# Patient Record
Sex: Male | Born: 1945
Health system: Southern US, Community
[De-identification: ages and names within clinical notes are randomized; demographics above are authoritative.]

## PROBLEM LIST (undated history)

## (undated) DIAGNOSIS — E118 Type 2 diabetes mellitus with unspecified complications: Secondary | ICD-10-CM

## (undated) DIAGNOSIS — M1990 Unspecified osteoarthritis, unspecified site: Secondary | ICD-10-CM

## (undated) DIAGNOSIS — R55 Syncope and collapse: Secondary | ICD-10-CM

## (undated) DIAGNOSIS — Z9981 Dependence on supplemental oxygen: Secondary | ICD-10-CM

## (undated) DIAGNOSIS — Z9289 Personal history of other medical treatment: Secondary | ICD-10-CM

## (undated) DIAGNOSIS — K219 Gastro-esophageal reflux disease without esophagitis: Secondary | ICD-10-CM

## (undated) DIAGNOSIS — Z9889 Other specified postprocedural states: Secondary | ICD-10-CM

## (undated) DIAGNOSIS — M109 Gout, unspecified: Secondary | ICD-10-CM

## (undated) DIAGNOSIS — E1140 Type 2 diabetes mellitus with diabetic neuropathy, unspecified: Secondary | ICD-10-CM

## (undated) DIAGNOSIS — I10 Essential (primary) hypertension: Secondary | ICD-10-CM

## (undated) DIAGNOSIS — E039 Hypothyroidism, unspecified: Secondary | ICD-10-CM

## (undated) DIAGNOSIS — R609 Edema, unspecified: Secondary | ICD-10-CM

## (undated) DIAGNOSIS — F329 Major depressive disorder, single episode, unspecified: Secondary | ICD-10-CM

## (undated) DIAGNOSIS — A770 Spotted fever due to Rickettsia rickettsii: Secondary | ICD-10-CM

## (undated) DIAGNOSIS — I5189 Other ill-defined heart diseases: Secondary | ICD-10-CM

## (undated) DIAGNOSIS — G4733 Obstructive sleep apnea (adult) (pediatric): Secondary | ICD-10-CM

## (undated) DIAGNOSIS — F32A Depression, unspecified: Secondary | ICD-10-CM

## (undated) DIAGNOSIS — N200 Calculus of kidney: Secondary | ICD-10-CM

## (undated) DIAGNOSIS — N281 Cyst of kidney, acquired: Secondary | ICD-10-CM

## (undated) DIAGNOSIS — D696 Thrombocytopenia, unspecified: Secondary | ICD-10-CM

## (undated) HISTORY — DX: Calculus of kidney: N20.0

## (undated) HISTORY — DX: Cyst of kidney, acquired: N28.1

## (undated) HISTORY — DX: Other ill-defined heart diseases: I51.89

## (undated) HISTORY — DX: Other specified postprocedural states: Z98.890

## (undated) HISTORY — DX: Type 2 diabetes mellitus with unspecified complications: E11.8

## (undated) HISTORY — DX: Spotted fever due to Rickettsia rickettsii: A77.0

## (undated) HISTORY — DX: Depression, unspecified: F32.A

## (undated) HISTORY — DX: Type 2 diabetes mellitus with diabetic neuropathy, unspecified: E11.40

## (undated) HISTORY — DX: Gout, unspecified: M10.9

## (undated) HISTORY — DX: Thrombocytopenia, unspecified: D69.6

## (undated) HISTORY — DX: Syncope and collapse: R55

## (undated) HISTORY — DX: Major depressive disorder, single episode, unspecified: F32.9

## (undated) HISTORY — DX: Obstructive sleep apnea (adult) (pediatric): G47.33

## (undated) HISTORY — PX: ANAL FISSURE REPAIR: SHX2312

## (undated) HISTORY — DX: Essential (primary) hypertension: I10

## (undated) HISTORY — DX: Personal history of other medical treatment: Z92.89

## (undated) HISTORY — PX: FINGER AMPUTATION: SHX636

---

## 1975-04-15 HISTORY — PX: BACK SURGERY: SHX140

## 1991-04-15 HISTORY — PX: KNEE SURGERY: SHX244

## 1996-04-14 DIAGNOSIS — Z9889 Other specified postprocedural states: Secondary | ICD-10-CM

## 1996-04-14 HISTORY — PX: SHOULDER SURGERY: SHX246

## 1996-04-14 HISTORY — PX: CARDIAC CATHETERIZATION: SHX172

## 1996-04-14 HISTORY — DX: Other specified postprocedural states: Z98.890

## 1997-12-08 ENCOUNTER — Encounter: Payer: Self-pay | Admitting: Internal Medicine

## 1997-12-08 ENCOUNTER — Ambulatory Visit (HOSPITAL_COMMUNITY): Admission: RE | Admit: 1997-12-08 | Discharge: 1997-12-08 | Payer: Self-pay | Admitting: Internal Medicine

## 2003-01-05 LAB — HM COLONOSCOPY

## 2006-06-24 ENCOUNTER — Ambulatory Visit (HOSPITAL_COMMUNITY): Admission: RE | Admit: 2006-06-24 | Discharge: 2006-06-24 | Payer: Self-pay | Admitting: Internal Medicine

## 2006-06-24 ENCOUNTER — Encounter: Payer: Self-pay | Admitting: Vascular Surgery

## 2006-06-24 ENCOUNTER — Ambulatory Visit: Payer: Self-pay | Admitting: Vascular Surgery

## 2008-07-11 ENCOUNTER — Ambulatory Visit (HOSPITAL_COMMUNITY): Admission: RE | Admit: 2008-07-11 | Discharge: 2008-07-11 | Payer: Self-pay | Admitting: Internal Medicine

## 2008-09-21 ENCOUNTER — Encounter: Admission: RE | Admit: 2008-09-21 | Discharge: 2008-11-13 | Payer: Self-pay | Admitting: Orthopaedic Surgery

## 2009-02-22 ENCOUNTER — Ambulatory Visit (HOSPITAL_COMMUNITY): Admission: RE | Admit: 2009-02-22 | Discharge: 2009-02-22 | Payer: Self-pay | Admitting: Internal Medicine

## 2009-11-21 ENCOUNTER — Encounter: Admission: RE | Admit: 2009-11-21 | Discharge: 2009-11-21 | Payer: Self-pay | Admitting: Internal Medicine

## 2010-05-23 LAB — HM DIABETES EYE EXAM

## 2010-10-29 ENCOUNTER — Other Ambulatory Visit: Payer: Self-pay | Admitting: Orthopaedic Surgery

## 2010-10-29 DIAGNOSIS — M545 Low back pain, unspecified: Secondary | ICD-10-CM

## 2010-10-31 ENCOUNTER — Ambulatory Visit
Admission: RE | Admit: 2010-10-31 | Discharge: 2010-10-31 | Disposition: A | Discharge status: Home / Self Care | Payer: Medicare Other | Source: Ambulatory Visit | Attending: Orthopaedic Surgery | Admitting: Orthopaedic Surgery

## 2010-10-31 DIAGNOSIS — M545 Low back pain, unspecified: Secondary | ICD-10-CM

## 2011-04-24 DIAGNOSIS — E291 Testicular hypofunction: Secondary | ICD-10-CM | POA: Diagnosis not present

## 2011-05-08 DIAGNOSIS — E291 Testicular hypofunction: Secondary | ICD-10-CM | POA: Diagnosis not present

## 2011-05-21 DIAGNOSIS — I10 Essential (primary) hypertension: Secondary | ICD-10-CM | POA: Diagnosis not present

## 2011-05-21 DIAGNOSIS — E782 Mixed hyperlipidemia: Secondary | ICD-10-CM | POA: Diagnosis not present

## 2011-05-21 DIAGNOSIS — E559 Vitamin D deficiency, unspecified: Secondary | ICD-10-CM | POA: Diagnosis not present

## 2011-05-21 DIAGNOSIS — Z79899 Other long term (current) drug therapy: Secondary | ICD-10-CM | POA: Diagnosis not present

## 2011-05-21 DIAGNOSIS — E119 Type 2 diabetes mellitus without complications: Secondary | ICD-10-CM | POA: Diagnosis not present

## 2011-06-04 DIAGNOSIS — E291 Testicular hypofunction: Secondary | ICD-10-CM | POA: Diagnosis not present

## 2011-06-19 DIAGNOSIS — E291 Testicular hypofunction: Secondary | ICD-10-CM | POA: Diagnosis not present

## 2011-07-03 DIAGNOSIS — E291 Testicular hypofunction: Secondary | ICD-10-CM | POA: Diagnosis not present

## 2011-07-17 DIAGNOSIS — E291 Testicular hypofunction: Secondary | ICD-10-CM | POA: Diagnosis not present

## 2011-07-31 DIAGNOSIS — E291 Testicular hypofunction: Secondary | ICD-10-CM | POA: Diagnosis not present

## 2011-08-14 DIAGNOSIS — E291 Testicular hypofunction: Secondary | ICD-10-CM | POA: Diagnosis not present

## 2011-09-02 DIAGNOSIS — Z79899 Other long term (current) drug therapy: Secondary | ICD-10-CM | POA: Diagnosis not present

## 2011-09-02 DIAGNOSIS — E119 Type 2 diabetes mellitus without complications: Secondary | ICD-10-CM | POA: Diagnosis not present

## 2011-09-02 DIAGNOSIS — E782 Mixed hyperlipidemia: Secondary | ICD-10-CM | POA: Diagnosis not present

## 2011-09-02 DIAGNOSIS — N529 Male erectile dysfunction, unspecified: Secondary | ICD-10-CM | POA: Diagnosis not present

## 2011-09-02 DIAGNOSIS — M109 Gout, unspecified: Secondary | ICD-10-CM | POA: Diagnosis not present

## 2011-09-02 DIAGNOSIS — E559 Vitamin D deficiency, unspecified: Secondary | ICD-10-CM | POA: Diagnosis not present

## 2011-09-02 DIAGNOSIS — I10 Essential (primary) hypertension: Secondary | ICD-10-CM | POA: Diagnosis not present

## 2011-09-18 DIAGNOSIS — E291 Testicular hypofunction: Secondary | ICD-10-CM | POA: Diagnosis not present

## 2011-10-01 DIAGNOSIS — E291 Testicular hypofunction: Secondary | ICD-10-CM | POA: Diagnosis not present

## 2011-10-01 DIAGNOSIS — E782 Mixed hyperlipidemia: Secondary | ICD-10-CM | POA: Diagnosis not present

## 2011-10-01 DIAGNOSIS — I10 Essential (primary) hypertension: Secondary | ICD-10-CM | POA: Diagnosis not present

## 2011-10-21 DIAGNOSIS — E291 Testicular hypofunction: Secondary | ICD-10-CM | POA: Diagnosis not present

## 2011-11-03 DIAGNOSIS — E291 Testicular hypofunction: Secondary | ICD-10-CM | POA: Diagnosis not present

## 2011-11-20 DIAGNOSIS — Z79899 Other long term (current) drug therapy: Secondary | ICD-10-CM | POA: Diagnosis not present

## 2011-11-20 DIAGNOSIS — Z23 Encounter for immunization: Secondary | ICD-10-CM | POA: Diagnosis not present

## 2011-11-20 DIAGNOSIS — Z125 Encounter for screening for malignant neoplasm of prostate: Secondary | ICD-10-CM | POA: Diagnosis not present

## 2011-11-20 DIAGNOSIS — E559 Vitamin D deficiency, unspecified: Secondary | ICD-10-CM | POA: Diagnosis not present

## 2011-11-20 DIAGNOSIS — E119 Type 2 diabetes mellitus without complications: Secondary | ICD-10-CM | POA: Diagnosis not present

## 2011-11-20 DIAGNOSIS — N529 Male erectile dysfunction, unspecified: Secondary | ICD-10-CM | POA: Diagnosis not present

## 2011-11-20 DIAGNOSIS — Z2089 Contact with and (suspected) exposure to other communicable diseases: Secondary | ICD-10-CM | POA: Diagnosis not present

## 2011-11-20 DIAGNOSIS — I10 Essential (primary) hypertension: Secondary | ICD-10-CM | POA: Diagnosis not present

## 2011-11-20 DIAGNOSIS — E291 Testicular hypofunction: Secondary | ICD-10-CM | POA: Diagnosis not present

## 2011-11-20 DIAGNOSIS — Z1212 Encounter for screening for malignant neoplasm of rectum: Secondary | ICD-10-CM | POA: Diagnosis not present

## 2011-11-20 DIAGNOSIS — E782 Mixed hyperlipidemia: Secondary | ICD-10-CM | POA: Diagnosis not present

## 2011-12-04 DIAGNOSIS — E291 Testicular hypofunction: Secondary | ICD-10-CM | POA: Diagnosis not present

## 2011-12-23 DIAGNOSIS — E291 Testicular hypofunction: Secondary | ICD-10-CM | POA: Diagnosis not present

## 2011-12-29 DIAGNOSIS — IMO0002 Reserved for concepts with insufficient information to code with codable children: Secondary | ICD-10-CM | POA: Diagnosis not present

## 2011-12-29 DIAGNOSIS — M48061 Spinal stenosis, lumbar region without neurogenic claudication: Secondary | ICD-10-CM | POA: Diagnosis not present

## 2012-01-06 DIAGNOSIS — E291 Testicular hypofunction: Secondary | ICD-10-CM | POA: Diagnosis not present

## 2012-01-20 DIAGNOSIS — I10 Essential (primary) hypertension: Secondary | ICD-10-CM | POA: Diagnosis not present

## 2012-01-20 DIAGNOSIS — E559 Vitamin D deficiency, unspecified: Secondary | ICD-10-CM | POA: Diagnosis not present

## 2012-01-20 DIAGNOSIS — Z79899 Other long term (current) drug therapy: Secondary | ICD-10-CM | POA: Diagnosis not present

## 2012-01-20 DIAGNOSIS — E291 Testicular hypofunction: Secondary | ICD-10-CM | POA: Diagnosis not present

## 2012-01-20 DIAGNOSIS — M109 Gout, unspecified: Secondary | ICD-10-CM | POA: Diagnosis not present

## 2012-01-20 DIAGNOSIS — Z23 Encounter for immunization: Secondary | ICD-10-CM | POA: Diagnosis not present

## 2012-01-20 DIAGNOSIS — E119 Type 2 diabetes mellitus without complications: Secondary | ICD-10-CM | POA: Diagnosis not present

## 2012-01-20 DIAGNOSIS — E782 Mixed hyperlipidemia: Secondary | ICD-10-CM | POA: Diagnosis not present

## 2012-01-23 DIAGNOSIS — E119 Type 2 diabetes mellitus without complications: Secondary | ICD-10-CM | POA: Diagnosis not present

## 2012-01-23 DIAGNOSIS — G609 Hereditary and idiopathic neuropathy, unspecified: Secondary | ICD-10-CM | POA: Diagnosis not present

## 2012-01-23 DIAGNOSIS — E039 Hypothyroidism, unspecified: Secondary | ICD-10-CM | POA: Diagnosis not present

## 2012-01-23 DIAGNOSIS — I10 Essential (primary) hypertension: Secondary | ICD-10-CM | POA: Diagnosis not present

## 2012-01-29 DIAGNOSIS — G5600 Carpal tunnel syndrome, unspecified upper limb: Secondary | ICD-10-CM | POA: Diagnosis not present

## 2012-01-29 DIAGNOSIS — G609 Hereditary and idiopathic neuropathy, unspecified: Secondary | ICD-10-CM | POA: Diagnosis not present

## 2012-01-29 DIAGNOSIS — R209 Unspecified disturbances of skin sensation: Secondary | ICD-10-CM | POA: Diagnosis not present

## 2012-02-03 DIAGNOSIS — E291 Testicular hypofunction: Secondary | ICD-10-CM | POA: Diagnosis not present

## 2012-02-16 DIAGNOSIS — E291 Testicular hypofunction: Secondary | ICD-10-CM | POA: Diagnosis not present

## 2012-02-16 DIAGNOSIS — I10 Essential (primary) hypertension: Secondary | ICD-10-CM | POA: Diagnosis not present

## 2012-03-01 DIAGNOSIS — E291 Testicular hypofunction: Secondary | ICD-10-CM | POA: Diagnosis not present

## 2012-03-15 DIAGNOSIS — J0100 Acute maxillary sinusitis, unspecified: Secondary | ICD-10-CM | POA: Diagnosis not present

## 2012-03-23 ENCOUNTER — Emergency Department: Payer: Self-pay | Admitting: Emergency Medicine

## 2012-03-23 DIAGNOSIS — N134 Hydroureter: Secondary | ICD-10-CM | POA: Diagnosis not present

## 2012-03-23 DIAGNOSIS — N1330 Unspecified hydronephrosis: Secondary | ICD-10-CM | POA: Diagnosis not present

## 2012-03-23 DIAGNOSIS — J029 Acute pharyngitis, unspecified: Secondary | ICD-10-CM | POA: Diagnosis not present

## 2012-03-23 DIAGNOSIS — R0602 Shortness of breath: Secondary | ICD-10-CM | POA: Diagnosis not present

## 2012-03-23 DIAGNOSIS — I10 Essential (primary) hypertension: Secondary | ICD-10-CM | POA: Diagnosis not present

## 2012-03-23 DIAGNOSIS — R110 Nausea: Secondary | ICD-10-CM | POA: Diagnosis not present

## 2012-03-23 DIAGNOSIS — E039 Hypothyroidism, unspecified: Secondary | ICD-10-CM | POA: Diagnosis not present

## 2012-03-23 DIAGNOSIS — N200 Calculus of kidney: Secondary | ICD-10-CM | POA: Diagnosis not present

## 2012-03-23 DIAGNOSIS — E119 Type 2 diabetes mellitus without complications: Secondary | ICD-10-CM | POA: Diagnosis not present

## 2012-03-23 DIAGNOSIS — Z79899 Other long term (current) drug therapy: Secondary | ICD-10-CM | POA: Diagnosis not present

## 2012-03-24 DIAGNOSIS — N1330 Unspecified hydronephrosis: Secondary | ICD-10-CM | POA: Diagnosis not present

## 2012-03-24 DIAGNOSIS — N134 Hydroureter: Secondary | ICD-10-CM | POA: Diagnosis not present

## 2012-03-24 DIAGNOSIS — N201 Calculus of ureter: Secondary | ICD-10-CM | POA: Diagnosis not present

## 2012-03-24 DIAGNOSIS — N200 Calculus of kidney: Secondary | ICD-10-CM | POA: Diagnosis not present

## 2012-03-24 LAB — CBC
HCT: 42 % (ref 40.0–52.0)
HGB: 13.7 g/dL (ref 13.0–18.0)
MCH: 29.2 pg (ref 26.0–34.0)
MCHC: 32.6 g/dL (ref 32.0–36.0)
MCV: 90 fL (ref 80–100)
Platelet: 173 10*3/uL (ref 150–440)
RBC: 4.69 10*6/uL (ref 4.40–5.90)
RDW: 15.2 % — ABNORMAL HIGH (ref 11.5–14.5)
WBC: 8.7 10*3/uL (ref 3.8–10.6)

## 2012-03-24 LAB — COMPREHENSIVE METABOLIC PANEL
Albumin: 4 g/dL (ref 3.4–5.0)
Alkaline Phosphatase: 72 U/L (ref 50–136)
Anion Gap: 8 (ref 7–16)
BUN: 21 mg/dL — ABNORMAL HIGH (ref 7–18)
Bilirubin,Total: 0.8 mg/dL (ref 0.2–1.0)
Calcium, Total: 9.6 mg/dL (ref 8.5–10.1)
Chloride: 102 mmol/L (ref 98–107)
Co2: 29 mmol/L (ref 21–32)
Creatinine: 2.08 mg/dL — ABNORMAL HIGH (ref 0.60–1.30)
EGFR (African American): 37 — ABNORMAL LOW
EGFR (Non-African Amer.): 32 — ABNORMAL LOW
Glucose: 199 mg/dL — ABNORMAL HIGH (ref 65–99)
Osmolality: 286 (ref 275–301)
Potassium: 4.2 mmol/L (ref 3.5–5.1)
SGOT(AST): 25 U/L (ref 15–37)
SGPT (ALT): 24 U/L (ref 12–78)
Sodium: 139 mmol/L (ref 136–145)
Total Protein: 7 g/dL (ref 6.4–8.2)

## 2012-03-24 LAB — URINALYSIS, COMPLETE
Bilirubin,UR: NEGATIVE
Blood: NEGATIVE
Glucose,UR: 50 mg/dL (ref 0–75)
Nitrite: NEGATIVE
Ph: 6 (ref 4.5–8.0)
Protein: NEGATIVE
RBC,UR: 2 /HPF (ref 0–5)
Specific Gravity: 1.029 (ref 1.003–1.030)
Squamous Epithelial: NONE SEEN
WBC UR: 3 /HPF (ref 0–5)

## 2012-03-24 LAB — LIPASE, BLOOD: Lipase: 388 U/L (ref 73–393)

## 2012-03-30 DIAGNOSIS — E291 Testicular hypofunction: Secondary | ICD-10-CM | POA: Diagnosis not present

## 2012-04-27 DIAGNOSIS — E291 Testicular hypofunction: Secondary | ICD-10-CM | POA: Diagnosis not present

## 2012-04-27 DIAGNOSIS — N529 Male erectile dysfunction, unspecified: Secondary | ICD-10-CM | POA: Diagnosis not present

## 2012-05-10 DIAGNOSIS — E119 Type 2 diabetes mellitus without complications: Secondary | ICD-10-CM | POA: Diagnosis not present

## 2012-05-10 DIAGNOSIS — G609 Hereditary and idiopathic neuropathy, unspecified: Secondary | ICD-10-CM | POA: Diagnosis not present

## 2012-05-10 DIAGNOSIS — I10 Essential (primary) hypertension: Secondary | ICD-10-CM | POA: Diagnosis not present

## 2012-06-04 DIAGNOSIS — E119 Type 2 diabetes mellitus without complications: Secondary | ICD-10-CM | POA: Diagnosis not present

## 2012-06-04 DIAGNOSIS — N529 Male erectile dysfunction, unspecified: Secondary | ICD-10-CM | POA: Diagnosis not present

## 2012-06-04 DIAGNOSIS — E559 Vitamin D deficiency, unspecified: Secondary | ICD-10-CM | POA: Diagnosis not present

## 2012-06-04 DIAGNOSIS — Z79899 Other long term (current) drug therapy: Secondary | ICD-10-CM | POA: Diagnosis not present

## 2012-06-04 DIAGNOSIS — M109 Gout, unspecified: Secondary | ICD-10-CM | POA: Diagnosis not present

## 2012-06-04 DIAGNOSIS — I10 Essential (primary) hypertension: Secondary | ICD-10-CM | POA: Diagnosis not present

## 2012-06-04 DIAGNOSIS — E782 Mixed hyperlipidemia: Secondary | ICD-10-CM | POA: Diagnosis not present

## 2012-06-17 DIAGNOSIS — E291 Testicular hypofunction: Secondary | ICD-10-CM | POA: Diagnosis not present

## 2012-07-20 DIAGNOSIS — E291 Testicular hypofunction: Secondary | ICD-10-CM | POA: Diagnosis not present

## 2012-07-20 DIAGNOSIS — N529 Male erectile dysfunction, unspecified: Secondary | ICD-10-CM | POA: Diagnosis not present

## 2012-08-13 ENCOUNTER — Telehealth: Payer: Self-pay | Admitting: Neurology

## 2012-08-18 DIAGNOSIS — E109 Type 1 diabetes mellitus without complications: Secondary | ICD-10-CM | POA: Diagnosis not present

## 2012-08-18 DIAGNOSIS — E291 Testicular hypofunction: Secondary | ICD-10-CM | POA: Diagnosis not present

## 2012-08-18 DIAGNOSIS — N529 Male erectile dysfunction, unspecified: Secondary | ICD-10-CM | POA: Diagnosis not present

## 2012-08-31 DIAGNOSIS — M48061 Spinal stenosis, lumbar region without neurogenic claudication: Secondary | ICD-10-CM | POA: Diagnosis not present

## 2012-09-09 ENCOUNTER — Ambulatory Visit: Payer: Self-pay | Admitting: Nurse Practitioner

## 2012-09-14 ENCOUNTER — Encounter: Payer: Self-pay | Admitting: Nurse Practitioner

## 2012-09-15 ENCOUNTER — Ambulatory Visit (INDEPENDENT_AMBULATORY_CARE_PROVIDER_SITE_OTHER): Payer: Medicare Other | Admitting: Nurse Practitioner

## 2012-09-15 ENCOUNTER — Encounter: Payer: Self-pay | Admitting: Nurse Practitioner

## 2012-09-15 VITALS — BP 122/82 | HR 69 | Ht 72.0 in | Wt 271.0 lb

## 2012-09-15 DIAGNOSIS — G609 Hereditary and idiopathic neuropathy, unspecified: Secondary | ICD-10-CM | POA: Diagnosis not present

## 2012-09-15 DIAGNOSIS — E1059 Type 1 diabetes mellitus with other circulatory complications: Secondary | ICD-10-CM | POA: Diagnosis not present

## 2012-09-15 DIAGNOSIS — E1051 Type 1 diabetes mellitus with diabetic peripheral angiopathy without gangrene: Secondary | ICD-10-CM | POA: Insufficient documentation

## 2012-09-15 MED ORDER — PREGABALIN 100 MG PO CAPS: 100.0000 mg | ORAL_CAPSULE | Freq: Three times a day (TID) | ORAL | Status: DC

## 2012-09-15 NOTE — Patient Instructions (Addendum)
Will increase Lyrica 100 mg 3 times daily  continue to keep blood sugars in good control as possible Followup in 6 months

## 2012-09-15 NOTE — Progress Notes (Signed)
HPI: Patient returns for followup after his last visit 05/10/2012. He has a history of bilateral feet and hand paresthesias. Also past medical history of gout, obesity, obstructive sleep apnea but could not tolerate CPAP and diabetes for 3-1/2-4 years. He has no distal leg weakness, history of low back pain and occasional right lower extremity shooting pain. He was  placed on Lyrica and currently taking 75 mg 3 times daily. His pain is not completely controlled but much better than when last seen. EMG nerve conduction with evidence of length dependent mild axonal peripheral neuropathy. There is no evidence of right lumbosacral radiculopathy. Patient has had side effects to gabapentin in the past which cause swelling and a rash. He did not find the cream to be beneficial.   ROS:  Fatigue, weight gain, shortness of breath, flushing, insomnia depression and anxiety  Physical Exam General: well developed, obese male  seated, in no evident distress Head: head normocephalic and atraumatic. Oropharynx benign Neck: supple with no carotid  bruits Cardiovascular: regular rate and rhythm, no murmurs  Neurologic Exam Mental Status: Awake and fully alert. Oriented to place and time. Follows all commands. Speech and language normal.   Cranial Nerves: Pupils equal, briskly reactive to light. Extraocular movements full without nystagmus. Visual fields full to confrontation. Hearing intact and symmetric to finger snap. Facial sensation intact. Face, tongue, palate move normally and symmetrically. Neck flexion and extension normal.  Motor: Normal bulk and tone. Normal strength in all tested extremity muscles.No focal weakness Sensory.: Length dependent decreased light touch, pinprick to the knees bilaterally preserved toe proprioception and decreased vibratory to ankles   Coordination: Rapid alternating movements normal in all extremities. Finger-to-nose and heel-to-shin performed accurately bilaterally. Gait and  Station: Arises from chair without difficulty. Stance is normal.  Moderate difficulty with  heel, toe and tandem walk. No assistive device Reflexes: 2+ and symmetric except absent Achilles. Toes downgoing.     ASSESSMENT: Four-year history of diabetes now presenting with 3-1/2 year history of bilateral feet and hand paresthesias Most consistent with diabetic peripheral neuropathy EMG nerve conduction with evidence of length dependent mild axonal peripheral neuropathy without evidence of right lumbosacral radiculopathy Laboratory evaluation to rule out other etiologies were of normal     PLAN: Will increase Lyrica 100 mg 3 times daily He will followup in 6 months  Dennie Bible, GNP-BC APRN

## 2012-11-25 DIAGNOSIS — I10 Essential (primary) hypertension: Secondary | ICD-10-CM | POA: Diagnosis not present

## 2012-11-25 DIAGNOSIS — Z1212 Encounter for screening for malignant neoplasm of rectum: Secondary | ICD-10-CM | POA: Diagnosis not present

## 2012-11-25 DIAGNOSIS — N529 Male erectile dysfunction, unspecified: Secondary | ICD-10-CM | POA: Diagnosis not present

## 2012-11-25 DIAGNOSIS — E782 Mixed hyperlipidemia: Secondary | ICD-10-CM | POA: Diagnosis not present

## 2012-11-25 DIAGNOSIS — E559 Vitamin D deficiency, unspecified: Secondary | ICD-10-CM | POA: Diagnosis not present

## 2012-11-25 DIAGNOSIS — Z79899 Other long term (current) drug therapy: Secondary | ICD-10-CM | POA: Diagnosis not present

## 2012-11-25 DIAGNOSIS — E538 Deficiency of other specified B group vitamins: Secondary | ICD-10-CM | POA: Diagnosis not present

## 2012-11-25 DIAGNOSIS — E119 Type 2 diabetes mellitus without complications: Secondary | ICD-10-CM | POA: Diagnosis not present

## 2012-11-29 ENCOUNTER — Encounter: Payer: Self-pay | Admitting: Internal Medicine

## 2012-12-01 ENCOUNTER — Ambulatory Visit: Payer: Self-pay | Admitting: Nurse Practitioner

## 2012-12-02 LAB — TROPONIN I: Troponin-I: <0.02 ng/mL

## 2012-12-02 LAB — COMPREHENSIVE METABOLIC PANEL
Albumin: 3.7 g/dL (ref 3.4–5.0)
Alkaline Phosphatase: 64 U/L (ref 50–136)
Anion Gap: 9 (ref 7–16)
BUN: 50 mg/dL — ABNORMAL HIGH (ref 7–18)
Bilirubin,Total: 0.5 mg/dL (ref 0.2–1.0)
Calcium, Total: 9.1 mg/dL (ref 8.5–10.1)
Chloride: 100 mmol/L (ref 98–107)
Co2: 25 mmol/L (ref 21–32)
Creatinine: 3.95 mg/dL — ABNORMAL HIGH (ref 0.60–1.30)
EGFR (African American): 17 — ABNORMAL LOW
EGFR (Non-African Amer.): 15 — ABNORMAL LOW
Glucose: 161 mg/dL — ABNORMAL HIGH (ref 65–99)
Osmolality: 285 (ref 275–301)
Potassium: 4.1 mmol/L (ref 3.5–5.1)
SGOT(AST): 81 U/L — ABNORMAL HIGH (ref 15–37)
SGPT (ALT): 74 U/L (ref 12–78)
Sodium: 134 mmol/L — ABNORMAL LOW (ref 136–145)
Total Protein: 6.8 g/dL (ref 6.4–8.2)

## 2012-12-02 LAB — CBC
HCT: 34.4 % — ABNORMAL LOW (ref 40.0–52.0)
HGB: 11.9 g/dL — ABNORMAL LOW (ref 13.0–18.0)
MCH: 31.5 pg (ref 26.0–34.0)
MCHC: 34.6 g/dL (ref 32.0–36.0)
MCV: 91 fL (ref 80–100)
Platelet: 139 10*3/uL — ABNORMAL LOW (ref 150–440)
RBC: 3.77 10*6/uL — ABNORMAL LOW (ref 4.40–5.90)
RDW: 14.5 % (ref 11.5–14.5)
WBC: 7.4 10*3/uL (ref 3.8–10.6)

## 2012-12-03 ENCOUNTER — Inpatient Hospital Stay: Payer: Self-pay | Admitting: Family Medicine

## 2012-12-03 DIAGNOSIS — I129 Hypertensive chronic kidney disease with stage 1 through stage 4 chronic kidney disease, or unspecified chronic kidney disease: Secondary | ICD-10-CM | POA: Diagnosis not present

## 2012-12-03 DIAGNOSIS — I959 Hypotension, unspecified: Secondary | ICD-10-CM | POA: Diagnosis not present

## 2012-12-03 DIAGNOSIS — E119 Type 2 diabetes mellitus without complications: Secondary | ICD-10-CM | POA: Diagnosis not present

## 2012-12-03 DIAGNOSIS — Z87442 Personal history of urinary calculi: Secondary | ICD-10-CM | POA: Diagnosis not present

## 2012-12-03 DIAGNOSIS — N183 Chronic kidney disease, stage 3 unspecified: Secondary | ICD-10-CM | POA: Diagnosis not present

## 2012-12-03 DIAGNOSIS — E039 Hypothyroidism, unspecified: Secondary | ICD-10-CM | POA: Diagnosis present

## 2012-12-03 DIAGNOSIS — R578 Other shock: Secondary | ICD-10-CM | POA: Diagnosis present

## 2012-12-03 DIAGNOSIS — N289 Disorder of kidney and ureter, unspecified: Secondary | ICD-10-CM | POA: Diagnosis not present

## 2012-12-03 DIAGNOSIS — R5381 Other malaise: Secondary | ICD-10-CM | POA: Diagnosis not present

## 2012-12-03 DIAGNOSIS — E1149 Type 2 diabetes mellitus with other diabetic neurological complication: Secondary | ICD-10-CM | POA: Diagnosis present

## 2012-12-03 DIAGNOSIS — N179 Acute kidney failure, unspecified: Secondary | ICD-10-CM | POA: Diagnosis not present

## 2012-12-03 DIAGNOSIS — G8929 Other chronic pain: Secondary | ICD-10-CM | POA: Diagnosis present

## 2012-12-03 DIAGNOSIS — R42 Dizziness and giddiness: Secondary | ICD-10-CM | POA: Diagnosis not present

## 2012-12-03 DIAGNOSIS — R55 Syncope and collapse: Secondary | ICD-10-CM | POA: Diagnosis not present

## 2012-12-03 DIAGNOSIS — M549 Dorsalgia, unspecified: Secondary | ICD-10-CM | POA: Diagnosis present

## 2012-12-03 DIAGNOSIS — M109 Gout, unspecified: Secondary | ICD-10-CM | POA: Diagnosis present

## 2012-12-03 DIAGNOSIS — I951 Orthostatic hypotension: Secondary | ICD-10-CM | POA: Diagnosis not present

## 2012-12-03 DIAGNOSIS — E1142 Type 2 diabetes mellitus with diabetic polyneuropathy: Secondary | ICD-10-CM | POA: Diagnosis present

## 2012-12-03 DIAGNOSIS — R609 Edema, unspecified: Secondary | ICD-10-CM | POA: Diagnosis present

## 2012-12-03 DIAGNOSIS — E860 Dehydration: Secondary | ICD-10-CM | POA: Diagnosis not present

## 2012-12-03 LAB — BASIC METABOLIC PANEL
Anion Gap: 6 — ABNORMAL LOW (ref 7–16)
BUN: 48 mg/dL — ABNORMAL HIGH (ref 7–18)
Calcium, Total: 8.7 mg/dL (ref 8.5–10.1)
Chloride: 103 mmol/L (ref 98–107)
Co2: 27 mmol/L (ref 21–32)
Creatinine: 3.33 mg/dL — ABNORMAL HIGH (ref 0.60–1.30)
EGFR (African American): 21 — ABNORMAL LOW
EGFR (Non-African Amer.): 18 — ABNORMAL LOW
Glucose: 178 mg/dL — ABNORMAL HIGH (ref 65–99)
Osmolality: 289 (ref 275–301)
Potassium: 4 mmol/L (ref 3.5–5.1)
Sodium: 136 mmol/L (ref 136–145)

## 2012-12-03 LAB — SODIUM, URINE, RANDOM: Sodium, Urine Random: 37 mmol/L (ref 20–110)

## 2012-12-03 LAB — PROTEIN, URINE, RANDOM: Protein, Random Urine: 5 mg/dL (ref 0–12)

## 2012-12-03 LAB — CREATININE, URINE, RANDOM: Creatinine, Urine Random: 55.5 mg/dL (ref 30.0–125.0)

## 2012-12-04 LAB — BASIC METABOLIC PANEL
Anion Gap: 6 — ABNORMAL LOW (ref 7–16)
BUN: 32 mg/dL — ABNORMAL HIGH (ref 7–18)
Calcium, Total: 9.1 mg/dL (ref 8.5–10.1)
Chloride: 110 mmol/L — ABNORMAL HIGH (ref 98–107)
Co2: 26 mmol/L (ref 21–32)
Creatinine: 2.33 mg/dL — ABNORMAL HIGH (ref 0.60–1.30)
EGFR (African American): 32 — ABNORMAL LOW
EGFR (Non-African Amer.): 28 — ABNORMAL LOW
Glucose: 116 mg/dL — ABNORMAL HIGH (ref 65–99)
Osmolality: 291 (ref 275–301)
Potassium: 4.4 mmol/L (ref 3.5–5.1)
Sodium: 142 mmol/L (ref 136–145)

## 2012-12-04 LAB — CBC WITH DIFFERENTIAL/PLATELET
Basophil #: 0.1 10*3/uL (ref 0.0–0.1)
Basophil %: 1.3 %
Eosinophil #: 0.3 10*3/uL (ref 0.0–0.7)
Eosinophil %: 6.2 %
HCT: 30.2 % — ABNORMAL LOW (ref 40.0–52.0)
HGB: 10.3 g/dL — ABNORMAL LOW (ref 13.0–18.0)
Lymphocyte #: 1.6 10*3/uL (ref 1.0–3.6)
Lymphocyte %: 34 %
MCH: 31.4 pg (ref 26.0–34.0)
MCHC: 34.2 g/dL (ref 32.0–36.0)
MCV: 92 fL (ref 80–100)
Monocyte #: 0.4 x10 3/mm (ref 0.2–1.0)
Monocyte %: 7.6 %
Neutrophil #: 2.5 10*3/uL (ref 1.4–6.5)
Neutrophil %: 50.9 %
Platelet: 132 10*3/uL — ABNORMAL LOW (ref 150–440)
RBC: 3.3 10*6/uL — ABNORMAL LOW (ref 4.40–5.90)
RDW: 14.8 % — ABNORMAL HIGH (ref 11.5–14.5)
WBC: 4.8 10*3/uL (ref 3.8–10.6)

## 2012-12-04 LAB — HEMOGLOBIN A1C: Hemoglobin A1C: 10.2 % — ABNORMAL HIGH (ref 4.2–6.3)

## 2013-01-04 ENCOUNTER — Ambulatory Visit: Payer: Medicare Other | Admitting: Internal Medicine

## 2013-01-04 ENCOUNTER — Ambulatory Visit (INDEPENDENT_AMBULATORY_CARE_PROVIDER_SITE_OTHER): Payer: Medicare Other | Admitting: Internal Medicine

## 2013-01-04 ENCOUNTER — Encounter: Payer: Self-pay | Admitting: Internal Medicine

## 2013-01-04 VITALS — BP 112/78 | HR 66 | Temp 98.3°F | Ht 70.5 in | Wt 268.0 lb

## 2013-01-04 DIAGNOSIS — E039 Hypothyroidism, unspecified: Secondary | ICD-10-CM | POA: Insufficient documentation

## 2013-01-04 DIAGNOSIS — E1140 Type 2 diabetes mellitus with diabetic neuropathy, unspecified: Secondary | ICD-10-CM

## 2013-01-04 DIAGNOSIS — Z23 Encounter for immunization: Secondary | ICD-10-CM

## 2013-01-04 DIAGNOSIS — N189 Chronic kidney disease, unspecified: Secondary | ICD-10-CM | POA: Diagnosis not present

## 2013-01-04 DIAGNOSIS — E1149 Type 2 diabetes mellitus with other diabetic neurological complication: Secondary | ICD-10-CM | POA: Diagnosis not present

## 2013-01-04 DIAGNOSIS — E1142 Type 2 diabetes mellitus with diabetic polyneuropathy: Secondary | ICD-10-CM | POA: Diagnosis not present

## 2013-01-04 DIAGNOSIS — G4733 Obstructive sleep apnea (adult) (pediatric): Secondary | ICD-10-CM

## 2013-01-04 DIAGNOSIS — M549 Dorsalgia, unspecified: Secondary | ICD-10-CM

## 2013-01-04 DIAGNOSIS — E669 Obesity, unspecified: Secondary | ICD-10-CM

## 2013-01-04 DIAGNOSIS — M109 Gout, unspecified: Secondary | ICD-10-CM

## 2013-01-04 DIAGNOSIS — E119 Type 2 diabetes mellitus without complications: Secondary | ICD-10-CM

## 2013-01-04 DIAGNOSIS — G8929 Other chronic pain: Secondary | ICD-10-CM

## 2013-01-04 MED ORDER — GLIMEPIRIDE 2 MG PO TABS: 2.0000 mg | ORAL_TABLET | Freq: Two times a day (BID) | ORAL | Status: DC

## 2013-01-05 DIAGNOSIS — N179 Acute kidney failure, unspecified: Secondary | ICD-10-CM | POA: Diagnosis not present

## 2013-01-05 DIAGNOSIS — E669 Obesity, unspecified: Secondary | ICD-10-CM | POA: Insufficient documentation

## 2013-01-05 DIAGNOSIS — M109 Gout, unspecified: Secondary | ICD-10-CM | POA: Insufficient documentation

## 2013-01-05 DIAGNOSIS — G4733 Obstructive sleep apnea (adult) (pediatric): Secondary | ICD-10-CM | POA: Insufficient documentation

## 2013-01-05 DIAGNOSIS — N183 Chronic kidney disease, stage 3 unspecified: Secondary | ICD-10-CM | POA: Diagnosis not present

## 2013-01-05 DIAGNOSIS — E1140 Type 2 diabetes mellitus with diabetic neuropathy, unspecified: Secondary | ICD-10-CM | POA: Insufficient documentation

## 2013-01-05 DIAGNOSIS — D631 Anemia in chronic kidney disease: Secondary | ICD-10-CM | POA: Diagnosis not present

## 2013-01-05 DIAGNOSIS — G8929 Other chronic pain: Secondary | ICD-10-CM | POA: Insufficient documentation

## 2013-01-05 DIAGNOSIS — I10 Essential (primary) hypertension: Secondary | ICD-10-CM | POA: Diagnosis not present

## 2013-01-05 LAB — COMPREHENSIVE METABOLIC PANEL
ALT: 42 U/L (ref 0–53)
AST: 44 U/L — ABNORMAL HIGH (ref 0–37)
Albumin: 4 g/dL (ref 3.5–5.2)
Alkaline Phosphatase: 46 U/L (ref 39–117)
BUN: 26 mg/dL — ABNORMAL HIGH (ref 6–23)
CO2: 27 mEq/L (ref 19–32)
Calcium: 9.6 mg/dL (ref 8.4–10.5)
Chloride: 105 mEq/L (ref 96–112)
Creatinine, Ser: 2 mg/dL — ABNORMAL HIGH (ref 0.4–1.5)
GFR: 36.16 mL/min — ABNORMAL LOW (ref >60.00)
Glucose, Bld: 162 mg/dL — ABNORMAL HIGH (ref 70–99)
Potassium: 4.3 mEq/L (ref 3.5–5.1)
Sodium: 140 mEq/L (ref 135–145)
Total Bilirubin: 0.8 mg/dL (ref 0.3–1.2)
Total Protein: 6.7 g/dL (ref 6.0–8.3)

## 2013-01-05 LAB — LIPID PANEL
Cholesterol: 106 mg/dL (ref 0–200)
HDL: 34.1 mg/dL — ABNORMAL LOW (ref >39.00)
LDL Cholesterol: 39 mg/dL (ref 0–99)
Total CHOL/HDL Ratio: 3
Triglycerides: 164 mg/dL — ABNORMAL HIGH (ref 0.0–149.0)
VLDL: 32.8 mg/dL (ref 0.0–40.0)

## 2013-01-05 LAB — MICROALBUMIN / CREATININE URINE RATIO
Creatinine,U: 48 mg/dL
Microalb Creat Ratio: 0.2 mg/g (ref 0.0–30.0)
Microalb, Ur: 0.1 mg/dL (ref 0.0–1.9)

## 2013-01-05 LAB — HEMOGLOBIN A1C: Hgb A1c MFr Bld: 7.7 % — ABNORMAL HIGH (ref 4.6–6.5)

## 2013-01-05 LAB — TSH: TSH: 0.73 u[IU]/mL (ref 0.35–5.50)

## 2013-01-05 NOTE — Progress Notes (Signed)
Subjective:    Patient ID: Douglas [name], male    DOB: 27-Dec-1945, 67 y.o.   MRN: [id number]  HPI 67 year old male with history of diabetes, obesity, hypertension, hypothyroidism, chronic back pain secondary to degenerative disease and spinal stenosis, gout, peripheral neuropathy presents to establish care. He reports that he was recently hospitalized for hypotension and found to have declining renal function and elevated blood sugars. During his hospital stay, he was taken off ARB and metformin. He was given IV fluids. He reports that symptoms rapidly improved.  In regards to diabetes, he did not bring record of his blood sugars today. Last A1c was 10% during hospitalization. He reports compliance with glyburide. Complications of diabetes include peripheral neuropathy. He is followed by a neurologist and has been taking Lyrica with some improvement in burning pain in his hands and feet.  In regards to chronic back pain, he is followed by pain management and is status post epidural steroid injections. He reports minimal improvement with this.  During his hospitalization he was found to have declining kidney function. Creatinine was 2.57. He is scheduled for evaluation with nephrology.  Outpatient Prescriptions Prior to Visit  Medication Sig Dispense Refill  . allopurinol (ZYLOPRIM) 300 MG tablet 300 mg daily.      Marland Kitchen ALPRAZolam (XANAX) 1 MG tablet Take 0.5 mg by mouth at bedtime as needed for sleep (1/2 tab at night).       . doxazosin (CARDURA) 8 MG tablet Take 8 mg by mouth at bedtime. Taking 1/2 tablet at night      . furosemide (LASIX) 40 MG tablet Take 40 mg by mouth daily.      . lansoprazole (PREVACID) 30 MG capsule Take 30 mg by mouth daily.      Marland Kitchen levothyroxine (SYNTHROID, LEVOTHROID) 100 MCG tablet Take 100 mcg by mouth daily before breakfast. Taking 100 mcg everyday except Monday and Wednesday take 200 mcg      . nebivolol (BYSTOLIC) 5 MG tablet Take 5 mg by mouth daily.       .  pregabalin (LYRICA) 100 MG capsule Take 1 capsule (100 mg total) by mouth 3 (three) times daily.  90 capsule  5  . simvastatin (ZOCOR) 40 MG tablet 40 mg daily.      . indomethacin (INDOCIN) 50 MG capsule Take 50 mg by mouth daily.      Marland Kitchen HYDROcodone-acetaminophen (NORCO/VICODIN) 5-325 MG per tablet Take 1 tablet by mouth every 6 (six) hours as needed for pain.      . Amitriptyline & Diet Manage Pr (SENTRAVIL PM-25) 25 MG MISC Take 12.5 mg by mouth at bedtime.       . metFORMIN (GLUCOPHAGE-XR) 500 MG 24 hr tablet Take 500 mg by mouth 3 (three) times daily.       Marland Kitchen olmesartan (BENICAR) 20 MG tablet Take 10 mg by mouth daily.       . tamoxifen (NOLVADEX) 20 MG tablet 20 mg daily.       No facility-administered medications prior to visit.   BP 112/78  Pulse 66  Temp(Src) 98.3 F (36.8 C) (Oral)  Ht 5' 10.5" (1.791 m)  Wt 268 lb (121.564 kg)  BMI 37.9 kg/m2  SpO2 96%  Review of Systems  Constitutional: Positive for fatigue. Negative for fever, chills, activity change, appetite change and unexpected weight change.  Eyes: Negative for visual disturbance.  Respiratory: Negative for cough and shortness of breath.   Cardiovascular: Negative for chest pain, palpitations and leg swelling.  Gastrointestinal: Negative for abdominal pain and abdominal distention.  Genitourinary: Negative for dysuria, urgency and difficulty urinating.  Musculoskeletal: Positive for myalgias, back pain and arthralgias. Negative for gait problem.  Skin: Negative for color change and rash.  Neurological: Positive for numbness. Negative for dizziness, tremors, weakness and headaches.  Hematological: Negative for adenopathy.  Psychiatric/Behavioral: Negative for sleep disturbance and dysphoric mood. The patient is not nervous/anxious.        Objective:   Physical Exam  Constitutional: He is oriented to person, place, and time. He appears well-developed and well-nourished. No distress.  HENT:  Head: Normocephalic  and atraumatic.  Right Ear: External ear normal.  Left Ear: External ear normal.  Nose: Nose normal.  Mouth/Throat: Oropharynx is clear and moist. No oropharyngeal exudate.  Eyes: Conjunctivae and EOM are normal. Pupils are equal, round, and reactive to light. Right eye exhibits no discharge. Left eye exhibits no discharge. No scleral icterus.  Neck: Normal range of motion. Neck supple. No tracheal deviation present. No thyromegaly present.  Cardiovascular: Normal rate, regular rhythm and normal heart sounds.  Exam reveals no gallop and no friction rub.   No murmur heard. Pulmonary/Chest: Effort normal and breath sounds normal. No respiratory distress. He has no wheezes. He has no rales. He exhibits no tenderness.  Abdominal: Soft. Bowel sounds are normal. He exhibits no distension. There is no tenderness.  Musculoskeletal: Normal range of motion. He exhibits no edema.  Lymphadenopathy:    He has no cervical adenopathy.  Neurological: He is alert and oriented to person, place, and time. No cranial nerve deficit. Coordination normal.  Skin: Skin is warm and dry. No rash noted. He is not diaphoretic. No erythema. No pallor.  Psychiatric: He has a normal mood and affect. His behavior is normal. Judgment and thought content normal.          Assessment & Plan:

## 2013-01-05 NOTE — Assessment & Plan Note (Signed)
Secondary to DJD and spinal stenosis. Reviewed MRI lumbar spine from 2012. Will continue to follow with [facility] for pain management. Will request notes on evaluation.

## 2013-01-05 NOTE — Assessment & Plan Note (Signed)
Recent A1c based on outside labs 10%. Will recheck A1c today. Continue off Metformin because of CKD. If A1c persistently elevated, will plan to add insulin. Follow up 1 week.

## 2013-01-05 NOTE — Assessment & Plan Note (Addendum)
Recent Cr 2.57 on labs. Discussed significant decline in renal function with pt. Likely secondary to DM. Will continue OFF metformin and will stop indomethacin. Discussed avoiding NSAIDS. Will repeat CMP and urine microalbumin today. Pt has evaluation with Dr. Holley Raring in nephrology scheduled for tomorrow. Will follow.

## 2013-01-05 NOTE — Assessment & Plan Note (Signed)
Will request notes on previous sleep study. Pt reports unable to tolerate CPAP mask. Continue supplemental oxygen at night.

## 2013-01-05 NOTE — Assessment & Plan Note (Signed)
Will check TSH with labs today. Continue Levothyroxine. 

## 2013-01-05 NOTE — Assessment & Plan Note (Signed)
Symptoms consistent with diabetic neuropathy. Will continue Lyrica.

## 2013-01-05 NOTE — Assessment & Plan Note (Signed)
Body mass index is 37.9 kg/(m^2).  Will check TSH with labs today. Encouraged healthy diet, low in processed carbohydrates.

## 2013-01-05 NOTE — Assessment & Plan Note (Signed)
Currently asymptomatic. Recommended stopping Indomethacin given kidney disease.

## 2013-01-11 ENCOUNTER — Encounter: Payer: Self-pay | Admitting: Internal Medicine

## 2013-01-11 ENCOUNTER — Ambulatory Visit (INDEPENDENT_AMBULATORY_CARE_PROVIDER_SITE_OTHER): Payer: Medicare Other | Admitting: Internal Medicine

## 2013-01-11 VITALS — BP 110/76 | HR 70 | Temp 98.2°F | Resp 14 | Ht 71.0 in | Wt 265.5 lb

## 2013-01-11 DIAGNOSIS — E1129 Type 2 diabetes mellitus with other diabetic kidney complication: Secondary | ICD-10-CM

## 2013-01-11 DIAGNOSIS — N189 Chronic kidney disease, unspecified: Secondary | ICD-10-CM

## 2013-01-11 DIAGNOSIS — E1165 Type 2 diabetes mellitus with hyperglycemia: Secondary | ICD-10-CM

## 2013-01-11 DIAGNOSIS — E1140 Type 2 diabetes mellitus with diabetic neuropathy, unspecified: Secondary | ICD-10-CM

## 2013-01-11 DIAGNOSIS — IMO0002 Reserved for concepts with insufficient information to code with codable children: Secondary | ICD-10-CM

## 2013-01-11 DIAGNOSIS — E1149 Type 2 diabetes mellitus with other diabetic neurological complication: Secondary | ICD-10-CM | POA: Diagnosis not present

## 2013-01-11 DIAGNOSIS — D229 Melanocytic nevi, unspecified: Secondary | ICD-10-CM

## 2013-01-11 DIAGNOSIS — D239 Other benign neoplasm of skin, unspecified: Secondary | ICD-10-CM

## 2013-01-11 DIAGNOSIS — N058 Unspecified nephritic syndrome with other morphologic changes: Secondary | ICD-10-CM

## 2013-01-11 DIAGNOSIS — E1142 Type 2 diabetes mellitus with diabetic polyneuropathy: Secondary | ICD-10-CM

## 2013-01-11 NOTE — Assessment & Plan Note (Signed)
Patient has not establish care with nephrology. Recent renal function showed improvement compared to renal function during his hospitalization. Will continue current medications. Encourage continued efforts at healthy diet, increase physical activity and weight loss.

## 2013-01-11 NOTE — Progress Notes (Signed)
Subjective:    Patient ID: Douglas [name], male    DOB: 11-18-1945, 67 y.o.   MRN: [id number]  HPI  67 year old male with history of diabetes, chronic kidney disease, hypothyroidism, diabetic neuropathy presents for followup. He reports that he is generally doing well. He has not been checking his blood sugars. Recent A1c showed improvement from previous at 7.7%. He is compliant with his medications.  He continues to have burning pain in his hands and feet. He has been taking Lyrica with some improvement. He continues to follow with pain management.  He was recently seen by a local nephrologist for chronic kidney disease. No changes were made in medications.  He is concerned about a on his right shoulder. He is unsure how long this has been present. He is unsure if it has changed. It is described as dark brown and irregular. His mother had a history of melanoma.  Outpatient Encounter Prescriptions as of 01/11/2013  Medication Sig Dispense Refill  . allopurinol (ZYLOPRIM) 300 MG tablet 300 mg daily.      Marland Kitchen ALPRAZolam (XANAX) 1 MG tablet Take 0.5 mg by mouth at bedtime as needed for sleep (1/2 tab at night).       Marland Kitchen aspirin 325 MG EC tablet Take 325 mg by mouth daily.      . colchicine 0.6 MG tablet Take 0.6 mg by mouth as needed. Take 2 tablets then an hour later take 2 tablets then next hour take 1 tablet as needed for gout flare.      . doxazosin (CARDURA) 8 MG tablet Take 8 mg by mouth at bedtime. Taking 1/2 tablet at night      . furosemide (LASIX) 40 MG tablet Take 40 mg by mouth daily.      Marland Kitchen glimepiride (AMARYL) 2 MG tablet Take 1 tablet (2 mg total) by mouth 2 (two) times daily.  180 tablet  4  . lansoprazole (PREVACID) 30 MG capsule Take 30 mg by mouth daily.      Marland Kitchen levothyroxine (SYNTHROID, LEVOTHROID) 100 MCG tablet Take 100 mcg by mouth daily before breakfast. Taking 100 mcg everyday except Monday and Wednesday take 200 mcg      . Magnesium 500 MG TABS Take by mouth.      .  nebivolol (BYSTOLIC) 5 MG tablet Take 5 mg by mouth daily.       . pregabalin (LYRICA) 100 MG capsule Take 1 capsule (100 mg total) by mouth 3 (three) times daily.  90 capsule  5  . simvastatin (ZOCOR) 40 MG tablet 20 mg daily.       . vitamin B-12 (CYANOCOBALAMIN) 1000 MCG tablet Take 1,000 mcg by mouth daily.      . Vitamins A & D (VITAMIN A & D) 8000-400 UNITS CAPS Take by mouth.      Marland Kitchen HYDROcodone-acetaminophen (NORCO/VICODIN) 5-325 MG per tablet Take 1 tablet by mouth every 6 (six) hours as needed for pain.       No facility-administered encounter medications on file as of 01/11/2013.   BP 110/76  Pulse 70  Temp(Src) 98.2 F (36.8 C) (Oral)  Resp 14  Ht 5\' 11"  (1.803 m)  Wt 265 lb 8 oz (120.43 kg)  BMI 37.05 kg/m2  SpO2 98%  Review of Systems  Constitutional: Negative for fever, chills, activity change, appetite change, fatigue and unexpected weight change.  Eyes: Negative for visual disturbance.  Respiratory: Negative for cough and shortness of breath.   Cardiovascular: Negative for chest pain,  palpitations and leg swelling.  Gastrointestinal: Negative for abdominal pain and abdominal distention.  Genitourinary: Negative for dysuria, urgency and difficulty urinating.  Musculoskeletal: Positive for myalgias, back pain and arthralgias. Negative for gait problem.  Skin: Negative for color change and rash.  Hematological: Negative for adenopathy.  Psychiatric/Behavioral: Negative for sleep disturbance and dysphoric mood. The patient is not nervous/anxious.       Objective:   Physical Exam  Constitutional: He is oriented to person, place, and time. He appears well-developed and well-nourished. No distress.  HENT:  Head: Normocephalic and atraumatic.  Right Ear: External ear normal.  Left Ear: External ear normal.  Nose: Nose normal.  Mouth/Throat: Oropharynx is clear and moist. No oropharyngeal exudate.  Eyes: Conjunctivae and EOM are normal. Pupils are equal, round, and  reactive to light. Right eye exhibits no discharge. Left eye exhibits no discharge. No scleral icterus.  Neck: Normal range of motion. Neck supple. No tracheal deviation present. No thyromegaly present.  Cardiovascular: Normal rate, regular rhythm and normal heart sounds.  Exam reveals no gallop and no friction rub.   No murmur heard. Pulmonary/Chest: Effort normal and breath sounds normal. No respiratory distress. He has no wheezes. He has no rales. He exhibits no tenderness.  Musculoskeletal: Normal range of motion. He exhibits no edema.  Lymphadenopathy:    He has no cervical adenopathy.  Neurological: He is alert and oriented to person, place, and time. No cranial nerve deficit. Coordination normal.  Skin: Skin is warm and dry. Lesion noted. No rash noted. He is not diaphoretic. No erythema. No pallor.     Psychiatric: He has a normal mood and affect. His behavior is normal. Judgment and thought content normal.          Assessment & Plan:

## 2013-01-11 NOTE — Assessment & Plan Note (Signed)
Atypical nevus right shoulder concerning for dysplasia or melanoma. Will set up dermatology evaluation for biopsy.

## 2013-01-11 NOTE — Assessment & Plan Note (Signed)
Persistent symptoms of neuropathy with burning in hands and feet. We discussed potentially increasing dose of Lyrica. He would prefer to hold off for now given the cost of this medication. He will followup with pain management in December or sooner as needed.

## 2013-01-11 NOTE — Assessment & Plan Note (Signed)
Recent A1c 7.7% showed improvement compared to previous A1c which was 10%. We'll continue glimepiride. Encouraged patient to monitor blood sugars a couple of times per week. Encouraged continued efforts at healthy diet. Followup in 3 months or sooner as needed.

## 2013-01-17 ENCOUNTER — Encounter: Payer: Self-pay | Admitting: Internal Medicine

## 2013-01-18 ENCOUNTER — Encounter: Payer: Self-pay | Admitting: Emergency Medicine

## 2013-01-18 DIAGNOSIS — N529 Male erectile dysfunction, unspecified: Secondary | ICD-10-CM | POA: Diagnosis not present

## 2013-01-18 DIAGNOSIS — E291 Testicular hypofunction: Secondary | ICD-10-CM | POA: Diagnosis not present

## 2013-01-19 ENCOUNTER — Encounter: Payer: Self-pay | Admitting: Internal Medicine

## 2013-02-01 DIAGNOSIS — I10 Essential (primary) hypertension: Secondary | ICD-10-CM | POA: Diagnosis not present

## 2013-02-01 DIAGNOSIS — D631 Anemia in chronic kidney disease: Secondary | ICD-10-CM | POA: Diagnosis not present

## 2013-02-01 DIAGNOSIS — N2581 Secondary hyperparathyroidism of renal origin: Secondary | ICD-10-CM | POA: Diagnosis not present

## 2013-02-01 DIAGNOSIS — N183 Chronic kidney disease, stage 3 unspecified: Secondary | ICD-10-CM | POA: Diagnosis not present

## 2013-02-11 ENCOUNTER — Telehealth: Payer: Self-pay | Admitting: Internal Medicine

## 2013-02-11 NOTE — Telephone Encounter (Signed)
doxazosin (CARDURA) 8 MG tablet

## 2013-02-14 ENCOUNTER — Ambulatory Visit: Payer: Medicare Other | Admitting: Internal Medicine

## 2013-02-14 MED ORDER — DOXAZOSIN MESYLATE 8 MG PO TABS: 8.0000 mg | ORAL_TABLET | Freq: Every day | ORAL | Status: DC

## 2013-02-14 NOTE — Telephone Encounter (Signed)
Prescription sent to pharmacy on file. 

## 2013-02-22 DIAGNOSIS — L821 Other seborrheic keratosis: Secondary | ICD-10-CM | POA: Diagnosis not present

## 2013-02-22 DIAGNOSIS — D485 Neoplasm of uncertain behavior of skin: Secondary | ICD-10-CM | POA: Diagnosis not present

## 2013-02-22 DIAGNOSIS — L820 Inflamed seborrheic keratosis: Secondary | ICD-10-CM | POA: Diagnosis not present

## 2013-02-22 DIAGNOSIS — D239 Other benign neoplasm of skin, unspecified: Secondary | ICD-10-CM | POA: Diagnosis not present

## 2013-02-22 DIAGNOSIS — L538 Other specified erythematous conditions: Secondary | ICD-10-CM | POA: Diagnosis not present

## 2013-02-22 DIAGNOSIS — L259 Unspecified contact dermatitis, unspecified cause: Secondary | ICD-10-CM | POA: Diagnosis not present

## 2013-02-22 DIAGNOSIS — L578 Other skin changes due to chronic exposure to nonionizing radiation: Secondary | ICD-10-CM | POA: Diagnosis not present

## 2013-03-17 ENCOUNTER — Ambulatory Visit (INDEPENDENT_AMBULATORY_CARE_PROVIDER_SITE_OTHER): Payer: Medicare Other | Admitting: Nurse Practitioner

## 2013-03-17 ENCOUNTER — Encounter: Payer: Self-pay | Admitting: Nurse Practitioner

## 2013-03-17 ENCOUNTER — Encounter (INDEPENDENT_AMBULATORY_CARE_PROVIDER_SITE_OTHER): Payer: Self-pay

## 2013-03-17 VITALS — BP 114/74 | HR 84 | Ht 73.0 in | Wt 263.0 lb

## 2013-03-17 DIAGNOSIS — E1149 Type 2 diabetes mellitus with other diabetic neurological complication: Secondary | ICD-10-CM

## 2013-03-17 DIAGNOSIS — E1142 Type 2 diabetes mellitus with diabetic polyneuropathy: Secondary | ICD-10-CM | POA: Diagnosis not present

## 2013-03-17 DIAGNOSIS — E1140 Type 2 diabetes mellitus with diabetic neuropathy, unspecified: Secondary | ICD-10-CM

## 2013-03-17 DIAGNOSIS — G609 Hereditary and idiopathic neuropathy, unspecified: Secondary | ICD-10-CM | POA: Insufficient documentation

## 2013-03-17 MED ORDER — PREGABALIN 100 MG PO CAPS: 100.0000 mg | ORAL_CAPSULE | Freq: Three times a day (TID) | ORAL | Status: DC

## 2013-03-17 NOTE — Patient Instructions (Addendum)
Continue Lyrica 100mg  3 times daily F/U in 6 months Try B complex vitamin OTC if not helpful may try Metanx at next visit

## 2013-03-17 NOTE — Progress Notes (Signed)
[facility]  PATIENT: Douglas [name] DOB: 1945/12/16   REASON FOR VISIT: Followup for neuropathy    HISTORY OF PRESENT ILLNESS: Mr. Huizar 67 year old male returns for followup. He has a history of bilateral feet and hand paresthesias. He also has a history of diabetes but states it is in good control. Apparently he has tried to eat healthy over the last several months but is frustrated that he has not lost weight. He was unable to afford his Lyrica for a couple of weeks and his pain got much worse however he is back on the drug at present. He was made aware that we have samples of the drug if that ever happens again because I do not want him to be off of the medication. EMG nerve conduction in the past with evidence of length dependent mild axonal peripheral neuropathy. Patient had leg swelling on gabapentin. He did not find the compound cream to be beneficial. He does not exercise.    HISTORY: bilateral feet and hand paresthesias. Also past medical history of gout, obesity, obstructive sleep apnea but could not tolerate CPAP and diabetes for 3-1/2-4 years. He has no distal leg weakness, history of low back pain and occasional right lower extremity shooting pain. He was placed on Lyrica and currently taking 75 mg 3 times daily. His pain is not completely controlled but much better than when last seen. EMG nerve conduction with evidence of length dependent mild axonal peripheral neuropathy. There is no evidence of right lumbosacral radiculopathy. Patient has had side effects to gabapentin in the past which cause swelling and a rash. He did not find the cream to be beneficial.    REVIEW OF SYSTEMS: Full 14 system review of systems performed and notable only for: All negative except where  indicated  Constitutional: N/A  Cardiovascular: N/A  Ear/Nose/Throat: N/A  Skin: N/A  Eyes: N/A  Respiratory: N/A  Gastroitestinal: N/A  Hematology/Lymphatic: N/A  Endocrine:  N/A Musculoskeletal: Joint pain, aching muscles  Allergy/Immunology: N/A  Neurological: N/A Psychiatric: Anxiety, decreased energy, change in appetite  ALLERGIES: Allergies  Allergen Reactions  . Atenolol   . Hctz [Hydrochlorothiazide]   . Bee Venom Swelling    HOME MEDICATIONS: Outpatient Prescriptions Prior to Visit  Medication Sig Dispense Refill  . allopurinol (ZYLOPRIM) 300 MG tablet 300 mg daily.      Marland Kitchen ALPRAZolam (XANAX) 1 MG tablet Take 0.5 mg by mouth at bedtime as needed for sleep (1/2 tab at night).       Marland Kitchen aspirin 325 MG EC tablet Take 325 mg by mouth daily.      . furosemide (LASIX) 40 MG tablet Take 40 mg by mouth daily.      Marland Kitchen glimepiride (AMARYL) 2 MG tablet Take 1 tablet (2 mg total) by mouth 2 (two) times daily.  180 tablet  4  . HYDROcodone-acetaminophen (NORCO/VICODIN) 5-325 MG per tablet Take 1 tablet by mouth every 6 (six) hours as needed for pain.      Marland Kitchen lansoprazole (PREVACID) 30 MG capsule Take 30 mg by mouth daily.      Marland Kitchen levothyroxine (SYNTHROID, LEVOTHROID) 100 MCG tablet Take 100 mcg by mouth daily before breakfast. Taking 100 mcg everyday except Monday and Wednesday take 200 mcg      . Magnesium 500 MG TABS Take by mouth.      . pregabalin (LYRICA) 100 MG capsule Take 1 capsule (100 mg total) by mouth 3 (three) times daily.  90 capsule  5  .  simvastatin (ZOCOR) 40 MG tablet 20 mg daily.       . vitamin B-12 (CYANOCOBALAMIN) 1000 MCG tablet Take 1,000 mcg by mouth daily.      . Vitamins A & D (VITAMIN A & D) 8000-400 UNITS CAPS Take by mouth.      . doxazosin (CARDURA) 8 MG tablet Take 1 tablet (8 mg total) by mouth at bedtime.  30 tablet  5  . colchicine 0.6 MG tablet Take 0.6 mg by mouth as needed. Take 2 tablets then an hour later take 2 tablets then next hour take 1 tablet as needed for gout flare.      . nebivolol (BYSTOLIC) 5 MG tablet Take 5 mg by mouth daily.        No facility-administered medications prior to visit.    PAST MEDICAL  HISTORY: Past Medical History  Diagnosis Date  . Neuropathy     diabetes  . Hypertension   . Diabetes mellitus without complication   . Gout   . Depression   . OSA (obstructive sleep apnea)     supplemental oxygen at night  . Syncope and collapse   . Kidney stones     Dr. Rogers Blocker    PAST SURGICAL HISTORY: Past Surgical History  Procedure Laterality Date  . Knee surgery      arthroscopy  . Back surgery      rupture disc lumbar spine  . Shoulder surgery Bilateral     arthroscopic right, rotator cuff repair left    FAMILY HISTORY: Family History  Problem Relation Age of Onset  . Breast cancer Mother   . Lung cancer Mother   . Bone cancer Mother   . Heart Problems Mother   . Cancer Mother     breast, lung and rib  . AAA (abdominal aortic aneurysm) Mother   . Heart disease Mother   . Heart attack Father   . Heart disease Father   . Heart attack Brother   . Cancer Brother     esophageal    SOCIAL HISTORY: History   Social History  . Marital Status: Married    Spouse Name: [name]    Number of Children: 3  . Years of Education: 13   Occupational History  .      retired   Social History Main Topics  . Smoking status: Never Smoker   . Smokeless tobacco: Never Used  . Alcohol Use: No  . Drug Use: No  . Sexual Activity: Not on file   Other Topics Concern  . Not on file   Social History Narrative   Lives in [place] with his wife ([name]). No children. Three step children. No pets.      Work - Patient is retired. Maintenance supervisor.      School - One year college education.      Right handed.      [place], served in [place], no combat                 PHYSICAL EXAM  Filed Vitals:   03/17/13 1005  BP: 114/74  Pulse: 84  Height: 6\' 1"  (1.854 m)  Weight: 263 lb (119.296 kg)   Body mass index is 34.71 kg/(m^2).  Generalized: Well developed, obese male in no acute distress  Head: normocephalic and atraumatic,.  Oropharynx benign  Neck: Supple, no carotid bruits  Cardiac: Regular rate rhythm, no murmur    Neurological examination   Mentation: Alert oriented to time, place,  history taking. Follows all commands speech and language fluent  Cranial nerve II-XII: Pupils were equal round reactive to light extraocular movements were full, visual field were full on confrontational test. Facial sensation and strength were normal. hearing was intact to finger rubbing bilaterally. Uvula tongue midline. head turning and shoulder shrug and were normal and symmetric.Tongue protrusion into cheek strength was normal. Motor: normal bulk and tone, full strength in the BUE, BLE, fine finger movements normal, no pronator drift. No focal weakness Sensory: Length dependent decreased light touch decreased pinprick to the knees bilaterally decreased vibratory to ankles proprioception normal   Coordination: finger-nose-finger, heel-to-shin bilaterally, no dysmetria Reflexes: Brachioradialis 2/2, biceps 2/2, triceps 2/2, patellar 2/2, Achilles absent, plantar responses were flexor bilaterally. Gait and Station: Rising up from seated position without assistance, normal stance,  moderate stride, good arm swing, smooth turning, moderate difficulty  to perform tiptoe, and heel walking. Unsteady with tandem walk   DIAGNOSTIC DATA (LABS, IMAGING, TESTING) - I reviewed patient records, labs, notes, testing and imaging myself where available.      Component Value Date/Time   NA 140 01/04/2013 1554   K 4.3 01/04/2013 1554   CL 105 01/04/2013 1554   CO2 27 01/04/2013 1554   GLUCOSE 162* 01/04/2013 1554   BUN 26* 01/04/2013 1554   CREATININE 2.0* 01/04/2013 1554   CALCIUM 9.6 01/04/2013 1554   PROT 6.7 01/04/2013 1554   ALBUMIN 4.0 01/04/2013 1554   AST 44* 01/04/2013 1554   ALT 42 01/04/2013 1554   ALKPHOS 46 01/04/2013 1554   BILITOT 0.8 01/04/2013 1554   Lab Results  Component Value Date   CHOL 106 01/04/2013   HDL 34.10* 01/04/2013    LDLCALC 39 01/04/2013   TRIG 164.0* 01/04/2013   CHOLHDL 3 01/04/2013   Lab Results  Component Value Date   HGBA1C 7.7* 01/04/2013    Lab Results  Component Value Date   TSH 0.73 01/04/2013      ASSESSMENT AND PLAN  67 y.o. year old male  has a past medical history of Neuropathy; Hypertension; Diabetes mellitus without complication; Gout; Depression; OSA (obstructive sleep apnea); here for follow up. EMG nerve conduction with length dependent mild axonal peripheral neuropathy. Currently on Lyrica 100 mg 3 times daily  Continue Lyrica 100mg  3 times daily F/U in 6 months Try B complex vitamin OTC if not helpful may try Metanx at next visit he was given written instructions on how Metanx works on the nerves Dennie Bible, [facility], [facility], [facility] [address], [place], [place] [address] [phone]

## 2013-03-18 ENCOUNTER — Other Ambulatory Visit: Payer: Self-pay | Admitting: Nurse Practitioner

## 2013-03-18 ENCOUNTER — Telehealth: Payer: Self-pay | Admitting: *Deleted

## 2013-03-18 MED ORDER — ALPRAZOLAM 1 MG PO TABS: 1.0000 mg | ORAL_TABLET | Freq: Three times a day (TID) | ORAL | Status: DC | PRN

## 2013-03-18 NOTE — Telephone Encounter (Signed)
Rx printed to be signed and faxed.  

## 2013-03-18 NOTE — Telephone Encounter (Signed)
Douglas [name] from [facility] left a message stating patient would like a refill on his Alprazolam. They were supposed to have sent it on Monday but they sent it to the wrong office.   Alprazolam 1 mg  1/2-1 tablet TID PRN #[id number]

## 2013-03-18 NOTE — Telephone Encounter (Signed)
Patient's prescription was faxed over [facility] at [phone].

## 2013-03-18 NOTE — Telephone Encounter (Signed)
Fine to refill 

## 2013-03-25 ENCOUNTER — Other Ambulatory Visit: Payer: Self-pay | Admitting: *Deleted

## 2013-03-25 MED ORDER — LEVOTHYROXINE SODIUM 100 MCG PO TABS: 100.0000 ug | ORAL_TABLET | Freq: Every day | ORAL | Status: DC

## 2013-03-29 DIAGNOSIS — L821 Other seborrheic keratosis: Secondary | ICD-10-CM | POA: Diagnosis not present

## 2013-03-29 DIAGNOSIS — D239 Other benign neoplasm of skin, unspecified: Secondary | ICD-10-CM | POA: Diagnosis not present

## 2013-03-29 DIAGNOSIS — L538 Other specified erythematous conditions: Secondary | ICD-10-CM | POA: Diagnosis not present

## 2013-03-29 DIAGNOSIS — L259 Unspecified contact dermatitis, unspecified cause: Secondary | ICD-10-CM | POA: Diagnosis not present

## 2013-03-29 DIAGNOSIS — L578 Other skin changes due to chronic exposure to nonionizing radiation: Secondary | ICD-10-CM | POA: Diagnosis not present

## 2013-04-12 ENCOUNTER — Ambulatory Visit: Payer: Medicare Other | Admitting: Internal Medicine

## 2013-04-18 ENCOUNTER — Encounter: Payer: Self-pay | Admitting: *Deleted

## 2013-04-19 ENCOUNTER — Ambulatory Visit: Payer: Medicare Other | Admitting: Internal Medicine

## 2013-04-29 ENCOUNTER — Ambulatory Visit: Payer: Medicare Other | Admitting: Internal Medicine

## 2013-05-02 ENCOUNTER — Encounter: Payer: Self-pay | Admitting: Internal Medicine

## 2013-05-02 ENCOUNTER — Encounter (INDEPENDENT_AMBULATORY_CARE_PROVIDER_SITE_OTHER): Payer: Self-pay

## 2013-05-02 ENCOUNTER — Ambulatory Visit (INDEPENDENT_AMBULATORY_CARE_PROVIDER_SITE_OTHER): Payer: Medicare Other | Admitting: Internal Medicine

## 2013-05-02 VITALS — BP 114/78 | HR 83 | Temp 98.0°F | Wt 261.0 lb

## 2013-05-02 DIAGNOSIS — E669 Obesity, unspecified: Secondary | ICD-10-CM

## 2013-05-02 DIAGNOSIS — E1129 Type 2 diabetes mellitus with other diabetic kidney complication: Secondary | ICD-10-CM | POA: Diagnosis not present

## 2013-05-02 DIAGNOSIS — G8929 Other chronic pain: Secondary | ICD-10-CM

## 2013-05-02 DIAGNOSIS — IMO0002 Reserved for concepts with insufficient information to code with codable children: Secondary | ICD-10-CM

## 2013-05-02 DIAGNOSIS — E1165 Type 2 diabetes mellitus with hyperglycemia: Secondary | ICD-10-CM | POA: Diagnosis not present

## 2013-05-02 DIAGNOSIS — E039 Hypothyroidism, unspecified: Secondary | ICD-10-CM | POA: Diagnosis not present

## 2013-05-02 DIAGNOSIS — N189 Chronic kidney disease, unspecified: Secondary | ICD-10-CM | POA: Diagnosis not present

## 2013-05-02 DIAGNOSIS — Z23 Encounter for immunization: Secondary | ICD-10-CM

## 2013-05-02 DIAGNOSIS — M549 Dorsalgia, unspecified: Secondary | ICD-10-CM

## 2013-05-02 DIAGNOSIS — G609 Hereditary and idiopathic neuropathy, unspecified: Secondary | ICD-10-CM

## 2013-05-02 LAB — TSH: TSH: 3.34 u[IU]/mL (ref 0.35–5.50)

## 2013-05-02 LAB — CBC WITH DIFFERENTIAL/PLATELET
Basophils Absolute: 0 10*3/uL (ref 0.0–0.1)
Basophils Relative: 0.4 % (ref 0.0–3.0)
Eosinophils Absolute: 0.2 10*3/uL (ref 0.0–0.7)
Eosinophils Relative: 3.1 % (ref 0.0–5.0)
HCT: 44.2 % (ref 39.0–52.0)
Hemoglobin: 14.3 g/dL (ref 13.0–17.0)
Lymphocytes Relative: 32.6 % (ref 12.0–46.0)
Lymphs Abs: 1.8 10*3/uL (ref 0.7–4.0)
MCHC: 32.4 g/dL (ref 30.0–36.0)
MCV: 89.6 fl (ref 78.0–100.0)
Monocytes Absolute: 0.4 10*3/uL (ref 0.1–1.0)
Monocytes Relative: 7 % (ref 3.0–12.0)
Neutro Abs: 3.2 10*3/uL (ref 1.4–7.7)
Neutrophils Relative %: 56.9 % (ref 43.0–77.0)
Platelets: 152 10*3/uL (ref 150.0–400.0)
RBC: 4.93 Mil/uL (ref 4.22–5.81)
RDW: 15.6 % — ABNORMAL HIGH (ref 11.5–14.6)
WBC: 5.6 10*3/uL (ref 4.5–10.5)

## 2013-05-02 LAB — VITAMIN B12: Vitamin B-12: 594 pg/mL (ref 211–911)

## 2013-05-02 LAB — COMPREHENSIVE METABOLIC PANEL
ALT: 23 U/L (ref 0–53)
AST: 28 U/L (ref 0–37)
Albumin: 4 g/dL (ref 3.5–5.2)
Alkaline Phosphatase: 65 U/L (ref 39–117)
BUN: 17 mg/dL (ref 6–23)
CO2: 29 mEq/L (ref 19–32)
Calcium: 9.3 mg/dL (ref 8.4–10.5)
Chloride: 104 mEq/L (ref 96–112)
Creatinine, Ser: 1.7 mg/dL — ABNORMAL HIGH (ref 0.4–1.5)
GFR: 42.82 mL/min — ABNORMAL LOW (ref >60.00)
Glucose, Bld: 82 mg/dL (ref 70–99)
Potassium: 4 mEq/L (ref 3.5–5.1)
Sodium: 140 mEq/L (ref 135–145)
Total Bilirubin: 1 mg/dL (ref 0.3–1.2)
Total Protein: 7.1 g/dL (ref 6.0–8.3)

## 2013-05-02 LAB — HEMOGLOBIN A1C: Hgb A1c MFr Bld: 5.7 % (ref 4.6–6.5)

## 2013-05-02 LAB — LIPID PANEL
Cholesterol: 108 mg/dL (ref 0–200)
HDL: 37.6 mg/dL — ABNORMAL LOW (ref >39.00)
LDL Cholesterol: 50 mg/dL (ref 0–99)
Total CHOL/HDL Ratio: 3
Triglycerides: 102 mg/dL (ref 0.0–149.0)
VLDL: 20.4 mg/dL (ref 0.0–40.0)

## 2013-05-02 MED ORDER — LANSOPRAZOLE 30 MG PO CPDR: 30.0000 mg | DELAYED_RELEASE_CAPSULE | Freq: Every day | ORAL | Status: DC

## 2013-05-02 MED ORDER — LEVOTHYROXINE SODIUM 100 MCG PO TABS
ORAL_TABLET | ORAL | Status: DC
Start: 2013-05-02 — End: 2014-03-29

## 2013-05-02 MED ORDER — ALLOPURINOL 300 MG PO TABS: 300.0000 mg | ORAL_TABLET | Freq: Every day | ORAL | Status: DC

## 2013-05-02 MED ORDER — FUROSEMIDE 40 MG PO TABS: 40.0000 mg | ORAL_TABLET | Freq: Every day | ORAL | Status: DC

## 2013-05-02 NOTE — Assessment & Plan Note (Signed)
Wt Readings from Last 3 Encounters:  05/02/13 261 lb (118.389 kg)  03/17/13 263 lb (119.296 kg)  01/11/13 265 lb 8 oz (120.43 kg)   Congratulated pt on weight loss. Encouraged continued healthy diet and regular exercise as tolerated.

## 2013-05-02 NOTE — Assessment & Plan Note (Signed)
Renal function improved on labs today. Plan repeat renal function in 3 months.

## 2013-05-02 NOTE — Progress Notes (Signed)
Subjective:    Patient ID: Douglas [name], male    DOB: 21-Nov-1945, 68 y.o.   MRN: [id number]  HPI 68 year old male with history of diabetes, hypertension, hyperlipidemia, chronic back pain presents for followup. He reports that he has been feeling well. He does not check his blood sugars. He does report compliance with medications. He notes over last few weeks he has had some congestion and clear nasal drainage. He denies any sinus pressure or pain. He denies any fever or chills. He denies cough or shortness of breath. He is taking some over-the-counter Mucinex with improvement in his symptoms.  Outpatient Encounter Prescriptions as of 05/02/2013  Medication Sig  . allopurinol (ZYLOPRIM) 300 MG tablet Take 1 tablet (300 mg total) by mouth daily.  Marland Kitchen ALPRAZolam (XANAX) 1 MG tablet Take 1 tablet (1 mg total) by mouth 3 (three) times daily as needed for sleep (Take 1/2 tablet - 1 tablet three times a day as needed).  Marland Kitchen aspirin 325 MG EC tablet Take 325 mg by mouth daily.  . colchicine 0.6 MG tablet Take 0.6 mg by mouth as needed. Take 2 tablets then an hour later take 2 tablets then next hour take 1 tablet as needed for gout flare.  . doxazosin (CARDURA) 8 MG tablet Take 4 mg by mouth at bedtime.  . furosemide (LASIX) 40 MG tablet Take 1 tablet (40 mg total) by mouth daily.  Marland Kitchen glimepiride (AMARYL) 2 MG tablet Take 1 tablet (2 mg total) by mouth 2 (two) times daily.  Marland Kitchen HYDROcodone-acetaminophen (NORCO/VICODIN) 5-325 MG per tablet Take 1 tablet by mouth every 6 (six) hours as needed for pain.  Marland Kitchen lansoprazole (PREVACID) 30 MG capsule Take 1 capsule (30 mg total) by mouth daily.  Marland Kitchen levothyroxine (SYNTHROID, LEVOTHROID) 100 MCG tablet Take 100 mcg everyday except Monday and Wednesday take 200 mcg  . Magnesium 500 MG TABS Take by mouth.  . pregabalin (LYRICA) 100 MG capsule Take 1 capsule (100 mg total) by mouth 3 (three) times daily.  . simvastatin (ZOCOR) 40 MG tablet 20 mg daily.   . tamoxifen  (NOLVADEX) 20 MG tablet   . Testosterone Propionate 2 % CREA Place 1 mL onto the skin daily.  Marland Kitchen triamcinolone cream (KENALOG) 0.1 %   . vitamin B-12 (CYANOCOBALAMIN) 1000 MCG tablet Take 1,000 mcg by mouth daily.  . Vitamins A & D (VITAMIN A & D) 8000-400 UNITS CAPS Take by mouth.   BP 114/78  Pulse 83  Temp(Src) 98 F (36.7 C) (Oral)  Wt 261 lb (118.389 kg)  SpO2 95%  Review of Systems  Constitutional: Negative for fever, chills, activity change, appetite change, fatigue and unexpected weight change.  HENT: Positive for congestion, postnasal drip and rhinorrhea. Negative for sinus pressure.   Eyes: Negative for visual disturbance.  Respiratory: Negative for cough and shortness of breath.   Cardiovascular: Negative for chest pain, palpitations and leg swelling.  Gastrointestinal: Negative for abdominal pain and abdominal distention.  Genitourinary: Negative for dysuria, urgency and difficulty urinating.  Musculoskeletal: Negative for arthralgias and gait problem.  Skin: Negative for color change and rash.  Hematological: Negative for adenopathy.  Psychiatric/Behavioral: Negative for sleep disturbance and dysphoric mood. The patient is not nervous/anxious.        Objective:   Physical Exam  Constitutional: He is oriented to person, place, and time. He appears well-developed and well-nourished. No distress.  HENT:  Head: Normocephalic and atraumatic.  Right Ear: External ear normal.  Left Ear: External ear  normal.  Nose: Nose normal.  Mouth/Throat: Oropharynx is clear and moist. No oropharyngeal exudate.  Eyes: Conjunctivae and EOM are normal. Pupils are equal, round, and reactive to light. Right eye exhibits no discharge. Left eye exhibits no discharge. No scleral icterus.  Neck: Normal range of motion. Neck supple. No tracheal deviation present. No thyromegaly present.  Cardiovascular: Normal rate, regular rhythm and normal heart sounds.  Exam reveals no gallop and no friction  rub.   No murmur heard. Pulmonary/Chest: Effort normal and breath sounds normal. No respiratory distress. He has no wheezes. He has no rales. He exhibits no tenderness.  Musculoskeletal: Normal range of motion. He exhibits no edema.  Lymphadenopathy:    He has no cervical adenopathy.  Neurological: He is alert and oriented to person, place, and time. No cranial nerve deficit. Coordination normal.  Skin: Skin is warm and dry. No rash noted. He is not diaphoretic. No erythema. No pallor.  Psychiatric: He has a normal mood and affect. His behavior is normal. Judgment and thought content normal.          Assessment & Plan:

## 2013-05-02 NOTE — Assessment & Plan Note (Addendum)
Lab Results  Component Value Date   HGBA1C 5.7 05/02/2013   Encouraged him to check his BG given that A1c is markedly reduced compared to previous. Question if he may have some low BG<70. If BG<70, then consider stopping Amaryl.

## 2013-05-02 NOTE — Progress Notes (Signed)
Pre-visit discussion using our clinic review tool. No additional management support is needed unless otherwise documented below in the visit note.  

## 2013-05-02 NOTE — Assessment & Plan Note (Signed)
Symptomatically, doing well. TSH normal. Continue levothyroxine.

## 2013-05-02 NOTE — Assessment & Plan Note (Signed)
Followed by pain management. Symptoms well controlled on current medication.

## 2013-05-02 NOTE — Assessment & Plan Note (Signed)
Symptoms well controlled with Lyrica. Will continue.

## 2013-05-03 LAB — VITAMIN D 25 HYDROXY (VIT D DEFICIENCY, FRACTURES): Vit D, 25-Hydroxy: 83 ng/mL (ref 30–89)

## 2013-05-04 ENCOUNTER — Telehealth: Payer: Self-pay

## 2013-05-04 ENCOUNTER — Encounter: Payer: Self-pay | Admitting: *Deleted

## 2013-05-04 NOTE — Telephone Encounter (Signed)
Relevant patient education mailed to patient.  

## 2013-05-23 DIAGNOSIS — I10 Essential (primary) hypertension: Secondary | ICD-10-CM | POA: Diagnosis not present

## 2013-05-23 DIAGNOSIS — D631 Anemia in chronic kidney disease: Secondary | ICD-10-CM | POA: Diagnosis not present

## 2013-05-23 DIAGNOSIS — N183 Chronic kidney disease, stage 3 unspecified: Secondary | ICD-10-CM | POA: Diagnosis not present

## 2013-05-23 DIAGNOSIS — N2581 Secondary hyperparathyroidism of renal origin: Secondary | ICD-10-CM | POA: Diagnosis not present

## 2013-06-02 DIAGNOSIS — N179 Acute kidney failure, unspecified: Secondary | ICD-10-CM | POA: Diagnosis not present

## 2013-06-02 DIAGNOSIS — N183 Chronic kidney disease, stage 3 unspecified: Secondary | ICD-10-CM | POA: Diagnosis not present

## 2013-07-21 DIAGNOSIS — Z79899 Other long term (current) drug therapy: Secondary | ICD-10-CM | POA: Diagnosis not present

## 2013-07-21 DIAGNOSIS — N138 Other obstructive and reflux uropathy: Secondary | ICD-10-CM | POA: Diagnosis not present

## 2013-07-21 DIAGNOSIS — N476 Balanoposthitis: Secondary | ICD-10-CM | POA: Diagnosis not present

## 2013-07-21 DIAGNOSIS — N401 Enlarged prostate with lower urinary tract symptoms: Secondary | ICD-10-CM | POA: Diagnosis not present

## 2013-07-21 DIAGNOSIS — E291 Testicular hypofunction: Secondary | ICD-10-CM | POA: Diagnosis not present

## 2013-07-21 DIAGNOSIS — N529 Male erectile dysfunction, unspecified: Secondary | ICD-10-CM | POA: Diagnosis not present

## 2013-08-01 DIAGNOSIS — E291 Testicular hypofunction: Secondary | ICD-10-CM | POA: Diagnosis not present

## 2013-08-03 ENCOUNTER — Telehealth: Payer: Self-pay | Admitting: *Deleted

## 2013-08-03 NOTE — Telephone Encounter (Signed)
Remo Lipps left voicemail stating patient informed him his insurance will no longer pay for Prevacid. He has been buying it OTC out of pocket, asked him what alternatives he could suggest. Pharmacist Remo Lipps suggest prescribing patient Nexium 40 mg once a day. He has an appointment tomorrow with you, he suggest touching bases with patient to see if this is what he wants to do.

## 2013-08-04 ENCOUNTER — Ambulatory Visit (INDEPENDENT_AMBULATORY_CARE_PROVIDER_SITE_OTHER): Payer: Medicare Other | Admitting: Internal Medicine

## 2013-08-04 ENCOUNTER — Encounter: Payer: Self-pay | Admitting: Internal Medicine

## 2013-08-04 VITALS — BP 124/72 | HR 84 | Resp 16 | Wt 257.5 lb

## 2013-08-04 DIAGNOSIS — E1129 Type 2 diabetes mellitus with other diabetic kidney complication: Secondary | ICD-10-CM

## 2013-08-04 DIAGNOSIS — N400 Enlarged prostate without lower urinary tract symptoms: Secondary | ICD-10-CM | POA: Diagnosis not present

## 2013-08-04 DIAGNOSIS — N476 Balanoposthitis: Secondary | ICD-10-CM | POA: Diagnosis not present

## 2013-08-04 DIAGNOSIS — N529 Male erectile dysfunction, unspecified: Secondary | ICD-10-CM | POA: Diagnosis not present

## 2013-08-04 DIAGNOSIS — E1140 Type 2 diabetes mellitus with diabetic neuropathy, unspecified: Secondary | ICD-10-CM

## 2013-08-04 DIAGNOSIS — N189 Chronic kidney disease, unspecified: Secondary | ICD-10-CM | POA: Diagnosis not present

## 2013-08-04 DIAGNOSIS — E1165 Type 2 diabetes mellitus with hyperglycemia: Secondary | ICD-10-CM

## 2013-08-04 DIAGNOSIS — G4733 Obstructive sleep apnea (adult) (pediatric): Secondary | ICD-10-CM | POA: Diagnosis not present

## 2013-08-04 DIAGNOSIS — IMO0002 Reserved for concepts with insufficient information to code with codable children: Secondary | ICD-10-CM

## 2013-08-04 DIAGNOSIS — E291 Testicular hypofunction: Secondary | ICD-10-CM | POA: Diagnosis not present

## 2013-08-04 DIAGNOSIS — E1149 Type 2 diabetes mellitus with other diabetic neurological complication: Secondary | ICD-10-CM

## 2013-08-04 DIAGNOSIS — E669 Obesity, unspecified: Secondary | ICD-10-CM

## 2013-08-04 DIAGNOSIS — M109 Gout, unspecified: Secondary | ICD-10-CM

## 2013-08-04 LAB — COMPREHENSIVE METABOLIC PANEL
ALT: 25 U/L (ref 0–53)
AST: 29 U/L (ref 0–37)
Albumin: 4.1 g/dL (ref 3.5–5.2)
Alkaline Phosphatase: 68 U/L (ref 39–117)
BUN: 24 mg/dL — ABNORMAL HIGH (ref 6–23)
CO2: 32 mEq/L (ref 19–32)
Calcium: 9.9 mg/dL (ref 8.4–10.5)
Chloride: 100 mEq/L (ref 96–112)
Creatinine, Ser: 1.7 mg/dL — ABNORMAL HIGH (ref 0.4–1.5)
GFR: 43.38 mL/min — ABNORMAL LOW (ref >60.00)
Glucose, Bld: 142 mg/dL — ABNORMAL HIGH (ref 70–99)
Potassium: 4.1 mEq/L (ref 3.5–5.1)
Sodium: 139 mEq/L (ref 135–145)
Total Bilirubin: 1.2 mg/dL (ref 0.3–1.2)
Total Protein: 7 g/dL (ref 6.0–8.3)

## 2013-08-04 LAB — HEMOGLOBIN A1C: Hgb A1c MFr Bld: 5.4 % (ref 4.6–6.5)

## 2013-08-04 LAB — URIC ACID: Uric Acid, Serum: 4.9 mg/dL (ref 4.0–7.8)

## 2013-08-04 MED ORDER — ESOMEPRAZOLE MAGNESIUM 40 MG PO CPDR: 40.0000 mg | DELAYED_RELEASE_CAPSULE | Freq: Every day | ORAL | Status: AC

## 2013-08-04 MED ORDER — DULOXETINE HCL 30 MG PO CPEP: 30.0000 mg | ORAL_CAPSULE | Freq: Every day | ORAL | Status: DC

## 2013-08-04 NOTE — Patient Instructions (Addendum)
Stop Prevacid and start Nexium 40mg  daily.  Start Cymbalta 30mg  daily.  Follow up in 4 weeks.  We will check labs today.

## 2013-08-04 NOTE — Assessment & Plan Note (Signed)
Symptoms improved with Lyrica, but not completely controlled. Will try adding Cymbalta. Follow up with neurology as scheduled.

## 2013-08-04 NOTE — Assessment & Plan Note (Signed)
Lab Results  Component Value Date   HGBA1C 5.4 08/04/2013   Blood sugars low on Glimepiride. Encouraged him to give trial off medication, using diet alone to control BG, given risk of hypoglycemia with Glimepiride. Will plan repeat A1c in 3 months.

## 2013-08-04 NOTE — Assessment & Plan Note (Signed)
Wt Readings from Last 3 Encounters:  08/04/13 257 lb 8 oz (116.801 kg)  05/02/13 261 lb (118.389 kg)  03/17/13 263 lb (119.296 kg)   Congratulated pt on weight loss. Encouraged him to continue with healthy diet.

## 2013-08-04 NOTE — Progress Notes (Signed)
Subjective:    Patient ID: Douglas [name], male    DOB: Dec 24, 1945, 68 y.o.   MRN: [id number]  HPI 68YO male with DM presents for follow up  DM - Does not generally check BG, but when he does, they are well controlled <100. Compliant with healthy, low sugar diet and with medication. No noted low BG.  CKD - recent renal function stable based on labs from Nephrologist. Was started on Enalapril, but stopped this medication because of itching.  Neuropathy - Persistent pain described as burning in hands and feet. Continues on Lyrica with some improvement. Questions if Cymbalta might be helpful.  Review of Systems  Constitutional: Negative for fever, chills, activity change, appetite change, fatigue and unexpected weight change.  Eyes: Negative for visual disturbance.  Respiratory: Negative for cough and shortness of breath.   Cardiovascular: Negative for chest pain, palpitations and leg swelling.  Gastrointestinal: Negative for abdominal pain and abdominal distention.  Genitourinary: Negative for dysuria, urgency and difficulty urinating.  Musculoskeletal: Positive for myalgias. Negative for arthralgias and gait problem.  Skin: Negative for color change and rash.  Hematological: Negative for adenopathy.  Psychiatric/Behavioral: Negative for sleep disturbance and dysphoric mood. The patient is not nervous/anxious.        Objective:    BP 124/72  Pulse 84  Resp 16  Wt 257 lb 8 oz (116.801 kg)  SpO2 98% Physical Exam  Constitutional: He is oriented to person, place, and time. He appears well-developed and well-nourished. No distress.  HENT:  Head: Normocephalic and atraumatic.  Right Ear: External ear normal.  Left Ear: External ear normal.  Nose: Nose normal.  Mouth/Throat: Oropharynx is clear and moist. No oropharyngeal exudate.  Eyes: Conjunctivae and EOM are normal. Pupils are equal, round, and reactive to light. Right eye exhibits no discharge. Left eye exhibits no  discharge. No scleral icterus.  Neck: Normal range of motion. Neck supple. No tracheal deviation present. No thyromegaly present.  Cardiovascular: Normal rate, regular rhythm and normal heart sounds.  Exam reveals no gallop and no friction rub.   No murmur heard. Pulmonary/Chest: Effort normal and breath sounds normal. No accessory muscle usage. Not tachypneic. No respiratory distress. He has no decreased breath sounds. He has no wheezes. He has no rhonchi. He has no rales. He exhibits no tenderness.  Musculoskeletal: Normal range of motion. He exhibits no edema.  Lymphadenopathy:    He has no cervical adenopathy.  Neurological: He is alert and oriented to person, place, and time. No cranial nerve deficit. Coordination normal.  Skin: Skin is warm and dry. No rash noted. He is not diaphoretic. No erythema. No pallor.  Psychiatric: He has a normal mood and affect. His behavior is normal. Judgment and thought content normal.          Assessment & Plan:   Problem List Items Addressed This Visit   Chronic kidney disease     Renal function stable. Will continue current medications. Hold on ACEi because of recent rash with Enalapril. He will discuss possible ARB with Dr. Holley Raring.    Diabetic neuropathy, type II diabetes mellitus     Symptoms improved with Lyrica, but not completely controlled. Will try adding Cymbalta. Follow up with neurology as scheduled.    DM (diabetes mellitus), type 2, uncontrolled, with renal complications - Primary      Lab Results  Component Value Date   HGBA1C 5.4 08/04/2013   Blood sugars low on Glimepiride. Encouraged him to give trial off medication,  using diet alone to control BG, given risk of hypoglycemia with Glimepiride. Will plan repeat A1c in 3 months.    Relevant Orders      Comprehensive metabolic panel (Completed)      Hemoglobin A1c (Completed)      Uric acid (Completed)   Gout   Obesity (BMI 30-39.9)      Wt Readings from Last 3 Encounters:    08/04/13 257 lb 8 oz (116.801 kg)  05/02/13 261 lb (118.389 kg)  03/17/13 263 lb (119.296 kg)   Congratulated pt on weight loss. Encouraged him to continue with healthy diet.    Obstructive sleep apnea     New referral placed for [facility], as his previous home health agency no longer provides CPAP.    Relevant Orders      Ambulatory referral to [facility]       Return in about 4 weeks (around 09/01/2013) for Recheck new medication.

## 2013-08-04 NOTE — Assessment & Plan Note (Signed)
Renal function stable. Will continue current medications. Hold on ACEi because of recent rash with Enalapril. He will discuss possible ARB with Dr. Holley Raring.

## 2013-08-04 NOTE — Progress Notes (Signed)
Pre visit review using our clinic review tool, if applicable. No additional management support is needed unless otherwise documented below in the visit note. 

## 2013-08-04 NOTE — Assessment & Plan Note (Signed)
New referral placed for [facility], as his previous home health agency no longer provides CPAP.

## 2013-09-06 ENCOUNTER — Ambulatory Visit: Payer: Medicare Other | Admitting: Internal Medicine

## 2013-09-13 ENCOUNTER — Ambulatory Visit (INDEPENDENT_AMBULATORY_CARE_PROVIDER_SITE_OTHER): Payer: Medicare Other | Admitting: Internal Medicine

## 2013-09-13 ENCOUNTER — Telehealth: Payer: Self-pay | Admitting: *Deleted

## 2013-09-13 ENCOUNTER — Encounter: Payer: Self-pay | Admitting: Internal Medicine

## 2013-09-13 VITALS — BP 114/76 | HR 79 | Temp 98.5°F | Ht 73.0 in | Wt 254.5 lb

## 2013-09-13 DIAGNOSIS — E669 Obesity, unspecified: Secondary | ICD-10-CM | POA: Diagnosis not present

## 2013-09-13 DIAGNOSIS — E1149 Type 2 diabetes mellitus with other diabetic neurological complication: Secondary | ICD-10-CM | POA: Diagnosis not present

## 2013-09-13 DIAGNOSIS — E1140 Type 2 diabetes mellitus with diabetic neuropathy, unspecified: Secondary | ICD-10-CM

## 2013-09-13 DIAGNOSIS — E1165 Type 2 diabetes mellitus with hyperglycemia: Secondary | ICD-10-CM | POA: Diagnosis not present

## 2013-09-13 DIAGNOSIS — IMO0002 Reserved for concepts with insufficient information to code with codable children: Secondary | ICD-10-CM

## 2013-09-13 DIAGNOSIS — E1129 Type 2 diabetes mellitus with other diabetic kidney complication: Secondary | ICD-10-CM | POA: Diagnosis not present

## 2013-09-13 MED ORDER — GLUCOSE BLOOD VI STRP: ORAL_STRIP | Status: DC

## 2013-09-13 MED ORDER — DULOXETINE HCL 60 MG PO CPEP: 60.0000 mg | ORAL_CAPSULE | Freq: Every day | ORAL | Status: DC

## 2013-09-13 MED ORDER — FREESTYLE FREEDOM LITE W/DEVICE KIT: PACK | Status: DC

## 2013-09-13 NOTE — Telephone Encounter (Signed)
Refill Request  Patient needs Rx for freestyle lite, test strips and a meter

## 2013-09-13 NOTE — Telephone Encounter (Signed)
Rx sent to pharmacy by escript  

## 2013-09-13 NOTE — Assessment & Plan Note (Signed)
No improvement in neuropathic pain with Cymbalta. Will try increasing dose to 60mg  daily. Follow up in 2 months and prn.

## 2013-09-13 NOTE — Assessment & Plan Note (Signed)
Wt Readings from Last 3 Encounters:  09/13/13 254 lb 8 oz (115.44 kg)  08/04/13 257 lb 8 oz (116.801 kg)  05/02/13 261 lb (118.389 kg)   Congratulated pt on weight loss. Encouraged continued healthy diet and exercise as tolerated. Follow up in 10/2013.

## 2013-09-13 NOTE — Progress Notes (Signed)
Subjective:    Patient ID: Douglas [name], male    DOB: 06-20-1945, 68 y.o.   MRN: [id number]  HPI 68YO male presents for follow up. DM -  BG running 80s-low 100s. Compliant with healthy diet.  No improvement in neuropathy with Cymbalta, however overall energy level has improved. Continues to have burning pain in legs and hands. Has started walking some. Generally feeling well. Continues to follow a healthy diet. Has lost another 3lbs.   Review of Systems  Constitutional: Negative for fever, chills, activity change, appetite change, fatigue and unexpected weight change.  Eyes: Negative for visual disturbance.  Respiratory: Negative for cough and shortness of breath.   Cardiovascular: Negative for chest pain, palpitations and leg swelling.  Gastrointestinal: Negative for abdominal pain and abdominal distention.  Genitourinary: Negative for dysuria, urgency and difficulty urinating.  Musculoskeletal: Positive for arthralgias and myalgias. Negative for gait problem.  Skin: Negative for color change and rash.  Neurological: Positive for numbness. Negative for weakness.  Hematological: Negative for adenopathy.  Psychiatric/Behavioral: Negative for sleep disturbance and dysphoric mood. The patient is not nervous/anxious.        Objective:    BP 114/76  Pulse 79  Temp(Src) 98.5 F (36.9 C) (Oral)  Ht 6\' 1"  (1.854 m)  Wt 254 lb 8 oz (115.44 kg)  BMI 33.58 kg/m2  SpO2 96% Physical Exam  Constitutional: He is oriented to person, place, and time. He appears well-developed and well-nourished. No distress.  HENT:  Head: Normocephalic and atraumatic.  Right Ear: External ear normal.  Left Ear: External ear normal.  Nose: Nose normal.  Mouth/Throat: Oropharynx is clear and moist. No oropharyngeal exudate.  Eyes: Conjunctivae and EOM are normal. Pupils are equal, round, and reactive to light. Right eye exhibits no discharge. Left eye exhibits no discharge. No scleral icterus.    Neck: Normal range of motion. Neck supple. No tracheal deviation present. No thyromegaly present.  Cardiovascular: Normal rate, regular rhythm and normal heart sounds.  Exam reveals no gallop and no friction rub.   No murmur heard. Pulmonary/Chest: Effort normal and breath sounds normal. No accessory muscle usage. Not tachypneic. No respiratory distress. He has no decreased breath sounds. He has no wheezes. He has no rhonchi. He has no rales. He exhibits no tenderness.  Musculoskeletal: Normal range of motion. He exhibits no edema.  Lymphadenopathy:    He has no cervical adenopathy.  Neurological: He is alert and oriented to person, place, and time. No cranial nerve deficit. Coordination normal.  Skin: Skin is warm and dry. No rash noted. He is not diaphoretic. No erythema. No pallor.  Psychiatric: He has a normal mood and affect. His behavior is normal. Judgment and thought content normal.          Assessment & Plan:   Problem List Items Addressed This Visit     Unprioritized   Diabetic neuropathy, type II diabetes mellitus - Primary     No improvement in neuropathic pain with Cymbalta. Will try increasing dose to 60mg  daily. Follow up in 2 months and prn.    Relevant Medications      aspirin 81 MG tablet      DULoxetine (CYMBALTA) DR capsule   DM (diabetes mellitus), type 2, uncontrolled, with renal complications      Lab Results  Component Value Date   HGBA1C 5.4 08/04/2013   Excellent control of blood sugars with diet alone. Encouraged continued healthy diet and exercise. Repeat A1c in 10/2013.  Relevant Medications      aspirin 81 MG tablet      glucose blood (EXPRESS MED TEST STRIP PACK) test strip   Other Relevant Orders      Comprehensive metabolic panel      Hemoglobin A1c      Lipid panel      Microalbumin / creatinine urine ratio      Uric acid   Obesity (BMI 30-39.9)      Wt Readings from Last 3 Encounters:  09/13/13 254 lb 8 oz (115.44 kg)  08/04/13 257  lb 8 oz (116.801 kg)  05/02/13 261 lb (118.389 kg)   Congratulated pt on weight loss. Encouraged continued healthy diet and exercise as tolerated. Follow up in 10/2013.        Return in about 2 months (around 11/13/2013).

## 2013-09-13 NOTE — Progress Notes (Signed)
Pre visit review using our clinic review tool, if applicable. No additional management support is needed unless otherwise documented below in the visit note. 

## 2013-09-13 NOTE — Patient Instructions (Signed)
Increase Cymbalta to 60mg  daily.  Follow up in 2 months.

## 2013-09-13 NOTE — Assessment & Plan Note (Signed)
Lab Results  Component Value Date   HGBA1C 5.4 08/04/2013   Excellent control of blood sugars with diet alone. Encouraged continued healthy diet and exercise. Repeat A1c in 10/2013.

## 2013-09-15 ENCOUNTER — Encounter: Payer: Self-pay | Admitting: Neurology

## 2013-09-15 ENCOUNTER — Ambulatory Visit (INDEPENDENT_AMBULATORY_CARE_PROVIDER_SITE_OTHER): Payer: Medicare Other | Admitting: Neurology

## 2013-09-15 VITALS — BP 138/87 | HR 89 | Ht 72.0 in | Wt 250.0 lb

## 2013-09-15 DIAGNOSIS — E1149 Type 2 diabetes mellitus with other diabetic neurological complication: Secondary | ICD-10-CM | POA: Diagnosis not present

## 2013-09-15 DIAGNOSIS — E1140 Type 2 diabetes mellitus with diabetic neuropathy, unspecified: Secondary | ICD-10-CM

## 2013-09-15 DIAGNOSIS — M109 Gout, unspecified: Secondary | ICD-10-CM | POA: Diagnosis not present

## 2013-09-15 DIAGNOSIS — G609 Hereditary and idiopathic neuropathy, unspecified: Secondary | ICD-10-CM

## 2013-09-15 MED ORDER — OXCARBAZEPINE 150 MG PO TABS: 150.0000 mg | ORAL_TABLET | Freq: Two times a day (BID) | ORAL | Status: DC

## 2013-09-15 NOTE — Progress Notes (Signed)
[facility]  PATIENT: Douglas [name] DOB: [birth year]  HISTORY OF PRESENT ILLNESS: Douglas [name] 68 year old male returns for followup. He has a history of bilateral feet and hand paresthesias. He also has a history of diabetes but states it is in good control. Apparently he has tried to eat healthy over the last several months but is frustrated that he has not lost weight. He was unable to afford his Lyrica for a couple of weeks and his pain got much worse however he is back on the drug at present. He was made aware that we have samples of the drug if that ever happens again because I do not want him to be off of the medication. EMG nerve conduction in the past with evidence of length dependent mild axonal peripheral neuropathy. Patient had leg swelling on gabapentin. He did not find the compound cream to be beneficial. He does not exercise.    HISTORY: bilateral feet and hand paresthesias. Also past medical history of gout, obesity, obstructive sleep apnea but could not tolerate CPA, and diabetes for since 2010. He has no distal leg weakness, history of low back pain and occasional right lower extremity shooting pain.   He was placed on Lyrica and currently taking 75 mg 3 times daily. His pain is not completely controlled, without it, he could not stand the pain.  EMG nerve conduction showed evidence of length dependent mild axonal peripheral neuropathy. There is no evidence of right lumbosacral radiculopathy.   Patient has had side effects to gabapentin in the past which cause swelling and  rash. He did not find the cream to be beneficial.  UPDATE June 4th 2015:  He is now taking Lyrican 130m tid, Cymbalta 349mxone month, 6068mday recently. He complains of bilateral hands and feet plantar surface scorching pain,   REVIEW OF SYSTEMS: Full 14 system review of systems performed and notable only for: All negative except where  indicated     ALLERGIES: Allergies   Allergen Reactions  . Atenolol   . Hctz [Hydrochlorothiazide]   . Bee Venom Swelling  . Enalapril     itching    HOME MEDICATIONS: Outpatient Prescriptions Prior to Visit  Medication Sig Dispense Refill  . allopurinol (ZYLOPRIM) 300 MG tablet Take 1 tablet (300 mg total) by mouth daily.  90 tablet  3  . ALPRAZolam (XANAX) 1 MG tablet Take 1 tablet (1 mg total) by mouth 3 (three) times daily as needed for sleep (Take 1/2 tablet - 1 tablet three times a day as needed).  90 tablet  1  . aspirin 81 MG tablet Take 81 mg by mouth daily.      . Blood Glucose Monitoring Suppl (FREESTYLE FREEDOM LITE) W/DEVICE KIT Use as directed  1 each  0  . colchicine 0.6 MG tablet Take 0.6 mg by mouth as needed. Take 2 tablets then an hour later take 2 tablets then next hour take 1 tablet as needed for gout flare.      . doxazosin (CARDURA) 8 MG tablet Take 4 mg by mouth at bedtime.      . DULoxetine (CYMBALTA) 60 MG capsule Take 1 capsule (60 mg total) by mouth daily.  30 capsule  3  . esomeprazole (NEXIUM) 40 MG capsule Take 1 capsule (40 mg total) by mouth daily at 12 noon.  90 capsule  3  . furosemide (LASIX) 40 MG tablet Take 1 tablet (40 mg total) by mouth daily.  90 tablet  3  .  glucose blood (FREESTYLE LITE) test strip Check sugar bid Dx: 250.42  100 each  12  . HYDROcodone-acetaminophen (NORCO/VICODIN) 5-325 MG per tablet Take 1 tablet by mouth every 6 (six) hours as needed for pain.      Marland Kitchen levothyroxine (SYNTHROID, LEVOTHROID) 100 MCG tablet Take 100 mcg everyday except Monday and Wednesday take 200 mcg  90 tablet  3  . Magnesium 500 MG TABS Take by mouth.      . pregabalin (LYRICA) 100 MG capsule Take 1 capsule (100 mg total) by mouth 3 (three) times daily.  90 capsule  5  . simvastatin (ZOCOR) 40 MG tablet 20 mg daily.       . tamoxifen (NOLVADEX) 20 MG tablet Take 20 mg by mouth daily.       . Testosterone Propionate 2 % CREA Place 1 mL onto the skin daily.      Marland Kitchen triamcinolone cream (KENALOG)  0.1 %       . vitamin B-12 (CYANOCOBALAMIN) 1000 MCG tablet Take 1,000 mcg by mouth daily.      . Vitamins A & D (VITAMIN A & D) 8000-400 UNITS CAPS Take by mouth.       No facility-administered medications prior to visit.    PAST MEDICAL HISTORY: Past Medical History  Diagnosis Date  . Neuropathy     diabetes  . Hypertension   . Diabetes mellitus without complication   . Gout   . Depression   . OSA (obstructive sleep apnea)     supplemental oxygen at night  . Syncope and collapse   . Kidney stones     Dr. Rogers Blocker  . S/P cardiac cath 1998    [facility]    PAST SURGICAL HISTORY: Past Surgical History  Procedure Laterality Date  . Knee surgery      arthroscopy  . Back surgery      rupture disc lumbar spine  . Shoulder surgery Bilateral     arthroscopic right, rotator cuff repair left    FAMILY HISTORY: Family History  Problem Relation Age of Onset  . Breast cancer Mother   . Lung cancer Mother   . Bone cancer Mother   . Heart Problems Mother   . Cancer Mother     breast, lung and rib  . AAA (abdominal aortic aneurysm) Mother   . Heart disease Mother   . Heart attack Father   . Heart disease Father   . Heart attack Brother   . Cancer Brother     esophageal    SOCIAL HISTORY: History   Social History  . Marital Status: Married    Spouse Name: [name]    Number of Children: 3  . Years of Education: 13   Occupational History  .      retired   Social History Main Topics  . Smoking status: Never Smoker   . Smokeless tobacco: Never Used  . Alcohol Use: No  . Drug Use: No  . Sexual Activity: Not on file   Other Topics Concern  . Not on file   Social History Narrative   Lives in [place] with his wife ([name]). No children. Three step children. No pets.      Work - Patient is retired. Maintenance supervisor.      School - One year college education.      Right handed.      [employer], served in [place], no combat  PHYSICAL EXAM  Filed Vitals:   09/15/13 1434  BP: 138/87  Pulse: 89  Height: 6' (1.829 m)  Weight: 250 lb (113.399 kg)   Body mass index is 33.9 kg/(m^2).  Generalized: Well developed, obese male in no acute distress  Head: normocephalic and atraumatic,. Oropharynx benign  Neck: Supple, no carotid bruits  Cardiac: Regular rate rhythm, no murmur    Neurological examination   Mentation: Alert oriented to time, place, history taking. Follows all commands speech and language fluent  Cranial nerve II-XII: Pupils were equal round reactive to light extraocular movements were full, visual field were full on confrontational test. Facial sensation and strength were normal. hearing was intact to finger rubbing bilaterally. Uvula tongue midline. head turning and shoulder shrug and were normal and symmetric.Tongue protrusion into cheek strength was normal. Motor: normal bulk and tone, full strength in the BUE, BLE, fine finger movements normal, no pronator drift. No focal weakness Sensory: Length dependent decreased light touch decreased pinprick to the knees bilaterally decreased vibratory to ankles proprioception normal   Coordination: finger-nose-finger, heel-to-shin bilaterally, no dysmetria Reflexes: Brachioradialis 2/2, biceps 2/2, triceps 2/2, patellar 2/2, Achilles absent, plantar responses were flexor bilaterally. Gait and Station: Rising up from seated position without assistance, normal stance,  moderate stride, good arm swing, smooth turning, moderate difficulty  to perform tiptoe, and heel walking. Unsteady with tandem walk   DIAGNOSTIC DATA (LABS, IMAGING, TESTING) - I reviewed patient records, labs, notes, testing and imaging myself where available.      Component Value Date/Time   NA 139 08/04/2013 1408   K 4.1 08/04/2013 1408   CL 100 08/04/2013 1408   CO2 32 08/04/2013 1408   GLUCOSE 142* 08/04/2013 1408   BUN 24* 08/04/2013 1408   CREATININE 1.7* 08/04/2013 1408    CALCIUM 9.9 08/04/2013 1408   PROT 7.0 08/04/2013 1408   ALBUMIN 4.1 08/04/2013 1408   AST 29 08/04/2013 1408   ALT 25 08/04/2013 1408   ALKPHOS 68 08/04/2013 1408   BILITOT 1.2 08/04/2013 1408   Lab Results  Component Value Date   CHOL 108 05/02/2013   HDL 37.60* 05/02/2013   LDLCALC 50 05/02/2013   TRIG 102.0 05/02/2013   CHOLHDL 3 05/02/2013   Lab Results  Component Value Date   HGBA1C 5.4 08/04/2013    Lab Results  Component Value Date   TSH 3.34 05/02/2013    ASSESSMENT AND PLAN  68 y.o. year old male  peripheral neuropathy, he has past medical history of diabetes, hypertension, gout, depression, obstructive sleep apnea, previous EMG nerve conduction demonstrated length dependent mild axonal peripheral neuropathy. Currently on Lyrica 100 mg 3 times daily, recently added on titrating dose of Cymbalta, 60 mg a day, without optimal control of his bilateral hands, and feet neuropathic pain, still complains of 7 out of 10, constant neuropathic pain involving the upper and lower extremities,  Continue Lyrica 158m 3 times daily Cymbalta 60 mg a day, At on Trileptal 150 mg twice a day  If he continues to complain significant pain in his next followup in 6 months with CHoyle Sauer may consider repeat EMG nerve conduction study, to see if there is  any demyelinating features,    [facility] [address] [place] [phone]

## 2013-10-04 ENCOUNTER — Other Ambulatory Visit: Payer: Self-pay

## 2013-10-05 MED ORDER — PREGABALIN 100 MG PO CAPS: 100.0000 mg | ORAL_CAPSULE | Freq: Three times a day (TID) | ORAL | Status: DC

## 2013-10-07 ENCOUNTER — Telehealth: Payer: Self-pay | Admitting: Neurology

## 2013-10-07 NOTE — Telephone Encounter (Signed)
Pt called to find out the status of his prescription pregabalin (LYRICA) 100 MG capsule, called on the 23rd. Pt would like for someone to call him concerning this matter and if it has been sent to the pharmacy. Thanks

## 2013-10-07 NOTE — Telephone Encounter (Signed)
Rx has been sent to the pharmacy.  I called back, got no answer.  Pharmacy says they will contact patient when meds are ready for pick up.

## 2013-10-13 ENCOUNTER — Other Ambulatory Visit: Payer: Self-pay | Admitting: Internal Medicine

## 2013-10-24 ENCOUNTER — Other Ambulatory Visit: Payer: Self-pay | Admitting: *Deleted

## 2013-10-24 MED ORDER — GLUCOSE BLOOD VI STRP: ORAL_STRIP | Status: DC

## 2013-10-31 DIAGNOSIS — E291 Testicular hypofunction: Secondary | ICD-10-CM | POA: Diagnosis not present

## 2013-11-02 DIAGNOSIS — N138 Other obstructive and reflux uropathy: Secondary | ICD-10-CM | POA: Diagnosis not present

## 2013-11-02 DIAGNOSIS — N401 Enlarged prostate with lower urinary tract symptoms: Secondary | ICD-10-CM | POA: Diagnosis not present

## 2013-11-02 DIAGNOSIS — N529 Male erectile dysfunction, unspecified: Secondary | ICD-10-CM | POA: Diagnosis not present

## 2013-11-02 DIAGNOSIS — E291 Testicular hypofunction: Secondary | ICD-10-CM | POA: Diagnosis not present

## 2013-11-03 ENCOUNTER — Other Ambulatory Visit: Payer: Medicare Other

## 2013-11-07 ENCOUNTER — Other Ambulatory Visit (INDEPENDENT_AMBULATORY_CARE_PROVIDER_SITE_OTHER): Payer: Medicare Other

## 2013-11-07 DIAGNOSIS — IMO0002 Reserved for concepts with insufficient information to code with codable children: Secondary | ICD-10-CM

## 2013-11-07 DIAGNOSIS — E1129 Type 2 diabetes mellitus with other diabetic kidney complication: Secondary | ICD-10-CM | POA: Diagnosis not present

## 2013-11-07 DIAGNOSIS — E1165 Type 2 diabetes mellitus with hyperglycemia: Secondary | ICD-10-CM | POA: Diagnosis not present

## 2013-11-07 LAB — LIPID PANEL
Cholesterol: 114 mg/dL (ref 0–200)
HDL: 39 mg/dL — ABNORMAL LOW (ref >39.00)
LDL Cholesterol: 53 mg/dL (ref 0–99)
NonHDL: 75
Total CHOL/HDL Ratio: 3
Triglycerides: 112 mg/dL (ref 0.0–149.0)
VLDL: 22.4 mg/dL (ref 0.0–40.0)

## 2013-11-07 LAB — COMPREHENSIVE METABOLIC PANEL
ALT: 17 U/L (ref 0–53)
AST: 24 U/L (ref 0–37)
Albumin: 3.7 g/dL (ref 3.5–5.2)
Alkaline Phosphatase: 68 U/L (ref 39–117)
BUN: 18 mg/dL (ref 6–23)
CO2: 28 mEq/L (ref 19–32)
Calcium: 9.3 mg/dL (ref 8.4–10.5)
Chloride: 104 mEq/L (ref 96–112)
Creatinine, Ser: 1.6 mg/dL — ABNORMAL HIGH (ref 0.4–1.5)
GFR: 45.2 mL/min — ABNORMAL LOW (ref >60.00)
Glucose, Bld: 140 mg/dL — ABNORMAL HIGH (ref 70–99)
Potassium: 4.4 mEq/L (ref 3.5–5.1)
Sodium: 140 mEq/L (ref 135–145)
Total Bilirubin: 1.1 mg/dL (ref 0.2–1.2)
Total Protein: 6.2 g/dL (ref 6.0–8.3)

## 2013-11-07 LAB — URIC ACID: Uric Acid, Serum: 4.9 mg/dL (ref 4.0–7.8)

## 2013-11-07 LAB — MICROALBUMIN / CREATININE URINE RATIO
Creatinine,U: 72.8 mg/dL
Microalb Creat Ratio: 0.8 mg/g (ref 0.0–30.0)
Microalb, Ur: 0.6 mg/dL (ref 0.0–1.9)

## 2013-11-07 LAB — HEMOGLOBIN A1C: Hgb A1c MFr Bld: 6.6 % — ABNORMAL HIGH (ref 4.6–6.5)

## 2013-11-10 DIAGNOSIS — G4733 Obstructive sleep apnea (adult) (pediatric): Secondary | ICD-10-CM | POA: Diagnosis not present

## 2013-11-14 ENCOUNTER — Encounter: Payer: Self-pay | Admitting: *Deleted

## 2013-11-15 ENCOUNTER — Telehealth: Payer: Self-pay | Admitting: Internal Medicine

## 2013-11-15 NOTE — Telephone Encounter (Signed)
Mandy came in and dropped off a pulse oximetry summary report. Stated patient needs an order for 2 liters of oxygen at night.

## 2013-11-15 NOTE — Telephone Encounter (Signed)
Placed form in Dr. Thomes Dinning box.

## 2013-11-16 NOTE — Telephone Encounter (Signed)
Order form completed and faxed to [facility]

## 2013-11-18 ENCOUNTER — Encounter: Payer: Self-pay | Admitting: Internal Medicine

## 2013-11-18 ENCOUNTER — Ambulatory Visit (INDEPENDENT_AMBULATORY_CARE_PROVIDER_SITE_OTHER): Payer: Medicare Other | Admitting: Internal Medicine

## 2013-11-18 VITALS — BP 100/68 | HR 75 | Temp 98.3°F | Resp 16 | Ht 72.0 in | Wt 244.0 lb

## 2013-11-18 DIAGNOSIS — E1149 Type 2 diabetes mellitus with other diabetic neurological complication: Secondary | ICD-10-CM

## 2013-11-18 DIAGNOSIS — E1165 Type 2 diabetes mellitus with hyperglycemia: Secondary | ICD-10-CM

## 2013-11-18 DIAGNOSIS — E1129 Type 2 diabetes mellitus with other diabetic kidney complication: Secondary | ICD-10-CM | POA: Diagnosis not present

## 2013-11-18 DIAGNOSIS — E669 Obesity, unspecified: Secondary | ICD-10-CM | POA: Diagnosis not present

## 2013-11-18 DIAGNOSIS — N183 Chronic kidney disease, stage 3 unspecified: Secondary | ICD-10-CM | POA: Diagnosis not present

## 2013-11-18 DIAGNOSIS — M1A00X0 Idiopathic chronic gout, unspecified site, without tophus (tophi): Secondary | ICD-10-CM

## 2013-11-18 DIAGNOSIS — IMO0002 Reserved for concepts with insufficient information to code with codable children: Secondary | ICD-10-CM

## 2013-11-18 DIAGNOSIS — M1A3790 Chronic gout due to renal impairment, unspecified ankle and foot, without tophus (tophi): Secondary | ICD-10-CM

## 2013-11-18 DIAGNOSIS — E1140 Type 2 diabetes mellitus with diabetic neuropathy, unspecified: Secondary | ICD-10-CM

## 2013-11-18 DIAGNOSIS — E1142 Type 2 diabetes mellitus with diabetic polyneuropathy: Secondary | ICD-10-CM

## 2013-11-18 DIAGNOSIS — G4733 Obstructive sleep apnea (adult) (pediatric): Secondary | ICD-10-CM

## 2013-11-18 LAB — HM DIABETES FOOT EXAM: HM Diabetic Foot Exam: NORMAL

## 2013-11-18 NOTE — Patient Instructions (Signed)
Labs today.   Follow up in 3 months.  

## 2013-11-18 NOTE — Assessment & Plan Note (Signed)
Symptoms well controlled with Lyrica. Will continue.

## 2013-11-18 NOTE — Assessment & Plan Note (Signed)
Lab Results  Component Value Date   CREATININE 1.6* 11/07/2013   Renal function stable on recent labs.

## 2013-11-18 NOTE — Assessment & Plan Note (Signed)
Lab Results  Component Value Date   HGBA1C 6.6* 11/07/2013   BG recently slightly higher with new medication, Trileptal, however now improving. Will continue to monitor closely. Foot exam normal today except as noted.

## 2013-11-18 NOTE — Progress Notes (Signed)
Subjective:    Patient ID: Douglas [name], male    DOB: 03/23/1946, 68 y.o.   MRN: [id number]  HPI 68YO male presents for follow up.  DM - BG well controlled. Mostly near 100-130.  Felt dizzy after working outside yesterday. Symptoms resolved with rest indoors.  Occasional bilateral toe pain which he attributes to gout. Has not recently taking Colcrys for this. Compliant with allopurinol.  Having trouble affording Lyrica and Nexium.  Review of Systems  Constitutional: Negative for fever, chills, activity change, appetite change, fatigue and unexpected weight change.  Eyes: Negative for visual disturbance.  Respiratory: Negative for cough and shortness of breath.   Cardiovascular: Negative for chest pain, palpitations and leg swelling.  Gastrointestinal: Negative for nausea, vomiting, abdominal pain, diarrhea, constipation and abdominal distention.  Genitourinary: Negative for dysuria, urgency and difficulty urinating.  Musculoskeletal: Positive for arthralgias, back pain and myalgias. Negative for gait problem.  Skin: Negative for color change and rash.  Neurological: Positive for numbness. Negative for weakness.  Hematological: Negative for adenopathy.  Psychiatric/Behavioral: Negative for sleep disturbance and dysphoric mood. The patient is not nervous/anxious.        Objective:    BP 100/68  Pulse 75  Temp(Src) 98.3 F (36.8 C) (Oral)  Resp 16  Ht 6' (1.829 m)  Wt 244 lb (110.678 kg)  BMI 33.09 kg/m2  SpO2 92% Physical Exam  Constitutional: He is oriented to person, place, and time. He appears well-developed and well-nourished. No distress.  HENT:  Head: Normocephalic and atraumatic.  Right Ear: External ear normal.  Left Ear: External ear normal.  Nose: Nose normal.  Mouth/Throat: Oropharynx is clear and moist. No oropharyngeal exudate.  Eyes: Conjunctivae and EOM are normal. Pupils are equal, round, and reactive to light. Right eye exhibits no discharge.  Left eye exhibits no discharge. No scleral icterus.  Neck: Normal range of motion. Neck supple. No tracheal deviation present. No thyromegaly present.  Cardiovascular: Normal rate, regular rhythm and normal heart sounds.  Exam reveals no gallop and no friction rub.   No murmur heard. Pulmonary/Chest: Effort normal and breath sounds normal. No accessory muscle usage. Not tachypneic. No respiratory distress. He has no decreased breath sounds. He has no wheezes. He has no rhonchi. He has no rales. He exhibits no tenderness.  Musculoskeletal: Normal range of motion. He exhibits no edema.  Lymphadenopathy:    He has no cervical adenopathy.  Neurological: He is alert and oriented to person, place, and time. No cranial nerve deficit. Coordination normal.  Skin: Skin is warm and dry. No rash noted. He is not diaphoretic. No erythema. No pallor.  Psychiatric: He has a normal mood and affect. His behavior is normal. Judgment and thought content normal.          Assessment & Plan:   Problem List Items Addressed This Visit     Unprioritized   Chronic kidney disease      Lab Results  Component Value Date   CREATININE 1.6* 11/07/2013   Renal function stable on recent labs.    Diabetic neuropathy, type II diabetes mellitus     Symptoms well controlled with Lyrica. Will continue.    DM (diabetes mellitus), type 2, uncontrolled, with renal complications - Primary      Lab Results  Component Value Date   HGBA1C 6.6* 11/07/2013   BG recently slightly higher with new medication, Trileptal, however now improving. Will continue to monitor closely. Foot exam normal today except as noted.  Gout     Recent uric acid normal. Symptoms generally well controlled. Continue Allopurinol. Prn colcrys    Obesity (BMI 30-39.9)      Wt Readings from Last 3 Encounters:  11/18/13 244 lb (110.678 kg)  09/15/13 250 lb (113.399 kg)  09/13/13 254 lb 8 oz (115.44 kg)   Body mass index is 33.09  kg/(m^2). Encouraged continued healthy diet and exercise with goal of weight loss.        Return in about 3 months (around 02/18/2014) for Recheck of Diabetes.

## 2013-11-18 NOTE — Progress Notes (Signed)
Pre-visit discussion using our clinic review tool. No additional management support is needed unless otherwise documented below in the visit note.  

## 2013-11-18 NOTE — Assessment & Plan Note (Addendum)
Recent uric acid normal. Symptoms generally well controlled. Continue Allopurinol. Prn colcrys

## 2013-11-18 NOTE — Assessment & Plan Note (Signed)
Wt Readings from Last 3 Encounters:  11/18/13 244 lb (110.678 kg)  09/15/13 250 lb (113.399 kg)  09/13/13 254 lb 8 oz (115.44 kg)   Body mass index is 33.09 kg/(m^2). Encouraged continued healthy diet and exercise with goal of weight loss.

## 2013-11-21 NOTE — Telephone Encounter (Signed)
Douglas [name] from [facility] called requesting an addendum to the 8.7.15 OV note to support the need for O2 at night.  "Pt has hypoxia secondary to sleep apnea, pt needs O2 at 2 lpm at night."  She further states Medicare will not accept OSA all by itself.  The fax number is [phone].  Please advise.

## 2013-11-22 NOTE — Assessment & Plan Note (Signed)
Pt has hypoxia secondary to sleep apnea, pt needs O2 at 2 lpm at night.

## 2013-11-22 NOTE — Telephone Encounter (Signed)
OK. Addendum made

## 2013-11-22 NOTE — Telephone Encounter (Signed)
OV faxed to [phone] attn Leafy Ro

## 2013-11-23 LAB — HM DIABETES FOOT EXAM: HM Diabetic Foot Exam: NORMAL

## 2014-01-12 DIAGNOSIS — E291 Testicular hypofunction: Secondary | ICD-10-CM | POA: Diagnosis not present

## 2014-01-12 DIAGNOSIS — Z79899 Other long term (current) drug therapy: Secondary | ICD-10-CM | POA: Diagnosis not present

## 2014-01-12 DIAGNOSIS — N401 Enlarged prostate with lower urinary tract symptoms: Secondary | ICD-10-CM | POA: Diagnosis not present

## 2014-01-24 DIAGNOSIS — N138 Other obstructive and reflux uropathy: Secondary | ICD-10-CM | POA: Diagnosis not present

## 2014-01-24 DIAGNOSIS — E291 Testicular hypofunction: Secondary | ICD-10-CM | POA: Diagnosis not present

## 2014-01-24 DIAGNOSIS — N5201 Erectile dysfunction due to arterial insufficiency: Secondary | ICD-10-CM | POA: Diagnosis not present

## 2014-01-24 DIAGNOSIS — N401 Enlarged prostate with lower urinary tract symptoms: Secondary | ICD-10-CM | POA: Diagnosis not present

## 2014-02-03 ENCOUNTER — Other Ambulatory Visit: Payer: Self-pay | Admitting: Internal Medicine

## 2014-02-11 ENCOUNTER — Other Ambulatory Visit: Payer: Self-pay | Admitting: Internal Medicine

## 2014-02-17 ENCOUNTER — Telehealth: Payer: Self-pay | Admitting: *Deleted

## 2014-02-17 NOTE — Telephone Encounter (Signed)
Left patient a message that is appt time has changed form 930am on 03-17-14 with CM to 10am to see Dr. Krista Blue. Patient can call and reschedule if this time does not work. CM has admin time is the reason for the change.

## 2014-02-20 ENCOUNTER — Ambulatory Visit (INDEPENDENT_AMBULATORY_CARE_PROVIDER_SITE_OTHER): Payer: Medicare Other | Admitting: *Deleted

## 2014-02-20 ENCOUNTER — Encounter: Payer: Self-pay | Admitting: Internal Medicine

## 2014-02-20 ENCOUNTER — Ambulatory Visit (INDEPENDENT_AMBULATORY_CARE_PROVIDER_SITE_OTHER): Payer: Medicare Other | Admitting: Internal Medicine

## 2014-02-20 VITALS — BP 140/90 | HR 80 | Temp 98.1°F | Ht 72.0 in | Wt 240.2 lb

## 2014-02-20 DIAGNOSIS — Z87898 Personal history of other specified conditions: Secondary | ICD-10-CM | POA: Diagnosis not present

## 2014-02-20 DIAGNOSIS — E1140 Type 2 diabetes mellitus with diabetic neuropathy, unspecified: Secondary | ICD-10-CM

## 2014-02-20 DIAGNOSIS — IMO0002 Reserved for concepts with insufficient information to code with codable children: Secondary | ICD-10-CM

## 2014-02-20 DIAGNOSIS — E1129 Type 2 diabetes mellitus with other diabetic kidney complication: Secondary | ICD-10-CM | POA: Diagnosis not present

## 2014-02-20 DIAGNOSIS — N183 Chronic kidney disease, stage 3 unspecified: Secondary | ICD-10-CM

## 2014-02-20 DIAGNOSIS — Z789 Other specified health status: Secondary | ICD-10-CM

## 2014-02-20 DIAGNOSIS — Z23 Encounter for immunization: Secondary | ICD-10-CM

## 2014-02-20 DIAGNOSIS — E669 Obesity, unspecified: Secondary | ICD-10-CM

## 2014-02-20 DIAGNOSIS — E1165 Type 2 diabetes mellitus with hyperglycemia: Secondary | ICD-10-CM | POA: Diagnosis not present

## 2014-02-20 DIAGNOSIS — D696 Thrombocytopenia, unspecified: Secondary | ICD-10-CM | POA: Diagnosis not present

## 2014-02-20 LAB — HEMOGLOBIN A1C: Hgb A1c MFr Bld: 6 % (ref 4.6–6.5)

## 2014-02-20 LAB — MICROALBUMIN / CREATININE URINE RATIO
Creatinine,U: 24.4 mg/dL
Microalb Creat Ratio: 0.4 mg/g (ref 0.0–30.0)
Microalb, Ur: 0.1 mg/dL (ref 0.0–1.9)

## 2014-02-20 LAB — LIPID PANEL
Cholesterol: 124 mg/dL (ref 0–200)
HDL: 40.5 mg/dL (ref >39.00)
LDL Cholesterol: 63 mg/dL (ref 0–99)
NonHDL: 83.5
Total CHOL/HDL Ratio: 3
Triglycerides: 104 mg/dL (ref 0.0–149.0)
VLDL: 20.8 mg/dL (ref 0.0–40.0)

## 2014-02-20 LAB — COMPREHENSIVE METABOLIC PANEL
ALT: 15 U/L (ref 0–53)
AST: 25 U/L (ref 0–37)
Albumin: 3.6 g/dL (ref 3.5–5.2)
Alkaline Phosphatase: 67 U/L (ref 39–117)
BUN: 15 mg/dL (ref 6–23)
CO2: 30 mEq/L (ref 19–32)
Calcium: 9.4 mg/dL (ref 8.4–10.5)
Chloride: 102 mEq/L (ref 96–112)
Creatinine, Ser: 1.5 mg/dL (ref 0.4–1.5)
GFR: 48.61 mL/min — ABNORMAL LOW (ref >60.00)
Glucose, Bld: 129 mg/dL — ABNORMAL HIGH (ref 70–99)
Potassium: 3.9 mEq/L (ref 3.5–5.1)
Sodium: 141 mEq/L (ref 135–145)
Total Bilirubin: 0.5 mg/dL (ref 0.2–1.2)
Total Protein: 6.8 g/dL (ref 6.0–8.3)

## 2014-02-20 MED ORDER — ALPRAZOLAM 1 MG PO TABS: 1.0000 mg | ORAL_TABLET | Freq: Three times a day (TID) | ORAL | Status: DC | PRN

## 2014-02-20 MED ORDER — HYDROCODONE-ACETAMINOPHEN 5-325 MG PO TABS: 1.0000 | ORAL_TABLET | Freq: Three times a day (TID) | ORAL | Status: DC | PRN

## 2014-02-20 NOTE — Progress Notes (Signed)
Pre visit review using our clinic review tool, if applicable. No additional management support is needed unless otherwise documented below in the visit note. 

## 2014-02-20 NOTE — Assessment & Plan Note (Signed)
Symptoms relatively well controlled.Continue Lyrica, Cymbalta.

## 2014-02-20 NOTE — Assessment & Plan Note (Signed)
Will recheck A1c with labs today. Continue healthy diet.

## 2014-02-20 NOTE — Patient Instructions (Signed)
Labs today.  Follow up in 3 months and as needed.

## 2014-02-20 NOTE — Assessment & Plan Note (Signed)
Noted Plt 147 on recent labs from Urology. Will plan to repeat CBC with labs in 04/2014.

## 2014-02-20 NOTE — Progress Notes (Signed)
Subjective:    Patient ID: Douglas [name], male    DOB: 1945-07-17, 68 y.o.   MRN: [id number]  HPI 68YO male presents for follow up.  DM - No improvement in nerve pain with addition of Trileptal. Some improvement with Lyrica and occasionally uses Hydrocodone at night for severe pain.  Urologist recently stopped Tamoxifen. Started back on Testosterone. Had some left breast tenderness with testosterone, so decreased dose to 1mg . This improved with decreased dose. No solid areas noted in breast. Has follow up with urology in Jan.  Review of Systems  Constitutional: Negative for fever, chills, activity change, appetite change, fatigue and unexpected weight change.  Eyes: Negative for visual disturbance.  Respiratory: Negative for cough and shortness of breath.   Cardiovascular: Negative for chest pain, palpitations and leg swelling.  Gastrointestinal: Negative for nausea, vomiting, abdominal pain, diarrhea, constipation and abdominal distention.  Genitourinary: Negative for dysuria, urgency and difficulty urinating.  Musculoskeletal: Positive for myalgias and arthralgias. Negative for gait problem.  Skin: Negative for color change and rash.  Neurological: Positive for numbness. Negative for weakness.  Hematological: Negative for adenopathy.  Psychiatric/Behavioral: Negative for sleep disturbance and dysphoric mood. The patient is not nervous/anxious.        Objective:    BP 140/90 mmHg  Pulse 80  Temp(Src) 98.1 F (36.7 C) (Oral)  Ht 6' (1.829 m)  Wt 240 lb 4 oz (108.977 kg)  BMI 32.58 kg/m2  SpO2 98% Physical Exam  Constitutional: He is oriented to person, place, and time. He appears well-developed and well-nourished. No distress.  HENT:  Head: Normocephalic and atraumatic.  Right Ear: External ear normal.  Left Ear: External ear normal.  Nose: Nose normal.  Mouth/Throat: Oropharynx is clear and moist. No oropharyngeal exudate.  Eyes: Conjunctivae and EOM are normal.  Pupils are equal, round, and reactive to light. Right eye exhibits no discharge. Left eye exhibits no discharge. No scleral icterus.  Neck: Normal range of motion. Neck supple. No tracheal deviation present. No thyromegaly present.  Cardiovascular: Normal rate, regular rhythm and normal heart sounds.  Exam reveals no gallop and no friction rub.   No murmur heard. Pulmonary/Chest: Effort normal and breath sounds normal. No accessory muscle usage. No tachypnea. No respiratory distress. He has no decreased breath sounds. He has no wheezes. He has no rhonchi. He has no rales. He exhibits no tenderness.  Musculoskeletal: Normal range of motion. He exhibits no edema.  Lymphadenopathy:    He has no cervical adenopathy.  Neurological: He is alert and oriented to person, place, and time. No cranial nerve deficit. Coordination normal.  Skin: Skin is warm and dry. No rash noted. He is not diaphoretic. No erythema. No pallor.  Psychiatric: He has a normal mood and affect. His behavior is normal. Judgment and thought content normal.          Assessment & Plan:   Problem List Items Addressed This Visit      Unprioritized   Chronic kidney disease    Will check renal function with labs.    Diabetic neuropathy, type II diabetes mellitus    Symptoms relatively well controlled.Continue Lyrica, Cymbalta.     DM (diabetes mellitus), type 2, uncontrolled, with renal complications - Primary    Will recheck A1c with labs today. Continue healthy diet.    Relevant Orders      Comprehensive metabolic panel      Hemoglobin A1c      Lipid panel  Microalbumin / creatinine urine ratio   Obesity (BMI 30-39.9)    Wt Readings from Last 3 Encounters:  02/20/14 240 lb 4 oz (108.977 kg)  11/18/13 244 lb (110.678 kg)  09/15/13 250 lb (113.399 kg)   Congratulated pt on weight loss. Encouraged continued healthy diet and exercise.    Thrombocytopenia    Noted Plt 147 on recent labs from Urology. Will plan to  repeat CBC with labs in 04/2014.     Other Visit Diagnoses    Rubella immune status not known        Relevant Orders       Measles/Mumps/Rubella Immunity        Return in about 3 months (around 05/23/2014) for Recheck of Diabetes.

## 2014-02-20 NOTE — Assessment & Plan Note (Addendum)
Wt Readings from Last 3 Encounters:  02/20/14 240 lb 4 oz (108.977 kg)  11/18/13 244 lb (110.678 kg)  09/15/13 250 lb (113.399 kg)   Congratulated pt on weight loss. Encouraged continued healthy diet and exercise.

## 2014-02-20 NOTE — Assessment & Plan Note (Signed)
Will check renal function with labs. 

## 2014-02-21 ENCOUNTER — Encounter: Payer: Self-pay | Admitting: *Deleted

## 2014-02-21 LAB — MEASLES/MUMPS/RUBELLA IMMUNITY
Mumps IgG: 186 AU/mL — ABNORMAL HIGH (ref <9.00)
Rubella: 23.4 Index — ABNORMAL HIGH (ref <0.90)
Rubeola IgG: >300 AU/mL — ABNORMAL HIGH (ref <25.00)

## 2014-03-17 ENCOUNTER — Encounter: Payer: Self-pay | Admitting: Neurology

## 2014-03-17 ENCOUNTER — Ambulatory Visit: Payer: Medicare Other | Admitting: Nurse Practitioner

## 2014-03-17 ENCOUNTER — Ambulatory Visit (INDEPENDENT_AMBULATORY_CARE_PROVIDER_SITE_OTHER): Payer: Medicare Other | Admitting: Neurology

## 2014-03-17 DIAGNOSIS — E1140 Type 2 diabetes mellitus with diabetic neuropathy, unspecified: Secondary | ICD-10-CM

## 2014-03-17 MED ORDER — METANX 3-90.314-2-35 MG PO CAPS: ORAL_CAPSULE | ORAL | Status: DC

## 2014-03-17 MED ORDER — CAPSAICIN 0.05 % EX GEL: CUTANEOUS | Status: DC

## 2014-03-17 NOTE — Progress Notes (Signed)
[facility]  PATIENT: Douglas [name] DOB: Dec 09, 1945  HISTORY OF PRESENT ILLNESS: Douglas [name] 68 year old male returns for followup.   He has a history of bilateral feet and hand paresthesias. He also has a history of diabetes since 2000, follow-up for diabetic peripheral neuropathy  He has gout, obesity, obstructive sleep apnea but could not tolerate CPA, and diabetes for since 2000. He has no distal leg weakness, history of low back pain and occasional right lower extremity shooting pain.   EMG nerve conduction showed evidence of length dependent mild axonal peripheral neuropathy. There is no evidence of right lumbosacral radiculopathy.   Patient has had side effects to gabapentin in the past which cause swelling and  rash. He did not find the cream to be beneficial.  Laboratory evaluation in October 2013 showed normal TSH, B12, ESR, C-reactive protein, protein electrophoresis.  He was taking Lyrica 101 3 times a day, but complains of unbearable bilateral feet and fingertips paresthesia, I have added on Trileptal 150 mg twice a day since his previous visit in June 2015, he did not notice any significant benefit,  He continued to be active, driving his grandchildren each day, also complains of chronic low back pain, previously had epidural injection, has been very helpful,  Most recent A1c was 6.0.   REVIEW OF SYSTEMS: Full 14 system review of systems performed and notable only for: As above     ALLERGIES: Allergies  Allergen Reactions  . Atenolol   . Hctz [Hydrochlorothiazide]   . Bee Venom Swelling  . Enalapril     itching    HOME MEDICATIONS: Outpatient Prescriptions Prior to Visit  Medication Sig Dispense Refill  . allopurinol (ZYLOPRIM) 300 MG tablet Take 1 tablet (300 mg total) by mouth daily. 90 tablet 3  . ALPRAZolam (XANAX) 1 MG tablet Take 1 tablet (1 mg total) by mouth 3 (three) times daily as needed for sleep (Take 1/2 tablet - 1  tablet three times a day as needed). 90 tablet 1  . aspirin 81 MG tablet Take 81 mg by mouth daily.    . Blood Glucose Monitoring Suppl (FREESTYLE FREEDOM LITE) W/DEVICE KIT Use as directed 1 each 0  . colchicine 0.6 MG tablet Take 0.6 mg by mouth as needed. Take 2 tablets then an hour later take 2 tablets then next hour take 1 tablet as needed for gout flare.    . doxazosin (CARDURA) 8 MG tablet TAKE 1 TABLET BY MOUTH AT BEDTIME 30 tablet 5  . DULoxetine (CYMBALTA) 60 MG capsule TAKE ONE CAPSULE BY MOUTH DAILY 30 capsule 3  . esomeprazole (NEXIUM) 40 MG capsule Take 1 capsule (40 mg total) by mouth daily at 12 noon. 90 capsule 3  . furosemide (LASIX) 40 MG tablet Take 1 tablet (40 mg total) by mouth daily. 90 tablet 3  . glucose blood (FREESTYLE LITE) test strip Check sugar daily Dx: 250.42 100 each 12  . HYDROcodone-acetaminophen (NORCO/VICODIN) 5-325 MG per tablet Take 1 tablet by mouth 3 (three) times daily as needed. 60 tablet 0  . levothyroxine (SYNTHROID, LEVOTHROID) 100 MCG tablet Take 100 mcg everyday except Monday and Wednesday take 200 mcg 90 tablet 3  . Magnesium 500 MG TABS Take by mouth.    . OXcarbazepine (TRILEPTAL) 150 MG tablet Take 1 tablet (150 mg total) by mouth 2 (two) times daily. 60 tablet 12  . pregabalin (LYRICA) 100 MG capsule Take 1 capsule (100 mg total) by mouth 3 (three) times daily. [place]  capsule 5  . simvastatin (ZOCOR) 40 MG tablet TAKE 1 TABLET BY MOUTH AT BEDTIME 30 tablet 3  . Testosterone Propionate 2 % CREA Place 1 mL onto the skin daily.    Marland Kitchen triamcinolone cream (KENALOG) 0.1 %     . vitamin B-12 (CYANOCOBALAMIN) 1000 MCG tablet Take 1,000 mcg by mouth daily.    . Vitamins A & D (VITAMIN A & D) 8000-400 UNITS CAPS Take by mouth.     No facility-administered medications prior to visit.    PAST MEDICAL HISTORY: Past Medical History  Diagnosis Date  . Neuropathy     diabetes  . Hypertension   . Diabetes mellitus without complication   . Gout   .  Depression   . OSA (obstructive sleep apnea)     supplemental oxygen at night  . Syncope and collapse   . Kidney stones     Dr. Rogers Blocker  . S/P cardiac cath 1998    [facility]    PAST SURGICAL HISTORY: Past Surgical History  Procedure Laterality Date  . Knee surgery      arthroscopy  . Back surgery      rupture disc lumbar spine  . Shoulder surgery Bilateral     arthroscopic right, rotator cuff repair left    FAMILY HISTORY: Family History  Problem Relation Age of Onset  . Breast cancer Mother   . Lung cancer Mother   . Bone cancer Mother   . Heart Problems Mother   . Cancer Mother     breast, lung and rib  . AAA (abdominal aortic aneurysm) Mother   . Heart disease Mother   . Heart attack Father   . Heart disease Father   . Heart attack Brother   . Cancer Brother     esophageal    SOCIAL HISTORY: History   Social History  . Marital Status: Married    Spouse Name: [name]    Number of Children: 3  . Years of Education: 13   Occupational History  .      retired   Social History Main Topics  . Smoking status: Never Smoker   . Smokeless tobacco: Never Used  . Alcohol Use: No  . Drug Use: No  . Sexual Activity: Not on file   Other Topics Concern  . Not on file   Social History Narrative   Lives in [place] with his wife ([name]). No children. Three step children. No pets.      Work - Patient is retired. Maintenance supervisor.      School - One year college education.      Right handed.      [place], served in [place], no combat                 PHYSICAL EXAM  There were no vitals filed for this visit. There is no weight on file to calculate BMI.  Generalized: Well developed, obese male in no acute distress  Head: normocephalic and atraumatic,. Oropharynx benign  Neck: Supple, no carotid bruits  Cardiac: Regular rate rhythm, no murmur    Neurological examination   Mentation: Alert oriented to time, place, history  taking. Follows all commands speech and language fluent  Cranial nerve II-XII: Pupils were equal round reactive to light extraocular movements were full, visual field were full on confrontational test. Facial sensation and strength were normal. hearing was intact to finger rubbing bilaterally. Uvula tongue midline. head turning and shoulder shrug and were normal  and symmetric.Tongue protrusion into cheek strength was normal. Motor: normal bulk and tone, full strength in the BUE, BLE, fine finger movements normal, no pronator drift. No focal weakness Sensory: Length dependent decreased light touch decreased pinprick to mid shin bilaterally decreased vibratory to ankles proprioception normal   Coordination: finger-nose-finger, heel-to-shin bilaterally, no dysmetria Reflexes: Brachioradialis 2/2, biceps 2/2, triceps 2/2, patellar 2/2, Achilles absent, plantar responses were flexor bilaterally. Gait and Station: Rising up from seated position without assistance, normal stance,  moderate stride, good arm swing, smooth turning, moderate difficulty  to perform tiptoe, and heel walking. Unsteady with tandem walk   DIAGNOSTIC DATA (LABS, IMAGING, TESTING) - I reviewed patient records, labs, notes, testing and imaging myself where available.      Component Value Date/Time   NA 141 02/20/2014 0915   K 3.9 02/20/2014 0915   CL 102 02/20/2014 0915   CO2 30 02/20/2014 0915   GLUCOSE 129* 02/20/2014 0915   BUN 15 02/20/2014 0915   CREATININE 1.5 02/20/2014 0915   CALCIUM 9.4 02/20/2014 0915   PROT 6.8 02/20/2014 0915   ALBUMIN 3.6 02/20/2014 0915   AST 25 02/20/2014 0915   ALT 15 02/20/2014 0915   ALKPHOS 67 02/20/2014 0915   BILITOT 0.5 02/20/2014 0915   Lab Results  Component Value Date   CHOL 124 02/20/2014   HDL 40.50 02/20/2014   LDLCALC 63 02/20/2014   TRIG 104.0 02/20/2014   CHOLHDL 3 02/20/2014   Lab Results  Component Value Date   HGBA1C 6.0 02/20/2014    Lab Results  Component  Value Date   TSH 3.34 05/02/2013    ASSESSMENT AND PLAN  68 y.o. year old male   with diabetic peripheral neuropathy,  Continue Lyrica 125m 3 times daily Cymbalta 60 mg a day, Capasaicin cream as needed  Metnax.   YMarcial Pacas M.D. Ph.D.  [facility] [address] SRaynham CenterGSan Antonio [place] [phone]

## 2014-03-29 ENCOUNTER — Other Ambulatory Visit: Payer: Self-pay | Admitting: Internal Medicine

## 2014-03-30 DIAGNOSIS — L578 Other skin changes due to chronic exposure to nonionizing radiation: Secondary | ICD-10-CM | POA: Diagnosis not present

## 2014-03-30 DIAGNOSIS — D485 Neoplasm of uncertain behavior of skin: Secondary | ICD-10-CM | POA: Diagnosis not present

## 2014-03-30 DIAGNOSIS — D1800 Hemangioma unspecified site: Secondary | ICD-10-CM | POA: Diagnosis not present

## 2014-03-30 DIAGNOSIS — D229 Melanocytic nevi, unspecified: Secondary | ICD-10-CM | POA: Diagnosis not present

## 2014-03-30 DIAGNOSIS — L820 Inflamed seborrheic keratosis: Secondary | ICD-10-CM | POA: Diagnosis not present

## 2014-03-30 DIAGNOSIS — L821 Other seborrheic keratosis: Secondary | ICD-10-CM | POA: Diagnosis not present

## 2014-03-30 DIAGNOSIS — L859 Epidermal thickening, unspecified: Secondary | ICD-10-CM | POA: Diagnosis not present

## 2014-03-30 DIAGNOSIS — Z1283 Encounter for screening for malignant neoplasm of skin: Secondary | ICD-10-CM | POA: Diagnosis not present

## 2014-04-05 ENCOUNTER — Other Ambulatory Visit: Payer: Self-pay | Admitting: Internal Medicine

## 2014-04-10 ENCOUNTER — Other Ambulatory Visit: Payer: Self-pay | Admitting: Neurology

## 2014-04-11 ENCOUNTER — Other Ambulatory Visit: Payer: Self-pay

## 2014-04-11 MED ORDER — PREGABALIN 100 MG PO CAPS: 100.0000 mg | ORAL_CAPSULE | Freq: Three times a day (TID) | ORAL | Status: DC

## 2014-04-12 ENCOUNTER — Other Ambulatory Visit: Payer: Self-pay | Admitting: Neurology

## 2014-05-23 ENCOUNTER — Encounter: Payer: Self-pay | Admitting: *Deleted

## 2014-05-23 ENCOUNTER — Encounter: Payer: Self-pay | Admitting: Internal Medicine

## 2014-05-23 ENCOUNTER — Ambulatory Visit (INDEPENDENT_AMBULATORY_CARE_PROVIDER_SITE_OTHER): Payer: Medicare Other | Admitting: Internal Medicine

## 2014-05-23 VITALS — BP 132/83 | HR 78 | Temp 98.4°F | Ht 72.0 in | Wt 247.5 lb

## 2014-05-23 DIAGNOSIS — N183 Chronic kidney disease, stage 3 unspecified: Secondary | ICD-10-CM

## 2014-05-23 DIAGNOSIS — E669 Obesity, unspecified: Secondary | ICD-10-CM | POA: Diagnosis not present

## 2014-05-23 DIAGNOSIS — IMO0002 Reserved for concepts with insufficient information to code with codable children: Secondary | ICD-10-CM

## 2014-05-23 DIAGNOSIS — E1140 Type 2 diabetes mellitus with diabetic neuropathy, unspecified: Secondary | ICD-10-CM

## 2014-05-23 DIAGNOSIS — E1129 Type 2 diabetes mellitus with other diabetic kidney complication: Secondary | ICD-10-CM

## 2014-05-23 DIAGNOSIS — E039 Hypothyroidism, unspecified: Secondary | ICD-10-CM

## 2014-05-23 DIAGNOSIS — E1165 Type 2 diabetes mellitus with hyperglycemia: Secondary | ICD-10-CM

## 2014-05-23 LAB — COMPREHENSIVE METABOLIC PANEL
ALT: 16 U/L (ref 0–53)
AST: 19 U/L (ref 0–37)
Albumin: 4.1 g/dL (ref 3.5–5.2)
Alkaline Phosphatase: 74 U/L (ref 39–117)
BUN: 16 mg/dL (ref 6–23)
CO2: 32 mEq/L (ref 19–32)
Calcium: 9.7 mg/dL (ref 8.4–10.5)
Chloride: 103 mEq/L (ref 96–112)
Creatinine, Ser: 1.4 mg/dL (ref 0.40–1.50)
GFR: 53.41 mL/min — ABNORMAL LOW (ref >60.00)
Glucose, Bld: 139 mg/dL — ABNORMAL HIGH (ref 70–99)
Potassium: 3.9 mEq/L (ref 3.5–5.1)
Sodium: 141 mEq/L (ref 135–145)
Total Bilirubin: 0.9 mg/dL (ref 0.2–1.2)
Total Protein: 6.4 g/dL (ref 6.0–8.3)

## 2014-05-23 LAB — LIPID PANEL
Cholesterol: 128 mg/dL (ref 0–200)
HDL: 46.9 mg/dL (ref >39.00)
LDL Cholesterol: 60 mg/dL (ref 0–99)
NonHDL: 81.1
Total CHOL/HDL Ratio: 3
Triglycerides: 107 mg/dL (ref 0.0–149.0)
VLDL: 21.4 mg/dL (ref 0.0–40.0)

## 2014-05-23 LAB — MICROALBUMIN / CREATININE URINE RATIO
Creatinine,U: 73.4 mg/dL
Microalb Creat Ratio: 1 mg/g (ref 0.0–30.0)
Microalb, Ur: 0.7 mg/dL (ref 0.0–1.9)

## 2014-05-23 LAB — TSH: TSH: 1.14 u[IU]/mL (ref 0.35–4.50)

## 2014-05-23 LAB — HEMOGLOBIN A1C: Hgb A1c MFr Bld: 6.5 % (ref 4.6–6.5)

## 2014-05-23 NOTE — Patient Instructions (Signed)
Labs today.   Follow up in 3 months.  

## 2014-05-23 NOTE — Assessment & Plan Note (Signed)
BG well controlled by report. Will check A1c with labs. Encouraged him to set up eye exam.

## 2014-05-23 NOTE — Assessment & Plan Note (Signed)
Will check TSH with labs. 

## 2014-05-23 NOTE — Progress Notes (Signed)
Pre visit review using our clinic review tool, if applicable. No additional management support is needed unless otherwise documented below in the visit note. 

## 2014-05-23 NOTE — Assessment & Plan Note (Signed)
Will check renal function with labs. 

## 2014-05-23 NOTE — Assessment & Plan Note (Signed)
Wt Readings from Last 3 Encounters:  05/23/14 247 lb 8 oz (112.265 kg)  02/20/14 240 lb 4 oz (108.977 kg)  11/18/13 244 lb (110.678 kg)   Body mass index is 33.56 kg/(m^2). Encouraged healthy diet and exercise.

## 2014-05-23 NOTE — Assessment & Plan Note (Signed)
Symptoms generally well controlled with Lyrica and Cymbalta. Will continue to monitor.

## 2014-05-23 NOTE — Progress Notes (Signed)
Subjective:    Patient ID: Douglas [name], male    DOB: 1946/03/16, 69 y.o.   MRN: [id number]  HPI  [name] presents to follow up DM.  DM - BG mostly low 100s. 2 readings >200.  Having some aching in lower back recently. Rarely takes a Hydrocodone with some improvement. Continues on Lyrica and Cymbalta. Neurologist started him on vitamin Metanx. No improvement with this. Tapered down on Trileptal, off for one week.  Wt Readings from Last 3 Encounters:  05/23/14 247 lb 8 oz (112.265 kg)  02/20/14 240 lb 4 oz (108.977 kg)  11/18/13 244 lb (110.678 kg)    Past medical, surgical, family and social history per today's encounter.  Review of Systems  Constitutional: Negative for fever, chills, activity change, appetite change, fatigue and unexpected weight change.  Eyes: Negative for visual disturbance.  Respiratory: Negative for cough and shortness of breath.   Cardiovascular: Negative for chest pain, palpitations and leg swelling.  Gastrointestinal: Negative for nausea, vomiting, abdominal pain, diarrhea, constipation and abdominal distention.  Genitourinary: Negative for dysuria, urgency and difficulty urinating.  Musculoskeletal: Positive for myalgias, back pain, arthralgias and gait problem.  Skin: Negative for color change and rash.  Neurological: Positive for numbness.  Hematological: Negative for adenopathy.  Psychiatric/Behavioral: Negative for sleep disturbance and dysphoric mood. The patient is not nervous/anxious.        Objective:    BP 132/83 mmHg  Pulse 78  Temp(Src) 98.4 F (36.9 C) (Oral)  Ht 6' (1.829 m)  Wt 247 lb 8 oz (112.265 kg)  BMI 33.56 kg/m2  SpO2 98% Physical Exam  Constitutional: He is oriented to person, place, and time. He appears well-developed and well-nourished. No distress.  HENT:  Head: Normocephalic and atraumatic.  Right Ear: External ear normal.  Left Ear: External ear normal.  Nose: Nose normal.  Mouth/Throat: Oropharynx  is clear and moist. No oropharyngeal exudate.  Eyes: Conjunctivae and EOM are normal. Pupils are equal, round, and reactive to light. Right eye exhibits no discharge. Left eye exhibits no discharge. No scleral icterus.  Neck: Normal range of motion. Neck supple. No tracheal deviation present. No thyromegaly present.  Cardiovascular: Normal rate, regular rhythm and normal heart sounds.  Exam reveals no gallop and no friction rub.   No murmur heard. Pulmonary/Chest: Effort normal and breath sounds normal. No accessory muscle usage. No tachypnea. No respiratory distress. He has no decreased breath sounds. He has no wheezes. He has no rhonchi. He has no rales. He exhibits no tenderness.  Musculoskeletal: Normal range of motion. He exhibits no edema.  Lymphadenopathy:    He has no cervical adenopathy.  Neurological: He is alert and oriented to person, place, and time. No cranial nerve deficit. Coordination normal.  Skin: Skin is warm and dry. No rash noted. He is not diaphoretic. No erythema. No pallor.  Psychiatric: He has a normal mood and affect. His behavior is normal. Judgment and thought content normal.          Assessment & Plan:   Problem List Items Addressed This Visit      Unprioritized   Chronic kidney disease    Will check renal function with labs.      Diabetic neuropathy, type II diabetes mellitus    Symptoms generally well controlled with Lyrica and Cymbalta. Will continue to monitor.      DM (diabetes mellitus), type 2, uncontrolled, with renal complications - Primary    BG well controlled by report. Will check  A1c with labs. Encouraged him to set up eye exam.      Relevant Orders   Comprehensive metabolic panel   Hemoglobin A1c   Microalbumin / creatinine urine ratio   Lipid panel   Hypothyroidism    Will check TSH with labs.      Relevant Orders   TSH   Obesity (BMI 30-39.9)    Wt Readings from Last 3 Encounters:  05/23/14 247 lb 8 oz (112.265 kg)    02/20/14 240 lb 4 oz (108.977 kg)  11/18/13 244 lb (110.678 kg)   Body mass index is 33.56 kg/(m^2). Encouraged healthy diet and exercise.          Return in about 3 months (around 08/21/2014) for Recheck of Diabetes.

## 2014-06-01 ENCOUNTER — Telehealth: Payer: Self-pay | Admitting: Internal Medicine

## 2014-06-01 NOTE — Telephone Encounter (Signed)
Pt called to get the results of his lab work. Please advise pt/msn

## 2014-06-06 NOTE — Telephone Encounter (Signed)
Pt notified of results. Pt states he never received results that were mailed on 05/23/14. I resent lab results via mail also at patients request.

## 2014-06-10 ENCOUNTER — Other Ambulatory Visit: Payer: Self-pay | Admitting: Internal Medicine

## 2014-06-27 ENCOUNTER — Other Ambulatory Visit: Payer: Self-pay | Admitting: Internal Medicine

## 2014-07-31 DIAGNOSIS — N401 Enlarged prostate with lower urinary tract symptoms: Secondary | ICD-10-CM | POA: Diagnosis not present

## 2014-07-31 DIAGNOSIS — E291 Testicular hypofunction: Secondary | ICD-10-CM | POA: Diagnosis not present

## 2014-07-31 DIAGNOSIS — N5201 Erectile dysfunction due to arterial insufficiency: Secondary | ICD-10-CM | POA: Diagnosis not present

## 2014-07-31 DIAGNOSIS — Z125 Encounter for screening for malignant neoplasm of prostate: Secondary | ICD-10-CM | POA: Diagnosis not present

## 2014-08-04 NOTE — H&P (Signed)
PATIENT NAME:  Douglas [name], Douglas [name] MR#:  [id number] DATE OF BIRTH:  24-May-1945  DATE OF ADMISSION:  12/03/2012  PRIMARY CARE PHYSICIAN: Dr. Amie Critchley. The patient is supposed to follow up with Dr. Ronette Deter in 1 week for his first-time visit.   CHIEF COMPLAINT: Dizziness and fall.   HISTORY OF PRESENTING ILLNESS: A 69 year old Caucasian male patient with history of hypertension, diabetes, nephrolithiasis and chronic dizziness presents to the Emergency Room with worsening dizziness and fall today. The patient mentions that his dizziness only happens when he stands up from a sitting or lying down position. Today, it got worse to the point that he fell and presented to the ER. The patient's creatinine is elevated at 3.95, with last known creatinine of 2 in December 2013 when he had nephrolithiasis. He is on Lasix, Benicar, metformin and Bystolic. The patient also mentions that he has chronic on and off diarrhea over the last 2 days. Happens every week. Lasts about 2 days with multiple bouts of large volume liquid stools which resolves on its own.   His blood pressure has been as low as 80/52 while I was in the room.   The patient was seen by his primary care physician about a week back. Was told to take more fluids as his kidney numbers were high and his blood pressure medications were increased.   PAST MEDICAL HISTORY:  1. Hypertension.  2. Diabetes mellitus, type 2.  3. Chronic back pain.  4. Diabetic neuropathy.  5. Chronic left lower extremity edema.  6. Ureteral stones  7. Hypothyroidism.   PAST SURGICAL HISTORY: Back surgeries and shoulder surgeries.   ALLERGIES: GABAPENTIN which caused rash and facial swelling.    FAMILY HISTORY: Diabetes.   REVIEW OF SYSTEMS:  CONSTITUTIONAL: Complains of fatigue and weakness. No weight loss or weight gain.  EYES: No blurred vision, pain, redness.  ENT: No tinnitus, ear pain, hearing loss.  RESPIRATORY: No cough, wheeze, hemoptysis.   CARDIOVASCULAR: No chest pain, orthopnea, arrhythmias.  GASTROINTESTINAL: No nausea, vomiting, diarrhea. Has on and off diarrhea but nothing right now.  GENITOURINARY: No dysuria, hematuria. Has acute renal failure.  ENDOCRINE: No polyuria, nocturia, thyroid problems.  HEMATOLOGIC AND LYMPHATIC: No anemia, easy bruising, bleeding.  INTEGUMENTARY: No acne, rash, lesions.  MUSCULOSKELETAL: Shoulder pain, back pain.  NEUROLOGIC: Has lightheadedness. No focal numbness, weakness, seizures.  PSYCHIATRIC: No anxiety or depression.   HOME MEDICATIONS: Include:  1. Allopurinol 40 mg once a day.  2. B complex 50 oral once a day.  3. Benicar 5 mg 2 tablets once a day.  4. Bystolic 10 mg oral once a day.  5. Vitamin D3 8000 international units oral once a day.  6. Doxepin 10 mg oral once a day.  7. Lasix 20 mg daily.  8. Levothyroxine 100 mcg daily.  9. Lyrica 300 mg daily.  10. Magnesium citrate 100 mg 5 tablets oral once a day.  11. Metformin 500 mg oral 3 times a day.  12. Prevacid 30 mg oral once a day.   SOCIAL HISTORY: The patient used to work as a Therapist, music. Presently, he is retired and at home. Does not smoke. No alcohol. No illicit drugs.   CODE STATUS: FULL CODE.   PHYSICAL EXAMINATION:  VITAL SIGNS: Temperature 97.9, blood pressure of 80/52, pulse 66, saturating 98% on room air.  GENERAL: Obese Caucasian male patient lying in bed.  PSYCHIATRIC: Alert and oriented x3. Mood and affect appropriate. Judgment intact.  HEENT: Atraumatic, normocephalic. Oral mucosa dry  and pink. No pallor. No icterus. Pupils bilaterally equal and reactive to light.  NECK: Supple. No thyromegaly. No palpable lymph nodes. Trachea midline. No carotid bruits. No JVD.  CARDIOVASCULAR: S1, S2, without any murmurs. Peripheral pulses 2+.  RESPIRATORY: Normal work of breathing. Clear to auscultation on both sides.  GASTROINTESTINAL: Soft abdomen, nontender. Bowel sounds present. No  hepatosplenomegaly palpable.  SKIN: Warm and dry. No petechiae, rash, ulcers.  MUSCULOSKELETAL: No joint swelling, redness, effusion of the large joints. Normal muscle tone.  NEUROLOGICAL: Motor strength 5/5 in upper and lower extremities. Sensation to fine touch intact all over. Cranial nerves II through XII intact. Gait not tested.   LABORATORY STUDIES: Glucose 161, BUN 50, creatinine 3.95, sodium 134, potassium 4.1. AST, ALT, alkaline phosphatase, bilirubin normal. Troponin less than 0.02. WBC 7.4, hemoglobin 11.9, platelets of 139.   CT of the head shows no acute abnormalities.   EKG shows normal sinus rhythm. No acute ST-T wave changes.   ASSESSMENT AND PLAN:  1. Acute renal failure over chronic kidney disease. The patient's baseline creatinine is unknown. His last known creatinine in 2013 was 2 but at this point he had right-sided hydronephrosis with ureteral stone. I feel like his baseline creatinine might be lower than that. This has worsened secondary to acute tubular necrosis from dehydration. Will hold his Lasix, Benicar, metformin. He could be dehydrated from being on Lasix and also the on and off diarrhea. He is symptomatic with lightheadedness which is orthostatic. Will bolus normal saline 1 liter stat and fluid resuscitate the patient and follow creatinine. Get kidney ultrasound. Will also get PTH, urine creatinine and protein.  2. Hypotension due to hypovolemia. Hold blood pressure medications and fluid resuscitate.  3. Orthostatic presyncope secondary to dehydration, but the patient has had these symptoms for many years, presently worsened. Likely secondary from neuropathy. Will put the patient in compression stockings. Have physical therapy see the patient.  4. Hypertension. Hold medications.  5. Diabetes mellitus. Sliding scale insulin. Hold metformin secondary to the renal failure.  6. Deep vein thrombosis prophylaxis with heparin subcutaneous.   The patient is critically ill  with hypotension and acute renal failure.   TIME SPENT IN CRITICAL CARE TIME: 50 minutes.    ____________________________ Douglas Alf Isiah Scheel, MD srs:gb D: 12/03/2012 03:16:59 ET T: 12/03/2012 03:31:18 ET JOB#: [id number]  cc: Alveta Heimlich R. Tania Perrott, MD, <Dictator> Eduard Clos. Gilford Rile, MD Alveta Heimlich Arlice Colt MD ELECTRONICALLY SIGNED 12/03/2012 12:36

## 2014-08-04 NOTE — Discharge Summary (Signed)
PATIENT NAME:  Douglas [name], COUPLAND MR#:  [id number] DATE OF BIRTH:  1945/07/28  DATE OF ADMISSION:  12/03/2012 DATE OF DISCHARGE:  12/04/2012  Douglas [name] is a very nice 69 year old gentleman who has been getting his care mostly at [facility]. The patient has contacted a new physician, Dr. Ronette Deter with [facility].   REASONS FOR ADMISSION:  1.  Intravascular volume depletion.  2.  Hypovolemic shock, with hypotension.  3.  Orthostatic presyncope.  4.  History of hypertension.  5.  Type 2 noninsulin-dependent diabetes.  6.  Gout.  7.  Hypothyroidism.  8.  Diabetic neuropathy.  9.  Chronic back pain.  10.  Left lower extremity edema, chronic.  11.  History of ureteral stones.   DISPOSITION: Home.   FOLLOWUP: With Dr. Ronette Deter within the next week.   MEDICATIONS AT DISCHARGE:  1.  Levothyroxine 200 mcg Monday and Wednesday, and 100 mcg the rest of the days.  2.  Vitamin D.  3.  Vitamin B. 4.  Allopurinol 40 mg once a day.  5.  Doxepin 10 mg once a day.  6.  Bystolic, one once a day, 10 mg.  7.  Lyrica 300 mg daily.  8.  Prevacid 30 mg once a day.  9.  Doxazosin 8 mg once a day.  10.  Simvastatin 40 mg at bedtime.  11.  Benicar 5 mg, take 2 tablets once a day.   The patient has been counseled as far as stopping this medication until he is seen by Dr. Gilford Rile and the kidney specialist so they will resume this medication as needed.   12.  Aspirin 81 mg once a day.  13.  Furosemide 20 mg once a day for chronic edema; the patient also has been counseled as far as stopping this medication until he it is approved by the kidneys specialist.  14.  Amaryl 2 mg, 1 tablet twice daily: This is a new medication because the patient cannot take metformin anymore due to his kidney failure.   FOLLOWUP: Dr. Juleen China or Dr. Holley Raring with [facility]:  Phone number [phone].   Follow up with Dr. Ronette Deter in 1 to 2 weeks.   HOSPITAL COURSE: Douglas [name] is a very nice  69 year old gentleman, has a history of diabetes, hypertension, gout, hypothyroidism and chronic back pain, comes to the ER on 12/03/2012 with dizziness after a fall.   The patient states that his dizziness happens only when he stands up and when he changes from a sitting or laying-down position.   The patient states that the day of admission that the problem got worse. He has been experiencing this problem for over 2 weeks. Whenever he was evaluated he had a creatinine of  3.95. The last known creatinine of 2 was done on April 02, 2012.   The patient was surprised about this. The patient has never been told that he has any kidney problems, although we have results from [facility], and his creatinine level was around 2.4, 2.5 within the last 2 weeks. The patient states that his doctor mostly tells him that his kidneys are abnormal, but okay, and he has never had any education about diabetes, about hypertension, or about the implications of the kidneys or the involvement of the kidneys with those 2 diseases.   The patient has actually been taking metformin, which is contraindicated with creatinines above 1.5. The patient has had some diarrhea which apparently is overall chronic, but it got a little bit intensified  for the past couple of days. For 48 hours  the patient had multiple episodes of diarrhea which possibly contributed to his dehydration.   Whenever he was admitted to the ER his blood pressure was 80/52.    IV fluids were given. The patient was admitted for further evaluation.   PROBLEMS:  1.  Acute kidney failure: This is likely the cause of the dizziness, lightheadedness and feeling  bad. The patient is prerenal, with chronic kidney disease, so an acute-on-chronic exacerbation of kidney failure. The patient has been taking metformin which could make him go into lactic acidosis. The patient has been told to stop this medication now. His last creatinine a week and a half ago in [place] was  2.4. The patient was not told to stop any of his medications. He continues to take Benicar, Lasix, and metformin despite the fact that he has poor kidney function. The patient had diarrhea, and on top of that he was to continue his Lasix, so he got profoundly dehydrated. Overall, the patient was given IV fluids. Nephrotoxins were held. We stopped metformin and changed it for Amaryl. We stopped Benicar and Lasix, and we recommended for him to follow up with a kidney specialist. Referral was given for Dr. Holley Raring or Dr. Juleen China, and the patient is going to follow through closely. He is not going to start any Lasix or Benicar until his blood has been rechecked and there are  not any other concerns about damaging his kidneys with these medications.  2.  Hypotension; hypovolemic shock: The patient has very low blood pressures, 80s/40s. IV fluids were given. The patient responded very well.  3.  Orthostatic hypotension: Significant orthostasis; same as above.  4.  Presyncope: This is likely secondary to the orthostatic problems. The patient has not had a full syncope for which we did not do a full workup. For syncope we just treat the underlying cause of his dizziness and lightheadedness which was dehydration.  5.  Hypertension: Medications were held during this hospitalization, and the Benicar was  stopped for now.  6.  Diabetes. The patient has usually well-controlled diabetes with metformin. Unfortunately he cannot take this medication anymore as his creatinine is above 1.5.   The patient is going to be started on Amaryl. This medication likely needs to be adjusted in the clinic.   The patient did well during this hospitalization. His creatinine, which was his main abnormal test, came down from 3.95 to 2.33.   Glucose was 116 to 178. His BUN was 50 on admission, 32 at discharge. His hemoglobin A1c was above 10.   His blood sugars were around 130 to 170s. Troponin was negative. White count was 7.4.  Hemoglobin was 11.9. We did a PTH to evaluate the chronicity of his kidney failure, and in fact the PTH was elevated to 83, which shows that he indeed has chronic kidney disease.   The patient was very pleasant and voiced his concerns about never been indicated for diabetes or hypertension. Since the patient has new appointment with Dr. Gilford Rile we are going to recommend Dr. Gilford Rile to do referrals for outpatient medications if they are not available in her clinic. The patient is discharged in good condition.   I spent about 45 minutes with this discharge.     ____________________________ Granger Sink, MD rsg:dm D: 12/05/2012 07:11:59 ET T: 12/05/2012 10:24:26 ET JOB#: [id number]  cc: Havelock Sink, MD, <Dictator> Eduard Clos. Gilford Rile, MD Mamie Levers, MD Munsoor N.  Holley Raring, MD  Roselie Awkward America Brown MD ELECTRONICALLY SIGNED 12/10/2012 12:45

## 2014-08-22 ENCOUNTER — Encounter: Payer: Self-pay | Admitting: Internal Medicine

## 2014-08-22 ENCOUNTER — Ambulatory Visit (INDEPENDENT_AMBULATORY_CARE_PROVIDER_SITE_OTHER): Payer: Medicare Other | Admitting: Internal Medicine

## 2014-08-22 VITALS — BP 136/78 | HR 72 | Temp 98.2°F | Ht 72.0 in | Wt 261.1 lb

## 2014-08-22 DIAGNOSIS — H109 Unspecified conjunctivitis: Secondary | ICD-10-CM

## 2014-08-22 DIAGNOSIS — E1129 Type 2 diabetes mellitus with other diabetic kidney complication: Secondary | ICD-10-CM | POA: Diagnosis not present

## 2014-08-22 DIAGNOSIS — IMO0002 Reserved for concepts with insufficient information to code with codable children: Secondary | ICD-10-CM

## 2014-08-22 DIAGNOSIS — N183 Chronic kidney disease, stage 3 unspecified: Secondary | ICD-10-CM

## 2014-08-22 DIAGNOSIS — E1165 Type 2 diabetes mellitus with hyperglycemia: Secondary | ICD-10-CM

## 2014-08-22 LAB — HEMOGLOBIN A1C: Hgb A1c MFr Bld: 6.7 % — ABNORMAL HIGH (ref 4.6–6.5)

## 2014-08-22 LAB — LIPID PANEL
Cholesterol: 133 mg/dL (ref 0–200)
HDL: 48.4 mg/dL (ref >39.00)
LDL Cholesterol: 64 mg/dL (ref 0–99)
NonHDL: 84.6
Total CHOL/HDL Ratio: 3
Triglycerides: 101 mg/dL (ref 0.0–149.0)
VLDL: 20.2 mg/dL (ref 0.0–40.0)

## 2014-08-22 LAB — COMPREHENSIVE METABOLIC PANEL
ALT: 13 U/L (ref 0–53)
AST: 17 U/L (ref 0–37)
Albumin: 3.9 g/dL (ref 3.5–5.2)
Alkaline Phosphatase: 81 U/L (ref 39–117)
BUN: 15 mg/dL (ref 6–23)
CO2: 33 mEq/L — ABNORMAL HIGH (ref 19–32)
Calcium: 9.5 mg/dL (ref 8.4–10.5)
Chloride: 101 mEq/L (ref 96–112)
Creatinine, Ser: 1.27 mg/dL (ref 0.40–1.50)
GFR: 59.72 mL/min — ABNORMAL LOW (ref >60.00)
Glucose, Bld: 159 mg/dL — ABNORMAL HIGH (ref 70–99)
Potassium: 3.8 mEq/L (ref 3.5–5.1)
Sodium: 140 mEq/L (ref 135–145)
Total Bilirubin: 0.8 mg/dL (ref 0.2–1.2)
Total Protein: 6.6 g/dL (ref 6.0–8.3)

## 2014-08-22 LAB — MICROALBUMIN / CREATININE URINE RATIO
Creatinine,U: 47.5 mg/dL
Microalb Creat Ratio: 1.5 mg/g (ref 0.0–30.0)
Microalb, Ur: <0.7 mg/dL (ref 0.0–1.9)

## 2014-08-22 MED ORDER — POLYMYXIN B-TRIMETHOPRIM 10000-0.1 UNIT/ML-% OP SOLN: 1.0000 [drp] | OPHTHALMIC | Status: DC

## 2014-08-22 NOTE — Assessment & Plan Note (Signed)
Recheck renal function with labs today.

## 2014-08-22 NOTE — Assessment & Plan Note (Signed)
BG well controlled by report. Will check A1c with labs. Continue current diet control of DM.

## 2014-08-22 NOTE — Progress Notes (Signed)
Pre visit review using our clinic review tool, if applicable. No additional management support is needed unless otherwise documented below in the visit note. 

## 2014-08-22 NOTE — Assessment & Plan Note (Signed)
Symptoms and exam c/w conjunctivitis. Will start Polytrim. Follow up if symptoms not improving.

## 2014-08-22 NOTE — Patient Instructions (Signed)
Labs today.  Start Polytrim eye drops to treat eye infection.  Consider reading the book "Always Hungry" by Isabella Stalling.

## 2014-08-22 NOTE — Progress Notes (Signed)
Subjective:    Patient ID: Douglas [name], male    DOB: [birth year], 69 y.o.   MRN: [id number]  HPI  [name] presents for follow up.  DM - BG running 105-149.  HTN - BP running 120s-160s/70-80s.  Bilateral eye redness and drainage over last 2 weeks with purulent exudate. No fever, chills. No change in vision.  Continues to have chronic pain in his left knee and right shoulder. Symptoms are improved with use of prn Hydrocodone only for severe pain. He has used this approximately 3 times over the last 3 months. He is not ready to seek surgical intervention.  Frustrated by recent weight gain. Notes more carb intake recently.  Wt Readings from Last 3 Encounters:  08/22/14 261 lb 2 oz (118.446 kg)  05/23/14 247 lb 8 oz (112.265 kg)  02/20/14 240 lb 4 oz (108.977 kg)     Past medical, surgical, family and social history per today's encounter.  Review of Systems  Constitutional: Negative for fever, chills, activity change, appetite change, fatigue and unexpected weight change.  Eyes: Positive for discharge and redness. Negative for photophobia and visual disturbance.  Respiratory: Negative for cough and shortness of breath.   Cardiovascular: Negative for chest pain, palpitations and leg swelling.  Gastrointestinal: Negative for nausea, vomiting, abdominal pain, diarrhea, constipation and abdominal distention.  Genitourinary: Negative for dysuria, urgency and difficulty urinating.  Musculoskeletal: Positive for myalgias and arthralgias. Negative for gait problem.  Skin: Negative for color change and rash.  Hematological: Negative for adenopathy.  Psychiatric/Behavioral: Negative for sleep disturbance and dysphoric mood. The patient is not nervous/anxious.        Objective:    BP 136/78 mmHg  Pulse 72  Temp(Src) 98.2 F (36.8 C) (Oral)  Ht 6' (1.829 m)  Wt 261 lb 2 oz (118.446 kg)  BMI 35.41 kg/m2  SpO2 98% Physical Exam  Constitutional: He is oriented to person,  place, and time. He appears well-developed and well-nourished. No distress.  HENT:  Head: Normocephalic and atraumatic.  Right Ear: External ear normal.  Left Ear: External ear normal.  Nose: Nose normal.  Mouth/Throat: Oropharynx is clear and moist. No oropharyngeal exudate.  Eyes: EOM are normal. Pupils are equal, round, and reactive to light. Right eye exhibits discharge and exudate. Left eye exhibits discharge and exudate. Right conjunctiva is injected. Left conjunctiva is injected. No scleral icterus.  Neck: Normal range of motion. Neck supple. No tracheal deviation present. No thyromegaly present.  Cardiovascular: Normal rate, regular rhythm and normal heart sounds.  Exam reveals no gallop and no friction rub.   No murmur heard. Pulmonary/Chest: Effort normal and breath sounds normal. No accessory muscle usage. No tachypnea. No respiratory distress. He has no decreased breath sounds. He has no wheezes. He has no rhonchi. He has no rales. He exhibits no tenderness.  Musculoskeletal: Normal range of motion. He exhibits no edema.       Right shoulder: He exhibits pain. He exhibits normal range of motion, no tenderness, no bony tenderness and normal strength.       Left knee: He exhibits normal range of motion and no swelling.  Lymphadenopathy:    He has no cervical adenopathy.  Neurological: He is alert and oriented to person, place, and time. No cranial nerve deficit. Coordination normal.  Skin: Skin is warm and dry. No rash noted. He is not diaphoretic. No erythema. No pallor.  Psychiatric: He has a normal mood and affect. His behavior is normal. Judgment and thought  content normal.          Assessment & Plan:   Problem List Items Addressed This Visit      Unprioritized   Bilateral conjunctivitis    Symptoms and exam c/w conjunctivitis. Will start Polytrim. Follow up if symptoms not improving.      Relevant Medications   trimethoprim-polymyxin b (POLYTRIM) ophthalmic solution    Chronic kidney disease    Recheck renal function with labs today.      DM (diabetes mellitus), type 2, uncontrolled, with renal complications - Primary    BG well controlled by report. Will check A1c with labs. Continue current diet control of DM.      Relevant Orders   Comprehensive metabolic panel   Hemoglobin A1c   Lipid panel   Microalbumin / creatinine urine ratio       Return in about 3 months (around 11/22/2014) for Recheck of Diabetes.

## 2014-08-23 ENCOUNTER — Encounter: Payer: Self-pay | Admitting: *Deleted

## 2014-09-01 ENCOUNTER — Other Ambulatory Visit: Payer: Self-pay | Admitting: Internal Medicine

## 2014-09-08 ENCOUNTER — Other Ambulatory Visit: Payer: Self-pay | Admitting: Internal Medicine

## 2014-09-25 ENCOUNTER — Encounter: Payer: Self-pay | Admitting: Nurse Practitioner

## 2014-09-25 ENCOUNTER — Ambulatory Visit: Payer: Medicare Other | Admitting: Nurse Practitioner

## 2014-09-25 ENCOUNTER — Ambulatory Visit (INDEPENDENT_AMBULATORY_CARE_PROVIDER_SITE_OTHER): Payer: Medicare Other | Admitting: Nurse Practitioner

## 2014-09-25 ENCOUNTER — Ambulatory Visit: Payer: Medicare Other | Admitting: Neurology

## 2014-09-25 VITALS — BP 157/94 | HR 83 | Ht 71.5 in | Wt 261.4 lb

## 2014-09-25 DIAGNOSIS — E1129 Type 2 diabetes mellitus with other diabetic kidney complication: Secondary | ICD-10-CM | POA: Diagnosis not present

## 2014-09-25 DIAGNOSIS — IMO0002 Reserved for concepts with insufficient information to code with codable children: Secondary | ICD-10-CM

## 2014-09-25 DIAGNOSIS — E1140 Type 2 diabetes mellitus with diabetic neuropathy, unspecified: Secondary | ICD-10-CM | POA: Diagnosis not present

## 2014-09-25 DIAGNOSIS — E669 Obesity, unspecified: Secondary | ICD-10-CM | POA: Diagnosis not present

## 2014-09-25 DIAGNOSIS — E1165 Type 2 diabetes mellitus with hyperglycemia: Secondary | ICD-10-CM | POA: Diagnosis not present

## 2014-09-25 MED ORDER — PREGABALIN 100 MG PO CAPS: 100.0000 mg | ORAL_CAPSULE | Freq: Three times a day (TID) | ORAL | Status: DC

## 2014-09-25 NOTE — Patient Instructions (Signed)
Given information on Metanx.  Continue Cymbalta at current dose Continue Lyrica at current dose will refill Follow-up 6-8 months

## 2014-09-25 NOTE — Progress Notes (Signed)
[facility]  PATIENT: Douglas [name] DOB: 1946-01-30   REASON FOR VISIT: Follow-up for diabetic peripheral neuropathy  HISTORY FROM: Patient    HISTORY OF PRESENT ILLNESS: Douglas [name], 69 year old male returns for follow-up. He was last seen in the office 03/17/2014 by Dr. Krista Blue. In addition to his feet and hand paresthesias he also has gout obstructive sleep apnea but could not tolerate CPAP and he is obese. He is currently on Cymbalta by his primary care Lyrica by this office. In addition he was started on Metanx however he did not see any change in 2 months and he stopped the drug  due to expense. He was told today it usually takes 6 months to notice any changes with Metanx. He gets very little exercise. His most recent hemoglobin A1c was 6.7. He denies any recent falls, he does not use an assistive device. He returns for reevaluation  HISTORY: Douglas [name] 69 year old male returns for followup. He has a history of bilateral feet and hand paresthesias. He also has a history of diabetes since 2000, follow-up for diabetic peripheral neuropathy He has gout, obesity, obstructive sleep apnea but could not tolerate CPA, and diabetes for since 2000. He has no distal leg weakness, history of low back pain and occasional right lower extremity shooting pain. EMG nerve conduction showed evidence of length dependent mild axonal peripheral neuropathy. There is no evidence of right lumbosacral radiculopathy. Patient has had side effects to gabapentin in the past which cause swelling and rash. He did not find the cream to be beneficial.Laboratory evaluation in October 2013 showed normal TSH, B12, ESR, C-reactive protein, protein electrophoresis. He was taking Lyrica 101 3 times a day, but complains of unbearable bilateral feet and fingertips paresthesia, I have added on Trileptal 150 mg twice a day since his previous visit in June 2015, he did not notice any significant benefit, He  continued to be active, driving his grandchildren each day, also complains of chronic low back pain, previously had epidural injection, has been very helpful, Most recent A1c was 6.0.    REVIEW OF SYSTEMS: Full 14 system review of systems performed and notable only for those listed, all others are neg:  Constitutional: neg  Cardiovascular: neg Ear/Nose/Throat: neg  Skin: neg Eyes: neg Respiratory: neg Gastroitestinal: neg  Hematology/Lymphatic: neg  Endocrine: neg Musculoskeletal: Joint pain, back pain, walking difficulty Allergy/Immunology: neg Neurological: neg Psychiatric: neg Sleep : History of obstructive sleep apnea, does not use CPAP   ALLERGIES: Allergies  Allergen Reactions  . Atenolol   . Hctz [Hydrochlorothiazide]   . Bee Venom Swelling  . Enalapril     itching    HOME MEDICATIONS: Outpatient Prescriptions Prior to Visit  Medication Sig Dispense Refill  . allopurinol (ZYLOPRIM) 300 MG tablet TAKE 1 TABLET BY MOUTH ONCE A DAY 90 tablet 3  . ALPRAZolam (XANAX) 1 MG tablet Take 1 tablet (1 mg total) by mouth 3 (three) times daily as needed for sleep (Take 1/2 tablet - 1 tablet three times a day as needed). 90 tablet 1  . aspirin 81 MG tablet Take 81 mg by mouth daily.    . Blood Glucose Monitoring Suppl (FREESTYLE FREEDOM LITE) W/DEVICE KIT Use as directed 1 each 0  . cetirizine (ZYRTEC) 10 MG tablet Take 10 mg by mouth daily.    . colchicine 0.6 MG tablet Take 0.6 mg by mouth as needed. Take 2 tablets then an hour later take 2 tablets then next hour take 1 tablet as  needed for gout flare.    . doxazosin (CARDURA) 8 MG tablet TAKE 1 TABLET BY MOUTH AT BEDTIME 30 tablet 5  . DULoxetine (CYMBALTA) 60 MG capsule TAKE ONE CAPSULE BY MOUTH DAILY 30 capsule 3  . esomeprazole (NEXIUM) 40 MG capsule Take 1 capsule (40 mg total) by mouth daily at 12 noon. 90 capsule 3  . furosemide (LASIX) 40 MG tablet TAKE 1 TABLET BY MOUTH ONCE A DAY 90 tablet 3  . glucose blood  (FREESTYLE LITE) test strip Check sugar daily Dx: 250.42 100 each 12  . HYDROcodone-acetaminophen (NORCO/VICODIN) 5-325 MG per tablet Take 1 tablet by mouth 3 (three) times daily as needed. 60 tablet 0  . L-Methylfolate-Algae-B12-B6 (METANX) 3-90.314-2-35 MG CAPS One po bid 60 capsule 11  . levothyroxine (SYNTHROID, LEVOTHROID) 100 MCG tablet TAKE 1 TABLET BY MOUTH EVERY DAY EXCEPT MONDAY AND WEDNESDAY. TAKE 2 TABLETS ON MON. AND WED 90 tablet 0  . Magnesium 500 MG TABS Take by mouth.    . pregabalin (LYRICA) 100 MG capsule Take 1 capsule (100 mg total) by mouth 3 (three) times daily. 90 capsule 5  . simvastatin (ZOCOR) 40 MG tablet TAKE 1 TABLET BY MOUTH AT BEDTIME 30 tablet 3  . Testosterone Propionate 2 % CREA Place 1 mL onto the skin daily.    Marland Kitchen triamcinolone cream (KENALOG) 0.1 %     . trimethoprim-polymyxin b (POLYTRIM) ophthalmic solution Place 1 drop into both eyes every 4 (four) hours. 10 mL 0  . vitamin B-12 (CYANOCOBALAMIN) 1000 MCG tablet Take 500 mcg by mouth daily.     . Vitamins A & D (VITAMIN A & D) 8000-400 UNITS CAPS Take by mouth.     No facility-administered medications prior to visit.    PAST MEDICAL HISTORY: Past Medical History  Diagnosis Date  . Neuropathy     diabetes  . Hypertension   . Diabetes mellitus without complication   . Gout   . Depression   . OSA (obstructive sleep apnea)     supplemental oxygen at night  . Syncope and collapse   . Kidney stones     Dr. Rogers Blocker  . S/P cardiac cath 1998    [facility]    PAST SURGICAL HISTORY: Past Surgical History  Procedure Laterality Date  . Knee surgery      arthroscopy  . Back surgery      rupture disc lumbar spine  . Shoulder surgery Bilateral     arthroscopic right, rotator cuff repair left    FAMILY HISTORY: Family History  Problem Relation Age of Onset  . Breast cancer Mother   . Lung cancer Mother   . Bone cancer Mother   . Heart Problems Mother   . Cancer Mother     breast, lung and rib  . AAA  (abdominal aortic aneurysm) Mother   . Heart disease Mother   . Heart attack Father   . Heart disease Father   . Heart attack Brother   . Cancer Brother     esophageal    SOCIAL HISTORY: History   Social History  . Marital Status: Married    Spouse Name: [name]  . Number of Children: 3  . Years of Education: 13   Occupational History  .      retired   Social History Main Topics  . Smoking status: Never Smoker   . Smokeless tobacco: Never Used  . Alcohol Use: No  . Drug Use: No  . Sexual Activity: Not on  file   Other Topics Concern  . Not on file   Social History Narrative   Lives in [place] with his wife ([name]). No children. Three step children. No pets.      Work - Patient is retired. Maintenance supervisor.      School - One year college education.      Right handed.      [place], served in [place], no combat                 PHYSICAL EXAM  Filed Vitals:   09/25/14 1343  BP: 157/94  Pulse: 83  Height: 5' 11.5" (1.816 m)  Weight: 261 lb 6.4 oz (118.57 kg)   Body mass index is 35.95 kg/(m^2). Generalized: Well developed, obese male in no acute distress  Head: normocephalic and atraumatic,. Oropharynx benign  Neck: Supple, no carotid bruits  Cardiac: Regular rate rhythm, no murmur    Neurological examination   Mentation: Alert oriented to time, place, history taking. Follows all commands speech and language fluent  Cranial nerve II-XII: Pupils were equal round reactive to light extraocular movements were full, visual field were full on confrontational test. Facial sensation and strength were normal. hearing was intact to finger rubbing bilaterally. Uvula tongue midline. head turning and shoulder shrug and were normal and symmetric.Tongue protrusion into cheek strength was normal. Motor: normal bulk and tone, full strength in the BUE, BLE, fine finger movements normal, no pronator drift. No focal weakness Sensory: Length  dependent decreased light touch decreased pinprick to mid shin bilaterally decreased vibratory to ankles proprioception normal  Coordination: finger-nose-finger, heel-to-shin bilaterally, no dysmetria Reflexes: Brachioradialis 2/2, biceps 2/2, triceps 2/2, patellar 2/2, Achilles absent, plantar responses were flexor bilaterally. Gait and Station: Rising up from seated position without assistance, normal stance, moderate stride, good arm swing, smooth turning, moderate difficulty to perform tiptoe, and heel walking. Unsteady with tandem walk . No assistive device DIAGNOSTIC DATA (LABS, IMAGING, TESTING) - I reviewed patient records, labs, notes, testing and imaging myself where available.      Component Value Date/Time   NA 140 08/22/2014 1054   NA 142 12/04/2012 0414   K 3.8 08/22/2014 1054   K 4.4 12/04/2012 0414   CL 101 08/22/2014 1054   CL 110* 12/04/2012 0414   CO2 33* 08/22/2014 1054   CO2 26 12/04/2012 0414   GLUCOSE 159* 08/22/2014 1054   GLUCOSE 116* 12/04/2012 0414   BUN 15 08/22/2014 1054   BUN 32* 12/04/2012 0414   CREATININE 1.27 08/22/2014 1054   CREATININE 2.33* 12/04/2012 0414   CALCIUM 9.5 08/22/2014 1054   CALCIUM 9.1 12/04/2012 0414   PROT 6.6 08/22/2014 1054   PROT 6.8 12/02/2012 2139   ALBUMIN 3.9 08/22/2014 1054   ALBUMIN 3.7 12/02/2012 2139   AST 17 08/22/2014 1054   AST 81* 12/02/2012 2139   ALT 13 08/22/2014 1054   ALT 74 12/02/2012 2139   ALKPHOS 81 08/22/2014 1054   ALKPHOS 64 12/02/2012 2139   BILITOT 0.8 08/22/2014 1054   GFRNONAA 28* 12/04/2012 0414   GFRAA 32* 12/04/2012 0414   Lab Results  Component Value Date   CHOL 133 08/22/2014   HDL 48.40 08/22/2014   LDLCALC 64 08/22/2014   TRIG 101.0 08/22/2014   CHOLHDL 3 08/22/2014   Lab Results  Component Value Date   HGBA1C 6.7* 08/22/2014    Lab Results  Component Value Date   TSH 1.14 05/23/2014      ASSESSMENT AND PLAN  69 y.o. year old male  has a past medical history of  diabetic peripheral neuropathy; Hypertension; Diabetes mellitus without complication; Gout; Depression; OSA (obstructive sleep apnea); here to follow-up for his diabetic neuropathy. He only took his Metanx for 2 months. He remains on Lyrica and Cymbalta.  Given information on Metanx. Once patient reads the  information he will call back if he is interested in getting this restarted. Continue Cymbalta at current dose refilled by primary care Continue Lyrica at current dose will refill Follow-up 6-8 months Dennie Bible, [facility], [facility], [facility] [address], [place], [place] [address] [phone]

## 2014-09-28 ENCOUNTER — Telehealth: Payer: Self-pay | Admitting: Nurse Practitioner

## 2014-09-28 NOTE — Progress Notes (Signed)
I have reviewed and agreed above plan. 

## 2014-09-28 NOTE — Telephone Encounter (Signed)
Patient is calling in response to Rx Metanx.  He has taken 2 months and would like to continue.  Also, he would like for you to get directly from manufacturer to assist with cost if possible.  Please call

## 2014-09-29 MED ORDER — L-METHYLFOLATE-B6-B12 3-35-2 MG PO TABS: 1.0000 | ORAL_TABLET | Freq: Two times a day (BID) | ORAL | Status: DC

## 2014-09-29 NOTE — Telephone Encounter (Signed)
Rx has been added and sent to [facility].  I called back.  Patient is aware.

## 2014-09-29 NOTE — Telephone Encounter (Signed)
Janett Billow I had discussed getting from the manufacturer. Can you take care of this and notify patient. Thanks

## 2014-10-13 ENCOUNTER — Other Ambulatory Visit: Payer: Self-pay | Admitting: Internal Medicine

## 2014-10-13 ENCOUNTER — Other Ambulatory Visit: Payer: Self-pay | Admitting: Neurology

## 2014-10-13 NOTE — Telephone Encounter (Signed)
Duplicate

## 2014-10-13 NOTE — Telephone Encounter (Signed)
Pharmacy verified they do have Rx, asked that we disregard this request.

## 2014-11-01 ENCOUNTER — Other Ambulatory Visit: Payer: Self-pay | Admitting: Neurology

## 2014-11-01 NOTE — Telephone Encounter (Signed)
Duplicate.  6 month Rx written on 06/13

## 2014-11-07 ENCOUNTER — Telehealth: Payer: Self-pay | Admitting: Internal Medicine

## 2014-11-07 NOTE — Telephone Encounter (Addendum)
[employer] needs an addendum add to 08/22/14 note that the pt has night time oxygen. This is for the purpose of insurance. Please advise Leonides Cave (may send msg in EPIC)/msn

## 2014-11-09 ENCOUNTER — Other Ambulatory Visit: Payer: Self-pay | Admitting: Internal Medicine

## 2014-11-17 NOTE — Telephone Encounter (Signed)
I have OV note printed at my desk with Dr Derry Skill note as requested, unable to send to [facility] because I have no contact name/number or fax.

## 2014-11-24 ENCOUNTER — Encounter: Payer: Self-pay | Admitting: Internal Medicine

## 2014-11-24 ENCOUNTER — Ambulatory Visit (INDEPENDENT_AMBULATORY_CARE_PROVIDER_SITE_OTHER): Payer: Medicare Other | Admitting: Internal Medicine

## 2014-11-24 VITALS — BP 124/72 | HR 80 | Temp 98.2°F | Ht 72.0 in | Wt 257.2 lb

## 2014-11-24 DIAGNOSIS — E669 Obesity, unspecified: Secondary | ICD-10-CM

## 2014-11-24 DIAGNOSIS — N183 Chronic kidney disease, stage 3 unspecified: Secondary | ICD-10-CM

## 2014-11-24 DIAGNOSIS — E1165 Type 2 diabetes mellitus with hyperglycemia: Secondary | ICD-10-CM | POA: Diagnosis not present

## 2014-11-24 DIAGNOSIS — E039 Hypothyroidism, unspecified: Secondary | ICD-10-CM

## 2014-11-24 DIAGNOSIS — L989 Disorder of the skin and subcutaneous tissue, unspecified: Secondary | ICD-10-CM

## 2014-11-24 DIAGNOSIS — E1129 Type 2 diabetes mellitus with other diabetic kidney complication: Secondary | ICD-10-CM | POA: Diagnosis not present

## 2014-11-24 DIAGNOSIS — IMO0002 Reserved for concepts with insufficient information to code with codable children: Secondary | ICD-10-CM

## 2014-11-24 LAB — MICROALBUMIN / CREATININE URINE RATIO
Creatinine,U: 41.2 mg/dL
Microalb Creat Ratio: 1.7 mg/g (ref 0.0–30.0)
Microalb, Ur: <0.7 mg/dL (ref 0.0–1.9)

## 2014-11-24 LAB — TSH: TSH: 1.71 u[IU]/mL (ref 0.35–4.50)

## 2014-11-24 LAB — HM DIABETES FOOT EXAM: HM Diabetic Foot Exam: NORMAL

## 2014-11-24 LAB — COMPREHENSIVE METABOLIC PANEL
ALT: 20 U/L (ref 0–53)
AST: 24 U/L (ref 0–37)
Albumin: 4.1 g/dL (ref 3.5–5.2)
Alkaline Phosphatase: 87 U/L (ref 39–117)
BUN: 12 mg/dL (ref 6–23)
CO2: 32 mEq/L (ref 19–32)
Calcium: 9.8 mg/dL (ref 8.4–10.5)
Chloride: 97 mEq/L (ref 96–112)
Creatinine, Ser: 1.39 mg/dL (ref 0.40–1.50)
GFR: 53.77 mL/min — ABNORMAL LOW (ref >60.00)
Glucose, Bld: 180 mg/dL — ABNORMAL HIGH (ref 70–99)
Potassium: 3.8 mEq/L (ref 3.5–5.1)
Sodium: 139 mEq/L (ref 135–145)
Total Bilirubin: 0.9 mg/dL (ref 0.2–1.2)
Total Protein: 7.1 g/dL (ref 6.0–8.3)

## 2014-11-24 LAB — LIPID PANEL
Cholesterol: 136 mg/dL (ref 0–200)
HDL: 41.3 mg/dL (ref >39.00)
LDL Cholesterol: 69 mg/dL (ref 0–99)
NonHDL: 95.13
Total CHOL/HDL Ratio: 3
Triglycerides: 132 mg/dL (ref 0.0–149.0)
VLDL: 26.4 mg/dL (ref 0.0–40.0)

## 2014-11-24 LAB — HEMOGLOBIN A1C: Hgb A1c MFr Bld: 7.4 % — ABNORMAL HIGH (ref 4.6–6.5)

## 2014-11-24 NOTE — Progress Notes (Signed)
Subjective:    Patient ID: Douglas [name], male    DOB: May 07, 1945, 69 y.o.   MRN: [id number]  HPI  [name] presents for follow up.  DM -  BG mostly near 150s. Compliant with medication.   HTN - BP at home mostly 130-160s/70-80s. Compliant with medication. No CP, HA.  Neuropathy - Started on Metanx, a supplement recommended by neuropathy. Some improvement with this. Has only used 1 pain pill since last visit.  Skin lesion - Notes nodule on mid posterior neck. Present x >1 year. Non-painful. Non-tender. No change in size. No drainage.  Wt Readings from Last 3 Encounters:  11/24/14 257 lb 4 oz (116.688 kg)  09/25/14 261 lb 6.4 oz (118.57 kg)  08/22/14 261 lb 2 oz (118.446 kg)     Past Medical History  Diagnosis Date  . Neuropathy     diabetes  . Hypertension   . Diabetes mellitus without complication   . Gout   . Depression   . OSA (obstructive sleep apnea)     supplemental oxygen at night  . Syncope and collapse   . Kidney stones     Dr. Rogers Blocker  . S/P cardiac cath 1998    [facility]   Family History  Problem Relation Age of Onset  . Breast cancer Mother   . Lung cancer Mother   . Bone cancer Mother   . Heart Problems Mother   . Cancer Mother     breast, lung and rib  . AAA (abdominal aortic aneurysm) Mother   . Heart disease Mother   . Heart attack Father   . Heart disease Father   . Heart attack Brother   . Cancer Brother     esophageal   Past Surgical History  Procedure Laterality Date  . Knee surgery      arthroscopy  . Back surgery      rupture disc lumbar spine  . Shoulder surgery Bilateral     arthroscopic right, rotator cuff repair left   Social History   Social History  . Marital Status: Married    Spouse Name: [name]  . Number of Children: 3  . Years of Education: 13   Occupational History  .      retired   Social History Main Topics  . Smoking status: Never Smoker   . Smokeless tobacco: Never Used  . Alcohol Use: No  . Drug  Use: No  . Sexual Activity: Not Asked   Other Topics Concern  . None   Social History Narrative   Lives in [place] with his wife ([name]). No children. Three step children. No pets.      Work - Patient is retired. Maintenance supervisor.      School - One year college education.      Right handed.      [place], served in [place], no combat                Review of Systems  Constitutional: Negative for fever, chills, activity change, appetite change, fatigue and unexpected weight change.  Eyes: Negative for visual disturbance.  Respiratory: Negative for cough and shortness of breath.   Cardiovascular: Negative for chest pain, palpitations and leg swelling.  Gastrointestinal: Negative for nausea, vomiting, abdominal pain, diarrhea, constipation and abdominal distention.  Genitourinary: Negative for dysuria, urgency and difficulty urinating.  Musculoskeletal: Positive for myalgias, back pain and arthralgias. Negative for gait problem.  Skin: Negative for color change, rash and wound.  Neurological: Positive for weakness (chronic) and numbness. Negative for headaches.  Hematological: Negative for adenopathy.  Psychiatric/Behavioral: Negative for sleep disturbance and dysphoric mood. The patient is not nervous/anxious.        Objective:    BP 124/72 mmHg  Pulse 80  Temp(Src) 98.2 F (36.8 C) (Oral)  Ht 6' (1.829 m)  Wt 257 lb 4 oz (116.688 kg)  BMI 34.88 kg/m2  SpO2 95% Physical Exam  Constitutional: He is oriented to person, place, and time. He appears well-developed and well-nourished. No distress.  HENT:  Head: Normocephalic and atraumatic.  Right Ear: External ear normal.  Left Ear: External ear normal.  Nose: Nose normal.  Mouth/Throat: Oropharynx is clear and moist. No oropharyngeal exudate.  Eyes: Conjunctivae and EOM are normal. Pupils are equal, round, and reactive to light. Right eye exhibits no discharge. Left eye exhibits no  discharge. No scleral icterus.  Neck: Normal range of motion. Neck supple. No tracheal deviation present. No thyromegaly present.  Cardiovascular: Normal rate, regular rhythm and normal heart sounds.  Exam reveals no gallop and no friction rub.   No murmur heard. Pulmonary/Chest: Effort normal and breath sounds normal. No accessory muscle usage. No tachypnea. No respiratory distress. He has no decreased breath sounds. He has no wheezes. He has no rhonchi. He has no rales. He exhibits no tenderness.  Musculoskeletal: Normal range of motion. He exhibits no edema.  Lymphadenopathy:    He has no cervical adenopathy.  Neurological: He is alert and oriented to person, place, and time. No cranial nerve deficit. Coordination normal.  Skin: Skin is warm and dry. No rash noted. He is not diaphoretic. No erythema. No pallor.     Psychiatric: He has a normal mood and affect. His behavior is normal. Judgment and thought content normal.          Assessment & Plan:   Problem List Items Addressed This Visit      Unprioritized   Chronic kidney disease    Recent improvement in kidney function. Will repeat CMP with labs.      DM (diabetes mellitus), type 2, uncontrolled, with renal complications - Primary    BG fairly well controlled. Continue current medications. A1c with labs. Encouraged follow up with podiatry and for eye exam.      Relevant Orders   Comprehensive metabolic panel   Hemoglobin A1c   Lipid panel   Microalbumin / creatinine urine ratio   Hypothyroidism    Will check TSH with labs. Continue Levothyroxine.      Relevant Orders   TSH   Obesity (BMI 30-39.9)    Wt Readings from Last 3 Encounters:  11/24/14 257 lb 4 oz (116.688 kg)  09/25/14 261 lb 6.4 oz (118.57 kg)  08/22/14 261 lb 2 oz (118.446 kg)   Body mass index is 34.88 kg/(m^2). Encouraged healthy diet and exercise.      Skin lesion    Discussed likely benign nature of this lesion. He will follow up with  dermatology to discuss possible excision.          Return in about 3 months (around 02/24/2015) for Recheck of Diabetes.

## 2014-11-24 NOTE — Assessment & Plan Note (Signed)
BG fairly well controlled. Continue current medications. A1c with labs. Encouraged follow up with podiatry and for eye exam.

## 2014-11-24 NOTE — Assessment & Plan Note (Signed)
Wt Readings from Last 3 Encounters:  11/24/14 257 lb 4 oz (116.688 kg)  09/25/14 261 lb 6.4 oz (118.57 kg)  08/22/14 261 lb 2 oz (118.446 kg)   Body mass index is 34.88 kg/(m^2). Encouraged healthy diet and exercise.

## 2014-11-24 NOTE — Patient Instructions (Signed)
Labs today.  Follow up in 3 months or sooner as needed. 

## 2014-11-24 NOTE — Progress Notes (Signed)
Pre visit review using our clinic review tool, if applicable. No additional management support is needed unless otherwise documented below in the visit note. 

## 2014-11-24 NOTE — Assessment & Plan Note (Signed)
Will check TSH with labs. Continue Levothyroxine. 

## 2014-11-24 NOTE — Assessment & Plan Note (Signed)
Recent improvement in kidney function. Will repeat CMP with labs.

## 2014-11-24 NOTE — Assessment & Plan Note (Signed)
Discussed likely benign nature of this lesion. He will follow up with dermatology to discuss possible excision.

## 2014-11-27 ENCOUNTER — Other Ambulatory Visit: Payer: Self-pay | Admitting: Internal Medicine

## 2014-12-04 ENCOUNTER — Other Ambulatory Visit: Payer: Self-pay | Admitting: Internal Medicine

## 2014-12-13 DIAGNOSIS — R0902 Hypoxemia: Secondary | ICD-10-CM | POA: Diagnosis not present

## 2015-01-22 ENCOUNTER — Other Ambulatory Visit: Payer: Self-pay | Admitting: Internal Medicine

## 2015-01-27 LAB — PSA: PSA: 0.81

## 2015-01-29 ENCOUNTER — Other Ambulatory Visit: Payer: Self-pay | Admitting: Internal Medicine

## 2015-01-29 DIAGNOSIS — R3915 Urgency of urination: Secondary | ICD-10-CM | POA: Diagnosis not present

## 2015-01-29 DIAGNOSIS — N401 Enlarged prostate with lower urinary tract symptoms: Secondary | ICD-10-CM | POA: Diagnosis not present

## 2015-01-29 DIAGNOSIS — N138 Other obstructive and reflux uropathy: Secondary | ICD-10-CM | POA: Diagnosis not present

## 2015-01-29 DIAGNOSIS — R350 Frequency of micturition: Secondary | ICD-10-CM | POA: Diagnosis not present

## 2015-01-30 DIAGNOSIS — N401 Enlarged prostate with lower urinary tract symptoms: Secondary | ICD-10-CM | POA: Diagnosis not present

## 2015-01-30 DIAGNOSIS — E291 Testicular hypofunction: Secondary | ICD-10-CM | POA: Diagnosis not present

## 2015-02-01 ENCOUNTER — Other Ambulatory Visit: Payer: Self-pay

## 2015-02-01 ENCOUNTER — Telehealth: Payer: Self-pay | Admitting: Internal Medicine

## 2015-02-01 MED ORDER — GLUCOSE BLOOD VI STRP: ORAL_STRIP | Status: DC

## 2015-02-01 NOTE — Telephone Encounter (Signed)
Spoke with the pharmacy, re faxed over a prescription for test strips with ICD 10 code.

## 2015-02-01 NOTE — Telephone Encounter (Signed)
Pt called to get the ICD 10 diagnosis code and NPI for the prescriber and the quantity is 100 and the description is free style light test strip. [facility] in [place]. Thank You!

## 2015-02-06 ENCOUNTER — Other Ambulatory Visit: Payer: Self-pay | Admitting: *Deleted

## 2015-02-06 MED ORDER — GLUCOSE BLOOD VI STRP: ORAL_STRIP | Status: DC

## 2015-02-08 ENCOUNTER — Other Ambulatory Visit: Payer: Self-pay | Admitting: Internal Medicine

## 2015-02-27 ENCOUNTER — Ambulatory Visit (INDEPENDENT_AMBULATORY_CARE_PROVIDER_SITE_OTHER): Payer: Medicare Other | Admitting: Internal Medicine

## 2015-02-27 ENCOUNTER — Encounter: Payer: Self-pay | Admitting: Internal Medicine

## 2015-02-27 VITALS — BP 114/90 | HR 71 | Temp 97.7°F | Ht 72.0 in | Wt 260.1 lb

## 2015-02-27 DIAGNOSIS — M549 Dorsalgia, unspecified: Secondary | ICD-10-CM | POA: Diagnosis not present

## 2015-02-27 DIAGNOSIS — IMO0002 Reserved for concepts with insufficient information to code with codable children: Secondary | ICD-10-CM

## 2015-02-27 DIAGNOSIS — E1142 Type 2 diabetes mellitus with diabetic polyneuropathy: Secondary | ICD-10-CM | POA: Diagnosis not present

## 2015-02-27 DIAGNOSIS — E1165 Type 2 diabetes mellitus with hyperglycemia: Secondary | ICD-10-CM | POA: Diagnosis not present

## 2015-02-27 DIAGNOSIS — G8929 Other chronic pain: Secondary | ICD-10-CM

## 2015-02-27 DIAGNOSIS — D696 Thrombocytopenia, unspecified: Secondary | ICD-10-CM | POA: Diagnosis not present

## 2015-02-27 DIAGNOSIS — E1122 Type 2 diabetes mellitus with diabetic chronic kidney disease: Secondary | ICD-10-CM

## 2015-02-27 DIAGNOSIS — N183 Chronic kidney disease, stage 3 unspecified: Secondary | ICD-10-CM

## 2015-02-27 DIAGNOSIS — Z23 Encounter for immunization: Secondary | ICD-10-CM | POA: Diagnosis not present

## 2015-02-27 DIAGNOSIS — E669 Obesity, unspecified: Secondary | ICD-10-CM

## 2015-02-27 DIAGNOSIS — E291 Testicular hypofunction: Secondary | ICD-10-CM

## 2015-02-27 DIAGNOSIS — R7989 Other specified abnormal findings of blood chemistry: Secondary | ICD-10-CM | POA: Insufficient documentation

## 2015-02-27 LAB — COMPREHENSIVE METABOLIC PANEL
ALT: 23 U/L (ref 0–53)
AST: 24 U/L (ref 0–37)
Albumin: 3.9 g/dL (ref 3.5–5.2)
Alkaline Phosphatase: 74 U/L (ref 39–117)
BUN: 12 mg/dL (ref 6–23)
CO2: 31 mEq/L (ref 19–32)
Calcium: 9.3 mg/dL (ref 8.4–10.5)
Chloride: 99 mEq/L (ref 96–112)
Creatinine, Ser: 1.27 mg/dL (ref 0.40–1.50)
GFR: 59.63 mL/min — ABNORMAL LOW (ref >60.00)
Glucose, Bld: 172 mg/dL — ABNORMAL HIGH (ref 70–99)
Potassium: 3.5 mEq/L (ref 3.5–5.1)
Sodium: 139 mEq/L (ref 135–145)
Total Bilirubin: 0.9 mg/dL (ref 0.2–1.2)
Total Protein: 6.5 g/dL (ref 6.0–8.3)

## 2015-02-27 LAB — CBC WITH DIFFERENTIAL/PLATELET
Basophils Absolute: 0 10*3/uL (ref 0.0–0.1)
Basophils Relative: 0.3 % (ref 0.0–3.0)
Eosinophils Absolute: 0.3 10*3/uL (ref 0.0–0.7)
Eosinophils Relative: 3.8 % (ref 0.0–5.0)
HCT: 39.3 % (ref 39.0–52.0)
Hemoglobin: 12.7 g/dL — ABNORMAL LOW (ref 13.0–17.0)
Lymphocytes Relative: 19.6 % (ref 12.0–46.0)
Lymphs Abs: 1.3 10*3/uL (ref 0.7–4.0)
MCHC: 32.4 g/dL (ref 30.0–36.0)
MCV: 89.5 fl (ref 78.0–100.0)
Monocytes Absolute: 0.4 10*3/uL (ref 0.1–1.0)
Monocytes Relative: 6.1 % (ref 3.0–12.0)
Neutro Abs: 4.6 10*3/uL (ref 1.4–7.7)
Neutrophils Relative %: 70.2 % (ref 43.0–77.0)
Platelets: 181 10*3/uL (ref 150.0–400.0)
RBC: 4.39 Mil/uL (ref 4.22–5.81)
RDW: 15.2 % (ref 11.5–15.5)
WBC: 6.6 10*3/uL (ref 4.0–10.5)

## 2015-02-27 LAB — HEMOGLOBIN A1C: Hgb A1c MFr Bld: 7.7 % — ABNORMAL HIGH (ref 4.6–6.5)

## 2015-02-27 LAB — MICROALBUMIN / CREATININE URINE RATIO
Creatinine,U: 47.7 mg/dL
Microalb Creat Ratio: 1.5 mg/g (ref 0.0–30.0)
Microalb, Ur: <0.7 mg/dL (ref 0.0–1.9)

## 2015-02-27 NOTE — Assessment & Plan Note (Signed)
Will check CBC with labs today.

## 2015-02-27 NOTE — Assessment & Plan Note (Signed)
On chronic testosterone through Dr. Yves Dill in Urology. Recent level at goal. Recent DRE and PSA normal.

## 2015-02-27 NOTE — Progress Notes (Signed)
Pre visit review using our clinic review tool, if applicable. No additional management support is needed unless otherwise documented below in the visit note. 

## 2015-02-27 NOTE — Patient Instructions (Signed)
Labs today.  Follow up in 3 months or sooner as needed. 

## 2015-02-27 NOTE — Assessment & Plan Note (Signed)
Wt Readings from Last 3 Encounters:  02/27/15 260 lb 2 oz (117.992 kg)  11/24/14 257 lb 4 oz (116.688 kg)  09/25/14 261 lb 6.4 oz (118.57 kg)   Encouraged healthy diet and exercise as tolerated. Note that exercise limited by neuropathy.

## 2015-02-27 NOTE — Assessment & Plan Note (Signed)
Symptoms of peripheral neuropathy relatively stable. Continue Lyrica, Cymbalta, and prn Hydrocodone for severe pain. Reviewed notes from Neurology. Started recently on Metanx. Follow up scheduled in 03/2015.

## 2015-02-27 NOTE — Assessment & Plan Note (Signed)
BG mostly near 150. Will check A1c with labs. Encouraged him to limit intake of sugars. Encouraged him to get an eye exam.

## 2015-02-27 NOTE — Progress Notes (Signed)
Subjective:    Patient ID: Douglas [name], male    DOB: [birth year], 69 y.o.   MRN: [id number]  HPI  [name] presents for follow up.  DM - Most recorded BG over last 3 months near 150. Limiting intake of sugar as much as possible, however notes some intake of bran flakes in the mornings, in an effort to help with constipation.  Neuropathy - Having relatively constant nerve pain in legs. Feels that Lyrica helps. Has taken Hydrocodone once since last visit for severe pain. Also started on B vitamin by neurology. Also on Cymbalta, which has been helpful.  Also recently seen by Urology. Had normal PSA and bladder emptying test which was normal. Testosterone level at goal.    Wt Readings from Last 3 Encounters:  02/27/15 260 lb 2 oz (117.992 kg)  11/24/14 257 lb 4 oz (116.688 kg)  09/25/14 261 lb 6.4 oz (118.57 kg)   BP Readings from Last 3 Encounters:  02/27/15 114/90  11/24/14 124/72  09/25/14 157/94    Past Medical History  Diagnosis Date  . Neuropathy (HCC)     diabetes  . Hypertension   . Diabetes mellitus without complication ([place])   . Gout   . Depression   . OSA (obstructive sleep apnea)     supplemental oxygen at night  . Syncope and collapse   . Kidney stones     Dr. Rogers Blocker  . S/P cardiac cath 1998    [facility]   Family History  Problem Relation Age of Onset  . Breast cancer Mother   . Lung cancer Mother   . Bone cancer Mother   . Heart Problems Mother   . Cancer Mother     breast, lung and rib  . AAA (abdominal aortic aneurysm) Mother   . Heart disease Mother   . Heart attack Father   . Heart disease Father   . Heart attack Brother   . Cancer Brother     esophageal   Past Surgical History  Procedure Laterality Date  . Knee surgery      arthroscopy  . Back surgery      rupture disc lumbar spine  . Shoulder surgery Bilateral     arthroscopic right, rotator cuff repair left   Social History   Social History  . Marital Status: Married   Spouse Name: [name]  . Number of Children: 3  . Years of Education: 13   Occupational History  .      retired   Social History Main Topics  . Smoking status: Never Smoker   . Smokeless tobacco: Never Used  . Alcohol Use: No  . Drug Use: No  . Sexual Activity: Not Asked   Other Topics Concern  . None   Social History Narrative   Lives in [place] with his wife ([name]). No children. Three step children. No pets.      Work - Patient is retired. Maintenance supervisor.      School - One year college education.      Right handed.      [place], served in [place], no combat                Review of Systems  Constitutional: Negative for fever, chills, activity change, appetite change, fatigue and unexpected weight change.  Eyes: Negative for visual disturbance.  Respiratory: Negative for cough, shortness of breath and wheezing.   Cardiovascular: Negative for chest pain, palpitations and leg swelling.  Gastrointestinal:  Negative for nausea, vomiting, abdominal pain, diarrhea, constipation and abdominal distention.  Genitourinary: Negative for dysuria, urgency and difficulty urinating.  Musculoskeletal: Positive for myalgias and arthralgias. Negative for gait problem.  Skin: Negative for color change and rash.  Neurological: Positive for dizziness (intermittent, longstanding), light-headedness and numbness. Negative for syncope and weakness.  Hematological: Negative for adenopathy.  Psychiatric/Behavioral: Negative for sleep disturbance and dysphoric mood. The patient is not nervous/anxious.        Objective:    BP 114/90 mmHg  Pulse 71  Temp(Src) 97.7 F (36.5 C) (Oral)  Ht 6' (1.829 m)  Wt 260 lb 2 oz (117.992 kg)  BMI 35.27 kg/m2  SpO2 96% Physical Exam  Constitutional: He is oriented to person, place, and time. He appears well-developed and well-nourished. No distress.  HENT:  Head: Normocephalic and atraumatic.  Right Ear: External ear  normal.  Left Ear: External ear normal.  Nose: Nose normal.  Mouth/Throat: Oropharynx is clear and moist. No oropharyngeal exudate.  Eyes: Conjunctivae and EOM are normal. Pupils are equal, round, and reactive to light. Right eye exhibits no discharge. Left eye exhibits no discharge. No scleral icterus.  Neck: Normal range of motion. Neck supple. No tracheal deviation present. No thyromegaly present.  Cardiovascular: Normal rate, regular rhythm and normal heart sounds.  Exam reveals no gallop and no friction rub.   No murmur heard. Pulmonary/Chest: Effort normal and breath sounds normal. No accessory muscle usage. No tachypnea. No respiratory distress. He has no decreased breath sounds. He has no wheezes. He has no rhonchi. He has no rales. He exhibits no tenderness.  Musculoskeletal: Normal range of motion. He exhibits no edema.  Lymphadenopathy:    He has no cervical adenopathy.  Neurological: He is alert and oriented to person, place, and time. No cranial nerve deficit. Coordination normal.  Skin: Skin is warm and dry. No rash noted. He is not diaphoretic. No erythema. No pallor.  Psychiatric: He has a normal mood and affect. His behavior is normal. Judgment and thought content normal.          Assessment & Plan:   Problem List Items Addressed This Visit      Unprioritized   Chronic back pain greater than 3 months duration   Diabetic peripheral neuropathy (HCC)    Symptoms of peripheral neuropathy relatively stable. Continue Lyrica, Cymbalta, and prn Hydrocodone for severe pain. Reviewed notes from Neurology. Started recently on Metanx. Follow up scheduled in 03/2015.      DM (diabetes mellitus), type 2, uncontrolled, with renal complications ([place]) - Primary    BG mostly near 150. Will check A1c with labs. Encouraged him to limit intake of sugars. Encouraged him to get an eye exam.      Relevant Orders   Comprehensive metabolic panel   Hemoglobin A1c   Microalbumin /  creatinine urine ratio   Low testosterone    On chronic testosterone through Dr. Yves Dill in Urology. Recent level at goal. Recent DRE and PSA normal.      Obesity (BMI 30-39.9)    Wt Readings from Last 3 Encounters:  02/27/15 260 lb 2 oz (117.992 kg)  11/24/14 257 lb 4 oz (116.688 kg)  09/25/14 261 lb 6.4 oz (118.57 kg)   Encouraged healthy diet and exercise as tolerated. Note that exercise limited by neuropathy.      Thrombocytopenia ([place])    Will check CBC with labs today.      Relevant Orders   CBC w/Diff  Return in about 3 months (around 05/30/2015) for Recheck of Diabetes.

## 2015-03-01 ENCOUNTER — Encounter: Payer: Self-pay | Admitting: *Deleted

## 2015-03-02 ENCOUNTER — Other Ambulatory Visit: Payer: Self-pay | Admitting: Internal Medicine

## 2015-03-07 ENCOUNTER — Other Ambulatory Visit: Payer: Self-pay | Admitting: Nurse Practitioner

## 2015-03-27 ENCOUNTER — Ambulatory Visit (INDEPENDENT_AMBULATORY_CARE_PROVIDER_SITE_OTHER): Payer: Medicare Other | Admitting: Nurse Practitioner

## 2015-03-27 ENCOUNTER — Encounter: Payer: Self-pay | Admitting: Nurse Practitioner

## 2015-03-27 VITALS — BP 154/95 | HR 93 | Ht 71.5 in | Wt 259.2 lb

## 2015-03-27 DIAGNOSIS — E1142 Type 2 diabetes mellitus with diabetic polyneuropathy: Secondary | ICD-10-CM | POA: Diagnosis not present

## 2015-03-27 DIAGNOSIS — G4733 Obstructive sleep apnea (adult) (pediatric): Secondary | ICD-10-CM | POA: Diagnosis not present

## 2015-03-27 MED ORDER — PREGABALIN 100 MG PO CAPS: 100.0000 mg | ORAL_CAPSULE | Freq: Three times a day (TID) | ORAL | Status: DC

## 2015-03-27 NOTE — Progress Notes (Signed)
I have reviewed and agreed above plan. 

## 2015-03-27 NOTE — Progress Notes (Signed)
[facility]  PATIENT: Douglas [name] DOB: 1946/03/08   REASON FOR VISIT:follow-up for diabetic neuropathy, obesity,  Obstructive sleep apnea HISTORY FROM:patient    HISTORY OF PRESENT ILLNESS:Douglas [name], 69 year old male returns for follow-up. He was last seen in the office 09/25/14. In addition to his feet and hand paresthesias he also has gout,  obstructive sleep apnea but could not tolerate CPAP and he is obese. He uses  O2 continuously at 2 L at night. He is currently on Cymbalta by his primary care Lyrica by this office. In addition he was started on Metanx  He has now been on the drug continuously for 6 months and feels he has experienced some benefit.  He gets very little exercise. His most recent hemoglobin A1c was 7.7. He denies any recent falls, he does not use an assistive device. He has not had a recent flare of gout. He returns for reevaluation  HISTORY: Douglas [name] 69 year old male returns for followup. He has a history of bilateral feet and hand paresthesias. He also has a history of diabetes since 2000, follow-up for diabetic peripheral neuropathy He has gout, obesity, obstructive sleep apnea but could not tolerate CPA, and diabetes for since 2000. He has no distal leg weakness, history of low back pain and occasional right lower extremity shooting pain. EMG nerve conduction showed evidence of length dependent mild axonal peripheral neuropathy. There is no evidence of right lumbosacral radiculopathy. Patient has had side effects to gabapentin in the past which cause swelling and rash. He did not find the cream to be beneficial.Laboratory evaluation in October 2013 showed normal TSH, B12, ESR, C-reactive protein, protein electrophoresis. He was taking Lyrica 101 3 times a day, but complains of unbearable bilateral feet and fingertips paresthesia, I have added on Trileptal 150 mg twice a day since his previous visit in June 2015, he did not notice any  significant benefit, He continued to be active, driving his grandchildren each day, also complains of chronic low back pain, previously had epidural injection, has been very helpful, Most recent A1c was 6.0.    REVIEW OF SYSTEMS: Full 14 system review of systems performed and notable only for those listed, all others are neg:  Constitutional: neg  Cardiovascular: neg Ear/Nose/Throat: neg  Skin: neg Eyes: neg Respiratory: neg Gastroitestinal: neg  Hematology/Lymphatic: neg  Endocrine: neg Musculoskeletal: aching muscles , joint pain walking difficulty and  Neck pain Allergy/Immunology: neg Neurological: neg Psychiatric:  Depression and anxiety history of panic  Attack Sleep :  Obstructive sleep apnea , uses O2 at 2 L at night   ALLERGIES: Allergies  Allergen Reactions  . Atenolol   . Hctz [Hydrochlorothiazide]   . Bee Venom Swelling  . Enalapril     itching  . Gabapentin     rash    HOME MEDICATIONS: Outpatient Prescriptions Prior to Visit  Medication Sig Dispense Refill  . allopurinol (ZYLOPRIM) 300 MG tablet TAKE 1 TABLET BY MOUTH ONCE A DAY (Patient taking differently: TAKE 1 TABLET BY MOUTH ONCE A DAY ( taking 1/2 tablet pm)) 90 tablet 3  . ALPRAZolam (XANAX) 1 MG tablet TAKE 1/2 TO 1 TABLET BY MOUTH 3 TIMES A DAY AS NEEDED 90 tablet 3  . aspirin 81 MG tablet Take 81 mg by mouth daily.    . Blood Glucose Monitoring Suppl (FREESTYLE FREEDOM LITE) W/DEVICE KIT Use as directed 1 each 0  . cetirizine (ZYRTEC) 10 MG tablet Take 10 mg by mouth daily.    Marland Kitchen  colchicine 0.6 MG tablet Take 0.6 mg by mouth as needed. Take 2 tablets then an hour later take 2 tablets then next hour take 1 tablet as needed for gout flare.    . doxazosin (CARDURA) 8 MG tablet TAKE 1 TABLET BY MOUTH AT BEDTIME 30 tablet 5  . DULoxetine (CYMBALTA) 60 MG capsule TAKE 1 CAPSULE BY MOUTH DAILY 30 capsule 2  . esomeprazole (NEXIUM) 40 MG capsule Take 1 capsule (40 mg total) by mouth daily at 12 noon. 90  capsule 3  . furosemide (LASIX) 40 MG tablet TAKE 1 TABLET BY MOUTH ONCE A DAY 90 tablet 3  . glucose blood (FREESTYLE LITE) test strip Use as directed to check blood sugar twice daily.  Dx: E11.29 200 each 3  . HYDROcodone-acetaminophen (NORCO/VICODIN) 5-325 MG per tablet Take 1 tablet by mouth 3 (three) times daily as needed. 60 tablet 0  . levothyroxine (SYNTHROID, LEVOTHROID) 100 MCG tablet TAKE 1 TABLET BY MOUTH EVERY DAY EXCEPT MONDAY AND WEDNESDAY. TAKE 2 TABLETS ON MON. AND WED 90 tablet 2  . Magnesium 500 MG TABS Take by mouth.    . multivitamin (METANX) 3-35-2 MG TABS tablet TAKE ONE TABLET BY MOUTH TWO TIMES DAILY 180 tablet 0  . pregabalin (LYRICA) 100 MG capsule Take 1 capsule (100 mg total) by mouth 3 (three) times daily. 90 capsule 5  . simvastatin (ZOCOR) 40 MG tablet TAKE 1 TABLET BY MOUTH AT BEDTIME 30 tablet 3  . Testosterone Propionate 2 % CREA Place 1 mL onto the skin daily.    Marland Kitchen triamcinolone cream (KENALOG) 0.1 % Use prn.    . trimethoprim-polymyxin b (POLYTRIM) ophthalmic solution Place 1 drop into both eyes every 4 (four) hours. 10 mL 0  . vitamin B-12 (CYANOCOBALAMIN) 1000 MCG tablet Take 500 mcg by mouth daily.     . Vitamins A & D (VITAMIN A & D) 8000-400 UNITS CAPS Take by mouth.     No facility-administered medications prior to visit.    PAST MEDICAL HISTORY: Past Medical History  Diagnosis Date  . Neuropathy (HCC)     diabetes  . Hypertension   . Diabetes mellitus without complication ([place])   . Gout   . Depression   . OSA (obstructive sleep apnea)     supplemental oxygen at night  . Syncope and collapse   . Kidney stones     Dr. Rogers Blocker  . S/P cardiac cath 1998    [facility]    PAST SURGICAL HISTORY: Past Surgical History  Procedure Laterality Date  . Knee surgery      arthroscopy  . Back surgery      rupture disc lumbar spine  . Shoulder surgery Bilateral     arthroscopic right, rotator cuff repair left    FAMILY HISTORY: Family History    Problem Relation Age of Onset  . Breast cancer Mother   . Lung cancer Mother   . Bone cancer Mother   . Heart Problems Mother   . Cancer Mother     breast, lung and rib  . AAA (abdominal aortic aneurysm) Mother   . Heart disease Mother   . Heart attack Father   . Heart disease Father   . Heart attack Brother   . Cancer Brother     esophageal    SOCIAL HISTORY: Social History   Social History  . Marital Status: Married    Spouse Name: [name]  . Number of Children: 3  . Years of Education: 41  Occupational History  .      retired   Social History Main Topics  . Smoking status: Never Smoker   . Smokeless tobacco: Never Used  . Alcohol Use: No  . Drug Use: No  . Sexual Activity: Not on file   Other Topics Concern  . Not on file   Social History Narrative   Lives in [place] with his wife ([name]). No children. Three step children. No pets.      Work - Patient is retired. Maintenance supervisor.      School - One year college education.      Right handed.      [place], served in [place], no combat                 PHYSICAL EXAM  Filed Vitals:   03/27/15 1340  BP: 154/95  Pulse: 93  Height: 5' 11.5" (1.816 m)  Weight: 259 lb 3.2 oz (117.572 kg)   Body mass index is 35.65 kg/(m^2). Generalized: Well developed, obese male in no acute distress  Head: normocephalic and atraumatic,. Oropharynx benign  Neck: Supple, no carotid bruits  Cardiac: Regular rate rhythm, no murmur    Neurological examination   Mentation: Alert oriented to time, place, history taking. Follows all commands speech and language fluent  Cranial nerve II-XII: Pupils were equal round reactive to light extraocular movements were full, visual field were full on confrontational test. Facial sensation and strength were normal. hearing was intact to finger rubbing bilaterally. Uvula tongue midline. head turning and shoulder shrug and were normal and  symmetric.Tongue protrusion into cheek strength was normal. Motor: normal bulk and tone, full strength in the BUE, BLE, fine finger movements normal, no pronator drift. No focal weakness Sensory: Length dependent decreased light touch decreased pinprick to mid shin bilaterally decreased vibratory to ankles proprioception normal  Coordination: finger-nose-finger, heel-to-shin bilaterally, no dysmetria Reflexes: Brachioradialis 2/2, biceps 2/2, triceps 2/2, patellar 2/2, Achilles absent, plantar responses were flexor bilaterally. Gait and Station: Rising up from seated position without assistance, normal stance, moderate stride, good arm swing, smooth turning, moderate difficulty to perform tiptoe, and heel walking. Unsteady with tandem walk . No assistive device  DIAGNOSTIC DATA (LABS, IMAGING, TESTING) - I reviewed patient records, labs, notes, testing and imaging myself where available.  Lab Results  Component Value Date   WBC 6.6 02/27/2015   HGB 12.7* 02/27/2015   HCT 39.3 02/27/2015   MCV 89.5 02/27/2015   PLT 181.0 02/27/2015      Component Value Date/Time   NA 139 02/27/2015 0925   NA 142 12/04/2012 0414   K 3.5 02/27/2015 0925   K 4.4 12/04/2012 0414   CL 99 02/27/2015 0925   CL 110* 12/04/2012 0414   CO2 31 02/27/2015 0925   CO2 26 12/04/2012 0414   GLUCOSE 172* 02/27/2015 0925   GLUCOSE 116* 12/04/2012 0414   BUN 12 02/27/2015 0925   BUN 32* 12/04/2012 0414   CREATININE 1.27 02/27/2015 0925   CREATININE 2.33* 12/04/2012 0414   CALCIUM 9.3 02/27/2015 0925   CALCIUM 9.1 12/04/2012 0414   PROT 6.5 02/27/2015 0925   PROT 6.8 12/02/2012 2139   ALBUMIN 3.9 02/27/2015 0925   ALBUMIN 3.7 12/02/2012 2139   AST 24 02/27/2015 0925   AST 81* 12/02/2012 2139   ALT 23 02/27/2015 0925   ALT 74 12/02/2012 2139   ALKPHOS 74 02/27/2015 0925   ALKPHOS 64 12/02/2012 2139   BILITOT 0.9 02/27/2015 5859  BILITOT 0.5 12/02/2012 2139   GFRNONAA 28* 12/04/2012 0414   GFRAA 32*  12/04/2012 0414   Lab Results  Component Value Date   CHOL 136 11/24/2014   HDL 41.30 11/24/2014   LDLCALC 69 11/24/2014   TRIG 132.0 11/24/2014   CHOLHDL 3 11/24/2014   Lab Results  Component Value Date   HGBA1C 7.7* 02/27/2015    Lab Results  Component Value Date   TSH 1.71 11/24/2014      ASSESSMENT AND PLAN  69 y.o. year old male  has a past medical history of Neuropathy ([place]); Hypertension; Diabetes mellitus without complication ([place]); Gout; Depression; OSA (obstructive sleep apnea);  here  To follow up.  Continue Cymbalta at current dose Continue Lyrica at current dose will refill RX to patient Continue Metanx at current dose Follow-up in 6 months Next visit with Dr. Luan Pulling, [facility], [facility], [facility] [address] [place], [place] [address] [phone]

## 2015-03-27 NOTE — Patient Instructions (Addendum)
Continue Cymbalta at current dose Continue Lyrica at current dose will refill Continue Metanx at current dose Follow-up in 6 months Next visit with Dr. Krista Blue

## 2015-04-02 DIAGNOSIS — L820 Inflamed seborrheic keratosis: Secondary | ICD-10-CM | POA: Diagnosis not present

## 2015-04-02 DIAGNOSIS — L578 Other skin changes due to chronic exposure to nonionizing radiation: Secondary | ICD-10-CM | POA: Diagnosis not present

## 2015-04-02 DIAGNOSIS — L720 Epidermal cyst: Secondary | ICD-10-CM | POA: Diagnosis not present

## 2015-04-02 DIAGNOSIS — D229 Melanocytic nevi, unspecified: Secondary | ICD-10-CM | POA: Diagnosis not present

## 2015-04-02 DIAGNOSIS — L918 Other hypertrophic disorders of the skin: Secondary | ICD-10-CM | POA: Diagnosis not present

## 2015-04-02 DIAGNOSIS — L309 Dermatitis, unspecified: Secondary | ICD-10-CM | POA: Diagnosis not present

## 2015-04-02 DIAGNOSIS — L570 Actinic keratosis: Secondary | ICD-10-CM | POA: Diagnosis not present

## 2015-04-02 DIAGNOSIS — Z1283 Encounter for screening for malignant neoplasm of skin: Secondary | ICD-10-CM | POA: Diagnosis not present

## 2015-04-02 DIAGNOSIS — L821 Other seborrheic keratosis: Secondary | ICD-10-CM | POA: Diagnosis not present

## 2015-04-02 DIAGNOSIS — L812 Freckles: Secondary | ICD-10-CM | POA: Diagnosis not present

## 2015-04-02 DIAGNOSIS — D1800 Hemangioma unspecified site: Secondary | ICD-10-CM | POA: Diagnosis not present

## 2015-04-17 ENCOUNTER — Other Ambulatory Visit: Payer: Self-pay | Admitting: Nurse Practitioner

## 2015-04-18 ENCOUNTER — Other Ambulatory Visit: Payer: Self-pay

## 2015-04-18 MED ORDER — PREGABALIN 100 MG PO CAPS: 100.0000 mg | ORAL_CAPSULE | Freq: Three times a day (TID) | ORAL | Status: DC

## 2015-04-18 NOTE — Telephone Encounter (Signed)
Patient has changed pharmacies with the new year.  Thank you!

## 2015-04-22 ENCOUNTER — Other Ambulatory Visit: Payer: Self-pay | Admitting: Internal Medicine

## 2015-04-24 DIAGNOSIS — D485 Neoplasm of uncertain behavior of skin: Secondary | ICD-10-CM | POA: Diagnosis not present

## 2015-04-24 DIAGNOSIS — D224 Melanocytic nevi of scalp and neck: Secondary | ICD-10-CM | POA: Diagnosis not present

## 2015-04-28 ENCOUNTER — Other Ambulatory Visit: Payer: Self-pay | Admitting: Internal Medicine

## 2015-05-01 DIAGNOSIS — L720 Epidermal cyst: Secondary | ICD-10-CM | POA: Diagnosis not present

## 2015-05-25 ENCOUNTER — Telehealth: Payer: Self-pay

## 2015-05-25 MED ORDER — DULOXETINE HCL 60 MG PO CPEP: 60.0000 mg | ORAL_CAPSULE | Freq: Every day | ORAL | Status: DC

## 2015-05-25 NOTE — Telephone Encounter (Signed)
Pt called and stated that he needed to switch his rx to [facility] from [facility] due to insurance, the request was made

## 2015-05-31 ENCOUNTER — Ambulatory Visit (INDEPENDENT_AMBULATORY_CARE_PROVIDER_SITE_OTHER): Payer: Medicare Other | Admitting: Internal Medicine

## 2015-05-31 ENCOUNTER — Encounter: Payer: Self-pay | Admitting: Internal Medicine

## 2015-05-31 VITALS — BP 108/76 | HR 74 | Temp 98.3°F | Ht 72.0 in | Wt 261.2 lb

## 2015-05-31 DIAGNOSIS — N183 Chronic kidney disease, stage 3 unspecified: Secondary | ICD-10-CM

## 2015-05-31 DIAGNOSIS — I10 Essential (primary) hypertension: Secondary | ICD-10-CM

## 2015-05-31 DIAGNOSIS — E1165 Type 2 diabetes mellitus with hyperglycemia: Secondary | ICD-10-CM

## 2015-05-31 DIAGNOSIS — E1122 Type 2 diabetes mellitus with diabetic chronic kidney disease: Secondary | ICD-10-CM | POA: Diagnosis not present

## 2015-05-31 DIAGNOSIS — IMO0002 Reserved for concepts with insufficient information to code with codable children: Secondary | ICD-10-CM

## 2015-05-31 DIAGNOSIS — D696 Thrombocytopenia, unspecified: Secondary | ICD-10-CM | POA: Diagnosis not present

## 2015-05-31 DIAGNOSIS — E1142 Type 2 diabetes mellitus with diabetic polyneuropathy: Secondary | ICD-10-CM

## 2015-05-31 LAB — CBC WITH DIFFERENTIAL/PLATELET
Basophils Absolute: 0 10*3/uL (ref 0.0–0.1)
Basophils Relative: 0.4 % (ref 0.0–3.0)
Eosinophils Absolute: 0.2 10*3/uL (ref 0.0–0.7)
Eosinophils Relative: 2.5 % (ref 0.0–5.0)
HCT: 41.1 % (ref 39.0–52.0)
Hemoglobin: 13.3 g/dL (ref 13.0–17.0)
Lymphocytes Relative: 24.9 % (ref 12.0–46.0)
Lymphs Abs: 1.9 10*3/uL (ref 0.7–4.0)
MCHC: 32.2 g/dL (ref 30.0–36.0)
MCV: 87.5 fl (ref 78.0–100.0)
Monocytes Absolute: 0.5 10*3/uL (ref 0.1–1.0)
Monocytes Relative: 7.1 % (ref 3.0–12.0)
Neutro Abs: 4.8 10*3/uL (ref 1.4–7.7)
Neutrophils Relative %: 65.1 % (ref 43.0–77.0)
Platelets: 98 10*3/uL — ABNORMAL LOW (ref 150.0–400.0)
RBC: 4.7 Mil/uL (ref 4.22–5.81)
RDW: 16.4 % — ABNORMAL HIGH (ref 11.5–15.5)
WBC: 7.4 10*3/uL (ref 4.0–10.5)

## 2015-05-31 LAB — LIPID PANEL
Cholesterol: 133 mg/dL (ref 0–200)
HDL: 44.1 mg/dL (ref >39.00)
LDL Cholesterol: 58 mg/dL (ref 0–99)
NonHDL: 88.93
Total CHOL/HDL Ratio: 3
Triglycerides: 157 mg/dL — ABNORMAL HIGH (ref 0.0–149.0)
VLDL: 31.4 mg/dL (ref 0.0–40.0)

## 2015-05-31 LAB — COMPREHENSIVE METABOLIC PANEL
ALT: 16 U/L (ref 0–53)
AST: 18 U/L (ref 0–37)
Albumin: 4.4 g/dL (ref 3.5–5.2)
Alkaline Phosphatase: 81 U/L (ref 39–117)
BUN: 12 mg/dL (ref 6–23)
CO2: 33 mEq/L — ABNORMAL HIGH (ref 19–32)
Calcium: 9.5 mg/dL (ref 8.4–10.5)
Chloride: 99 mEq/L (ref 96–112)
Creatinine, Ser: 1.4 mg/dL (ref 0.40–1.50)
GFR: 53.25 mL/min — ABNORMAL LOW (ref >60.00)
Glucose, Bld: 106 mg/dL — ABNORMAL HIGH (ref 70–99)
Potassium: 3.9 mEq/L (ref 3.5–5.1)
Sodium: 140 mEq/L (ref 135–145)
Total Bilirubin: 1 mg/dL (ref 0.2–1.2)
Total Protein: 7 g/dL (ref 6.0–8.3)

## 2015-05-31 LAB — MICROALBUMIN / CREATININE URINE RATIO
Creatinine,U: 108.6 mg/dL
Microalb Creat Ratio: 1.2 mg/g (ref 0.0–30.0)
Microalb, Ur: 1.3 mg/dL (ref 0.0–1.9)

## 2015-05-31 LAB — HEMOGLOBIN A1C: Hgb A1c MFr Bld: 6.5 % (ref 4.6–6.5)

## 2015-05-31 MED ORDER — COLCHICINE 0.6 MG PO TABS: 0.6000 mg | ORAL_TABLET | Freq: Two times a day (BID) | ORAL | Status: DC

## 2015-05-31 MED ORDER — SIMVASTATIN 40 MG PO TABS: 40.0000 mg | ORAL_TABLET | Freq: Every day | ORAL | Status: DC

## 2015-05-31 MED ORDER — HYDROCODONE-ACETAMINOPHEN 5-325 MG PO TABS: 1.0000 | ORAL_TABLET | Freq: Three times a day (TID) | ORAL | Status: DC | PRN

## 2015-05-31 NOTE — Assessment & Plan Note (Signed)
BP Readings from Last 3 Encounters:  05/31/15 108/76  03/27/15 154/95  02/27/15 114/90   BP well controlled. Renal function with labs.

## 2015-05-31 NOTE — Patient Instructions (Signed)
Labs today.   Follow up in 3 months.  

## 2015-05-31 NOTE — Assessment & Plan Note (Signed)
Worsening symptoms after stopping Lyrica, because of cost. He is back on medication now and symptoms improving. Will follow. Use Hydrocodone only for rare severe pain.

## 2015-05-31 NOTE — Progress Notes (Signed)
Subjective:    Patient ID: Douglas [name], male    DOB: 1946/01/09, 70 y.o.   MRN: [id number]  HPI  70YO male presents for follow up.  DM - BG running 80s-150 mostly on Glimepiride. He notes that bread and pasta spike BG.  Neuropathy - Feels that symptoms getting worse. Was off Lyrica for several weeks because of cost. Uses Hydrocodone occasionally for severe pain. Starting back on Lyrica. Looking into getting medication from [place].  Wt Readings from Last 3 Encounters:  05/31/15 261 lb 4 oz (118.502 kg)  03/27/15 259 lb 3.2 oz (117.572 kg)  02/27/15 260 lb 2 oz (117.992 kg)   BP Readings from Last 3 Encounters:  05/31/15 108/76  03/27/15 154/95  02/27/15 114/90    Past Medical History  Diagnosis Date  . Neuropathy (HCC)     diabetes  . Hypertension   . Diabetes mellitus without complication ([place])   . Gout   . Depression   . OSA (obstructive sleep apnea)     supplemental oxygen at night  . Syncope and collapse   . Kidney stones     Dr. Rogers Blocker  . S/P cardiac cath 1998    [facility]   Family History  Problem Relation Age of Onset  . Breast cancer Mother   . Lung cancer Mother   . Bone cancer Mother   . Heart Problems Mother   . Cancer Mother     breast, lung and rib  . AAA (abdominal aortic aneurysm) Mother   . Heart disease Mother   . Heart attack Father   . Heart disease Father   . Heart attack Brother   . Cancer Brother     esophageal   Past Surgical History  Procedure Laterality Date  . Knee surgery      arthroscopy  . Back surgery      rupture disc lumbar spine  . Shoulder surgery Bilateral     arthroscopic right, rotator cuff repair left   Social History   Social History  . Marital Status: Married    Spouse Name: [name]  . Number of Children: 3  . Years of Education: 13   Occupational History  .      retired   Social History Main Topics  . Smoking status: Never Smoker   . Smokeless tobacco: Never Used  . Alcohol Use: No  . Drug  Use: No  . Sexual Activity: Not Asked   Other Topics Concern  . None   Social History Narrative   Lives in [place] with his wife ([name]). No children. Three step children. No pets.      Work - Patient is retired. Maintenance supervisor.      School - One year college education.      Right handed.      [place], served in [place], no combat                Review of Systems  Constitutional: Negative for fever, chills, activity change, appetite change, fatigue and unexpected weight change.  Eyes: Negative for visual disturbance.  Respiratory: Negative for cough and shortness of breath.   Cardiovascular: Negative for chest pain, palpitations and leg swelling.  Gastrointestinal: Negative for abdominal pain and abdominal distention.  Genitourinary: Negative for dysuria, urgency and difficulty urinating.  Musculoskeletal: Positive for myalgias and arthralgias. Negative for gait problem.  Skin: Negative for color change and rash.  Hematological: Negative for adenopathy.  Psychiatric/Behavioral: Negative for suicidal ideas,  sleep disturbance and dysphoric mood. The patient is not nervous/anxious.        Objective:    BP 108/76 mmHg  Pulse 74  Temp(Src) 98.3 F (36.8 C) (Oral)  Ht 6' (1.829 m)  Wt 261 lb 4 oz (118.502 kg)  BMI 35.42 kg/m2  SpO2 98% Physical Exam  Constitutional: He is oriented to person, place, and time. He appears well-developed and well-nourished. No distress.  HENT:  Head: Normocephalic and atraumatic.  Right Ear: External ear normal.  Left Ear: External ear normal.  Nose: Nose normal.  Mouth/Throat: Oropharynx is clear and moist. No oropharyngeal exudate.  Eyes: Conjunctivae and EOM are normal. Pupils are equal, round, and reactive to light. Right eye exhibits no discharge. Left eye exhibits no discharge. No scleral icterus.  Neck: Normal range of motion. Neck supple. No tracheal deviation present. No thyromegaly present.    Cardiovascular: Normal rate, regular rhythm and normal heart sounds.  Exam reveals no gallop and no friction rub.   No murmur heard. Pulmonary/Chest: Effort normal and breath sounds normal. No accessory muscle usage. No tachypnea. No respiratory distress. He has no decreased breath sounds. He has no wheezes. He has no rhonchi. He has no rales. He exhibits no tenderness.  Musculoskeletal: Normal range of motion. He exhibits no edema or tenderness.  Lymphadenopathy:    He has no cervical adenopathy.  Neurological: He is alert and oriented to person, place, and time. No cranial nerve deficit. Coordination (unsteady gait) abnormal.  Skin: Skin is warm and dry. No rash noted. He is not diaphoretic. No erythema. No pallor.  Psychiatric: He has a normal mood and affect. His behavior is normal. Judgment and thought content normal.          Assessment & Plan:   Problem List Items Addressed This Visit      Unprioritized   Chronic kidney disease    Will check renal function with labs today.      Diabetic peripheral neuropathy (HCC)    Worsening symptoms after stopping Lyrica, because of cost. He is back on medication now and symptoms improving. Will follow. Use Hydrocodone only for rare severe pain.      Relevant Medications   glimepiride (AMARYL) 2 MG tablet   simvastatin (ZOCOR) 40 MG tablet   DM (diabetes mellitus), type 2, uncontrolled, with renal complications (HCC) - Primary    BG much improved on Glimepiride. Will continue. A1c with labs today.      Relevant Medications   glimepiride (AMARYL) 2 MG tablet   simvastatin (ZOCOR) 40 MG tablet   Other Relevant Orders   Comprehensive metabolic panel   Hemoglobin A1c   Lipid panel   Microalbumin / creatinine urine ratio   Essential hypertension    BP Readings from Last 3 Encounters:  05/31/15 108/76  03/27/15 154/95  02/27/15 114/90   BP well controlled. Renal function with labs.      Relevant Medications   simvastatin  (ZOCOR) 40 MG tablet   Thrombocytopenia (HCC)    Repeat CBC with labs today.      Relevant Orders   CBC with Differential/Platelet       Return in about 3 months (around 08/28/2015) for Recheck of Diabetes.

## 2015-05-31 NOTE — Progress Notes (Signed)
Pre visit review using our clinic review tool, if applicable. No additional management support is needed unless otherwise documented below in the visit note. 

## 2015-05-31 NOTE — Assessment & Plan Note (Signed)
Repeat CBC with labs today. 

## 2015-05-31 NOTE — Assessment & Plan Note (Signed)
BG much improved on Glimepiride. Will continue. A1c with labs today.

## 2015-05-31 NOTE — Assessment & Plan Note (Signed)
Will check renal function with labs today. 

## 2015-06-01 ENCOUNTER — Other Ambulatory Visit (INDEPENDENT_AMBULATORY_CARE_PROVIDER_SITE_OTHER): Payer: Medicare Other

## 2015-06-01 ENCOUNTER — Other Ambulatory Visit: Payer: Self-pay | Admitting: Internal Medicine

## 2015-06-01 DIAGNOSIS — D696 Thrombocytopenia, unspecified: Secondary | ICD-10-CM | POA: Diagnosis not present

## 2015-06-01 NOTE — Progress Notes (Signed)
Patient here today for lab draw only. 

## 2015-06-02 LAB — CBC
HCT: 38.6 % — ABNORMAL LOW (ref 39.0–52.0)
Hemoglobin: 12.6 g/dL — ABNORMAL LOW (ref 13.0–17.0)
MCH: 28.1 pg (ref 26.0–34.0)
MCHC: 32.6 g/dL (ref 30.0–36.0)
MCV: 86.2 fL (ref 78.0–100.0)
MPV: 11.8 fL (ref 8.6–12.4)
Platelets: 57 10*3/uL — ABNORMAL LOW (ref 150–400)
RBC: 4.48 MIL/uL (ref 4.22–5.81)
RDW: 15.6 % — ABNORMAL HIGH (ref 11.5–15.5)
WBC: 5.8 10*3/uL (ref 4.0–10.5)

## 2015-06-03 ENCOUNTER — Other Ambulatory Visit: Payer: Self-pay | Admitting: Internal Medicine

## 2015-06-03 DIAGNOSIS — D696 Thrombocytopenia, unspecified: Secondary | ICD-10-CM

## 2015-06-04 ENCOUNTER — Telehealth: Payer: Self-pay | Admitting: Internal Medicine

## 2015-06-04 NOTE — Telephone Encounter (Signed)
Douglas [name] spoke with pt

## 2015-06-04 NOTE — Telephone Encounter (Signed)
Pt called back returning your phone call about lab results.. Please advise pt

## 2015-06-07 ENCOUNTER — Inpatient Hospital Stay: Payer: Medicare Other

## 2015-06-07 ENCOUNTER — Other Ambulatory Visit: Payer: Self-pay | Admitting: Internal Medicine

## 2015-06-07 ENCOUNTER — Encounter: Payer: Self-pay | Admitting: Internal Medicine

## 2015-06-07 ENCOUNTER — Inpatient Hospital Stay: Payer: Medicare Other | Attending: Internal Medicine | Admitting: Internal Medicine

## 2015-06-07 VITALS — BP 156/85 | HR 81 | Temp 98.3°F | Resp 18 | Wt 260.7 lb

## 2015-06-07 DIAGNOSIS — G4733 Obstructive sleep apnea (adult) (pediatric): Secondary | ICD-10-CM | POA: Diagnosis not present

## 2015-06-07 DIAGNOSIS — F329 Major depressive disorder, single episode, unspecified: Secondary | ICD-10-CM | POA: Insufficient documentation

## 2015-06-07 DIAGNOSIS — D696 Thrombocytopenia, unspecified: Secondary | ICD-10-CM

## 2015-06-07 DIAGNOSIS — Z79899 Other long term (current) drug therapy: Secondary | ICD-10-CM | POA: Diagnosis not present

## 2015-06-07 DIAGNOSIS — M109 Gout, unspecified: Secondary | ICD-10-CM | POA: Insufficient documentation

## 2015-06-07 DIAGNOSIS — R161 Splenomegaly, not elsewhere classified: Secondary | ICD-10-CM

## 2015-06-07 DIAGNOSIS — D649 Anemia, unspecified: Secondary | ICD-10-CM | POA: Insufficient documentation

## 2015-06-07 DIAGNOSIS — Z7982 Long term (current) use of aspirin: Secondary | ICD-10-CM | POA: Diagnosis not present

## 2015-06-07 DIAGNOSIS — I10 Essential (primary) hypertension: Secondary | ICD-10-CM | POA: Diagnosis not present

## 2015-06-07 DIAGNOSIS — Z87442 Personal history of urinary calculi: Secondary | ICD-10-CM | POA: Diagnosis not present

## 2015-06-07 LAB — IRON AND TIBC
Iron: 37 ug/dL — ABNORMAL LOW (ref 45–182)
Saturation Ratios: 10 % — ABNORMAL LOW (ref 17.9–39.5)
TIBC: 368 ug/dL (ref 250–450)
UIBC: 331 ug/dL

## 2015-06-07 LAB — CBC WITH DIFFERENTIAL/PLATELET
Basophils Absolute: 0.1 10*3/uL (ref 0–0.1)
Basophils Relative: 1 %
Eosinophils Absolute: 0.2 10*3/uL (ref 0–0.7)
Eosinophils Relative: 2 %
HCT: 41.2 % (ref 40.0–52.0)
Hemoglobin: 13.6 g/dL (ref 13.0–18.0)
Lymphocytes Relative: 19 %
Lymphs Abs: 1.5 10*3/uL (ref 1.0–3.6)
MCH: 28.5 pg (ref 26.0–34.0)
MCHC: 33 g/dL (ref 32.0–36.0)
MCV: 86.3 fL (ref 80.0–100.0)
Monocytes Absolute: 0.6 10*3/uL (ref 0.2–1.0)
Monocytes Relative: 7 %
Neutro Abs: 5.6 10*3/uL (ref 1.4–6.5)
Neutrophils Relative %: 71 %
Platelets: 190 10*3/uL (ref 150–440)
RBC: 4.78 MIL/uL (ref 4.40–5.90)
RDW: 15.6 % — ABNORMAL HIGH (ref 11.5–14.5)
WBC: 8 10*3/uL (ref 3.8–10.6)

## 2015-06-07 LAB — COMPREHENSIVE METABOLIC PANEL
ALT: 19 U/L (ref 17–63)
AST: 23 U/L (ref 15–41)
Albumin: 4.3 g/dL (ref 3.5–5.0)
Alkaline Phosphatase: 73 U/L (ref 38–126)
Anion gap: 7 (ref 5–15)
BUN: 19 mg/dL (ref 6–20)
CO2: 30 mmol/L (ref 22–32)
Calcium: 9.5 mg/dL (ref 8.9–10.3)
Chloride: 101 mmol/L (ref 101–111)
Creatinine, Ser: 1.37 mg/dL — ABNORMAL HIGH (ref 0.61–1.24)
GFR calc Af Amer: 59 mL/min — ABNORMAL LOW (ref >60)
GFR calc non Af Amer: 51 mL/min — ABNORMAL LOW (ref >60)
Glucose, Bld: 131 mg/dL — ABNORMAL HIGH (ref 65–99)
Potassium: 3.5 mmol/L (ref 3.5–5.1)
Sodium: 138 mmol/L (ref 135–145)
Total Bilirubin: 0.7 mg/dL (ref 0.3–1.2)
Total Protein: 7.3 g/dL (ref 6.5–8.1)

## 2015-06-07 LAB — RETICULOCYTES
RBC.: 4.78 MIL/uL (ref 4.40–5.90)
Retic Count, Absolute: 66.9 10*3/uL (ref 19.0–183.0)
Retic Ct Pct: 1.4 % (ref 0.4–3.1)

## 2015-06-07 LAB — FERRITIN: Ferritin: 29 ng/mL (ref 24–336)

## 2015-06-07 LAB — LACTATE DEHYDROGENASE: LDH: 133 U/L (ref 98–192)

## 2015-06-07 NOTE — Progress Notes (Signed)
[place] NOTE  Patient Care Team: Jackolyn Confer, MD as PCP - General (Internal Medicine)  CHIEF COMPLAINTS/PURPOSE OF CONSULTATION:   # FEB 2017-  Thrombocytopenia [60s-90]  # Diabetic neuropathy  HISTORY OF PRESENTING ILLNESS:  Douglas [name] 70 y.o.  male  Referred to [place] for further evaluation of thrombocytopenia.  Patient  Recently had blood work this PCP that showed-  Platelets of 90;  And a follow-up blood work few days later showed  platelets 56.    Patient denies any easy bruising or bleeding.  Denies any recent viral infections.  Denies any new medications. Denies an alcohol.  Denies any  Unusual weight loss or night sweats or fevers.   patient has chronic mild swelling in the legs.  Patient has chronic fairly intense neuropathy from his diabetes.  ROS: A complete 10 point review of system is done which is negative except mentioned above in history of present illness  MEDICAL HISTORY:  Past Medical History  Diagnosis Date  . Neuropathy (HCC)     diabetes  . Hypertension   . Diabetes mellitus without complication ([place])   . Gout   . Depression   . OSA (obstructive sleep apnea)     supplemental oxygen at night  . Syncope and collapse   . Kidney stones     Dr. Rogers Blocker  . S/P cardiac cath 1998    [facility]    SURGICAL HISTORY: Past Surgical History  Procedure Laterality Date  . Knee surgery      arthroscopy  . Back surgery      rupture disc lumbar spine  . Shoulder surgery Bilateral     arthroscopic right, rotator cuff repair left    SOCIAL HISTORY: Social History   Social History  . Marital Status: Married    Spouse Name: [name]  . Number of Children: 3  . Years of Education: 13   Occupational History  .      retired   Social History Main Topics  . Smoking status: Never Smoker   . Smokeless tobacco: Never Used  . Alcohol Use: No  . Drug Use: No  . Sexual Activity: Not on file   Other Topics Concern  . Not on file    Social History Narrative   Lives in [place] with his wife ([name]). No children. Three step children. No pets.      Work - Patient is retired. Maintenance supervisor.      School - One year college education.      Right handed.      [place], served in [place], no combat                FAMILY HISTORY: Family History  Problem Relation Age of Onset  . Breast cancer Mother   . Lung cancer Mother   . Bone cancer Mother   . Heart Problems Mother   . Cancer Mother     breast, lung and rib  . AAA (abdominal aortic aneurysm) Mother   . Heart disease Mother   . Heart attack Father   . Heart disease Father   . Heart attack Brother   . Cancer Brother     esophageal    ALLERGIES:  is allergic to atenolol; hctz; bee venom; enalapril; and gabapentin.  MEDICATIONS:  Current Outpatient Prescriptions  Medication Sig Dispense Refill  . allopurinol (ZYLOPRIM) 300 MG tablet TAKE 1 TABLET BY MOUTH ONCE A DAY 90 tablet 3  .  ALPRAZolam (XANAX) 1 MG tablet TAKE 1/2 TO 1 TABLET BY MOUTH 3 TIMES A DAY AS NEEDED 90 tablet 3  . aspirin 81 MG tablet Take 81 mg by mouth daily.    . Blood Glucose Monitoring Suppl (FREESTYLE FREEDOM LITE) W/DEVICE KIT Use as directed 1 each 0  . cetirizine (ZYRTEC) 10 MG tablet Take 10 mg by mouth daily.    . colchicine 0.6 MG tablet Take 1 tablet (0.6 mg total) by mouth 2 (two) times daily. 60 tablet 5  . doxazosin (CARDURA) 8 MG tablet TAKE 1 TABLET BY MOUTH AT BEDTIME 30 tablet 5  . DULoxetine (CYMBALTA) 60 MG capsule Take 1 capsule (60 mg total) by mouth daily. 30 capsule 11  . esomeprazole (NEXIUM) 40 MG capsule Take 1 capsule (40 mg total) by mouth daily at 12 noon. 90 capsule 3  . furosemide (LASIX) 40 MG tablet TAKE 1 TABLET BY MOUTH ONCE A DAY 90 tablet 3  . glimepiride (AMARYL) 2 MG tablet Take 2 mg by mouth daily with breakfast.    . glucose blood (FREESTYLE LITE) test strip Use as directed to check blood sugar twice daily.  Dx:  E11.29 200 each 3  . HYDROcodone-acetaminophen (NORCO/VICODIN) 5-325 MG tablet Take 1 tablet by mouth 3 (three) times daily as needed. 60 tablet 0  . levothyroxine (SYNTHROID, LEVOTHROID) 100 MCG tablet TAKE 1 TABLET BY MOUTH EVERY DAY EXCEPT MONDAY AND WEDNESDAY. TAKE 2 TABLETS ON MON. AND WED 90 tablet 2  . Magnesium 500 MG TABS Take by mouth.    . multivitamin (METANX) 3-35-2 MG TABS tablet TAKE ONE TABLET BY MOUTH TWO TIMES DAILY 180 tablet 0  . pregabalin (LYRICA) 100 MG capsule Take 1 capsule (100 mg total) by mouth 3 (three) times daily. 90 capsule 5  . simvastatin (ZOCOR) 40 MG tablet Take 1 tablet (40 mg total) by mouth at bedtime. 30 tablet 3  . Testosterone Propionate 2 % CREA Place 1 mL onto the skin daily.    Marland Kitchen triamcinolone cream (KENALOG) 0.1 % Use prn.    . trimethoprim-polymyxin b (POLYTRIM) ophthalmic solution Place 1 drop into both eyes every 4 (four) hours. 10 mL 0  . vitamin B-12 (CYANOCOBALAMIN) 1000 MCG tablet Take 500 mcg by mouth daily.     . Vitamins A & D (VITAMIN A & D) 8000-400 UNITS CAPS Take by mouth.     No current facility-administered medications for this visit.      Marland Kitchen  PHYSICAL EXAMINATION:   Filed Vitals:   06/07/15 1410  BP: 156/85  Pulse: 81  Temp: 98.3 F (36.8 C)  Resp: 18   Filed Weights   06/07/15 1410  Weight: 260 lb 11.1 oz (118.25 kg)    GENERAL: Well-nourished well-developed; Alert, no distress and comfortable. Obese.  Alone.  EYES: no pallor or icterus OROPHARYNX: no thrush or ulceration; good dentition  NECK: supple, no masses felt LYMPH:  no palpable lymphadenopathy in the cervical, axillary or inguinal regions LUNGS: clear to auscultation and  No wheeze or crackles HEART/CVS: regular rate & rhythm and no murmurs; No lower extremity edema ABDOMEN: abdomen soft, non-tender and normal bowel sounds Musculoskeletal:no cyanosis of digits and no clubbing  PSYCH: alert & oriented x 3 with fluent speech NEURO: no focal  motor/sensory deficits SKIN:  no rashes or significant lesions  LABORATORY DATA:  I have reviewed the data as listed Lab Results  Component Value Date   WBC 5.8 06/01/2015   HGB 12.6* 06/01/2015  HCT 38.6* 06/01/2015   MCV 86.2 06/01/2015   PLT 57* 06/01/2015    Recent Labs  11/24/14 1105 02/27/15 0925 05/31/15 1107  NA 139 139 140  K 3.8 3.5 3.9  CL 97 99 99  CO2 32 31 33*  GLUCOSE 180* 172* 106*  BUN _0 CREATININE 1.39 1.27 1.40  CALCIUM 9.8 9.3 9.5  PROT 7.1 6.5 7.0  ALBUMIN 4.1 3.9 4.4  AST _1 ALT _2 ALKPHOS 87 74 81  BILITOT 0.9 0.9 1.0     ASSESSMENT & PLAN:   # Moderate Thrombocytopenia/Mild anemia-  Most recent platelet count of 57 [ down from 98 a day prior]- mild anemia- 12; Normal- white count..  The etiology is unclear.  I discussed the  Widely differential-  Including viral infections immune process;   Less likely but important-  Malignant process.     Recommend CBC CMP and LDH;  Recommend ultrasound of the abdomen-  Cirrhosis/splenomegaly.  Given the mild anemia/ chronic kidney disease recommend checking-  Myeloma workup.  #  Also discussed with the patient that if patient's  Above workup is negative for any etiology-  Bone marrow biopsy might be recommended for further evaluation.   #  Patient will follow-up with me in approximately 2 weeks or sooner/  To review the results of the above workup.  Thank you Dr.Walker for allowing me to participate in the care of your pleasant patient. Please do not hesitate to contact me with questions or concerns in the interim.  # 30 minutes face-to-face with the patient discussing the above plan of care; more than 50% of time spent on counseling and coordination.     Cammie Sickle, MD 06/07/2015 2:20 PM

## 2015-06-08 LAB — MULTIPLE MYELOMA PANEL, SERUM
Albumin SerPl Elph-Mcnc: 3.6 g/dL (ref 2.9–4.4)
Albumin/Glob SerPl: 1.3 (ref 0.7–1.7)
Alpha 1: 0.2 g/dL (ref 0.0–0.4)
Alpha2 Glob SerPl Elph-Mcnc: 0.8 g/dL (ref 0.4–1.0)
B-Globulin SerPl Elph-Mcnc: 1 g/dL (ref 0.7–1.3)
Gamma Glob SerPl Elph-Mcnc: 0.9 g/dL (ref 0.4–1.8)
Globulin, Total: 3 g/dL (ref 2.2–3.9)
IgA: 157 mg/dL (ref 61–437)
IgG (Immunoglobin G), Serum: 854 mg/dL (ref 700–1600)
IgM, Serum: 46 mg/dL (ref 20–172)
Total Protein ELP: 6.6 g/dL (ref 6.0–8.5)

## 2015-06-08 LAB — KAPPA/LAMBDA LIGHT CHAINS
Kappa free light chain: 30.45 mg/L — ABNORMAL HIGH (ref 3.30–19.40)
Kappa, lambda light chain ratio: 1.87 — ABNORMAL HIGH (ref 0.26–1.65)
Lambda free light chains: 16.28 mg/L (ref 5.71–26.30)

## 2015-06-08 LAB — SOLUBLE TRANSFERRIN RECEPTOR: Transferrin Receptor: 27.4 nmol/L — ABNORMAL HIGH (ref 12.2–27.3)

## 2015-06-08 LAB — HEPATITIS C ANTIBODY: HCV Ab: <0.1 s/co ratio (ref 0.0–0.9)

## 2015-06-08 LAB — HEPATITIS B SURFACE ANTIGEN: Hepatitis B Surface Ag: NEGATIVE

## 2015-06-08 NOTE — Telephone Encounter (Signed)
Sent to the pharmacy by e-scribe.  Pt has upcoming appt on 08/30/15

## 2015-06-11 ENCOUNTER — Ambulatory Visit
Admission: RE | Admit: 2015-06-11 | Discharge: 2015-06-11 | Disposition: A | Discharge status: Home / Self Care | Payer: Medicare Other | Source: Ambulatory Visit | Attending: Internal Medicine | Admitting: Internal Medicine

## 2015-06-11 DIAGNOSIS — R161 Splenomegaly, not elsewhere classified: Secondary | ICD-10-CM | POA: Diagnosis not present

## 2015-06-11 DIAGNOSIS — K7689 Other specified diseases of liver: Secondary | ICD-10-CM | POA: Insufficient documentation

## 2015-06-11 DIAGNOSIS — K760 Fatty (change of) liver, not elsewhere classified: Secondary | ICD-10-CM | POA: Insufficient documentation

## 2015-06-11 DIAGNOSIS — D696 Thrombocytopenia, unspecified: Secondary | ICD-10-CM | POA: Diagnosis not present

## 2015-06-21 ENCOUNTER — Telehealth: Payer: Self-pay | Admitting: Internal Medicine

## 2015-06-21 ENCOUNTER — Inpatient Hospital Stay: Payer: Medicare Other | Attending: Internal Medicine | Admitting: Internal Medicine

## 2015-06-21 VITALS — BP 142/98 | HR 76 | Temp 98.1°F | Wt 262.3 lb

## 2015-06-21 DIAGNOSIS — D649 Anemia, unspecified: Secondary | ICD-10-CM | POA: Insufficient documentation

## 2015-06-21 DIAGNOSIS — I129 Hypertensive chronic kidney disease with stage 1 through stage 4 chronic kidney disease, or unspecified chronic kidney disease: Secondary | ICD-10-CM | POA: Diagnosis not present

## 2015-06-21 DIAGNOSIS — F329 Major depressive disorder, single episode, unspecified: Secondary | ICD-10-CM | POA: Insufficient documentation

## 2015-06-21 DIAGNOSIS — E1122 Type 2 diabetes mellitus with diabetic chronic kidney disease: Secondary | ICD-10-CM | POA: Diagnosis not present

## 2015-06-21 DIAGNOSIS — M109 Gout, unspecified: Secondary | ICD-10-CM | POA: Insufficient documentation

## 2015-06-21 DIAGNOSIS — G4733 Obstructive sleep apnea (adult) (pediatric): Secondary | ICD-10-CM | POA: Insufficient documentation

## 2015-06-21 DIAGNOSIS — N281 Cyst of kidney, acquired: Secondary | ICD-10-CM | POA: Insufficient documentation

## 2015-06-21 DIAGNOSIS — D696 Thrombocytopenia, unspecified: Secondary | ICD-10-CM | POA: Insufficient documentation

## 2015-06-21 DIAGNOSIS — Z7982 Long term (current) use of aspirin: Secondary | ICD-10-CM | POA: Diagnosis not present

## 2015-06-21 DIAGNOSIS — Z79899 Other long term (current) drug therapy: Secondary | ICD-10-CM | POA: Insufficient documentation

## 2015-06-21 DIAGNOSIS — Z87442 Personal history of urinary calculi: Secondary | ICD-10-CM | POA: Insufficient documentation

## 2015-06-21 NOTE — Telephone Encounter (Signed)
Spoke to Dr. Holley Raring- recommends follow up in his office/ also fax copy of the abdomen ultrasound to his office.   Douglas [name]- Please inform pt of above.

## 2015-06-21 NOTE — Progress Notes (Signed)
[place] NOTE  Patient Care Team: Jackolyn Confer, MD as PCP - General (Internal Medicine)  CHIEF COMPLAINTS/PURPOSE OF CONSULTATION:   # FEB 2017-  Thrombocytopenia [60s-90]; REPEAT- platelets- 190 [? Viral infection]; Hepatitis- w/u- neg.   # FEB 2017- Renal cysts' Follow up Dr.Lateef.   # Diabetic neuropathy/ CKD [creatinine- 1.3]  HISTORY OF PRESENTING ILLNESS:  Douglas [name] 70 y.o.  male  Is to review the results of his work up for thrombocytopenia.  Patient denies any nosebleeds or gum bleeding.   Patient has chronic mild swelling in the legs.  Patient has chronic fairly intense neuropathy from his diabetes.  ROS: A complete 10 point review of system is done which is negative except mentioned above in history of present illness  MEDICAL HISTORY:  Past Medical History  Diagnosis Date  . Neuropathy (HCC)     diabetes  . Hypertension   . Diabetes mellitus without complication ([place])   . Gout   . Depression   . OSA (obstructive sleep apnea)     supplemental oxygen at night  . Syncope and collapse   . Kidney stones     Dr. Rogers Blocker  . S/P cardiac cath 1998    [facility]    SURGICAL HISTORY: Past Surgical History  Procedure Laterality Date  . Knee surgery      arthroscopy  . Back surgery      rupture disc lumbar spine  . Shoulder surgery Bilateral     arthroscopic right, rotator cuff repair left    SOCIAL HISTORY: Social History   Social History  . Marital Status: Married    Spouse Name: [name]  . Number of Children: 3  . Years of Education: 13   Occupational History  .      retired   Social History Main Topics  . Smoking status: Never Smoker   . Smokeless tobacco: Never Used  . Alcohol Use: No  . Drug Use: No  . Sexual Activity: Yes   Other Topics Concern  . Not on file   Social History Narrative   Lives in [place] with his wife ([name]). No children. Three step children. No pets.      Work - Patient is  retired. Maintenance supervisor.      School - One year college education.      Right handed.      [place], served in [place], no combat                FAMILY HISTORY: Family History  Problem Relation Age of Onset  . Breast cancer Mother   . Lung cancer Mother   . Bone cancer Mother   . Heart Problems Mother   . Cancer Mother     breast, lung and rib  . AAA (abdominal aortic aneurysm) Mother   . Heart disease Mother   . Heart attack Father   . Heart disease Father   . Heart attack Brother   . Cancer Brother     esophageal    ALLERGIES:  is allergic to atenolol; hctz; bee venom; enalapril; and gabapentin.  MEDICATIONS:  Current Outpatient Prescriptions  Medication Sig Dispense Refill  . allopurinol (ZYLOPRIM) 300 MG tablet TAKE 1 TABLET BY MOUTH ONCE A DAY 90 tablet 3  . ALPRAZolam (XANAX) 1 MG tablet TAKE 1/2 TO 1 TABLET BY MOUTH 3 TIMES A DAY AS NEEDED 90 tablet 3  . aspirin 81 MG tablet Take 81 mg by mouth  daily.    . Blood Glucose Monitoring Suppl (FREESTYLE FREEDOM LITE) W/DEVICE KIT Use as directed 1 each 0  . cetirizine (ZYRTEC) 10 MG tablet Take 10 mg by mouth daily.    . colchicine 0.6 MG tablet Take 1 tablet (0.6 mg total) by mouth 2 (two) times daily. 60 tablet 5  . doxazosin (CARDURA) 8 MG tablet TAKE 1 TABLET BY MOUTH EVERY NIGHT AT BEDTIME 30 tablet 2  . DULoxetine (CYMBALTA) 60 MG capsule Take 1 capsule (60 mg total) by mouth daily. 30 capsule 11  . esomeprazole (NEXIUM) 40 MG capsule Take 1 capsule (40 mg total) by mouth daily at 12 noon. 90 capsule 3  . furosemide (LASIX) 40 MG tablet TAKE 1 TABLET BY MOUTH ONCE A DAY 90 tablet 3  . glimepiride (AMARYL) 2 MG tablet Take 2 mg by mouth daily with breakfast.    . glucose blood (FREESTYLE LITE) test strip Use as directed to check blood sugar twice daily.  Dx: E11.29 200 each 3  . HYDROcodone-acetaminophen (NORCO/VICODIN) 5-325 MG tablet Take 1 tablet by mouth 3 (three) times daily as needed.  60 tablet 0  . levothyroxine (SYNTHROID, LEVOTHROID) 100 MCG tablet TAKE 1 TABLET BY MOUTH EVERY DAY EXCEPT MONDAY AND WEDNESDAY. TAKE 2 TABLETS ON MON. AND WED 90 tablet 2  . Magnesium 500 MG TABS Take by mouth.    . multivitamin (METANX) 3-35-2 MG TABS tablet TAKE ONE TABLET BY MOUTH TWO TIMES DAILY 180 tablet 0  . pregabalin (LYRICA) 100 MG capsule Take 1 capsule (100 mg total) by mouth 3 (three) times daily. 90 capsule 5  . simvastatin (ZOCOR) 40 MG tablet Take 1 tablet (40 mg total) by mouth at bedtime. 30 tablet 3  . Testosterone Propionate 2 % CREA Place 1 mL onto the skin daily.    Marland Kitchen triamcinolone cream (KENALOG) 0.1 % Use prn.    . trimethoprim-polymyxin b (POLYTRIM) ophthalmic solution Place 1 drop into both eyes every 4 (four) hours. 10 mL 0  . vitamin B-12 (CYANOCOBALAMIN) 1000 MCG tablet Take 500 mcg by mouth daily.     . Vitamins A & D (VITAMIN A & D) 8000-400 UNITS CAPS Take by mouth.     No current facility-administered medications for this visit.      Marland Kitchen  PHYSICAL EXAMINATION:   Filed Vitals:   06/21/15 1050  BP: 142/98  Pulse: 76  Temp: 98.1 F (36.7 C)   Filed Weights   06/21/15 1050  Weight: 262 lb 5.6 oz (119 kg)    GENERAL: Well-nourished well-developed; Alert, no distress and comfortable. Obese.  Alone.    LABORATORY DATA:  I have reviewed the data as listed Lab Results  Component Value Date   WBC 8.0 06/07/2015   HGB 13.6 06/07/2015   HCT 41.2 06/07/2015   MCV 86.3 06/07/2015   PLT 190 06/07/2015    Recent Labs  02/27/15 0925 05/31/15 1107 06/07/15 1517  NA 139 140 138  K 3.5 3.9 3.5  CL 99 99 101  CO2 31 33* 30  GLUCOSE 172* 106* 131*  BUN 12 12 19   CREATININE 1.27 1.40 1.37*  CALCIUM 9.3 9.5 9.5  GFRNONAA  --   --  51*  GFRAA  --   --  59*  PROT 6.5 7.0 7.3  ALBUMIN 3.9 4.4 4.3  AST 24 18 23   ALT 23 16 19   ALKPHOS 74 81 73  BILITOT 0.9 1.0 0.7     ASSESSMENT & PLAN:   #  Moderate Thrombocytopenia/Mild anemia- the repeat  blood work shows- platelet is 190/hemoglobin 13. Monoclonal workup negative-except for mildly elevated Lambda light chain ratio-most likely secondary to CKD  # CKD/renal cysts- I spoke to patient's nephrologist Dr.lateef- who states that patient had previous renal cysts. However he would recommend a follow-up in his clinic/ regarding the reevaluation of kidney cysts. We will also fax a copy of the ultrasound report to the office.  # no further follow-ups are recommended in our office; unless any further abnormalities are noted on his blood counts.  # 15 minutes face-to-face with the patient discussing the above plan of care; more than 50% of time spent on counseling and coordination.     Cammie Sickle, MD 06/21/2015 12:24 PM

## 2015-06-21 NOTE — Telephone Encounter (Signed)
Called nephrology Dr. Holley Raring and got voicemail and left pt name and dob and why he needed appt. Faxed over  U/s and demographic info on pt to [place] office.  Called pt and let him know that both doctors called and spoke and dr Holley Raring felt it would be good if he saw the pt and then made decsion from there.  I told him if he does not hear anything from either me or the kidney doctor by lunch time on Monday to call me and I gave him my direct number to call me.

## 2015-06-21 NOTE — Progress Notes (Signed)
Patient ambulates independently, brought to exam room 4.  Patient denies pain or discomfort, medication record updated, information provided by patient.

## 2015-07-03 ENCOUNTER — Ambulatory Visit (INDEPENDENT_AMBULATORY_CARE_PROVIDER_SITE_OTHER): Payer: Medicare Other

## 2015-07-03 VITALS — BP 140/88 | HR 88 | Temp 97.1°F | Resp 14 | Ht 72.0 in | Wt 267.8 lb

## 2015-07-03 DIAGNOSIS — Z0000 Encounter for general adult medical examination without abnormal findings: Secondary | ICD-10-CM | POA: Diagnosis not present

## 2015-07-03 DIAGNOSIS — Z1211 Encounter for screening for malignant neoplasm of colon: Secondary | ICD-10-CM

## 2015-07-03 NOTE — Progress Notes (Signed)
Subjective:   Douglas [name] is a 70 y.o. male who presents for an Initial Medicare Annual Wellness Visit.  Review of Systems  No ROS.  Medicare Wellness Visit.  Cardiac Risk Factors include: advanced age (>67mn, >>73women);diabetes mellitus;hypertension;male gender;obesity (BMI >30kg/m2)    Objective:    Today's Vitals   07/03/15 1009  BP: 140/88  Pulse: 88  Temp: 97.1 F (36.2 C)  TempSrc: Oral  Resp: 14  Height: 6' (1.829 m)  Weight: 267 lb 12.8 oz (121.473 kg)  SpO2: 95%   Body mass index is 36.31 kg/(m^2).  Current Medications (verified) Outpatient Encounter Prescriptions as of 07/03/2015  Medication Sig  . allopurinol (ZYLOPRIM) 300 MG tablet TAKE 1 TABLET BY MOUTH ONCE A DAY  . ALPRAZolam (XANAX) 1 MG tablet TAKE 1/2 TO 1 TABLET BY MOUTH 3 TIMES A DAY AS NEEDED  . aspirin 81 MG tablet Take 81 mg by mouth daily.  . Blood Glucose Monitoring Suppl (FREESTYLE FREEDOM LITE) W/DEVICE KIT Use as directed  . cetirizine (ZYRTEC) 10 MG tablet Take 10 mg by mouth daily.  . colchicine 0.6 MG tablet Take 1 tablet (0.6 mg total) by mouth 2 (two) times daily.  .Marland Kitchendoxazosin (CARDURA) 8 MG tablet TAKE 1 TABLET BY MOUTH EVERY NIGHT AT BEDTIME  . DULoxetine (CYMBALTA) 60 MG capsule Take 1 capsule (60 mg total) by mouth daily.  .Marland Kitchenesomeprazole (NEXIUM) 40 MG capsule Take 1 capsule (40 mg total) by mouth daily at 12 noon.  . furosemide (LASIX) 40 MG tablet TAKE 1 TABLET BY MOUTH ONCE A DAY  . glimepiride (AMARYL) 2 MG tablet Take 2 mg by mouth daily with breakfast.  . glucose blood (FREESTYLE LITE) test strip Use as directed to check blood sugar twice daily.  Dx: E11.29  . HYDROcodone-acetaminophen (NORCO/VICODIN) 5-325 MG tablet Take 1 tablet by mouth 3 (three) times daily as needed.  .Marland Kitchenlevothyroxine (SYNTHROID, LEVOTHROID) 100 MCG tablet TAKE 1 TABLET BY MOUTH EVERY DAY EXCEPT MONDAY AND WEDNESDAY. TAKE 2 TABLETS ON MON. AND WED  . Magnesium 500 MG TABS Take by mouth.  .  multivitamin (METANX) 3-35-2 MG TABS tablet TAKE ONE TABLET BY MOUTH TWO TIMES DAILY  . pregabalin (LYRICA) 100 MG capsule Take 1 capsule (100 mg total) by mouth 3 (three) times daily.  . simvastatin (ZOCOR) 40 MG tablet Take 1 tablet (40 mg total) by mouth at bedtime.  . Testosterone Propionate 2 % CREA Place 1 mL onto the skin daily.  .Marland Kitchentriamcinolone cream (KENALOG) 0.1 % Use prn.  . trimethoprim-polymyxin b (POLYTRIM) ophthalmic solution Place 1 drop into both eyes every 4 (four) hours.  . vitamin B-12 (CYANOCOBALAMIN) 1000 MCG tablet Take 500 mcg by mouth daily.   . Vitamins A & D (VITAMIN A & D) 8000-400 UNITS CAPS Take by mouth.   No facility-administered encounter medications on file as of 07/03/2015.    Allergies (verified) Atenolol; Hctz; Bee venom; Enalapril; and Gabapentin   History: Past Medical History  Diagnosis Date  . Neuropathy (HCC)     diabetes  . Hypertension   . Diabetes mellitus without complication ([place]   . Gout   . Depression   . OSA (obstructive sleep apnea)     supplemental oxygen at night  . Syncope and collapse   . Kidney stones     Dr. [place]og[place] Blocker . S/P cardiac cath 1998    [facility]   Past Surgical History  Procedure Laterality Date  . Knee surgery  arthroscopy  . Back surgery      rupture disc lumbar spine  . Shoulder surgery Bilateral     arthroscopic right, rotator cuff repair left   Family History  Problem Relation Age of Onset  . Breast cancer Mother   . Lung cancer Mother   . Bone cancer Mother   . Heart Problems Mother   . Cancer Mother     breast, lung and rib  . AAA (abdominal aortic aneurysm) Mother   . Heart disease Mother   . Heart attack Father   . Heart disease Father   . Heart attack Brother   . Cancer Brother     esophageal   Social History   Occupational History  .      retired   Social History Main Topics  . Smoking status: Never Smoker   . Smokeless tobacco: Never Used  . Alcohol Use: No  . Drug Use: No  .  Sexual Activity: Yes   Tobacco Counseling Counseling given: Not Answered   Activities of Daily Living In your present state of health, do you have any difficulty performing the following activities: 07/03/2015  Hearing? N  Vision? N  Difficulty concentrating or making decisions? N  Walking or climbing stairs? Y  Dressing or bathing? N  Doing errands, shopping? N  Preparing Food and eating ? N  Using the Toilet? N  In the past six months, have you accidently leaked urine? Y  Do you have problems with loss of bowel control? N  Managing your Medications? N  Managing your Finances? N  Housekeeping or managing your Housekeeping? N    Immunizations and Health Maintenance Immunization History  Administered Date(s) Administered  . Influenza,inj,Quad PF,36+ Mos 01/04/2013, 02/20/2014, 02/27/2015  . Pneumococcal Conjugate-13 05/02/2013  . Pneumococcal Polysaccharide-23 01/05/2011  . Tdap 01/05/2008   Health Maintenance Due  Topic Date Due  . OPHTHALMOLOGY EXAM  05/24/2011  . COLONOSCOPY  01/04/2013    Patient Care Team: Jackolyn Confer, MD as PCP - General (Internal Medicine)  Indicate any recent Medical Services you may have received from other than [facility] providers in the past year (date may be approximate).    Assessment:   This is a routine wellness examination for Douglas [name]. The goal of the wellness visit is to assist the patient how to close the gaps in care and create a preventative care plan for the patient.   Taking VIT D Calcium as appropriate/Osteoporosis risk reviewed.  Medications reviewed; taking without issues or barriers.  Safety issues reviewed; smoke detectors in the home. Firearms locked in a safe area in the home. Wears seatbelts when driving or riding with others. No violence in the home.  No identified risk were noted; The patient was oriented x 3; appropriate in dress and manner and no objective failures at ADL's or IADL's  Colonoscopy referral  placed.    ZOSTAVAX vaccine postponed, per patient request.  Encouraged to make an eye exam.  Patient Concerns:  Financial assistance for medications; resource for assistance provided.  Aware it is income based and may not meet the requirements. Encouraged to follow up with PCP as needed.  Hearing/Vision screen Hearing Screening Comments: Passes the whisper test Vision Screening Comments: Followed by Dr. Gloriann Loan and Suzie Portela, [place] Wears glasses Last OV 2013  Dietary issues and exercise activities discussed: Exercise limited by: neurologic condition(s)  Goals    . Healthy Lifestyle     Low carb, low sugar foods Stay hydrated, drink plenty of  water     . Increase physical activity     Chair exercises. As demonstrated, as tolerated.      Depression Screen PHQ 2/9 Scores 07/03/2015 05/23/2014  PHQ - 2 Score 0 0    Fall Risk Fall Risk  07/03/2015 05/23/2014  Falls in the past year? No No    Cognitive Function: MMSE - Mini Mental State Exam 07/03/2015  Orientation to time 5  Orientation to Place 5  Registration 3  Attention/ Calculation 5  Recall 3  Language- name 2 objects 2  Language- repeat 1  Language- follow 3 step command 3  Language- read & follow direction 1  Write a sentence 1  Copy design 1  Total score 30    Screening Tests Health Maintenance  Topic Date Due  . OPHTHALMOLOGY EXAM  05/24/2011  . COLONOSCOPY  01/04/2013  . ZOSTAVAX  07/02/2016 (Originally 05/27/2005)  . INFLUENZA VACCINE  11/13/2015  . FOOT EXAM  11/24/2015  . HEMOGLOBIN A1C  11/28/2015  . URINE MICROALBUMIN  05/30/2016  . TETANUS/TDAP  01/04/2018  . Hepatitis C Screening  Completed  . PNA vac Low Risk Adult  Completed        Plan:   End of life planning; Advance aging; Advanced directives discussed. Educational material provided to help him start the conversation with his wife.  Requested copy of AD upon completion.  Time spent discussing HCPOA/Living Will short forms is 25  minutes.      During the course of the visit Ladarrion was educated and counseled about the following appropriate screening and preventive services:   Vaccines to include Pneumoccal, Influenza, Hepatitis B, Td, Zostavax, HCV  Electrocardiogram  Colorectal cancer screening  Cardiovascular disease screening  Diabetes screening  Glaucoma screening  Nutrition counseling  Prostate cancer screening  Smoking cessation counseling  Patient Instructions (the written plan) were given to the patient.   Varney Biles, LPN   [id number]

## 2015-07-03 NOTE — Progress Notes (Signed)
Annual Wellness Visit as completed by Health Coach was reviewed in full.  

## 2015-07-03 NOTE — Patient Instructions (Addendum)
Douglas [name] , Thank you for taking time to come for your Medicare Wellness Visit. I appreciate your ongoing commitment to your health goals. Please review the following plan we discussed and let me know if I can assist you in the future.   Follow up with Dr. Gilford Rile as needed.   This is a list of the screening recommended for you and due dates:  Health Maintenance  Topic Date Due  . Shingles Vaccine  05/27/2005  . Eye exam for diabetics  05/24/2011  . Colon Cancer Screening  01/04/2013  . Flu Shot  11/13/2015  . Complete foot exam   11/24/2015  . Hemoglobin A1C  11/28/2015  . Urine Protein Check  05/30/2016  . Tetanus Vaccine  01/04/2018  .  Hepatitis C: One time screening is recommended by Center for Disease Control  (CDC) for  adults born from [birth year] through 1965.   Completed  . Pneumonia vaccines  Completed    Colonoscopy A colonoscopy is an exam to look at the entire large intestine (colon). This exam can help find problems such as tumors, polyps, inflammation, and areas of bleeding. The exam takes about 1 hour.  LET [facility] CARE PROVIDER KNOW ABOUT:   Any allergies you have.  All medicines you are taking, including vitamins, herbs, eye drops, creams, and over-the-counter medicines.  Previous problems you or members of your family have had with the use of anesthetics.  Any blood disorders you have.  Previous surgeries you have had.  Medical conditions you have. RISKS AND COMPLICATIONS  Generally, this is a safe procedure. However, as with any procedure, complications can occur. Possible complications include:  Bleeding.  Tearing or rupture of the colon wall.  Reaction to medicines given during the exam.  Infection (rare). BEFORE THE PROCEDURE   Ask your health care provider about changing or stopping your regular medicines.  You may be prescribed an oral bowel prep. This involves drinking a large amount of medicated liquid, starting the day before your  procedure. The liquid will cause you to have multiple loose stools until your stool is almost clear or light green. This cleans out your colon in preparation for the procedure.  Do not eat or drink anything else once you have started the bowel prep, unless your health care provider tells you it is safe to do so.  Arrange for someone to drive you home after the procedure. PROCEDURE   You will be given medicine to help you relax (sedative).  You will lie on your side with your knees bent.  A long, flexible tube with a light and camera on the end (colonoscope) will be inserted through the rectum and into the colon. The camera sends video back to a computer screen as it moves through the colon. The colonoscope also releases carbon dioxide gas to inflate the colon. This helps your health care provider see the area better.  During the exam, your health care provider may take a small tissue sample (biopsy) to be examined under a microscope if any abnormalities are found.  The exam is finished when the entire colon has been viewed. AFTER THE PROCEDURE   Do not drive for 24 hours after the exam.  You may have a small amount of blood in your stool.  You may pass moderate amounts of gas and have mild abdominal cramping or bloating. This is caused by the gas used to inflate your colon during the exam.  Ask when your test results will be ready  and how you will get your results. Make sure you get your test results.   This information is not intended to replace advice given to you by your health care provider. Make sure you discuss any questions you have with your health care provider.   Document Released: 03/28/2000 Document Revised: 01/19/2013 Document Reviewed: 12/06/2012 Elsevier Interactive Patient Education [employer].

## 2015-07-04 ENCOUNTER — Telehealth: Payer: Self-pay | Admitting: *Deleted

## 2015-07-04 ENCOUNTER — Encounter: Payer: Self-pay | Admitting: Gastroenterology

## 2015-07-04 NOTE — Telephone Encounter (Signed)
Had faxed over info for an appt to see lateef.  Pt had seen him in the past.  Pt had some cyst in kidneys and it was not clear if it was cyst of further work up needs to be done.  Called last week to check on appt and was told lateef only sees pt in [place].  I had to send it to [place].  Called today and pt has appt next wed and pt was notifed of the appt by dr Holley Raring.

## 2015-07-11 DIAGNOSIS — I10 Essential (primary) hypertension: Secondary | ICD-10-CM | POA: Diagnosis not present

## 2015-07-11 DIAGNOSIS — N281 Cyst of kidney, acquired: Secondary | ICD-10-CM | POA: Diagnosis not present

## 2015-07-11 DIAGNOSIS — R600 Localized edema: Secondary | ICD-10-CM | POA: Diagnosis not present

## 2015-07-11 DIAGNOSIS — N183 Chronic kidney disease, stage 3 (moderate): Secondary | ICD-10-CM | POA: Diagnosis not present

## 2015-07-31 DIAGNOSIS — R3915 Urgency of urination: Secondary | ICD-10-CM | POA: Diagnosis not present

## 2015-07-31 DIAGNOSIS — E291 Testicular hypofunction: Secondary | ICD-10-CM | POA: Diagnosis not present

## 2015-07-31 DIAGNOSIS — R351 Nocturia: Secondary | ICD-10-CM | POA: Diagnosis not present

## 2015-07-31 DIAGNOSIS — N5201 Erectile dysfunction due to arterial insufficiency: Secondary | ICD-10-CM | POA: Diagnosis not present

## 2015-08-01 ENCOUNTER — Telehealth: Payer: Self-pay | Admitting: Internal Medicine

## 2015-08-01 MED ORDER — FUROSEMIDE 40 MG PO TABS: 40.0000 mg | ORAL_TABLET | Freq: Every day | ORAL | Status: DC

## 2015-08-01 MED ORDER — GLIMEPIRIDE 2 MG PO TABS
2.0000 mg | ORAL_TABLET | Freq: Every day | ORAL | Status: DC
Start: 2015-08-01 — End: 2015-08-30

## 2015-08-01 NOTE — Telephone Encounter (Signed)
Refilled patient's meds

## 2015-08-01 NOTE — Telephone Encounter (Signed)
glimepiride (AMARYL) 2 MG tablet  furosemide (LASIX) 40 MG tablet

## 2015-08-08 ENCOUNTER — Ambulatory Visit: Payer: Medicare Other

## 2015-08-08 VITALS — Ht 70.0 in | Wt 264.4 lb

## 2015-08-08 DIAGNOSIS — Z860100 Personal history of colon polyps, unspecified: Secondary | ICD-10-CM

## 2015-08-08 NOTE — Progress Notes (Signed)
No allergies to eggs or soy No past problems with anesthesia No home oxygen No diet meds  Has email and internet; refused emmi 

## 2015-08-20 ENCOUNTER — Other Ambulatory Visit: Payer: Self-pay | Admitting: Internal Medicine

## 2015-08-22 ENCOUNTER — Encounter: Payer: Self-pay | Admitting: Gastroenterology

## 2015-08-22 ENCOUNTER — Ambulatory Visit (AMBULATORY_SURGERY_CENTER): Payer: Medicare Other | Admitting: Gastroenterology

## 2015-08-22 VITALS — BP 133/84 | HR 64 | Temp 97.8°F | Resp 11 | Ht 70.0 in | Wt 264.0 lb

## 2015-08-22 DIAGNOSIS — G4733 Obstructive sleep apnea (adult) (pediatric): Secondary | ICD-10-CM | POA: Diagnosis not present

## 2015-08-22 DIAGNOSIS — E039 Hypothyroidism, unspecified: Secondary | ICD-10-CM | POA: Diagnosis not present

## 2015-08-22 DIAGNOSIS — Z1211 Encounter for screening for malignant neoplasm of colon: Secondary | ICD-10-CM | POA: Diagnosis not present

## 2015-08-22 DIAGNOSIS — F341 Dysthymic disorder: Secondary | ICD-10-CM | POA: Diagnosis not present

## 2015-08-22 DIAGNOSIS — K219 Gastro-esophageal reflux disease without esophagitis: Secondary | ICD-10-CM | POA: Diagnosis not present

## 2015-08-22 DIAGNOSIS — Z86010 Personal history of colonic polyps: Secondary | ICD-10-CM | POA: Diagnosis not present

## 2015-08-22 DIAGNOSIS — E119 Type 2 diabetes mellitus without complications: Secondary | ICD-10-CM | POA: Diagnosis not present

## 2015-08-22 DIAGNOSIS — N400 Enlarged prostate without lower urinary tract symptoms: Secondary | ICD-10-CM | POA: Diagnosis not present

## 2015-08-22 DIAGNOSIS — I10 Essential (primary) hypertension: Secondary | ICD-10-CM | POA: Diagnosis not present

## 2015-08-22 MED ORDER — SODIUM CHLORIDE 0.9 % IV SOLN: 500.0000 mL | INTRAVENOUS | Status: DC

## 2015-08-22 NOTE — Op Note (Signed)
[facility] Patient Name: Douglas [name] Procedure Date: 08/22/2015 8:32 AM MRN: [id number] Endoscopist: Mallie Mussel L. Loletha Carrow , MD Age: 70 Date of Birth: 30-Sep-1945 Gender: Male Procedure:                Colonoscopy Indications:              Screening for colorectal malignant neoplasm Medicines:                Monitored Anesthesia Care Procedure:                Pre-Anesthesia Assessment:                           - Prior to the procedure, a History and Physical                            was performed, and patient medications and                            allergies were reviewed. The patient's tolerance of                            previous anesthesia was also reviewed. The risks                            and benefits of the procedure and the sedation                            options and risks were discussed with the patient.                            All questions were answered, and informed consent                            was obtained. Prior Anticoagulants: The patient has                            taken no previous anticoagulant or antiplatelet                            agents. ASA Grade Assessment: III - A patient with                            severe systemic disease. After reviewing the risks                            and benefits, the patient was deemed in                            satisfactory condition to undergo the procedure.                           After obtaining informed consent, the colonoscope  was passed under direct vision. Throughout the                            procedure, the patient's blood pressure, pulse, and                            oxygen saturations were monitored continuously. The                            Model CF-HQ190L [id number]) scope was introduced                            through the anus and advanced to the the cecum,                            identified by appendiceal orifice and ileocecal                           valve. The colonoscopy was performed without                            difficulty. The patient tolerated the procedure                            well. The quality of the bowel preparation was                            excellent. The ileocecal valve, appendiceal                            orifice, and rectum were photographed. Scope In: 8:44:12 AM Scope Out: 8:54:54 AM Scope Withdrawal Time: 0 hours 8 minutes 29 seconds  Total Procedure Duration: 0 hours 10 minutes 42 seconds  Findings:                 The digital rectal exam findings include decreased                            sphincter tone.                           A few small-mouthed diverticula were found in the                            cecum.                           The exam was otherwise without abnormality on                            direct and retroflexion views. Complications:            No immediate complications. Estimated Blood Loss:     Estimated blood loss: none. Impression:               - Decreased sphincter tone found on digital rectal  exam.                           - Diverticulosis in the cecum.                           - The examination was otherwise normal on direct                            and retroflexion views.                           - No specimens collected. Recommendation:           - Patient has a contact number available for                            emergencies. The signs and symptoms of potential                            delayed complications were discussed with the                            patient. Return to normal activities tomorrow.                            Written discharge instructions were provided to the                            patient.                           - Resume previous diet.                           - Continue present medications.                           - No repeat colonoscopy due to age. Douglas [name] L. Loletha Carrow,  MD 08/22/2015 8:58:37 AM This report has been signed electronically.

## 2015-08-22 NOTE — Progress Notes (Signed)
Report to PACU, RN, vss, BBS= Clear.  

## 2015-08-22 NOTE — Patient Instructions (Signed)
YOU HAD AN ENDOSCOPIC PROCEDURE TODAY AT [facility]:   Refer to the procedure report that was given to you for any specific questions about what was found during the examination.  If the procedure report does not answer your questions, please call your gastroenterologist to clarify.  If you requested that your care partner not be given the details of your procedure findings, then the procedure report has been included in a sealed envelope for you to review at your convenience later.  YOU SHOULD EXPECT: Some feelings of bloating in the abdomen. Passage of more gas than usual.  Walking can help get rid of the air that was put into your GI tract during the procedure and reduce the bloating. If you had a lower endoscopy (such as a colonoscopy or flexible sigmoidoscopy) you may notice spotting of blood in your stool or on the toilet paper. If you underwent a bowel prep for your procedure, you may not have a normal bowel movement for a few days.  Please Note:  You might notice some irritation and congestion in your nose or some drainage.  This is from the oxygen used during your procedure.  There is no need for concern and it should clear up in a day or so.  SYMPTOMS TO REPORT IMMEDIATELY:   Following lower endoscopy (colonoscopy or flexible sigmoidoscopy):  Excessive amounts of blood in the stool  Significant tenderness or worsening of abdominal pains  Swelling of the abdomen that is new, acute  Fever of 100F or higher    For urgent or emergent issues, a gastroenterologist can be reached at any hour by calling [phone].   DIET: Your first meal following the procedure should be a small meal and then it is ok to progress to your normal diet. Heavy or fried foods are harder to digest and may make you feel nauseous or bloated.  Likewise, meals heavy in dairy and vegetables can increase bloating.  Drink plenty of fluids but you should avoid alcoholic beverages for 24  hours.  ACTIVITY:  You should plan to take it easy for the rest of today and you should NOT DRIVE or use heavy machinery until tomorrow (because of the sedation medicines used during the test).    FOLLOW UP: Our staff will call the number listed on your records the next business day following your procedure to check on you and address any questions or concerns that you may have regarding the information given to you following your procedure. If we do not reach you, we will leave a message.  However, if you are feeling well and you are not experiencing any problems, there is no need to return our call.  We will assume that you have returned to your regular daily activities without incident.  If any biopsies were taken you will be contacted by phone or by letter within the next 1-3 weeks.  Please call us at [phone] if you have not heard about the biopsies in 3 weeks.    SIGNATURES/CONFIDENTIALITY: You and/or your care partner have signed paperwork which will be entered into your electronic medical record.  These signatures attest to the fact that that the information above on your After Visit Summary has been reviewed and is understood.  Full responsibility of the confidentiality of this discharge information lies with you and/or your care-partner.   Resume medications. Information given on diverticulosis and high fiber diet.

## 2015-08-23 ENCOUNTER — Telehealth: Payer: Self-pay

## 2015-08-23 NOTE — Telephone Encounter (Signed)
Attempt post procedure call back. No answer, left vm.

## 2015-08-30 ENCOUNTER — Encounter: Payer: Self-pay | Admitting: Internal Medicine

## 2015-08-30 ENCOUNTER — Ambulatory Visit (INDEPENDENT_AMBULATORY_CARE_PROVIDER_SITE_OTHER): Payer: Medicare Other | Admitting: Internal Medicine

## 2015-08-30 ENCOUNTER — Telehealth: Payer: Self-pay

## 2015-08-30 VITALS — BP 140/90 | HR 74 | Temp 98.5°F | Wt 264.4 lb

## 2015-08-30 DIAGNOSIS — E1142 Type 2 diabetes mellitus with diabetic polyneuropathy: Secondary | ICD-10-CM | POA: Diagnosis not present

## 2015-08-30 DIAGNOSIS — D696 Thrombocytopenia, unspecified: Secondary | ICD-10-CM

## 2015-08-30 DIAGNOSIS — N183 Chronic kidney disease, stage 3 unspecified: Secondary | ICD-10-CM

## 2015-08-30 DIAGNOSIS — H1010 Acute atopic conjunctivitis, unspecified eye: Secondary | ICD-10-CM

## 2015-08-30 DIAGNOSIS — I10 Essential (primary) hypertension: Secondary | ICD-10-CM

## 2015-08-30 LAB — COMPREHENSIVE METABOLIC PANEL
ALT: 15 U/L (ref 0–53)
AST: 19 U/L (ref 0–37)
Albumin: 4.2 g/dL (ref 3.5–5.2)
Alkaline Phosphatase: 75 U/L (ref 39–117)
BUN: 17 mg/dL (ref 6–23)
CO2: 33 mEq/L — ABNORMAL HIGH (ref 19–32)
Calcium: 9.6 mg/dL (ref 8.4–10.5)
Chloride: 100 mEq/L (ref 96–112)
Creatinine, Ser: 1.5 mg/dL (ref 0.40–1.50)
GFR: 49.14 mL/min — ABNORMAL LOW (ref >60.00)
Glucose, Bld: 120 mg/dL — ABNORMAL HIGH (ref 70–99)
Potassium: 3.8 mEq/L (ref 3.5–5.1)
Sodium: 140 mEq/L (ref 135–145)
Total Bilirubin: 0.9 mg/dL (ref 0.2–1.2)
Total Protein: 7 g/dL (ref 6.0–8.3)

## 2015-08-30 LAB — LIPID PANEL
Cholesterol: 130 mg/dL (ref 0–200)
HDL: 38.6 mg/dL — ABNORMAL LOW (ref >39.00)
LDL Cholesterol: 64 mg/dL (ref 0–99)
NonHDL: 90.96
Total CHOL/HDL Ratio: 3
Triglycerides: 137 mg/dL (ref 0.0–149.0)
VLDL: 27.4 mg/dL (ref 0.0–40.0)

## 2015-08-30 LAB — HEMOGLOBIN A1C: Hgb A1c MFr Bld: 6.4 % (ref 4.6–6.5)

## 2015-08-30 MED ORDER — LEVOTHYROXINE SODIUM 100 MCG PO TABS: ORAL_TABLET | ORAL | Status: DC

## 2015-08-30 MED ORDER — SIMVASTATIN 40 MG PO TABS: 40.0000 mg | ORAL_TABLET | Freq: Every day | ORAL | Status: DC

## 2015-08-30 MED ORDER — OLOPATADINE HCL 0.2 % OP SOLN: 1.0000 [drp] | Freq: Every day | OPHTHALMIC | Status: DC

## 2015-08-30 MED ORDER — ALPRAZOLAM 1 MG PO TABS: ORAL_TABLET | ORAL | Status: DC

## 2015-08-30 MED ORDER — DOXAZOSIN MESYLATE 8 MG PO TABS: 8.0000 mg | ORAL_TABLET | Freq: Every day | ORAL | Status: DC

## 2015-08-30 MED ORDER — GLIMEPIRIDE 2 MG PO TABS: 2.0000 mg | ORAL_TABLET | Freq: Two times a day (BID) | ORAL | Status: DC

## 2015-08-30 NOTE — Assessment & Plan Note (Signed)
Chronic kidney disease. Will repeat renal function with labs today. Continue follow up with nephrology.

## 2015-08-30 NOTE — Telephone Encounter (Signed)
PA was completed on online for Pataday on Cover my meds.

## 2015-08-30 NOTE — Assessment & Plan Note (Signed)
BP Readings from Last 3 Encounters:  08/30/15 140/90  08/22/15 133/84  07/03/15 140/88   BP generally well controlled. Slightly elevated today. Will monitor for now. Continue current medications.

## 2015-08-30 NOTE — Assessment & Plan Note (Signed)
Reviewed notes from oncology. Transient thrombocytopenia likely caused by platelet clumping and also exacerbated by CKD. Will continue to monitor.

## 2015-08-30 NOTE — Progress Notes (Signed)
Subjective:    Patient ID: Douglas [name], male    DOB: 07-Aug-1945, 70 y.o.   MRN: [id number]  HPI  70YO male presents for follow up.  DM - BG well controlled 80-120 mostly. Compliant with medication.  Recently completed colonoscopy which was normal.  Notes recent watery eyes, with some matting of eyelids in the morning. Taking OTC allergy medication with no improvement. Used Pataday in the past with some improvement.  Also recently seen by oncology for low platelets. Evaluation including additional labwork and abdominal US unrevealing as to cause of low platelets. On some samples, platelets appeared to be clumped. On others, platelets were low and not clumped. Per oncology report, no further workup needed.  Wt Readings from Last 3 Encounters:  08/30/15 264 lb 6.4 oz (119.931 kg)  08/22/15 264 lb (119.75 kg)  08/08/15 264 lb 6.4 oz (119.931 kg)   BP Readings from Last 3 Encounters:  08/30/15 140/90  08/22/15 133/84  07/03/15 140/88    Past Medical History  Diagnosis Date  . Neuropathy (HCC)     diabetes  . Hypertension   . Diabetes mellitus without complication ([place])   . Gout   . Depression   . OSA (obstructive sleep apnea)     supplemental oxygen at night  . Syncope and collapse   . Kidney stones     Dr. Rogers Blocker  . S/P cardiac cath 1998    [facility]  . Kidney cysts   . Temporary low platelet count (HCC)    Family History  Problem Relation Age of Onset  . Breast cancer Mother   . Lung cancer Mother   . Bone cancer Mother   . Heart Problems Mother   . Cancer Mother     breast, lung and rib  . AAA (abdominal aortic aneurysm) Mother   . Heart disease Mother   . Heart attack Father   . Heart disease Father   . Heart attack Brother   . Cancer Brother     esophageal  . Colon cancer Neg Hx    Past Surgical History  Procedure Laterality Date  . Knee surgery      arthroscopy  . Back surgery      rupture disc lumbar spine  . Shoulder surgery Bilateral    arthroscopic right, rotator cuff repair left   Social History   Social History  . Marital Status: Married    Spouse Name: [name]  . Number of Children: 3  . Years of Education: 13   Occupational History  .      retired   Social History Main Topics  . Smoking status: Never Smoker   . Smokeless tobacco: Never Used  . Alcohol Use: No  . Drug Use: No  . Sexual Activity: Yes   Other Topics Concern  . None   Social History Narrative   Lives in [place] with his wife ([name]). No children. Three step children. No pets.      Work - Patient is retired. Maintenance supervisor.      School - One year college education.      Right handed.      [place], served in [place], no combat                Review of Systems  Constitutional: Negative for fever, chills, activity change, appetite change, fatigue and unexpected weight change.  HENT: Positive for congestion, rhinorrhea and sneezing. Negative for sinus pressure, sore throat, tinnitus, trouble swallowing  and voice change.   Eyes: Positive for discharge and itching. Negative for photophobia, pain, redness and visual disturbance.  Respiratory: Negative for cough and shortness of breath.   Cardiovascular: Negative for chest pain, palpitations and leg swelling.  Gastrointestinal: Negative for nausea, vomiting, abdominal pain, diarrhea, constipation and abdominal distention.  Genitourinary: Negative for dysuria, urgency and difficulty urinating.  Musculoskeletal: Positive for myalgias and arthralgias. Negative for gait problem.  Skin: Negative for color change and rash.  Neurological: Positive for numbness. Negative for weakness.  Hematological: Negative for adenopathy.  Psychiatric/Behavioral: Negative for suicidal ideas, sleep disturbance and dysphoric mood. The patient is not nervous/anxious.        Objective:    BP 140/90 mmHg  Pulse 74  Temp(Src) 98.5 F (36.9 C) (Oral)  Wt 264 lb 6.4 oz (119.931  kg) Physical Exam  Constitutional: He is oriented to person, place, and time. He appears well-developed and well-nourished. No distress.  HENT:  Head: Normocephalic and atraumatic.  Right Ear: External ear normal.  Left Ear: External ear normal.  Nose: Nose normal.  Mouth/Throat: Oropharynx is clear and moist. No oropharyngeal exudate.  Eyes: Conjunctivae and EOM are normal. Pupils are equal, round, and reactive to light. Right eye exhibits no discharge. Left eye exhibits no discharge. No scleral icterus.  Neck: Normal range of motion. Neck supple. No tracheal deviation present. No thyromegaly present.  Cardiovascular: Normal rate, regular rhythm and normal heart sounds.  Exam reveals no gallop and no friction rub.   No murmur heard. Pulmonary/Chest: Effort normal and breath sounds normal. No accessory muscle usage. No tachypnea. No respiratory distress. He has no decreased breath sounds. He has no wheezes. He has no rhonchi. He has no rales. He exhibits no tenderness.  Musculoskeletal: Normal range of motion. He exhibits no edema.  Lymphadenopathy:    He has no cervical adenopathy.  Neurological: He is alert and oriented to person, place, and time. No cranial nerve deficit. Coordination normal.  Skin: Skin is warm and dry. No rash noted. He is not diaphoretic. No erythema. No pallor.  Psychiatric: He has a normal mood and affect. His behavior is normal. Judgment and thought content normal.          Assessment & Plan:   Problem List Items Addressed This Visit      Unprioritized   Allergic conjunctivitis    Worsening symptoms with seasonal allergens. Will restart Pataday.      Chronic kidney disease    Chronic kidney disease. Will repeat renal function with labs today. Continue follow up with nephrology.      DM (diabetes mellitus), type 2, uncontrolled, with renal complications ([place]) - Primary    BG well controlled by report. Will recheck A1c with labs. Continue current  medications.      Relevant Medications   simvastatin (ZOCOR) 40 MG tablet   glimepiride (AMARYL) 2 MG tablet   Essential hypertension    BP Readings from Last 3 Encounters:  08/30/15 140/90  08/22/15 133/84  07/03/15 140/88   BP generally well controlled. Slightly elevated today. Will monitor for now. Continue current medications.      Relevant Medications   simvastatin (ZOCOR) 40 MG tablet   doxazosin (CARDURA) 8 MG tablet   Thrombocytopenia ([place])    Reviewed notes from oncology. Transient thrombocytopenia likely caused by platelet clumping and also exacerbated by CKD. Will continue to monitor.          Return in about 3 months (around 11/30/2015) for Recheck of  Diabetes.  Ronette Deter, MD Internal Medicine [facility]

## 2015-08-30 NOTE — Assessment & Plan Note (Signed)
BG well controlled by report. Will recheck A1c with labs. Continue current medications.

## 2015-08-30 NOTE — Assessment & Plan Note (Signed)
Worsening symptoms with seasonal allergens. Will restart Pataday.

## 2015-08-30 NOTE — Patient Instructions (Signed)
Labs today.  Follow up in 3 months and sooner as needed. 

## 2015-08-30 NOTE — Progress Notes (Signed)
Pre visit review using our clinic review tool, if applicable. No additional management support is needed unless otherwise documented below in the visit note. 

## 2015-08-31 ENCOUNTER — Telehealth: Payer: Self-pay

## 2015-08-31 ENCOUNTER — Other Ambulatory Visit: Payer: Medicare Other

## 2015-08-31 ENCOUNTER — Telehealth: Payer: Self-pay | Admitting: Internal Medicine

## 2015-08-31 LAB — MICROALBUMIN / CREATININE URINE RATIO
Creatinine,U: 45.9 mg/dL
Microalb Creat Ratio: 1.5 mg/g (ref 0.0–30.0)
Microalb, Ur: <0.7 mg/dL (ref 0.0–1.9)

## 2015-08-31 NOTE — Telephone Encounter (Signed)
PA for Xanax completed on Cover my meds.

## 2015-08-31 NOTE — Telephone Encounter (Signed)
Received a call from [facility] for the approval on the patient's . The patient has been approved for his ALPRAZolam Duanne Moron) 1 MG tablet for 1 year.

## 2015-09-25 ENCOUNTER — Ambulatory Visit (INDEPENDENT_AMBULATORY_CARE_PROVIDER_SITE_OTHER): Payer: Medicare Other | Admitting: Neurology

## 2015-09-25 ENCOUNTER — Encounter: Payer: Self-pay | Admitting: Neurology

## 2015-09-25 ENCOUNTER — Other Ambulatory Visit: Payer: Self-pay | Admitting: *Deleted

## 2015-09-25 VITALS — BP 167/101 | HR 74 | Ht 70.0 in | Wt 260.5 lb

## 2015-09-25 DIAGNOSIS — G6289 Other specified polyneuropathies: Secondary | ICD-10-CM | POA: Diagnosis not present

## 2015-09-25 DIAGNOSIS — E1140 Type 2 diabetes mellitus with diabetic neuropathy, unspecified: Secondary | ICD-10-CM | POA: Diagnosis not present

## 2015-09-25 DIAGNOSIS — G629 Polyneuropathy, unspecified: Secondary | ICD-10-CM | POA: Insufficient documentation

## 2015-09-25 MED ORDER — L-METHYLFOLATE-B6-B12 3-35-2 MG PO TABS: 1.0000 | ORAL_TABLET | Freq: Two times a day (BID) | ORAL | Status: DC

## 2015-09-25 MED ORDER — PREGABALIN 100 MG PO CAPS: 100.0000 mg | ORAL_CAPSULE | Freq: Four times a day (QID) | ORAL | Status: DC

## 2015-09-25 NOTE — Progress Notes (Signed)
Chief Complaint  Patient presents with  . Peripheral Neuropathy    Reports his pain, burning and tingling is worse when he is on his feet for prolonged periods of time or when he drives for long distances.  His A1C last month was 6.4.     [facility]  PATIENT: Douglas [name] DOB: 09/23/1945    HISTORY OF PRESENT ILLNESS: Douglas [name] 70 year old male returns for followup. He has a history of bilateral feet and hand paresthesias. He also has a history of diabetes since 2000, follow-up for diabetic peripheral neuropathy He has gout, obesity, obstructive sleep apnea but could not tolerate CPA, and diabetes for since 2000. He has no distal leg weakness, history of low back pain and occasional right lower extremity shooting pain. EMG nerve conduction showed evidence of length dependent mild axonal peripheral neuropathy. There is no evidence of right lumbosacral radiculopathy. Patient has had side effects to gabapentin in the past which cause swelling and rash. He did not find the cream to be beneficial.Laboratory evaluation in October 2013 showed normal TSH, B12, ESR, C-reactive protein, protein electrophoresis. He was taking Lyrica 185m 3 times a day, but complains of unbearable bilateral feet and fingertips paresthesia, I have added on Trileptal 150 mg twice a day since his previous visit in June 2015, he did not notice any significant benefit, He continued to be active, driving his grandchildren each day, also complains of chronic low back pain, previously had epidural injection, has been very helpful, Most recent A1c was 6.0.  UPDATE September 25 2015: Last visit was with Douglas [name] December 2016, he continue complains significant bilateral feet and fingertips paresthesia, He is currently on Cymbalta by his primary care, and Lyrica 100 mg 3 times a day, which has been helpful, he denies significant side effect from combination therapy, in addition he was started on Metanx he  noticed definite benefit, most recent A1c 6.4.  He noticed worsening bilateral feet  plantar surface and toe pain after bearing weight, he also noticed bilateral hands paresthesia, he is hands involvement was before the feet in 2012, We have reviewed laboratory evaluations, there was increased Kappa Light chain in February 2017, he was evaluated by oncologist, was deemed due to his chronic renal disease,  REVIEW OF SYSTEMS: [name] 14 system review of systems performed and notable only for those listed, all others are neg:     ALLERGIES: Allergies  Allergen Reactions  . Atenolol   . Hctz [Hydrochlorothiazide]   . Bee Venom Swelling  . Benadryl [Diphenhydramine Hcl (Sleep)]     Hyperactivity   . Enalapril     itching  . Gabapentin     rash    HOME MEDICATIONS: Outpatient Prescriptions Prior to Visit  Medication Sig Dispense Refill  . allopurinol (ZYLOPRIM) 300 MG tablet TAKE 1 TABLET BY MOUTH ONCE A DAY 90 tablet 3  . ALPRAZolam (XANAX) 1 MG tablet TAKE 1/2 TO 1 TABLET BY MOUTH 3 TIMES A DAY AS NEEDED 90 tablet 3  . aspirin 81 MG tablet Take 81 mg by mouth daily.    . Blood Glucose Monitoring Suppl (FREESTYLE FREEDOM LITE) W/DEVICE KIT Use as directed 1 each 0  . cetirizine (ZYRTEC) 10 MG tablet Take 10 mg by mouth daily. Reported on 08/22/2015    . colchicine 0.6 MG tablet Take 1 tablet (0.6 mg total) by mouth 2 (two) times daily. 60 tablet 5  . doxazosin (CARDURA) 8 MG tablet Take 1 tablet (8 mg total) by mouth  at bedtime. 90 tablet 2  . DULoxetine (CYMBALTA) 60 MG capsule Take 1 capsule (60 mg total) by mouth daily. 30 capsule 11  . esomeprazole (NEXIUM) 40 MG capsule Take 1 capsule (40 mg total) by mouth daily at 12 noon. 90 capsule 3  . furosemide (LASIX) 40 MG tablet Take 1 tablet (40 mg total) by mouth daily. 90 tablet 3  . glimepiride (AMARYL) 2 MG tablet Take 1 tablet (2 mg total) by mouth 2 (two) times daily. 90 tablet 0  . glucose blood (FREESTYLE LITE) test strip Use as  directed to check blood sugar twice daily.  Dx: E11.29 200 each 3  . HYDROcodone-acetaminophen (NORCO/VICODIN) 5-325 MG tablet Take 1 tablet by mouth 3 (three) times daily as needed. 60 tablet 0  . levothyroxine (SYNTHROID, LEVOTHROID) 100 MCG tablet TAKE 1 TABLET BY MOUTH EVERY DAY EXCEPT MONDAY AND WEDNESDAY, TAKE 2 TABLETS ON MONDAY AND WEDNESDAY 90 tablet 3  . Magnesium 500 MG TABS Take by mouth.    . multivitamin (METANX) 3-35-2 MG TABS tablet TAKE ONE TABLET BY MOUTH TWO TIMES DAILY 180 tablet 0  . Olopatadine HCl (PATADAY) 0.2 % SOLN Apply 1 drop to eye daily. 2.5 mL 3  . pregabalin (LYRICA) 100 MG capsule Take 1 capsule (100 mg total) by mouth 3 (three) times daily. 90 capsule 5  . simvastatin (ZOCOR) 40 MG tablet Take 1 tablet (40 mg total) by mouth at bedtime. 90 tablet 3  . Testosterone Propionate 2 % CREA Place 1 mL onto the skin daily.    Marland Kitchen triamcinolone cream (KENALOG) 0.1 % Reported on 08/22/2015    . trimethoprim-polymyxin b (POLYTRIM) ophthalmic solution Place 1 drop into both eyes every 4 (four) hours. 10 mL 0  . vitamin B-12 (CYANOCOBALAMIN) 1000 MCG tablet Take 500 mcg by mouth daily.     . Vitamins A & D (VITAMIN A & D) 8000-400 UNITS CAPS Take by mouth.     No facility-administered medications prior to visit.    PAST MEDICAL HISTORY: Past Medical History  Diagnosis Date  . Neuropathy (HCC)     diabetes  . Hypertension   . Diabetes mellitus without complication ([place])   . Gout   . Depression   . OSA (obstructive sleep apnea)     supplemental oxygen at night  . Syncope and collapse   . Kidney stones     Dr. Rogers Blocker  . S/P cardiac cath 1998    [facility]  . Kidney cysts   . Temporary low platelet count ([place])     PAST SURGICAL HISTORY: Past Surgical History  Procedure Laterality Date  . Knee surgery      arthroscopy  . Back surgery      rupture disc lumbar spine  . Shoulder surgery Bilateral     arthroscopic right, rotator cuff repair left    FAMILY  HISTORY: Family History  Problem Relation Age of Onset  . Breast cancer Mother   . Lung cancer Mother   . Bone cancer Mother   . Heart Problems Mother   . Cancer Mother     breast, lung and rib  . AAA (abdominal aortic aneurysm) Mother   . Heart disease Mother   . Heart attack Father   . Heart disease Father   . Heart attack Brother   . Cancer Brother     esophageal  . Colon cancer Neg Hx     SOCIAL HISTORY: Social History   Social History  . Marital Status: Married  Spouse Name: [name]  . Number of Children: 3  . Years of Education: 13   Occupational History  .      retired   Social History Main Topics  . Smoking status: Never Smoker   . Smokeless tobacco: Never Used  . Alcohol Use: No  . Drug Use: No  . Sexual Activity: Yes   Other Topics Concern  . Not on file   Social History Narrative   Lives in [place] with his wife ([name]). No children. Three step children. No pets.      Work - Patient is retired. Maintenance supervisor.      School - One year college education.      Right handed.      [place], served in [place], no combat                 PHYSICAL EXAM  Filed Vitals:   09/25/15 1455  BP: 167/101  Pulse: 74  Height: 5' 10"  (1.778 m)  Weight: 260 lb 8 oz (118.162 kg)   Body mass index is 37.38 kg/(m^2). Generalized: Well developed, obese male in no acute distress  Head: normocephalic and atraumatic,. Oropharynx benign  Neck: Supple, no carotid bruits  Cardiac: Regular rate rhythm, no murmur    Neurological examination   Mentation: Alert oriented to time, place, history taking. Follows all commands speech and language fluent  Cranial nerve II-XII: Pupils were equal round reactive to light extraocular movements were [name], visual field were [name] on confrontational test. Facial sensation and strength were normal. hearing was intact to finger rubbing bilaterally. Uvula tongue midline. head turning and shoulder  shrug and were normal and symmetric.Tongue protrusion into cheek strength was normal. Motor: normal bulk and tone, [name] strength in the BUE, BLE, fine finger movements normal, no pronator drift. No focal weakness Sensory: Length dependent decreased light touch decreased pinprick to mid shin bilaterally decreased vibratory to ankles proprioception normal  Coordination: finger-nose-finger, heel-to-shin bilaterally, no dysmetria Reflexes: Brachioradialis 2/2, biceps 2/2, triceps 2/2, patellar 2/2, Achilles absent, plantar responses were flexor bilaterally. Gait and Station: Rising up from seated position without assistance, normal stance, moderate stride, good arm swing, smooth turning,   DIAGNOSTIC DATA (LABS, IMAGING, TESTING) - I reviewed patient records, labs, notes, testing and imaging myself where available.  Lab Results  Component Value Date   WBC 8.0 06/07/2015   HGB 13.6 06/07/2015   HCT 41.2 06/07/2015   MCV 86.3 06/07/2015   PLT 190 06/07/2015      Component Value Date/Time   NA 140 08/30/2015 1035   NA 142 12/04/2012 0414   K 3.8 08/30/2015 1035   K 4.4 12/04/2012 0414   CL 100 08/30/2015 1035   CL 110* 12/04/2012 0414   CO2 33* 08/30/2015 1035   CO2 26 12/04/2012 0414   GLUCOSE 120* 08/30/2015 1035   GLUCOSE 116* 12/04/2012 0414   BUN 17 08/30/2015 1035   BUN 32* 12/04/2012 0414   CREATININE 1.50 08/30/2015 1035   CREATININE 2.33* 12/04/2012 0414   CALCIUM 9.6 08/30/2015 1035   CALCIUM 9.1 12/04/2012 0414   PROT 7.0 08/30/2015 1035   PROT 6.8 12/02/2012 2139   ALBUMIN 4.2 08/30/2015 1035   ALBUMIN 3.7 12/02/2012 2139   AST 19 08/30/2015 1035   AST 81* 12/02/2012 2139   ALT 15 08/30/2015 1035   ALT 74 12/02/2012 2139   ALKPHOS 75 08/30/2015 1035   ALKPHOS 64 12/02/2012 2139   BILITOT 0.9 08/30/2015 1035  BILITOT 0.5 12/02/2012 2139   GFRNONAA 51* 06/07/2015 1517   GFRNONAA 28* 12/04/2012 0414   GFRAA 59* 06/07/2015 1517   GFRAA 32* 12/04/2012 0414    Lab Results  Component Value Date   CHOL 130 08/30/2015   HDL 38.60* 08/30/2015   LDLCALC 64 08/30/2015   TRIG 137.0 08/30/2015   CHOLHDL 3 08/30/2015   Lab Results  Component Value Date   HGBA1C 6.4 08/30/2015    Lab Results  Component Value Date   TSH 1.71 11/24/2014      ASSESSMENT AND PLAN  70 y.o. year old male  Peripheral neuropathy,  Contributing factors are diabetes,  But the atypical features is symptoms started from his hands, even before his feet involvement, laboratory evaluation in February 2017 showed Kappa light chain, he was evaluated by oncologist, consider abnormal laboratories due to his chronic kidney function,  Continue symptomatic treatment, increase Lyrica to 100 mg 4 times a day, New prescription was provided,  Continue Metanx, Cymbalta 40m daily YMarcial Pacas M.D. Ph.D.  [facility] [address] [place] [address]Phone: [phone]:      [phone]

## 2015-10-29 ENCOUNTER — Other Ambulatory Visit: Payer: Self-pay

## 2015-10-29 DIAGNOSIS — Z760 Encounter for issue of repeat prescription: Secondary | ICD-10-CM

## 2015-10-29 MED ORDER — GLUCOSE BLOOD VI STRP: ORAL_STRIP | Status: DC

## 2015-10-30 DIAGNOSIS — R600 Localized edema: Secondary | ICD-10-CM | POA: Diagnosis not present

## 2015-10-30 DIAGNOSIS — I10 Essential (primary) hypertension: Secondary | ICD-10-CM | POA: Diagnosis not present

## 2015-10-30 DIAGNOSIS — N183 Chronic kidney disease, stage 3 (moderate): Secondary | ICD-10-CM | POA: Diagnosis not present

## 2015-10-30 DIAGNOSIS — N2581 Secondary hyperparathyroidism of renal origin: Secondary | ICD-10-CM | POA: Diagnosis not present

## 2015-11-06 ENCOUNTER — Other Ambulatory Visit: Payer: Self-pay

## 2015-11-10 ENCOUNTER — Other Ambulatory Visit: Payer: Self-pay | Admitting: Internal Medicine

## 2015-11-13 DIAGNOSIS — D631 Anemia in chronic kidney disease: Secondary | ICD-10-CM | POA: Diagnosis not present

## 2015-11-13 DIAGNOSIS — N179 Acute kidney failure, unspecified: Secondary | ICD-10-CM | POA: Diagnosis not present

## 2015-11-13 DIAGNOSIS — N183 Chronic kidney disease, stage 3 (moderate): Secondary | ICD-10-CM | POA: Diagnosis not present

## 2015-11-13 DIAGNOSIS — N2581 Secondary hyperparathyroidism of renal origin: Secondary | ICD-10-CM | POA: Diagnosis not present

## 2015-11-13 DIAGNOSIS — I10 Essential (primary) hypertension: Secondary | ICD-10-CM | POA: Diagnosis not present

## 2015-11-15 ENCOUNTER — Encounter: Payer: Self-pay | Admitting: Internal Medicine

## 2015-11-15 ENCOUNTER — Ambulatory Visit (INDEPENDENT_AMBULATORY_CARE_PROVIDER_SITE_OTHER): Payer: Medicare Other | Admitting: Internal Medicine

## 2015-11-15 VITALS — BP 144/80 | HR 86 | Temp 98.5°F | Ht 70.0 in | Wt 270.6 lb

## 2015-11-15 DIAGNOSIS — N183 Chronic kidney disease, stage 3 unspecified: Secondary | ICD-10-CM

## 2015-11-15 DIAGNOSIS — E118 Type 2 diabetes mellitus with unspecified complications: Secondary | ICD-10-CM | POA: Diagnosis not present

## 2015-11-15 DIAGNOSIS — E039 Hypothyroidism, unspecified: Secondary | ICD-10-CM

## 2015-11-15 DIAGNOSIS — I10 Essential (primary) hypertension: Secondary | ICD-10-CM

## 2015-11-15 LAB — COMPREHENSIVE METABOLIC PANEL
ALT: 20 U/L (ref 0–53)
AST: 22 U/L (ref 0–37)
Albumin: 4 g/dL (ref 3.5–5.2)
Alkaline Phosphatase: 78 U/L (ref 39–117)
BUN: 16 mg/dL (ref 6–23)
CO2: 32 mEq/L (ref 19–32)
Calcium: 9.4 mg/dL (ref 8.4–10.5)
Chloride: 99 mEq/L (ref 96–112)
Creatinine, Ser: 1.33 mg/dL (ref 0.40–1.50)
GFR: 56.42 mL/min — ABNORMAL LOW (ref >60.00)
Glucose, Bld: 104 mg/dL — ABNORMAL HIGH (ref 70–99)
Potassium: 3.8 mEq/L (ref 3.5–5.1)
Sodium: 137 mEq/L (ref 135–145)
Total Bilirubin: 0.8 mg/dL (ref 0.2–1.2)
Total Protein: 7 g/dL (ref 6.0–8.3)

## 2015-11-15 LAB — TSH: TSH: 2.16 u[IU]/mL (ref 0.35–4.50)

## 2015-11-15 LAB — HEMOGLOBIN A1C: Hgb A1c MFr Bld: 6.3 % (ref 4.6–6.5)

## 2015-11-15 NOTE — Assessment & Plan Note (Signed)
Will check A1c with labs. Continue current medications. 

## 2015-11-15 NOTE — Progress Notes (Signed)
Subjective:    Patient ID: Douglas [name], male    DOB: 03/22/1946, 70 y.o.   MRN: [id number]  HPI  70YO male presents for follow up.  DM - BG all under 160 fasting. Compliant with medication.  Lab Results  Component Value Date   HGBA1C 6.4 08/30/2015   Recent sinus sinus congestion. Started last Thursday. Using OTC mucous relief. Some improvement with this. Possible fever on Monday. No cough, dyspnea.  Recently had colonoscopy. This went well.   HTN - BP was slightly elevated. Started on Losartan by nephrologist.  Wt Readings from Last 3 Encounters:  11/15/15 270 lb 9.6 oz (122.7 kg)  09/25/15 260 lb 8 oz (118.2 kg)  08/30/15 264 lb 6.4 oz (119.9 kg)   BP Readings from Last 3 Encounters:  11/15/15 (!) 144/80  09/25/15 (!) 167/101  08/30/15 140/90    Past Medical History:  Diagnosis Date  . Depression   . Diabetes mellitus without complication ([place])   . Gout   . Hypertension   . Kidney cysts   . Kidney stones    Dr. Rogers Blocker  . Neuropathy (HCC)    diabetes  . OSA (obstructive sleep apnea)    supplemental oxygen at night  . S/P cardiac cath 1998   [facility]  . Syncope and collapse   . Temporary low platelet count (HCC)    Family History  Problem Relation Age of Onset  . Breast cancer Mother   . Lung cancer Mother   . Bone cancer Mother   . Heart Problems Mother   . Cancer Mother     breast, lung and rib  . AAA (abdominal aortic aneurysm) Mother   . Heart disease Mother   . Heart attack Father   . Heart disease Father   . Heart attack Brother   . Cancer Brother     esophageal  . Colon cancer Neg Hx    Past Surgical History:  Procedure Laterality Date  . BACK SURGERY     rupture disc lumbar spine  . KNEE SURGERY     arthroscopy  . SHOULDER SURGERY Bilateral    arthroscopic right, rotator cuff repair left   Social History   Social History  . Marital status: Married    Spouse name: [name]  . Number of children: 3  . Years of education: 64     Occupational History  .      retired   Social History Main Topics  . Smoking status: Never Smoker  . Smokeless tobacco: Never Used  . Alcohol use No  . Drug use: No  . Sexual activity: Yes   Other Topics Concern  . None   Social History Narrative   Lives in [place] with his wife ([name]). No children. Three step children. No pets.      Work - Patient is retired. Maintenance supervisor.      School - One year college education.      Right handed.      [place], served in [place], no combat                Review of Systems  Constitutional: Negative for activity change, appetite change, chills, fatigue, fever and unexpected weight change.  HENT: Positive for congestion, postnasal drip, rhinorrhea and sinus pressure. Negative for ear pain, sore throat, tinnitus, trouble swallowing and voice change.   Eyes: Negative for visual disturbance.  Respiratory: Negative for cough, chest tightness and shortness of breath.  Cardiovascular: Negative for chest pain, palpitations and leg swelling.  Gastrointestinal: Negative for abdominal distention, abdominal pain, constipation, diarrhea and nausea.  Genitourinary: Negative for difficulty urinating, dysuria and urgency.  Musculoskeletal: Negative for arthralgias and gait problem.  Skin: Negative for color change and rash.  Hematological: Negative for adenopathy.  Psychiatric/Behavioral: Negative for dysphoric mood and sleep disturbance. The patient is not nervous/anxious.        Objective:    BP (!) 144/80 (BP Location: Right Arm, Patient Position: Sitting, Cuff Size: Large)   Pulse 86   Temp 98.5 F (36.9 C) (Oral)   Ht 5\' 10"  (1.778 m)   Wt 270 lb 9.6 oz (122.7 kg)   SpO2 95%   BMI 38.83 kg/m  Physical Exam  Constitutional: He is oriented to person, place, and time. He appears well-developed and well-nourished. No distress.  HENT:  Head: Normocephalic and atraumatic.  Right Ear: External ear  normal.  Left Ear: External ear normal.  Nose: Mucosal edema present.  Mouth/Throat: Oropharynx is clear and moist. No oropharyngeal exudate.  Eyes: Conjunctivae and EOM are normal. Pupils are equal, round, and reactive to light. Right eye exhibits no discharge. Left eye exhibits no discharge. No scleral icterus.  Neck: Normal range of motion. Neck supple. No tracheal deviation present. No thyromegaly present.  Cardiovascular: Normal rate, regular rhythm and normal heart sounds.  Exam reveals no gallop and no friction rub.   No murmur heard. Pulmonary/Chest: Effort normal and breath sounds normal. No accessory muscle usage. No tachypnea. No respiratory distress. He has no decreased breath sounds. He has no wheezes. He has no rhonchi. He has no rales. He exhibits no tenderness.  Musculoskeletal: Normal range of motion. He exhibits no edema.  Lymphadenopathy:    He has no cervical adenopathy.  Neurological: He is alert and oriented to person, place, and time. No cranial nerve deficit. Coordination normal.  Skin: Skin is warm and dry. No rash noted. He is not diaphoretic. No erythema. No pallor.  Psychiatric: He has a normal mood and affect. His behavior is normal. Judgment and thought content normal.          Assessment & Plan:   Problem List Items Addressed This Visit      Unprioritized   Chronic kidney disease     Will check renal function with labs today.      Controlled diabetes mellitus type 2 with complications ([place]) - Primary    Will check A1c with labs.  Continue current medications.      Relevant Medications   losartan (COZAAR) 25 MG tablet   Other Relevant Orders   Comprehensive metabolic panel   Hemoglobin A1c   Essential hypertension    BP Readings from Last 3 Encounters:  11/15/15 (!) 144/80  09/25/15 (!) 167/101  08/30/15 140/90   BP improved on Losartan. Renal function with labs.      Relevant Medications   losartan (COZAAR) 25 MG tablet   Hypothyroidism  (Chronic)    Will check TSH with labs. Continue Levothyroxine.      Relevant Orders   TSH    Other Visit Diagnoses   None.      Return in about 3 months (around 02/15/2016) for New Patient.  Ronette Deter, MD Internal [facility]

## 2015-11-15 NOTE — Progress Notes (Signed)
Pre visit review using our clinic review tool, if applicable. No additional management support is needed unless otherwise documented below in the visit note. 

## 2015-11-15 NOTE — Assessment & Plan Note (Addendum)
Will check TSH with labs. Continue Levothyroxine. 

## 2015-11-15 NOTE — Assessment & Plan Note (Addendum)
Will check renal function with labs today. 

## 2015-11-15 NOTE — Assessment & Plan Note (Signed)
BP Readings from Last 3 Encounters:  11/15/15 (!) 144/80  09/25/15 (!) 167/101  08/30/15 140/90   BP improved on Losartan. Renal function with labs.

## 2015-11-15 NOTE — Patient Instructions (Signed)
Labs today.   Follow up in 3 months.  

## 2015-11-27 ENCOUNTER — Other Ambulatory Visit: Payer: Self-pay

## 2015-11-27 MED ORDER — ALLOPURINOL 300 MG PO TABS: 300.0000 mg | ORAL_TABLET | Freq: Every day | ORAL | 3 refills | Status: DC

## 2015-12-06 ENCOUNTER — Ambulatory Visit: Payer: Medicare Other | Admitting: Internal Medicine

## 2015-12-24 ENCOUNTER — Other Ambulatory Visit: Payer: Self-pay | Admitting: Family Medicine

## 2015-12-24 MED ORDER — GLIMEPIRIDE 2 MG PO TABS: ORAL_TABLET | ORAL | 1 refills | Status: DC

## 2015-12-24 NOTE — Telephone Encounter (Signed)
Following up with Dr.Cook and he gave verbal okay to refill.

## 2016-01-16 DIAGNOSIS — N5201 Erectile dysfunction due to arterial insufficiency: Secondary | ICD-10-CM | POA: Diagnosis not present

## 2016-01-16 DIAGNOSIS — N401 Enlarged prostate with lower urinary tract symptoms: Secondary | ICD-10-CM | POA: Diagnosis not present

## 2016-01-16 DIAGNOSIS — Z79899 Other long term (current) drug therapy: Secondary | ICD-10-CM | POA: Diagnosis not present

## 2016-01-16 DIAGNOSIS — R350 Frequency of micturition: Secondary | ICD-10-CM | POA: Diagnosis not present

## 2016-01-16 DIAGNOSIS — E291 Testicular hypofunction: Secondary | ICD-10-CM | POA: Diagnosis not present

## 2016-01-29 DIAGNOSIS — R600 Localized edema: Secondary | ICD-10-CM | POA: Diagnosis not present

## 2016-01-29 DIAGNOSIS — N183 Chronic kidney disease, stage 3 (moderate): Secondary | ICD-10-CM | POA: Diagnosis not present

## 2016-01-29 DIAGNOSIS — I10 Essential (primary) hypertension: Secondary | ICD-10-CM | POA: Diagnosis not present

## 2016-01-29 DIAGNOSIS — N2581 Secondary hyperparathyroidism of renal origin: Secondary | ICD-10-CM | POA: Diagnosis not present

## 2016-02-04 ENCOUNTER — Encounter: Payer: Medicare Other | Attending: Urology | Admitting: Dietician

## 2016-02-04 ENCOUNTER — Encounter: Payer: Self-pay | Admitting: Dietician

## 2016-02-04 VITALS — Ht 71.0 in | Wt 286.0 lb

## 2016-02-04 DIAGNOSIS — E669 Obesity, unspecified: Secondary | ICD-10-CM | POA: Insufficient documentation

## 2016-02-04 DIAGNOSIS — Z713 Dietary counseling and surveillance: Secondary | ICD-10-CM | POA: Insufficient documentation

## 2016-02-04 DIAGNOSIS — E119 Type 2 diabetes mellitus without complications: Secondary | ICD-10-CM | POA: Insufficient documentation

## 2016-02-04 DIAGNOSIS — E1142 Type 2 diabetes mellitus with diabetic polyneuropathy: Secondary | ICD-10-CM

## 2016-02-04 NOTE — Progress Notes (Signed)
Medical Nutrition Therapy: Visit start time: 4742  end time: 5956  Assessment:  Diagnosis: Type 2 diabetes Past medical history: hypertension, gout, sleep apnea, GI reflux, neuropathy, kidney disease Psychosocial issues/ stress concerns: Patient describes his stress as moderate and indicates "ok" as to how well he is dealing with his stress. Preferred learning method:  . Hands-on  Current weight: 286 lbs  Height: 71 in Medications, supplements: see list Progress and evaluation:  Patient in for initial medical nutrition therapy visit. He reports he checks his glucose each morning and all results are less than 160.  He expresses an interest in weight loss. He reports that he has been eating less bread, sweets and potatoes. He presently does not have a consistent meal pattern and eats snack type foods verses meals. His overall diet is low in fruits, vegetables and calcium sources.  Physical activity: He tries to walk for exercise but is limited due to neuropathy causing a burning sensation in his feet.  Dietary Intake:  Usual eating pattern includes snacks throughout day Dining out frequency: 5 meals per month Arises: 11:00am Breakfast: 12:00pm- bran flakes, skim milk Snack: 1:00pm- 1/2 cup+ peanuts, water Lunch: 2:00pm- yogurt or peanuts or ritz crackers (1/2-2/3 sleeve), or large bag of popcorn, unsweetened tea Snack:3:30-4:00pm 1 cup pintos with onions Supper: 6:00pm- large salad with lettuce, tomatoes, cucumbers, garlic and onion powder with olive oil Snack: ritz crackers and cheese or peanuts Beverages: water with tea flavoring  Nutrition Care Education: Diabetes/Weight control:   Instructed on a meal plan based on 1900 calories to promote weight loss and including diabetes dietary guidelines. Instruction included carbohydrate counting and how to better balance protein, carbohydrate and non-starchy vegetables. Also, reviewed label reading. Used food models and food guide plate to show  examples of meals based on food preferences. Kidney Disease: Reviewed recommendations for sodium. Also reviewed list of high phosphorus foods to limit. Fruit/statin interaction: Instructed to avoid grapefruit/juice while taking simvastatin.  Nutritional Diagnosis:  [place]-3.3 Overweight/obesity As related to inconsistent meal pattern resulting in excessive high calorie snacks and lack of physical activity..  As evidenced by diet/exercise history.  Intervention:  Establish a more consistent pattern of 3 meals spaced 4-5 hours apart and 1-2 snacks. Balance meals with protein, 2-4 servings carbohydrate (starch, fruit, yogurt) and free vegetables. Try to add more non-starchy vegetables. Read labels for carbohydrate. Can subtract fiber.  Include 6-7 oz. of protein food daily to meet protein needs. Read labels for sodium. Try to keep meals less than 600 mg per day with no more than 2000 mg per day.  Education Materials given:  . Plate Planner . Food lists/ Planning A Balanced Meal . Sample meal pattern/ menus . Goals/ instructions Learner/ who was taught:  . Patient  Level of understanding: . Partial understanding; needs review/ practice Learning barriers: . None Willingness to learn/ readiness for change: . Eager, change in progress Monitoring and Evaluation:  Dietary intake, exercise, , and body weight      follow up: 03/03/16 at 1330

## 2016-02-04 NOTE — Patient Instructions (Signed)
Establish a more consistent pattern of 3 meals spaced 4-5 hours apart and 1-2 snacks. Balance meals with protein, 2-4 servings carbohydrate (starch, fruit, yogurt) and free vegetables. Try to add more non-starchy vegetables. Read labels for carbohydrate. Can subtract fiber.  Include 6-7 oz. of protein food daily to meet protein needs. Read labels for sodium. Try to keep meals less than 600 mg per day with no more than 2000 mg per day.

## 2016-02-15 ENCOUNTER — Ambulatory Visit (INDEPENDENT_AMBULATORY_CARE_PROVIDER_SITE_OTHER): Payer: Medicare Other | Admitting: Family Medicine

## 2016-02-15 ENCOUNTER — Encounter: Payer: Self-pay | Admitting: Family Medicine

## 2016-02-15 VITALS — BP 142/86 | HR 80 | Temp 98.5°F | Resp 16 | Wt 283.1 lb

## 2016-02-15 DIAGNOSIS — E118 Type 2 diabetes mellitus with unspecified complications: Secondary | ICD-10-CM | POA: Diagnosis not present

## 2016-02-15 DIAGNOSIS — Z23 Encounter for immunization: Secondary | ICD-10-CM

## 2016-02-15 DIAGNOSIS — G6289 Other specified polyneuropathies: Secondary | ICD-10-CM

## 2016-02-15 DIAGNOSIS — I10 Essential (primary) hypertension: Secondary | ICD-10-CM | POA: Diagnosis not present

## 2016-02-15 NOTE — Assessment & Plan Note (Signed)
Stable. Continue losartan, Lasix, Cardura.

## 2016-02-15 NOTE — Progress Notes (Signed)
Subjective:  Patient ID: Douglas [name], male    DOB: [birth year]  Age: 70 y.o. MRN: [id number]  CC: Follow up  HPI:  70 year old male with a past medical history of hypertension, and DM 2 with complications, CKD, gout, hypothyroidism presents for follow up and to establish with me as his PCP.  HTN  Stable on Losartan.  Also takes Lasix and Cardura.  DM-2  Well controlled.  CBG's - Mostly in the low 100's.  Doing well on Amaryl. Working with lifestyle at the hospital.  Diabetic peripheral neuropathy  Severe.  Is managed okay with Cymbalta and Lyrica.  Patient often has difficulty affording Lyrica.  He has tried gabapentin in the past but has had an adverse reaction.  This continues to be troublesome for him. He requires narcotics at times.  Social Hx   Social History   Social History  . Marital status: Married    Spouse name: [name]  . Number of children: 3  . Years of education: 89   Occupational History  .      retired   Social History Main Topics  . Smoking status: Never Smoker  . Smokeless tobacco: Never Used  . Alcohol use No  . Drug use: No  . Sexual activity: Yes   Other Topics Concern  . None   Social History Narrative   Lives in [place] with his wife ([name]). No children. Three step children. No pets.      Work - Patient is retired. Maintenance supervisor.      School - One year college education.      Right handed.      [place], served in [place], no combat                Review of Systems  Constitutional: Negative.   Musculoskeletal:       LE pain, numbness/tingling.   Objective:  BP (!) 142/86 (BP Location: Right Arm, Patient Position: Sitting, Cuff Size: Large)   Pulse 80   Temp 98.5 F (36.9 C) (Oral)   Resp 16   Wt 283 lb 2 oz (128.4 kg)   SpO2 95%   BMI 39.49 kg/m   BP/Weight 02/15/2016 91/50/5697 12/17/8014  Systolic BP 553 - 748  Diastolic BP 86 - 80  Wt. (Lbs) 283.13 286 270.6    BMI 39.49 39.89 38.83   Physical Exam  Constitutional: He is oriented to person, place, and time. He appears well-developed. No distress.  Cardiovascular: Normal rate and regular rhythm.   Pulmonary/Chest: Effort normal and breath sounds normal.  Neurological: He is alert and oriented to person, place, and time.  Psychiatric: He has a normal mood and affect.  Vitals reviewed.  Lab Results  Component Value Date   WBC 8.0 06/07/2015   HGB 13.6 06/07/2015   HCT 41.2 06/07/2015   PLT 190 06/07/2015   GLUCOSE 104 (H) 11/15/2015   CHOL 130 08/30/2015   TRIG 137.0 08/30/2015   HDL 38.60 (L) 08/30/2015   LDLCALC 64 08/30/2015   ALT 20 11/15/2015   AST 22 11/15/2015   NA 137 11/15/2015   K 3.8 11/15/2015   CL 99 11/15/2015   CREATININE 1.33 11/15/2015   BUN 16 11/15/2015   CO2 32 11/15/2015   TSH 2.16 11/15/2015   PSA 0.81 01/27/2015   HGBA1C 6.3 11/15/2015   MICROALBUR <0.7 08/30/2015    Assessment & Plan:   Problem List Items Addressed This Visit    Peripheral neuropathy ([place])  Severe. Fair control. Discussed trial of TCA. Patient elected to continue Lyrica. We'll attempt to get a drug rep to come to the office for samples to help patient as he is in the donut hole.      Essential hypertension - Primary    Stable. Continue losartan, Lasix, Cardura.      Controlled diabetes mellitus type 2 with complications ([place])    Well controlled. Continue Amaryl and lifestyle modification.       Other Visit Diagnoses    Encounter for immunization       Relevant Orders   Flu vaccine HIGH DOSE PF (Completed)     Follow-up: Return in about 3 months (around 05/17/2016) for Follow up Chronic medical issues.  [place]

## 2016-02-15 NOTE — Assessment & Plan Note (Deleted)
Severe. Fair control. Discussed trial of TCA. Patient elected to continue Lyrica. We'll attempt to get a drug rep to come to the office for samples to help patient as he is in the donut hole.

## 2016-02-15 NOTE — Assessment & Plan Note (Deleted)
Well controlled. Continue Amaryl and lifestyle modification.

## 2016-02-15 NOTE — Patient Instructions (Signed)
Continue your meds.  I'm going to try to get samples for lyrica.  Follow up in 3 months.  Take care  Dr. Lacinda Axon

## 2016-02-15 NOTE — Assessment & Plan Note (Signed)
Well controlled. Continue Amaryl and lifestyle modification.

## 2016-02-15 NOTE — Assessment & Plan Note (Signed)
Severe. Fair control. Discussed trial of TCA. Patient elected to continue Lyrica. We'll attempt to get a drug rep to come to the office for samples to help patient as he is in the donut hole.

## 2016-02-15 NOTE — Progress Notes (Signed)
Pre visit review using our clinic review tool, if applicable. No additional management support is needed unless otherwise documented below in the visit note. 

## 2016-03-03 ENCOUNTER — Encounter: Payer: Medicare Other | Attending: Urology | Admitting: Dietician

## 2016-03-03 ENCOUNTER — Encounter: Payer: Self-pay | Admitting: Dietician

## 2016-03-03 VITALS — Ht 71.0 in | Wt 282.8 lb

## 2016-03-03 DIAGNOSIS — E1140 Type 2 diabetes mellitus with diabetic neuropathy, unspecified: Secondary | ICD-10-CM

## 2016-03-03 DIAGNOSIS — E119 Type 2 diabetes mellitus without complications: Secondary | ICD-10-CM | POA: Diagnosis not present

## 2016-03-03 DIAGNOSIS — Z713 Dietary counseling and surveillance: Secondary | ICD-10-CM | POA: Insufficient documentation

## 2016-03-03 NOTE — Progress Notes (Signed)
Medical Nutrition Therapy Follow-up visit:  Time with patient: 1345-1430  Visit #:2 ASSESSMENT:  Diagnosis:Type 2 diabetes  Current weight:282.8 lbs   Height:71 in Medications: See list Medical History: hypertension, gout, GI reflux, neuropathy, kidney disease Progress and evaluation: Patient in for medical nutrition therapy follow-up.  He continues to check his blood glucose daily and reports most FBG's are in the 130's. He continues to eat 6 small meals daily often eating snack type foods verses meals. He reports that he has craved more sweets lately and has been eating oatmeal cream cookies on a daily basis.His diet continues to be low in fruits, vegetables, fiber and calcium sources. He states he is eating larger portions in the evening because he is not eating balanced meals during the day.  Physical activity:none  NUTRITION CARE EDUCATION: Diabetes/Weight management:  Encouraged again to establish a more consistent pattern of eating so that meals include a protein source, starch and non-starchy vegetables. Use food guide plate to show examples based on his food preferences. Discussed how spreading meals throughout the day can decrease tendency to eat more in the evenings. Discussed strategies such as drinking hot tea in the evenings to help prevent eating several snacks.  INTERVENTION:  Include only 1 snack after dinner that includes range of 15 -30 gms of carbohydrate. Drink hot teas in the afternoon and evening to help decrease snacking. Try [employer] organic pinto beans which has less sodium. Try apple and peanut butter for a snack. Measure out 1/4 cup of peanuts and limit to that amount as a snack. Add some raw vegetables for a snack. Ask for "take out" box at the beginning of the meal to help avoid over-eating. Continue to think about the food guide plate when planning a meal. Balance 2-4 oz. Protein, 2-4servings of carbohydrate (starch, fruit, milk) and non-starchy  vegetables. Can add a small serving of fruit to meals.  EDUCATION MATERIALS GIVEN:  . Goals/ instructions  LEARNER/ who was taught:  . Patient  LEVEL OF UNDERSTANDING: . Partial understanding; needs review/ practice LEARNING BARRIERS: . None  WILLINGNESS TO LEARN/READINESS FOR CHANGE: . Acceptance, ready for change  MONITORING AND EVALUATION:  Patient did not schedule a follow-up appointment at this time. Encouraged to call if desires further help with his diet/nutrition.

## 2016-03-03 NOTE — Patient Instructions (Signed)
Include only 1 snack after dinner that includes range of 15 -30 gms of carbohydrate. Drink hot teas in the afternoon and evening to help decrease snacking. Try [employer] organic pinto beans which has less sodium. Try apple and peanut butter for a snack. Measure out 1/4 cup of peanuts and limit to that amount as a snack. Add some raw vegetables for a snack. Ask for "take out" box at the beginning of the meal to help avoid over-eating. Continue to think about the food guide plate when planning a meal. Balance 2-4 oz. Protein, 2-4servings of carbohydrate (starch, fruit, milk) and non-starchy vegetables. Can add a small serving of fruit to meals.

## 2016-03-21 ENCOUNTER — Encounter: Payer: Self-pay | Admitting: Family Medicine

## 2016-03-21 ENCOUNTER — Ambulatory Visit (INDEPENDENT_AMBULATORY_CARE_PROVIDER_SITE_OTHER): Payer: Medicare Other | Admitting: Family Medicine

## 2016-03-21 DIAGNOSIS — H00011 Hordeolum externum right upper eyelid: Secondary | ICD-10-CM

## 2016-03-21 MED ORDER — POLYMYXIN B-TRIMETHOPRIM 10000-0.1 UNIT/ML-% OP SOLN: 1.0000 [drp] | Freq: Four times a day (QID) | OPHTHALMIC | 1 refills | Status: DC

## 2016-03-21 NOTE — Patient Instructions (Signed)
Use the drops as prescribed.  Warm compression and massage.  If fails to improve recommend seeing Ophthalmology for steroid drop.  Take care  Dr. Lacinda Axon

## 2016-03-21 NOTE — Progress Notes (Signed)
Pre visit review using our clinic review tool, if applicable. No additional management support is needed unless otherwise documented below in the visit note. 

## 2016-03-22 DIAGNOSIS — H00019 Hordeolum externum unspecified eye, unspecified eyelid: Secondary | ICD-10-CM | POA: Insufficient documentation

## 2016-03-22 NOTE — Assessment & Plan Note (Signed)
New acute problem. Exam consistent with stye. Treating with warm compresses and Polytrim. If fails to improve will need to see Opthalmology.

## 2016-03-22 NOTE — Progress Notes (Signed)
Subjective:  Patient ID: Douglas [name], male    DOB: 07-20-1945  Age: 70 y.o. MRN: [id number]  CC: R eye swelling, crusting  HPI:  70 year old male presents for an acute visit with the above complaints.  R eye swelling, crusting  X 3 days.  R upper eyelid lump and swelling.  Moderately painful.  Associated crusting.  No reports of conjunctival redness.  He has been using warm compresses and eye drops (prior antibiotic drop) with some improvement.  No known exacerbating factors.  No other complaints or concerns at this time.  Social Hx   Social History   Social History  . Marital status: Married    Spouse name: [name]  . Number of children: 3  . Years of education: 34   Occupational History  .      retired   Social History Main Topics  . Smoking status: Never Smoker  . Smokeless tobacco: Never Used  . Alcohol use No  . Drug use: No  . Sexual activity: Yes   Other Topics Concern  . None   Social History Narrative   Lives in [place] with his wife ([name]). No children. Three step children. No pets.      Work - Patient is retired. Maintenance supervisor.      School - One year college education.      Right handed.      [employer], served in [place], no combat                Review of Systems  Constitutional: Negative.   Eyes:       Eyelid swelling. Crusting.    Objective:  BP (!) 171/82 (BP Location: Left Arm, Patient Position: Sitting, Cuff Size: Large)   Pulse 80   Temp 97.7 F (36.5 C) (Oral)   Resp 14   Wt 281 lb 8 oz (127.7 kg)   SpO2 98%   BMI 39.26 kg/m   BP/Weight 03/21/2016 03/03/2016 63/04/4968  Systolic BP 263 - 785  Diastolic BP 82 - 86  Wt. (Lbs) 281.5 282.8 283.13  BMI 39.26 39.44 39.49    Physical Exam  Constitutional: He is oriented to person, place, and time. He appears well-developed. No distress.  Eyes:  R eye - upper eyelid swelling/redness. Crusting of the eyelashes noted. No  conjunctival injection.  Pulmonary/Chest: Effort normal.  Neurological: He is alert and oriented to person, place, and time.  Psychiatric: He has a normal mood and affect.  Vitals reviewed.   Lab Results  Component Value Date   WBC 8.0 06/07/2015   HGB 13.6 06/07/2015   HCT 41.2 06/07/2015   PLT 190 06/07/2015   GLUCOSE 104 (H) 11/15/2015   CHOL 130 08/30/2015   TRIG 137.0 08/30/2015   HDL 38.60 (L) 08/30/2015   LDLCALC 64 08/30/2015   ALT 20 11/15/2015   AST 22 11/15/2015   NA 137 11/15/2015   K 3.8 11/15/2015   CL 99 11/15/2015   CREATININE 1.33 11/15/2015   BUN 16 11/15/2015   CO2 32 11/15/2015   TSH 2.16 11/15/2015   PSA 0.81 01/27/2015   HGBA1C 6.3 11/15/2015   MICROALBUR <0.7 08/30/2015    Assessment & Plan:   Problem List Items Addressed This Visit    Hordeolum externum (stye)    New acute problem. Exam consistent with stye. Treating with warm compresses and Polytrim. If fails to improve will need to see Opthalmology.  Meds ordered this encounter  Medications  . trimethoprim-polymyxin b (POLYTRIM) ophthalmic solution    Sig: Place 1 drop into the right eye 4 (four) times daily.    Dispense:  10 mL    Refill:  1    Follow-up: PRN  [place]

## 2016-03-26 ENCOUNTER — Encounter: Payer: Self-pay | Admitting: Nurse Practitioner

## 2016-03-26 ENCOUNTER — Ambulatory Visit (INDEPENDENT_AMBULATORY_CARE_PROVIDER_SITE_OTHER): Payer: Medicare Other | Admitting: Nurse Practitioner

## 2016-03-26 VITALS — BP 140/89 | HR 88

## 2016-03-26 DIAGNOSIS — E1142 Type 2 diabetes mellitus with diabetic polyneuropathy: Secondary | ICD-10-CM | POA: Diagnosis not present

## 2016-03-26 DIAGNOSIS — G6289 Other specified polyneuropathies: Secondary | ICD-10-CM | POA: Diagnosis not present

## 2016-03-26 NOTE — Patient Instructions (Addendum)
Continue Lyrica at current dose  Continue Cymbalta at current dose Continue Metanx at current dose F/U 6 months

## 2016-03-26 NOTE — Progress Notes (Signed)
I have reviewed and agreed above plan. 

## 2016-03-26 NOTE — Progress Notes (Signed)
[facility]  PATIENT: Douglas [name] DOB: 26-Jul-1945   REASON FOR VISIT: follow up for polyneuropathy HISTORY FROM: Patient    HISTORY OF PRESENT ILLNESS:Douglas [name] 70 year old male returns for followup. He has a history of bilateral feet and hand paresthesias. He also has a history of diabetes since 2000, follow-up for diabetic peripheral neuropathy He has gout, obesity, obstructive sleep apnea but could not tolerate CPA, and diabetes for since 2000. He has no distal leg weakness, history of low back pain and occasional right lower extremity shooting pain. EMG nerve conduction showed evidence of length dependent mild axonal peripheral neuropathy. There is no evidence of right lumbosacral radiculopathy. Patient has had side effects to gabapentin in the past which cause swelling and rash. He did not find the cream to be beneficial.Laboratory evaluation in October 2013 showed normal TSH, B12, ESR, C-reactive protein, protein electrophoresis. He was taking Lyrica 179m 3 times a day, but complains of unbearable bilateral feet and fingertips paresthesia, I have added on Trileptal 150 mg twice a day since his previous visit in June 2015, he did not notice any significant benefit, He continued to be active, driving his grandchildren each day, also complains of chronic low back pain, previously had epidural injection, has been very helpful, Most recent A1c was 6.0.  UPDATE September 25 2015: Last visit was with Douglas [name] December 2016, he continue complains significant bilateral feet and fingertips paresthesia, He is currently on Cymbalta by his primary care, and Lyrica 100 mg 3 times a day, which has been helpful, he denies significant side effect from combination therapy, in addition he was started on Metanx he noticed definite benefit, most recent A1c 6.4.  He noticed worsening bilateral feet  plantar surface and toe pain after bearing weight, he also noticed bilateral hands  paresthesia, he is hands involvement was before the feet in 2012, We have reviewed laboratory evaluations, there was increased [place] chain in February 2017, he was evaluated by oncologist, was deemed due to his chronic renal disease, UPDATE 12/13/2017CMMr. [name], 70year old male returns for follow-up with his polyneuropathy. His symptoms are better control with his Lyrica increased at last visit. He is also on Cymbalta and  Metanx. Most recent hemoglobin A1c 6.3. He denies any recent falls, does not use an assistive device. He returns for reevaluation  REVIEW OF SYSTEMS: Full 14 system review of systems performed and notable only for those listed, all others are neg:  Constitutional: neg  Cardiovascular: neg Ear/Nose/Throat: neg  Skin: neg Eyes: Light sensitivity  Respiratory: neg Gastroitestinal: neg  Hematology/Lymphatic: neg  Endocrine: neg Musculoskeletal: Joint pain walking difficulty Allergy/Immunology: neg Neurological: Numbness Psychiatric: Anxiety Sleep : neg   ALLERGIES: Allergies  Allergen Reactions  . Atenolol   . Hctz [Hydrochlorothiazide]   . Bee Venom Swelling  . Benadryl [Diphenhydramine Hcl (Sleep)]     Hyperactivity   . Enalapril     itching  . Gabapentin     rash    HOME MEDICATIONS: Outpatient Medications Prior to Visit  Medication Sig Dispense Refill  . allopurinol (ZYLOPRIM) 300 MG tablet Take 1 tablet (300 mg total) by mouth daily. 90 tablet 3  . ALPRAZolam (XANAX) 1 MG tablet TAKE 1/2 TO 1 TABLET BY MOUTH 3 TIMES A DAY AS NEEDED 90 tablet 3  . aspirin 81 MG tablet Take 81 mg by mouth daily.    . Blood Glucose Monitoring Suppl (FREESTYLE FREEDOM LITE) W/DEVICE KIT Use as directed 1 each 0  . cetirizine (  ZYRTEC) 10 MG tablet Take 10 mg by mouth daily. Reported on 08/22/2015    . colchicine 0.6 MG tablet Take 1 tablet (0.6 mg total) by mouth 2 (two) times daily. 60 tablet 5  . doxazosin (CARDURA) 8 MG tablet Take 1 tablet (8 mg total) by  mouth at bedtime. 90 tablet 2  . DULoxetine (CYMBALTA) 60 MG capsule Take 1 capsule (60 mg total) by mouth daily. 30 capsule 11  . esomeprazole (NEXIUM) 40 MG capsule Take 1 capsule (40 mg total) by mouth daily at 12 noon. 90 capsule 3  . furosemide (LASIX) 40 MG tablet Take 1 tablet (40 mg total) by mouth daily. 90 tablet 3  . glimepiride (AMARYL) 2 MG tablet TAKE 1 TABLET(2 MG) BY MOUTH TWICE DAILY 90 tablet 1  . glucose blood (FREESTYLE LITE) test strip Use as directed to check blood sugar twice daily.  Dx: E11.29 200 each 3  . l-methylfolate-B6-B12 (METANX) 3-35-2 MG TABS tablet Take 1 tablet by mouth 2 (two) times daily. 180 tablet 3  . levothyroxine (SYNTHROID, LEVOTHROID) 100 MCG tablet TAKE 1 TABLET BY MOUTH EVERY DAY EXCEPT MONDAY AND WEDNESDAY, TAKE 2 TABLETS ON MONDAY AND WEDNESDAY 90 tablet 3  . losartan (COZAAR) 25 MG tablet Take 25 mg by mouth daily.    . Magnesium 500 MG TABS Take by mouth.    . multivitamin (METANX) 3-35-2 MG TABS tablet TAKE ONE TABLET BY MOUTH TWO TIMES DAILY 180 tablet 0  . pregabalin (LYRICA) 100 MG capsule Take 1 capsule (100 mg total) by mouth 4 (four) times daily. 120 capsule 5  . simvastatin (ZOCOR) 40 MG tablet Take 1 tablet (40 mg total) by mouth at bedtime. 90 tablet 3  . Testosterone Propionate 2 % CREA Place 1 mL onto the skin daily.    Marland Kitchen triamcinolone cream (KENALOG) 0.1 % Reported on 08/22/2015    . trimethoprim-polymyxin b (POLYTRIM) ophthalmic solution Place 1 drop into the right eye 4 (four) times daily. 10 mL 1  . vitamin B-12 (CYANOCOBALAMIN) 1000 MCG tablet Take 500 mcg by mouth daily.     . Vitamins A & D (VITAMIN A & D) 8000-400 UNITS CAPS Take by mouth.     No facility-administered medications prior to visit.     PAST MEDICAL HISTORY: Past Medical History:  Diagnosis Date  . Depression   . Diabetes mellitus without complication ([place])   . Gout   . Hypertension   . Kidney cysts   . Kidney stones    Douglas [name]  . Neuropathy ([facility])     diabetes  . OSA (obstructive sleep apnea)    supplemental oxygen at night  . S/P cardiac cath 1998   [facility]  . Syncope and collapse   . Temporary low platelet count ([place])     PAST SURGICAL HISTORY: Past Surgical History:  Procedure Laterality Date  . BACK SURGERY     rupture disc lumbar spine  . KNEE SURGERY     arthroscopy  . SHOULDER SURGERY Bilateral    arthroscopic right, rotator cuff repair left    FAMILY HISTORY: Family History  Problem Relation Age of Onset  . Breast cancer Mother   . Lung cancer Mother   . Bone cancer Mother   . Heart Problems Mother   . Cancer Mother     breast, lung and rib  . AAA (abdominal aortic aneurysm) Mother   . Heart disease Mother   . Heart attack Father   . Heart disease Father   .  Heart attack Brother   . Cancer Brother     esophageal  . Colon cancer Neg Hx     SOCIAL HISTORY: Social History   Social History  . Marital status: Married    Spouse name: [name]  . Number of children: 3  . Years of education: 71   Occupational History  .      retired   Social History Main Topics  . Smoking status: Never Smoker  . Smokeless tobacco: Never Used  . Alcohol use No  . Drug use: No  . Sexual activity: Yes   Other Topics Concern  . Not on file   Social History Narrative   Lives in [place] with his wife ([name]). No children. Three step children. No pets.      Work - Patient is retired. Maintenance supervisor.      School - One year college education.      Right handed.      [place], served in [place], no combat                 PHYSICAL EXAM  Vitals:   03/26/16 1422  BP: 140/89  Pulse: 88   There is no height or weight on file to calculate BMI. Generalized: Well developed, obese male in no acute distress  Head: normocephalic and atraumatic,. Oropharynx benign  Neck: Supple, no carotid bruits  Cardiac: Regular rate rhythm, no murmur    Neurological examination   Mentation:  Alert oriented to time, place, history taking. Follows all commands speech and language fluent  Cranial nerve II-XII: Pupils were equal round reactive to light extraocular movements were full, visual field were full on confrontational test. Facial sensation and strength were normal. hearing was intact to finger rubbing bilaterally. Uvula tongue midline. head turning and shoulder shrug and were normal and symmetric.Tongue protrusion into cheek strength was normal. Motor: normal bulk and tone, full strength in the BUE, BLE, fine finger movements normal, no pronator drift. No focal weakness Sensory: Length dependent decreased light touch decreased pinprick to mid shin bilaterally decreased vibratory to ankles proprioception normal  Coordination: finger-nose-finger, heel-to-shin bilaterally, no dysmetria Reflexes: Brachioradialis 2/2, biceps 2/2, triceps 2/2, patellar 2/2, Achilles absent, plantar responses were flexor bilaterally. Gait and Station: Rising up from seated position without assistance, normal stance, moderate stride, good arm swing, smooth turning,   DIAGNOSTIC DATA (LABS, IMAGING, TESTING) - I reviewed patient records, labs, notes, testing and imaging myself where available.  Lab Results  Component Value Date   WBC 8.0 06/07/2015   HGB 13.6 06/07/2015   HCT 41.2 06/07/2015   MCV 86.3 06/07/2015   PLT 190 06/07/2015      Component Value Date/Time   NA 137 11/15/2015 1148   NA 142 12/04/2012 0414   K 3.8 11/15/2015 1148   K 4.4 12/04/2012 0414   CL 99 11/15/2015 1148   CL 110 (H) 12/04/2012 0414   CO2 32 11/15/2015 1148   CO2 26 12/04/2012 0414   GLUCOSE 104 (H) 11/15/2015 1148   GLUCOSE 116 (H) 12/04/2012 0414   BUN 16 11/15/2015 1148   BUN 32 (H) 12/04/2012 0414   CREATININE 1.33 11/15/2015 1148   CREATININE 2.33 (H) 12/04/2012 0414   CALCIUM 9.4 11/15/2015 1148   CALCIUM 9.1 12/04/2012 0414   PROT 7.0 11/15/2015 1148   PROT 6.8 12/02/2012 2139   ALBUMIN 4.0  11/15/2015 1148   ALBUMIN 3.7 12/02/2012 2139   AST 22 11/15/2015 1148   AST 81 (  H) 12/02/2012 2139   ALT 20 11/15/2015 1148   ALT 74 12/02/2012 2139   ALKPHOS 78 11/15/2015 1148   ALKPHOS 64 12/02/2012 2139   BILITOT 0.8 11/15/2015 1148   BILITOT 0.5 12/02/2012 2139   GFRNONAA 51 (L) 06/07/2015 1517   GFRNONAA 28 (L) 12/04/2012 0414   GFRAA 59 (L) 06/07/2015 1517   GFRAA 32 (L) 12/04/2012 0414   Lab Results  Component Value Date   CHOL 130 08/30/2015   HDL 38.60 (L) 08/30/2015   LDLCALC 64 08/30/2015   TRIG 137.0 08/30/2015   CHOLHDL 3 08/30/2015   Lab Results  Component Value Date   HGBA1C 6.3 11/15/2015   Lab Results  Component Value Date   VQMGQQPY19 509 05/02/2013   Lab Results  Component Value Date   TSH 2.16 11/15/2015      ASSESSMENT AND PLAN  70 y.o. year old male  has a past medical history Peripheral neuropathy with contributing factors of diabetes. Laboratory evaluation in February 2017 showed A light chain he was evaluated by oncologist consider abnormal labs due to chronic kidney disease  PLAN: Continue Lyrica at current dose  Continue Cymbalta at current dose Continue Metanx at current dose Due to insurance reasons patient asked to hold off on refills at this time. He has enough medication to last for at least a month F/U 6 months Dennie Bible, [facility], [facility], APRN  [facility] [address] [place], [place] [address] [phone]

## 2016-03-29 ENCOUNTER — Other Ambulatory Visit: Payer: Self-pay | Admitting: Family Medicine

## 2016-04-02 DIAGNOSIS — D1800 Hemangioma unspecified site: Secondary | ICD-10-CM | POA: Diagnosis not present

## 2016-04-02 DIAGNOSIS — L810 Postinflammatory hyperpigmentation: Secondary | ICD-10-CM | POA: Diagnosis not present

## 2016-04-02 DIAGNOSIS — L578 Other skin changes due to chronic exposure to nonionizing radiation: Secondary | ICD-10-CM | POA: Diagnosis not present

## 2016-04-02 DIAGNOSIS — L812 Freckles: Secondary | ICD-10-CM | POA: Diagnosis not present

## 2016-04-02 DIAGNOSIS — I10 Essential (primary) hypertension: Secondary | ICD-10-CM | POA: Diagnosis not present

## 2016-04-02 DIAGNOSIS — E119 Type 2 diabetes mellitus without complications: Secondary | ICD-10-CM | POA: Diagnosis not present

## 2016-04-02 DIAGNOSIS — L859 Epidermal thickening, unspecified: Secondary | ICD-10-CM | POA: Diagnosis not present

## 2016-04-02 DIAGNOSIS — L820 Inflamed seborrheic keratosis: Secondary | ICD-10-CM | POA: Diagnosis not present

## 2016-04-02 DIAGNOSIS — D229 Melanocytic nevi, unspecified: Secondary | ICD-10-CM | POA: Diagnosis not present

## 2016-04-02 DIAGNOSIS — L821 Other seborrheic keratosis: Secondary | ICD-10-CM | POA: Diagnosis not present

## 2016-04-02 DIAGNOSIS — Z1283 Encounter for screening for malignant neoplasm of skin: Secondary | ICD-10-CM | POA: Diagnosis not present

## 2016-04-04 ENCOUNTER — Other Ambulatory Visit: Payer: Self-pay | Admitting: Family Medicine

## 2016-04-04 NOTE — Telephone Encounter (Signed)
Refilled 08/30/15. Pt last seen 03/21/16. Please advise?

## 2016-04-09 MED ORDER — ALPRAZOLAM 1 MG PO TABS: ORAL_TABLET | ORAL | 3 refills | Status: DC

## 2016-04-09 NOTE — Telephone Encounter (Signed)
faxed

## 2016-04-29 ENCOUNTER — Other Ambulatory Visit: Payer: Self-pay | Admitting: *Deleted

## 2016-05-02 ENCOUNTER — Telehealth: Payer: Self-pay | Admitting: *Deleted

## 2016-05-02 ENCOUNTER — Other Ambulatory Visit: Payer: Self-pay | Admitting: *Deleted

## 2016-05-02 MED ORDER — PREGABALIN 150 MG PO CAPS: 150.0000 mg | ORAL_CAPSULE | Freq: Three times a day (TID) | ORAL | 5 refills | Status: DC

## 2016-05-02 NOTE — Telephone Encounter (Signed)
He is currently taking Lyrica 100mg , QID.  His insurance company, in 2018, will only allow Lyrica for a total of three daily doses (#90/30).  He is still having mild symptoms, despite taking a total of 400mg  daily.  Per vo by Dr. Krista Blue, provide new rx for Lyrica 150mg , TID.  Pt is agreeable to try the reduced frequency and increased dosage.

## 2016-05-20 ENCOUNTER — Encounter: Payer: Self-pay | Admitting: Family Medicine

## 2016-05-20 ENCOUNTER — Ambulatory Visit (INDEPENDENT_AMBULATORY_CARE_PROVIDER_SITE_OTHER): Payer: Medicare Other | Admitting: Family Medicine

## 2016-05-20 VITALS — BP 148/73 | HR 90 | Temp 98.3°F | Ht 71.0 in | Wt 291.8 lb

## 2016-05-20 DIAGNOSIS — E785 Hyperlipidemia, unspecified: Secondary | ICD-10-CM

## 2016-05-20 DIAGNOSIS — E118 Type 2 diabetes mellitus with unspecified complications: Secondary | ICD-10-CM

## 2016-05-20 DIAGNOSIS — N2581 Secondary hyperparathyroidism of renal origin: Secondary | ICD-10-CM | POA: Diagnosis not present

## 2016-05-20 DIAGNOSIS — R600 Localized edema: Secondary | ICD-10-CM | POA: Diagnosis not present

## 2016-05-20 DIAGNOSIS — E1140 Type 2 diabetes mellitus with diabetic neuropathy, unspecified: Secondary | ICD-10-CM | POA: Diagnosis not present

## 2016-05-20 DIAGNOSIS — N183 Chronic kidney disease, stage 3 (moderate): Secondary | ICD-10-CM | POA: Diagnosis not present

## 2016-05-20 DIAGNOSIS — I10 Essential (primary) hypertension: Secondary | ICD-10-CM | POA: Diagnosis not present

## 2016-05-20 NOTE — Progress Notes (Signed)
Subjective:  Patient ID: Douglas [name], male    DOB: [birth year]  Age: 71 y.o. MRN: [id number]  CC: Follow up  HPI:  71 year old male with Diabetes with severe neuropathy, CKD, hypertension, HLD, hypothyroidism presents for follow up.  HTN  BP been trending up.  He saw his nephrologist this morning and his losartan has been increased to 50 mg.  HLD  Stable on Zocor.  DM-2  Sugars at goal. He endorses compliance with his Amaryl,  Neuropathy  Severe but relatively well-controlled this time on Lyrica, Cymbalta, Metanx.  Social Hx   Social History   Social History  . Marital status: Married    Spouse name: [name]  . Number of children: 3  . Years of education: 4   Occupational History  .      retired   Social History Main Topics  . Smoking status: Never Smoker  . Smokeless tobacco: Never Used  . Alcohol use No  . Drug use: No  . Sexual activity: Yes   Other Topics Concern  . None   Social History Narrative   Lives in [place] with his wife ([name]). No children. Three step children. No pets.      Work - Patient is retired. Maintenance supervisor.      School - One year college education.      Right handed.      [place], served in [place], no combat                Review of Systems  Constitutional: Negative.   Neurological:       Neuropathy.   Objective:  BP (!) 148/73   Pulse 90   Temp 98.3 F (36.8 C) (Oral)   Ht 5\' 11"  (1.803 m)   Wt 291 lb 12.8 oz (132.4 kg)   SpO2 99%   BMI 40.70 kg/m   BP/Weight 05/20/2016 03/26/2016 46/09/5991  Systolic BP 570 177 939  Diastolic BP 73 89 82  Wt. (Lbs) 291.8 - 281.5  BMI 40.7 - 39.26   Physical Exam  Constitutional: He is oriented to person, place, and time. He appears well-developed. No distress.  Cardiovascular: Normal rate and regular rhythm.   Pulmonary/Chest: Effort normal and breath sounds normal.  Neurological: He is alert and oriented to person, place, and  time.  Psychiatric: He has a normal mood and affect.  Vitals reviewed.   Lab Results  Component Value Date   WBC 8.0 06/07/2015   HGB 13.6 06/07/2015   HCT 41.2 06/07/2015   PLT 190 06/07/2015   GLUCOSE 104 (H) 11/15/2015   CHOL 130 08/30/2015   TRIG 137.0 08/30/2015   HDL 38.60 (L) 08/30/2015   LDLCALC 64 08/30/2015   ALT 20 11/15/2015   AST 22 11/15/2015   NA 137 11/15/2015   K 3.8 11/15/2015   CL 99 11/15/2015   CREATININE 1.33 11/15/2015   BUN 16 11/15/2015   CO2 32 11/15/2015   TSH 2.16 11/15/2015   PSA 0.81 01/27/2015   HGBA1C 6.3 11/15/2015   MICROALBUR <0.7 08/30/2015    Assessment & Plan:   Problem List Items Addressed This Visit    Controlled diabetes mellitus type 2 with complications ([place])    Stable. Continue Amaryl. A1c at next visit.      Relevant Medications   losartan (COZAAR) 50 MG tablet   Diabetic neuropathy, type II diabetes mellitus ([place])    Stable. Continue Lyrica, Cymbalta, Metanx.  Relevant Medications   losartan (COZAAR) 50 MG tablet   Essential hypertension    Not at goal. Nephrologist increased losartan. I am in agreement.      Relevant Medications   losartan (COZAAR) 50 MG tablet   Hyperlipidemia    At goal. Continue Zocor.      Relevant Medications   losartan (COZAAR) 50 MG tablet     Meds ordered this encounter  Medications  . losartan (COZAAR) 50 MG tablet    Sig: Take 50 mg by mouth daily.    Follow-up: Return in about 3 months (around 08/17/2016).  Dale

## 2016-05-20 NOTE — Assessment & Plan Note (Signed)
Stable. Continue Amaryl. A1c at next visit.

## 2016-05-20 NOTE — Assessment & Plan Note (Signed)
Not at goal. Nephrologist increased losartan. I am in agreement.

## 2016-05-20 NOTE — Assessment & Plan Note (Signed)
At goal.  Continue Zocor. 

## 2016-05-20 NOTE — Assessment & Plan Note (Signed)
Stable. Continue Lyrica, Cymbalta, Metanx.

## 2016-05-20 NOTE — Patient Instructions (Signed)
Continue your meds.  Labs at follow up in 3 months.  Take care  Dr. Lacinda Axon

## 2016-05-20 NOTE — Progress Notes (Signed)
Pre visit review using our clinic review tool, if applicable. No additional management support is needed unless otherwise documented below in the visit note. 

## 2016-05-22 DIAGNOSIS — H524 Presbyopia: Secondary | ICD-10-CM | POA: Diagnosis not present

## 2016-05-22 DIAGNOSIS — H16143 Punctate keratitis, bilateral: Secondary | ICD-10-CM | POA: Diagnosis not present

## 2016-05-22 DIAGNOSIS — H2513 Age-related nuclear cataract, bilateral: Secondary | ICD-10-CM | POA: Diagnosis not present

## 2016-05-22 DIAGNOSIS — E089 Diabetes mellitus due to underlying condition without complications: Secondary | ICD-10-CM | POA: Diagnosis not present

## 2016-05-22 DIAGNOSIS — E119 Type 2 diabetes mellitus without complications: Secondary | ICD-10-CM | POA: Diagnosis not present

## 2016-06-05 ENCOUNTER — Other Ambulatory Visit: Payer: Self-pay | Admitting: Family Medicine

## 2016-06-05 MED ORDER — DOXAZOSIN MESYLATE 8 MG PO TABS: 8.0000 mg | ORAL_TABLET | Freq: Every day | ORAL | 2 refills | Status: DC

## 2016-06-05 NOTE — Telephone Encounter (Signed)
Refilled 08/30/15. Pt last seen 05/20/16. Please advise?

## 2016-06-10 ENCOUNTER — Other Ambulatory Visit: Payer: Self-pay | Admitting: Family Medicine

## 2016-06-10 MED ORDER — DULOXETINE HCL 60 MG PO CPEP: 60.0000 mg | ORAL_CAPSULE | Freq: Every day | ORAL | 3 refills | Status: DC

## 2016-06-11 LAB — HM DIABETES EYE EXAM

## 2016-06-17 ENCOUNTER — Encounter: Payer: Self-pay | Admitting: Family Medicine

## 2016-06-30 ENCOUNTER — Other Ambulatory Visit: Payer: Self-pay | Admitting: Family Medicine

## 2016-07-02 ENCOUNTER — Ambulatory Visit (INDEPENDENT_AMBULATORY_CARE_PROVIDER_SITE_OTHER): Payer: Medicare Other

## 2016-07-02 VITALS — BP 136/70 | HR 71 | Temp 98.1°F | Resp 14 | Ht 71.0 in | Wt 284.0 lb

## 2016-07-02 DIAGNOSIS — Z0000 Encounter for general adult medical examination without abnormal findings: Secondary | ICD-10-CM

## 2016-07-02 NOTE — Progress Notes (Signed)
Subjective:   Douglas [name] is a 71 y.o. male who presents for Medicare Annual/Subsequent preventive examination.  Review of Systems:  No ROS.  Medicare Wellness Visit.  Cardiac Risk Factors include: advanced age (>55mn, >>37women);diabetes mellitus;obesity (BMI >30kg/m2);hypertension;male gender     Objective:    Vitals: BP 136/70 (BP Location: Left Arm, Patient Position: Sitting, Cuff Size: Normal)   Pulse 71   Temp 98.1 F (36.7 C) (Oral)   Resp 14   Ht 5' 11"  (1.803 m)   Wt 284 lb (128.8 kg)   SpO2 97%   BMI 39.61 kg/m   Body mass index is 39.61 kg/m.  Tobacco History  Smoking Status  . Never Smoker  Smokeless Tobacco  . Never Used     Counseling given: Not Answered   Past Medical History:  Diagnosis Date  . Depression   . Diabetes mellitus without complication ([place]   . Gout   . Hypertension   . Kidney cysts   . Kidney stones    Dr. WRogers Blocker . Neuropathy (HCC)    diabetes  . OSA (obstructive sleep apnea)    supplemental oxygen at night  . S/P cardiac cath 1998   [facility]  . Syncope and collapse   . Temporary low platelet count ([facility])    Past Surgical History:  Procedure Laterality Date  . BACK SURGERY     rupture disc lumbar spine  . KNEE SURGERY     arthroscopy  . SHOULDER SURGERY Bilateral    arthroscopic right, rotator cuff repair left   Family History  Problem Relation Age of Onset  . Breast cancer Mother   . Lung cancer Mother   . Bone cancer Mother   . Heart Problems Mother   . Cancer Mother     breast, lung and rib  . AAA (abdominal aortic aneurysm) Mother   . Heart disease Mother   . Heart attack Father   . Heart disease Father   . Heart attack Brother   . Cancer Brother     esophageal  . Colon cancer Neg Hx    History  Sexual Activity  . Sexual activity: Yes    Outpatient Encounter Prescriptions as of 07/02/2016  Medication Sig  . allopurinol (ZYLOPRIM) 300 MG tablet Take 1 tablet (300 mg total) by mouth daily.  .Marland Kitchen ALPRAZolam (XANAX) 1 MG tablet TAKE 1/2 TO 1 TABLET BY MOUTH 3 TIMES A DAY AS NEEDED  . aspirin 81 MG tablet Take 81 mg by mouth daily.  . Blood Glucose Monitoring Suppl (FREESTYLE FREEDOM LITE) W/DEVICE KIT Use as directed  . cetirizine (ZYRTEC) 10 MG tablet Take 10 mg by mouth daily. Reported on 08/22/2015  . colchicine 0.6 MG tablet Take 1 tablet (0.6 mg total) by mouth 2 (two) times daily.  .Marland Kitchendoxazosin (CARDURA) 8 MG tablet Take 1 tablet (8 mg total) by mouth at bedtime.  . DULoxetine (CYMBALTA) 60 MG capsule Take 1 capsule (60 mg total) by mouth daily.  .Marland Kitchenesomeprazole (NEXIUM) 40 MG capsule Take 1 capsule (40 mg total) by mouth daily at 12 noon.  . furosemide (LASIX) 40 MG tablet Take 1 tablet (40 mg total) by mouth daily.  .Marland Kitchenglimepiride (AMARYL) 2 MG tablet TAKE 1 TABLET(2 MG) BY MOUTH TWICE DAILY  . glucose blood (FREESTYLE LITE) test strip Use as directed to check blood sugar twice daily.  Dx: E11.29  . l-methylfolate-B6-B12 (METANX) 3-35-2 MG TABS tablet Take 1 tablet by mouth 2 (two) times  daily.  . levothyroxine (SYNTHROID, LEVOTHROID) 100 MCG tablet TAKE 1 TABLET BY MOUTH EVERY DAY EXCEPT MONDAY AND WEDNESDAY, TAKE 2 TABLETS ON MONDAY AND WEDNESDAY  . losartan (COZAAR) 50 MG tablet Take 50 mg by mouth daily.  . Magnesium 500 MG TABS Take by mouth.  . multivitamin (METANX) 3-35-2 MG TABS tablet TAKE ONE TABLET BY MOUTH TWO TIMES DAILY  . pregabalin (LYRICA) 150 MG capsule Take 1 capsule (150 mg total) by mouth 3 (three) times daily.  . simvastatin (ZOCOR) 40 MG tablet Take 1 tablet (40 mg total) by mouth at bedtime.  . Testosterone Propionate 2 % CREA Place 1 mL onto the skin daily.  Marland Kitchen triamcinolone cream (KENALOG) 0.1 % Reported on 08/22/2015  . vitamin B-12 (CYANOCOBALAMIN) 1000 MCG tablet Take 500 mcg by mouth daily.   . Vitamins A & D (VITAMIN A & D) 8000-400 UNITS CAPS Take by mouth.   No facility-administered encounter medications on file as of 07/02/2016.     Activities of  Daily Living In your present state of health, do you have any difficulty performing the following activities: 07/02/2016 07/03/2015  Hearing? N N  Vision? N N  Difficulty concentrating or making decisions? N N  Walking or climbing stairs? Y Y  Dressing or bathing? N N  Doing errands, shopping? N N  Preparing Food and eating ? N N  Using the Toilet? N N  In the past six months, have you accidently leaked urine? N Y  Do you have problems with loss of bowel control? N N  Managing your Medications? N N  Managing your Finances? N N  Housekeeping or managing your Housekeeping? N N  Some recent data might be hidden    Patient Care Team: Coral Spikes, DO as PCP - General (Family Medicine) Anthonette Legato, MD (Internal Medicine) Watt Climes, PA as Physician Assistant (Physician Assistant)   Assessment:    This is a routine wellness examination for Douglas [name]. The goal of the wellness visit is to assist the patient how to close the gaps in care and create a preventative care plan for the patient.   Taking calcium VIT D as appropriate/Osteoporosis risk reviewed.  Medications reviewed; taking without issues or barriers.  Safety issues reviewed; smoke detectors in the home. No firearms in the home.  Wears seatbelts when driving or riding with others. Patient does wear sunscreen or protective clothing when in direct sunlight. No violence in the home.  Patient is alert, normal appearance, oriented to person/place/and time. Correctly identified the president of the [place], recall of 3/3 words, and performing simple calculations.  Patient displays appropriate judgement and can read correct time from watch face.  No new identified risk were noted.  No failures at ADL's or IADL's.   BMI- discussed the importance of a healthy diet, water intake and exercise. Educational material provided.   HTN- followed by PCP.  Dental- every six months. Dr. Wayne Both ([place])  Eye- Visual acuity not assessed  per patient preference since they have regular follow up with the ophthalmologist.  Wears corrective lenses.  Sleep patterns- Sleeps 8 hours at night.  Wakes feeling rested. Home oxygen set at 2L in use at bedtime.  Patient Concerns: None at this time. Follow up with PCP as needed.  Exercise Activities and Dietary recommendations Current Exercise Habits: Home exercise routine, Type of exercise: walking, Time (Minutes): 20, Frequency (Times/Week): 3, Weekly Exercise (Minutes/Week): 60, Intensity: Mild  Goals    . Healthy Lifestyle  Low carb, low sugar foods Stay hydrated, drink plenty of water     . Increase physical activity          Chair exercises. As demonstrated, as tolerated. Walk as tolerated.      Fall Risk Fall Risk  07/02/2016 03/03/2016 02/04/2016 11/15/2015 07/03/2015  Falls in the past year? Yes (No Data) Yes Yes No  Number falls in past yr: 1 - 2 or more 2 or more -  Injury with Fall? No - (No Data) No -  Risk Factor Category  - - High Fall Risk - -  Risk for fall due to : (No Data) - History of fall(s) - -  Risk for fall due to (comments): Lean forward too far - - - -  Follow up Falls prevention discussed;Education provided - Education provided - -   Depression Screen PHQ 2/9 Scores 07/02/2016 02/04/2016 11/15/2015 07/03/2015  PHQ - 2 Score 0 0 0 0    Cognitive Function MMSE - Mini Mental State Exam 07/02/2016 07/03/2015  Orientation to time 5 5  Orientation to Place 5 5  Registration 3 3  Attention/ Calculation 5 5  Recall 3 3  Language- name 2 objects 2 2  Language- repeat 1 1  Language- follow 3 step command 3 3  Language- read & follow direction 1 1  Write a sentence 1 1  Copy design 1 1  Total score 30 30        Immunization History  Administered Date(s) Administered  . Influenza, High Dose Seasonal PF 02/15/2016  . Influenza,inj,Quad PF,36+ Mos 01/04/2013, 02/20/2014, 02/27/2015  . Pneumococcal Conjugate-13 05/02/2013  . Pneumococcal  Polysaccharide-23 01/05/2011  . Tdap 01/05/2008   Screening Tests Health Maintenance  Topic Date Due  . FOOT EXAM  11/24/2015  . HEMOGLOBIN A1C  05/17/2016  . OPHTHALMOLOGY EXAM  06/11/2017  . TETANUS/TDAP  01/04/2018  . INFLUENZA VACCINE  Completed  . Hepatitis C Screening  Completed  . PNA vac Low Risk Adult  Completed      Plan:   End of life planning; Advanced aging; Advanced directives discussed.  No HCPOA/Living Will.  Additional information declined at this time.  Medicare Attestation I have personally reviewed: The patient's medical and social history Their use of alcohol, tobacco or illicit drugs Their current medications and supplements The patient's functional ability including ADLs,fall risks, home safety risks, cognitive, and hearing and visual impairment Diet and physical activities Evidence for depression   The patient's weight, height, BMI, and visual acuity have been recorded in the chart.  I have made referrals and provided education to the patient based on review of the above and I have provided the patient with a written personalized care plan for preventive services.    During the course of the visit the patient was educated and counseled about the following appropriate screening and preventive services:   Vaccines to include Pneumoccal, Influenza, Hepatitis B, Td, Zostavax, HCV  Colorectal cancer screening  Diabetes screening  Prostate Cancer Screening  Glaucoma screening-annual eye exam  Nutrition counseling   Patient Instructions (the written plan) was given to the patient.    Varney Biles, LPN  [id number]/[id number]

## 2016-07-02 NOTE — Progress Notes (Signed)
Care was provided under my supervision. I agree with the management as indicated in the note.  Barney Russomanno DO  

## 2016-07-02 NOTE — Patient Instructions (Addendum)
Mr. Kopka , Thank you for taking time to come for your Medicare Wellness Visit. I appreciate your ongoing commitment to your health goals. Please review the following plan we discussed and let me know if I can assist you in the future.   Follow up with Dr. Lacinda Axon as needed.    Bring a copy of your [place] and/or Living Will to be scanned into chart.  Have a great day!  These are the goals we discussed: Goals    . Healthy Lifestyle          Low carb, low sugar foods Stay hydrated, drink plenty of water     . Increase physical activity          Chair exercises. As demonstrated, as tolerated. Walk as tolerated.       This is a list of the screening recommended for you and due dates:  Health Maintenance  Topic Date Due  . Complete foot exam   11/24/2015  . Hemoglobin A1C  05/17/2016  . Eye exam for diabetics  06/11/2017  . Tetanus Vaccine  01/04/2018  . Flu Shot  Completed  .  Hepatitis C: One time screening is recommended by Center for Disease Control  (CDC) for  adults born from [birth year] through 1965.   Completed  . Pneumonia vaccines  Completed      Fall Prevention in the Home Falls can cause injuries. They can happen to people of all ages. There are many things you can do to make your home safe and to help prevent falls. What can I do on the outside of my home?  Regularly fix the edges of walkways and driveways and fix any cracks.  Remove anything that might make you trip as you walk through a door, such as a raised step or threshold.  Trim any bushes or trees on the path to your home.  Use bright outdoor lighting.  Clear any walking paths of anything that might make someone trip, such as rocks or tools.  Regularly check to see if handrails are loose or broken. Make sure that both sides of any steps have handrails.  Any raised decks and porches should have guardrails on the edges.  Have any leaves, snow, or ice cleared regularly.  Use  sand or salt on walking paths during winter.  Clean up any spills in your garage right away. This includes oil or grease spills. What can I do in the bathroom?  Use night lights.  Install grab bars by the toilet and in the tub and shower. Do not use towel bars as grab bars.  Use non-skid mats or decals in the tub or shower.  If you need to sit down in the shower, use a plastic, non-slip stool.  Keep the floor dry. Clean up any water that spills on the floor as soon as it happens.  Remove soap buildup in the tub or shower regularly.  Attach bath mats securely with double-sided non-slip rug tape.  Do not have throw rugs and other things on the floor that can make you trip. What can I do in the bedroom?  Use night lights.  Make sure that you have a light by your bed that is easy to reach.  Do not use any sheets or blankets that are too big for your bed. They should not hang down onto the floor.  Have a firm chair that has side arms. You can use this for support while you get  dressed.  Do not have throw rugs and other things on the floor that can make you trip. What can I do in the kitchen?  Clean up any spills right away.  Avoid walking on wet floors.  Keep items that you use a lot in easy-to-reach places.  If you need to reach something above you, use a strong step stool that has a grab bar.  Keep electrical cords out of the way.  Do not use floor polish or wax that makes floors slippery. If you must use wax, use non-skid floor wax.  Do not have throw rugs and other things on the floor that can make you trip. What can I do with my stairs?  Do not leave any items on the stairs.  Make sure that there are handrails on both sides of the stairs and use them. Fix handrails that are broken or loose. Make sure that handrails are as long as the stairways.  Check any carpeting to make sure that it is firmly attached to the stairs. Fix any carpet that is loose or worn.  Avoid  having throw rugs at the top or bottom of the stairs. If you do have throw rugs, attach them to the floor with carpet tape.  Make sure that you have a light switch at the top of the stairs and the bottom of the stairs. If you do not have them, ask someone to add them for you. What else can I do to help prevent falls?  Wear shoes that:  Do not have high heels.  Have rubber bottoms.  Are comfortable and fit you well.  Are closed at the toe. Do not wear sandals.  If you use a stepladder:  Make sure that it is fully opened. Do not climb a closed stepladder.  Make sure that both sides of the stepladder are locked into place.  Ask someone to hold it for you, if possible.  Clearly mark and make sure that you can see:  Any grab bars or handrails.  First and last steps.  Where the edge of each step is.  Use tools that help you move around (mobility aids) if they are needed. These include:  Canes.  Walkers.  Scooters.  Crutches.  Turn on the lights when you go into a dark area. Replace any light bulbs as soon as they burn out.  Set up your furniture so you have a clear path. Avoid moving your furniture around.  If any of your floors are uneven, fix them.  If there are any pets around you, be aware of where they are.  Review your medicines with your doctor. Some medicines can make you feel dizzy. This can increase your chance of falling. Ask your doctor what other things that you can do to help prevent falls. This information is not intended to replace advice given to you by your health care provider. Make sure you discuss any questions you have with your health care provider. Document Released: 01/25/2009 Document Revised: 09/06/2015 Document Reviewed: 05/05/2014 Elsevier Interactive Patient Education  2017 [employer].

## 2016-07-04 ENCOUNTER — Encounter: Payer: Self-pay | Admitting: Family Medicine

## 2016-07-04 DIAGNOSIS — H2513 Age-related nuclear cataract, bilateral: Secondary | ICD-10-CM | POA: Diagnosis not present

## 2016-07-04 LAB — HM DIABETES EYE EXAM

## 2016-07-08 DIAGNOSIS — H2513 Age-related nuclear cataract, bilateral: Secondary | ICD-10-CM | POA: Diagnosis not present

## 2016-07-14 ENCOUNTER — Encounter: Payer: Self-pay | Admitting: *Deleted

## 2016-07-15 NOTE — H&P (Signed)
See scanned note.

## 2016-07-16 ENCOUNTER — Encounter: Payer: Self-pay | Admitting: *Deleted

## 2016-07-16 ENCOUNTER — Ambulatory Visit: Payer: Medicare Other | Admitting: Anesthesiology

## 2016-07-16 ENCOUNTER — Ambulatory Visit
Admission: RE | Admit: 2016-07-16 | Discharge: 2016-07-16 | Disposition: A | Discharge status: Home / Self Care | Payer: Medicare Other | Source: Ambulatory Visit | Attending: Ophthalmology | Admitting: Ophthalmology

## 2016-07-16 ENCOUNTER — Encounter
Admission: RE | Disposition: A | Discharge status: Home / Self Care | Payer: Self-pay | Source: Ambulatory Visit | Attending: Ophthalmology

## 2016-07-16 DIAGNOSIS — K219 Gastro-esophageal reflux disease without esophagitis: Secondary | ICD-10-CM | POA: Insufficient documentation

## 2016-07-16 DIAGNOSIS — G4730 Sleep apnea, unspecified: Secondary | ICD-10-CM | POA: Insufficient documentation

## 2016-07-16 DIAGNOSIS — M109 Gout, unspecified: Secondary | ICD-10-CM | POA: Insufficient documentation

## 2016-07-16 DIAGNOSIS — F329 Major depressive disorder, single episode, unspecified: Secondary | ICD-10-CM | POA: Insufficient documentation

## 2016-07-16 DIAGNOSIS — E119 Type 2 diabetes mellitus without complications: Secondary | ICD-10-CM | POA: Diagnosis not present

## 2016-07-16 DIAGNOSIS — E039 Hypothyroidism, unspecified: Secondary | ICD-10-CM | POA: Diagnosis not present

## 2016-07-16 DIAGNOSIS — E1136 Type 2 diabetes mellitus with diabetic cataract: Secondary | ICD-10-CM | POA: Diagnosis not present

## 2016-07-16 DIAGNOSIS — Z6839 Body mass index (BMI) 39.0-39.9, adult: Secondary | ICD-10-CM | POA: Diagnosis not present

## 2016-07-16 DIAGNOSIS — N189 Chronic kidney disease, unspecified: Secondary | ICD-10-CM | POA: Diagnosis not present

## 2016-07-16 DIAGNOSIS — N289 Disorder of kidney and ureter, unspecified: Secondary | ICD-10-CM | POA: Insufficient documentation

## 2016-07-16 DIAGNOSIS — Z79899 Other long term (current) drug therapy: Secondary | ICD-10-CM | POA: Insufficient documentation

## 2016-07-16 DIAGNOSIS — I10 Essential (primary) hypertension: Secondary | ICD-10-CM | POA: Diagnosis not present

## 2016-07-16 DIAGNOSIS — I129 Hypertensive chronic kidney disease with stage 1 through stage 4 chronic kidney disease, or unspecified chronic kidney disease: Secondary | ICD-10-CM | POA: Diagnosis not present

## 2016-07-16 DIAGNOSIS — Z9981 Dependence on supplemental oxygen: Secondary | ICD-10-CM | POA: Diagnosis not present

## 2016-07-16 DIAGNOSIS — E669 Obesity, unspecified: Secondary | ICD-10-CM | POA: Insufficient documentation

## 2016-07-16 DIAGNOSIS — H2513 Age-related nuclear cataract, bilateral: Secondary | ICD-10-CM | POA: Diagnosis not present

## 2016-07-16 DIAGNOSIS — H2511 Age-related nuclear cataract, right eye: Secondary | ICD-10-CM | POA: Diagnosis not present

## 2016-07-16 HISTORY — PX: CATARACT EXTRACTION W/PHACO: SHX586

## 2016-07-16 HISTORY — DX: Edema, unspecified: R60.9

## 2016-07-16 HISTORY — DX: Dependence on supplemental oxygen: Z99.81

## 2016-07-16 HISTORY — DX: Gastro-esophageal reflux disease without esophagitis: K21.9

## 2016-07-16 HISTORY — DX: Hypothyroidism, unspecified: E03.9

## 2016-07-16 LAB — GLUCOSE, CAPILLARY: Glucose-Capillary: 149 mg/dL — ABNORMAL HIGH (ref 65–99)

## 2016-07-16 SURGERY — PHACOEMULSIFICATION, CATARACT, WITH IOL INSERTION
Anesthesia: Monitor Anesthesia Care | Site: Eye | Laterality: Right | Wound class: Clean

## 2016-07-16 MED ORDER — MIDAZOLAM HCL 2 MG/2ML IJ SOLN
INTRAMUSCULAR | Status: AC
  Filled 2016-07-16: qty 2

## 2016-07-16 MED ORDER — NA CHONDROIT SULF-NA HYALURON 40-17 MG/ML IO SOLN
INTRAOCULAR | Status: AC
  Filled 2016-07-16: qty 1

## 2016-07-16 MED ORDER — CARBACHOL 0.01 % IO SOLN
INTRAOCULAR | Status: DC | PRN
  Administered 2016-07-16: .5 mL via INTRAOCULAR

## 2016-07-16 MED ORDER — LIDOCAINE HCL (PF) 4 % IJ SOLN
INTRAOCULAR | Status: DC | PRN
  Administered 2016-07-16: 2 mL via OPHTHALMIC

## 2016-07-16 MED ORDER — PROPARACAINE HCL 0.5 % OP SOLN
OPHTHALMIC | Status: DC | PRN
  Administered 2016-07-16: 1 [drp] via OPHTHALMIC

## 2016-07-16 MED ORDER — MOXIFLOXACIN HCL 0.5 % OP SOLN
OPHTHALMIC | Status: DC | PRN
Start: 2016-07-16 — End: 2016-07-16
  Administered 2016-07-16: 1 [drp] via OPHTHALMIC

## 2016-07-16 MED ORDER — PHENYLEPHRINE HCL 10 % OP SOLN
OPHTHALMIC | Status: AC
  Administered 2016-07-16: 1 [drp] via OPHTHALMIC
  Filled 2016-07-16: qty 5

## 2016-07-16 MED ORDER — CEFUROXIME OPHTHALMIC INJECTION 1 MG/0.1 ML
INJECTION | OPHTHALMIC | Status: DC | PRN
  Administered 2016-07-16: .1 mL via INTRACAMERAL

## 2016-07-16 MED ORDER — BUPIVACAINE HCL (PF) 0.75 % IJ SOLN
INTRAMUSCULAR | Status: AC
  Filled 2016-07-16: qty 10

## 2016-07-16 MED ORDER — POVIDONE-IODINE 5 % OP SOLN
OPHTHALMIC | Status: AC
  Filled 2016-07-16: qty 30

## 2016-07-16 MED ORDER — EPINEPHRINE PF 1 MG/ML IJ SOLN
INTRAMUSCULAR | Status: AC
  Filled 2016-07-16: qty 2

## 2016-07-16 MED ORDER — POVIDONE-IODINE 5 % OP SOLN
OPHTHALMIC | Status: DC | PRN
  Administered 2016-07-16: 1 via OPHTHALMIC

## 2016-07-16 MED ORDER — MOXIFLOXACIN HCL 0.5 % OP SOLN
OPHTHALMIC | Status: AC
  Administered 2016-07-16: 1 [drp] via OPHTHALMIC
  Filled 2016-07-16: qty 3

## 2016-07-16 MED ORDER — LIDOCAINE HCL (PF) 4 % IJ SOLN
INTRAMUSCULAR | Status: DC | PRN
  Administered 2016-07-16: 4 mL via OPHTHALMIC

## 2016-07-16 MED ORDER — MIDAZOLAM HCL 2 MG/2ML IJ SOLN
INTRAMUSCULAR | Status: DC | PRN
  Administered 2016-07-16: 1 mg via INTRAVENOUS

## 2016-07-16 MED ORDER — CEFUROXIME OPHTHALMIC INJECTION 1 MG/0.1 ML
INJECTION | OPHTHALMIC | Status: AC
  Filled 2016-07-16: qty 0.1

## 2016-07-16 MED ORDER — TETRACAINE HCL 0.5 % OP SOLN
OPHTHALMIC | Status: DC | PRN
  Administered 2016-07-16: 1 [drp] via OPHTHALMIC

## 2016-07-16 MED ORDER — ALFENTANIL 500 MCG/ML IJ INJ
INJECTION | INTRAMUSCULAR | Status: DC | PRN
Start: 2016-07-16 — End: 2016-07-16
  Administered 2016-07-16: 500 ug via INTRAVENOUS
  Administered 2016-07-16: 250 ug via INTRAVENOUS

## 2016-07-16 MED ORDER — CYCLOPENTOLATE HCL 2 % OP SOLN
1.0000 [drp] | OPHTHALMIC | Status: AC
Start: 2016-07-16 — End: 2016-07-16
  Administered 2016-07-16 (×4): 1 [drp] via OPHTHALMIC

## 2016-07-16 MED ORDER — MOXIFLOXACIN HCL 0.5 % OP SOLN
1.0000 [drp] | OPHTHALMIC | Status: AC
  Administered 2016-07-16 (×3): 1 [drp] via OPHTHALMIC

## 2016-07-16 MED ORDER — TETRACAINE HCL 0.5 % OP SOLN
OPHTHALMIC | Status: AC
  Filled 2016-07-16: qty 2

## 2016-07-16 MED ORDER — PHENYLEPHRINE HCL 10 % OP SOLN
1.0000 [drp] | OPHTHALMIC | Status: AC
  Administered 2016-07-16 (×4): 1 [drp] via OPHTHALMIC

## 2016-07-16 MED ORDER — HYALURONIDASE HUMAN 150 UNIT/ML IJ SOLN
INTRAMUSCULAR | Status: AC
  Filled 2016-07-16: qty 1

## 2016-07-16 MED ORDER — SODIUM CHLORIDE 0.9 % IV SOLN
INTRAVENOUS | Status: DC
  Administered 2016-07-16 (×2): via INTRAVENOUS

## 2016-07-16 MED ORDER — EPINEPHRINE PF 1 MG/ML IJ SOLN
INTRAMUSCULAR | Status: DC | PRN
  Administered 2016-07-16: 1 mL via OPHTHALMIC

## 2016-07-16 MED ORDER — PROPARACAINE HCL 0.5 % OP SOLN
OPHTHALMIC | Status: AC
  Filled 2016-07-16: qty 15

## 2016-07-16 MED ORDER — CYCLOPENTOLATE HCL 2 % OP SOLN
OPHTHALMIC | Status: AC
Start: 2016-07-16 — End: 2016-07-16
  Administered 2016-07-16: 1 [drp] via OPHTHALMIC
  Filled 2016-07-16: qty 2

## 2016-07-16 MED ORDER — NA CHONDROIT SULF-NA HYALURON 40-17 MG/ML IO SOLN
INTRAOCULAR | Status: DC | PRN
  Administered 2016-07-16: 1 mL via INTRAOCULAR

## 2016-07-16 SURGICAL SUPPLY — 29 items
CANNULA ANT/CHMB 27GA (MISCELLANEOUS) ×2 IMPLANT
CORD BIP STRL DISP 12FT (MISCELLANEOUS) ×2 IMPLANT
CUP MEDICINE 2OZ PLAST GRAD ST (MISCELLANEOUS) ×2 IMPLANT
DRAPE XRAY CASSETTE 23X24 (DRAPES) ×2 IMPLANT
ERASER HMR WETFIELD 18G (MISCELLANEOUS) ×2 IMPLANT
GLOVE BIO SURGEON STRL SZ8 (GLOVE) ×2 IMPLANT
GLOVE SURG LX 6.5 MICRO (GLOVE) ×1
GLOVE SURG LX 8.0 MICRO (GLOVE) ×1
GLOVE SURG LX STRL 6.5 MICRO (GLOVE) ×1 IMPLANT
GLOVE SURG LX STRL 8.0 MICRO (GLOVE) ×1 IMPLANT
GOWN STRL REUS W/ TWL LRG LVL3 (GOWN DISPOSABLE) ×1 IMPLANT
GOWN STRL REUS W/ TWL XL LVL3 (GOWN DISPOSABLE) ×1 IMPLANT
GOWN STRL REUS W/TWL LRG LVL3 (GOWN DISPOSABLE) ×1
GOWN STRL REUS W/TWL XL LVL3 (GOWN DISPOSABLE) ×1
LENS IOL RESTOR 19.5 (Intraocular Lens) ×2 IMPLANT
PACK CATARACT (MISCELLANEOUS) ×2 IMPLANT
PACK CATARACT DINGLEDEIN LX (MISCELLANEOUS) ×2 IMPLANT
PACK EYE AFTER SURG (MISCELLANEOUS) ×2 IMPLANT
SHLD EYE VISITEC  UNIV (MISCELLANEOUS) ×2 IMPLANT
SOL BSS BAG (MISCELLANEOUS) ×2
SOL PREP PVP 2OZ (MISCELLANEOUS) ×2
SOLUTION BSS BAG (MISCELLANEOUS) ×1 IMPLANT
SOLUTION PREP PVP 2OZ (MISCELLANEOUS) ×1 IMPLANT
SUT SILK 5-0 (SUTURE) ×2 IMPLANT
SYR 3ML LL SCALE MARK (SYRINGE) ×2 IMPLANT
SYR 5ML LL (SYRINGE) ×2 IMPLANT
SYR TB 1ML 27GX1/2 LL (SYRINGE) ×2 IMPLANT
WATER STERILE IRR 250ML POUR (IV SOLUTION) ×2 IMPLANT
WIPE NON LINTING 3.25X3.25 (MISCELLANEOUS) ×2 IMPLANT

## 2016-07-16 NOTE — Op Note (Signed)
Date of Surgery: 07/16/2016 Date of Dictation: 07/16/2016 11:29 AM Pre-operative Diagnosis:Nuclear Sclerotic Cataract and Cortical Cataract right Eye Post-operative Diagnosis: same Procedure performed: Extra-capsular Cataract Extraction (ECCE) with placement of a posterior chamber intraocular lens (IOL) right Eye IOL:  Implant Name Type Inv. Item Serial No. Manufacturer Lot No. LRB No. Used  acrysof restor toric lens     [id number] ALCON   Right 1   Anesthesia: 2% Lidocaine and 4% Marcaine in a 50/50 mixture with 10 unites/ml of Hylenex given as a peribulbar Anesthesiologist: Anesthesiologist: Gunnar Bulla, MD CRNA: Courtney Paris, CRNA Complications: none Estimated Blood Loss: less than 1 ml  Description of procedure:  The patient sat upright and the eyes were anesthetized with topical proparacaine. With the patient fixing at a distant target the 3 o'clock and 9 o'clock positions at the limbus were marked with an sterile indelible surgical marking pen.  The patient was given anesthesia and sedation via intravenous access. The patient was then prepped and draped in the usual fashion. A 25-gauge needle was bent to use for starting the capsulorhexis. A 5-0 silk suture was placed through the conjunctiva superior and inferiorly to serve as bridle sutures.   The [place] system was engaged and registration was performed. The positions of the incisions and the steep axis of the astigmatism were identified by the [place] system and marked with an indelible pen. The Verion heads up display was turned off.  Hemostasis was obtained at the the position of the main incision using an eraser cautery. A partial thickness groove was made at the at that location with a 64 [place] blade and this was dissected anteriorly with an [employer]aya. The anterior chamber was entered at 10 o'clock with a 1.0 mm paracentesis knife and through the lamellar dissection with a 2.6 mm Alcon keratome. Epi-Shugarcaine 0.5 CC  [9 cc BSS Plus (Alcon), 3 cc 4% preservative-free lidocaine (Hospira) and 4 cc 1:1000 preservative-free, bisulfite-free epinephrine] was injected into the anterior chamber via the paracentesis tract. DiscoVisc was injected to replace the aqueous and a continuous tear curvilinear capsulorhexis was performed using a bent 25-gauge needle.  Balance salt on a syringe was used to perform hydro-dissection and phacoemulsification was carried out using a divide and conquer technique. Procedure(s) with comments: CATARACT EXTRACTION PHACO AND INTRAOCULAR LENS PLACEMENT (IOC) (Right) - [place] 01:11 AP% 17.6 CDE 24.83 fluid pack lot # [id number]. Irrigation/aspiration was used to remove the residual cortex and the capsular bag was inflated with DiscoVisc. The intraocular lens was inserted into the capsular bag using a Monarch Delivery System cartridge. The Verion heads up display was turned on and the lens was rotated so that the marks on the base of the haptics were aligned with the steep axis of astigmatism as identified by the [place] unit.   Irrigation/aspiration was used to remove the residual DiscoVisc. The I/A hand piece was pressed down on top of the lens to prevent rotation. The wound was inflated with balanced salt and checked for leaks. None were found. The position of the Toric lens was reconfirmed using the [place] unit. Miostat was injected via the paracentesis track and 0.1 ml of cefuroxime containing 1 mg of drug was injected via the paracentesis track. The wound was checked for leaks again and none were found.   The bridal sutures were removed and two drops of Vigamox were placed on the eye. An eye shield was placed to protect the eye and the patient was discharged to the recovery area in  good condition.   Seletha Zimmermann MD @T @

## 2016-07-16 NOTE — Anesthesia Postprocedure Evaluation (Signed)
Anesthesia Post Note  Patient: Douglas [name]  Procedure(s) Performed: Procedure(s) (LRB): CATARACT EXTRACTION PHACO AND INTRAOCULAR LENS PLACEMENT (IOC) (Right)  Patient location during evaluation: PACU Anesthesia Type: MAC Level of consciousness: awake and alert Pain management: pain level controlled Vital Signs Assessment: post-procedure vital signs reviewed and stable Respiratory status: spontaneous breathing, nonlabored ventilation, respiratory function stable and patient connected to nasal cannula oxygen Cardiovascular status: stable and blood pressure returned to baseline Anesthetic complications: no     Last Vitals:  Vitals:   07/16/16 1133 07/16/16 1144  BP: 136/83 (!) 162/86  Pulse: 72 67  Resp: 16 16  Temp: 36.7 C     Last Pain:  Vitals:   07/16/16 1133  TempSrc: Oral                 Milania Haubner S

## 2016-07-16 NOTE — Transfer of Care (Signed)
Immediate Anesthesia Transfer of Care Note  Patient: Douglas [name]  Procedure(s) Performed: Procedure(s) with comments: CATARACT EXTRACTION PHACO AND INTRAOCULAR LENS PLACEMENT (IOC) (Right) - [place] 01:11 AP% 17.6 CDE 24.83 fluid pack lot # [id number]  Patient Location: PACU and Short Stay  Anesthesia Type:MAC  Level of Consciousness: awake, oriented and patient cooperative  Airway & Oxygen Therapy: Patient Spontanous Breathing  Post-op Assessment: Report given to RN and Post -op Vital signs reviewed and stable  Post vital signs: Reviewed and stable  Last Vitals:  Vitals:   07/16/16 0834  BP: (!) 150/77  Pulse: 80  Resp: 16  Temp: 37 C    Last Pain:  Vitals:   07/16/16 0834  TempSrc: Oral         Complications: No apparent anesthesia complications

## 2016-07-16 NOTE — Anesthesia Preprocedure Evaluation (Addendum)
Anesthesia Evaluation  Patient identified by MRN, date of birth, ID band Patient awake    Reviewed: Allergy & Precautions, NPO status , Patient's Chart, lab work & pertinent test results, reviewed documented beta blocker date and time   Airway Mallampati: II  TM Distance: >3 FB     Dental  (+) Chipped   Pulmonary sleep apnea ,           Cardiovascular hypertension, Pt. on medications      Neuro/Psych PSYCHIATRIC DISORDERS Depression    GI/Hepatic GERD  Controlled,  Endo/Other  diabetes, Type 2Hypothyroidism   Renal/GU Renal disease     Musculoskeletal   Abdominal   Peds  (+) ATTENTION DEFICIT DISORDER WITHOUT HYPERACTIVITY Hematology   Anesthesia Other Findings Obese. Not on CPAP. On O2 at nite 2L. Gout. No stnet placed.  Reproductive/Obstetrics                            Anesthesia Physical Anesthesia Plan  ASA: III  Anesthesia Plan: MAC   Post-op Pain Management:    Induction:   Airway Management Planned:   Additional Equipment:   Intra-op Plan:   Post-operative Plan:   Informed Consent: I have reviewed the patients History and Physical, chart, labs and discussed the procedure including the risks, benefits and alternatives for the proposed anesthesia with the patient or authorized representative who has indicated his/her understanding and acceptance.     Plan Discussed with: CRNA  Anesthesia Plan Comments:         Anesthesia Quick Evaluation

## 2016-07-16 NOTE — Interval H&P Note (Signed)
History and Physical Interval Note:  07/16/2016 10:35 AM  Douglas [name]  has presented today for surgery, with the diagnosis of CATARACT  The various methods of treatment have been discussed with the patient and family. After consideration of risks, benefits and other options for treatment, the patient has consented to  Procedure(s): CATARACT EXTRACTION PHACO AND INTRAOCULAR LENS PLACEMENT ([place]) (Right) as a surgical intervention .  The patient's history has been reviewed, patient examined, no change in status, stable for surgery.  I have reviewed the patient's chart and labs.  Questions were answered to the patient's satisfaction.     Murriel Eidem

## 2016-07-16 NOTE — Anesthesia Post-op Follow-up Note (Cosign Needed)
Anesthesia QCDR form completed.        

## 2016-07-16 NOTE — Discharge Instructions (Addendum)
Eye Surgery Discharge Instructions  Expect mild scratchy sensation or mild soreness. DO NOT RUB YOUR EYE!  The day of surgery:  Minimal physical activity, but bed rest is not required  No reading, computer work, or close hand work  No bending, lifting, or straining.  May watch TV  For 24 hours:  No driving, legal decisions, or alcoholic beverages  Safety precautions  Eat anything you prefer: It is better to start with liquids, then soup then solid foods.  _____ Eye patch should be worn until postoperative exam tomorrow.  ____ Solar shield eyeglasses should be worn for comfort in the sunlight/patch while sleeping  Resume all regular medications including aspirin or Coumadin if these were discontinued prior to surgery. You may shower, bathe, shave, or wash your hair. Tylenol may be taken for mild discomfort.  Call your doctor if you experience significant pain, nausea, or vomiting, fever > 101 or other signs of infection. [phone] or [phone] Specific instructions:  Follow-up Information    Tillmon Kisling, MD Follow up.   Specialty:  Ophthalmology Why:  Thursday 07/17/16 @ 10:10 am Contact information: [address]   [place] [address] [phone]

## 2016-07-17 ENCOUNTER — Encounter: Payer: Self-pay | Admitting: Ophthalmology

## 2016-07-28 ENCOUNTER — Other Ambulatory Visit: Payer: Self-pay | Admitting: Family Medicine

## 2016-07-28 NOTE — Telephone Encounter (Signed)
Refilled: 08/01/15 Last OV: 05/20/16 Last Labs: 11/15/15 Future OV: 08/18/16 Please advise?

## 2016-07-29 MED ORDER — FUROSEMIDE 40 MG PO TABS: 40.0000 mg | ORAL_TABLET | Freq: Every day | ORAL | 3 refills | Status: DC

## 2016-07-30 DIAGNOSIS — H2512 Age-related nuclear cataract, left eye: Secondary | ICD-10-CM | POA: Diagnosis not present

## 2016-08-07 ENCOUNTER — Encounter: Payer: Self-pay | Admitting: *Deleted

## 2016-08-12 NOTE — H&P (Signed)
See scanned note.

## 2016-08-13 ENCOUNTER — Ambulatory Visit: Payer: Medicare Other | Admitting: Anesthesiology

## 2016-08-13 ENCOUNTER — Ambulatory Visit
Admission: RE | Admit: 2016-08-13 | Discharge: 2016-08-13 | Disposition: A | Discharge status: Home / Self Care | Payer: Medicare Other | Source: Ambulatory Visit | Attending: Ophthalmology | Admitting: Ophthalmology

## 2016-08-13 ENCOUNTER — Encounter
Admission: RE | Disposition: A | Discharge status: Home / Self Care | Payer: Self-pay | Source: Ambulatory Visit | Attending: Ophthalmology

## 2016-08-13 DIAGNOSIS — I10 Essential (primary) hypertension: Secondary | ICD-10-CM | POA: Diagnosis not present

## 2016-08-13 DIAGNOSIS — E039 Hypothyroidism, unspecified: Secondary | ICD-10-CM | POA: Insufficient documentation

## 2016-08-13 DIAGNOSIS — H2512 Age-related nuclear cataract, left eye: Secondary | ICD-10-CM | POA: Diagnosis not present

## 2016-08-13 DIAGNOSIS — G4730 Sleep apnea, unspecified: Secondary | ICD-10-CM | POA: Diagnosis not present

## 2016-08-13 DIAGNOSIS — F909 Attention-deficit hyperactivity disorder, unspecified type: Secondary | ICD-10-CM | POA: Insufficient documentation

## 2016-08-13 DIAGNOSIS — M1990 Unspecified osteoarthritis, unspecified site: Secondary | ICD-10-CM | POA: Diagnosis not present

## 2016-08-13 DIAGNOSIS — M109 Gout, unspecified: Secondary | ICD-10-CM | POA: Diagnosis not present

## 2016-08-13 DIAGNOSIS — E119 Type 2 diabetes mellitus without complications: Secondary | ICD-10-CM | POA: Diagnosis not present

## 2016-08-13 DIAGNOSIS — Z6839 Body mass index (BMI) 39.0-39.9, adult: Secondary | ICD-10-CM | POA: Diagnosis not present

## 2016-08-13 DIAGNOSIS — Z79899 Other long term (current) drug therapy: Secondary | ICD-10-CM | POA: Insufficient documentation

## 2016-08-13 DIAGNOSIS — N289 Disorder of kidney and ureter, unspecified: Secondary | ICD-10-CM | POA: Insufficient documentation

## 2016-08-13 DIAGNOSIS — E1136 Type 2 diabetes mellitus with diabetic cataract: Secondary | ICD-10-CM | POA: Diagnosis not present

## 2016-08-13 DIAGNOSIS — H2511 Age-related nuclear cataract, right eye: Secondary | ICD-10-CM | POA: Diagnosis not present

## 2016-08-13 DIAGNOSIS — E669 Obesity, unspecified: Secondary | ICD-10-CM | POA: Insufficient documentation

## 2016-08-13 DIAGNOSIS — K219 Gastro-esophageal reflux disease without esophagitis: Secondary | ICD-10-CM | POA: Insufficient documentation

## 2016-08-13 DIAGNOSIS — F329 Major depressive disorder, single episode, unspecified: Secondary | ICD-10-CM | POA: Insufficient documentation

## 2016-08-13 DIAGNOSIS — Z9981 Dependence on supplemental oxygen: Secondary | ICD-10-CM | POA: Insufficient documentation

## 2016-08-13 HISTORY — DX: Unspecified osteoarthritis, unspecified site: M19.90

## 2016-08-13 HISTORY — PX: CATARACT EXTRACTION W/PHACO: SHX586

## 2016-08-13 LAB — GLUCOSE, CAPILLARY: Glucose-Capillary: 158 mg/dL — ABNORMAL HIGH (ref 65–99)

## 2016-08-13 SURGERY — PHACOEMULSIFICATION, CATARACT, WITH IOL INSERTION
Anesthesia: Monitor Anesthesia Care | Site: Eye | Laterality: Left | Wound class: Clean

## 2016-08-13 MED ORDER — MIDAZOLAM HCL 5 MG/5ML IJ SOLN
INTRAMUSCULAR | Status: AC
  Filled 2016-08-13: qty 5

## 2016-08-13 MED ORDER — HYALURONIDASE HUMAN 150 UNIT/ML IJ SOLN
INTRAMUSCULAR | Status: AC
  Filled 2016-08-13: qty 1

## 2016-08-13 MED ORDER — MOXIFLOXACIN HCL 0.5 % OP SOLN
1.0000 [drp] | OPHTHALMIC | Status: AC
  Administered 2016-08-13 (×3): 1 [drp] via OPHTHALMIC

## 2016-08-13 MED ORDER — ALFENTANIL 500 MCG/ML IJ INJ
INJECTION | INTRAVENOUS | Status: DC | PRN
  Administered 2016-08-13: 1000 ug via INTRAVENOUS

## 2016-08-13 MED ORDER — CARBACHOL 0.01 % IO SOLN
INTRAOCULAR | Status: DC | PRN
  Administered 2016-08-13: .5 mL via INTRAOCULAR

## 2016-08-13 MED ORDER — LIDOCAINE HCL (PF) 4 % IJ SOLN
INTRAMUSCULAR | Status: AC
  Filled 2016-08-13: qty 5

## 2016-08-13 MED ORDER — CYCLOPENTOLATE HCL 2 % OP SOLN
OPHTHALMIC | Status: AC
  Filled 2016-08-13: qty 2

## 2016-08-13 MED ORDER — CYCLOPENTOLATE HCL 2 % OP SOLN
1.0000 [drp] | OPHTHALMIC | Status: AC
  Administered 2016-08-13 (×4): 1 [drp] via OPHTHALMIC

## 2016-08-13 MED ORDER — SODIUM CHLORIDE 0.9 % IV SOLN
INTRAVENOUS | Status: DC
  Administered 2016-08-13: 06:00:00 via INTRAVENOUS

## 2016-08-13 MED ORDER — MOXIFLOXACIN HCL 0.5 % OP SOLN
OPHTHALMIC | Status: AC
  Filled 2016-08-13: qty 3

## 2016-08-13 MED ORDER — TETRACAINE HCL 0.5 % OP SOLN
OPHTHALMIC | Status: AC
  Filled 2016-08-13: qty 2

## 2016-08-13 MED ORDER — NA CHONDROIT SULF-NA HYALURON 40-17 MG/ML IO SOLN
INTRAOCULAR | Status: DC | PRN
  Administered 2016-08-13: 1 mL via INTRAOCULAR

## 2016-08-13 MED ORDER — NA CHONDROIT SULF-NA HYALURON 40-17 MG/ML IO SOLN
INTRAOCULAR | Status: AC
  Filled 2016-08-13: qty 1

## 2016-08-13 MED ORDER — FENTANYL CITRATE (PF) 100 MCG/2ML IJ SOLN
INTRAMUSCULAR | Status: AC
  Filled 2016-08-13: qty 2

## 2016-08-13 MED ORDER — POVIDONE-IODINE 5 % OP SOLN
OPHTHALMIC | Status: AC
  Filled 2016-08-13: qty 30

## 2016-08-13 MED ORDER — EPINEPHRINE PF 1 MG/ML IJ SOLN
INTRAMUSCULAR | Status: AC
  Filled 2016-08-13: qty 2

## 2016-08-13 MED ORDER — MOXIFLOXACIN HCL 0.5 % OP SOLN
OPHTHALMIC | Status: DC | PRN
  Administered 2016-08-13: 1 [drp] via OPHTHALMIC

## 2016-08-13 MED ORDER — PHENYLEPHRINE HCL 10 % OP SOLN
OPHTHALMIC | Status: AC
  Administered 2016-08-13: 1 [drp] via OPHTHALMIC
  Filled 2016-08-13: qty 5

## 2016-08-13 MED ORDER — CEFUROXIME OPHTHALMIC INJECTION 1 MG/0.1 ML
INJECTION | OPHTHALMIC | Status: AC
  Filled 2016-08-13: qty 0.1

## 2016-08-13 MED ORDER — BUPIVACAINE HCL (PF) 0.75 % IJ SOLN
INTRAMUSCULAR | Status: AC
  Filled 2016-08-13: qty 10

## 2016-08-13 MED ORDER — TETRACAINE HCL 0.5 % OP SOLN
OPHTHALMIC | Status: DC | PRN
  Administered 2016-08-13: 1 [drp] via OPHTHALMIC

## 2016-08-13 MED ORDER — EPINEPHRINE PF 1 MG/ML IJ SOLN
INTRAOCULAR | Status: DC | PRN
  Administered 2016-08-13: 1 mL via OPHTHALMIC

## 2016-08-13 MED ORDER — PHENYLEPHRINE HCL 10 % OP SOLN
1.0000 [drp] | OPHTHALMIC | Status: AC
  Administered 2016-08-13 (×4): 1 [drp] via OPHTHALMIC

## 2016-08-13 MED ORDER — LIDOCAINE HCL (PF) 4 % IJ SOLN
INTRAOCULAR | Status: DC | PRN
  Administered 2016-08-13: 2 mL via OPHTHALMIC

## 2016-08-13 MED ORDER — CEFUROXIME OPHTHALMIC INJECTION 1 MG/0.1 ML
INJECTION | OPHTHALMIC | Status: DC | PRN
  Administered 2016-08-13: .1 mL via INTRACAMERAL

## 2016-08-13 MED ORDER — POVIDONE-IODINE 5 % OP SOLN
OPHTHALMIC | Status: DC | PRN
  Administered 2016-08-13: 1 via OPHTHALMIC

## 2016-08-13 MED ORDER — PROPARACAINE HCL 0.5 % OP SOLN
OPHTHALMIC | Status: DC | PRN
  Administered 2016-08-13: 1 [drp] via OPHTHALMIC

## 2016-08-13 MED ORDER — PHENYLEPHRINE HCL 10 % OP SOLN
OPHTHALMIC | Status: AC
  Filled 2016-08-13: qty 5

## 2016-08-13 MED ORDER — PROPOFOL 10 MG/ML IV BOLUS
INTRAVENOUS | Status: AC
  Filled 2016-08-13: qty 20

## 2016-08-13 MED ORDER — LIDOCAINE HCL (PF) 4 % IJ SOLN
INTRAMUSCULAR | Status: DC | PRN
  Administered 2016-08-13: 4 mL via OPHTHALMIC

## 2016-08-13 SURGICAL SUPPLY — 31 items
CANNULA ANT/CHMB 27GA (MISCELLANEOUS) ×2 IMPLANT
CORD BIP STRL DISP 12FT (MISCELLANEOUS) ×2 IMPLANT
CUP MEDICINE 2OZ PLAST GRAD ST (MISCELLANEOUS) ×2 IMPLANT
DRAPE XRAY CASSETTE 23X24 (DRAPES) ×2 IMPLANT
ERASER HMR WETFIELD 18G (MISCELLANEOUS) ×2 IMPLANT
GLOVE BIO SURGEON STRL SZ8 (GLOVE) ×2 IMPLANT
GLOVE SURG LX 6.5 MICRO (GLOVE) ×1
GLOVE SURG LX 8.0 MICRO (GLOVE) ×1
GLOVE SURG LX STRL 6.5 MICRO (GLOVE) ×1 IMPLANT
GLOVE SURG LX STRL 8.0 MICRO (GLOVE) ×1 IMPLANT
GOWN STRL REUS W/ TWL LRG LVL3 (GOWN DISPOSABLE) ×1 IMPLANT
GOWN STRL REUS W/ TWL XL LVL3 (GOWN DISPOSABLE) ×1 IMPLANT
GOWN STRL REUS W/TWL LRG LVL3 (GOWN DISPOSABLE) ×1
GOWN STRL REUS W/TWL XL LVL3 (GOWN DISPOSABLE) ×1
LENS IOL RESTOR  20.5 ×1 IMPLANT
LENS IOL RESTOR 20.5 ×1 IMPLANT
PACK CATARACT (MISCELLANEOUS) ×2 IMPLANT
PACK CATARACT DINGLEDEIN LX (MISCELLANEOUS) ×2 IMPLANT
PACK EYE AFTER SURG (MISCELLANEOUS) ×2 IMPLANT
SHLD EYE VISITEC  UNIV (MISCELLANEOUS) ×2 IMPLANT
SOL BSS BAG (MISCELLANEOUS) ×2
SOL PREP PVP 2OZ (MISCELLANEOUS) ×2
SOLUTION BSS BAG (MISCELLANEOUS) ×1 IMPLANT
SOLUTION PREP PVP 2OZ (MISCELLANEOUS) ×1 IMPLANT
SUT ETHILON 10 0 CS140 6 (SUTURE) ×2 IMPLANT
SUT SILK 5-0 (SUTURE) ×2 IMPLANT
SYR 3ML LL SCALE MARK (SYRINGE) ×2 IMPLANT
SYR 5ML LL (SYRINGE) ×2 IMPLANT
SYR TB 1ML 27GX1/2 LL (SYRINGE) ×2 IMPLANT
WATER STERILE IRR 250ML POUR (IV SOLUTION) ×2 IMPLANT
WIPE NON LINTING 3.25X3.25 (MISCELLANEOUS) ×2 IMPLANT

## 2016-08-13 NOTE — Transfer of Care (Signed)
Immediate Anesthesia Transfer of Care Note  Patient: Douglas [name]  Procedure(s) Performed: Procedure(s) with comments: CATARACT EXTRACTION PHACO AND INTRAOCULAR LENS PLACEMENT ([place]) suture placed in left eye at end of procedure (Left) - [place] 01:55 AP% 22.7 CDE 53.56 fluid pack lot # [id number]  Patient Location: PACU  Anesthesia Type:MAC  Level of Consciousness: awake, alert , oriented and patient cooperative  Airway & Oxygen Therapy: Patient Spontanous Breathing  Post-op Assessment: Report given to RN and Post -op Vital signs reviewed and stable  Post vital signs: Reviewed and stable  Last Vitals:  Vitals:   08/13/16 0604 08/13/16 0841  BP: (!) 144/76 (!) 161/89  Pulse: 72 64  Resp: 18   Temp: 36.2 C 36.3 C    Last Pain:  Vitals:   08/13/16 0609  TempSrc:   PainSc: 4          Complications: No apparent anesthesia complications

## 2016-08-13 NOTE — Anesthesia Postprocedure Evaluation (Signed)
Anesthesia Post Note  Patient: Douglas [name]  Procedure(s) Performed: Procedure(s) (LRB): CATARACT EXTRACTION PHACO AND INTRAOCULAR LENS PLACEMENT (IOC) suture placed in left eye at end of procedure (Left)  Anesthesia Type: MAC     Last Vitals:  Vitals:   08/13/16 0604 08/13/16 0841  BP: (!) 144/76 (!) 161/89  Pulse: 72 64  Resp: 18   Temp: 36.2 C 36.3 C    Last Pain:  Vitals:   08/13/16 0609  TempSrc:   PainSc: 4                  Bernardo Heater

## 2016-08-13 NOTE — Anesthesia Post-op Follow-up Note (Cosign Needed)
Anesthesia QCDR form completed.        

## 2016-08-13 NOTE — Interval H&P Note (Signed)
History and Physical Interval Note:  08/13/2016 7:25 AM  Douglas [name]  has presented today for surgery, with the diagnosis of CATARACT  The various methods of treatment have been discussed with the patient and family. After consideration of risks, benefits and other options for treatment, the patient has consented to  Procedure(s): CATARACT EXTRACTION PHACO AND INTRAOCULAR LENS PLACEMENT ([place]) (Left) as a surgical intervention .  The patient's history has been reviewed, patient examined, no change in status, stable for surgery.  I have reviewed the patient's chart and labs.  Questions were answered to the patient's satisfaction.     Md Smola

## 2016-08-13 NOTE — Op Note (Signed)
Date of Surgery: 08/13/2016 Date of Dictation: 08/13/2016 8:39 AM Pre-operative Diagnosis:Nuclear Sclerotic Cataract and Cortical Cataract left Eye Post-operative Diagnosis: same Procedure performed: Extra-capsular Cataract Extraction (ECCE) with placement of a posterior chamber intraocular lens (IOL) left Eye IOL:  Implant Name Type Inv. Item Serial No. Manufacturer Lot No. LRB No. Used  LENS IOL RESTORE  20.5 - [id number]   LENS IOL RESTORE  20.5 [id number] ALCON   Left 1   Anesthesia: 2% Lidocaine and 4% Marcaine in a 50/50 mixture with 10 unites/ml of Hylenex given as a peribulbar Anesthesiologist: Anesthesiologist: Andria Frames, MD CRNA: Bernardo Heater, CRNA Complications: none Estimated Blood Loss: less than 1 ml  Description of procedure:  The patient sat upright and the eyes were anesthetized with topical proparacaine. With the patient fixing at a distant target the 3 o'clock and 9 o'clock positions at the limbus were marked with an sterile indelible surgical marking pen.  The patient was given anesthesia and sedation via intravenous access. The patient was then prepped and draped in the usual fashion. A 25-gauge needle was bent to use for starting the capsulorhexis. A 5-0 silk suture was placed through the conjunctiva superior and inferiorly to serve as bridle sutures.   The [place] system was engaged and registration was performed. The positions of the incisions and the steep axis of the astigmatism were identified by the [place] system and marked with an indelible pen. The Verion heads up display was turned off.  Hemostasis was obtained at the the position of the main incision using an eraser cautery. A partial thickness groove was made at the at that location with a 64 [place] blade and this was dissected anteriorly with an Avaya. The anterior chamber was entered at 10 o'clock with a 1.0 mm paracentesis knife and through the lamellar dissection with a 2.6 mm Alcon  keratome. Epi-Shugarcaine 0.5 CC [9 cc BSS Plus (Alcon), 3 cc 4% preservative-free lidocaine ([place]) and 4 cc 1:1000 preservative-free, bisulfite-free epinephrine] was injected into the anterior chamber via the paracentesis tract. DiscoVisc was injected to replace the aqueous and a continuous tear curvilinear capsulorhexis was performed using a bent 25-gauge needle.  Balance salt on a syringe was used to perform hydro-dissection and phacoemulsification was carried out using a divide and conquer technique. Procedure(s) with comments: CATARACT EXTRACTION PHACO AND INTRAOCULAR LENS PLACEMENT (IOC) suture placed in left eye at end of procedure (Left) - [place] 01:55 AP% 22.7 CDE 53.56 fluid pack lot # [id number]. Irrigation/aspiration was used to remove the residual cortex and the capsular bag was inflated with DiscoVisc. The intraocular lens was inserted into the capsular bag using a Monarch Delivery System cartridge. The Verion heads up display was turned on and the lens was rotated so that the marks on the base of the haptics were aligned with the steep axis of astigmatism as identified by the Amaya unit.   Irrigation/aspiration was used to remove the residual DiscoVisc. The I/A hand piece was pressed down on top of the lens to prevent rotation. The wound was inflated with balanced salt and checked for leaks. None were found. The position of the Toric lens was reconfirmed using the [place] unit. Miostat was injected via the paracentesis track and 0.1 ml of cefuroxime containing 1 mg of drug was injected via the paracentesis track. A slow lead in the wound was sealed with a single 10-0 nylon suture. The wound was checked for leaks again and none were found.   The bridal sutures  were removed and two drops of Vigamox were placed on the eye. An eye shield was placed to protect the eye and the patient was discharged to the recovery area in good condition.   Douglas Matsuo MD @T @

## 2016-08-13 NOTE — Anesthesia Postprocedure Evaluation (Signed)
Anesthesia Post Note  Patient: Derrek L Fuson  Procedure(s) Performed: Procedure(s) (LRB): CATARACT EXTRACTION PHACO AND INTRAOCULAR LENS PLACEMENT ([place]) suture placed in left eye at end of procedure (Left)  Patient location during evaluation: PACU Anesthesia Type: MAC Level of consciousness: awake and alert Pain management: pain level controlled Vital Signs Assessment: post-procedure vital signs reviewed and stable Respiratory status: spontaneous breathing, nonlabored ventilation, respiratory function stable and patient connected to nasal cannula oxygen Cardiovascular status: stable and blood pressure returned to baseline Anesthetic complications: no     Last Vitals:  Vitals:   08/13/16 0841 08/13/16 0856  BP: (!) 161/89 (!) 166/87  Pulse: 64 64  Resp:    Temp: 36.3 C     Last Pain:  Vitals:   08/13/16 0609  TempSrc:   PainSc: 4                  Precious Haws Nakeyia Menden

## 2016-08-13 NOTE — Anesthesia Preprocedure Evaluation (Signed)
Anesthesia Evaluation  Patient identified by MRN, date of birth, ID band Patient awake    Reviewed: Allergy & Precautions, NPO status , Patient's Chart, lab work & pertinent test results, reviewed documented beta blocker date and time   Airway Mallampati: II  TM Distance: >3 FB     Dental  (+) Chipped   Pulmonary sleep apnea ,           Cardiovascular hypertension, Pt. on medications      Neuro/Psych PSYCHIATRIC DISORDERS Depression    GI/Hepatic GERD  Controlled,  Endo/Other  diabetes, Type 2Hypothyroidism   Renal/GU Renal disease     Musculoskeletal  (+) Arthritis ,   Abdominal   Peds  (+) ATTENTION DEFICIT DISORDER WITHOUT HYPERACTIVITY Hematology   Anesthesia Other Findings Obese. Not on CPAP. On O2 at nite 2L. Gout. No stnet placed.  Reproductive/Obstetrics                             Anesthesia Physical  Anesthesia Plan  ASA: III  Anesthesia Plan: MAC   Post-op Pain Management:    Induction:   Airway Management Planned:   Additional Equipment:   Intra-op Plan:   Post-operative Plan:   Informed Consent: I have reviewed the patients History and Physical, chart, labs and discussed the procedure including the risks, benefits and alternatives for the proposed anesthesia with the patient or authorized representative who has indicated his/her understanding and acceptance.     Plan Discussed with: CRNA  Anesthesia Plan Comments:         Anesthesia Quick Evaluation

## 2016-08-13 NOTE — Discharge Instructions (Addendum)
See hand out.

## 2016-08-18 ENCOUNTER — Ambulatory Visit (INDEPENDENT_AMBULATORY_CARE_PROVIDER_SITE_OTHER): Payer: Medicare Other | Admitting: Family Medicine

## 2016-08-18 ENCOUNTER — Encounter: Payer: Self-pay | Admitting: Family Medicine

## 2016-08-18 VITALS — BP 144/86 | HR 83 | Temp 98.3°F | Wt 291.2 lb

## 2016-08-18 DIAGNOSIS — E785 Hyperlipidemia, unspecified: Secondary | ICD-10-CM

## 2016-08-18 DIAGNOSIS — Z862 Personal history of diseases of the blood and blood-forming organs and certain disorders involving the immune mechanism: Secondary | ICD-10-CM | POA: Diagnosis not present

## 2016-08-18 DIAGNOSIS — I10 Essential (primary) hypertension: Secondary | ICD-10-CM

## 2016-08-18 DIAGNOSIS — E118 Type 2 diabetes mellitus with unspecified complications: Secondary | ICD-10-CM

## 2016-08-18 DIAGNOSIS — M1710 Unilateral primary osteoarthritis, unspecified knee: Secondary | ICD-10-CM | POA: Insufficient documentation

## 2016-08-18 DIAGNOSIS — M170 Bilateral primary osteoarthritis of knee: Secondary | ICD-10-CM | POA: Diagnosis not present

## 2016-08-18 DIAGNOSIS — E039 Hypothyroidism, unspecified: Secondary | ICD-10-CM | POA: Diagnosis not present

## 2016-08-18 DIAGNOSIS — M179 Osteoarthritis of knee, unspecified: Secondary | ICD-10-CM | POA: Insufficient documentation

## 2016-08-18 DIAGNOSIS — N400 Enlarged prostate without lower urinary tract symptoms: Secondary | ICD-10-CM

## 2016-08-18 LAB — COMPREHENSIVE METABOLIC PANEL
ALT: 36 U/L (ref 0–53)
AST: 27 U/L (ref 0–37)
Albumin: 4.2 g/dL (ref 3.5–5.2)
Alkaline Phosphatase: 72 U/L (ref 39–117)
BUN: 21 mg/dL (ref 6–23)
CO2: 32 mEq/L (ref 19–32)
Calcium: 9.8 mg/dL (ref 8.4–10.5)
Chloride: 101 mEq/L (ref 96–112)
Creatinine, Ser: 1.47 mg/dL (ref 0.40–1.50)
GFR: 50.16 mL/min — ABNORMAL LOW (ref >60.00)
Glucose, Bld: 224 mg/dL — ABNORMAL HIGH (ref 70–99)
Potassium: 4.3 mEq/L (ref 3.5–5.1)
Sodium: 138 mEq/L (ref 135–145)
Total Bilirubin: 0.6 mg/dL (ref 0.2–1.2)
Total Protein: 7 g/dL (ref 6.0–8.3)

## 2016-08-18 LAB — CBC
HCT: 39.9 % (ref 39.0–52.0)
Hemoglobin: 13.1 g/dL (ref 13.0–17.0)
MCHC: 32.8 g/dL (ref 30.0–36.0)
MCV: 88.3 fl (ref 78.0–100.0)
Platelets: 202 10*3/uL (ref 150.0–400.0)
RBC: 4.51 Mil/uL (ref 4.22–5.81)
RDW: 15.9 % — ABNORMAL HIGH (ref 11.5–15.5)
WBC: 5.7 10*3/uL (ref 4.0–10.5)

## 2016-08-18 LAB — LIPID PANEL
Cholesterol: 132 mg/dL (ref 0–200)
HDL: 46.7 mg/dL (ref >39.00)
LDL Cholesterol: 57 mg/dL (ref 0–99)
NonHDL: 85.73
Total CHOL/HDL Ratio: 3
Triglycerides: 143 mg/dL (ref 0.0–149.0)
VLDL: 28.6 mg/dL (ref 0.0–40.0)

## 2016-08-18 LAB — HEMOGLOBIN A1C: Hgb A1c MFr Bld: 8 % — ABNORMAL HIGH (ref 4.6–6.5)

## 2016-08-18 LAB — TSH: TSH: 3.63 u[IU]/mL (ref 0.35–4.50)

## 2016-08-18 MED ORDER — TAMSULOSIN HCL 0.4 MG PO CAPS: 0.4000 mg | ORAL_CAPSULE | Freq: Every day | ORAL | 1 refills | Status: DC

## 2016-08-18 NOTE — Patient Instructions (Signed)
I will call about the lab results and about the braces.  Follow up in 3-6 months.  Take care  Dr. Lacinda Axon

## 2016-08-18 NOTE — Assessment & Plan Note (Signed)
Will ask SM about bracing recommendations.

## 2016-08-18 NOTE — Assessment & Plan Note (Signed)
Stopping Cardura. Starting Flomax.

## 2016-08-18 NOTE — Progress Notes (Signed)
Pre visit review using our clinic review tool, if applicable. No additional management support is needed unless otherwise documented below in the visit note. 

## 2016-08-18 NOTE — Assessment & Plan Note (Signed)
Recent reports of low BP's. Stopping Cardura. Continue Losartan and Lasix.

## 2016-08-18 NOTE — Assessment & Plan Note (Signed)
A1C today. 

## 2016-08-18 NOTE — Assessment & Plan Note (Signed)
At goal on Zocor. Continue. Labs today.

## 2016-08-18 NOTE — Progress Notes (Signed)
Subjective:  Patient ID: Douglas [name], male    DOB: 10/08/1945  Age: 71 y.o. MRN: [id number]  CC: Follow up  HPI:  71 year old male with DM 2, diabetic neuropathy, hypertension, hyperlipidemia, hypothyroidism presents for follow-up.  Hypertension  Patient reports that he's had some low blood pressures.  He states that he recently had a presyncopal episode at a restaurant. It resolved quickly. He did not check his sugar and is unsure of what the reasoning for this was. He was concerned that this was from his low pressure being low.  He is currently taking Lasix, Cardura, losartan.  Hyperlipidemia  At goal on Simvastatin.  Needs labs.  DM-2  Patient reports that he's had some elevated blood sugars recently secondary to dietary indiscretion.  Is currently on Amaryl (only).   Needs A1c today.  Osteoarthritis  Patient reports a history of osteoarthritis in both knees.  Patient states that this is quite severe.  He does not want medication or surgery.  He is interested in bracing.  Social Hx   Social History   Social History  . Marital status: Married    Spouse name: [name]  . Number of children: 3  . Years of education: 20   Occupational History  .      retired   Social History Main Topics  . Smoking status: Never Smoker  . Smokeless tobacco: Never Used  . Alcohol use No  . Drug use: No  . Sexual activity: Yes   Other Topics Concern  . None   Social History Narrative   Lives in [place] with his wife ([name]). No children. Three step children. No pets.      Work - Patient is retired. Maintenance supervisor.      School - One year college education.      Right handed.      [place], served in [place], no combat                Review of Systems  Constitutional: Negative.   Musculoskeletal: Positive for arthralgias.  Neurological: Positive for dizziness.   Objective:  BP (!) 144/86   Pulse 83   Temp 98.3 F  (36.8 C) (Oral)   Wt 291 lb 4 oz (132.1 kg)   SpO2 92%   BMI 40.62 kg/m   BP/Weight 08/18/2016 04/19/5788 06/19/3336  Systolic BP 329 191 660  Diastolic BP 86 87 86  Wt. (Lbs) 291.25 281 281  BMI 40.62 39.19 39.19    Physical Exam  Constitutional: He is oriented to person, place, and time. He appears well-developed. No distress.  Cardiovascular: Normal rate and regular rhythm.   Pulmonary/Chest: Effort normal and breath sounds normal.  Neurological: He is alert and oriented to person, place, and time.  Psychiatric: He has a normal mood and affect.  Vitals reviewed.   Lab Results  Component Value Date   WBC 8.0 06/07/2015   HGB 13.6 06/07/2015   HCT 41.2 06/07/2015   PLT 190 06/07/2015   GLUCOSE 104 (H) 11/15/2015   CHOL 130 08/30/2015   TRIG 137.0 08/30/2015   HDL 38.60 (L) 08/30/2015   LDLCALC 64 08/30/2015   ALT 20 11/15/2015   AST 22 11/15/2015   NA 137 11/15/2015   K 3.8 11/15/2015   CL 99 11/15/2015   CREATININE 1.33 11/15/2015   BUN 16 11/15/2015   CO2 32 11/15/2015   TSH 2.16 11/15/2015   PSA 0.81 01/27/2015   HGBA1C 6.3 11/15/2015   MICROALBUR <  0.7 08/30/2015    Assessment & Plan:   Problem List Items Addressed This Visit    BPH (benign prostatic hyperplasia)    Stopping Cardura. Starting Flomax.       Relevant Medications   tamsulosin (FLOMAX) 0.4 MG CAPS capsule   Controlled diabetes mellitus type 2 with complications (HCC)    H6W today.      Relevant Orders   Hemoglobin A1c   Comprehensive metabolic panel   Essential hypertension - Primary    Recent reports of low BP's. Stopping Cardura. Continue Losartan and Lasix.       Hyperlipidemia    At goal on Zocor. Continue. Labs today.      Relevant Orders   Lipid panel   Hypothyroidism (Chronic)   Relevant Orders   TSH   Knee osteoarthritis    Will ask SM about bracing recommendations.       Other Visit Diagnoses    History of anemia       Relevant Orders   CBC     Meds ordered  this encounter  Medications  . tamsulosin (FLOMAX) 0.4 MG CAPS capsule    Sig: Take 1 capsule (0.4 mg total) by mouth daily.    Dispense:  90 capsule    Refill:  1   Follow-up: 3-6 months  [place]a [place]

## 2016-08-19 ENCOUNTER — Telehealth: Payer: Self-pay

## 2016-08-19 ENCOUNTER — Other Ambulatory Visit: Payer: Self-pay | Admitting: Family Medicine

## 2016-08-19 DIAGNOSIS — M170 Bilateral primary osteoarthritis of knee: Secondary | ICD-10-CM

## 2016-08-19 NOTE — Telephone Encounter (Signed)
A voicemail was left for pt to callback.

## 2016-08-19 NOTE — Telephone Encounter (Signed)
-----   Message from Coral Spikes, DO sent at 08/18/2016 10:37 PM EDT ----- Please inform patient of message below from Sports [facility] ----- Message ----- From: Lyndal Pulley, DO Sent: 08/18/2016   2:52 PM To: Coral Spikes, DO  I use [facility] They come to my office and they do everything, patients are very happy with them usually and usually actually fairly good price.  Send him my way and I can get it done quick.  Just opened up Friday and should have some spots still.  Thanks ----- Message ----- From: Coral Spikes, DO Sent: 08/18/2016   2:45 PM To: Lyndal Pulley, DO  Need recommendations for bracing for a patient with severe knee OA. He states that he wants something sturdy, not just a sleeve. He does not want meds, injections, or surgery for the OA. He just desires bracing. Any recommendations/suggestions?  Thanks [place]

## 2016-08-19 NOTE — Telephone Encounter (Signed)
Pt ok with referral to sports med.

## 2016-08-20 ENCOUNTER — Ambulatory Visit: Payer: Medicare Other | Admitting: Family Medicine

## 2016-09-01 ENCOUNTER — Ambulatory Visit (INDEPENDENT_AMBULATORY_CARE_PROVIDER_SITE_OTHER): Payer: Medicare Other | Admitting: Pharmacist

## 2016-09-01 ENCOUNTER — Encounter: Payer: Self-pay | Admitting: Pharmacist

## 2016-09-01 DIAGNOSIS — I10 Essential (primary) hypertension: Secondary | ICD-10-CM

## 2016-09-01 DIAGNOSIS — E118 Type 2 diabetes mellitus with unspecified complications: Secondary | ICD-10-CM

## 2016-09-01 DIAGNOSIS — E1140 Type 2 diabetes mellitus with diabetic neuropathy, unspecified: Secondary | ICD-10-CM

## 2016-09-01 MED ORDER — METFORMIN HCL ER 500 MG PO TB24: 500.0000 mg | ORAL_TABLET | Freq: Two times a day (BID) | ORAL | 1 refills | Status: DC

## 2016-09-01 MED ORDER — LIDOCAINE 5 % EX PTCH: 1.0000 | MEDICATED_PATCH | CUTANEOUS | 1 refills | Status: DC

## 2016-09-01 NOTE — Assessment & Plan Note (Signed)
Diabetic neuropathy currently uncontrolled on pregabalin and duloxetine.  -Started lidocaine patches daily at bedtime.  Instructed patient to not cover them with socks/shoes and to cut them and apply on each foot.  -Will decrease Lyrica to 150 mg twice daily

## 2016-09-01 NOTE — Assessment & Plan Note (Signed)
Diabetes longstanding diagnosed currently above goal of <7. Patient denies hypoglycemic events and is able to verbalize appropriate hypoglycemia management plan. Patient reports adherence with medication. Control is suboptimal due to non-optomized regimen and diet. Following discussion and approval by Dr Lacinda Axon, the following medication changes were made:  -Continue glimepiride 2 mg BID -Started Metformin XR 500 mg daily x 1 week then increase to 500 mg BID.  Discussed side effects including diarrhea and efficacy -Discussed low carbohydrate diet -Instructed patient to check CBGs fasting and 2 hours post prandial  Next A1C anticipated 11/2016.

## 2016-09-01 NOTE — Patient Instructions (Signed)
Start metformin xr 500 mg daily for 1 week then increase to 500 mg twice daily  Start lidocaine patches once daily on your feet while sleeping.  You may cut the patches.  Please do not cover them with your socks or shoes  Followup with Bennye Alm, PharmD in 1 month

## 2016-09-01 NOTE — Progress Notes (Signed)
Care was provided under my supervision. I agree with the management as indicated in the note.  Callum Wolf DO  

## 2016-09-01 NOTE — Assessment & Plan Note (Signed)
Hypertension longstanding diagnosed currently controlled with patient experiencing dizziness/orthostasis upon rising potentially related to doxazosin or benzodiazepine.  Patient reports adherence with medication. -Continue current medications -Consider tapering of BZD or doxazosin if orthostasis/dizziness worsens

## 2016-09-01 NOTE — Progress Notes (Signed)
S:    Chief Complaint  Patient presents with  . Medication Management    Diabetes   Patient arrives in good spirits ambulating without assistance.  Presents for diabetes evaluation, education, and management at the request of Dr Lacinda Axon. Patient was referred on 08/19/16 (lab note).  Patient was last seen by Primary Care Provider on 08/18/16.   Patient reports Diabetes was diagnosed in 2014.   Family/Social History: Father (MI at 66), Brother (MI at 76), Brother (died esophageal cancer at 60)  Patient reports adherence with medications.  Current diabetes medications include: glimepiride 2 mg BID Has been on Glimepiride 4-5 years.  Was previously on metformin but was stopped 2/2 AKI.   Current hypertension medications include: losartan 50 mg daily, doxazosin 8 mg QHS   Patient denies hypoglycemic events.  Patient reported dietary habits:  Has been fixing food for his grandkids and has decreased salads.  Also eating occasional sweets.  Has had surgery on his eyes. Has been trying to restart his diet.  Drinks water and little soda.    Patient reported exercise habits: Walking (limited by knees)   Patient reports nocturia 1x/night.  Patient reports slight blurred vision/visual changes. Had cataract surgery April and May.  Patient reports self foot exams. Denies changes  Reports CBGs (fasting) 160-245 mg/dL.  Did not bring meter to clinic today  Gout  Last attack >2 years  Vitamin D Deficiency On Vitamin D 8000 IU for several years  Hypertension Reports orthostasis when standing or from his bed.  Reports this is worse at night.    Neuropathy Patient reports neuropathy with throbbing/burning sensation in both sets of toes.  Reports Lyrica helps during the day but traveling or while sleeping the pain gets worse.  Reports lidocaine and capsacian helped.   Patient reports higher dose of Lyrica doesn't seem to help  O:  Physical Exam  Vitals reviewed.  Review of Systems    Cardiovascular: Positive for leg swelling.     Lab Results  Component Value Date   HGBA1C 8.0 (H) 08/18/2016   Vitals:   09/01/16 1242  BP: 126/78  Pulse: 71   Lipid Panel     Component Value Date/Time   CHOL 132 08/18/2016 1345   TRIG 143.0 08/18/2016 1345   HDL 46.70 08/18/2016 1345   CHOLHDL 3 08/18/2016 1345   VLDL 28.6 08/18/2016 1345   LDLCALC 57 08/18/2016 1345    A/P: Diabetes longstanding diagnosed currently above goal of <7. Patient denies hypoglycemic events and is able to verbalize appropriate hypoglycemia management plan. Patient reports adherence with medication. Control is suboptimal due to non-optomized regimen and diet. Following discussion and approval by Dr Lacinda Axon, the following medication changes were made:  -Continue glimepiride 2 mg BID -Started Metformin XR 500 mg daily x 1 week then increase to 500 mg BID.  Discussed side effects including diarrhea and efficacy -Discussed low carbohydrate diet -Instructed patient to check CBGs fasting and 2 hours post prandial  Next A1C anticipated 11/2016.   ASCVD risk greater than 7.5% with LDL at goal. Continued Aspirin 81 mg and Continued simvastatin 40 mg daily.   Diabetic neuropathy currently uncontrolled on pregabalin and duloxetine.  -Started lidocaine patches daily at bedtime.  Instructed patient to not cover them with socks/shoes and to cut them and apply on each foot.  -Will decrease Lyrica to 150 mg twice daily    Hypertension longstanding diagnosed currently controlled with patient experiencing dizziness/orthostasis upon rising potentially related to doxazosin or  benzodiazepine.  Patient reports adherence with medication. -Continue current medications -Consider tapering of BZD or doxazosin if orthostasis/dizziness worsens  Written patient instructions provided.  Total time in face to face counseling 60 minutes.   Follow up in Pharmacist Clinic Visit in 4 weeks.    Patient was seen with Dr Lacinda Axon today in  clinic and medication changes were discussed and approved prior to initiation

## 2016-09-03 ENCOUNTER — Ambulatory Visit: Payer: Medicare Other | Admitting: Family Medicine

## 2016-09-04 ENCOUNTER — Encounter: Payer: Self-pay | Admitting: Family Medicine

## 2016-09-04 ENCOUNTER — Ambulatory Visit (INDEPENDENT_AMBULATORY_CARE_PROVIDER_SITE_OTHER): Payer: Medicare Other | Admitting: Family Medicine

## 2016-09-04 DIAGNOSIS — M170 Bilateral primary osteoarthritis of knee: Secondary | ICD-10-CM | POA: Insufficient documentation

## 2016-09-04 NOTE — Progress Notes (Signed)
[name] [address], [place] [address] Phone: [phone] Subjective:    I'm seeing this patient by the request  of:  Coral Spikes, DO   CC: Knee pain, Bilateral  TOI:[id number]  Douglas [name] is a 71 y.o. male coming in with complaint of knee pain. Patient states that he is been diagnosed with arthritis in both knees. States that he does not want any surgical intervention. Patient still feels that he is having increasing instability. An states that this makes him concern is going to fall. Patient states Has had this pain for quite some time but now is having increasing instability. Feels like the left knee is ready to give out on him. Patient states that this is concerning. Once a continuing stay active. Rates the severity of pain is 4 out of 10 but the severity of instability as 9 out of 10. No new injury. Does better with rest.    Past Medical History:  Diagnosis Date  . Arthritis   . Depression   . Diabetes mellitus without complication ([place])   . Edema    feet/legs  . GERD (gastroesophageal reflux disease)   . Gout   . Hypertension   . Hypothyroidism   . Kidney cysts    renal failure 2013  . Kidney stones    Dr. Rogers Blocker  . Neuropathy    diabetes  . OSA (obstructive sleep apnea)    supplemental oxygen at night  . Oxygen dependent    hs  . S/P cardiac cath 1998   [facility]  . Syncope and collapse   . Temporary low platelet count ([facility])    Past Surgical History:  Procedure Laterality Date  . ANAL FISSURE REPAIR    . BACK SURGERY     rupture disc lumbar spine  . CARDIAC CATHETERIZATION    . CATARACT EXTRACTION W/PHACO Right 07/16/2016   Procedure: CATARACT EXTRACTION PHACO AND INTRAOCULAR LENS PLACEMENT ([facility]);  Surgeon: [name], MD;  Location: [facility] ORS;  Service: Ophthalmology;  Laterality: Right;  [place] 01:11 AP% 17.6 CDE 24.83 fluid pack lot # [id number]  . CATARACT EXTRACTION W/PHACO Left 08/13/2016   Procedure:  CATARACT EXTRACTION PHACO AND INTRAOCULAR LENS PLACEMENT ([place]) suture placed in left eye at end of procedure;  Surgeon: [name], MD;  Location: [facility] ORS;  Service: Ophthalmology;  Laterality: Left;  [place] 01:55 AP% 22.7 CDE 53.56 fluid pack lot # [id number]  . FINGER AMPUTATION     partial  . KNEE SURGERY     arthroscopy  . SHOULDER SURGERY Bilateral    arthroscopic right, rotator cuff repair left   Social History   Social History  . Marital status: Married    Spouse name: [name]  . Number of children: 3  . Years of education: 64   Occupational History  .      retired   Social History Main Topics  . Smoking status: Never Smoker  . Smokeless tobacco: Never Used  . Alcohol use No  . Drug use: No  . Sexual activity: Yes   Other Topics Concern  . None   Social History Narrative   Lives in [place] with his wife ([name]). No children. Three step children. No pets.      Work - Patient is retired. Maintenance supervisor.      School - One year college education.      Right handed.      [place], served in [place], no  combat               Allergies  Allergen Reactions  . Bee Venom Swelling  . Atenolol Other (See Comments)    Did not regulate blood pressure  . Benadryl [Diphenhydramine Hcl (Sleep)] Other (See Comments)    Hyperactivity   . Enalapril Itching    itching  . Gabapentin Palpitations and Rash    rash  . Hctz [Hydrochlorothiazide] Other (See Comments)    Decreased potassium   Family History  Problem Relation Age of Onset  . Breast cancer Mother   . Lung cancer Mother   . Bone cancer Mother   . Heart Problems Mother   . Cancer Mother        breast, lung and rib  . AAA (abdominal aortic aneurysm) Mother   . Heart disease Mother   . Heart attack Father   . Heart disease Father   . Heart attack Brother   . Cancer Brother        esophageal  . Colon cancer Neg Hx     Past medical history, social, surgical and family  history all reviewed in electronic medical record.  No pertanent information unless stated regarding to the chief complaint.   Review of Systems:Review of systems updated and as accurate as of 09/04/16  No headache, visual changes, nausea, vomiting, diarrhea, constipation, dizziness, abdominal pain, skin rash, fevers, chills, night sweats, weight loss, swollen lymph nodes, body aches, joint swelling,  chest pain, shortness of breath, mood changes. Positive muscle aches  Objective  Blood pressure (!) 148/90, height 5\' 11"  (1.803 m), weight 283 lb (128.4 kg), SpO2 95 %. Systems examined below as of 09/04/16   General: No apparent distress alert and oriented x3 mood and affect normal, dressed appropriately. Obese HEENT: Pupils equal, extraocular movements intact  Respiratory: Patient's speak in full sentences and does not appear short of breath  Cardiovascular: No lower extremity edema, non tender, no erythema  Skin: Warm dry intact with no signs of infection or rash on extremities or on axial skeleton.  Abdomen: Soft nontender  Neuro: Cranial nerves II through XII are intact, neurovascularly intact in all extremities with 2+ DTRs and 2+ pulses.  Lymph: No lymphadenopathy of posterior or anterior cervical chain or axillae bilaterally.  Gait normal with good balance and coordination.  MSK:  Non tender with full range of motion and good stability and symmetric strength and tone of shoulders, elbows, wrist, hip, and ankles bilaterally. Mild arthritic changes of multiple joints Knee: Bilateral valgus deformity noted. Large thigh to calf ratio.  Tender to palpation over medial and PF joint line.  ROM full in flexion and extension and lower leg rotation. instability with valgus force. Significant increase in instability on the left knee compared to contralateral knee painful patellar compression. Patellar glide with severe crepitus. Patellar and quadriceps tendons unremarkable. Hamstring and  quadriceps strength is normal.   After informed written and verbal consent, patient was seated on exam table. Right knee was prepped with alcohol swab and utilizing anterolateral approach, patient's right knee space was injected with 4:1  marcaine 0.5%: Kenalog 40mg /dL. Patient tolerated the procedure well without immediate complications.  After informed written and verbal consent, patient was seated on exam table. Left knee was prepped with alcohol swab and utilizing anterolateral approach, patient's left knee space was injected with 4:1  marcaine 0.5%: Kenalog 40mg /dL. Patient tolerated the procedure well without immediate complications.     Impression and Recommendations:  This case required medical decision making of moderate complexity.      Note: This dictation was prepared with Dragon dictation along with smaller phrase technology. Any transcriptional errors that result from this process are unintentional.

## 2016-09-04 NOTE — Assessment & Plan Note (Addendum)
Bilateral injections given today. Good resolution of pain. Discussed with patient at great length about icing regimen, home exercises, which x-rays a doing which ones to avoid. Trial of topical anti-inflammatories given. Due to patient's large thigh to calf ratio and instability knee custom brace is necessary. Patient will be referred for this. Patient will continue these regimen and come back and see me again in 3-4 weeks. Could be a candidate for viscous supplementation.

## 2016-09-04 NOTE — Patient Instructions (Addendum)
Good to see you  You do have arthritis in the knees.  Tried injections today  Tylenol 500mg  3 times a day  Turmeric 500mg  daily  Vitamin D 2000 IU dialy  Tart cherry extract any dose at night pennsaid pinkie amount topically 2 times daily as needed.   With the instability lets get you a brace  See me again in 3-4 weeks.

## 2016-09-09 DIAGNOSIS — N2581 Secondary hyperparathyroidism of renal origin: Secondary | ICD-10-CM | POA: Diagnosis not present

## 2016-09-09 DIAGNOSIS — I10 Essential (primary) hypertension: Secondary | ICD-10-CM | POA: Diagnosis not present

## 2016-09-09 DIAGNOSIS — D631 Anemia in chronic kidney disease: Secondary | ICD-10-CM | POA: Diagnosis not present

## 2016-09-09 DIAGNOSIS — N183 Chronic kidney disease, stage 3 (moderate): Secondary | ICD-10-CM | POA: Diagnosis not present

## 2016-09-23 ENCOUNTER — Encounter: Payer: Self-pay | Admitting: Nurse Practitioner

## 2016-09-23 ENCOUNTER — Encounter (INDEPENDENT_AMBULATORY_CARE_PROVIDER_SITE_OTHER): Payer: Self-pay

## 2016-09-23 ENCOUNTER — Ambulatory Visit (INDEPENDENT_AMBULATORY_CARE_PROVIDER_SITE_OTHER): Payer: Medicare Other | Admitting: Nurse Practitioner

## 2016-09-23 VITALS — BP 152/85 | HR 74 | Ht 71.0 in | Wt 277.4 lb

## 2016-09-23 DIAGNOSIS — G629 Polyneuropathy, unspecified: Secondary | ICD-10-CM

## 2016-09-23 DIAGNOSIS — E1140 Type 2 diabetes mellitus with diabetic neuropathy, unspecified: Secondary | ICD-10-CM | POA: Diagnosis not present

## 2016-09-23 MED ORDER — L-METHYLFOLATE-B6-B12 3-35-2 MG PO TABS: 1.0000 | ORAL_TABLET | Freq: Two times a day (BID) | ORAL | 3 refills | Status: DC

## 2016-09-23 MED ORDER — PREGABALIN 150 MG PO CAPS: 150.0000 mg | ORAL_CAPSULE | Freq: Two times a day (BID) | ORAL | 5 refills | Status: DC

## 2016-09-23 NOTE — Patient Instructions (Signed)
Continue Lyrica at current dose  Continue Cymbalta at current dose Continue Metanx at current dose F/U 6 months, next with Dr. Krista Blue

## 2016-09-23 NOTE — Progress Notes (Signed)
[facility]  PATIENT: [name] Altamirano DOB: 14-Aug-1945   REASON FOR VISIT: follow up for polyneuropathy HISTORY FROM: Patient and wife    HISTORY OF PRESENT ILLNESS:Mr. [place] 71 year old male returns for followup. He has a history of bilateral feet and hand paresthesias. He also has a history of diabetes since 2000, follow-up for diabetic peripheral neuropathy He has gout, obesity, obstructive sleep apnea but could not tolerate CPA, and diabetes for since 2000. He has no distal leg weakness, history of low back pain and occasional right lower extremity shooting pain. EMG nerve conduction showed evidence of length dependent mild axonal peripheral neuropathy. There is no evidence of right lumbosacral radiculopathy. Patient has had side effects to gabapentin in the past which cause swelling and rash. He did not find the cream to be beneficial.Laboratory evaluation in October 2013 showed normal TSH, B12, ESR, C-reactive protein, protein electrophoresis. He was taking Lyrica 189m 3 times a day, but complains of unbearable bilateral feet and fingertips paresthesia, I have added on Trileptal 150 mg twice a day since his previous visit in June 2015, he did not notice any significant benefit, He continued to be active, driving his grandchildren each day, also complains of chronic low back pain, previously had epidural injection, has been very helpful, Most recent A1c was 6.0.  UPDATE September 25 2015: Last visit was with [name] December 2016, he continue complains significant bilateral feet and fingertips paresthesia, He is currently on Cymbalta by his primary care, and Lyrica 100 mg 3 times a day, which has been helpful, he denies significant side effect from combination therapy, in addition he was started on Metanx he noticed definite benefit, most recent A1c 6.4.  He noticed worsening bilateral feet  plantar surface and toe pain after bearing weight, he also noticed  bilateral hands paresthesia, he is hands involvement was before the feet in 2012, We have reviewed laboratory evaluations, there was increased [place] chain in February 2017, he was evaluated by oncologist, was deemed due to his chronic renal disease, UPDATE 12/13/2017CMMr. Cordon, 71year old male returns for follow-up with his polyneuropathy. His symptoms are better control with his Lyrica increased at last visit. He is also on Cymbalta and  Metanx. Most recent hemoglobin A1c 6.3. He denies any recent falls, does not use an assistive device. He returns for reevaluation UPDATE 06/12/2018CM Mr. GBelling 71year old male returns for follow-up for polyneuropathy. He is currently on Lyrica and Cymbalta and Metanx. He has arthritis in both knees and recently had injections. He feels his walking is improved. Recent hemoglobin A1c in May 2018 was 8.0. Reviewed  CBC CMP and lipid profile and  Thyroid. No recent falls no assistive device. He also has a history of gout with no recent flares. He returns for reevaluation  REVIEW OF SYSTEMS: Full 14 system review of systems performed and notable only for those listed, all others are neg:  Constitutional: neg  Cardiovascular: neg Ear/Nose/Throat: neg  Skin: neg Eyes: Light sensitivity recent cataract surgery Respiratory: neg Gastroitestinal: neg  Hematology/Lymphatic: neg  Endocrine: neg Musculoskeletal: Joint pain walking difficulty, neck pain Allergy/Immunology: neg Neurological: Numbness Psychiatric: Anxiety, depression Sleep : neg   ALLERGIES: Allergies  Allergen Reactions  . Bee Venom Swelling  . Atenolol Other (See Comments)    Did not regulate blood pressure  . Benadryl [Diphenhydramine Hcl (Sleep)] Other (See Comments)    Hyperactivity   . Enalapril Itching    itching  . Gabapentin Palpitations and Rash    rash  .  Hctz [Hydrochlorothiazide] Other (See Comments)    Decreased potassium    HOME MEDICATIONS: Outpatient  Medications Prior to Visit  Medication Sig Dispense Refill  . allopurinol (ZYLOPRIM) 300 MG tablet Take 1 tablet (300 mg total) by mouth daily. 90 tablet 3  . ALPRAZolam (XANAX) 1 MG tablet TAKE 1/2 TO 1 TABLET BY MOUTH 3 TIMES A DAY AS NEEDED 90 tablet 3  . aspirin 81 MG tablet Take 81 mg by mouth daily.    . Blood Glucose Monitoring Suppl (FREESTYLE FREEDOM LITE) W/DEVICE KIT Use as directed 1 each 0  . Cholecalciferol (SM VITAMIN D3) 2000 units CAPS Take 8,000 Units by mouth daily.    . colchicine 0.6 MG tablet Take 1 tablet (0.6 mg total) by mouth 2 (two) times daily. 60 tablet 5  . doxazosin (CARDURA) 8 MG tablet Take 1 tablet by mouth at bedtime.  2  . DULoxetine (CYMBALTA) 60 MG capsule Take 1 capsule (60 mg total) by mouth daily. 90 capsule 3  . esomeprazole (NEXIUM) 40 MG capsule Take 1 capsule (40 mg total) by mouth daily at 12 noon. 90 capsule 3  . furosemide (LASIX) 40 MG tablet Take 1 tablet (40 mg total) by mouth daily. 90 tablet 3  . glucose blood (FREESTYLE LITE) test strip Use as directed to check blood sugar twice daily.  Dx: E11.29 200 each 3  . l-methylfolate-B6-B12 (METANX) 3-35-2 MG TABS tablet Take 1 tablet by mouth 2 (two) times daily. 180 tablet 3  . levothyroxine (SYNTHROID, LEVOTHROID) 100 MCG tablet TAKE 1 TABLET BY MOUTH EVERY DAY EXCEPT MONDAY AND WEDNESDAY, TAKE 2 TABLETS ON MONDAY AND WEDNESDAY 90 tablet 3  . loratadine (CLARITIN) 10 MG tablet Take 10 mg by mouth daily.    Marland Kitchen losartan (COZAAR) 50 MG tablet Take 50 mg by mouth daily.    . Magnesium 500 MG TABS Take 1 tablet by mouth daily.     . metFORMIN (GLUCOPHAGE XR) 500 MG 24 hr tablet Take 1 tablet (500 mg total) by mouth 2 (two) times daily. 500 mg daily for 1 week then increase to 500 mg twice daily 60 tablet 1  . pregabalin (LYRICA) 150 MG capsule Take 1 capsule (150 mg total) by mouth 3 (three) times daily. (Patient taking differently: Take 150 mg by mouth 2 (two) times daily. ) 90 capsule 5  . simvastatin  (ZOCOR) 40 MG tablet Take 1 tablet (40 mg total) by mouth at bedtime. 90 tablet 3  . tamsulosin (FLOMAX) 0.4 MG CAPS capsule Take 1 capsule (0.4 mg total) by mouth daily. 90 capsule 1  . Testosterone Propionate 2 % CREA Place 1 mL onto the skin daily.    . vitamin B-12 (CYANOCOBALAMIN) 250 MCG tablet Take 250 mcg by mouth daily.     Marland Kitchen lidocaine (LIDODERM) 5 % Place 1 patch onto the skin daily. Remove & Discard patch within 12 hours or as directed by MD (Patient not taking: Reported on 09/23/2016) 30 patch 1  . glimepiride (AMARYL) 2 MG tablet TAKE 1 TABLET(2 MG) BY MOUTH TWICE DAILY 90 tablet 1   No facility-administered medications prior to visit.     PAST MEDICAL HISTORY: Past Medical History:  Diagnosis Date  . Arthritis   . Depression   . Diabetes mellitus without complication ([place])   . Edema    feet/legs  . GERD (gastroesophageal reflux disease)   . Gout   . Hypertension   . Hypothyroidism   . Kidney cysts    renal  failure 2013  . Kidney stones    Dr. Rogers Blocker  . Neuropathy    diabetes  . OSA (obstructive sleep apnea)    supplemental oxygen at night  . Oxygen dependent    hs  . S/P cardiac cath 1998   [facility]  . Syncope and collapse   . Temporary low platelet count ([place])     PAST SURGICAL HISTORY: Past Surgical History:  Procedure Laterality Date  . ANAL FISSURE REPAIR    . BACK SURGERY     rupture disc lumbar spine  . CARDIAC CATHETERIZATION    . CATARACT EXTRACTION W/PHACO Right 07/16/2016   Procedure: CATARACT EXTRACTION PHACO AND INTRAOCULAR LENS PLACEMENT (IOC);  Surgeon: [name], MD;  Location: [facility] ORS;  Service: Ophthalmology;  Laterality: Right;  [place] 01:11 AP% 17.6 CDE 24.83 fluid pack lot # [id number]  . CATARACT EXTRACTION W/PHACO Left 08/13/2016   Procedure: CATARACT EXTRACTION PHACO AND INTRAOCULAR LENS PLACEMENT ([place]) suture placed in left eye at end of procedure;  Surgeon: [name], MD;  Location: [facility] OR[facility];  Service: Ophthalmology;   Laterality: Left;  [place] 01:55 AP% 22.7 CDE 53.56 fluid pack lot # [id number]  . FINGER AMPUTATION     partial  . KNEE SURGERY     arthroscopy  . SHOULDER SURGERY Bilateral    arthroscopic right, rotator cuff repair left    FAMILY HISTORY: Family History  Problem Relation Age of Onset  . Breast cancer Mother   . Lung cancer Mother   . Bone cancer Mother   . Heart Problems Mother   . Cancer Mother        breast, lung and rib  . AAA (abdominal aortic aneurysm) Mother   . Heart disease Mother   . Heart attack Father   . Heart disease Father   . Heart attack Brother   . Cancer Brother        esophageal  . Colon cancer Neg Hx     SOCIAL HISTORY: Social History   Social History  . Marital status: Married    Spouse name: [name]  . Number of children: 3  . Years of education: 40   Occupational History  .      retired   Social History Main Topics  . Smoking status: Never Smoker  . Smokeless tobacco: Never Used  . Alcohol use No  . Drug use: No  . Sexual activity: Yes   Other Topics Concern  . Not on file   Social History Narrative   Lives in [place] with his wife ([name]). No children. Three step children. No pets.      Work - Patient is retired. Maintenance supervisor.      School - One year college education.      Right handed.      [place], served in [place], no combat                 PHYSICAL EXAM  Vitals:   09/23/16 1424  BP: (!) 152/85  Pulse: 74  Weight: 277 lb 6.4 oz (125.8 kg)  Height: 5' 11" (1.803 m)   Body mass index is 38.69 kg/m. Generalized: Well developed, obese male in no acute distress  Head: normocephalic and atraumatic,. Oropharynx benign  Neck: Supple, no carotid bruits  Cardiac: Regular rate rhythm, no murmur    Neurological examination   Mentation: Alert oriented to time, place, history taking. Follows all commands speech and language fluent  Cranial nerve II-XII: Pupils were equal  round  reactive to light extraocular movements were full, visual field were full on confrontational test. Facial sensation and strength were normal. hearing was intact to finger rubbing bilaterally. Uvula tongue midline. head turning and shoulder shrug and were normal and symmetric.Tongue protrusion into cheek strength was normal. Motor: normal bulk and tone, full strength in the BUE, BLE, fine finger movements normal, no pronator drift. No focal weakness Sensory: Length dependent decreased light touch decreased pinprick to mid shin bilaterally decreased vibratory to ankles proprioception normal  Coordination: finger-nose-finger, heel-to-shin bilaterally, no dysmetria Reflexes: Brachioradialis 2/2, biceps 2/2, triceps 2/2, patellar 2/2, Achilles absent, plantar responses were flexor bilaterally. Gait and Station: Rising up from seated position without assistance, normal stance, moderate stride, good arm swing, smooth turning, tandem without difficulty. No assistive device  DIAGNOSTIC DATA (LABS, IMAGING, TESTING) - I reviewed patient records, labs, notes, testing and imaging myself where available.  Lab Results  Component Value Date   WBC 5.7 08/18/2016   HGB 13.1 08/18/2016   HCT 39.9 08/18/2016   MCV 88.3 08/18/2016   PLT 202.0 08/18/2016      Component Value Date/Time   NA 138 08/18/2016 1345   NA 142 12/04/2012 0414   K 4.3 08/18/2016 1345   K 4.4 12/04/2012 0414   CL 101 08/18/2016 1345   CL 110 (H) 12/04/2012 0414   CO2 32 08/18/2016 1345   CO2 26 12/04/2012 0414   GLUCOSE 224 (H) 08/18/2016 1345   GLUCOSE 116 (H) 12/04/2012 0414   BUN 21 08/18/2016 1345   BUN 32 (H) 12/04/2012 0414   CREATININE 1.47 08/18/2016 1345   CREATININE 2.33 (H) 12/04/2012 0414   CALCIUM 9.8 08/18/2016 1345   CALCIUM 9.1 12/04/2012 0414   PROT 7.0 08/18/2016 1345   PROT 6.8 12/02/2012 2139   ALBUMIN 4.2 08/18/2016 1345   ALBUMIN 3.7 12/02/2012 2139   AST 27 08/18/2016 1345   AST 81 (H) 12/02/2012  2139   ALT 36 08/18/2016 1345   ALT 74 12/02/2012 2139   ALKPHOS 72 08/18/2016 1345   ALKPHOS 64 12/02/2012 2139   BILITOT 0.6 08/18/2016 1345   BILITOT 0.5 12/02/2012 2139   GFRNONAA 51 (L) 06/07/2015 1517   GFRNONAA 28 (L) 12/04/2012 0414   GFRAA 59 (L) 06/07/2015 1517   GFRAA 32 (L) 12/04/2012 0414   Lab Results  Component Value Date   CHOL 132 08/18/2016   HDL 46.70 08/18/2016   LDLCALC 57 08/18/2016   TRIG 143.0 08/18/2016   CHOLHDL 3 08/18/2016   Lab Results  Component Value Date   HGBA1C 8.0 (H) 08/18/2016   Lab Results  Component Value Date   DZHGDJME26 834 05/02/2013   Lab Results  Component Value Date   TSH 3.63 08/18/2016      ASSESSMENT AND PLAN  71 y.o. year old male  has a past medical history Peripheral neuropathy with contributing factors of diabetes. Laboratory evaluation in May  2018 showed hemoglobin A1c of 8. Other labs reviewed   PLAN: Continue Lyrica at current dose will refill  Continue Cymbalta at current dose Continue Metanx at current dose will refill F/U 8 months, next with Dr. Luan Pulling, [facility], [facility], [facility] [address] [place], [place] [address] [phone]

## 2016-09-23 NOTE — Progress Notes (Signed)
Lyrica ([facility]) and Mentax Teacher, English as a foreign language) scripts printed, signed and faxed to pharmacies.

## 2016-09-26 NOTE — Progress Notes (Signed)
I have reviewed and agreed above plan. 

## 2016-09-29 ENCOUNTER — Ambulatory Visit (INDEPENDENT_AMBULATORY_CARE_PROVIDER_SITE_OTHER): Payer: Medicare Other | Admitting: Family Medicine

## 2016-09-29 ENCOUNTER — Encounter: Payer: Self-pay | Admitting: Family Medicine

## 2016-09-29 DIAGNOSIS — M170 Bilateral primary osteoarthritis of knee: Secondary | ICD-10-CM

## 2016-09-29 NOTE — Progress Notes (Signed)
[name] [address], [place] [address] Phone: [phone] Subjective:    I'm seeing this patient by the request  of:  Coral Spikes, DO   CC: Knee pain, Bilateral f/u  [id number]  Douglas [name] is a 71 y.o. male coming in with complaint of knee pain. Patient states that he is been diagnosed with arthritis in both knees. Patient elected do conservative therapy. Patient was to do home exercises, icing regimen. Dressing was given injections. States that he is feeling approximately 50% better right after the injection and now the pain is starting to worsen. Still having some instability. Patient states there is still some swelling as well. Still affecting daily activities    Past Medical History:  Diagnosis Date  . Arthritis   . Depression   . Diabetes mellitus without complication ([place])   . Edema    feet/legs  . GERD (gastroesophageal reflux disease)   . Gout   . Hypertension   . Hypothyroidism   . Kidney cysts    renal failure 2013  . Kidney stones    Dr. Rogers Blocker  . Neuropathy    diabetes  . OSA (obstructive sleep apnea)    supplemental oxygen at night  . Oxygen dependent    hs  . S/P cardiac cath 1998   [facility]  . Syncope and collapse   . Temporary low platelet count ([facility])    Past Surgical History:  Procedure Laterality Date  . ANAL FISSURE REPAIR    . BACK SURGERY     rupture disc lumbar spine  . CARDIAC CATHETERIZATION    . CATARACT EXTRACTION W/PHACO Right 07/16/2016   Procedure: CATARACT EXTRACTION PHACO AND INTRAOCULAR LENS PLACEMENT (IOC);  Surgeon: [name], MD;  Location: [facility] OR[facility];  Service: Ophthalmology;  Laterality: Right;  [place] 01:11 AP% 17.6 CDE 24.83 fluid pack lot # [id number]  . CATARACT EXTRACTION W/PHACO Left 08/13/2016   Procedure: CATARACT EXTRACTION PHACO AND INTRAOCULAR LENS PLACEMENT ([place]) suture placed in left eye at end of procedure;  Surgeon: [name], MD;  Location: [facility];   Service: Ophthalmology;  Laterality: Left;  [place] 01:55 AP% 22.7 CDE 53.56 fluid pack lot # [id number]  . FINGER AMPUTATION     partial  . KNEE SURGERY     arthroscopy  . SHOULDER SURGERY Bilateral    arthroscopic right, rotator cuff repair left   Social History   Social History  . Marital status: Married    Spouse name: [name]  . Number of children: 3  . Years of education: 59   Occupational History  .      retired   Social History Main Topics  . Smoking status: Never Smoker  . Smokeless tobacco: Never Used  . Alcohol use No  . Drug use: No  . Sexual activity: Yes   Other Topics Concern  . None   Social History Narrative   Lives in [place] with his wife ([name]). No children. Three step children. No pets.      Work - Patient is retired. Maintenance supervisor.      School - One year college education.      Right handed.      [place], served in [place], no combat               Allergies  Allergen Reactions  . Bee Venom Swelling  . Atenolol Other (See Comments)    Did not regulate blood pressure  .  Benadryl [Diphenhydramine Hcl (Sleep)] Other (See Comments)    Hyperactivity   . Enalapril Itching    itching  . Gabapentin Palpitations and Rash    rash  . Hctz [Hydrochlorothiazide] Other (See Comments)    Decreased potassium   Family History  Problem Relation Age of Onset  . Breast cancer Mother   . Lung cancer Mother   . Bone cancer Mother   . Heart Problems Mother   . Cancer Mother        breast, lung and rib  . AAA (abdominal aortic aneurysm) Mother   . Heart disease Mother   . Heart attack Father   . Heart disease Father   . Heart attack Brother   . Cancer Brother        esophageal  . Colon cancer Neg Hx     Past medical history, social, surgical and family history all reviewed in electronic medical record.  No pertanent information unless stated regarding to the chief complaint.   Review of Systems: No headache,  visual changes, nausea, vomiting, diarrhea, constipation, dizziness, abdominal pain, skin rash, fevers, chills, night sweats, weight loss, swollen lymph nodes, body aches, joint swelling, chest pain, shortness of breath, mood changes. Positive muscle aches  Objective  Height 5\' 11"  (1.803 m). Systems examined below as of 09/29/16   Systems examined below as of 09/29/16 General: NAD A&O x3 mood, affect normal  HEENT: Pupils equal, extraocular movements intact no nystagmus Respiratory: not short of breath at rest or with speaking Cardiovascular: No lower extremity edema, non tender Skin: Warm dry intact with no signs of infection or rash on extremities or on axial skeleton. Abdomen: Soft nontender, no masses Neuro: Cranial nerves  intact, neurovascularly intact in all extremities with 2+ DTRs and 2+ pulses. Lymph: No lymphadenopathy appreciated today  Gait normal with good balance and coordination.  MSK: Non tender with full range of motion and good stability and symmetric strength and tone of shoulders, elbows, wrist,  hips and ankles bilaterally.  Mild arthritic changes of multiple joints Knee: valgus deformity noted trace effusion. Large thigh to calf ratio.  Tender to palpation over medial and PF joint line.  ROM full in flexion and extension and lower leg rotation. instability with valgus force.  painful patellar compression. Patellar glide with moderate crepitus. Patellar and quadriceps tendons unremarkable. Hamstring and quadriceps strength is normal.   After informed written and verbal consent, patient was seated on exam table. Right knee was prepped with alcohol swab and utilizing anterolateral approach, patient's right knee space was injected with15 mg/2.5 mL of Orthovisc(sodium hyaluronate) in a prefilled syringe was injected easily into the knee through a 22-gauge needle..Patient tolerated the procedure well without immediate complications.  After informed written and verbal  consent, patient was seated on exam table. Left knee was prepped with alcohol swab and utilizing anterolateral approach, patient's left knee space was injected with15 mg/2.5 mL of Orthovisc(sodium hyaluronate) in a prefilled syringe was injected easily into the knee through a 22-gauge needle..Patient tolerated the procedure well without immediate complications.    Impression and Recommendations:     This case required medical decision making of moderate complexity.      Note: This dictation was prepared with Dragon dictation along with smaller phrase technology. Any transcriptional errors that result from this process are unintentional.

## 2016-09-29 NOTE — Patient Instructions (Signed)
Good to see you  Ice is your friend I think the brace will be great  Started orthovisc.  See you again next week!

## 2016-09-29 NOTE — Assessment & Plan Note (Signed)
Worsening symptoms. Failed all conservative therapy. Starting ice regimen and home exercises. Avoid certain activities. Patient come back in 1 week for second in a series of 4 injections.

## 2016-10-06 ENCOUNTER — Encounter: Payer: Self-pay | Admitting: Pharmacist

## 2016-10-06 ENCOUNTER — Ambulatory Visit (INDEPENDENT_AMBULATORY_CARE_PROVIDER_SITE_OTHER): Payer: Medicare Other | Admitting: Pharmacist

## 2016-10-06 DIAGNOSIS — E785 Hyperlipidemia, unspecified: Secondary | ICD-10-CM

## 2016-10-06 DIAGNOSIS — I10 Essential (primary) hypertension: Secondary | ICD-10-CM | POA: Diagnosis not present

## 2016-10-06 DIAGNOSIS — E118 Type 2 diabetes mellitus with unspecified complications: Secondary | ICD-10-CM | POA: Diagnosis not present

## 2016-10-06 MED ORDER — GLIMEPIRIDE 2 MG PO TABS: 2.0000 mg | ORAL_TABLET | Freq: Two times a day (BID) | ORAL | 1 refills | Status: DC

## 2016-10-06 NOTE — Patient Instructions (Addendum)
Continue metformin 500 mg twice daily  Restart glimepiride 2 mg twice daily  Continue checking blood glucose at least once daily  I will call you in 1 week   Followup with Pharmacist in 6 weeks

## 2016-10-06 NOTE — Assessment & Plan Note (Signed)
Hypertension longstanding diagnosed currently controlled with patient experiencing dizziness/orthostasis upon rising potentially related to doxazosin or benzodiazepine.  Patient reports adherence with medication. -Continue current medications -Consider tapering of BZD or doxazosin if orthostasis/dizziness worsens

## 2016-10-06 NOTE — Progress Notes (Signed)
S:    Chief Complaint  Patient presents with  . Medication Management    Diabetes    Patient arrives in good spirits accompanied by his wife.  Presents for diabetes evaluation, education, and management at the request of Dr Lacinda Axon. Patient was referred on 08/19/16 (lab note).  Patient was last seen by Primary Care Provider on 08/18/16.   Today patient reports CBGs have increased since last visit - were previously around 160 but now around 190-200 mg/dL.  Shortly after last visit he was started on Kenalog knee injections and has  4 weeks of steroid injections in knees remaining.  He reports the injections have made him more mobile.    Since restarting metformin he reports ncreased number of bowel movements with loose stool.  He states this is not as bad as previously with metformin and is this is currently tolerable.    Patient also states he is currently in the donut hole.     Patient reports adherence with medications.  Current diabetes medications include: metformin XR 500 mg BID Current hypertension medications include: doxazosin, furosemide, losartan  Patient denies hypoglycemic events.  Patient reported dietary habits: Trying to drink more water and decreasing portions.    Patient reported exercise habits: Has been able to increase walking with decreased neuropathy.  Does have donjoy knee braces.  Is increasing his walking at home (but limited by heat)   Patient reports nocturia once per night.  Denies pain/burning upon urination.  Reports he has a full bladder and full emptying of the bladder.  Patient reports visual changes following cataract surgeries.   Patient reports self foot exams. Denies changes.   Vitamin D Deficiency Patient reports being on vitamin D 8000 IU for at least 5 years.  Neuropathy Patient reports neuropathy but has improved with steroid injections in feet and legs but not in his hands.  He reports he tried the lidocaine patches on his feet but these did  not help   Hypertension  Self Monitored BP values <140/90.  Patient does report some orthostasis upon standing but denies falls.    O:  Physical Exam  Constitutional: He appears well-developed and well-nourished.  Vitals reviewed.  Review of Systems  Constitutional: Negative.    Lab Results  Component Value Date   HGBA1C 8.0 (H) 08/18/2016   Lipid Panel     Component Value Date/Time   CHOL 132 08/18/2016 1345   TRIG 143.0 08/18/2016 1345   HDL 46.70 08/18/2016 1345   CHOLHDL 3 08/18/2016 1345   VLDL 28.6 08/18/2016 1345   LDLCALC 57 08/18/2016 1345   Vitals:   10/06/16 0812  BP: 117/79  Pulse: 74    Home fasting CBG (see attached CBG log): 195, 196, 188, 186, 171, 194, 186, 168, 162, 167, 174, 168, 188 mg/dL  A/P: Diabetes longstanding diagnosed currently uncontrolled with increased CBGs likely due to steroid injections. Patient denies hypoglycemic events and is able to verbalize appropriate hypoglycemia management plan. Patient reports adherence with medication. Control is suboptimal due to diet, patient currently in medicare coverage gap, and steroid induced hyperglycemia. Following discussion and approval by Dr Lacinda Axon, the following medication changes were made:  -Continue metformin XR 500 mg BID.  Will not increase further at this time due to patient reported occasional loose stool.   -Restart glimepiride 2 mg twice daily due to steroid induced hyperglycemia -Consider addition of long acting insulin at next visit  -Discussed low carbohydrate diet and increasing exercise -Discussed s/sx/tx of  hypoglycemia  -Next A1C anticipated 11/2016.    ASCVD risk greater than 7.5%. Continued Aspirin 81 mg and Continued simvastatin 40 mg daily.   Hypertension longstanding diagnosed currently controlled with patient experiencing dizziness/orthostasis upon rising potentially related to doxazosin or benzodiazepine.  Patient reports adherence with medication. -Continue current  medications -Consider tapering of BZD or doxazosin if orthostasis/dizziness worsens  Vitamin D  Patient taking Vitamin D 8000 IU for >5 years with last vitamin D 25 OH of 83 in 2015.  -Consider future vitamin D level and dose reduction at future visit.    Written patient instructions provided.  Total time in face to face counseling 60 minutes.   Follow up in Pharmacist Clinic Visit in 6 weeks (1 week by telephone).   Patient seen with Deirdre Pippins, PharmD  Patient was seen with Dr Lacinda Axon today in clinic and medication changes were discussed and approved prior to initiation

## 2016-10-06 NOTE — Assessment & Plan Note (Signed)
Diabetes longstanding diagnosed currently uncontrolled with increased CBGs likely due to steroid injections. Patient denies hypoglycemic events and is able to verbalize appropriate hypoglycemia management plan. Patient reports adherence with medication. Control is suboptimal due to diet, patient currently in medicare coverage gap, and steroid induced hyperglycemia. Following discussion and approval by Dr Lacinda Axon, the following medication changes were made:  -Continue metformin XR 500 mg BID.  Will not increase further at this time due to patient reported occasional loose stool.   -Restart glimepiride 2 mg twice daily due to steroid induced hyperglycemia -Consider addition of long acting insulin at next visit  -Discussed low carbohydrate diet and increasing exercise -Discussed s/sx/tx of hypoglycemia  -Next A1C anticipated 11/2016.

## 2016-10-06 NOTE — Progress Notes (Signed)
Care was provided under my supervision. I agree with the management as indicated in the note.  Cristan Scherzer DO  

## 2016-10-06 NOTE — Assessment & Plan Note (Signed)
ASCVD risk greater than 7.5%. Continued Aspirin 81 mg and Continued simvastatin 40 mg daily.

## 2016-10-07 ENCOUNTER — Ambulatory Visit (INDEPENDENT_AMBULATORY_CARE_PROVIDER_SITE_OTHER): Payer: Medicare Other | Admitting: Family Medicine

## 2016-10-07 ENCOUNTER — Encounter: Payer: Self-pay | Admitting: Family Medicine

## 2016-10-07 ENCOUNTER — Telehealth: Payer: Self-pay | Admitting: Family Medicine

## 2016-10-07 DIAGNOSIS — M170 Bilateral primary osteoarthritis of knee: Secondary | ICD-10-CM

## 2016-10-07 NOTE — Patient Instructions (Signed)
2 down and 2 to go! You will do great  Ice is your friend.  See me again in 1 week! Happy 4th

## 2016-10-07 NOTE — Progress Notes (Signed)
[name] [address], [place] [address] Phone: [phone] Subjective:    I'm seeing this patient by the request  of:  Coral Spikes, DO   CC: Knee pain, Bilateral f/u  [id number]  Douglas [name] is a 71 y.o. male coming in with complaint of knee pain. Patient states that he is been diagnosed with arthritis in both knees. Patient elected do conservative therapy. Patient was to do home exercises, icing regimen. Patient if you for second in a series of 4 injections for viscous supplementation after failing all conservative therapy. Patient states no significant change of the arm.    Past Medical History:  Diagnosis Date  . Arthritis   . Depression   . Diabetes mellitus without complication ([place])   . Edema    feet/legs  . GERD (gastroesophageal reflux disease)   . Gout   . Hypertension   . Hypothyroidism   . Kidney cysts    renal failure 2013  . Kidney stones    Dr. Rogers Blocker  . Neuropathy    diabetes  . OSA (obstructive sleep apnea)    supplemental oxygen at night  . Oxygen dependent    hs  . S/P cardiac cath 1998   [facility]  . Syncope and collapse   . Temporary low platelet count (HCC)    Past Surgical History:  Procedure Laterality Date  . ANAL FISSURE REPAIR    . BACK SURGERY     rupture disc lumbar spine  . CARDIAC CATHETERIZATION    . CATARACT EXTRACTION W/PHACO Right 07/16/2016   Procedure: CATARACT EXTRACTION PHACO AND INTRAOCULAR LENS PLACEMENT (IOC);  Surgeon: [name], MD;  Location: [facility] ORS;  Service: Ophthalmology;  Laterality: Right;  [place] 01:11 AP% 17.6 CDE 24.83 fluid pack lot # [id number]  . CATARACT EXTRACTION W/PHACO Left 08/13/2016   Procedure: CATARACT EXTRACTION PHACO AND INTRAOCULAR LENS PLACEMENT ([place]) suture placed in left eye at end of procedure;  Surgeon: [name], MD;  Location: [facility] ORS;  Service: Ophthalmology;  Laterality: Left;  [place] 01:55 AP% 22.7 CDE 53.56 fluid pack lot #  [id number]  . FINGER AMPUTATION     partial  . KNEE SURGERY     arthroscopy  . SHOULDER SURGERY Bilateral    arthroscopic right, rotator cuff repair left   Social History   Social History  . Marital status: Married    Spouse name: [name]  . Number of children: 3  . Years of education: 88   Occupational History  .      retired   Social History Main Topics  . Smoking status: Never Smoker  . Smokeless tobacco: Never Used  . Alcohol use No  . Drug use: No  . Sexual activity: Yes   Other Topics Concern  . None   Social History Narrative   Lives in [place] with his wife ([name]). No children. Three step children. No pets.      Work - Patient is retired. Maintenance supervisor.      School - One year college education.      Right handed.      [place], served in [place], no combat               Allergies  Allergen Reactions  . Bee Venom Swelling  . Atenolol Other (See Comments)    Did not regulate blood pressure  . Benadryl [Diphenhydramine Hcl (Sleep)] Other (See Comments)    Hyperactivity   .  Enalapril Itching    itching  . Gabapentin Palpitations and Rash    rash  . Hctz [Hydrochlorothiazide] Other (See Comments)    Decreased potassium   Family History  Problem Relation Age of Onset  . Breast cancer Mother   . Lung cancer Mother   . Bone cancer Mother   . Heart Problems Mother   . Cancer Mother        breast, lung and rib  . AAA (abdominal aortic aneurysm) Mother   . Heart disease Mother   . Heart attack Father   . Heart disease Father   . Heart attack Brother   . Cancer Brother        esophageal  . Colon cancer Neg Hx     Past medical history, social, surgical and family history all reviewed in electronic medical record.  No pertanent information unless stated regarding to the chief complaint.   R.zsys   Objective  Blood pressure 132/74, pulse 77, height 5\' 11"  (1.803 m), weight 283 lb (128.4 kg), SpO2 97 %.     Systems examined below as of 10/07/16  Knee: Bilateral valgus deformity noted. Large thigh to calf ratio.  Tender to palpation over medial and PF joint line.  ROM full in flexion and extension and lower leg rotation. instability with valgus force.  painful patellar compression. Patellar glide with moderate crepitus. Patellar and quadriceps tendons unremarkable. Hamstring and quadriceps strength is normal.   After informed written and verbal consent, patient was seated on exam table. Right knee was prepped with alcohol swab and utilizing anterolateral approach, patient's right knee space was injected with15 mg/2.5 mL of Orthovisc(sodium hyaluronate) in a prefilled syringe was injected easily into the knee through a 22-gauge needle..Patient tolerated the procedure well without immediate complications.  After informed written and verbal consent, patient was seated on exam table. Left knee was prepped with alcohol swab and utilizing anterolateral approach, patient's left knee space was injected with15 mg/2.5 mL of Orthovisc(sodium hyaluronate) in a prefilled syringe was injected easily into the knee through a 22-gauge needle..Patient tolerated the procedure well without immediate complications.    Impression and Recommendations:     This case required medical decision making of moderate complexity.      Note: This dictation was prepared with Dragon dictation along with smaller phrase technology. Any transcriptional errors that result from this process are unintentional.

## 2016-10-07 NOTE — Assessment & Plan Note (Signed)
Orthovisc injections given bilaterally again today. 2 out of 4 been done. We discussed icing regimen. Follow-up in one week for third in a series of 4 injections.

## 2016-10-07 NOTE — Telephone Encounter (Signed)
Noted, thanks!

## 2016-10-07 NOTE — Telephone Encounter (Signed)
Pt dropped off paper work for [employer]. I am going to keep up front until next Monday when he comes into office.

## 2016-10-13 ENCOUNTER — Other Ambulatory Visit: Payer: Self-pay | Admitting: Family Medicine

## 2016-10-13 MED ORDER — PREGABALIN 100 MG PO CAPS: 100.0000 mg | ORAL_CAPSULE | Freq: Three times a day (TID) | ORAL | 1 refills | Status: DC

## 2016-10-16 ENCOUNTER — Telehealth: Payer: Self-pay | Admitting: Pharmacist

## 2016-10-16 ENCOUNTER — Encounter: Payer: Self-pay | Admitting: Pharmacist

## 2016-10-16 NOTE — Telephone Encounter (Signed)
10/16/16  Called patient to followup CBGs after restarting glimepiride for steroid induced hyperglycemia.  Was unable to reach him via telephone.  Have left HIPAA compliant voicemail asking him to return my call.   Medication Assistance: Medicare patient currently in the donut hole and having difficulty affording Lyrica.  He is above income limits for low income subsidy/extra help and has spent >$1000 out of pocket for his medications in 2018.     Plan: -Will await patients return telephone call to followup CBGs - Patient has followup with pharmacist in 1 month -[facility] patient assistance application has been submitted for Lyrica  Bennye Alm, PharmD, [place] PGY2 Pharmacy Resident [phone]

## 2016-10-17 ENCOUNTER — Ambulatory Visit (INDEPENDENT_AMBULATORY_CARE_PROVIDER_SITE_OTHER): Payer: Medicare Other | Admitting: Family Medicine

## 2016-10-17 ENCOUNTER — Encounter: Payer: Self-pay | Admitting: Family Medicine

## 2016-10-17 DIAGNOSIS — M170 Bilateral primary osteoarthritis of knee: Secondary | ICD-10-CM

## 2016-10-17 NOTE — Patient Instructions (Signed)
Good to see you  You are doing great  1 more [place]! See you next week.

## 2016-10-17 NOTE — Assessment & Plan Note (Signed)
3 and a series of 4 injections given today. Tolerated the procedure well. We discussed icing regimen and continuing conservative therapy. Patient will follow-up in one week for fourth and final injections.

## 2016-10-17 NOTE — Progress Notes (Signed)
[name] [address], [place] [address] Phone: [phone] Subjective:    I'm seeing this patient by the request  of:  Coral Spikes, DO   CC: Knee pain, Bilateral f/u  [id number]  Douglas [name] is a 71 y.o. male coming in with complaint of knee pain. Patient states that he is been diagnosed with arthritis in both knees. Patient elected do conservative therapy. Patient was to do home exercises, icing regimen. Patient if you for 3rd in a series of 4 injections for viscous supplementation after failing all conservative therapy. Improvement. Doing well, traveling next week.  No new symptoms.    Past Medical History:  Diagnosis Date  . Arthritis   . Depression   . Diabetes mellitus without complication ([place])   . Edema    feet/legs  . GERD (gastroesophageal reflux disease)   . Gout   . Hypertension   . Hypothyroidism   . Kidney cysts    renal failure 2013  . Kidney stones    Dr. Rogers Blocker  . Neuropathy    diabetes  . OSA (obstructive sleep apnea)    supplemental oxygen at night  . Oxygen dependent    hs  . S/P cardiac cath 1998   [facility]  . Syncope and collapse   . Temporary low platelet count (HCC)    Past Surgical History:  Procedure Laterality Date  . ANAL FISSURE REPAIR    . BACK SURGERY     rupture disc lumbar spine  . CARDIAC CATHETERIZATION    . CATARACT EXTRACTION W/PHACO Right 07/16/2016   Procedure: CATARACT EXTRACTION PHACO AND INTRAOCULAR LENS PLACEMENT (IOC);  Surgeon: [name], MD;  Location: [facility];  Service: Ophthalmology;  Laterality: Right;  [place] 01:11 AP% 17.6 CDE 24.83 fluid pack lot # [id number]  . CATARACT EXTRACTION W/PHACO Left 08/13/2016   Procedure: CATARACT EXTRACTION PHACO AND INTRAOCULAR LENS PLACEMENT ([place]) suture placed in left eye at end of procedure;  Surgeon: [name], MD;  Location: [facility];  Service: Ophthalmology;  Laterality: Left;  [place] 01:55 AP% 22.7 CDE 53.56 fluid  pack lot # [id number]  . FINGER AMPUTATION     partial  . KNEE SURGERY     arthroscopy  . SHOULDER SURGERY Bilateral    arthroscopic right, rotator cuff repair left   Social History   Social History  . Marital status: Married    Spouse name: [name]  . Number of children: 3  . Years of education: 2   Occupational History  .      retired   Social History Main Topics  . Smoking status: Never Smoker  . Smokeless tobacco: Never Used  . Alcohol use No  . Drug use: No  . Sexual activity: Yes   Other Topics Concern  . None   Social History Narrative   Lives in [place] with his wife ([name]). No children. Three step children. No pets.      Work - Patient is retired. Maintenance supervisor.      School - One year college education.      Right handed.      [place], served in [place], no combat               Allergies  Allergen Reactions  . Bee Venom Swelling  . Atenolol Other (See Comments)    Did not regulate blood pressure  . Benadryl [Diphenhydramine Hcl (Sleep)] Other (See Comments)    Hyperactivity   .  Enalapril Itching    itching  . Gabapentin Palpitations and Rash    rash  . Hctz [Hydrochlorothiazide] Other (See Comments)    Decreased potassium   Family History  Problem Relation Age of Onset  . Breast cancer Mother   . Lung cancer Mother   . Bone cancer Mother   . Heart Problems Mother   . Cancer Mother        breast, lung and rib  . AAA (abdominal aortic aneurysm) Mother   . Heart disease Mother   . Heart attack Father   . Heart disease Father   . Heart attack Brother   . Cancer Brother        esophageal  . Colon cancer Neg Hx     Past medical history, social, surgical and family history all reviewed in electronic medical record.  No pertanent information unless stated regarding to the chief complaint.   R.zsys   Objective  Blood pressure (!) 142/72, pulse 88, height 5\' 11"  (1.803 m), SpO2 97 %.   Systems  examined below as of 10/17/16  Knee:Bilateral valgus deformity noted. Large thigh to calf ratio.  Tender to palpation over medial and PF joint line.  ROM full in flexion and extension and lower leg rotation. instability with valgus force.  painful patellar compression. Patellar glide with moderate crepitus. Patellar and quadriceps tendons unremarkable. Hamstring and quadriceps strength is normal.   After informed written and verbal consent, patient was seated on exam table. Right knee was prepped with alcohol swab and utilizing anterolateral approach, patient's right knee space was injected with15 mg/2.5 mL of Orthovisc(sodium hyaluronate) in a prefilled syringe was injected easily into the knee through a 22-gauge needle..Patient tolerated the procedure well without immediate complications. After informed written and verbal consent, patient was seated on exam table. Left knee was prepped with alcohol swab and utilizing anterolateral approach, patient's left knee space was injected with15 mg/2.5 mL of Orthovisc(sodium hyaluronate) in a prefilled syringe was injected easily into the knee through a 22-gauge needle..Patient tolerated the procedure well without immediate complications.   Impression and Recommendations:     This case required medical decision making of moderate complexity.      Note: This dictation was prepared with Dragon dictation along with smaller phrase technology. Any transcriptional errors that result from this process are unintentional.

## 2016-10-28 ENCOUNTER — Encounter: Payer: Self-pay | Admitting: Family Medicine

## 2016-10-28 ENCOUNTER — Ambulatory Visit (INDEPENDENT_AMBULATORY_CARE_PROVIDER_SITE_OTHER): Payer: Medicare Other | Admitting: Family Medicine

## 2016-10-28 DIAGNOSIS — M170 Bilateral primary osteoarthritis of knee: Secondary | ICD-10-CM | POA: Diagnosis not present

## 2016-10-28 NOTE — Telephone Encounter (Addendum)
10/28/16  Called patient to followup CBGs today. Was unable to reach him via telephone and left HIPAA compliant voicemail asking him to return my call.     [employer] patient assistance for Lyrica.  Patients application has not been uploaded into there system yet.  They state this takes 2-3 weeks.    Plan:  Will await patients return telephone call to followup CBGs Will followup Lyrica medication assistance when patient returns call  Bennye Alm, PharmD, [place] PGY2 Pharmacy Resident [phone]

## 2016-10-28 NOTE — Progress Notes (Signed)
[name] [address], [place] [address] Phone: [phone] Subjective:    I'm seeing this patient by the request  of:  Coral Spikes, DO   CC: Knee pain, Bilateral f/u  [id number]  Douglas [name] is a 71 y.o. male coming in with complaint of knee pain. Patient states that he is been diagnosed with arthritis in both knees. Patient elected do conservative therapy. Patient was to do home exercises, icing regimen. Patient if you for 4th and final injection Doing great,very happy with the results.   No new symptoms.    Past Medical History:  Diagnosis Date  . Arthritis   . Depression   . Diabetes mellitus without complication ([place])   . Edema    feet/legs  . GERD (gastroesophageal reflux disease)   . Gout   . Hypertension   . Hypothyroidism   . Kidney cysts    renal failure 2013  . Kidney stones    Dr. Rogers Blocker  . Neuropathy    diabetes  . OSA (obstructive sleep apnea)    supplemental oxygen at night  . Oxygen dependent    hs  . S/P cardiac cath 1998   [facility]  . Syncope and collapse   . Temporary low platelet count (HCC)    Past Surgical History:  Procedure Laterality Date  . ANAL FISSURE REPAIR    . BACK SURGERY     rupture disc lumbar spine  . CARDIAC CATHETERIZATION    . CATARACT EXTRACTION W/PHACO Right 07/16/2016   Procedure: CATARACT EXTRACTION PHACO AND INTRAOCULAR LENS PLACEMENT ([facility]);  Surgeon: [name], MD;  Location: [facility];  Service: Ophthalmology;  Laterality: Right;  [place] 01:11 AP% 17.6 CDE 24.83 fluid pack lot # [id number]  . CATARACT EXTRACTION W/PHACO Left 08/13/2016   Procedure: CATARACT EXTRACTION PHACO AND INTRAOCULAR LENS PLACEMENT ([place]) suture placed in left eye at end of procedure;  Surgeon: [name], MD;  Location: [facility];  Service: Ophthalmology;  Laterality: Left;  [place] 01:55 AP% 22.7 CDE 53.56 fluid pack lot # [id number]  . FINGER AMPUTATION     partial  . KNEE SURGERY     arthroscopy  . SHOULDER SURGERY Bilateral    arthroscopic right, rotator cuff repair left   Social History   Social History  . Marital status: Married    Spouse name: [name]  . Number of children: 3  . Years of education: 13   Occupational History  .      retired   Social History Main Topics  . Smoking status: Never Smoker  . Smokeless tobacco: Never Used  . Alcohol use No  . Drug use: No  . Sexual activity: Yes   Other Topics Concern  . None   Social History Narrative   Lives in [place] with his wife ([name]). No children. Three step children. No pets.      Work - Patient is retired. Maintenance supervisor.      School - One year college education.      Right handed.      [place], served in [place], no combat               Allergies  Allergen Reactions  . Bee Venom Swelling  . Atenolol Other (See Comments)    Did not regulate blood pressure  . Benadryl [Diphenhydramine Hcl (Sleep)] Other (See Comments)    Hyperactivity   . Enalapril Itching    itching  . Gabapentin  Palpitations and Rash    rash  . Hctz [Hydrochlorothiazide] Other (See Comments)    Decreased potassium   Family History  Problem Relation Age of Onset  . Breast cancer Mother   . Lung cancer Mother   . Bone cancer Mother   . Heart Problems Mother   . Cancer Mother        breast, lung and rib  . AAA (abdominal aortic aneurysm) Mother   . Heart disease Mother   . Heart attack Father   . Heart disease Father   . Heart attack Brother   . Cancer Brother        esophageal  . Colon cancer Neg Hx     Past medical history, social, surgical and family history all reviewed in electronic medical record.  No pertanent information unless stated regarding to the chief complaint.   Review of Systems: No headache, visual changes, nausea, vomiting, diarrhea, constipation, dizziness, abdominal pain, skin rash, fevers, chills, night sweats, weight loss, swollen lymph nodes,   chest pain, shortness of breath, mood changes.  ositive muscle aches and joint swelling   Objective  Blood pressure 120/82, pulse 80, height 5\' 11"  (1.803 m), weight 282 lb (127.9 kg), SpO2 99 %.   Systems examined below as of 10/28/16  Knee:bilateral  valgus deformity noted. Large thigh to calf ratio.  Non tender today  ROM full in flexion and extension and lower leg rotation. instability with valgus force.  painful patellar compression. Patellar glide with moderate crepitus. Patellar and quadriceps tendons unremarkable. Hamstring and quadriceps strength is normal.  After informed written and verbal consent, patient was seated on exam table. Right knee was prepped with alcohol swab and utilizing anterolateral approach, patient's right knee space was injected with15 mg/2.5 mL of Orthovisc(sodium hyaluronate) in a prefilled syringe was injected easily into the knee through a 22-gauge needle..Patient tolerated the procedure well without immediate complications.  After informed written and verbal consent, patient was seated on exam table. Left knee was prepped with alcohol swab and utilizing anterolateral approach, patient's left knee space was injected with15 mg/2.5 mL of Orthovisc(sodium hyaluronate) in a prefilled syringe was injected easily into the knee through a 22-gauge needle..Patient tolerated the procedure well without immediate complications.      Impression and Recommendations:     This case required medical decision making of moderate complexity.      Note: This dictation was prepared with Dragon dictation along with smaller phrase technology. Any transcriptional errors that result from this process are unintentional.

## 2016-10-28 NOTE — Progress Notes (Signed)
Pre visit review using our clinic review tool, if applicable. No additional management support is needed unless otherwise documented below in the visit note. 

## 2016-10-28 NOTE — Assessment & Plan Note (Signed)
Fourth and inal injection given today. Tolerated procedure well.Discussed icing regimen, continue conservative therapy. Follow-up in 4 weeks.

## 2016-10-28 NOTE — Patient Instructions (Signed)
Th is it! Ice is your friend Continue everything else you are doing Can repeat this again in 6 months if needed Maybe see me again in 6 weeks if you need me otherwise see me as you need me.

## 2016-10-28 NOTE — Telephone Encounter (Signed)
Patient returned phone call.  He states after restarting glimepiride his fasting CBGs have been <160 mg/dL.  Ranging 130-150 mg/dL   Patient states he passed out Saturday with a fall while mowing the yard.  Patient reports his blood pressure was 74/58.  His CBG was 200 mg/dL. Patient does believe he was dehydrated somewhat at that time.    Will followup CBGs/ medication assistance in a couple of weeks (pharmacist visit on 11/17/16) Instructed patient to drink more water to prevent hypotensive episodes.  Bennye Alm, PharmD, [place] PGY2 Pharmacy Resident [phone]

## 2016-10-30 ENCOUNTER — Encounter: Payer: Self-pay | Admitting: Nephrology

## 2016-11-01 ENCOUNTER — Other Ambulatory Visit: Payer: Self-pay | Admitting: Family Medicine

## 2016-11-01 DIAGNOSIS — E118 Type 2 diabetes mellitus with unspecified complications: Secondary | ICD-10-CM

## 2016-11-03 ENCOUNTER — Other Ambulatory Visit: Payer: Self-pay | Admitting: Radiology

## 2016-11-03 MED ORDER — LEVOTHYROXINE SODIUM 100 MCG PO TABS: ORAL_TABLET | ORAL | 3 refills | Status: DC

## 2016-11-03 MED ORDER — SIMVASTATIN 40 MG PO TABS: 40.0000 mg | ORAL_TABLET | Freq: Every day | ORAL | 3 refills | Status: DC

## 2016-11-03 NOTE — Telephone Encounter (Signed)
Prev pt of Dr. Gilford Rile. Rx request for Simvastatin. PT last OV was with Merleen Nicely on 10/06/16. PT last labs were on 08/18/16. Ok to refill?

## 2016-11-03 NOTE — Telephone Encounter (Signed)
Prev pt of Dr. Gilford Rile, Rx request for levothyroxine. Last OV was with Merleen Nicely on 10/06/16. Last labs were 08/18/16. Ok to refill?

## 2016-11-17 ENCOUNTER — Ambulatory Visit (INDEPENDENT_AMBULATORY_CARE_PROVIDER_SITE_OTHER): Payer: Medicare Other | Admitting: Pharmacist

## 2016-11-17 ENCOUNTER — Encounter: Payer: Self-pay | Admitting: Pharmacist

## 2016-11-17 VITALS — BP 106/66 | HR 71 | Wt 287.2 lb

## 2016-11-17 DIAGNOSIS — M1A3790 Chronic gout due to renal impairment, unspecified ankle and foot, without tophus (tophi): Secondary | ICD-10-CM | POA: Diagnosis not present

## 2016-11-17 DIAGNOSIS — E1142 Type 2 diabetes mellitus with diabetic polyneuropathy: Secondary | ICD-10-CM

## 2016-11-17 LAB — COMPREHENSIVE METABOLIC PANEL
ALT: 25 U/L (ref 9–46)
AST: 20 U/L (ref 10–35)
Albumin: 3.9 g/dL (ref 3.6–5.1)
Alkaline Phosphatase: 76 U/L (ref 40–115)
BUN: 15 mg/dL (ref 7–25)
CO2: 30 mmol/L (ref 20–32)
Calcium: 9.1 mg/dL (ref 8.6–10.3)
Chloride: 100 mmol/L (ref 98–110)
Creat: 1.3 mg/dL — ABNORMAL HIGH (ref 0.70–1.18)
Glucose, Bld: 137 mg/dL — ABNORMAL HIGH (ref 65–99)
Potassium: 4.1 mmol/L (ref 3.5–5.3)
Sodium: 140 mmol/L (ref 135–146)
Total Bilirubin: 0.6 mg/dL (ref 0.2–1.2)
Total Protein: 6.3 g/dL (ref 6.1–8.1)

## 2016-11-17 MED ORDER — BASAGLAR KWIKPEN 100 UNIT/ML ~~LOC~~ SOPN: 10.0000 [IU] | PEN_INJECTOR | Freq: Every day | SUBCUTANEOUS | 0 refills | Status: DC

## 2016-11-17 NOTE — Progress Notes (Signed)
Care was provided under my supervision. I have approved the mentioned changes. Labs today. I agree with the management as indicated in the note.  Thersa Salt DO

## 2016-11-17 NOTE — Progress Notes (Signed)
S:     Chief Complaint  Patient presents with  . Medication Management    diabetes    Patient arrives in good spirits, ambulating without assistance, presenting with wife.  Presents for diabetes evaluation, education, and management at the request of Dr. Lacinda Axon. Patient was referred on 08/19/16 (lab note).  Patient was last seen by Primary Care Provider on 08/18/16.   Patient reports that his CBGs have remained elevated but are better than before after having steroid injections in knee on July 17th.  Notably, he reports recent syncope x1 and frequent lightheadedness. Patient states that he was started on tamsulosin by his nephrologist in the last few weeks. Patient continues on doxazosin at nighttime.   Family/Social History: currently in the donut hole  Patient reports adherence with medications.  Current diabetes medications include: Metformin XR 500 mg BID, glimepiride 2 mg BID Current hypertension medications include: doxazosin, losartan, furosemide  Patient denies hypoglycemic events.  Patient reported dietary habits: Eats 2 meals/day Breakfast: Doesn't eat Lunch: Cheerios multi-grain with skim milk Dinner: baked, mostly pain fried meats, vegetables, rice or boiled potatoes  Snacks: peanuts, slushies every other day, dark chocolate midnight milky way 4x/week    Drinks: water >100 oz water/day  Patient reported exercise habits: mowing lawn, no other exercise   Patient denies nocturia.  Patient denies neuropathy. Patient denies visual changes. Patient reports self foot exams.   O:  Physical Exam  Constitutional: He appears well-developed.    Review of Systems  All other systems reviewed and are negative.  CrCl ~46 ml/min (normalized)   Lab Results  Component Value Date   HGBA1C 8.0 (H) 08/18/2016   Vitals:   11/17/16 0911  BP: 106/66  Pulse: 71    Home fasting CBG: 150s - 160s    A/P: Diabetes longstanding diagnosed currently uncontrolled with increased  CBGs likely due to steroid injections. Patient denies hypoglycemic events and is able to verbalize appropriate hypoglycemia management plan. Patient reports adherence with medication. Control is suboptimal due to diet, patient currently in medicare coverage gap, and steroid induced hyperglycemia. Following discussion and approval by Dr Lacinda Axon, the following medication changes were made:  -Started Basaglar 10 units daily, increase by 1 unit daily until fasting CBG <120. Sample provided. Teaching provided. -Continue metformin XR 500 mg BID.  Will not increase further at this time due to patient reported occasional loose stool.   -Continue glimepiride 2 mg twice daily due to steroid induced hyperglycemia -Discussed low carbohydrate diet and increasing exercise -Discussed s/sx/tx of hypoglycemia  -A1C today  ASCVD risk greater than 7.5%. Continued Aspirin 81 mg and Continued simvastatin 40 mg daily.   Hypertension longstanding diagnosed currently controlled with patient experiencing dizziness/orthostasis upon rising potentially related to doxazosin (especially given use of concomitant tamsulosin) or benzodiazepine. Patient reportsadherence with medication. -d/c Doxazosin -Continue all other meds at this time -BMET today  Medication Samples have been provided to the patient. Drug name: Basaglar (insulin glargine) 100 Unit/mL     Qty: 1 pen  LOT: [id number]  Exp.Date: 11/2017 Dosing instructions: 10 units daily, increase by 1 unit daily until fasting CBG <120 The patient has been instructed regarding the correct time, dose, and frequency of taking this medication, including desired effects and most common side effects.   Patient was seen with Dr. Lacinda Axon today in clinic and medication changes were discussed and approved prior to initiation  Written patient instructions provided.  Total time in face to face counseling 45 minutes.  Follow up in Pharmacist Clinic Visit 2 weeks.   Patient seen with  Shelle Iron, PharmD [place], Pharm.D. PGY2 Ambulatory Care Pharmacy Resident Phone: [phone]

## 2016-11-17 NOTE — Patient Instructions (Addendum)
Thanks for coming to see [place] today!   START Basaglar (insulin glargine) 10 units once a day. Increase by 1 unit every day until your fasting sugars are less than 120.   STOP taking doxazosin 8 mg daily.   Keep writing down your blood pressures and blood sugars.   Labs today.  Continue taking all of your other meds as prescribed.   Call me if you have have issues! [phone].   Come back to see me in ~ 2 weeks.

## 2016-11-18 ENCOUNTER — Ambulatory Visit (INDEPENDENT_AMBULATORY_CARE_PROVIDER_SITE_OTHER): Payer: Medicare Other | Admitting: Family Medicine

## 2016-11-18 ENCOUNTER — Encounter: Payer: Self-pay | Admitting: Family Medicine

## 2016-11-18 VITALS — BP 140/86 | HR 80 | Temp 98.4°F | Wt 287.2 lb

## 2016-11-18 DIAGNOSIS — E1142 Type 2 diabetes mellitus with diabetic polyneuropathy: Secondary | ICD-10-CM

## 2016-11-18 DIAGNOSIS — E785 Hyperlipidemia, unspecified: Secondary | ICD-10-CM

## 2016-11-18 DIAGNOSIS — I10 Essential (primary) hypertension: Secondary | ICD-10-CM

## 2016-11-18 LAB — HEMOGLOBIN A1C
Hgb A1c MFr Bld: 7.4 % — ABNORMAL HIGH (ref <5.7)
Mean Plasma Glucose: 166 mg/dL

## 2016-11-18 LAB — URIC ACID: Uric Acid, Serum: 4.9 mg/dL (ref 4.0–8.0)

## 2016-11-18 NOTE — Patient Instructions (Signed)
Continue your meds.  Follow up with Chrys Racer.  Take care  Dr. Lacinda Axon

## 2016-11-18 NOTE — Progress Notes (Signed)
Subjective:  Patient ID: Douglas [name], male    DOB: 1946/02/28  Age: 71 y.o. MRN: [id number]  CC: Follow up  HPI:  71 year old male with DM 2 with severe peripheral neuropathy, hypertension, hyperlipidemia, hypothyroidism, OSA, obesity, chronic kidney disease presents for follow-up.  DM-2  Improving.  Just saw my pharmacist yesterday and was placed on basal insulin. Remains on metformin and Amaryl.   A1c 7.4 as of yesterday.  Hypertension  Stable on losartan and Lasix.  Neuropathy  Severe. Patient stable on Lyrica and Cymbalta.  Hyperlipidemia  LDL at goal on simvastatin.  Social Hx   Social History   Social History  . Marital status: Married    Spouse name: [name]  . Number of children: 3  . Years of education: 74   Occupational History  .      retired   Social History Main Topics  . Smoking status: Never Smoker  . Smokeless tobacco: Never Used  . Alcohol use No  . Drug use: No  . Sexual activity: Yes   Other Topics Concern  . None   Social History Narrative   Lives in [place] with his wife ([name]). No children. Three step children. No pets.      Work - Patient is retired. Maintenance supervisor.      School - One year college education.      Right handed.      [employer], served in [place], no combat                Review of Systems  Constitutional: Negative.   Neurological:       Paresthesia/numbness.   Objective:  BP 140/86 (BP Location: Left Arm, Patient Position: Sitting, Cuff Size: Large)   Pulse 80   Temp 98.4 F (36.9 C) (Oral)   Wt 287 lb 4 oz (130.3 kg)   SpO2 95%   BMI 40.06 kg/m   BP/Weight 11/18/2016 11/17/2016 04/22/3233  Systolic BP 573 220 254  Diastolic BP 86 66 82  Wt. (Lbs) 287.25 287.2 282  BMI 40.06 40.06 39.33    Physical Exam  Constitutional: He is oriented to person, place, and time. He appears well-developed. No distress.  HENT:  Head: Normocephalic and atraumatic.  Eyes:  Conjunctivae are normal. No scleral icterus.  Cardiovascular: Normal rate and regular rhythm.   Pulmonary/Chest: Effort normal and breath sounds normal. He has no wheezes. He has no rales.  Abdominal: Soft. He exhibits no distension. There is no tenderness. There is no rebound and no guarding.  Neurological: He is alert and oriented to person, place, and time.  Skin: Skin is warm. No rash noted.  Psychiatric: He has a normal mood and affect.  Vitals reviewed.   Lab Results  Component Value Date   WBC 5.7 08/18/2016   HGB 13.1 08/18/2016   HCT 39.9 08/18/2016   PLT 202.0 08/18/2016   GLUCOSE 137 (H) 11/17/2016   CHOL 132 08/18/2016   TRIG 143.0 08/18/2016   HDL 46.70 08/18/2016   LDLCALC 57 08/18/2016   ALT 25 11/17/2016   AST 20 11/17/2016   NA 140 11/17/2016   K 4.1 11/17/2016   CL 100 11/17/2016   CREATININE 1.30 (H) 11/17/2016   BUN 15 11/17/2016   CO2 30 11/17/2016   TSH 3.63 08/18/2016   PSA 0.81 01/27/2015   HGBA1C 7.4 (H) 11/17/2016   MICROALBUR <0.7 08/30/2015    Assessment & Plan:   Problem List Items Addressed This Visit  Diabetic neuropathy (HCC)    Severe but stable at this time. Continue Lyrica and Cymbalta.      DM type 2 with diabetic peripheral neuropathy (HCC) - Primary    A1c improving. Continue titrating insulin until fasting blood sugars are well controlled. Continue metformin and Amaryl.      Essential hypertension    Stable on losartan and Lasix. Continue.      Hyperlipidemia    At goal on simvastatin. Continue.        Follow-up: As scheduled.  [place]

## 2016-11-18 NOTE — Assessment & Plan Note (Signed)
At goal on simvastatin. Continue.

## 2016-11-18 NOTE — Assessment & Plan Note (Signed)
Severe but stable at this time. Continue Lyrica and Cymbalta.

## 2016-11-18 NOTE — Assessment & Plan Note (Signed)
Stable on losartan and Lasix. Continue.

## 2016-11-18 NOTE — Assessment & Plan Note (Signed)
A1c improving. Continue titrating insulin until fasting blood sugars are well controlled. Continue metformin and Amaryl.

## 2016-12-01 ENCOUNTER — Ambulatory Visit (INDEPENDENT_AMBULATORY_CARE_PROVIDER_SITE_OTHER): Payer: Medicare Other | Admitting: Pharmacist

## 2016-12-01 ENCOUNTER — Encounter: Payer: Self-pay | Admitting: Pharmacist

## 2016-12-01 VITALS — BP 138/80 | HR 72 | Wt 284.2 lb

## 2016-12-01 DIAGNOSIS — E1142 Type 2 diabetes mellitus with diabetic polyneuropathy: Secondary | ICD-10-CM

## 2016-12-01 MED ORDER — BASAGLAR KWIKPEN 100 UNIT/ML ~~LOC~~ SOPN: 15.0000 [IU] | PEN_INJECTOR | Freq: Every day | SUBCUTANEOUS | 6 refills | Status: DC

## 2016-12-01 MED ORDER — BASAGLAR KWIKPEN 100 UNIT/ML ~~LOC~~ SOPN: 10.0000 [IU] | PEN_INJECTOR | Freq: Every day | SUBCUTANEOUS | 3 refills | Status: DC

## 2016-12-01 NOTE — Progress Notes (Signed)
Care was provided under my supervision. I agree with the management as indicated in the note.  Floretta Petro DO  

## 2016-12-01 NOTE — Assessment & Plan Note (Signed)
Diabetes longstanding currently uncontrolled with last A1C 7.4 (09/16/4648), complicated by steroid-induced hyperglycemia. Patient denies hypoglycemic events and is able to verbalize appropriate hypoglycemia management plan. Patient reports adherence with medication. Control is suboptimal due to dietary indiscretion and sedentary lifestyle, also misunderstanding of instructions from last visit.  Following discussion and approval by Dr. Lacinda Axon, the following medication changes were made:  -Continue all medications -Counseled patient to increase Basalgar by 1 unit until fasting BG are between 80 to 130 mg/dL. Patient verbalized understanding. -Application started for [facility] Patient Assistance Program for [place] -Next A1C anticipated 02/2017   ASCVD risk greater than 7.5%. Continued Aspirin 325 mg and Continued simvastatin 40 mg. -Can consider increasing to higher intensity statin in future visits given ASCVD risk score; however LDL well controlled at 57 on 08/18/2016 labs.

## 2016-12-01 NOTE — Addendum Note (Signed)
Addended by: Deirdre Pippins E on: 12/01/2016 01:52 PM   Modules accepted: Orders

## 2016-12-01 NOTE — Patient Instructions (Addendum)
Thanks for coming to see [place] today. Here are the medication changes we talked about:  1. Please stop taking your Doxazosin, and continue monitoring your blood pressure.   2. Please go up on your Basalgar by 1 unit until your fasting blood sugar are between 80 to 130 mg/dL. If your sugars goes down less than 100 consistently, then go down on your insulin by 1 unit.  Please come back to see [place] in 4 weeks.  Call me if you have any questions.  Chrys Racer: [phone]

## 2016-12-01 NOTE — Progress Notes (Signed)
S:     Chief Complaint  Patient presents with  . Medication Management    Diabetes    Patient arrives in good spirits, ambulating without assistance, presenting with wife  Presents for diabetes evaluation, education, and management as a follow up from last Pharmacy visit on 11/17/2016. Patient was referred on 08/19/16 (lab note) by Dr. Lacinda Axon. Patient was last seen by Primary Care Provider on 11/18/2016.  Patient has not stopped doxazosin since last visit due to misunderstanding, and has experienced several hypotensive episodes with dizziness and feeling like "passing out." No actual syncope or recent falls. Reports 1 episode of dysuria with possible kidney stones, no issues since. Last reported steroid injection on July 17th. Has worked hard to improve his diet by cutting back on slurpees and candy bars.  Currently taking Basalgar 10 units daily, and has not self-titrated due to misunderstanding. Received Basalgar sample from our office at last visit, and is currently in the Medicare donut hole. Currently receives Lyrica from [employer] Patient Assistance Program.  Patient reports adherence with medications.  Current diabetes medications include: metformin XR 500mg  BID, glimepiride 2mg  BID, Basalgar 10 units daily  Current hypertension medications include: furosemide 40mg  daily, losartan 50mg  daily, tamsulosin 0.4mg  daily, doxazosin (has not stopped since last visit due to misunderstanding)  Patient denies hypoglycemic events.  Patient reported dietary habits: Eats 2-3 meals/day. Limited to 1 slurpee and 1 large candy bar per week Breakfast: cereal and milk when he gets up (usually ~11 am to 2 pm) Lunch: mostly snacks (nuts), tomato sandwiches, hamburger steak Dinner: largest meal, homecooked, usually salad, meats and veggies Snacks: crackers and cheese, hard salami Drinks: water, protein shakes  Patient reported exercise habits: walking, mowing   Patient reports nocturia. 1x a night. Patient  reports neuropathy. Takes Cymbalta and Lyrica Patient denies visual changes.  Patient reports self foot exams. No issues.    O:  Physical Exam  Constitutional: He appears well-developed and well-nourished.   Review of Systems  All other systems reviewed and are negative.  Lab Results  Component Value Date   HGBA1C 7.4 (H) 11/17/2016   Vitals:   12/01/16 0907  BP: 138/80  Pulse: 72   Home fasting CBG: 130-150s, with excursions to 113 and 167  Bedtime CBG: 200s, with excursions to 118 and 241 Home BP monitoring: lowest 94/69, highest 184/88  10 year ASCVD risk: 37.3%  A/P: Diabetes longstanding currently uncontrolled with last A1C 7.4 (11/16/1658), complicated by steroid-induced hyperglycemia. Patient denies hypoglycemic events and is able to verbalize appropriate hypoglycemia management plan. Patient reports adherence with medication. Control is suboptimal due to dietary indiscretion and sedentary lifestyle, also misunderstanding of instructions from last visit.  Following discussion and approval by Dr. Lacinda Axon, the following medication changes were made:  -Continue all medications -Counseled patient to increase Basalgar by 1 unit until fasting BG are between 80 to 130 mg/dL. Patient verbalized understanding. -Application started for [facility] Patient Assistance Program for [place] -Next A1C anticipated 02/2017   ASCVD risk greater than 7.5%. Continued Aspirin 325 mg and Continued simvastatin 40 mg. -Can consider increasing to higher intensity statin in future visits given ASCVD risk score; however LDL well controlled at 57 on 08/18/2016 labs.   Hypertension longstanding currently uncontrolled with multiple hypotensive episodes.  Patient reports adherence with medication. Control is suboptimal due to medication misunderstanding. -Continue all medications -Counseled patient to stop doxazosin, and continue monitoring home BP. Patient verbalized understanding. -Continue to monitor BP at  next clinic visit.  Patient  was seen with Dr. Lacinda Axon today in clinic and medication changes were discussed and approved prior to initiation Written patient instructions provided. Total time in face to face counseling 40 minutes.   Follow up in Pharmacist Clinic Visit in 4 weeks.  Patient seen with Shelle Iron, PharmD [place], Pharm.D. PGY2 Ambulatory Care Pharmacy Resident Phone: [phone]

## 2016-12-01 NOTE — Assessment & Plan Note (Signed)
Hypertension longstanding currently uncontrolled with multiple hypotensive episodes.  Patient reports adherence with medication. Control is suboptimal due to medication misunderstanding. -Continue all medications -Counseled patient to stop doxazosin, and continue monitoring home BP. Patient verbalized understanding. -Continue to monitor BP at next clinic visit.

## 2016-12-01 NOTE — Addendum Note (Signed)
Addended by: Johna Sheriff on: 12/01/2016 01:46 PM   Modules accepted: Orders

## 2016-12-05 NOTE — Telephone Encounter (Signed)
error 

## 2016-12-09 ENCOUNTER — Telehealth: Payer: Self-pay | Admitting: Pharmacist

## 2016-12-09 NOTE — Telephone Encounter (Signed)
Patient denied through [place] for [employer] 2/2 income restrictions. Patient notified. Will discuss other options with Dr. Lacinda Axon.   Carlean Jews, Pharm.D. PGY2 Ambulatory Care Pharmacy Resident Phone: [phone]

## 2016-12-11 ENCOUNTER — Telehealth: Payer: Self-pay | Admitting: Family Medicine

## 2016-12-11 NOTE — Telephone Encounter (Signed)
Ria from [employer] called with a denial on busalgar for patient. There are 2 alternatives on his formulary of Lantus, and TJL. They will be sending a denial letter in the mail.

## 2016-12-11 NOTE — Telephone Encounter (Signed)
PA started on Coverr my meds for Basaglar insulin. Douglas [name] [place]: [id number]

## 2016-12-16 ENCOUNTER — Other Ambulatory Visit: Payer: Self-pay

## 2016-12-16 ENCOUNTER — Other Ambulatory Visit: Payer: Self-pay | Admitting: Family Medicine

## 2016-12-16 DIAGNOSIS — Z760 Encounter for issue of repeat prescription: Secondary | ICD-10-CM

## 2016-12-16 MED ORDER — INSULIN DEGLUDEC 100 UNIT/ML ~~LOC~~ SOPN: 15.0000 [IU] | PEN_INJECTOR | Freq: Every day | SUBCUTANEOUS | 3 refills | Status: DC

## 2016-12-16 MED ORDER — GLUCOSE BLOOD VI STRP: ORAL_STRIP | 3 refills | Status: DC

## 2016-12-26 ENCOUNTER — Telehealth: Payer: Self-pay | Admitting: Pharmacist

## 2016-12-26 NOTE — Telephone Encounter (Signed)
Patient approved through [employer] for [place] through 03/13/2017. Shipment of needle pens and insulin pens will be sent to [facility].   Called patient to relay this information. Per patient, has been out of insulin since 9/5. Has made major dietary changes since insulin was initiated. No longer eating cookies and candy bars, one slushie/week. Patient reports FBG of 100-120s, excursions to 157, 149 since 9/5.  Denies hypoglycemia.  States that he has had two bouts of diarrhea, resolved. Stopped doxazosin as instructed and since has not had any dizzy spells/near syncope. Able to void on tamsulosin alone. Reports BP 110s-130s/80s-90s at home.   A/P #DM - improved control on metformin alone at this time in setting of dietary changes -Asked patient to test 2hr PPBG for the weekend -Will call next week to re-assess need for insulin  #Hypotension - resolved without compromising control of BPH sx since d/c doxazosin -Will reassess BP at next visit  Patient has f/u appt with me on 9/24. Instructed patient to call with any hypoglycemia or BG > 200. Pt verbalized understanding.   Carlean Jews, Pharm.D. PGY2 Ambulatory Care Pharmacy Resident Phone: [phone]

## 2016-12-27 NOTE — Telephone Encounter (Signed)
Care was provided under my supervision. I agree with the management as indicated in the note.  Lavere Shinsky DO  

## 2016-12-29 ENCOUNTER — Telehealth: Payer: Self-pay | Admitting: Pharmacist

## 2016-12-29 NOTE — Telephone Encounter (Signed)
Patient returned phone call. States fastings have been in 90s consistently, before bedtime was 120s. Denies documented hypoglycemia but states that he felt nauseated at church on Sunday, drank ginger ale and felt better.   States that he has had one more episode of diarrhea over the weekend but feels well overall.   A/P #DM - concerned for hypoglycemia, likely attributable to glimepiride.  -Recommend that he hold evening dose of glimepiride for now, will discuss with Dr. Lacinda Axon and follow up with patient if more changes need to be made prior to next appointment .   Patient has follow up appointment with me 9/24.   Carlean Jews, Pharm.D. PGY2 Ambulatory Care Pharmacy Resident Phone: [phone]

## 2016-12-29 NOTE — Telephone Encounter (Signed)
Called patient to assess CBG control since last conversation. LVM requesting patient call me back.   Carlean Jews, Pharm.D. PGY2 Ambulatory Care Pharmacy Resident Phone: [phone]

## 2016-12-30 NOTE — Telephone Encounter (Signed)
Care was provided under my supervision. I agree with the management as indicated in the note.  Cheryln Balcom DO  

## 2017-01-05 ENCOUNTER — Encounter: Payer: Self-pay | Admitting: Pharmacist

## 2017-01-05 ENCOUNTER — Ambulatory Visit (INDEPENDENT_AMBULATORY_CARE_PROVIDER_SITE_OTHER): Payer: Medicare Other | Admitting: Pharmacist

## 2017-01-05 VITALS — BP 136/85 | HR 71 | Wt 282.6 lb

## 2017-01-05 DIAGNOSIS — E1142 Type 2 diabetes mellitus with diabetic polyneuropathy: Secondary | ICD-10-CM | POA: Diagnosis not present

## 2017-01-05 DIAGNOSIS — M542 Cervicalgia: Secondary | ICD-10-CM

## 2017-01-05 MED ORDER — INSULIN DEGLUDEC 100 UNIT/ML ~~LOC~~ SOPN: 5.0000 [IU] | PEN_INJECTOR | Freq: Every day | SUBCUTANEOUS | 0 refills | Status: DC

## 2017-01-05 NOTE — Progress Notes (Signed)
.    S:     Chief Complaint  Patient presents with  . Medication Management    Diabetes  . Medication Assistance    Tyler Aas     Patient arrives in good spirits, accompanied by wife, ambulating without assistance.  Presents for diabetes evaluation, education, and management as a follow up from last Pharmacy visit on 12/01/2016. Patient was referred on 08/19/16 (lab note) by Dr. Lacinda Axon. Patient was last seen by Primary Care Provider on 11/18/2016. Since last visit, patient called into clinic reporting subjective low BG. Glimepiride was decreased to 2 mg once daily.   Since last conversation, denies GI upset, falls, dizziness. Able to void. Patient brings CBG and BP log with him. States he has only been taking metformin XR 500 mg BID and glimepiride 2 mg daily. Ran out of insulin samples 9/5. Has been approved for [place] patient assistance from manufacturer however shipment has been delayed 2/2 inclement weather. Denies further s/sx hypoglycemia. Has continued dietary changes of only one slurpee weekly and one candy bar daily.   Complains of neck pain, no associated chest pain.   Patient reports adherence with medications.  Current diabetes medications include: metformin XR 500 mg BID, glimepiride 2 mg once daily Current hypertension medications include: furosemide 40mg  daily, losartan 50mg  daily, tamsulosin 0.4mg  daily  Patient reported dietary habits: Eats 2-3 meals/day Breakfast: cereal and milk when he gets up (usually ~11 am to 2 pm) Lunch: mostly snacks (nuts), tomato sandwiches, hamburger steak Dinner: Pint of stew and one cup of rice. Had some cheese as well.  Snacks: Candy bar once a week, ritz crackers before bed Drinks:Has slurpee once a week. Water and protein shakes typically.   Patient reported exercise habits: Does yard work, walks a little   Patient denies nocturia. Decreased, 1x a night  Patient reports neuropathy. Takes Cymbalta and Lyrica  Patient denies visual changes.  Vision has gotten better Patient reports self foot exams. No issues   O:  Physical Exam  Constitutional: He appears well-developed and well-nourished.  Vitals reviewed.  Review of Systems  All other systems reviewed and are negative.  Lab Results  Component Value Date   HGBA1C 7.4 (H) 11/17/2016   Vitals:   01/05/17 1014  BP: 136/85  Pulse: 71    Home fasting CBG: Since 12/17/16, 100s to 120s with excursions to 140s - 150s, lowest recorded= 85  2 hour post-prandial/random CBG: 140s to 180s with excursions to 190s   10 year ASCVD risk: 37.3%.  A/P: #Diabetes longstanding, currently controlled per CBG log. Patient denies hypoglycemic events and is able to verbalize appropriate hypoglycemia management plan. Patient reports adherence with medication. Following discussion and approval by Dr. [name], the following medication changes were made:  - D/c glimepiride - want to avoid further beta-cell exhaustion  - Start [name] 5 units daily - conservative approach given excellent glycemic control. Titrate by 1 unit q3 days until FBG between 80-130. Sample pen provided.  -Manufacturer supply to arrive soon. Patient to call me when he is getting low if he has not heard from doctor's office by then. -Continue metformin XR 500 mg daily - do not push dose given h/o GI upset with attempts previously.  Next A1C anticipated 02/17/2017 or later.    #ASCVD risk greater than 7.5%. Continued Aspirin 325 mg and Continued simvastatin 40 mg.  -Can consider increasing to higher intensity statin in future visits given ASCVD risk score; however LDL well controlled at 57 mg/dL on 08/18/2016 labs.   #  Hypertension longstanding, currently controlled per home BP readings in 110s-120s/70s-80s.  Patient reports adherence with medication. Denies dizziness/syncope. No compromise in BPH control.  -Continue current meds  #Neck pain - patient complains of neck pain, evaluated by Dr. Biagio Quint.  -See note by Dr.  [name].   Patient was seen with Dr. [name] today in clinic and medication changes were discussed and approved prior to initiation  Written patient instructions provided.  Total time in face to face counseling 45 minutes.   Follow up in Pharmacist Clinic Visit 6-8 weeks.   Patient seen with Barkley Boards, PharmD Candidate.  Carlean Jews, Pharm.D. PGY2 Ambulatory Care Pharmacy Resident Phone: [phone]

## 2017-01-05 NOTE — Patient Instructions (Addendum)
It was good seeing you today.  Your blood sugar control is good.  Continue taking your metformin XR 500 mg twice daily  Stop taking glimepiride  Start taking [name] 5 units once a day. Increase by 1 unit every 3 days until fasting blood sugars are between 80-130. If they are in the 70s when you are waking up, decrease by 1 unit.   When you are close to running out, call me! We will see if your shipment has arrived yet.   [phone] - Call me for any questions or issues! We will see you again in 6-8 weeks `

## 2017-01-05 NOTE — Progress Notes (Signed)
  [name], MD Phone: [phone]  Douglas [name] is a 71 y.o. male who presents today for follow-up.  Patient seen by pharmacist with myself. I agree with her documentation.  Patient complained of bilateral trapezius area neck pain. Has been going on for some time now. It will spasm on him so it is difficult for him to turn his neck. No radiation down his arms. No numbness or weakness. No associated chest pain. He wonders if it is the vasculature in his neck. He is asymptomatic today.  Patient also occasionally reports a funny sensation in his chest. It is not a chest pressure. It is not palpitations. There is no shortness of breath. It does not radiate. He has had it intermittently for a number of years. He is asymptomatic today.  ROS see history of present illness  Objective  Physical Exam Vitals:   01/05/17 1014  BP: 136/85  Pulse: 71    BP Readings from Last 3 Encounters:  01/05/17 136/85  12/01/16 138/80  11/18/16 140/86   Wt Readings from Last 3 Encounters:  01/05/17 282 lb 9.6 oz (128.2 kg)  12/01/16 284 lb 3.2 oz (128.9 kg)  11/18/16 287 lb 4 oz (130.3 kg)    Physical Exam  Constitutional: No distress.  Cardiovascular: Normal rate, regular rhythm and normal heart sounds.   No carotid bruits  Musculoskeletal:  no tenderness of the musculature of his neck  Neurological: He is alert.  5/5 strength bilateral biceps, triceps, and grip, sensation light touch intact in bilateral upper extremities  Skin: Skin is warm and dry. He is not diaphoretic.     Assessment/Plan: Please see individual problem list.  Neck pain: Suspect musculoskeletal cause. Likely muscular strain or spasm. Benign exam today. Neurologically intact in his upper extremities. Advised that his carotid arteries sound normal on exam. Discussed continuing to monitor. Consider further evaluation with possible x-ray or lab work if continues to be an issue.  Patient has noted a sensation in  his chest. It is not described as chest pain. Discussed doing an EKG today to evaluate further though he deferred this to his follow-up visit. It has been fairly chronic. He'll continue to monitor. Given return precautions.   [name], MD [place]

## 2017-01-05 NOTE — Assessment & Plan Note (Signed)
#  Diabetes longstanding, currently controlled per CBG log. Patient denies hypoglycemic events and is able to verbalize appropriate hypoglycemia management plan. Patient reports adherence with medication. Following discussion and approval by Dr. [name], the following medication changes were made:  - D/c glimepiride - want to avoid further beta-cell exhaustion  - Start [name] 5 units daily - conservative approach given excellent glycemic control. Titrate by 1 unit q3 days until FBG between 80-130. Sample pen provided.  -Manufacturer supply to arrive soon. Patient to call me when he is getting low if he has not heard from doctor's office by then. -Continue metformin XR 500 mg daily - do not push dose given h/o GI upset with attempts previously.  Next A1C anticipated 02/17/2017 or later.    #ASCVD risk greater than 7.5%. Continued Aspirin 325 mg and Continued simvastatin 40 mg.  -Can consider increasing to higher intensity statin in future visits given ASCVD risk score; however LDL well controlled at 57 mg/dL on 08/18/2016 labs.

## 2017-01-05 NOTE — Assessment & Plan Note (Signed)
#  Hypertension longstanding, currently controlled per home BP readings in 110s-120s/70s-80s.  Patient reports adherence with medication. Denies dizziness/syncope. No compromise in BPH control.  -Continue current meds

## 2017-01-05 NOTE — Addendum Note (Signed)
Addended by: [name] on: 01/05/2017 05:29 PM   Modules accepted: Level of Service

## 2017-01-16 ENCOUNTER — Other Ambulatory Visit: Payer: Self-pay | Admitting: Family Medicine

## 2017-01-26 DIAGNOSIS — R600 Localized edema: Secondary | ICD-10-CM | POA: Diagnosis not present

## 2017-01-26 DIAGNOSIS — I10 Essential (primary) hypertension: Secondary | ICD-10-CM | POA: Diagnosis not present

## 2017-01-26 DIAGNOSIS — N2581 Secondary hyperparathyroidism of renal origin: Secondary | ICD-10-CM | POA: Diagnosis not present

## 2017-01-26 DIAGNOSIS — N183 Chronic kidney disease, stage 3 (moderate): Secondary | ICD-10-CM | POA: Diagnosis not present

## 2017-02-03 ENCOUNTER — Other Ambulatory Visit: Payer: Self-pay | Admitting: Family Medicine

## 2017-02-03 DIAGNOSIS — E118 Type 2 diabetes mellitus with unspecified complications: Secondary | ICD-10-CM

## 2017-02-16 ENCOUNTER — Encounter: Payer: Self-pay | Admitting: Pharmacist

## 2017-02-16 ENCOUNTER — Ambulatory Visit (INDEPENDENT_AMBULATORY_CARE_PROVIDER_SITE_OTHER): Payer: Medicare Other | Admitting: Pharmacist

## 2017-02-16 ENCOUNTER — Ambulatory Visit: Payer: Medicare Other | Admitting: Pharmacist

## 2017-02-16 VITALS — BP 148/98 | Ht 71.0 in | Wt 277.0 lb

## 2017-02-16 DIAGNOSIS — E1142 Type 2 diabetes mellitus with diabetic polyneuropathy: Secondary | ICD-10-CM | POA: Diagnosis not present

## 2017-02-16 DIAGNOSIS — Z23 Encounter for immunization: Secondary | ICD-10-CM

## 2017-02-16 NOTE — Assessment & Plan Note (Signed)
#  Hypertension longstanding, currently controlled per home BP readings, uncontrolled in office.  Patient reports adherence with medication, however restarted doxazosin and requests to stay on it. Denies dizziness/syncope.  -D/c tamsulosin -Continue other medications

## 2017-02-16 NOTE — Patient Instructions (Addendum)
1. STOP taking your tamsulosin  2. Continue increasing Tyler Aas every 3-4 days until your fasting sugars are in the low 100s.   3. Follow up with Dr. [name] about decreased your alprazolam and possibly discontinuing your allopurinol. He will draw your A1C as well.   Come back to see me in ~ 1 month.

## 2017-02-16 NOTE — Progress Notes (Signed)
    S:     Chief Complaint  Patient presents with  . Medication Management    Diabetes    Patient arrives in good spirits, ambulating without assistance.  Presents for diabetes evaluation, education, and management at the request of Dr. Lacinda Axon. Patient was referred on 08/19/2016.  Patient was last seen by Primary Care Provider on 11/18/2016. Has upcoming appointment scheduled on 02/18/2017 with Dr. [name].   Since last Rx clinic visit, patient reports worsening control of CBGs - has been titrating [place] appropriately, up to 18 units daily. Endorses worsening stress as several family members live with him and they have been fighting a lot. Several grandchildren struggle with mental illness.  Also reports he has restarted doxazosin as he experienced urinary incontinence for several days. This is resolved since started back doxazosin. Of note, still taking tamsulosin. Denies dizziness, syncope, lightheadedness.   Home log reveals BP in 90s-130s/60s-80s and FBG mid-to-high 100s  Patient reports adherence with medications.  Current diabetes medications include: [name] 18 units daily - titrating  Current hypertension medications include:   Patient denies hypoglycemic events.  Patient reported dietary habits: Eats 2-3 meals/day Breakfast: cheerios + milk  Lunch: sandwich - either tomato or pimento cheese Dinner: Meat + 2 vegetables Snacks: nuts Drinks: water  Patient reported exercise habits: walked on track x1 since last visit   Patient denies nocturia. Decreased, 1x a night  Patient reports neuropathy. Takes Cymbalta and Lyrica  Patient denies visual changes. Vision has gotten better Patient reports self foot exams. No issues   O:  Physical Exam  Constitutional: He appears well-developed and well-nourished.  Vitals reviewed.    Review of Systems  All other systems reviewed and are negative.    Lab Results  Component Value Date   HGBA1C 7.4 (H) 11/17/2016   Vitals:   02/16/17 0922  BP: (!) 148/98    Home fasting CBG: 160s-180s, excursions to low-to-mid 200s, 140s  A/P: Diabetes longstanding currently uncontrolled per CBGs and A1C. Patient denies hypoglycemic events and is able to verbalize appropriate hypoglycemia management plan. Patient reports adherence with medication. Control is suboptimal due to stress, dietary indiscretion, and sedentary lifestyle. Following discussion and approval by Dr. [name], the following medication changes were made:  -Continued basal insulin [name] (insulin degludec). Patient will continue to titrate 1 unit/ 3-4 days if fasting CBGs > 100mg /dl until fasting CBGs reach goal or next visit.  Has supply from manufacturer -Continued metformin XR 500 mg BID. -Next A1C anticipated 02/17/2017 or later.     #ASCVD risk greater than 7.5%. Continued Aspirin 325 mg and Continued simvastatin 40 mg.  -Can consider increasing to higher intensity statin in future visits given ASCVD risk score; however LDL well controlled at 57 mg/dL on 08/18/2016 labs.   #Hypertension longstanding, currently controlled per home BP readings, uncontrolled in office.  Patient reports adherence with medication, however restarted doxazosin and requests to stay on it. Denies dizziness/syncope.  -D/c tamsulosin -Continue other medications   Patient was seen with Dr. [name] today in clinic and medication changes were discussed and approved prior to initiation  Written patient instructions provided.  Total time in face to face counseling 25 minutes.   Follow up in Pharmacist Clinic Visit 1 month.

## 2017-02-16 NOTE — Assessment & Plan Note (Signed)
Diabetes longstanding currently uncontrolled per CBGs and A1C. Patient denies hypoglycemic events and is able to verbalize appropriate hypoglycemia management plan. Patient reports adherence with medication. Control is suboptimal due to stress, dietary indiscretion, and sedentary lifestyle. Following discussion and approval by Dr. [name], the following medication changes were made:  -Continued basal insulin [name] (insulin degludec). Patient will continue to titrate 1 unit/ 3-4 days if fasting CBGs > 100mg /dl until fasting CBGs reach goal or next visit.  Has supply from manufacturer -Continued metformin XR 500 mg BID. -Next A1C anticipated 02/17/2017 or later.     #ASCVD risk greater than 7.5%. Continued Aspirin 325 mg and Continued simvastatin 40 mg.  -Can consider increasing to higher intensity statin in future visits given ASCVD risk score; however LDL well controlled at 57 mg/dL on 08/18/2016 labs.

## 2017-02-16 NOTE — Progress Notes (Signed)
I have reviewed the above note and agree.  Patient did note slight gas pains at times in his abdomen for several weeks.  He will pass gas and then feel better.  No other abdominal pain.  No vomiting or diarrhea.  No fevers.  No tenderness on exam.  Plan to discuss further at his upcoming visit. [name], M.D.

## 2017-02-18 ENCOUNTER — Ambulatory Visit (INDEPENDENT_AMBULATORY_CARE_PROVIDER_SITE_OTHER): Payer: Medicare Other | Admitting: Family Medicine

## 2017-02-18 ENCOUNTER — Ambulatory Visit (INDEPENDENT_AMBULATORY_CARE_PROVIDER_SITE_OTHER): Payer: Medicare Other

## 2017-02-18 ENCOUNTER — Encounter: Payer: Self-pay | Admitting: Family Medicine

## 2017-02-18 VITALS — BP 150/90 | HR 76 | Temp 98.4°F | Wt 276.2 lb

## 2017-02-18 DIAGNOSIS — M19011 Primary osteoarthritis, right shoulder: Secondary | ICD-10-CM | POA: Diagnosis not present

## 2017-02-18 DIAGNOSIS — M89319 Hypertrophy of bone, unspecified shoulder: Secondary | ICD-10-CM | POA: Diagnosis not present

## 2017-02-18 DIAGNOSIS — F419 Anxiety disorder, unspecified: Secondary | ICD-10-CM

## 2017-02-18 DIAGNOSIS — G4733 Obstructive sleep apnea (adult) (pediatric): Secondary | ICD-10-CM | POA: Diagnosis not present

## 2017-02-18 DIAGNOSIS — R109 Unspecified abdominal pain: Secondary | ICD-10-CM | POA: Diagnosis not present

## 2017-02-18 DIAGNOSIS — E1142 Type 2 diabetes mellitus with diabetic polyneuropathy: Secondary | ICD-10-CM | POA: Diagnosis not present

## 2017-02-18 DIAGNOSIS — I10 Essential (primary) hypertension: Secondary | ICD-10-CM

## 2017-02-18 LAB — CBC WITH DIFFERENTIAL/PLATELET
Basophils Absolute: 0 10*3/uL (ref 0.0–0.1)
Basophils Relative: 0.6 % (ref 0.0–3.0)
Eosinophils Absolute: 0.3 10*3/uL (ref 0.0–0.7)
Eosinophils Relative: 3.4 % (ref 0.0–5.0)
HCT: 40.3 % (ref 39.0–52.0)
Hemoglobin: 13 g/dL (ref 13.0–17.0)
Lymphocytes Relative: 21.4 % (ref 12.0–46.0)
Lymphs Abs: 1.6 10*3/uL (ref 0.7–4.0)
MCHC: 32.2 g/dL (ref 30.0–36.0)
MCV: 87.9 fl (ref 78.0–100.0)
Monocytes Absolute: 0.5 10*3/uL (ref 0.1–1.0)
Monocytes Relative: 6.9 % (ref 3.0–12.0)
Neutro Abs: 5.1 10*3/uL (ref 1.4–7.7)
Neutrophils Relative %: 67.7 % (ref 43.0–77.0)
Platelets: 225 10*3/uL (ref 150.0–400.0)
RBC: 4.58 Mil/uL (ref 4.22–5.81)
RDW: 15.2 % (ref 11.5–15.5)
WBC: 7.5 10*3/uL (ref 4.0–10.5)

## 2017-02-18 LAB — COMPREHENSIVE METABOLIC PANEL
ALT: 36 U/L (ref 0–53)
AST: 36 U/L (ref 0–37)
Albumin: 4.1 g/dL (ref 3.5–5.2)
Alkaline Phosphatase: 74 U/L (ref 39–117)
BUN: 14 mg/dL (ref 6–23)
CO2: 33 mEq/L — ABNORMAL HIGH (ref 19–32)
Calcium: 10.1 mg/dL (ref 8.4–10.5)
Chloride: 99 mEq/L (ref 96–112)
Creatinine, Ser: 1.4 mg/dL (ref 0.40–1.50)
GFR: 52.99 mL/min — ABNORMAL LOW (ref >60.00)
Glucose, Bld: 160 mg/dL — ABNORMAL HIGH (ref 70–99)
Potassium: 4.1 mEq/L (ref 3.5–5.1)
Sodium: 140 mEq/L (ref 135–145)
Total Bilirubin: 0.7 mg/dL (ref 0.2–1.2)
Total Protein: 7.1 g/dL (ref 6.0–8.3)

## 2017-02-18 MED ORDER — ALPRAZOLAM 0.5 MG PO TABS: 0.2500 mg | ORAL_TABLET | Freq: Every evening | ORAL | 1 refills | Status: DC | PRN

## 2017-02-18 NOTE — Assessment & Plan Note (Signed)
Discussed that his control currently is about as well controlled as possible given the medications he is on.  He will continue to monitor.

## 2017-02-18 NOTE — Assessment & Plan Note (Signed)
Suspect gas pains though does have left-sided tenderness today.  We will check a CBC for a white count.  If negative he will continue to monitor.  If worsens will be evaluated again.

## 2017-02-18 NOTE — Assessment & Plan Note (Signed)
Patient is interested in repeat sleep study given possibility of nasal pillows.  Order placed.

## 2017-02-18 NOTE — Progress Notes (Signed)
Douglas Rumps, MD Phone: [phone]  Douglas [name] is a 71 y.o. male who presents today for follow-up.  Hypertension: Typically 130/84.  Taking Lasix, losartan, and Cardura.  He is back on Cardura now and has had improvement in prior urinary leakage symptoms he had previously.  He is off of Flomax.  No lightheadedness, chest pain, or shortness of breath.  No edema.  Notes bilateral side pain that has started since he has been on the new insulin.  Notes it feels like gas discomfort.  He has been passing more gas.  No diarrhea or vomiting.  Patient reports he is on Xanax at night for anxiety.  He takes 0.5 mg nightly.  He is interested in decreasing the dose.  Patient notes for some time now he has noted that his right clavicle has become more prominent.  It has been growing in size.  He has never had this evaluated.  He does note dislocating it at some point in the past.  OSA: Not on CPAP as he was not able to tolerate the full facemask.  He does snore.  He wears 2 L of oxygen at night.  No apnea.  Neuropathy: Decently well controlled on Lyrica and Cymbalta.  Does use lidocaine topically at times.  Rarely takes hydrocodone.  Still has 20-25 tablets left from her prescription from a year and a half ago.  Social History   Tobacco Use  Smoking Status Never Smoker  Smokeless Tobacco Never Used     ROS see history of present illness  Objective  Physical Exam Vitals:   02/18/17 1313  BP: (!) 150/90  Pulse: 76  Temp: 98.4 F (36.9 C)  SpO2: 98%    BP Readings from Last 3 Encounters:  02/18/17 (!) 150/90  02/16/17 (!) 148/98  01/05/17 136/85   Wt Readings from Last 3 Encounters:  02/18/17 276 lb 3.2 oz (125.3 kg)  02/16/17 277 lb (125.6 kg)  01/05/17 282 lb 9.6 oz (128.2 kg)    Physical Exam  Constitutional: No distress.  Cardiovascular: Normal rate, regular rhythm and normal heart sounds.  Pulmonary/Chest: Effort normal and breath sounds normal.  Abdominal:  Soft. Bowel sounds are normal. He exhibits no distension. There is tenderness (left mid). There is no rebound and no guarding.  Musculoskeletal: He exhibits no edema.  Neurological: He is alert. Gait normal.  Skin: Skin is warm and dry. He is not diaphoretic.     Assessment/Plan: Please see individual problem list.  Essential hypertension Well-controlled at home.  Continue current regimen.  OSA (obstructive sleep apnea) Patient is interested in repeat sleep study given possibility of nasal pillows.  Order placed.  Diabetic neuropathy ([place]) Discussed that his control currently is about as well controlled as possible given the medications he is on.  He will continue to monitor.  Abdominal pain Suspect gas pains though does have left-sided tenderness today.  We will check a CBC for a white count.  If negative he will continue to monitor.  If worsens will be evaluated again.  Clavicle enlargement X-ray to start.  Could consider ultrasound if x-ray negative versus referral for evaluation.  Anxiety Patient interested in decreasing Xanax dose.  Refill given.  He will monitor for drowsiness.   [name] was seen today for establish care.  Diagnoses and all orders for this visit:  Abdominal pain, unspecified abdominal location -     CBC w/Diff -     Comp Met (CMET)  OSA (obstructive sleep apnea) -  Split night study; Future  Clavicle enlargement -     DG Clavicle Right; Future  Essential hypertension  Diabetic polyneuropathy associated with type 2 diabetes mellitus (HCC)  Anxiety  Other orders -     ALPRAZolam (XANAX) 0.5 MG tablet; Take 0.5 tablets (0.25 mg total) at bedtime as needed by mouth for anxiety.    Orders Placed This Encounter  Procedures  . DG Clavicle Right    Standing Status:   Future    Number of Occurrences:   1    Standing Expiration Date:   04/20/2018    Order Specific Question:   Reason for Exam (SYMPTOM  OR DIAGNOSIS REQUIRED)    Answer:   right  clavicle prominence getting bigger    Order Specific Question:   Preferred imaging location?    Answer:   [facility] Specific Question:   Radiology Contrast Protocol - do NOT remove file path    Answer:   [email or web address]  . CBC w/Diff  . Comp Met (CMET)  . Split night study    Standing Status:   Future    Standing Expiration Date:   02/18/2018    Order Specific Question:   Where should this test be performed:    Answer:   [facility]    Meds ordered this encounter  Medications  . ALPRAZolam (XANAX) 0.5 MG tablet    Sig: Take 0.5 tablets (0.25 mg total) at bedtime as needed by mouth for anxiety.    Dispense:  30 tablet    Refill:  1    This request is for a new prescription for a controlled substance as required by Federal/State law.Douglas Rumps, MD [place]

## 2017-02-18 NOTE — Assessment & Plan Note (Signed)
X-ray to start.  Could consider ultrasound if x-ray negative versus referral for evaluation.

## 2017-02-18 NOTE — Assessment & Plan Note (Addendum)
Patient interested in decreasing Xanax dose.  Refill given.  He will monitor for drowsiness.

## 2017-02-18 NOTE — Assessment & Plan Note (Signed)
Well-controlled at home.  Continue current regimen. 

## 2017-02-18 NOTE — Patient Instructions (Signed)
Nice to see you. Please continue to monitor your neuropathy. We will decrease your alprazolam dose to 0.25 mg nightly. We will get you set up for a sleep study. We will contact you with your lab work. We will contact you with your x-ray results.

## 2017-02-19 ENCOUNTER — Telehealth: Payer: Self-pay | Admitting: Family Medicine

## 2017-02-19 ENCOUNTER — Other Ambulatory Visit: Payer: Self-pay | Admitting: Family Medicine

## 2017-02-19 DIAGNOSIS — M89319 Hypertrophy of bone, unspecified shoulder: Secondary | ICD-10-CM

## 2017-02-19 NOTE — Telephone Encounter (Signed)
Copied from [place] #[id number]. Topic: Quick Communication - See Telephone Encounter >> Feb 19, 2017 10:56 AM Burnis Medin, NT wrote: CRM for notification. See Telephone encounter for: Pt. Is calling back to speak with Anderson Malta. Pt. Wants a call back.  02/19/17.

## 2017-02-23 ENCOUNTER — Encounter: Payer: Self-pay | Admitting: Family Medicine

## 2017-03-09 NOTE — Progress Notes (Signed)
Douglas Douglas [name] [name], [name] [address] Phone: [phone] Subjective:    I'm seeing this patient by the request  of:  [name], MD   CC: arm pain   ERX:[id number]  Douglas [name] is a 71 y.o. male coming in with complaint of right arm pain.  Patient states and has noted some enlargement of the right clavicle region.    He did have x-rays taken November 8 these were independently visualized by me.  Patient did have arthritic changes of the acromioclavicular joint noted.  He also had a soft tissue calcification lateral to the lateral margin of the acromion.   He states he had a major surgery in his right shoulder back in 93/94. He is concerned about calcification. Has a history of bone cancer in his family. Would like to talk about knee injections. [place] last Saturday injuring both knees and his back.    Patient is having increasing knee pain again.  Patient did have a fall.  Feels like that exacerbated it.  Was doing much better overall.  Starting to have increasing discomfort and pain and instability again.  Past Medical History:  Diagnosis Date  . Arthritis   . Depression   . Diabetes mellitus without complication ([place])   . Edema    feet/legs  . GERD (gastroesophageal reflux disease)   . Gout   . Hypertension   . Hypothyroidism   . Kidney cysts    renal failure 2013  . Kidney stones    Dr. Rogers Blocker  . Neuropathy    diabetes  . OSA (obstructive sleep apnea)    supplemental oxygen at night  . Oxygen dependent    hs  . S/P cardiac cath 1998   [facility]  . Syncope and collapse   . Temporary low platelet count (HCC)    Past Surgical History:  Procedure Laterality Date  . ANAL FISSURE REPAIR    . BACK SURGERY     rupture disc lumbar spine  . CARDIAC CATHETERIZATION    . CATARACT EXTRACTION W/PHACO Right 07/16/2016   Procedure: CATARACT EXTRACTION PHACO AND INTRAOCULAR LENS PLACEMENT (IOC);  Surgeon: [name], MD;   Location: [facility] ORS;  Service: Ophthalmology;  Laterality: Right;  [place] 01:11 AP% 17.6 CDE 24.83 fluid pack lot # [id number]  . CATARACT EXTRACTION W/PHACO Left 08/13/2016   Procedure: CATARACT EXTRACTION PHACO AND INTRAOCULAR LENS PLACEMENT ([place]) suture placed in left eye at end of procedure;  Surgeon: [name], MD;  Location: [facility] ORS;  Service: Ophthalmology;  Laterality: Left;  [place] 01:55 AP% 22.7 CDE 53.56 fluid pack lot # [id number]  . FINGER AMPUTATION     partial  . KNEE SURGERY     arthroscopy  . SHOULDER SURGERY Bilateral    arthroscopic right, rotator cuff repair left   Social History   Socioeconomic History  . Marital status: Married    Spouse name: [name]  . Number of children: 3  . Years of education: 5  . Highest education level: None  Social Needs  . Financial resource strain: None  . Food insecurity - worry: None  . Food insecurity - inability: None  . Transportation needs - medical: None  . Transportation needs - non-medical: None  Occupational History    Comment: retired  Tobacco Use  . Smoking status: Never Smoker  . Smokeless tobacco: Never Used  Substance and Sexual Activity  . Alcohol use: No  . Drug use: No  . Sexual  activity: Yes  Other Topics Concern  . None  Social History Narrative   Lives in [place] with his wife ([name]). No children. Three step children. No pets.      Work - Patient is retired. Maintenance supervisor.      School - One year college education.      Right handed.      [place], served in [place], no combat               Allergies  Allergen Reactions  . Bee Venom Swelling  . Atenolol Other (See Comments)    Did not regulate blood pressure  . Benadryl [Diphenhydramine Hcl (Sleep)] Other (See Comments)    Hyperactivity   . Enalapril Itching    itching  . Gabapentin Palpitations and Rash    rash  . Hctz [Hydrochlorothiazide] Other (See Comments)    Decreased potassium   Family  History  Problem Relation Age of Onset  . Breast cancer Mother   . Lung cancer Mother   . Bone cancer Mother   . Heart Problems Mother   . Cancer Mother        breast, lung and rib  . AAA (abdominal aortic aneurysm) Mother   . Heart disease Mother   . Heart attack Father   . Heart disease Father   . Heart attack Brother   . Cancer Brother        esophageal  . Colon cancer Neg Hx      Past medical history, social, surgical and family history all reviewed in electronic medical record.  No pertanent information unless stated regarding to the chief complaint.   Review of Systems:Review of systems updated and as accurate as of 03/10/17  No headache, visual changes, nausea, vomiting, diarrhea, constipation, dizziness, abdominal pain, skin rash, fevers, chills, night sweats, weight loss, swollen lymph nodes, body aches, joint swelling, muscle aches, chest pain, shortness of breath, mood changes.  Positive muscle aches  Objective  Blood pressure 140/80, pulse 84, height 5\' 11"  (1.803 m), weight 272 lb (123.4 kg), SpO2 94 %. Systems examined below as of 03/10/17   General: No apparent distress alert and oriented x3 mood and affect normal, dressed appropriately.  HEENT: Pupils equal, extraocular movements intact  Respiratory: Patient's speak in full sentences and does not appear short of breath  Cardiovascular: No lower extremity edema, non tender, no erythema  Skin: Warm dry intact with no signs of infection or rash on extremities or on axial skeleton.  Abdomen: Soft nontender  Neuro: Cranial nerves II through XII are intact, neurovascularly intact in all extremities with 2+ DTRs and 2+ pulses.  Lymph: No lymphadenopathy of posterior or anterior cervical chain or axillae bilaterally.  Gait normal with good balance and coordination.  MSK:  Non tender with full range of motion and good stability and symmetric strength and tone of  elbows, wrist, hip, and ankles bilaterally.  Shoulder:  Right Inspection reveals no abnormalities, atrophy or asymmetry mild enlargement of the clavicle compared to the previous visit as well as the contralateral side. Palpation is normal with no tenderness over AC joint or bicipital groove. ROM is full in all planes. Rotator cuff strength normal throughout. No signs of impingement with negative Neer and Hawkin's tests, empty can sign. Speeds and Yergason's tests normal. No labral pathology noted with negative Obrien's, negative clunk and good stability. Normal scapular function observed. No painful arc and no drop arm sign. No apprehension sign   Limited musculoskeletal  ultrasound was performed and interpreted by Lyndal Pulley  Limited ultrasound patient's right clavicle did not show any significant bony abnormality.  Patient does have some swelling and arthritic changes of the acromioclavicular joint.  Otherwise fairly unremarkable.  Knee: Bilateral valgus deformity noted. Large thigh to calf ratio.  Tender to palpation over medial and PF joint line.  ROM full in flexion and extension and lower leg rotation. instability with valgus force.  painful patellar compression. Patellar glide with moderate crepitus. Patellar and quadriceps tendons unremarkable. Hamstring and quadriceps strength is normal.   After informed written and verbal consent, patient was seated on exam table. Right knee was prepped with alcohol swab and utilizing anterolateral approach, patient's right knee space was injected with 4:1  marcaine 0.5%: Kenalog 40mg /dL. Patient tolerated the procedure well without immediate complications.  After informed written and verbal consent, patient was seated on exam table. Left knee was prepped with alcohol swab and utilizing anterolateral approach, patient's left knee space was injected with 4:1  marcaine 0.5%: Kenalog 40mg /dL. Patient tolerated the procedure well without immediate complications.   Impression and Recommendations:       This case required medical decision making of moderate complexity.      Note: This dictation was prepared with Dragon dictation along with smaller phrase technology. Any transcriptional errors that result from this process are unintentional.

## 2017-03-10 ENCOUNTER — Encounter: Payer: Self-pay | Admitting: Family Medicine

## 2017-03-10 ENCOUNTER — Ambulatory Visit: Payer: Self-pay

## 2017-03-10 ENCOUNTER — Ambulatory Visit (INDEPENDENT_AMBULATORY_CARE_PROVIDER_SITE_OTHER): Payer: Medicare Other | Admitting: Family Medicine

## 2017-03-10 ENCOUNTER — Other Ambulatory Visit: Payer: Self-pay | Admitting: Family Medicine

## 2017-03-10 VITALS — BP 140/80 | HR 84 | Ht 71.0 in | Wt 272.0 lb

## 2017-03-10 DIAGNOSIS — M170 Bilateral primary osteoarthritis of knee: Secondary | ICD-10-CM | POA: Diagnosis not present

## 2017-03-10 DIAGNOSIS — M89319 Hypertrophy of bone, unspecified shoulder: Secondary | ICD-10-CM | POA: Diagnosis not present

## 2017-03-10 DIAGNOSIS — M25511 Pain in right shoulder: Secondary | ICD-10-CM

## 2017-03-10 DIAGNOSIS — E118 Type 2 diabetes mellitus with unspecified complications: Secondary | ICD-10-CM

## 2017-03-10 NOTE — Assessment & Plan Note (Signed)
Bilateral injections given to him today.  He tolerated the procedure well.  We discussed icing regimen and home exercise.  Discussed which activities to do which wants to avoid.  Patient is to increase activity slowly over the course the next several days.  Follow-up with me again in 4 weeks.  Weeks patient could respond well to the [place] supplementation again

## 2017-03-10 NOTE — Assessment & Plan Note (Signed)
On ultrasound as well as x-ray no significant findings that are consistent with what patient is seen.  I believe the patient has some rotation of the girdle.  Patient may have even had a possible fracture of the clavicle previously.  Do not feel that further workup is necessary at this time without having no pain and complete range of motion of the shoulder.  Patient will come back if any significant changes occur.

## 2017-03-10 NOTE — Patient Instructions (Signed)
Good to see you  Lets do the steroid injections again  Orthovisc could be done again after the 18th if you would want.  Watch the clavicle but looks good See me again in 4 weeks  Happy holidays!

## 2017-03-23 ENCOUNTER — Ambulatory Visit: Payer: Medicare Other | Admitting: Pharmacist

## 2017-03-30 ENCOUNTER — Encounter: Payer: Self-pay | Admitting: Pharmacist

## 2017-03-30 ENCOUNTER — Ambulatory Visit (INDEPENDENT_AMBULATORY_CARE_PROVIDER_SITE_OTHER): Payer: Medicare Other | Admitting: Pharmacist

## 2017-03-30 DIAGNOSIS — E118 Type 2 diabetes mellitus with unspecified complications: Secondary | ICD-10-CM | POA: Diagnosis not present

## 2017-03-30 MED ORDER — METFORMIN HCL ER 500 MG PO TB24: 500.0000 mg | ORAL_TABLET | Freq: Two times a day (BID) | ORAL | 3 refills | Status: DC

## 2017-03-30 MED ORDER — LOSARTAN POTASSIUM 25 MG PO TABS: 25.0000 mg | ORAL_TABLET | Freq: Every day | ORAL | 1 refills | Status: DC

## 2017-03-30 NOTE — Assessment & Plan Note (Signed)
#  Hypertension longstanding, reasonably controlled in office and well controlled-to-low per home readings. Patient with h/o falls and dizziness. No return of BPH urinary sx since self-dc of doxazosin. Patient reports adherence with losartan.  - Decrease losartan to 25 mg daily. This plan was discussed with Dr. [name] who agrees to 50% decrease. - D/c doxazosin - if urinary sx return, recommend restart tamsulosin instead as less likely to cause hypotension.

## 2017-03-30 NOTE — Patient Instructions (Addendum)
Thanks for coming to see me!   1. Increase your Douglas [name] to 33 units once a day, then continue titrating by 1 unit every 3-4 days until fasting blood sugars are 100-130s.   2. I sent a 90 day supply for your metformin  3. STOP taking your doxazosin  4. Decrease your losartan 25 mg once a day. If you have any more low blood pressures in 80s/50s, call the office. If your blood pressure starts going up at home (higher than 130s-140s/90s-100s).   Come back to see me in January, we will take your A1C then.

## 2017-03-30 NOTE — Progress Notes (Signed)
    S:     No chief complaint on file.   Patient arrives in good spirits.  Presents for diabetes evaluation, education, and management at the request of Dr. Marisa Severin. Patient was referred on 08/19/16.  Patient was last seen by Primary Care Provider on 02/18/17 - at that time no changes were made to DM regimen. Last Rx Clinic visit on 02/16/17 - at that time Tyler Aas was continued to be titrated and metformin was continued, tamsulosin was dc'd as pt requested to remain on doxazosin.   Today patient reports his sugars are higher than usual as he got intraarticular steroid injections in knees at end of November. Patient reports he has continued dietary modifications and is avoiding sugary drinks and candy for the most part. He brings in CBG log and is up to [place] 30 units once daily. He reports he had a few days of hypotension (80s/50s) and he has stopped taking doxazosin. Denies issues urinating since d/c of doxazosin. Remains on losartan 50 mg daily. No falls but does report dizziness. Walking 10-15 minutes almost daily.   Patient reports adherence with medications.  Current diabetes medications include: [name] 30 units daily - titrating, metformin XR 500 mg BID Current hypertension medications include: losartan 50 mg daily, doxazosin  Patient denies hypoglycemic events.  Patient reported dietary habits: Eats 2-3 meals/day Breakfast: cheerios + milk  Lunch: sandwich - either tomato or pimento cheese Dinner: Meat + 2 vegetables Snacks: nuts Drinks: water   O:  Physical Exam  Constitutional: He appears well-developed and well-nourished.     Review of Systems  All other systems reviewed and are negative.    Lab Results  Component Value Date   HGBA1C 7.4 (H) 11/17/2016   Vitals:   03/30/17 1138  BP: (!) 142/91  Pulse: 73   Home fasting CBG: 140s-220s, excursions to 126, 138 2 hour post-prandial/random CBG: 170s-200s Blood pressures -  80s-120s/50s-70s  A/P: #Diabetes longstanding currently worsened after steroid injections. Patient denies hypoglycemic events and is able to verbalize appropriate hypoglycemia management plan. Patient reports adherence with medication. Control is suboptimal due to ADE of steroids. - Increased [name] to 33 units once daily, then continue titration by 1 unit/3-4 days until CBGs are in low 100s. Still has full box from manufacturer.  - Continue metformin XR 500 mg BID - Next A1C anticipated at next visit - did not draw today as we know his A1C has likely worsened and patient is willing to continue titrating insulin.   #ASCVD risk greater than 7.5%.ContinuedAspirin 325mg  and Continuedsimvastatin40mg .  -Can consider increasing to higher intensity statin in future visits given ASCVD risk score; however LDL well controlled at 57mg /dLon 08/18/2016 labs.  #Hypertension longstanding, reasonably controlled in office and well controlled-to-low per home readings. Patient with h/o falls and dizziness. No return of BPH urinary sx since self-dc of doxazosin. Patient reports adherence with losartan.  - Decrease losartan to 25 mg daily. This plan was discussed with Dr. [name] who agrees to 50% decrease. - D/c doxazosin - if urinary sx return, recommend restart tamsulosin instead as less likely to cause hypotension.   Written patient instructions provided.  Total time in face to face counseling 25 minutes.    Follow up in Pharmacist Clinic Visit Jan 2019.     Carlean Jews, Pharm.D., BCPS, CPP PGY2 Ambulatory Care Pharmacy Resident Phone: [phone]

## 2017-03-30 NOTE — Assessment & Plan Note (Signed)
#  Diabetes longstanding currently worsened after steroid injections. Patient denies hypoglycemic events and is able to verbalize appropriate hypoglycemia management plan. Patient reports adherence with medication. Control is suboptimal due to ADE of steroids. - Increased [name] to 33 units once daily, then continue titration by 1 unit/3-4 days until CBGs are in low 100s. Still has full box from manufacturer.  - Continue metformin XR 500 mg BID - Next A1C anticipated at next visit - did not draw today as we know his A1C has likely worsened and patient is willing to continue titrating insulin.   #ASCVD risk greater than 7.5%.ContinuedAspirin 325mg  and Continuedsimvastatin40mg .  -Can consider increasing to higher intensity statin in future visits given ASCVD risk score; however LDL well controlled at 57mg /dLon 08/18/2016 labs.

## 2017-04-01 ENCOUNTER — Other Ambulatory Visit: Payer: Self-pay | Admitting: Neurology

## 2017-04-01 NOTE — Progress Notes (Signed)
I have reviewed the above note and agree. I was available to the pharmacist for consultation.  Shyler Holzman, MD  

## 2017-04-03 ENCOUNTER — Ambulatory Visit: Payer: Medicare Other | Attending: Neurology

## 2017-04-03 DIAGNOSIS — F329 Major depressive disorder, single episode, unspecified: Secondary | ICD-10-CM | POA: Diagnosis not present

## 2017-04-03 DIAGNOSIS — G4733 Obstructive sleep apnea (adult) (pediatric): Secondary | ICD-10-CM | POA: Insufficient documentation

## 2017-04-03 DIAGNOSIS — I10 Essential (primary) hypertension: Secondary | ICD-10-CM | POA: Insufficient documentation

## 2017-04-03 DIAGNOSIS — R0683 Snoring: Secondary | ICD-10-CM | POA: Diagnosis not present

## 2017-04-03 DIAGNOSIS — F5101 Primary insomnia: Secondary | ICD-10-CM | POA: Diagnosis not present

## 2017-04-03 DIAGNOSIS — Z6837 Body mass index (BMI) 37.0-37.9, adult: Secondary | ICD-10-CM | POA: Diagnosis not present

## 2017-04-08 ENCOUNTER — Other Ambulatory Visit: Payer: Self-pay | Admitting: Internal Medicine

## 2017-04-20 DIAGNOSIS — E119 Type 2 diabetes mellitus without complications: Secondary | ICD-10-CM | POA: Diagnosis not present

## 2017-04-20 DIAGNOSIS — D3131 Benign neoplasm of right choroid: Secondary | ICD-10-CM | POA: Diagnosis not present

## 2017-04-20 LAB — HM DIABETES EYE EXAM

## 2017-04-22 ENCOUNTER — Encounter: Payer: Self-pay | Admitting: Family Medicine

## 2017-04-24 ENCOUNTER — Other Ambulatory Visit: Payer: Self-pay | Admitting: Family Medicine

## 2017-05-04 ENCOUNTER — Ambulatory Visit (INDEPENDENT_AMBULATORY_CARE_PROVIDER_SITE_OTHER): Payer: Medicare Other | Admitting: Pharmacist

## 2017-05-04 ENCOUNTER — Encounter: Payer: Self-pay | Admitting: Pharmacist

## 2017-05-04 VITALS — BP 144/62 | Wt 274.0 lb

## 2017-05-04 DIAGNOSIS — E1142 Type 2 diabetes mellitus with diabetic polyneuropathy: Secondary | ICD-10-CM

## 2017-05-04 LAB — POCT GLYCOSYLATED HEMOGLOBIN (HGB A1C): Hemoglobin A1C: 7.8

## 2017-05-04 MED ORDER — INSULIN GLARGINE 100 UNIT/ML SOLOSTAR PEN: PEN_INJECTOR | SUBCUTANEOUS | 11 refills | Status: DC

## 2017-05-04 MED ORDER — PEN NEEDLES 32G X 5 MM MISC: 30.0000 "pen " | Freq: Every day | 11 refills | Status: DC

## 2017-05-04 NOTE — Progress Notes (Signed)
S:     Chief Complaint  Patient presents with  . Medication Management    DM    Patient arrives ambulatory and in good spirits.  Presents for diabetes evaluation, education, and management  (referred by Dr. [name] on 08/19/2016). Last seen by primary care provider on 02/18/2017. Last Rx Clinic visit on 03/30/2018 - at that time Tyler Aas was increased, doxazosin was stopped (again) and losartan was decreased. Patient also requested renewal of Lyrica Patient Assistance Application.   Today, patient denies complete adherence to losartan decrease and is currently using 25-50 mg daily.  Patient describes experiencing recent falls out of bed in his sleep x2 since last visit. Has also self-restarted doxazosin and d/c'd tamsulosin 2/2 urinary hesitancy. Improved with change. Denies falls/syncope. Reports h/o UTIs several years ago. Checks CBGs when he first wakes up. Has titrated [name] up as directed to 52 units with little change in CBGs.  Insurance coverage/medication affordability: BCBS Medicare Part D Tyler Aas non formulary, Lantus formulary)  Patient denies adherence with medications (losartan, tamsulosin, doxazosin).  Current diabetes medications include:[name] 52 units daily, Metformin XR 518m two times daily.  Current hypertension medications include: Losartan 24mdaily (taking 25 or 5055m furosemide 40 mg daily  Patient denies hypoglycemic events.  Patient reported dietary habits: Patient reports he continues to drink 32oz gatorade approximately 4 times a week and reports to snack on Milkyway chocolate bars in the evening 2x/week.  Patient reports eating red lobster biscuits and lots of bread. Overall continues to endorse a large carbohydrate driven diet. Patient reports neuropathy and blurred vision but without discernable relationship to worsening diet and uncontrolled diabetes.   Patient reported predominantly sedentary lifestyle   Patient reports nocturia- 1 time/ night    Patient denies pain/burning on urination.  Patient reports neuropathy. Patient reports visual changes- not related to DM per patient. Patient reports self foot exams.   O:  Physical Exam  Constitutional: He appears well-developed and well-nourished.     Review of Systems  All other systems reviewed and are negative.    Lab Results  Component Value Date   HGBA1C 7.8 05/04/2017   Vitals:   05/04/17 1147  BP: (!) 144/62    Lipid Panel     Component Value Date/Time   CHOL 132 08/18/2016 1345   TRIG 143.0 08/18/2016 1345   HDL 46.70 08/18/2016 1345   CHOLHDL 3 08/18/2016 1345   VLDL 28.6 08/18/2016 1345   LDLCALC 57 08/18/2016 1345    Home fasting CBG: 120-140's while on 48-52 units of [name]   2 hour post-prandial/random CBG: patient doesn't bring in readings  Blood pressures: 110s-140s/60s-70s  Clinical ASCVD: Yes ;High-risk conditions: Age 72 44 older, DM and HTN.   A/P: Diabetes longstanding diagnosed currently uncontrolled as evidenced by worsened A1C 7.8. Patient denies hypoglycemic events and is able to verbalize appropriate hypoglycemia management plan. Patient reports poor adherence with HTN/BPH medication but good adherence with DM medication. Control is suboptimal due to reported sedentary lifestyle and a worsened diet, also suspect some degree of resistance to tresiba, specifically as he had a much more robust response to [place] the past and required much lower doses of insulin. - Initiate lantus 20 units, titrate by 1 unit daily until FBG in low 100s  - patient hesitant to start at ANY higher of a dose.   - D/c tresiba  - Continue metformin XR 500 mg daily  - Next A1C anticipated 07/02/2017.    ASCVD risk - primary  prevention, patient age 72 years old within 50-19 year old with DM, baseline LDL 70-189, multiple ASCVD risk factors. Last LDL well controlled at 57 mg/dL on 08/18/2016 labs. -Continue simvastatin 92m at bedtime - Continue aspirin 836m   Hypertension longstanding diagnosed currently suboptimal as evidenced by reported elevated blood pressure readings at home and in office.  Patient denies adherence with medication. Control is suboptimal due to nonadherent reported losartan 2558mR losartan 23m15m- Continue losartan 25mg65mly  -Patient reported continued doxazosin 8mg d37my - Counseled patient on importance of maintaining and continuing losartan 25mg d46mONLY - Counseled on continue blood pressure readings 2 hour after medication   #Medication Assistance - Patient previously obtaining medication assistance for Lyrica, [name].  -Filled out new application for Lyrica.  -Has not met out of pocket spend to be eligible for [name] patient assistance again -[name] changed to Lantus for formulary preference  Written patient instructions provided.  Total time in face to face counseling 35 minutes.    Follow up in Pharmacist Clinic Visit in 3-4 weeks.   Patient seen with Medina Felicity Pellegrini Candidate 2019  [address].DFloridaPS, CPP PGY2 Ambulatory Care Pharmacy Resident Phone: [phone]

## 2017-05-04 NOTE — Assessment & Plan Note (Signed)
   Hypertension longstanding diagnosed currently suboptimal as evidenced by reported elevated blood pressure readings at home and in office.  Patient denies adherence with medication. Control is suboptimal due to nonadherent reported losartan 25mg  OR losartan 50mg .  - Continue losartan 25mg  daily  -Patient reported continued doxazosin 8mg  daily - Counseled patient on importance of maintaining and continuing losartan 25mg  dose ONLY - Counseled on continue blood pressure readings 2 hour after medication

## 2017-05-04 NOTE — Assessment & Plan Note (Signed)
Diabetes longstanding diagnosed currently uncontrolled as evidenced by worsened A1C 7.8. Patient denies hypoglycemic events and is able to verbalize appropriate hypoglycemia management plan. Patient reports poor adherence with HTN/BPH medication but good adherence with DM medication. Control is suboptimal due to reported sedentary lifestyle and a worsened diet, also suspect some degree of resistance to tresiba, specifically as he had a much more robust response to [place] in the past and required much lower doses of insulin. - Initiate lantus 20 units, titrate by 1 unit daily until FBG in low 100s  - patient hesitant to start at ANY higher of a dose.   - D/c tresiba  - Continue metformin XR 500 mg daily  - Next A1C anticipated 07/02/2017.    ASCVD risk - primary prevention, patient age 72 years old within 50-65 year old with DM, baseline LDL 70-189, multiple ASCVD risk factors. Last LDL well controlled at 57 mg/dL on 08/18/2016 labs. -Continue simvastatin 40mg  at bedtime - Continue aspirin 81mg 

## 2017-05-04 NOTE — Patient Instructions (Addendum)
Thanks for coming to see [place] today! Your A1C is slightly worse. We are going to make some changes to your insulin regimen. There is a chance that you need a new medication. We will discuss this next time after your work on your diet and we change your insulin.   1. STOP [name] 2. START lantus 20 units daily, increase by 1 unit EVERY DAY until your fasting blood sugars are in the low 100s.  3. Check your blood sugar 2 hours after a large meal a couple times a week 4. DO NOT take more than losartan 25 mg every day. I will let Dr. [name] know that you have restarted the doxazosin and stopped the tamsulosin 5. We will send off your application for Lyrica.   Come back to see me in 3-4 weeks. I will call you next week to check on your insulin dose and blood sugars.

## 2017-05-10 NOTE — Progress Notes (Signed)
I have reviewed the above note and agree. I was available to the pharmacist for consultation.  [place] Tsou, MD  

## 2017-05-18 ENCOUNTER — Other Ambulatory Visit: Payer: Self-pay | Admitting: Family Medicine

## 2017-05-18 ENCOUNTER — Other Ambulatory Visit: Payer: Self-pay | Admitting: Pharmacist

## 2017-05-18 MED ORDER — LYRICA 100 MG PO CAPS: 100.0000 mg | ORAL_CAPSULE | Freq: Three times a day (TID) | ORAL | 3 refills | Status: DC

## 2017-05-19 ENCOUNTER — Telehealth: Payer: Self-pay | Admitting: Pharmacist

## 2017-05-19 DIAGNOSIS — N183 Chronic kidney disease, stage 3 (moderate): Secondary | ICD-10-CM | POA: Diagnosis not present

## 2017-05-19 DIAGNOSIS — I10 Essential (primary) hypertension: Secondary | ICD-10-CM | POA: Diagnosis not present

## 2017-05-19 DIAGNOSIS — D631 Anemia in chronic kidney disease: Secondary | ICD-10-CM | POA: Diagnosis not present

## 2017-05-19 DIAGNOSIS — N2581 Secondary hyperparathyroidism of renal origin: Secondary | ICD-10-CM | POA: Diagnosis not present

## 2017-05-19 NOTE — Telephone Encounter (Signed)
Called patient to check on CBGs and insulin titration. Patient reports he [place] up to 35 units of Lantus.  Fastings read to me: 151, 154, 146, 155, 123, 152, 136, 138 2hPP/randoms read to me: 182, 202, 200, 200, 183, 182 Denies hypoglycemia or falls/dizziness.   Advised patient to increase to 38 units of Lantus and continue titration, no more than 60 units/day. Patient verbalized understanding. F/u scheduled with me on 06/08/2017.  Carlean Jews, Pharm.D., BCPS PGY2 Ambulatory Care Pharmacy Resident Phone: [phone]

## 2017-05-20 DIAGNOSIS — N183 Chronic kidney disease, stage 3 (moderate): Secondary | ICD-10-CM | POA: Diagnosis not present

## 2017-05-20 NOTE — Telephone Encounter (Signed)
[place] pt °

## 2017-05-22 ENCOUNTER — Other Ambulatory Visit: Payer: Self-pay

## 2017-05-22 ENCOUNTER — Ambulatory Visit (INDEPENDENT_AMBULATORY_CARE_PROVIDER_SITE_OTHER): Payer: Medicare Other | Admitting: Family Medicine

## 2017-05-22 ENCOUNTER — Encounter: Payer: Self-pay | Admitting: Family Medicine

## 2017-05-22 VITALS — BP 150/90 | HR 80 | Temp 98.0°F | Wt 270.2 lb

## 2017-05-22 DIAGNOSIS — M1A3790 Chronic gout due to renal impairment, unspecified ankle and foot, without tophus (tophi): Secondary | ICD-10-CM

## 2017-05-22 DIAGNOSIS — G4733 Obstructive sleep apnea (adult) (pediatric): Secondary | ICD-10-CM

## 2017-05-22 DIAGNOSIS — N401 Enlarged prostate with lower urinary tract symptoms: Secondary | ICD-10-CM

## 2017-05-22 DIAGNOSIS — E1142 Type 2 diabetes mellitus with diabetic polyneuropathy: Secondary | ICD-10-CM

## 2017-05-22 DIAGNOSIS — R3911 Hesitancy of micturition: Secondary | ICD-10-CM

## 2017-05-22 DIAGNOSIS — I10 Essential (primary) hypertension: Secondary | ICD-10-CM | POA: Diagnosis not present

## 2017-05-22 NOTE — Patient Instructions (Signed)
Nice to see you. We will get you set up with a CPAP.  If you do not hear anything regarding this in the next week please let us know. You can continue titrating your Lantus as advised by Chrys Racer.

## 2017-05-24 NOTE — Assessment & Plan Note (Signed)
Cardura works quite well though he has excessive lightheadedness and it affects his blood pressure.  We will get him referred to urology for further evaluation.

## 2017-05-24 NOTE — Assessment & Plan Note (Signed)
Stable on allopurinol

## 2017-05-24 NOTE — Assessment & Plan Note (Signed)
Patient is on maximum treatment for this.  He will continue his current regimen.

## 2017-05-24 NOTE — Assessment & Plan Note (Signed)
Discussed sleep study results with him.  We were able to get him on the phone with the company to get this set up while he was in the office.

## 2017-05-24 NOTE — Assessment & Plan Note (Signed)
No changes made today.  He will continue to titrate his Lantus until fasting CBGs are less than 130.  Continue metformin.

## 2017-05-24 NOTE — Assessment & Plan Note (Signed)
Varies widely.  We will make no changes given his lightheadedness.  Discussed having him see urology to figure out an alternative option for his Cardura.

## 2017-05-24 NOTE — Progress Notes (Signed)
[name], MD Phone: [phone]  Douglas [name] is a 72 y.o. male who presents today for f/u.  HYPERTENSION  Disease Monitoring  Home BP Monitoring 98-130s/52-80s  Chest pain- no    Dyspnea- no Medications  Compliance-  Taking cardura, losartan. Lightheadedness-  Yes when he goes to stand only when taking cardura   DIABETES Disease Monitoring: Blood Sugar ranges-123-263, mostly <200 Polyuria/phagia/dipsia- some polyuria with his BPH       Medications: Compliance- taking metformin, Lantus 39 units, he is titrating up on the Lantus until his fasting CBGs are less than 130 hypoglycemic symptoms- no  Neuropathy: Patient takes Cymbalta and Lyrica and uses lidocaine patches.  Notes he still has symptoms.  Does have some numbness.  Has had tibial aspect ingrown toenail removals in bilateral feet with callus in those areas.  Gout: Taking allopurinol.  Does not take the colchicine unless he has a flare.  No recent flares.  He had a sleep study though when he was contacted to set up the CPAP he opted to wait until he saw me in the office.  BPH: Patient notes he has no symptoms when he takes a doxazosin.  He does have lightheadedness when he takes the doxazosin.  If he stops it he dribbles excessively.   Social History   Tobacco Use  Smoking Status Never Smoker  Smokeless Tobacco Never Used     ROS see history of present illness  Objective  Physical Exam Vitals:   05/22/17 1433  BP: (!) 150/90  Pulse: 80  Temp: 98 F (36.7 C)  SpO2: 98%    BP Readings from Last 3 Encounters:  05/22/17 (!) 150/90  05/04/17 (!) 144/62  03/30/17 (!) 142/91   Wt Readings from Last 3 Encounters:  05/22/17 270 lb 3.2 oz (122.6 kg)  05/04/17 274 lb (124.3 kg)  03/30/17 271 lb (122.9 kg)    Physical Exam  Constitutional: No distress.  Cardiovascular: Normal rate, regular rhythm and normal heart sounds.  Pulmonary/Chest: Effort normal and breath sounds normal.    Musculoskeletal: He exhibits no edema.  Neurological: He is alert. Gait normal.  Skin: Skin is warm and dry. He is not diaphoretic.   Diabetic Foot Exam - Simple   Simple Foot Form Diabetic Foot exam was performed with the following findings:  Yes 05/22/2017  3:12 PM  Visual Inspection See comments:  Yes Sensation Testing See comments:  Yes Pulse Check Posterior Tibialis and Dorsalis pulse intact bilaterally:  Yes Comments Small callus noted over tibial aspect edge of toenail great toes bilateral feet, cyst noted over right heel (patient reports he seen dermatology and they advised not to remove this), otherwise no deformities noted, absent monofilament sensation bilaterally, intact light touch, intact PT and DP pulses     Assessment/Plan: Please see individual problem list.  Essential hypertension Varies widely.  We will make no changes given his lightheadedness.  Discussed having him see urology to figure out an alternative option for his Cardura.  OSA (obstructive sleep apnea) Discussed sleep study results with him.  We were able to get him on the phone with the company to get this set up while he was in the office.  DM type 2 with diabetic peripheral neuropathy (HCC) No changes made today.  He will continue to titrate his Lantus until fasting CBGs are less than 130.  Continue metformin.  Diabetic neuropathy ([place]) Patient is on maximum treatment for this.  He will continue his current regimen.  BPH (benign prostatic  hyperplasia) Cardura works quite well though he has excessive lightheadedness and it affects his blood pressure.  We will get him referred to urology for further evaluation.  Gout Stable on allopurinol.   Orders Placed This Encounter  Procedures  . Ambulatory referral to Urology    Referral Priority:   Routine    Referral Type:   Consultation    Referral Reason:   Specialty Services Required    Requested Specialty:   Urology    Number of Visits Requested:    1    No orders of the defined types were placed in this encounter.    [name], MD [place]

## 2017-05-26 ENCOUNTER — Ambulatory Visit: Payer: Medicare Other | Admitting: Neurology

## 2017-06-08 ENCOUNTER — Ambulatory Visit (INDEPENDENT_AMBULATORY_CARE_PROVIDER_SITE_OTHER): Payer: Medicare Other | Admitting: Pharmacist

## 2017-06-08 ENCOUNTER — Encounter: Payer: Self-pay | Admitting: Pharmacist

## 2017-06-08 VITALS — BP 150/92 | HR 77 | Ht 71.0 in | Wt 264.0 lb

## 2017-06-08 DIAGNOSIS — E1142 Type 2 diabetes mellitus with diabetic polyneuropathy: Secondary | ICD-10-CM

## 2017-06-08 MED ORDER — LIRAGLUTIDE 18 MG/3ML ~~LOC~~ SOPN: PEN_INJECTOR | SUBCUTANEOUS | 0 refills | Status: DC

## 2017-06-08 MED ORDER — INSULIN GLARGINE 100 UNIT/ML SOLOSTAR PEN: PEN_INJECTOR | SUBCUTANEOUS | 11 refills | Status: DC

## 2017-06-08 NOTE — Assessment & Plan Note (Signed)
#  Diabetes longstanding diagnosed currently uncontrolled with elevations in fasting numbers most distinctly. Patient denies hypoglycemic events and is able to verbalize appropriate hypoglycemia management plan. Patient reports adherence with medication. Control is suboptimal due to potential for dawn effect, under-covered evening meals, dietary indiscretion, sedentary lifestyle. - Start Victoza with 3 week titration to achieve 1.8 mg daily dose. Patient educated on purpose, proper use and potential adverse effects of n/v.  Following instruction patient verbalized understanding of treatment plan. Sample provided.  - Decrease Lantus to 50 units daily - seems to be no discernable difference in glycemic control between 45-54 units daily and expect continued weight loss with addition of victoza.  - Continue metformin XR 500 mg BID - Next A1C anticipated April 2019 ASCVD risk - primary prevention, patient age 30 years old within 50-37 year old with DM, baseline LDL 70-189, multiple ASCVD risk factors. Last LDL well controlled at 57 mg/dL on 08/18/2016 labs. -Continue simvastatin 40mg  at bedtime - Continue aspirin 81mg

## 2017-06-08 NOTE — Assessment & Plan Note (Signed)
  Hypertension longstanding diagnosed currently uncontrolled in office with variable control at home. Patient reports adherence with medication at losartan 25 mg daily.  - Continue losartan 25mg  daily, doxazosin 8mg  daily - Counseled on continue blood pressure readings 2 hour after medication  - Dietary counseling and exercise counseling provided

## 2017-06-08 NOTE — Progress Notes (Signed)
S:     Chief Complaint  Patient presents with  . Medication Management    Diabetes    Patient arrives ambulatory and in good spirits.  Presents for diabetes evaluation, education, and management  (referred by Dr. [name] on 08/19/2016). Last seen by primary care provider on 05/22/17. Last Rx Clinic visit on 05/04/2017 - at that time insulin was titrated.   Today patient presents with list of glucose and BP readings as usual. Has titrated Lantus up to 54 units daily. Reports continued high level of stress from family health issues. He states he has received lyrica shipments from [employer]. Approved through 04/13/2018. Denies s/sx hypoglycemia. Willing to add additional injection daily. Encouraged by recent weight loss, desires to continue. Admits to frequent snacking, 6 times daily on average in addition to 2-3 meals. Denies candybars or slushies regularly, ~1/month. Denies falls/syncope/dizziness, chest pain. Reports he went to nephrologist recently but denies addition of medication. Review of chart after visit shows he was prescribed calcitriol 0.25 mg daily. Denies personal or family history of medullary thyroid carcinoma or MEN2 cancer.   Patient reports adherence with medications.  Current diabetes medications include: metformin XR 500 mg BID, lantus 54 units daily (titrating) Current hypertension medications include: doxazosin 8 mg daily, furosemide 40 mg daily, losartan 25 mg daily  Patient denies hypoglycemic events.  Patient reported dietary habits: Eats 2-3 meals/day + 6 snacks a day Breakfast:cereal or nothing Lunch:sandwich alone Dinner:varies, sometimes skips Snacks: black berry cobbler, cheese, buttered bread, peanut butter crackers Drinks:water  O:  Physical Exam  Constitutional: He appears well-developed and well-nourished.     Review of Systems  All other systems reviewed and are negative.    Lab Results  Component Value Date   HGBA1C 7.8 05/04/2017   Vitals:     06/08/17 1142  BP: (!) 150/92  Pulse: 77    Lipid Panel     Component Value Date/Time   CHOL 132 08/18/2016 1345   TRIG 143.0 08/18/2016 1345   HDL 46.70 08/18/2016 1345   CHOLHDL 3 08/18/2016 1345   VLDL 28.6 08/18/2016 1345   LDLCALC 57 08/18/2016 1345    Home fasting CBG: 120s-150s, excursions to 102 160s, 170s 2 hour post-prandial/random CBG: <180 120s-140s/70s-80s, excursions to SBP 150s, DBP 50s occasionally.     A/P: #Diabetes longstanding diagnosed currently uncontrolled with elevations in fasting numbers most distinctly. Patient denies hypoglycemic events and is able to verbalize appropriate hypoglycemia management plan. Patient reports adherence with medication. Control is suboptimal due to potential for dawn effect, under-covered evening meals, dietary indiscretion, sedentary lifestyle. - Start Victoza with 3 week titration to achieve 1.8 mg daily dose. Patient educated on purpose, proper use and potential adverse effects of n/v.  Following instruction patient verbalized understanding of treatment plan. Sample provided.  - Decrease Lantus to 50 units daily - seems to be no discernable difference in glycemic control between 45-54 units daily and expect continued weight loss with addition of victoza.  - Continue metformin XR 500 mg BID - Next A1C anticipated April 2019 ASCVD risk - primary prevention, patient age 72 years old within 50-21 year old with DM, baseline LDL 70-189, multiple ASCVD risk factors. Last LDL well controlled at 57 mg/dL on 08/18/2016 labs. -Continue simvastatin 40mg  at bedtime - Continue aspirin 81mg    Hypertension longstanding diagnosed currently uncontrolled in office with variable control at home. Patient reports adherence with medication at losartan 25 mg daily.  - Continue losartan 25mg  daily, doxazosin 8mg  daily -  Counseled on continue blood pressure readings 2 hour after medication  - Dietary counseling and exercise counseling  provided  #Medication Assistance - Patient previously obtaining medication assistance for Lyrica, [name]. Lyrica application approved via [place] through 04/13/2018. Patient confirms receipt of shipments.  -Will follow up as needed.   Written patient instructions provided.  Total time in face to face counseling 55 minutes.    Follow up in Pharmacist Clinic Visit in 4 weeks.     Carlean Jews, Pharm.D., BCPS, CPP PGY2 Ambulatory Care Pharmacy Resident Phone: [phone]

## 2017-06-08 NOTE — Patient Instructions (Signed)
Thanks for coming to see me. You are working hard! Stress plays a big role in blood sugars, as does frequent snacking and high-carbohydrate meals.   1. Stay at Lantus 50 units once a day.   2. Start Victoza :  Week 1: 0.6 mg once a day Week 2: 1.2 mg once a day Week 3: 1.8 mg once a day  CALL ME if you experience low blood sugars, nausea or vomiting.   Come back to see me in ~1  Month and we will see how your blood sugars are doing. Take blood sugars 1-2 times a day.

## 2017-06-08 NOTE — Progress Notes (Signed)
  I have reviewed the above information and agree with above.   Zabdiel Dripps, MD 

## 2017-06-12 ENCOUNTER — Encounter: Payer: Self-pay | Admitting: Urology

## 2017-06-12 ENCOUNTER — Ambulatory Visit (INDEPENDENT_AMBULATORY_CARE_PROVIDER_SITE_OTHER): Payer: Medicare Other | Admitting: Urology

## 2017-06-12 VITALS — BP 146/86 | HR 89 | Resp 16 | Ht 71.0 in | Wt 265.0 lb

## 2017-06-12 DIAGNOSIS — N401 Enlarged prostate with lower urinary tract symptoms: Secondary | ICD-10-CM

## 2017-06-12 DIAGNOSIS — R3911 Hesitancy of micturition: Secondary | ICD-10-CM

## 2017-06-12 LAB — MICROSCOPIC EXAMINATION
Bacteria, UA: NONE SEEN
Epithelial Cells (non renal): NONE SEEN /hpf (ref 0–10)
RBC, UA: NONE SEEN /hpf (ref 0–?)
WBC, UA: NONE SEEN /hpf (ref 0–?)

## 2017-06-12 LAB — URINALYSIS, COMPLETE
Bilirubin, UA: NEGATIVE
Glucose, UA: NEGATIVE
Ketones, UA: NEGATIVE
Leukocytes, UA: NEGATIVE
Nitrite, UA: NEGATIVE
Protein, UA: NEGATIVE
RBC, UA: NEGATIVE
Specific Gravity, UA: 1.01 (ref 1.005–1.030)
Urobilinogen, Ur: 0.2 mg/dL (ref 0.2–1.0)
pH, UA: 6.5 (ref 5.0–7.5)

## 2017-06-12 LAB — BLADDER SCAN AMB NON-IMAGING: Scan Result: 0

## 2017-06-12 MED ORDER — FINASTERIDE 5 MG PO TABS: 5.0000 mg | ORAL_TABLET | Freq: Every day | ORAL | 11 refills | Status: DC

## 2017-06-12 MED ORDER — SILODOSIN 8 MG PO CAPS: 8.0000 mg | ORAL_CAPSULE | Freq: Every day | ORAL | 11 refills | Status: DC

## 2017-06-12 NOTE — Progress Notes (Signed)
06/12/2017 2:31 PM   Douglas [name] 1945-07-03 [id number]  Referring provider: [name], MD [address] [place], [place] [address]  Chief Complaint  Patient presents with  . New Patient (Initial Visit)    BPH     HPI: The patient is a 72 year old gentleman with past medical history of BPH who presents to discuss his medications.  The symptoms have previously been well controlled on doxazosin however this gives him lightheadedness..  He has nocturia only x1.  While on this medication, he feels he empties his bladder he has a good stream.  He does not have daytime frequency.  He has mild urgency.  When he stops his medication, he does feel like he is unable to urinate and is very bothersome for him.  He however has been running a low part pressure on this medication and has felt like passing out.  Similar events happened with Flomax as well.  He has not tried other medications.   PMH: Past Medical History:  Diagnosis Date  . Arthritis   . Depression   . Diabetes mellitus without complication ([place])   . Edema    feet/legs  . GERD (gastroesophageal reflux disease)   . Gout   . Hypertension   . Hypothyroidism   . Kidney cysts    renal failure 2013  . Kidney stones    Dr. Rogers Blocker  . Neuropathy    diabetes  . OSA (obstructive sleep apnea)    supplemental oxygen at night  . Oxygen dependent    hs  . S/P cardiac cath 1998   [facility]  . Syncope and collapse   . Temporary low platelet count ([place])     Surgical History: Past Surgical History:  Procedure Laterality Date  . ANAL FISSURE REPAIR    . BACK SURGERY  1977   rupture disc lumbar spine  . CARDIAC CATHETERIZATION    . CATARACT EXTRACTION W/PHACO Right 07/16/2016   Procedure: CATARACT EXTRACTION PHACO AND INTRAOCULAR LENS PLACEMENT (IOC);  Surgeon: [name], MD;  Location: [facility] ORS;  Service: Ophthalmology;  Laterality: Right;  [place] 01:11 AP% 17.6 CDE 24.83 fluid pack lot # [id number]  .  CATARACT EXTRACTION W/PHACO Left 08/13/2016   Procedure: CATARACT EXTRACTION PHACO AND INTRAOCULAR LENS PLACEMENT ([place]) suture placed in left eye at end of procedure;  Surgeon: [name], MD;  Location: [facility] ORS;  Service: Ophthalmology;  Laterality: Left;  [place] 01:55 AP% 22.7 CDE 53.56 fluid pack lot # [id number]  . FINGER AMPUTATION     partial  . KNEE SURGERY  1993   arthroscopy  . SHOULDER SURGERY Bilateral 1998   arthroscopic right, rotator cuff repair left    Home Medications:  Allergies as of 06/12/2017      Reactions   Bee Venom Swelling   Dye Fdc Red [red Dye] Swelling   Atenolol Other (See Comments)   Did not regulate blood pressure   Benadryl [diphenhydramine Hcl (sleep)] Other (See Comments)   Hyperactivity   Enalapril Itching   itching   Gabapentin Palpitations, Rash   rash   Hctz [hydrochlorothiazide] Other (See Comments)   Decreased potassium      Medication List        Accurate as of 06/12/17  2:31 PM. Always use your most recent med list.          allopurinol 300 MG tablet Commonly known as:  ZYLOPRIM TAKE 1 TABLET BY MOUTH EVERY DAY   ALPRAZolam 0.5 MG tablet Commonly  known as:  XANAX Take 0.5 tablets (0.25 mg total) at bedtime as needed by mouth for anxiety.   aspirin 81 MG tablet Take 325 mg by mouth daily.   colchicine 0.6 MG tablet TAKE ONE TABLET BY MOUTH EVERY DAY FOR GOUT   doxazosin 8 MG tablet Commonly known as:  CARDURA TAKE 1 TABLET(8 MG) BY MOUTH AT BEDTIME   DULoxetine 60 MG capsule Commonly known as:  CYMBALTA Take 1 capsule (60 mg total) by mouth daily.   esomeprazole 40 MG capsule Commonly known as:  NEXIUM Take 1 capsule (40 mg total) by mouth daily at 12 noon.   finasteride 5 MG tablet Commonly known as:  PROSCAR Take 1 tablet (5 mg total) by mouth daily.   FREESTYLE FREEDOM LITE w/Device Kit Use as directed   furosemide 40 MG tablet Commonly known as:  LASIX Take 1 tablet (40 mg total) by mouth daily.     glucose blood test strip Commonly known as:  FREESTYLE LITE Use as directed to check blood sugar twice daily.  Dx: E11.29   Insulin Glargine 100 UNIT/ML Solostar Pen Commonly known as:  LANTUS Take 50 units once a day.   l-methylfolate-B6-B12 3-35-2 MG Tabs tablet Commonly known as:  METANX Take 1 tablet by mouth 2 (two) times daily.   levothyroxine 100 MCG tablet Commonly known as:  SYNTHROID, LEVOTHROID TAKE 1 TABLET BY MOUTH EVERY DAY EXCEPT MONDAY AND WEDNESDAY, TAKE 2 TABLETS ON MONDAY AND WEDNESDAY   liraglutide 18 MG/3ML Sopn Commonly known as:  VICTOZA 0.6 mg subcutaneously once a day x1 week, then 1.2 mg daily x1 week, then 1.8 mg daily thereafter   loratadine 10 MG tablet Commonly known as:  CLARITIN Take 10 mg by mouth daily as needed.   losartan 25 MG tablet Commonly known as:  COZAAR Take 1 tablet (25 mg total) by mouth daily.   LYRICA 100 MG capsule Generic drug:  pregabalin Take 1 capsule (100 mg total) by mouth 3 (three) times daily.   Magnesium 500 MG Tabs Take 1 tablet by mouth daily.   metFORMIN 500 MG 24 hr tablet Commonly known as:  GLUCOPHAGE-XR Take 1 tablet (500 mg total) by mouth 2 (two) times daily.   Pen Needles 32G X 5 MM Misc 30 pens by Does not apply route daily.   silodosin 8 MG Caps capsule Commonly known as:  RAPAFLO Take 1 capsule (8 mg total) by mouth at bedtime.   simvastatin 40 MG tablet Commonly known as:  ZOCOR Take 1 tablet (40 mg total) by mouth at bedtime.   SM VITAMIN D3 2000 units Caps Generic drug:  Cholecalciferol Take 8,000 Units by mouth daily.   vitamin B-12 250 MCG tablet Commonly known as:  CYANOCOBALAMIN Take 250 mcg by mouth daily.       Allergies:  Allergies  Allergen Reactions  . Bee Venom Swelling  . Dye Fdc Red [Red Dye] Swelling  . Atenolol Other (See Comments)    Did not regulate blood pressure  . Benadryl [Diphenhydramine Hcl (Sleep)] Other (See Comments)    Hyperactivity   . Enalapril  Itching    itching  . Gabapentin Palpitations and Rash    rash  . Hctz [Hydrochlorothiazide] Other (See Comments)    Decreased potassium    Family History: Family History  Problem Relation Age of Onset  . Breast cancer Mother   . Lung cancer Mother   . Bone cancer Mother   . Heart Problems Mother   .  Cancer Mother        breast, lung and rib  . AAA (abdominal aortic aneurysm) Mother   . Heart disease Mother   . Heart attack Father   . Heart disease Father   . Heart attack Brother   . Cancer Brother        esophageal  . Colon cancer Neg Hx     Social History:  reports that  has never smoked. he has never used smokeless tobacco. He reports that he does not drink alcohol or use drugs.  ROS: UROLOGY Frequent Urination?: Yes Hard to postpone urination?: Yes Burning/pain with urination?: No Get up at night to urinate?: Yes Leakage of urine?: No Urine stream starts and stops?: No Trouble starting stream?: No Do you have to strain to urinate?: No Blood in urine?: No Urinary tract infection?: No Sexually transmitted disease?: No Injury to kidneys or bladder?: Yes Painful intercourse?: No Weak stream?: No Erection problems?: Yes Penile pain?: No  Gastrointestinal Nausea?: No Vomiting?: No Indigestion/heartburn?: No Diarrhea?: No Constipation?: No  Constitutional Fever: No Night sweats?: No Weight loss?: No Fatigue?: Yes  Skin Skin rash/lesions?: No Itching?: No  Eyes Blurred vision?: No Double vision?: No  Ears/Nose/Throat Sore throat?: No Sinus problems?: No  Hematologic/Lymphatic Swollen glands?: No Easy bruising?: No  Cardiovascular Leg swelling?: Yes Chest pain?: No  Respiratory Cough?: No Shortness of breath?: No  Endocrine Excessive thirst?: Yes  Musculoskeletal Back pain?: Yes Joint pain?: Yes  Neurological Headaches?: No Dizziness?: Yes  Psychologic Depression?: Yes Anxiety?: Yes  Physical Exam: BP (!) 146/86   Pulse  89   Resp 16   Ht 5' 11"  (1.803 m)   Wt 265 lb (120.2 kg)   SpO2 96%   BMI 36.96 kg/m   Constitutional:  Alert and oriented, No acute distress. HEENT: [place] AT, moist mucus membranes.  Trachea midline, no masses. Cardiovascular: No clubbing, cyanosis, or edema. Respiratory: Normal respiratory effort, no increased work of breathing. GI: Abdomen is soft, nontender, nondistended, no abdominal masses GU: No CVA tenderness.  Normal uncircumcised phallus.  Testicles descended bilaterally benign.  DRE: 2+ smooth Skin: No rashes, bruises or suspicious lesions. Lymph: No cervical or inguinal adenopathy. Neurologic: Grossly intact, no focal deficits, moving all 4 extremities. Psychiatric: Normal mood and affect.  Laboratory Data: Lab Results  Component Value Date   WBC 7.5 02/18/2017   HGB 13.0 02/18/2017   HCT 40.3 02/18/2017   MCV 87.9 02/18/2017   PLT 225.0 02/18/2017    Lab Results  Component Value Date   CREATININE 1.40 02/18/2017    Lab Results  Component Value Date   PSA 0.81 01/27/2015    No results found for: TESTOSTERONE  Lab Results  Component Value Date   HGBA1C 7.8 05/04/2017    Urinalysis    Component Value Date/Time   COLORURINE Amber 03/23/2012 2320   APPEARANCEUR Hazy 03/23/2012 2320   LABSPEC 1.029 03/23/2012 2320   PHURINE 6.0 03/23/2012 2320   GLUCOSEU 50 mg/dL 03/23/2012 2320   HGBUR Negative 03/23/2012 2320   BILIRUBINUR Negative 03/23/2012 2320   KETONESUR Trace 03/23/2012 2320   PROTEINUR Negative 03/23/2012 2320   NITRITE Negative 03/23/2012 2320   LEUKOCYTESUR Trace 03/23/2012 2320     Assessment & Plan:    1.  BPH We will have the patient stop his doxazosin to see if that helps with his lightheadedness and low blood pressure.  We will try him on Rapaflo.  He was warned that this medication works similarly and  does also present the risk for orthostatic hypotension.  If this medication is cost prohibitive, we could try terazosin.  If he  develops any lightheadedness in either of these medications I would feel he is not a candidate for any other medications in this class.  We will also start him on finasteride to help shrink his prostate in the event that he continues to have dizziness.  He was warned that this medication can take up to 6 months to have its maximal effect.  We will see him back in a few months.  Return in about 3 months (around 09/12/2017).  Nickie Retort, MD  [facility] [address], [place], [place] [address] [phone]

## 2017-06-12 NOTE — Addendum Note (Signed)
Addended by: Valere Dross on: 06/12/2017 02:33 PM   Modules accepted: Orders

## 2017-06-18 ENCOUNTER — Other Ambulatory Visit: Payer: Self-pay | Admitting: Family Medicine

## 2017-06-19 NOTE — Telephone Encounter (Signed)
Last OV 05/22/17 last filled 02/18/17 30 1rf

## 2017-06-19 NOTE — Telephone Encounter (Signed)
Sent to pharmacy 

## 2017-06-21 ENCOUNTER — Other Ambulatory Visit: Payer: Self-pay | Admitting: Family Medicine

## 2017-06-25 ENCOUNTER — Encounter: Payer: Self-pay | Admitting: Cardiovascular Disease

## 2017-06-25 ENCOUNTER — Ambulatory Visit (INDEPENDENT_AMBULATORY_CARE_PROVIDER_SITE_OTHER): Payer: Medicare Other | Admitting: Cardiovascular Disease

## 2017-06-25 VITALS — BP 162/97 | HR 81 | Ht 71.0 in | Wt 266.8 lb

## 2017-06-25 DIAGNOSIS — R55 Syncope and collapse: Secondary | ICD-10-CM

## 2017-06-25 DIAGNOSIS — R0602 Shortness of breath: Secondary | ICD-10-CM | POA: Diagnosis not present

## 2017-06-25 DIAGNOSIS — E785 Hyperlipidemia, unspecified: Secondary | ICD-10-CM | POA: Diagnosis not present

## 2017-06-25 DIAGNOSIS — I10 Essential (primary) hypertension: Secondary | ICD-10-CM

## 2017-06-25 MED ORDER — LOSARTAN POTASSIUM 25 MG PO TABS: 50.0000 mg | ORAL_TABLET | Freq: Every day | ORAL | 1 refills | Status: DC

## 2017-06-25 MED ORDER — DOXAZOSIN MESYLATE 8 MG PO TABS: 4.0000 mg | ORAL_TABLET | Freq: Every day | ORAL | 0 refills | Status: DC

## 2017-06-25 NOTE — Progress Notes (Signed)
Cardiology Office Note   Date:  06/25/2017   ID:  Douglas [name], DOB 12/04/1945, MRN [id number]  PCP:  [name], MD  Cardiologist:   Kathlyn Sacramento, MD   Chief Complaint  Patient presents with  . other    Ref by Dr. [name] for syncope. Pt. has been followed by [facility] in the past. Meds reviewed by the pt. verbally.  Pt. c/o syncope for many years with having 2 spells of syncope in the past two weeks. Pt. c/o being dizzy today.       History of Present Illness: Douglas [name] is a 72 y.o. male who was referred by Dr. [name] and Dr. Holley Raring for evaluation of dizziness and syncope.  The patient reports history of abnormal stress test in 1998 which was followed by cardiac catheterization that showed no obstructive disease.  It was done by Dr. Rex Kras.  He has multiple chronic medical conditions that include hyperlipidemia, essential hypertension that started in his early 66s, type 2 diabetes of at least 10 years duration, chronic kidney disease with prior acute renal failure, obesity and BPH. He reports prolonged episodes of orthostatic dizziness with occasional syncope.  This usually happens when his blood pressure is low.  He has been taking doxazosin for many years to help with his enlarged prostate.  He recently saw urology with plans to switch him to something else.  He denies any palpitations.  No chest pain but he does complain of significant exertional dyspnea.    Past Medical History:  Diagnosis Date  . Arthritis   . Depression   . Diabetes mellitus without complication ([place])   . Edema    feet/legs  . GERD (gastroesophageal reflux disease)   . Gout   . Hypertension   . Hypothyroidism   . Kidney cysts    renal failure 2013  . Kidney stones    Dr. Rogers Blocker  . Neuropathy    diabetes  . OSA (obstructive sleep apnea)    supplemental oxygen at night  . Oxygen dependent    hs  . S/P cardiac cath 1998   [facility]  . Syncope and collapse   .  Temporary low platelet count (HCC)     Past Surgical History:  Procedure Laterality Date  . ANAL FISSURE REPAIR    . BACK SURGERY  1977   rupture disc lumbar spine  . [facility] with [facility] [address] [facility].   Marland Kitchen CATARACT EXTRACTION W/PHACO Right 07/16/2016   Procedure: CATARACT EXTRACTION PHACO AND INTRAOCULAR LENS PLACEMENT (IOC);  Surgeon: [name], MD;  Location: [facility] ORS;  Service: Ophthalmology;  Laterality: Right;  [place] 01:11 AP% 17.6 CDE 24.83 fluid pack lot # [id number]  . CATARACT EXTRACTION W/PHACO Left 08/13/2016   Procedure: CATARACT EXTRACTION PHACO AND INTRAOCULAR LENS PLACEMENT ([place]) suture placed in left eye at end of procedure;  Surgeon: [name], MD;  Location: [facility] ORS;  Service: Ophthalmology;  Laterality: Left;  [place] 01:55 AP% 22.7 CDE 53.56 fluid pack lot # [id number]  . FINGER AMPUTATION     partial  . KNEE SURGERY  1993   arthroscopy  . SHOULDER SURGERY Bilateral 1998   arthroscopic right, rotator cuff repair left     Current Outpatient Medications  Medication Sig Dispense Refill  . allopurinol (ZYLOPRIM) 300 MG tablet TAKE 1 TABLET BY MOUTH EVERY DAY 90 tablet 0  . ALPRAZolam (XANAX) 0.5 MG tablet TAKE 1/2 TABLET BY MOUTH AT BEDTIME  AS NEEDED FOR ANXIETY 30 tablet 1  . aspirin 325 MG tablet Take 325 mg by mouth daily.    . Blood Glucose Monitoring Suppl (FREESTYLE FREEDOM LITE) W/DEVICE KIT Use as directed 1 each 0  . Cholecalciferol (SM VITAMIN D3) 2000 units CAPS Take 8,000 Units by mouth daily.    Marland Kitchen doxazosin (CARDURA) 8 MG tablet TAKE 1 TABLET(8 MG) BY MOUTH AT BEDTIME 90 tablet 0  . DULoxetine (CYMBALTA) 60 MG capsule TAKE 1 CAPSULE(60 MG) BY MOUTH DAILY 90 capsule 0  . esomeprazole (NEXIUM) 40 MG capsule Take 1 capsule (40 mg total) by mouth daily at 12 noon. 90 capsule 3  . finasteride (PROSCAR) 5 MG tablet Take 1 tablet (5 mg total) by mouth daily. 30 tablet 11  . furosemide (LASIX) 40 MG tablet  Take 1 tablet (40 mg total) by mouth daily. 90 tablet 3  . glucose blood (FREESTYLE LITE) test strip Use as directed to check blood sugar twice daily.  Dx: E11.29 200 each 3  . HYDROcodone-acetaminophen (NORCO/VICODIN) 5-325 MG tablet Take 1 tablet by mouth every 6 (six) hours as needed for moderate pain.    . Insulin Glargine (LANTUS) 100 UNIT/ML Solostar Pen Take 50 units once a day. 15 mL 11  . Insulin Pen Needle (PEN NEEDLES) 32G X 5 MM MISC 30 pens by Does not apply route daily. 50 each 11  . l-methylfolate-B6-B12 (METANX) 3-35-2 MG TABS tablet Take 1 tablet by mouth 2 (two) times daily. (Patient taking differently: Take 1 tablet by mouth daily. ) 180 tablet 3  . levothyroxine (SYNTHROID, LEVOTHROID) 100 MCG tablet TAKE 1 TABLET BY MOUTH EVERY DAY EXCEPT MONDAY AND WEDNESDAY, TAKE 2 TABLETS ON MONDAY AND WEDNESDAY 90 tablet 3  . liraglutide (VICTOZA) 18 MG/3ML SOPN 0.6 mg subcutaneously once a day x1 week, then 1.2 mg daily x1 week, then 1.8 mg daily thereafter 1 pen 0  . loratadine (CLARITIN) 10 MG tablet Take 10 mg by mouth daily as needed.     Marland Kitchen losartan (COZAAR) 25 MG tablet Take 1 tablet (25 mg total) by mouth daily. 90 tablet 1  . LYRICA 100 MG capsule Take 1 capsule (100 mg total) by mouth 3 (three) times daily. 270 capsule 3  . Magnesium 500 MG TABS Take 0.5 tablets by mouth daily.     . metFORMIN (GLUCOPHAGE-XR) 500 MG 24 hr tablet Take 1 tablet (500 mg total) by mouth 2 (two) times daily. 180 tablet 3  . simvastatin (ZOCOR) 40 MG tablet Take 1 tablet (40 mg total) by mouth at bedtime. 90 tablet 3  . vitamin B-12 (CYANOCOBALAMIN) 250 MCG tablet Take 250 mcg by mouth daily.      No current facility-administered medications for this visit.     Allergies:   Bee venom; Dye fdc red [red dye]; Atenolol; Benadryl [diphenhydramine hcl (sleep)]; Enalapril; Gabapentin; and Hctz [hydrochlorothiazide]    Social History:  The patient  reports that  has never smoked. he has never used smokeless  tobacco. He reports that he does not drink alcohol or use drugs.   Family History:  The patient's family history includes AAA (abdominal aortic aneurysm) in his mother; Bone cancer in his mother; Breast cancer in his mother; Cancer in his brother and mother; Heart Problems in his mother; Heart attack in his brother and father; Heart disease in his father and mother; Lung cancer in his mother.    ROS:  Please see the history of present illness.   Otherwise, review of  systems are positive for none.   All other systems are reviewed and negative.    PHYSICAL EXAM: VS:  BP (!) 162/97 (BP Location: Left Arm, Patient Position: Sitting, Cuff Size: Large)   Pulse 81   Ht 5' 11"  (1.803 m)   Wt 266 lb 12 oz (121 kg)   BMI 37.20 kg/m  , BMI Body mass index is 37.2 kg/m. GEN: Well nourished, well developed, in no acute distress  HEENT: normal  Neck: no JVD, carotid bruits, or masses Cardiac: RRR; no  rubs, or gallops, mild bilateral edema .  2 out of 6 systolic murmur in the aortic area Respiratory:  clear to auscultation bilaterally, normal work of breathing GI: soft, nontender, nondistended, + BS MS: no deformity or atrophy  Skin: warm and dry, no rash Neuro:  Strength and sensation are intact Psych: euthymic mood, full affect   EKG:  EKG is ordered today. The ekg ordered today demonstrates normal sinus rhythm with no significant ST or T wave changes.   Recent Labs: 08/18/2016: TSH 3.63 02/18/2017: ALT 36; BUN 14; Creatinine, Ser 1.40; Hemoglobin 13.0; Platelets 225.0; Potassium 4.1; Sodium 140    Lipid Panel    Component Value Date/Time   CHOL 132 08/18/2016 1345   TRIG 143.0 08/18/2016 1345   HDL 46.70 08/18/2016 1345   CHOLHDL 3 08/18/2016 1345   VLDL 28.6 08/18/2016 1345   LDLCALC 57 08/18/2016 1345      Wt Readings from Last 3 Encounters:  06/25/17 266 lb 12 oz (121 kg)  06/12/17 265 lb (120.2 kg)  06/08/17 264 lb (119.7 kg)      Other studies Reviewed: Additional  studies/ records that were reviewed today include: Records from Dr. Holley Raring. Review of the above records demonstrates: Summarized above  PAD Screen 06/25/2017  Previous PAD dx? No  Previous surgical procedure? No  Pain with walking? No  Feet/toe relief with dangling? No  Painful, non-healing ulcers? No  Extremities discolored? No      ASSESSMENT AND PLAN:  1.  Orthostatic dizziness and syncope: The patient had this issue for many years.  The history is not suggestive of arrhythmia.  I suspect some of his medications especially doxazosin is contributing.  Thus, I think it is worth to wean him off this medication and find an alternative for BPH. I elected to decrease doxazosin to 4 mg daily and increase losartan to 50 mg once daily.  2.  Significant exertional dyspnea: Multiple risk factors for coronary artery disease.  Thus, I recommend evaluation with a pharmacologic nuclear stress test.  He is not able to exercise on a treadmill.  I also requested an echocardiogram given that he has a cardiac murmur.  3.  Essential hypertension: Blood pressure is not well controlled and he has symptoms of orthostatic dizziness.  I made changes as outlined above.  I might consider adding carvedilol or amlodipine once we take him off doxazosin.  4.  Hyperlipidemia: Currently on simvastatin.    Disposition:   FU with me in 1 month  Signed,  Kathlyn Sacramento, MD  06/25/2017 2:18 PM    [facility]

## 2017-06-25 NOTE — Patient Instructions (Addendum)
Medication Instructions:  Your physician has recommended you make the following change in your medication:  1- DECREASE Doxazosin to 4 mg  By mouth once a day. 2- INCREASE Losartan to 50 mg by mouth once a day.   Labwork: none  Testing/Procedures: Your physician has requested that you have an echocardiogram. Echocardiography is a painless test that uses sound waves to create images of your heart. It provides your doctor with information about the size and shape of your heart and how well your heart's chambers and valves are working. This procedure takes approximately one hour. There are no restrictions for this procedure.   Your physician has requested that you have a lexiscan myoview. For further information please visit [email or web address]. Please follow instruction sheet, as given.  [place]  Your caregiver has ordered a Stress Test with nuclear imaging. The purpose of this test is to evaluate the blood supply to your heart muscle. This procedure is referred to as a "Non-Invasive Stress Test." This is because other than having an IV started in your vein, nothing is inserted or "invades" your body. Cardiac stress tests are done to find areas of poor blood flow to the heart by determining the extent of coronary artery disease (CAD). Some patients exercise on a treadmill, which naturally increases the blood flow to your heart, while others who are  unable to walk on a treadmill due to physical limitations have a pharmacologic/chemical stress agent called Lexiscan . This medicine will mimic walking on a treadmill by temporarily increasing your coronary blood flow.   Please note: these test may take anywhere between 2-4 hours to complete  PLEASE REPORT TO [place] AT THE FIRST DESK WILL DIRECT YOU WHERE TO GO  Date of Procedure:_____________________________________  Arrival Time for Procedure:______________________________  Instructions regarding  medication:   _XX_ : Hold diabetes medication morning of procedure (METFORMIN, INSULIN)  _XX_:  Hold other medications as follows:________FUROSEMIDE_________  PLEASE NOTIFY THE OFFICE AT LEAST 24 HOURS IN ADVANCE IF YOU ARE UNABLE TO KEEP YOUR APPOINTMENT.  [phone] AND  PLEASE NOTIFY NUCLEAR MEDICINE AT [facility] AT LEAST 24 HOURS IN ADVANCE IF YOU ARE UNABLE TO KEEP YOUR APPOINTMENT. [phone]  How to prepare for your Myoview test:  1. Do not eat or drink after midnight 2. No caffeine for 24 hours prior to test 3. No smoking 24 hours prior to test. 4. Your medication may be taken with water.  If your doctor stopped a medication because of this test, do not take that medication. 5. Ladies, please do not wear dresses.  Skirts or pants are appropriate. Please wear a short sleeve shirt. 6. No perfume, cologne or lotion. 7. Wear comfortable walking shoes. No heels!       Follow-Up: Your physician recommends that you schedule a follow-up appointment in: [place].  If you need a refill on your cardiac medications before your next appointment, please call your pharmacy.   Cardiac Nuclear Scan A cardiac nuclear scan is a test that measures blood flow to the heart when a person is resting and when he or she is exercising. The test looks for problems such as:  Not enough blood reaching a portion of the heart.  The heart muscle not working normally.  You may need this test if:  You have heart disease.  You have had abnormal lab results.  You have had heart surgery or angioplasty.  You have chest pain.  You have shortness of  breath.  In this test, a radioactive dye (tracer) is injected into your bloodstream. After the tracer has traveled to your heart, an imaging device is used to measure how much of the tracer is absorbed by or distributed to various areas of your heart. This procedure is usually done at a hospital and takes 2-4 hours. Tell a health care provider  about:  Any allergies you have.  All medicines you are taking, including vitamins, herbs, eye drops, creams, and over-the-counter medicines.  Any problems you or family members have had with the use of anesthetic medicines.  Any blood disorders you have.  Any surgeries you have had.  Any medical conditions you have.  Whether you are pregnant or may be pregnant. What are the risks? Generally, this is a safe procedure. However, problems may occur, including:  Serious chest pain and heart attack. This is only a risk if the stress portion of the test is done.  Rapid heartbeat.  Sensation of warmth in your chest. This usually passes quickly.  What happens before the procedure?  Ask your health care provider about changing or stopping your regular medicines. This is especially important if you are taking diabetes medicines or blood thinners.  Remove your jewelry on the day of the procedure. What happens during the procedure?  An IV tube will be inserted into one of your veins.  Your health care provider will inject a small amount of radioactive tracer through the tube.  You will wait for 20-40 minutes while the tracer travels through your bloodstream.  Your heart activity will be monitored with an electrocardiogram (ECG).  You will lie down on an exam table.  Images of your heart will be taken for about 15-20 minutes.  You may be asked to exercise on a treadmill or stationary bike. While you exercise, your heart's activity will be monitored with an ECG, and your blood pressure will be checked. If you are unable to exercise, you may be given a medicine to increase blood flow to parts of your heart.  When blood flow to your heart has peaked, a tracer will again be injected through the IV tube.  After 20-40 minutes, you will get back on the exam table and have more images taken of your heart.  When the procedure is over, your IV tube will be removed. The procedure may vary  among health care providers and hospitals. Depending on the type of tracer used, scans may need to be repeated 3-4 hours later. What happens after the procedure?  Unless your health care provider tells you otherwise, you may return to your normal schedule, including diet, activities, and medicines.  Unless your health care provider tells you otherwise, you may increase your fluid intake. This will help flush the contrast dye from your body. Drink enough fluid to keep your urine clear or pale yellow.  It is up to you to get your test results. Ask your health care provider, or the department that is doing the test, when your results will be ready. Summary  A cardiac nuclear scan measures the blood flow to the heart when a person is resting and when he or she is exercising.  You may need this test if you are at risk for heart disease.  Tell your health care provider if you are pregnant.  Unless your health care provider tells you otherwise, increase your fluid intake. This will help flush the contrast dye from your body. Drink enough fluid to keep your urine clear  or pale yellow. This information is not intended to replace advice given to you by your health care provider. Make sure you discuss any questions you have with your health care provider. Document Released: 04/25/2004 Document Revised: 04/02/2016 Document Reviewed: 03/09/2013 Elsevier Interactive Patient Education  2017 [employer].   Echocardiogram An echocardiogram, or echocardiography, uses sound waves (ultrasound) to produce an image of your heart. The echocardiogram is simple, painless, obtained within a short period of time, and offers valuable information to your health care provider. The images from an echocardiogram can provide information such as:  Evidence of coronary artery disease (CAD).  Heart size.  Heart muscle function.  Heart valve function.  Aneurysm detection.  Evidence of a past heart attack.  Fluid  buildup around the heart.  Heart muscle thickening.  Assess heart valve function.  Tell a health care provider about:  Any allergies you have.  All medicines you are taking, including vitamins, herbs, eye drops, creams, and over-the-counter medicines.  Any problems you or family members have had with anesthetic medicines.  Any blood disorders you have.  Any surgeries you have had.  Any medical conditions you have.  Whether you are pregnant or may be pregnant. What happens before the procedure? No special preparation is needed. Eat and drink normally. What happens during the procedure?  In order to produce an image of your heart, gel will be applied to your chest and a wand-like tool (transducer) will be moved over your chest. The gel will help transmit the sound waves from the transducer. The sound waves will harmlessly bounce off your heart to allow the heart images to be captured in real-time motion. These images will then be recorded.  You may need an IV to receive a medicine that improves the quality of the pictures. What happens after the procedure? You may return to your normal schedule including diet, activities, and medicines, unless your health care provider tells you otherwise. This information is not intended to replace advice given to you by your health care provider. Make sure you discuss any questions you have with your health care provider. Document Released: 03/28/2000 Document Revised: 11/17/2015 Document Reviewed: 12/06/2012 Elsevier Interactive Patient Education  2017 [employer].

## 2017-07-01 ENCOUNTER — Encounter
Admission: RE | Admit: 2017-07-01 | Discharge: 2017-07-01 | Disposition: A | Discharge status: Home / Self Care | Payer: Medicare Other | Source: Ambulatory Visit | Attending: Cardiovascular Disease | Admitting: Cardiovascular Disease

## 2017-07-01 DIAGNOSIS — R0602 Shortness of breath: Secondary | ICD-10-CM | POA: Insufficient documentation

## 2017-07-01 LAB — NM MYOCAR MULTI W/SPECT W/WALL MOTION / EF
LV dias vol: 131 mL (ref 62–150)
LV sys vol: 44 mL
Peak HR: 77 {beats}/min
Rest HR: 64 {beats}/min
TID: 1.08

## 2017-07-01 MED ORDER — TECHNETIUM TC 99M TETROFOSMIN IV KIT: 33.4130 | PACK | Freq: Once | INTRAVENOUS | Status: DC | PRN

## 2017-07-01 MED ORDER — TECHNETIUM TC 99M TETROFOSMIN IV KIT
13.0000 | PACK | Freq: Once | INTRAVENOUS | Status: AC | PRN
  Administered 2017-07-01: 13.646 via INTRAVENOUS

## 2017-07-01 MED ORDER — REGADENOSON 0.4 MG/5ML IV SOLN
0.4000 mg | Freq: Once | INTRAVENOUS | Status: AC
  Administered 2017-07-01: 0.4 mg via INTRAVENOUS

## 2017-07-02 ENCOUNTER — Telehealth: Payer: Self-pay

## 2017-07-02 ENCOUNTER — Ambulatory Visit (INDEPENDENT_AMBULATORY_CARE_PROVIDER_SITE_OTHER): Payer: Medicare Other | Admitting: Neurology

## 2017-07-02 ENCOUNTER — Encounter: Payer: Self-pay | Admitting: Neurology

## 2017-07-02 VITALS — BP 151/93 | HR 77 | Ht 71.0 in | Wt 266.6 lb

## 2017-07-02 DIAGNOSIS — E1142 Type 2 diabetes mellitus with diabetic polyneuropathy: Secondary | ICD-10-CM

## 2017-07-02 MED ORDER — L-METHYLFOLATE-B6-B12 3-35-2 MG PO TABS
1.0000 | ORAL_TABLET | Freq: Two times a day (BID) | ORAL | 4 refills | Status: DC
Start: 2017-07-02 — End: 2017-12-10

## 2017-07-02 NOTE — Telephone Encounter (Signed)
-----   Message from Vanessa Ralphs, RN sent at 07/02/2017 11:53 AM EDT ----- Regarding: Follow up appt Hi there,  Patient is to have a 1 month f/u with Dr Fletcher Anon after testing. I know sometimes patient's don't want to schedule at check out. Do you mind following up with patient to make f/u appointment?  Thanks, [employer]

## 2017-07-02 NOTE — Progress Notes (Signed)
[facility]  PATIENT: Douglas [name] DOB: Nov 03, 1945   HISTORY OF PRESENT ILLNESS:Douglas [name] 72 year old male returns for followup. He has a history of bilateral feet and hand paresthesias. He also has a history of diabetes since 2000, follow-up for diabetic peripheral neuropathy He has gout, obesity, obstructive sleep apnea but could not tolerate CPA, and diabetes for since 2000. He has no distal leg weakness, history of low back pain and occasional right lower extremity shooting pain. EMG nerve conduction showed evidence of length dependent mild axonal peripheral neuropathy. There is no evidence of right lumbosacral radiculopathy. Patient has had side effects to gabapentin in the past which cause swelling and rash. He did not find the cream to be beneficial.Laboratory evaluation in October 2013 showed normal TSH, B12, ESR, C-reactive protein, protein electrophoresis. He was taking Lyrica 131m 3 times a day, but complains of unbearable bilateral feet and fingertips paresthesia, I have added on Trileptal 150 mg twice a day since his previous visit in June 2015, he did not notice any significant benefit, He continued to be active, driving his grandchildren each day, also complains of chronic low back pain, previously had epidural injection, has been very helpful, Most recent A1c was 6.0.  UPDATE September 25 2015: Last visit was with [name] December 2016, he continue complains significant bilateral feet and fingertips paresthesia, He is currently on Cymbalta by his primary care, and Lyrica 100 mg 3 times a day, which has been helpful, he denies significant side effect from combination therapy, in addition he was started on Metanx he noticed definite benefit, most recent A1c 6.4.  He noticed worsening bilateral feet  plantar surface and toe pain after bearing weight, he also noticed bilateral hands paresthesia, he is hands involvement was before the feet in 2012, We have  reviewed laboratory evaluations, there was increased [place] chain in February 2017, he was evaluated by oncologist, was deemed due to his chronic renal disease,  UPDATE July 02 2017:  A1C 7.8 in Jan 2019, he is taking Lyrica 1039mtid through medical assistant program by his PCP Dr. SoElisabeth CaraCymbalta 6055mam, also Mentax one tab twice a day.  He also use over-the-counter lidocaine roller, which has been helpful,  REVIEW OF SYSTEMS: Full 14 system review of systems performed and notable only for those listed, all others are neg:  Joint pain,    ALLERGIES: Allergies  Allergen Reactions  . Bee Venom Swelling  . Dye Fdc Red [Red Dye] Swelling  . Atenolol Other (See Comments)    Did not regulate blood pressure  . Benadryl [Diphenhydramine Hcl (Sleep)] Other (See Comments)    Hyperactivity   . Enalapril Itching    itching  . Gabapentin Palpitations and Rash    rash  . Hctz [Hydrochlorothiazide] Other (See Comments)    Decreased potassium    HOME MEDICATIONS: Outpatient Medications Prior to Visit  Medication Sig Dispense Refill  . allopurinol (ZYLOPRIM) 300 MG tablet TAKE 1 TABLET BY MOUTH EVERY DAY 90 tablet 0  . ALPRAZolam (XANAX) 0.5 MG tablet TAKE 1/2 TABLET BY MOUTH AT BEDTIME AS NEEDED FOR ANXIETY 30 tablet 1  . aspirin 325 MG tablet Take 325 mg by mouth daily.    . Blood Glucose Monitoring Suppl (FREESTYLE FREEDOM LITE) W/DEVICE KIT Use as directed 1 each 0  . Cholecalciferol (SM VITAMIN D3) 2000 units CAPS Take 8,000 Units by mouth daily.    . dMarland Kitchenxazosin (CARDURA) 8 MG tablet Take 0.5 tablets (4 mg total)  by mouth daily. 90 tablet 0  . DULoxetine (CYMBALTA) 60 MG capsule TAKE 1 CAPSULE(60 MG) BY MOUTH DAILY 90 capsule 0  . esomeprazole (NEXIUM) 40 MG capsule Take 1 capsule (40 mg total) by mouth daily at 12 noon. 90 capsule 3  . finasteride (PROSCAR) 5 MG tablet Take 1 tablet (5 mg total) by mouth daily. 30 tablet 11  . furosemide (LASIX) 40 MG tablet Take 1 tablet  (40 mg total) by mouth daily. 90 tablet 3  . glucose blood (FREESTYLE LITE) test strip Use as directed to check blood sugar twice daily.  Dx: E11.29 200 each 3  . HYDROcodone-acetaminophen (NORCO/VICODIN) 5-325 MG tablet Take 1 tablet by mouth every 6 (six) hours as needed for moderate pain.    . Insulin Glargine (LANTUS) 100 UNIT/ML Solostar Pen Take 50 units once a day. 15 mL 11  . Insulin Pen Needle (PEN NEEDLES) 32G X 5 MM MISC 30 pens by Does not apply route daily. 50 each 11  . l-methylfolate-B6-B12 (METANX) 3-35-2 MG TABS tablet Take 1 tablet by mouth 2 (two) times daily. (Patient taking differently: Take 1 tablet by mouth daily. ) 180 tablet 3  . levothyroxine (SYNTHROID, LEVOTHROID) 100 MCG tablet TAKE 1 TABLET BY MOUTH EVERY DAY EXCEPT MONDAY AND WEDNESDAY, TAKE 2 TABLETS ON MONDAY AND WEDNESDAY 90 tablet 3  . liraglutide (VICTOZA) 18 MG/3ML SOPN 0.6 mg subcutaneously once a day x1 week, then 1.2 mg daily x1 week, then 1.8 mg daily thereafter 1 pen 0  . loratadine (CLARITIN) 10 MG tablet Take 10 mg by mouth daily as needed.     Marland Kitchen losartan (COZAAR) 25 MG tablet Take 2 tablets (50 mg total) by mouth daily. 90 tablet 1  . LYRICA 100 MG capsule Take 1 capsule (100 mg total) by mouth 3 (three) times daily. 270 capsule 3  . Magnesium 500 MG TABS Take 0.5 tablets by mouth daily.     . metFORMIN (GLUCOPHAGE-XR) 500 MG 24 hr tablet Take 1 tablet (500 mg total) by mouth 2 (two) times daily. 180 tablet 3  . simvastatin (ZOCOR) 40 MG tablet Take 1 tablet (40 mg total) by mouth at bedtime. 90 tablet 3  . vitamin B-12 (CYANOCOBALAMIN) 250 MCG tablet Take 250 mcg by mouth daily.      No facility-administered medications prior to visit.     PAST MEDICAL HISTORY: Past Medical History:  Diagnosis Date  . Arthritis   . Depression   . Diabetes mellitus without complication ([place])   . Edema    feet/legs  . GERD (gastroesophageal reflux disease)   . Gout   . Hypertension   . Hypothyroidism   .  Kidney cysts    renal failure 2013  . Kidney stones    Dr. Rogers Blocker  . Neuropathy    diabetes  . OSA (obstructive sleep apnea)    supplemental oxygen at night  . Oxygen dependent    hs  . S/P cardiac cath 1998   [facility]  . Syncope and collapse   . Temporary low platelet count ([place])     PAST SURGICAL HISTORY: Past Surgical History:  Procedure Laterality Date  . ANAL FISSURE REPAIR    . BACK SURGERY  1977   rupture disc lumbar spine  . [facility] with [facility].   Marland Kitchen CATARACT EXTRACTION W/PHACO Right 07/16/2016   Procedure: CATARACT EXTRACTION PHACO AND INTRAOCULAR LENS PLACEMENT (IOC);  Surgeon: [name], MD;  Location:  [facility] ORS;  Service: Ophthalmology;  Laterality: Right;  [place] 01:11 AP% 17.6 CDE 24.83 fluid pack lot # [id number]  . CATARACT EXTRACTION W/PHACO Left 08/13/2016   Procedure: CATARACT EXTRACTION PHACO AND INTRAOCULAR LENS PLACEMENT ([place]) suture placed in left eye at end of procedure;  Surgeon: [name], MD;  Location: [facility] ORS;  Service: Ophthalmology;  Laterality: Left;  [place] 01:55 AP% 22.7 CDE 53.56 fluid pack lot # [id number]  . FINGER AMPUTATION     partial  . KNEE SURGERY  1993   arthroscopy  . SHOULDER SURGERY Bilateral 1998   arthroscopic right, rotator cuff repair left    FAMILY HISTORY: Family History  Problem Relation Age of Onset  . Breast cancer Mother   . Lung cancer Mother   . Bone cancer Mother   . Heart Problems Mother   . Cancer Mother        breast, lung and rib  . AAA (abdominal aortic aneurysm) Mother   . Heart disease Mother   . Heart attack Father   . Heart disease Father   . Heart attack Brother   . Cancer Brother        esophageal  . Colon cancer Neg Hx     SOCIAL HISTORY: Social History   Socioeconomic History  . Marital status: Married    Spouse name: [name]  . Number of children: 3  . Years of education: 51  . Highest education level: Not on file    Occupational History    Comment: retired  Scientific laboratory technician  . Financial resource strain: Not on file  . Food insecurity:    Worry: Not on file    Inability: Not on file  . Transportation needs:    Medical: Not on file    Non-medical: Not on file  Tobacco Use  . Smoking status: Never Smoker  . Smokeless tobacco: Never Used  Substance and Sexual Activity  . Alcohol use: No  . Drug use: No  . Sexual activity: Yes  Lifestyle  . Physical activity:    Days per week: Not on file    Minutes per session: Not on file  . Stress: Not on file  Relationships  . Social connections:    Talks on phone: Not on file    Gets together: Not on file    Attends religious service: Not on file    Active member of club or organization: Not on file    Attends meetings of clubs or organizations: Not on file    Relationship status: Not on file  . Intimate partner violence:    Fear of current or ex partner: Not on file    Emotionally abused: Not on file    Physically abused: Not on file    Forced sexual activity: Not on file  Other Topics Concern  . Not on file  Social History Narrative   Lives in [place] with his wife ([name]). No children. Three step children. No pets.      Work - Patient is retired. Maintenance supervisor.      School - One year college education.      Right handed.      [place], served in [place], no combat                 PHYSICAL EXAM  Vitals:   07/02/17 1448 07/02/17 1456  BP: (!) 163/96 (!) 151/93  Pulse: 77 77  Weight: 266 lb 9.6 oz (120.9 kg)   Height: 5'  11" (1.803 m)    Body mass index is 37.18 kg/m. Generalized: Well developed, obese male in no acute distress  Head: normocephalic and atraumatic,. Oropharynx benign  Neck: Supple, no carotid bruits  Cardiac: Regular rate rhythm, no murmur    Neurological examination   Mentation: Alert oriented to time, place, history taking. Follows all commands speech and language  fluent  Cranial nerve II-XII: Pupils were equal round reactive to light extraocular movements were full, visual field were full on confrontational test. Facial sensation and strength were normal. hearing was intact to finger rubbing bilaterally. Uvula tongue midline. head turning and shoulder shrug and were normal and symmetric.Tongue protrusion into cheek strength was normal. Motor: normal bulk and tone, full strength in the BUE, BLE, fine finger movements normal, no pronator drift. No focal weakness Sensory: Length dependent decreased light touch decreased pinprick to mid shin bilaterally decreased vibratory to ankles proprioception normal  Coordination: finger-nose-finger, heel-to-shin bilaterally, no dysmetria Reflexes: Brachioradialis 2/2, biceps 2/2, triceps 2/2, patellar 2/2, Achilles absent, plantar responses were flexor bilaterally. Gait and Station: Rising up from seated position without assistance, normal stance, moderate stride, good arm swing, smooth turning, tandem without difficulty. No assistive device  DIAGNOSTIC DATA (LABS, IMAGING, TESTING) - I reviewed patient records, labs, notes, testing and imaging myself where available.  Lab Results  Component Value Date   WBC 7.5 02/18/2017   HGB 13.0 02/18/2017   HCT 40.3 02/18/2017   MCV 87.9 02/18/2017   PLT 225.0 02/18/2017      Component Value Date/Time   NA 140 02/18/2017 1402   NA 142 12/04/2012 0414   K 4.1 02/18/2017 1402   K 4.4 12/04/2012 0414   CL 99 02/18/2017 1402   CL 110 (H) 12/04/2012 0414   CO2 33 (H) 02/18/2017 1402   CO2 26 12/04/2012 0414   GLUCOSE 160 (H) 02/18/2017 1402   GLUCOSE 116 (H) 12/04/2012 0414   BUN 14 02/18/2017 1402   BUN 32 (H) 12/04/2012 0414   CREATININE 1.40 02/18/2017 1402   CREATININE 1.30 (H) 11/17/2016 1012   CALCIUM 10.1 02/18/2017 1402   CALCIUM 9.1 12/04/2012 0414   PROT 7.1 02/18/2017 1402   PROT 6.8 12/02/2012 2139   ALBUMIN 4.1 02/18/2017 1402   ALBUMIN 3.7  12/02/2012 2139   AST 36 02/18/2017 1402   AST 81 (H) 12/02/2012 2139   ALT 36 02/18/2017 1402   ALT 74 12/02/2012 2139   ALKPHOS 74 02/18/2017 1402   ALKPHOS 64 12/02/2012 2139   BILITOT 0.7 02/18/2017 1402   BILITOT 0.5 12/02/2012 2139   GFRNONAA 51 (L) 06/07/2015 1517   GFRNONAA 28 (L) 12/04/2012 0414   GFRAA 59 (L) 06/07/2015 1517   GFRAA 32 (L) 12/04/2012 0414   Lab Results  Component Value Date   CHOL 132 08/18/2016   HDL 46.70 08/18/2016   LDLCALC 57 08/18/2016   TRIG 143.0 08/18/2016   CHOLHDL 3 08/18/2016   Lab Results  Component Value Date   HGBA1C 7.8 05/04/2017   Lab Results  Component Value Date   WYOVZCHY85 027 05/02/2013   Lab Results  Component Value Date   TSH 3.63 08/18/2016      ASSESSMENT AND PLAN  72 y.o. year old male  has a past medical history Peripheral neuropathy, with neuropathic pain  Consistent with his diabetes, which is also under suboptimal control, most recent A1c was 7.8,  Continue Lyrica 100 mg 3 times a day, Cymbalta 60 mg daily, Metanx one tablet twice a day  He is to continue follow-up with his primary care,  Only return to clinic for new issues  Marcial Pacas, M.D. Ph.D.  [facility] [address], [place] [address] Phone: [phone] Fax:      [phone]

## 2017-07-02 NOTE — Telephone Encounter (Signed)
l mom to schedule 1 month f/u(Douglas [name] pt) after echo

## 2017-07-02 NOTE — Progress Notes (Signed)
Fax confirmation received to [employer] [phone]. Metanx.

## 2017-07-03 ENCOUNTER — Ambulatory Visit (INDEPENDENT_AMBULATORY_CARE_PROVIDER_SITE_OTHER): Payer: Medicare Other

## 2017-07-03 ENCOUNTER — Telehealth: Payer: Self-pay | Admitting: Pharmacist

## 2017-07-03 VITALS — BP 130/70 | HR 74 | Temp 98.2°F | Resp 15 | Ht 71.0 in | Wt 265.1 lb

## 2017-07-03 DIAGNOSIS — Z0000 Encounter for general adult medical examination without abnormal findings: Secondary | ICD-10-CM | POA: Diagnosis not present

## 2017-07-03 MED ORDER — LIRAGLUTIDE 18 MG/3ML ~~LOC~~ SOPN: PEN_INJECTOR | SUBCUTANEOUS | 0 refills | Status: DC

## 2017-07-03 NOTE — Progress Notes (Signed)
Subjective:   Douglas [name] is a 72 y.o. male who presents for Medicare Annual/Subsequent preventive examination.  Review of Systems:  No ROS.  Medicare Wellness Visit. Additional risk factors are reflected in the social history.  Cardiac Risk Factors include: advanced age (>54mn, >>34women);male gender;obesity (BMI >30kg/m2);hypertension;diabetes mellitus     Objective:    Vitals: BP 130/70 (BP Location: Left Arm, Patient Position: Sitting, Cuff Size: Normal)   Pulse 74   Temp 98.2 F (36.8 C) (Oral)   Resp 15   Ht 5' 11"  (1.803 m)   Wt 265 lb 1.9 oz (120.3 kg)   SpO2 96%   BMI 36.98 kg/m   Body mass index is 36.98 kg/m.  Advanced Directives 07/03/2017 08/13/2016 07/02/2016 02/04/2016 02/04/2016 08/08/2015 07/03/2015  Does Patient Have a Medical Advance Directive? No No No No No No No  Would patient like information on creating a medical advance directive? No - Patient declined No - Patient declined No - Patient declined No - patient declined information - No - patient declined information Yes -Higher education careers advisergiven    Tobacco Social History   Tobacco Use  Smoking Status Never Smoker  Smokeless Tobacco Never Used     Counseling given: Not Answered   Clinical Intake:  Pre-visit preparation completed: Yes  Pain : No/denies pain     Nutritional Status: BMI > 30  Obese Diabetes: Yes(Followed by PCP and Diabetic Specialist/Pharmacist)  How often do you need to have someone help you when you read instructions, pamphlets, or other written materials from your doctor or pharmacy?: 1 - Never  Interpreter Needed?: No     Past Medical History:  Diagnosis Date  . Arthritis   . Depression   . Diabetes mellitus without complication ([place]   . Edema    feet/legs  . GERD (gastroesophageal reflux disease)   . Gout   . Hypertension   . Hypothyroidism   . Kidney cysts    renal failure 2013  . Kidney stones    Dr. WRogers Blocker . Neuropathy    diabetes  . OSA  (obstructive sleep apnea)    supplemental oxygen at night  . Oxygen dependent    hs  . S/P cardiac cath 1998   [facility]  . Syncope and collapse   . Temporary low platelet count (HCC)    Past Surgical History:  Procedure Laterality Date  . ANAL FISSURE REPAIR    . BACK SURGERY  1977   rupt[facility] disc lumbar spine  [facility]   .Marland KitchenCATARACT EXTRACTION W/PHACO Right 07/16/2016   Procedure: CATARACT EXTRACTION PHACO AND INTRAOCULAR LENS PLACEMENT (IOC);  Surgeon: SEstill Cotta MD;  Location: [facility] ORS;  Service: Ophthalmology;  Laterality: Right;  [place]11 AP% 17.6 CDE 24.83 fluid pack lot # [id number]  . CATARACT EXTRACTION W/PHACO Left 08/13/2016   Procedure: CATARACT EXTRACTION PHACO AND INTRAOCULAR LENS PLACEMENT ([place] suture placed in left eye at end of procedure;  Surgeon: SEstill Cotta MD;  Location: [facility] ORS;  Service: Ophthalmology;  Laterality: Left;  [place]01:55 AP% 22.7 CDE 53.56 fluid pack lot # [id number]  . FINGER AMPUTATION     partial  . KNEE SURGERY  1993   arthroscopy  . SHOULDER SURGERY Bilateral 1998   arthroscopic right, rotator cuff repair left   Family History  Problem Relation Age of Onset  . Breast cancer Mother   . Lung cancer Mother   .  Bone cancer Mother   . Heart Problems Mother   . Cancer Mother        breast, lung and rib  . AAA (abdominal aortic aneurysm) Mother   . Heart disease Mother   . Heart attack Father   . Heart disease Father   . Cancer Brother        esophageal  . Heart attack Brother   . Colon cancer Neg Hx    Social History   Socioeconomic History  . Marital status: Married    Spouse name: Douglas [name]  . Number of children: 3  . Years of education: 14  . Highest education level: Not on file  Occupational History    Comment: retired  Scientific laboratory technician  . Financial resource strain: Not hard at all  . Food insecurity:    Worry: Never true    Inability: Never  true  . Transportation needs:    Medical: No    Non-medical: No  Tobacco Use  . Smoking status: Never Smoker  . Smokeless tobacco: Never Used  Substance and Sexual Activity  . Alcohol use: No  . Drug use: No  . Sexual activity: Yes  Lifestyle  . Physical activity:    Days per week: Not on file    Minutes per session: Not on file  . Stress: Not on file  Relationships  . Social connections:    Talks on phone: Not on file    Gets together: Not on file    Attends religious service: Not on file    Active member of club or organization: Not on file    Attends meetings of clubs or organizations: Not on file    Relationship status: Not on file  Other Topics Concern  . Not on file  Social History Narrative   Lives in [place] with his wife (Douglas [name]). No children. Three step children. No pets.      Work - Patient is retired. Maintenance supervisor.      School - One year college education.      Right handed.      [place], served in [place], no combat                Outpatient Encounter Medications as of 07/03/2017  Medication Sig  . allopurinol (ZYLOPRIM) 300 MG tablet TAKE 1 TABLET BY MOUTH EVERY DAY  . ALPRAZolam (XANAX) 0.5 MG tablet TAKE 1/2 TABLET BY MOUTH AT BEDTIME AS NEEDED FOR ANXIETY  . aspirin 325 MG tablet Take 325 mg by mouth daily.  . Blood Glucose Monitoring Suppl (FREESTYLE FREEDOM LITE) W/DEVICE KIT Use as directed  . Cholecalciferol (SM VITAMIN D3) 2000 units CAPS Take 8,000 Units by mouth daily.  Marland Kitchen doxazosin (CARDURA) 8 MG tablet Take 0.5 tablets (4 mg total) by mouth daily.  . DULoxetine (CYMBALTA) 60 MG capsule TAKE 1 CAPSULE(60 MG) BY MOUTH DAILY  . esomeprazole (NEXIUM) 40 MG capsule Take 1 capsule (40 mg total) by mouth daily at 12 noon.  . finasteride (PROSCAR) 5 MG tablet Take 1 tablet (5 mg total) by mouth daily.  . furosemide (LASIX) 40 MG tablet Take 1 tablet (40 mg total) by mouth daily.  Marland Kitchen glucose blood (FREESTYLE LITE) test  strip Use as directed to check blood sugar twice daily.  Dx: E11.29  . HYDROcodone-acetaminophen (NORCO/VICODIN) 5-325 MG tablet Take 1 tablet by mouth every 6 (six) hours as needed for moderate pain.  . Insulin Glargine (LANTUS) 100 UNIT/ML Solostar Pen Take 50 units  once a day.  . Insulin Pen Needle (PEN NEEDLES) 32G X 5 MM MISC 30 pens by Does not apply route daily.  Marland Kitchen l-methylfolate-B6-B12 (METANX) 3-35-2 MG TABS tablet Take 1 tablet by mouth 2 (two) times daily.  Marland Kitchen levothyroxine (SYNTHROID, LEVOTHROID) 100 MCG tablet TAKE 1 TABLET BY MOUTH EVERY DAY EXCEPT MONDAY AND WEDNESDAY, TAKE 2 TABLETS ON MONDAY AND WEDNESDAY  . liraglutide (VICTOZA) 18 MG/3ML SOPN 0.6 mg subcutaneously once a day x1 week, then 1.2 mg daily x1 week, then 1.8 mg daily thereafter  . loratadine (CLARITIN) 10 MG tablet Take 10 mg by mouth daily as needed.   Marland Kitchen losartan (COZAAR) 25 MG tablet Take 2 tablets (50 mg total) by mouth daily.  Marland Kitchen LYRICA 100 MG capsule Take 1 capsule (100 mg total) by mouth 3 (three) times daily.  . Magnesium 500 MG TABS Take 0.5 tablets by mouth daily.   . metFORMIN (GLUCOPHAGE-XR) 500 MG 24 hr tablet Take 1 tablet (500 mg total) by mouth 2 (two) times daily.  . simvastatin (ZOCOR) 40 MG tablet Take 1 tablet (40 mg total) by mouth at bedtime.  . vitamin B-12 (CYANOCOBALAMIN) 250 MCG tablet Take 250 mcg by mouth daily.    No facility-administered encounter medications on file as of 07/03/2017.     Activities of Daily Living In your present state of health, do you have any difficulty performing the following activities: 07/03/2017  Hearing? N  Vision? N  Difficulty concentrating or making decisions? N  Walking or climbing stairs? Y  Comment knee pain, bilateral  Dressing or bathing? N  Doing errands, shopping? N  Preparing Food and eating ? N  Using the Toilet? N  In the past six months, have you accidently leaked urine? Y  Comment Followed [facility]  Do you have problems with  loss of bowel control? N  Managing your Medications? N  Managing your Finances? N  Housekeeping or managing your Housekeeping? Y  Comment Wife assists  Some recent data might be hidden    Patient Care Team: [name], MD as PCP - General (Family Medicine) Anthonette Legato, MD (Internal Medicine) Watt Climes, PA as Physician Assistant (Physician Assistant)   Assessment:   This is a routine wellness examination for [place].  The goal of the wellness visit is to assist the patient how to close the gaps in care and create a preventative care plan for the patient.   The roster of all physicians providing medical care to patient is listed in the Snapshot section of the chart.  Taking calcium VIT D as appropriate/Osteoporosis risk reviewed.    Safety issues reviewed; Smoke and carbon monoxide detectors in the home. No firearms or firearms locked in a safe within the home. Wears seatbelts when driving or riding with others. No violence in the home.  They do not have excessive sun exposure.  Discussed the need for sun protection: hats, long sleeves and the use of sunscreen if there is significant sun exposure.  Patient is alert, normal appearance, oriented to person/place/and time.  Correctly identified the president of the [place] and recalls of 3/3 words. Performs simple calculations and can read correct time from watch face. Displays appropriate judgement.  No new identified risk were noted.  No failures at ADL's or IADL's.    BMI- discussed the importance of a healthy diet, water intake and the benefits of aerobic exercise. Educational material provided.   24 hour diet recall: Diabetic diet  Dental- every 6  months.  Eye- Visual acuity not assessed per patient preference since they have regular follow up with the ophthalmologist.  Wears corrective lenses.  Sleep patterns- Sleeps 8-10 hours at night.  Wakes feeling rested.  Taking medication as prescribed. O2 set at 2L when  sleeping.  Health maintenance gaps- closed.  Patient Concerns: None at this time. Follow up with PCP as needed.  Exercise Activities and Dietary recommendations Current Exercise Habits: Home exercise routine, Type of exercise: walking, Intensity: Mild  Goals    . Healthy Lifestyle      Stay hydrated Eat healthy Exercise       Fall Risk Fall Risk  07/03/2017 07/02/2016 03/03/2016 02/04/2016 11/15/2015  Falls in the past year? Yes Yes (No Data) Yes Yes  Comment - - no falls since previous visit - -  Number falls in past yr: 2 or more 1 - 2 or more 2 or more  Injury with Fall? No No - (No Data) No  Comment - - - bruises -  Risk Factor Category  - - - High Fall Risk -  Risk for fall due to : - (No Data) - History of fall(s) -  Risk for fall due to: Comment - Lean forward too far - - -  Follow up Falls prevention discussed Falls prevention discussed;Education provided - Education provided -   Depression Screen PHQ 2/9 Scores 07/03/2017 07/02/2016 02/04/2016 11/15/2015  PHQ - 2 Score 0 0 0 0    Cognitive Function MMSE - Mini Mental State Exam 07/03/2017 07/02/2016 07/03/2015  Orientation to time 5 5 5   Orientation to Place 5 5 5   Registration 3 3 3   Attention/ Calculation 5 5 5   Recall 3 3 3   Language- name 2 objects 2 2 2   Language- repeat 1 1 1   Language- follow 3 step command 3 3 3   Language- read & follow direction 1 1 1   Write a sentence 1 1 1   Copy design 1 1 1   Total score 30 30 30         Immunization History  Administered Date(s) Administered  . Influenza, High Dose Seasonal PF 02/15/2016, 02/16/2017  . Influenza,inj,Quad PF,6+ Mos 01/04/2013, 02/20/2014, 02/27/2015  . Pneumococcal Conjugate-13 05/02/2013  . Pneumococcal Polysaccharide-23 01/05/2011  . Tdap 01/05/2008    Screening Tests Health Maintenance  Topic Date Due  . HEMOGLOBIN A1C  11/01/2017  . TETANUS/TDAP  01/04/2018  . OPHTHALMOLOGY EXAM  04/20/2018  . FOOT EXAM  05/22/2018  . INFLUENZA  VACCINE  Completed  . Hepatitis C Screening  Completed  . PNA vac Low Risk Adult  Completed      Plan:   End of life planning; Advanced aging; Advanced directives discussed.  No HCPOA/Living Will.  Additional information declined at this time.  I have personally reviewed and noted the following in the patient's chart:   . Medical and social history . Use of alcohol, tobacco or illicit drugs  . Current medications and supplements . Functional ability and status . Nutritional status . Physical activity . Advanced directives . List of other physicians . Hospitalizations, surgeries, and ER visits in previous 12 months . Vitals . Screenings to include cognitive, depression, and falls . Referrals and appointments  In addition, I have reviewed and discussed with patient certain preventive protocols, quality metrics, and best practice recommendations. A written personalized care plan for preventive services as well as general preventive health recommendations were provided to patient.     Varney Biles, LPN  0/88/1103

## 2017-07-03 NOTE — Patient Instructions (Addendum)
  Douglas [name] , Thank you for taking time to come for your Medicare Wellness Visit. I appreciate your ongoing commitment to your health goals. Please review the following plan we discussed and let me know if I can assist you in the future.   Follow up with Dr. [name] as needed.    Bring a copy of your [place] and/or Living Will to be scanned into chart once completed.  Have a great day!  These are the goals we discussed: Goals    . Healthy Lifestyle      Stay hydrated Eat healthy Exercise       This is a list of the screening recommended for you and due dates:  Health Maintenance  Topic Date Due  . Hemoglobin A1C  11/01/2017  . Tetanus Vaccine  01/04/2018  . Eye exam for diabetics  04/20/2018  . Complete foot exam   05/22/2018  . Flu Shot  Completed  .  Hepatitis C: One time screening is recommended by Center for Disease Control  (CDC) for  adults born from [birth year] through 1965.   Completed  . Pneumonia vaccines  Completed

## 2017-07-03 NOTE — Telephone Encounter (Signed)
Pt out of victoza sample. Requests another. Sample med provided. Denies n/v or abdominal discomfort at 1.2 mg dose. Planning to increase to 1.8 mg this week. Reports CBGs have all been <200. Has f/u appnt with me Monday.   Carlean Jews, Pharm.D., BCPS PGY2 Ambulatory Care Pharmacy Resident Phone: [phone]

## 2017-07-06 ENCOUNTER — Ambulatory Visit (INDEPENDENT_AMBULATORY_CARE_PROVIDER_SITE_OTHER): Payer: Medicare Other | Admitting: Pharmacist

## 2017-07-06 ENCOUNTER — Telehealth: Payer: Self-pay | Admitting: Family Medicine

## 2017-07-06 ENCOUNTER — Encounter: Payer: Self-pay | Admitting: Pharmacist

## 2017-07-06 VITALS — BP 140/90 | HR 79 | Ht 72.0 in | Wt 264.0 lb

## 2017-07-06 DIAGNOSIS — I10 Essential (primary) hypertension: Secondary | ICD-10-CM | POA: Diagnosis not present

## 2017-07-06 DIAGNOSIS — E1142 Type 2 diabetes mellitus with diabetic polyneuropathy: Secondary | ICD-10-CM | POA: Diagnosis not present

## 2017-07-06 LAB — BASIC METABOLIC PANEL
BUN: 14 mg/dL (ref 6–23)
CO2: 29 mEq/L (ref 19–32)
Calcium: 9.4 mg/dL (ref 8.4–10.5)
Chloride: 102 mEq/L (ref 96–112)
Creatinine, Ser: 1.17 mg/dL (ref 0.40–1.50)
GFR: 65.11 mL/min (ref >60.00)
Glucose, Bld: 140 mg/dL — ABNORMAL HIGH (ref 70–99)
Potassium: 3.9 mEq/L (ref 3.5–5.1)
Sodium: 139 mEq/L (ref 135–145)

## 2017-07-06 MED ORDER — LIRAGLUTIDE 18 MG/3ML ~~LOC~~ SOPN: PEN_INJECTOR | SUBCUTANEOUS | 3 refills | Status: DC

## 2017-07-06 NOTE — Telephone Encounter (Signed)
Last OV 05/22/17 last filled under historical

## 2017-07-06 NOTE — Progress Notes (Signed)
S:     Chief Complaint  Patient presents with  . Medication Management    Diabetes    Patient arrives in good spirits, ambulating without assistance.  Presents for diabetes evaluation, education, and management at the request of Dr. [name] (referred on 08/19/2016). Last seen by primary care provider on 05/22/17. Last Rx Clinic visit on 06/08/2017 - at that time victoza was started and samples provided. Reports he picked up sample of Victoza and is up to 1.8 mg dose. Reports Victoza "takes appetite away". Still eating three meals a day, just eating less. Denies n/v. Brings in CBG log for review.   Self stated weight loss goal = 220 lbs  Per patient and chart review, had treadmill and nuclear stress test, results not available yet. Cardiologist adjusted BP meds with dose decrease of doxazosin and increase in losartan. No changes from neurology. Report "very little" dizziness and only when rising quickly. Denies syncope, chest pain, falls. Took meds 60 mins prior to visit. Brings in home BP cuff for calibration.   Patient reports adherence with medications.  Current diabetes medications include: Victoza 1.8 mg daily, metformin XR 500 mg BID, Lantus 50 units daily Current hypertension medications include: doxazosin 4 mg daily, furosemide 40 mg daily, losartan 50 mg daily  Patient denies hypoglycemic events.  O:  Physical Exam  Constitutional: He appears well-developed and well-nourished.     Review of Systems  All other systems reviewed and are negative.    Lab Results  Component Value Date   HGBA1C 7.8 05/04/2017   Vitals:   07/06/17 1001 07/06/17 1039  BP: (!) 157/95 140/90  Pulse: 80 79   Home BP monitor (bicep cuff) reads 165/95 mmHg, pulse of 86 BPM  Lipid Panel     Component Value Date/Time   CHOL 132 08/18/2016 1345   TRIG 143.0 08/18/2016 1345   HDL 46.70 08/18/2016 1345   CHOLHDL 3 08/18/2016 1345   VLDL 28.6 08/18/2016 1345   LDLCALC 57 08/18/2016 1345     Home fasting CBG: 90s-130s, excursions to 150s, 170s when off of Victoza 2 hour post-prandial/random CBG: high 90s- low 200s Home BP readings 100s-140s/70s-80s, excursions to 90s/50s and 90s  A/P: #Diabetes longstanding diagnosed currently with greatly improved control with elevations in post-prandial readings occasionally. Patient denies hypoglycemic events and is able to verbalize appropriate hypoglycemia management plan. Patient reports adherence with medication. Control is suboptimal due to dietary indiscretion, sedentary lifestyle. -Continue Victoza 1.8 mg daily - discussion had about affordability of medication, patient states should be affordable at tier 3 pricing.  - Decrease Lantus to 48 units daily  - Continue metformin XR 500 mg BID - Next A1C anticipated April 2019  #ASCVD risk -primary prevention,patient aged 46-26 year old withDM, baseline LDL 70-189, multiple ASCVD risk factors. Last LDL well controlled at 57 mg/dL on 08/18/2016 labs. -Continue simvastatin 40mg  at bedtime - Continue aspirin 81mg   Hypertension longstanding diagnosed currentlyuncontrolled in office with improved control at home. Tolerating increased losartan and decreased doxazosin. Control suboptimal due to timing of last antihypertensive medication dose. BMET today with improvement in SCr down to 1.17, K wnl and stable at 3.9.  -Continue losartan 50mg  daily, doxazosin 4mg  daily - Counseled on continue blood pressure readings 2 hour after medication - BMET today with recent increase in losartan  - Dietary counseling and exercise counseling provided  Written patient instructions provided.  Total time in face to face counseling 35 minutes.    Follow up in Pharmacist  Clinic Visit as needed, otherwise, follow up with Dr. [name] as scheduled.     Carlean Jews, Pharm.D., BCPS, CPP PGY2 Ambulatory Care Pharmacy Resident Phone: [phone]

## 2017-07-06 NOTE — Telephone Encounter (Signed)
Pt states he would like a refill on medication HYDROcodone-acetaminophen (NORCO/VICODIN) 5-325 MG tablet for his nerve pain. Please advise?

## 2017-07-06 NOTE — Patient Instructions (Signed)
Thanks for coming to see me!   Continue the Victoza 1.8 mg daily Decrease the Lantus to 48 units daily Labs today to check on your kidneys and electrolytes  Please follow up with Dr. [name] as scheduled. Call if any issues arise, you're doing very well though! [phone].

## 2017-07-06 NOTE — Assessment & Plan Note (Signed)
#  Diabetes longstanding diagnosed currently with greatly improved control with elevations in post-prandial readings occasionally. Patient denies hypoglycemic events and is able to verbalize appropriate hypoglycemia management plan. Patient reports adherence with medication. Control is suboptimal due to dietary indiscretion, sedentary lifestyle. -Continue Victoza 1.8 mg daily - discussion had about affordability of medication, patient states should be affordable at tier 3 pricing.  - Decrease Lantus to 48 units daily  - Continue metformin XR 500 mg BID - Next A1C anticipated April 2019  #ASCVD risk -primary prevention,patient aged 52-20 year old withDM, baseline LDL 70-189, multiple ASCVD risk factors. Last LDL well controlled at 57 mg/dL on 08/18/2016 labs. -Continue simvastatin 40mg  at bedtime - Continue aspirin 81mg

## 2017-07-06 NOTE — Progress Notes (Signed)
I have reviewed the above note and agree. I was available to the pharmacist for consultation.  Eric Sonnenberg, MD  

## 2017-07-06 NOTE — Assessment & Plan Note (Signed)
#  Hypertension longstanding diagnosed currentlyuncontrolled in office with improved control at home. Tolerating increased losartan and decreased doxazosin. Control suboptimal due to timing of last antihypertensive medication dose. BMET today with improvement in SCr down to 1.17, K wnl and stable at 3.9.  -Continue losartan 50mg  daily, doxazosin 4mg  daily - Counseled on continue blood pressure readings 2 hour after medication - BMET today with recent increase in losartan  - Dietary counseling and exercise counseling provided

## 2017-07-07 ENCOUNTER — Telehealth: Payer: Self-pay | Admitting: Pharmacist

## 2017-07-07 MED ORDER — LIRAGLUTIDE 18 MG/3ML ~~LOC~~ SOPN: PEN_INJECTOR | SUBCUTANEOUS | 3 refills | Status: DC

## 2017-07-07 NOTE — Telephone Encounter (Signed)
Noted and agree. 

## 2017-07-07 NOTE — Telephone Encounter (Signed)
Please confirm with him what he takes this for and how often he takes this.  I am fine providing a refill though he will need to come in for a controlled substance visit to sign a controlled substance contract given that this is a chronically used medication.  Thanks.

## 2017-07-07 NOTE — Telephone Encounter (Signed)
Pt called to report suspected ADE of diarrhea, nausea, abdominal cramping. Onset consistent with increase in Victoza from 1.2>1.8 mg. Advised patient to decrease Victoza to 1.2 mg and increase Lantus to 50 units as previously tolerating.   Patient verbalized understanding. Patient to call if sx do not resolve.   Carlean Jews, Pharm.D., BCPS PGY2 Ambulatory Care Pharmacy Resident Phone: [phone]

## 2017-07-08 NOTE — Telephone Encounter (Signed)
Left message to return call, ok for pec to speak to patient 

## 2017-07-14 NOTE — Telephone Encounter (Signed)
Patient states he last received this rx in feb 2017. He takes this as needed for back pain, shoulder pain and knee pain. He states he takes this 1 pill every 2-3 weeks.

## 2017-07-15 MED ORDER — HYDROCODONE-ACETAMINOPHEN 5-325 MG PO TABS: 1.0000 | ORAL_TABLET | Freq: Four times a day (QID) | ORAL | 0 refills | Status: DC | PRN

## 2017-07-15 NOTE — Telephone Encounter (Signed)
Patient notified and is scheduled 

## 2017-07-15 NOTE — Telephone Encounter (Signed)
10 pills sent to pharmacy for his refill.  He needs to set up an appointment for a controlled substance contract visit in the next 1-2 months.  This will be the only topic that would be discussed at this visit.  If he is to continue to periodically get refills of this medication he will have to have this visit completed and a controlled substance contract signed.  He should not take this medication when he takes his Xanax.  He should not drink alcohol with this medication or his Xanax.  There is risk of addiction and drowsiness with this medication.  Thanks.

## 2017-07-17 ENCOUNTER — Ambulatory Visit (INDEPENDENT_AMBULATORY_CARE_PROVIDER_SITE_OTHER): Payer: Medicare Other

## 2017-07-17 ENCOUNTER — Other Ambulatory Visit: Payer: Self-pay

## 2017-07-17 DIAGNOSIS — R0602 Shortness of breath: Secondary | ICD-10-CM

## 2017-07-19 ENCOUNTER — Other Ambulatory Visit: Payer: Self-pay | Admitting: Family Medicine

## 2017-07-22 NOTE — Telephone Encounter (Signed)
Please confirm that he has continued to take this.

## 2017-07-22 NOTE — Telephone Encounter (Signed)
Last OV 05/22/17 last filled 07/29/16 90 3rf by Dr.Cook

## 2017-07-23 NOTE — Telephone Encounter (Signed)
Patient called and he says he takes Furosemide 40 mg daily.

## 2017-07-23 NOTE — Telephone Encounter (Signed)
Left message to return call, ok for pec to speak to patient to verify he is still taking the furosemide and how often

## 2017-07-23 NOTE — Telephone Encounter (Signed)
It looks like you haven't prescribed this before. He has an appointment with you on 08-20-17. Would you like to refill

## 2017-07-23 NOTE — Telephone Encounter (Signed)
Refill given

## 2017-07-24 ENCOUNTER — Other Ambulatory Visit: Payer: Self-pay | Admitting: Family Medicine

## 2017-07-28 ENCOUNTER — Other Ambulatory Visit: Payer: Self-pay | Admitting: *Deleted

## 2017-07-28 MED ORDER — ALLOPURINOL 300 MG PO TABS: 300.0000 mg | ORAL_TABLET | Freq: Every day | ORAL | 1 refills | Status: DC

## 2017-08-10 ENCOUNTER — Encounter: Payer: Self-pay | Admitting: Physician Assistant

## 2017-08-10 NOTE — Progress Notes (Signed)
Cardiology Office Note Date:  08/13/2017  Patient ID:  Douglas [name], DOB Jun 15, 1945, MRN [id number] PCP:  [name], MD  Cardiologist:  Dr. Fletcher Anon, MD    Chief Complaint: Follow up  History of Present Illness: [name] is a 72 y.o. male with history of nonobstructive CAD by LHC in 1998, mild renal insufficiency with prior ARF, DM2 for at least 10 years duration, HTN, HLD, obesity, and BPH on doxazosin for many years who presents for follow up.   Patient was evaluated by Dr. Fletcher Anon on 06/25/2017 at the request of Dr. [name] for dizziness and syncope. At that time, the patient reported a prior stress test in 1998 followed by a cardiac cath that was negative for obstructive disease. He reported prolonged episodes of orthostatic dizziness with occasional syncope for many years. His symptoms were not suggestive of arrhythmia and felt to be secondary to his medications, especially his Cardura which was decreased to 4 mg daily with plans to follow up with Urology. His losartan was titrated to 50 mg daily. The patient also noted significant exertional dyspnea and underwent Lexiscan Myoview on 07/01/2017, that showed a small in size, mild in severity, apical anterior and apical defect that was minimally reversible and most likely represented artifact and less likely ischemia/scar, LVEF 55-65%, low risk, probably normal stress test. Echo on 07/17/2017, showed EF of 60-65%, mild concentric LVH, no RWMA, Gr1DD, trivial AI, mildly dilated LA, RVSF normal, PASP normal.   He comes in feeling well today.  He has not had any further dizziness/syncope since decreasing his dose of Cardura to 4 mg nightly.  Blood pressure at home has ranged from 353-6 20 systolic over 14-43 diastolic.  No chest pain or shortness of breath.  He has stable, mild lower extremity swelling that is worse when he is up on his feet for prolonged time.  And is improved each morning after laying in the bed overnight.  He  does note an occasional episode of palpitations approximately once every 2 weeks that will last for approximately 5 minutes before spontaneously resolving.  Not associated with any symptoms including dizziness and syncope.   Past Medical History:  Diagnosis Date  . Arthritis   . Depression   . Diabetes mellitus with complication ([place])   . Diabetic neuropathy ([place])   . Diastolic dysfunction    a. TTE 07/2017: EF 60-65%, mild concentric LVH, no RWMA, Gr1DD, trivial AI, mildly dilated LA, RVSF normal, PASP normal  . Edema    feet/legs  . GERD (gastroesophageal reflux disease)   . Gout   . History of stress test    a. MV 06/2017: small in size, mild in severity, apical anterior and apical defect that was minimally reversible and most likely represented artifact and less likely ischemia/scar, LVEF 55-65%, low risk, probably normal stress test  . Hypertension   . Hypothyroidism   . Kidney cysts    renal failure 2013  . Kidney stones    Dr. Rogers Blocker  . OSA (obstructive sleep apnea)    supplemental oxygen at night  . Oxygen dependent    hs  . S/P cardiac cath 1998   a. no obstructive disease  . Syncope and collapse   . Temporary low platelet count (HCC)     Past Surgical History:  Procedure Laterality Date  . ANAL FISSURE REPAIR    . BACK SURGERY  1977   rupture disc lumbar spine  . [place]  [facility].   Marland Kitchen CATARACT EXTRACTION W/PHACO Right 07/16/2016   Procedure: CATARACT EXTRACTION PHACO AND INTRAOCULAR LENS PLACEMENT (IOC);  Surgeon: [name], MD;  Location: [facility] ORS;  Service: Ophthalmology;  Laterality: Right;  [place] 01:11 AP% 17.6 CDE 24.83 fluid pack lot # [id number]  . CATARACT EXTRACTION W/PHACO Left 08/13/2016   Procedure: CATARACT EXTRACTION PHACO AND INTRAOCULAR LENS PLACEMENT ([place]) suture placed in left eye at end of procedure;  Surgeon: [name], MD;  Location: [facility] ORS;  Service: Ophthalmology;   Laterality: Left;  [place] 01:55 AP% 22.7 CDE 53.56 fluid pack lot # [id number]  . FINGER AMPUTATION     partial  . KNEE SURGERY  1993   arthroscopy  . SHOULDER SURGERY Bilateral 1998   arthroscopic right, rotator cuff repair left    Current Meds  Medication Sig  . allopurinol (ZYLOPRIM) 300 MG tablet Take 1 tablet (300 mg total) by mouth daily.  Marland Kitchen ALPRAZolam (XANAX) 0.5 MG tablet TAKE 1/2 TABLET BY MOUTH AT BEDTIME AS NEEDED FOR ANXIETY  . aspirin 325 MG tablet Take 325 mg by mouth daily.  . Blood Glucose Monitoring Suppl (FREESTYLE FREEDOM LITE) W/DEVICE KIT Use as directed  . Cholecalciferol (SM VITAMIN D3) 2000 units CAPS Take 8,000 Units by mouth daily.  Marland Kitchen doxazosin (CARDURA) 8 MG tablet Take 0.5 tablets (4 mg total) by mouth daily.  . DULoxetine (CYMBALTA) 60 MG capsule TAKE 1 CAPSULE(60 MG) BY MOUTH DAILY  . esomeprazole (NEXIUM) 40 MG capsule Take 1 capsule (40 mg total) by mouth daily at 12 noon.  . finasteride (PROSCAR) 5 MG tablet Take 1 tablet (5 mg total) by mouth daily.  . furosemide (LASIX) 40 MG tablet TAKE 1 TABLET(40 MG) BY MOUTH DAILY  . glucose blood (FREESTYLE LITE) test strip Use as directed to check blood sugar twice daily.  Dx: E11.29  . HYDROcodone-acetaminophen (NORCO/VICODIN) 5-325 MG tablet Take 1 tablet by mouth every 6 (six) hours as needed for moderate pain.  . Insulin Glargine (LANTUS) 100 UNIT/ML Solostar Pen Take 50 units once a day.  . Insulin Pen Needle (PEN NEEDLES) 32G X 5 MM MISC 30 pens by Does not apply route daily.  Marland Kitchen l-methylfolate-B6-B12 (METANX) 3-35-2 MG TABS tablet Take 1 tablet by mouth 2 (two) times daily.  Marland Kitchen levothyroxine (SYNTHROID, LEVOTHROID) 100 MCG tablet TAKE 1 TABLET BY MOUTH EVERY DAY EXCEPT MONDAY AND WEDNESDAY, TAKE 2 TABLETS ON MONDAY AND WEDNESDAY  . liraglutide (VICTOZA) 18 MG/3ML SOPN Inject 1.2 mg under the skin once a day.  . loratadine (CLARITIN) 10 MG tablet Take 10 mg by mouth daily as needed.   Marland Kitchen losartan (COZAAR) 25 MG  tablet Take 2 tablets (50 mg total) by mouth daily.  Marland Kitchen LYRICA 100 MG capsule Take 1 capsule (100 mg total) by mouth 3 (three) times daily.  . Magnesium 500 MG TABS Take 0.5 tablets by mouth daily.   . metFORMIN (GLUCOPHAGE-XR) 500 MG 24 hr tablet Take 1 tablet (500 mg total) by mouth 2 (two) times daily.  . simvastatin (ZOCOR) 40 MG tablet Take 1 tablet (40 mg total) by mouth at bedtime.  . vitamin B-12 (CYANOCOBALAMIN) 250 MCG tablet Take 250 mcg by mouth daily.     Allergies:   Bee venom; Dye fdc red [red dye]; Atenolol; Benadryl [diphenhydramine hcl (sleep)]; Enalapril; Gabapentin; and Hctz [hydrochlorothiazide]   Social History:  The patient  reports that he has never smoked. He has never used smokeless tobacco. He reports that he  does not drink alcohol or use drugs.   Family History:  The patient's family history includes AAA (abdominal aortic aneurysm) in his mother; Bone cancer in his mother; Breast cancer in his mother; Cancer in his brother and mother; Heart Problems in his mother; Heart attack in his brother and father; Heart disease in his father and mother; Lung cancer in his mother.  ROS:   Review of Systems  Constitutional: Negative for chills, diaphoresis, fever, malaise/fatigue and weight loss.  HENT: Negative for congestion.   Eyes: Negative for discharge and redness.  Respiratory: Negative for cough, hemoptysis, sputum production, shortness of breath and wheezing.   Cardiovascular: Positive for palpitations and leg swelling. Negative for chest pain, orthopnea, claudication and PND.  Gastrointestinal: Negative for abdominal pain, blood in stool, heartburn, melena, nausea and vomiting.  Genitourinary: Negative for hematuria.  Musculoskeletal: Negative for falls and myalgias.  Skin: Negative for rash.  Neurological: Positive for dizziness. Negative for tingling, tremors, sensory change, speech change, focal weakness, loss of consciousness and weakness.       Dizziness  resolved.  Endo/Heme/Allergies: Does not bruise/bleed easily.  Psychiatric/Behavioral: Negative for substance abuse. The patient is not nervous/anxious.   All other systems reviewed and are negative.    PHYSICAL EXAM:  VS:  BP (!) 142/76 (BP Location: Left Arm, Patient Position: Sitting, Cuff Size: Normal)   Pulse 84   Ht 6' (1.829 m)   Wt 262 lb (118.8 kg)   BMI 35.53 kg/m  BMI: Body mass index is 35.53 kg/m.  Physical Exam  Constitutional: He is oriented to person, place, and time. He appears well-developed and well-nourished.  HENT:  Head: Normocephalic and atraumatic.  Eyes: Right eye exhibits no discharge. Left eye exhibits no discharge.  Neck: Normal range of motion. No JVD present.  Cardiovascular: Normal rate, regular rhythm, S1 normal, S2 normal and normal heart sounds. Exam reveals no distant heart sounds, no friction rub, no midsystolic click and no opening snap.  No murmur heard. Pulses:      Posterior tibial pulses are 2+ on the right side, and 2+ on the left side.  Pulmonary/Chest: Effort normal and breath sounds normal. No respiratory distress. He has no decreased breath sounds. He has no wheezes. He has no rales. He exhibits no tenderness.  Abdominal: Soft. He exhibits no distension. There is no tenderness.  Musculoskeletal: He exhibits edema.  Trace bilateral pretibial edema  Neurological: He is alert and oriented to person, place, and time.  Skin: Skin is warm and dry. No cyanosis. Nails show no clubbing.  Psychiatric: He has a normal mood and affect. His speech is normal and behavior is normal. Judgment and thought content normal.     EKG:  Was not ordered today.   Recent Labs: 08/18/2016: TSH 3.63 02/18/2017: ALT 36; Hemoglobin 13.0; Platelets 225.0 07/06/2017: BUN 14; Creatinine, Ser 1.17; Potassium 3.9; Sodium 139  08/18/2016: Cholesterol 132; HDL 46.70; LDL Cholesterol 57; Total CHOL/HDL Ratio 3; Triglycerides 143.0; VLDL 28.6   CrCl cannot be calculated  (Patient's most recent lab result is older than the maximum 21 days allowed.).   Wt Readings from Last 3 Encounters:  08/13/17 262 lb (118.8 kg)  07/06/17 264 lb (119.7 kg)  07/03/17 265 lb 1.9 oz (120.3 kg)     Other studies reviewed: Additional studies/records reviewed today include: summarized above  ASSESSMENT AND PLAN:  1. Orthostatic dizziness/syncope: Resolved since decreasing dose of Cardura.  He is hesitant to fully discontinue Cardura given his issues with BPH/urinary  retention.  Recommend he discuss this with urology.  Unrevealing cardiac work-up as above.  2. Palpitations: Schedule 14-day Zio monitor.  Issue of intermittent palpitations lasting 5 minutes approximately every 2 weeks is not associated with his positional dizziness/syncopal episodes.  3. Exertional dyspnea: Resolved.  Myoview and echo as above.  4. Essential hypertension: Blood pressure is mildly elevated today though the patient just took his losartan and is having mild issues with his diabetic peripheral neuropathy.  Blood pressure readings at home have been well controlled.  Continue current dose of losartan 50 mg daily as well as lower dose Cardura.  5. Hyperlipidemia: Simvastatin.  Lipid panel from 08/2016 showed an LDL of 57.  Followed by PCP.  Disposition: F/u with Dr. Fletcher Anon in 3 months.  Current medicines are reviewed at length with the patient today.  The patient did not have any concerns regarding medicines.  Signed, Christell Faith, PA-C 08/13/2017 11:18 AM     [place] [address] [place], [place] [address] [phone]

## 2017-08-13 ENCOUNTER — Encounter: Payer: Self-pay | Admitting: Physician Assistant

## 2017-08-13 ENCOUNTER — Ambulatory Visit (INDEPENDENT_AMBULATORY_CARE_PROVIDER_SITE_OTHER): Payer: Medicare Other | Admitting: Physician Assistant

## 2017-08-13 ENCOUNTER — Ambulatory Visit (INDEPENDENT_AMBULATORY_CARE_PROVIDER_SITE_OTHER): Payer: Medicare Other

## 2017-08-13 VITALS — BP 142/76 | HR 84 | Ht 72.0 in | Wt 262.0 lb

## 2017-08-13 DIAGNOSIS — I951 Orthostatic hypotension: Secondary | ICD-10-CM | POA: Diagnosis not present

## 2017-08-13 DIAGNOSIS — E785 Hyperlipidemia, unspecified: Secondary | ICD-10-CM | POA: Diagnosis not present

## 2017-08-13 DIAGNOSIS — R0602 Shortness of breath: Secondary | ICD-10-CM | POA: Diagnosis not present

## 2017-08-13 DIAGNOSIS — R002 Palpitations: Secondary | ICD-10-CM

## 2017-08-13 DIAGNOSIS — I10 Essential (primary) hypertension: Secondary | ICD-10-CM | POA: Diagnosis not present

## 2017-08-13 DIAGNOSIS — R55 Syncope and collapse: Secondary | ICD-10-CM

## 2017-08-13 NOTE — Patient Instructions (Addendum)
Medication Instructions:  Your physician recommends that you continue on your current medications as directed. Please refer to the Current Medication list given to you today.   Labwork: NONE  Testing/Procedures: Your physician has recommended that you wear an 14 DAY ZIO event monitor. Event monitors are medical devices that record the heart's electrical activity. Doctors most often [place] these monitors to diagnose arrhythmias. Arrhythmias are problems with the speed or rhythm of the heartbeat. The monitor is a small, portable device. You can wear one while you do your normal daily activities. This is usually used to diagnose what is causing palpitations/syncope (passing out).  A Zio Patch Event Heart monitor will be applied to your chest today.  You will wear the patch for 14 days. After 24 hours, you may shower with the heart monitor on. If you feel any PALPITATIONS, you may press and release the button in the middle of the monitor.    Follow-Up: Your physician recommends that you schedule a follow-up appointment in: [place].    If you need a refill on your cardiac medications before your next appointment, please call your pharmacy.

## 2017-08-19 ENCOUNTER — Other Ambulatory Visit: Payer: Self-pay

## 2017-08-19 ENCOUNTER — Ambulatory Visit (INDEPENDENT_AMBULATORY_CARE_PROVIDER_SITE_OTHER): Payer: Medicare Other | Admitting: Family Medicine

## 2017-08-19 ENCOUNTER — Encounter: Payer: Self-pay | Admitting: Family Medicine

## 2017-08-19 VITALS — BP 144/86 | HR 87 | Temp 98.2°F | Ht 72.0 in | Wt 264.8 lb

## 2017-08-19 DIAGNOSIS — E785 Hyperlipidemia, unspecified: Secondary | ICD-10-CM

## 2017-08-19 DIAGNOSIS — F419 Anxiety disorder, unspecified: Secondary | ICD-10-CM | POA: Diagnosis not present

## 2017-08-19 DIAGNOSIS — S30860A Insect bite (nonvenomous) of lower back and pelvis, initial encounter: Secondary | ICD-10-CM | POA: Diagnosis not present

## 2017-08-19 DIAGNOSIS — E039 Hypothyroidism, unspecified: Secondary | ICD-10-CM | POA: Diagnosis not present

## 2017-08-19 DIAGNOSIS — W57XXXA Bitten or stung by nonvenomous insect and other nonvenomous arthropods, initial encounter: Secondary | ICD-10-CM | POA: Diagnosis not present

## 2017-08-19 DIAGNOSIS — E1142 Type 2 diabetes mellitus with diabetic polyneuropathy: Secondary | ICD-10-CM

## 2017-08-19 DIAGNOSIS — M792 Neuralgia and neuritis, unspecified: Secondary | ICD-10-CM | POA: Diagnosis not present

## 2017-08-19 DIAGNOSIS — G8929 Other chronic pain: Secondary | ICD-10-CM | POA: Diagnosis not present

## 2017-08-19 LAB — COMPREHENSIVE METABOLIC PANEL
ALT: 20 U/L (ref 0–53)
AST: 20 U/L (ref 0–37)
Albumin: 3.8 g/dL (ref 3.5–5.2)
Alkaline Phosphatase: 59 U/L (ref 39–117)
BUN: 14 mg/dL (ref 6–23)
CO2: 33 mEq/L — ABNORMAL HIGH (ref 19–32)
Calcium: 9.4 mg/dL (ref 8.4–10.5)
Chloride: 98 mEq/L (ref 96–112)
Creatinine, Ser: 1.52 mg/dL — ABNORMAL HIGH (ref 0.40–1.50)
GFR: 48.12 mL/min — ABNORMAL LOW (ref >60.00)
Glucose, Bld: 144 mg/dL — ABNORMAL HIGH (ref 70–99)
Potassium: 4.2 mEq/L (ref 3.5–5.1)
Sodium: 138 mEq/L (ref 135–145)
Total Bilirubin: 0.6 mg/dL (ref 0.2–1.2)
Total Protein: 6.6 g/dL (ref 6.0–8.3)

## 2017-08-19 LAB — LIPID PANEL
Cholesterol: 105 mg/dL (ref 0–200)
HDL: 39.5 mg/dL (ref >39.00)
LDL Cholesterol: 34 mg/dL (ref 0–99)
NonHDL: 65.25
Total CHOL/HDL Ratio: 3
Triglycerides: 154 mg/dL — ABNORMAL HIGH (ref 0.0–149.0)
VLDL: 30.8 mg/dL (ref 0.0–40.0)

## 2017-08-19 LAB — HEMOGLOBIN A1C: Hgb A1c MFr Bld: 6.7 % — ABNORMAL HIGH (ref 4.6–6.5)

## 2017-08-19 LAB — TSH: TSH: 0.15 u[IU]/mL — ABNORMAL LOW (ref 0.35–4.50)

## 2017-08-19 MED ORDER — HYDROCODONE-ACETAMINOPHEN 5-325 MG PO TABS: 1.0000 | ORAL_TABLET | Freq: Four times a day (QID) | ORAL | 0 refills | Status: DC | PRN

## 2017-08-19 NOTE — Assessment & Plan Note (Signed)
Check TSH 

## 2017-08-19 NOTE — Patient Instructions (Signed)
Nice to see you. We will get lab work today and contact you with the results. Please monitor the area of the tick bite.  If you develop redness at that area or you develop fevers or chills or start to feel sick please be evaluated immediately. If you develop thoughts of harming yourself please go to the emergency department.

## 2017-08-19 NOTE — Assessment & Plan Note (Signed)
Relatively well-controlled.  He is ready to discontinue Xanax.  Given that he is on such a low dose I advised that he could discontinue this.  He will monitor for symptoms.

## 2017-08-19 NOTE — Progress Notes (Signed)
Douglas Rumps, MD Phone: [phone]  Douglas [name] is a 72 y.o. male who presents today for f/u.  DIABETES Disease Monitoring: Blood Sugar ranges-104-159 Polyuria/phagia/dipsia- no      Optho- UTD Medications: Compliance- taking metformin, victoza 1.2 mg, lantus 50 mg  Anxiety: He is tapering down on Xanax.  He is taking 0.125 mg once daily.  He was on this for panic attacks previously and taking 1 mg.  He does note some mild depression mostly related to his nerve pain.  At the highest dose he was on was 1 mg.  He is on Cymbalta.  He has no SI.  He has chronic neuropathic pain in his hands and feet.  He is on Lyrica at a fairly high dose and on Cymbalta.  He infrequently takes hydrocodone for this.  He notes he will go up to several weeks without pain and then he will have pain in his hands and feet.  He is aware of the addictive potential of hydrocodone.  He does not drink alcohol or do illicit drugs.  His last prescription of 60 tablets lasted him close to 2 years.  He occasionally uses [facility].  He was seen by neurology previously though was released as there was nothing left for them to do for him.  He reports he had a tick bite on his lower back that was somewhat engorged.  Removed last week.  He has not developed any fevers, chills, or signs of illness.  He does have a history of RMSF.  He is asymptomatic currently.  Social History   Tobacco Use  Smoking Status Never Smoker  Smokeless Tobacco Never Used     ROS see history of present illness  Objective  Physical Exam Vitals:   08/19/17 0924  BP: (!) 144/86  Pulse: 87  Temp: 98.2 F (36.8 C)  SpO2: 97%    BP Readings from Last 3 Encounters:  08/19/17 (!) 144/86  08/13/17 (!) 142/76  07/06/17 140/90   Wt Readings from Last 3 Encounters:  08/19/17 264 lb 12.8 oz (120.1 kg)  08/13/17 262 lb (118.8 kg)  07/06/17 264 lb (119.7 kg)    Physical Exam  Constitutional: No distress.  Cardiovascular: Normal  rate, regular rhythm and normal heart sounds.  Pulmonary/Chest: Effort normal and breath sounds normal.  Musculoskeletal: He exhibits no edema.  Neurological: He is alert.  Skin: Skin is warm and dry. He is not diaphoretic.        Assessment/Plan: Please see individual problem list.  Hypothyroidism Check TSH.  DM type 2 with diabetic peripheral neuropathy ([place]) Check lab work.  Continue current regimen.  Diabetic neuropathy [facility]) Patient with fairly significant neuropathic pain.  He will continue his current regimen.  We discussed a pain contract today and this was signed.  Urine drug screen to be completed.  Refill of hydrocodone given to use as needed as prescribed.  Anxiety Relatively well-controlled.  He is ready to discontinue Xanax.  Given that he is on such a low dose I advised that he could discontinue this.  He will monitor for symptoms.  Tick bite of back No signs of local or systemic infection.  Discussed monitoring for signs of infection and if they occur he needs to be evaluated immediately.  He voiced understanding.   Orders Placed This Encounter  Procedures  . Lipid panel  . Comp Met (CMET)  . HgB A1c  . TSH  . Drugs of abuse scrn w alc, routine urine  Meds ordered this encounter  Medications  . HYDROcodone-acetaminophen (NORCO/VICODIN) 5-325 MG tablet    Sig: Take 1 tablet by mouth every 6 (six) hours as needed for moderate pain.    Dispense:  30 tablet    Refill:  0     Douglas Rumps, MD [place]

## 2017-08-19 NOTE — Assessment & Plan Note (Signed)
No signs of local or systemic infection.  Discussed monitoring for signs of infection and if they occur he needs to be evaluated immediately.  He voiced understanding.

## 2017-08-19 NOTE — Assessment & Plan Note (Signed)
Check lab work.  Continue current regimen. 

## 2017-08-19 NOTE — Assessment & Plan Note (Signed)
Patient with fairly significant neuropathic pain.  He will continue his current regimen.  We discussed a pain contract today and this was signed.  Urine drug screen to be completed.  Refill of hydrocodone given to use as needed as prescribed.

## 2017-08-20 LAB — URINE DRUGS OF ABUSE SCREEN W ALC, ROUTINE (REF LAB)
ALCOHOL, ETHYL (U): NEGATIVE
AMPHETAMINES (1000 ng/mL SCRN): NEGATIVE
BARBITURATES: NEGATIVE
BENZODIAZEPINES: NEGATIVE
COCAINE METABOLITES: NEGATIVE
MARIJUANA MET (50 ng/mL SCRN): NEGATIVE
METHADONE: NEGATIVE
METHAQUALONE: NEGATIVE
OPIATES: NEGATIVE
PHENCYCLIDINE: NEGATIVE
PROPOXYPHENE: NEGATIVE

## 2017-08-24 ENCOUNTER — Telehealth: Payer: Self-pay

## 2017-08-24 NOTE — Telephone Encounter (Signed)
Copied from [place] [id number]. Topic: Inquiry >> Aug 24, 2017 11:35 AM Vernona Rieger wrote: Reason for CRM: Raquel Sarna from [facility] called and needs the updated expiration number for Dr Ellen Henri DEA number. Please advise. Call back number is [phone]

## 2017-08-31 ENCOUNTER — Telehealth: Payer: Self-pay

## 2017-08-31 ENCOUNTER — Other Ambulatory Visit: Payer: Self-pay | Admitting: Family Medicine

## 2017-08-31 DIAGNOSIS — E039 Hypothyroidism, unspecified: Secondary | ICD-10-CM

## 2017-08-31 MED ORDER — LEVOTHYROXINE SODIUM 100 MCG PO TABS: 100.0000 ug | ORAL_TABLET | Freq: Every day | ORAL | 3 refills | Status: DC

## 2017-08-31 NOTE — Telephone Encounter (Signed)
Copied from [place] [id number]. Topic: Quick Communication - See Telephone Encounter >> Aug 31, 2017 12:10 PM Antonieta Iba C wrote: CRM for notification. See Telephone encounter for: 08/31/17.   Pt called in to schedule his lab visit. Pt says that he received a letter stating that he needed to complete labs in 6 weeks. Please place orders.

## 2017-08-31 NOTE — Telephone Encounter (Signed)
Copied from [place] [id number]. Topic: Quick Communication - Rx Refill/Question >> Aug 31, 2017 11:55 AM Antonieta Iba C wrote: Medication: Norvafine 75 G tip super flo technology #6   Has the patient contacted their pharmacy? No- pt says that provider sometimes have them in the office. Pt says that he completely out.   (Agent: If no, request that the patient contact the pharmacy for the refill.) (Agent: If yes, when and what did the pharmacy advise?)  Preferred Pharmacy (with phone number or street name):[id number] - [place], [address] [phone] (Phone) [phone] (Fax)      Agent: Please be advised that RX refills may take up to 3 business days. We ask that you follow-up with your pharmacy.

## 2017-08-31 NOTE — Telephone Encounter (Signed)
Please place tsh orders per lab results form 08/19/17

## 2017-08-31 NOTE — Telephone Encounter (Signed)
TSH ordered.  He was to decrease his Synthroid dose as well.  A new dose was sent to his pharmacy today.  His TSH needs to be rechecked 6 weeks after he starts the new dose.

## 2017-08-31 NOTE — Addendum Note (Signed)
Addended by: [name] on: 08/31/2017 06:15 PM   Modules accepted: Orders

## 2017-09-01 MED ORDER — PEN NEEDLES 32G X 5 MM MISC: 30.0000 "pen " | Freq: Every day | 11 refills | Status: DC

## 2017-09-01 NOTE — Telephone Encounter (Signed)
Needles sent to pharmacy

## 2017-09-01 NOTE — Telephone Encounter (Signed)
Patient notified and scheduled 

## 2017-09-03 DIAGNOSIS — R002 Palpitations: Secondary | ICD-10-CM | POA: Diagnosis not present

## 2017-09-10 ENCOUNTER — Encounter: Payer: Self-pay | Admitting: Emergency Medicine

## 2017-09-10 ENCOUNTER — Telehealth: Payer: Self-pay | Admitting: Family Medicine

## 2017-09-10 ENCOUNTER — Other Ambulatory Visit: Payer: Self-pay

## 2017-09-10 ENCOUNTER — Emergency Department
Admission: EM | Admit: 2017-09-10 | Discharge: 2017-09-11 | Disposition: A | Discharge status: Home / Self Care | Payer: Medicare Other | Attending: Emergency Medicine | Admitting: Emergency Medicine

## 2017-09-10 DIAGNOSIS — Z794 Long term (current) use of insulin: Secondary | ICD-10-CM | POA: Insufficient documentation

## 2017-09-10 DIAGNOSIS — E039 Hypothyroidism, unspecified: Secondary | ICD-10-CM | POA: Insufficient documentation

## 2017-09-10 DIAGNOSIS — Z7982 Long term (current) use of aspirin: Secondary | ICD-10-CM | POA: Diagnosis not present

## 2017-09-10 DIAGNOSIS — E1140 Type 2 diabetes mellitus with diabetic neuropathy, unspecified: Secondary | ICD-10-CM | POA: Diagnosis not present

## 2017-09-10 DIAGNOSIS — Z79899 Other long term (current) drug therapy: Secondary | ICD-10-CM | POA: Insufficient documentation

## 2017-09-10 DIAGNOSIS — I110 Hypertensive heart disease with heart failure: Secondary | ICD-10-CM | POA: Diagnosis not present

## 2017-09-10 DIAGNOSIS — R42 Dizziness and giddiness: Secondary | ICD-10-CM | POA: Diagnosis present

## 2017-09-10 DIAGNOSIS — I5032 Chronic diastolic (congestive) heart failure: Secondary | ICD-10-CM | POA: Insufficient documentation

## 2017-09-10 DIAGNOSIS — E785 Hyperlipidemia, unspecified: Secondary | ICD-10-CM | POA: Insufficient documentation

## 2017-09-10 DIAGNOSIS — E860 Dehydration: Secondary | ICD-10-CM | POA: Diagnosis not present

## 2017-09-10 DIAGNOSIS — R55 Syncope and collapse: Secondary | ICD-10-CM | POA: Diagnosis not present

## 2017-09-10 LAB — URINALYSIS, COMPLETE (UACMP) WITH MICROSCOPIC
Bacteria, UA: NONE SEEN
Bilirubin Urine: NEGATIVE
Glucose, UA: NEGATIVE mg/dL
Hgb urine dipstick: NEGATIVE
Ketones, ur: NEGATIVE mg/dL
Leukocytes, UA: NEGATIVE
Nitrite: NEGATIVE
Protein, ur: NEGATIVE mg/dL
Specific Gravity, Urine: 1.008 (ref 1.005–1.030)
Squamous Epithelial / LPF: NONE SEEN (ref 0–5)
pH: 5 (ref 5.0–8.0)

## 2017-09-10 LAB — BASIC METABOLIC PANEL
Anion gap: 10 (ref 5–15)
BUN: 14 mg/dL (ref 6–20)
CO2: 27 mmol/L (ref 22–32)
Calcium: 9 mg/dL (ref 8.9–10.3)
Chloride: 99 mmol/L — ABNORMAL LOW (ref 101–111)
Creatinine, Ser: 1.5 mg/dL — ABNORMAL HIGH (ref 0.61–1.24)
GFR calc Af Amer: 52 mL/min — ABNORMAL LOW (ref >60)
GFR calc non Af Amer: 45 mL/min — ABNORMAL LOW (ref >60)
Glucose, Bld: 159 mg/dL — ABNORMAL HIGH (ref 65–99)
Potassium: 3.7 mmol/L (ref 3.5–5.1)
Sodium: 136 mmol/L (ref 135–145)

## 2017-09-10 LAB — CBC
HCT: 36 % — ABNORMAL LOW (ref 40.0–52.0)
Hemoglobin: 11.7 g/dL — ABNORMAL LOW (ref 13.0–18.0)
MCH: 28.6 pg (ref 26.0–34.0)
MCHC: 32.5 g/dL (ref 32.0–36.0)
MCV: 87.9 fL (ref 80.0–100.0)
Platelets: 210 10*3/uL (ref 150–440)
RBC: 4.1 MIL/uL — ABNORMAL LOW (ref 4.40–5.90)
RDW: 17 % — ABNORMAL HIGH (ref 11.5–14.5)
WBC: 9.5 10*3/uL (ref 3.8–10.6)

## 2017-09-10 LAB — GLUCOSE, CAPILLARY: Glucose-Capillary: 164 mg/dL — ABNORMAL HIGH (ref 65–99)

## 2017-09-10 MED ORDER — SODIUM CHLORIDE 0.9 % IV BOLUS: 500.0000 mL | Freq: Once | INTRAVENOUS | Status: DC

## 2017-09-10 MED ORDER — SODIUM CHLORIDE 0.9 % IV BOLUS
1000.0000 mL | Freq: Once | INTRAVENOUS | Status: AC
  Administered 2017-09-10: 1000 mL via INTRAVENOUS

## 2017-09-10 NOTE — ED Provider Notes (Signed)
[facility] Emergency Department Provider Note   ____________________________________________   First MD Initiated Contact with Patient 09/10/17 2011     (approximate)  I have reviewed the triage vital signs and the nursing notes.   HISTORY  Chief Complaint Dizziness    HPI Douglas [name] is a 72 y.o. male reports been feeling "lightheaded" off and on for several months time but seem to be worse for the last 3 weeks.  Saw his primary care doctor, he is continued to have episodes of just feeling lightheaded and been told is due to his blood pressure going low for him.  Yesterday was on the hot sun, worked in the yard for about 2 hours.  Reports he just has been staying very well-hydrated after the heat.  Today while he was walking he started getting the lightheadedness to the point that he felt as though he is going to pass out but did not.  Felt very lightheaded, like his vision was fading.  No chest pain or trouble breathing.  Is able to sit down and symptoms improved, but checked his blood pressure at home and it was in the 51O systolic at the time of symptoms.  Now reports he feels much better  Also reports he restarted Lyrica about a week ago, when he did so he did had a higher dose than normal at 300 mg of a different type of the formulation potentially, and reports he felt very lightheaded and seemed a little bit off for a couple of days after that but now is improved after backing the dose down 150 mg.  No headache.  No trouble breathing.  No chest pain.  No numbness tingling or weakness in the arms or legs.  Reports same symptoms multiple times in the past but does seem worse today as though he was going to pass out.  He has passed out in the past with similar symptoms.  Currently takes both Lasix and blood pressure medication.  Past Medical History:  Diagnosis Date  . Arthritis   . Depression   . Diabetes mellitus with complication ([place])   .  Diabetic neuropathy ([place])   . Diastolic dysfunction    a. TTE 07/2017: EF 60-65%, mild concentric LVH, no RWMA, Gr1DD, trivial AI, mildly dilated LA, RVSF normal, PASP normal  . Edema    feet/legs  . GERD (gastroesophageal reflux disease)   . Gout   . History of stress test    a. MV 06/2017: small in size, mild in severity, apical anterior and apical defect that was minimally reversible and most likely represented artifact and less likely ischemia/scar, LVEF 55-65%, low risk, probably normal stress test  . Hypertension   . Hypothyroidism   . Kidney cysts    renal failure 2013  . Kidney stones    Dr. Rogers Blocker  . OSA (obstructive sleep apnea)    supplemental oxygen at night  . Oxygen dependent    hs  . S/P cardiac cath 1998   a. no obstructive disease  . Syncope and collapse   . Temporary low platelet count [facility])     Patient Active Problem List   Diagnosis Date Noted  . Tick bite of back 08/19/2017  . Abdominal pain 02/18/2017  . Clavicle enlargement 02/18/2017  . Anxiety 02/18/2017  . Degenerative arthritis of knee, bilateral 09/04/2016  . BPH (benign prostatic hyperplasia) 08/18/2016  . Knee osteoarthritis 08/18/2016  . Hyperlipidemia 05/20/2016  . Essential hypertension 05/31/2015  . Low testosterone  02/27/2015  . Diabetic neuropathy ([place]) 01/05/2013  . Obesity (BMI 30-39.9) 01/05/2013  . Chronic back pain greater than 3 months duration 01/05/2013  . Gout 01/05/2013  . OSA (obstructive sleep apnea) 01/05/2013  . Chronic kidney disease 01/04/2013  . DM type 2 with diabetic peripheral neuropathy ([place]) 01/04/2013  . Hypothyroidism 01/04/2013    Past Surgical History:  Procedure Laterality Date  . ANAL FISSURE REPAIR    . BACK SURGERY  1977   rupture disc lumbar spine  . [facility].   Marland Kitchen CATARACT EXTRACTION W/PHACO Right 07/16/2016   Procedure: CATARACT EXTRACTION PHACO AND INTRAOCULAR LENS PLACEMENT  (IOC);  Surgeon: [name], MD;  Location: [facility] ORS;  Service: Ophthalmology;  Laterality: Right;  [place] 01:11 AP% 17.6 CDE 24.83 fluid pack lot # [id number]  . CATARACT EXTRACTION W/PHACO Left 08/13/2016   Procedure: CATARACT EXTRACTION PHACO AND INTRAOCULAR LENS PLACEMENT ([place]) suture placed in left eye at end of procedure;  Surgeon: [name], MD;  Location: [facility] ORS;  Service: Ophthalmology;  Laterality: Left;  [place] 01:55 AP% 22.7 CDE 53.56 fluid pack lot # [id number]  . FINGER AMPUTATION     partial  . KNEE SURGERY  1993   arthroscopy  . SHOULDER SURGERY Bilateral 1998   arthroscopic right, rotator cuff repair left    Prior to Admission medications   Medication Sig Start Date End Date Taking? Authorizing Provider  allopurinol (ZYLOPRIM) 300 MG tablet Take 1 tablet (300 mg total) by mouth daily. 07/28/17   [name], MD  ALPRAZolam Duanne Moron) 0.5 MG tablet TAKE 1/2 TABLET BY MOUTH AT BEDTIME AS NEEDED FOR ANXIETY 06/19/17   [name], MD  aspirin 325 MG tablet Take 325 mg by mouth daily.    [provider]  Blood Glucose Monitoring Suppl (FREESTYLE FREEDOM LITE) W/DEVICE KIT Use as directed 09/13/13   Jackolyn Confer, MD  Cholecalciferol (SM VITAMIN D3) 2000 units CAPS Take 8,000 Units by mouth daily.    [provider]  doxazosin (CARDURA) 8 MG tablet Take 0.5 tablets (4 mg total) by mouth daily. 06/25/17   Wellington Hampshire, MD  DULoxetine (CYMBALTA) 60 MG capsule TAKE 1 CAPSULE(60 MG) BY MOUTH DAILY 06/22/17   [name], MD  esomeprazole (NEXIUM) 40 MG capsule Take 1 capsule (40 mg total) by mouth daily at 12 noon. 08/04/13   Jackolyn Confer, MD  finasteride (PROSCAR) 5 MG tablet Take 1 tablet (5 mg total) by mouth daily. 06/12/17   Nickie Retort, MD  furosemide (LASIX) 40 MG tablet TAKE 1 TABLET(40 MG) BY MOUTH DAILY 07/23/17   [name], MD  glucose blood (FREESTYLE LITE) test strip Use as directed to check blood sugar  twice daily.  Dx: E11.29 12/16/16   Coral Spikes, DO  HYDROcodone-acetaminophen (NORCO/VICODIN) 5-325 MG tablet Take 1 tablet by mouth every 6 (six) hours as needed for moderate pain. 08/19/17   [name], MD  Insulin Glargine (LANTUS) 100 UNIT/ML Solostar Pen Take 50 units once a day. 06/08/17   Crecencio Mc, MD  Insulin Pen Needle (PEN NEEDLES) 32G X 5 MM MISC 30 pens by Does not apply route daily. 09/01/17   [name], MD  l-methylfolate-B6-B12 (METANX) 3-35-2 MG TABS tablet Take 1 tablet by mouth 2 (two) times daily. 07/02/17   Marcial Pacas, MD  levothyroxine (SYNTHROID, LEVOTHROID) 100 MCG tablet Take 1 tablet (100 mcg total) by  mouth daily before breakfast. 08/31/17   [name], MD  liraglutide (VICTOZA) 18 MG/3ML SOPN Inject 1.2 mg under the skin once a day. 07/07/17   [name], MD  loratadine (CLARITIN) 10 MG tablet Take 10 mg by mouth daily as needed.     [provider]  losartan (COZAAR) 25 MG tablet Take 2 tablets (50 mg total) by mouth daily. 06/25/17   Wellington Hampshire, MD  LYRICA 100 MG capsule Take 1 capsule (100 mg total) by mouth 3 (three) times daily. 05/18/17   [name], MD  Magnesium 500 MG TABS Take 0.5 tablets by mouth daily.     [provider]  metFORMIN (GLUCOPHAGE-XR) 500 MG 24 hr tablet Take 1 tablet (500 mg total) by mouth 2 (two) times daily. 03/30/17   [name], MD  simvastatin (ZOCOR) 40 MG tablet Take 1 tablet (40 mg total) by mouth at bedtime. 11/03/16   Coral Spikes, DO  vitamin B-12 (CYANOCOBALAMIN) 250 MCG tablet Take 250 mcg by mouth daily.     [provider]    Allergies Bee venom; Dye fdc red [red dye]; Atenolol; Benadryl [diphenhydramine hcl (sleep)]; Enalapril; Gabapentin; and Hctz [hydrochlorothiazide]  Family History  Problem Relation Age of Onset  . Breast cancer Mother   . Lung cancer Mother   . Bone cancer Mother   . Heart Problems Mother   . Cancer Mother         breast, lung and rib  . AAA (abdominal aortic aneurysm) Mother   . Heart disease Mother   . Heart attack Father   . Heart disease Father   . Cancer Brother        esophageal  . Heart attack Brother   . Colon cancer Neg Hx     Social History Social History   Tobacco Use  . Smoking status: Never Smoker  . Smokeless tobacco: Never Used  Substance Use Topics  . Alcohol use: No  . Drug use: No    Review of Systems Constitutional: No fever/chills Eyes: No visual changes except a darkening or feeling he was in a pass out but that is now improved. ENT: No sore throat.  Slightly dry mouth.  Thirsty. Cardiovascular: Denies chest pain. Respiratory: Denies shortness of breath. Gastrointestinal: No abdominal pain.  No nausea, no vomiting.  No diarrhea.  No constipation. Genitourinary: Negative for dysuria. Musculoskeletal: Negative for back pain. Skin: Negative for rash. Neurological: Negative for headaches, focal weakness or numbness.    ____________________________________________   PHYSICAL EXAM:  VITAL SIGNS: ED Triage Vitals  Enc Vitals Group     BP 09/10/17 1740 101/63     Pulse Rate 09/10/17 1740 95     Resp 09/10/17 1740 18     Temp 09/10/17 1740 99 F (37.2 C)     Temp Source 09/10/17 1740 Oral     SpO2 09/10/17 1740 96 %     Weight 09/10/17 1742 264 lb (119.7 kg)     Height 09/10/17 1742 6' (1.829 m)     Head Circumference --      Peak Flow --      Pain Score 09/10/17 1743 6     Pain Loc --      Pain Edu? --      Excl. in [place]? --     Constitutional: Alert and oriented. Well appearing and in no acute distress. Eyes: Conjunctivae are normal. Head: Atraumatic. Nose: No congestion/rhinnorhea. Mouth/Throat: Mucous membranes are  dry. Neck: No stridor.   Cardiovascular: Normal rate, regular rhythm. Grossly normal heart sounds.  Good peripheral circulation.  No JVD. Respiratory: Normal respiratory effort.  No retractions. Lungs CTAB. Gastrointestinal: Soft and  nontender. No distention. Musculoskeletal: No lower extremity tenderness nor edema. Neurologic:  Normal speech and language. No gross focal neurologic deficits are appreciated.  5 out of 5 strength in all extremities.  No pronator drift.  No ataxia.  Extraocular movements are normal.  Speech is clear.  Facial smile equal and symmetric.  Cranial nerve exam is normal.  Sensation all extremities.  Does however have a stocking glove like neuropathy involving both feet and hands which she reports is chronic. Skin:  Skin is warm, dry and intact. No rash noted. Psychiatric: Mood and affect are normal. Speech and behavior are normal.  ____________________________________________   LABS (all labs ordered are listed, but only abnormal results are displayed)  Labs Reviewed  BASIC METABOLIC PANEL - Abnormal; Notable for the following components:      Result Value   Chloride 99 (*)    Glucose, Bld 159 (*)    Creatinine, Ser 1.50 (*)    GFR calc non Af Amer 45 (*)    GFR calc Af Amer 52 (*)    All other components within normal limits  CBC - Abnormal; Notable for the following components:   RBC 4.10 (*)    Hemoglobin 11.7 (*)    HCT 36.0 (*)    RDW 17.0 (*)    All other components within normal limits  URINALYSIS, COMPLETE (UACMP) WITH MICROSCOPIC - Abnormal; Notable for the following components:   Color, Urine YELLOW (*)    APPearance CLEAR (*)    All other components within normal limits  GLUCOSE, CAPILLARY - Abnormal; Notable for the following components:   Glucose-Capillary 164 (*)    All other components within normal limits  CBG MONITORING, ED   ____________________________________________  EKG  Reviewed enterotomy at 1740 Heart rate 95 9 QRS 89 QTC 420 Normal sinus rhythm, left ventricular hypertrophy.  No evidence of acute ischemia.  Repolarization abnormality observed ____________________________________________  RADIOLOGY  No indication noted for CT.  Patient denies any  pulmonary symptoms.  No neurologic symptoms.  Alert fully oriented with initial reassuring examination. ____________________________________________   PROCEDURES  Procedure(s) performed: None  Procedures  Critical Care performed: No  ____________________________________________   INITIAL IMPRESSION / ASSESSMENT AND PLAN / ED COURSE  Pertinent labs & imaging results that were available during my care of the patient were reviewed by me and considered in my medical decision making (see chart for details).    Patient returns for evaluation of lightheadedness.  It has been ongoing for several months time, he had a notable work-up with cardiology.  He had previous syncope with somewhat similar symptoms in his back down his blood pressure medications.  He also reports he saw his primary care doctor about 3 weeks ago with the same, lightheadedness often with standing or sitting up.  States he has been "dehydrated" after working in the yard yesterday and does feel thirsty.  He has not seen any swelling in his legs lately, he continues to take Lasix, and his exam seem to suggest possibly some mild hypovolemia without evidence of volume overload.  EKG and blood are very reassuring.  Plan to hydrate him generously with a liter of fluid here, reassess thereafter as I suspect he is correct his symptoms are likely due to mild dehydration in combination with his blood  pressure medication and use of furosemide.  Notable primary care note reports: "Echo on 07/17/2017, showed EF of 60-65%, mild concentric LVH, no RWMA, Gr1DD, trivial AI, mildly dilated LA, RVSF normal, PASP normal. "  Clinical Course as of Sep 11 2322  Thu Sep 10, 2017  2012 Creatinine(!): 1.50 [MQ]  2012 GFR, Est Non African American(!): 45 [MQ]    Clinical Course User Index [MQ] Delman Kitten, MD    ----------------------------------------- 11:24 PM on 09/10/2017 -----------------------------------------  Orthostatics reviewed,  no evidence of orthostatic hypotension.  Patient reports symptoms improved and no lightheadedness with standing or walking now.  Appears much improved.  Will discharge to home, will hold his Lasix for a day and continue to monitor his blood pressures.  Will follow closely with Dr. [name].  Return precautions and treatment recommendations and follow-up discussed with the patient who is agreeable with the plan.  ____________________________________________   FINAL CLINICAL IMPRESSION(S) / ED DIAGNOSES  Final diagnoses:  Dehydration, mild  Near syncope      NEW MEDICATIONS STARTED DURING THIS VISIT:  New Prescriptions   No medications on file     Note:  This document was prepared using Dragon voice recognition software and may include unintentional dictation errors.     Delman Kitten, MD 09/10/17 2325

## 2017-09-10 NOTE — ED Notes (Signed)
Pt given food tray. Still unable to give urine sample.

## 2017-09-10 NOTE — Telephone Encounter (Signed)
Patient came into clinic with complaint of dizziness ,  Patient stated on 09/09/17 neurologist prescribed Lyrica 150 mg to be taken 3 times daily when he received script about 3 :30 on 07/10/17 he took 600 mg at once this morning around 7 he took another 150 mg , started feel dizzy through out the day took BP at home reading 80/54 . Patient decided to come to office arrived at 4:55 pm with wife driving complained dizzy felt he might fall . Vitals taken pulse 94, 02 sat @ 97 BP sitting 120/60, ask patient to stand for BP left arm 80/50 pulse 110. Consulted finding with MD and was  Advised patient needs to go to ER. Advised patient he agreed he would go wife driving, called triage nurse at [facility] to make aware of patient arrival and assessment findings. Wheeled patient to car in wheelchair advised patient wife to take him straight to ER and pull to door and ask for assistance with wheel chair for patient to avoid falling. Patient wife stated she would.

## 2017-09-10 NOTE — ED Notes (Signed)
Pt stated that he thought that he would have been able to give sample into the cup, said that he missed the cup, will provide with a urinal

## 2017-09-10 NOTE — Telephone Encounter (Signed)
Reviewed above information.  Agree with need for ER evaluation and further treatment.

## 2017-09-10 NOTE — Discharge Instructions (Signed)
I recommend you hold your furosemide (lasix) for the next day.  Your workup including labs and EKG show reassuring results but likely indicate some mild dehydration.  Your symptoms may be due to dehydration, so it is important that you drink plenty of non-alcoholic fluids.  Please call your regular doctor as soon as possible to schedule the next available clinic appointment to follow up with him/her regarding your visit to the ED and your symptoms.  Return to the Emergency Department (ED)  if you have any further syncopal episodes (pass out again) or develop ANY chest pain, pressure, tightness, trouble breathing, sudden sweating, or other symptoms that concern you.

## 2017-09-10 NOTE — ED Triage Notes (Signed)
Pt to ED c/o dizziness x3 days intermittently, states it started after he took lyrica for his bilateral leg pain, also reports being hypotensive at home lowest being 80/50.  Has been taking BP medication as directed.  Was working out in the yard yesterday as well and became light headed.  Presents A&Ox4, speaking in complete and coherent sentences.

## 2017-09-10 NOTE — ED Notes (Signed)
Pt now using restroom, collecting sample for urinalysis

## 2017-09-10 NOTE — ED Notes (Signed)
Patient's MD called and informed us that patient is scheduled to take Lyrics 150 mg TID.  Patient took one this morning and two tablets at the same time for his second dose.  Patient is orthostatic positive going from 120/60 to 80/50.

## 2017-09-17 ENCOUNTER — Ambulatory Visit: Payer: Medicare Other

## 2017-09-17 ENCOUNTER — Encounter: Payer: Self-pay | Admitting: Urology

## 2017-09-17 ENCOUNTER — Ambulatory Visit (INDEPENDENT_AMBULATORY_CARE_PROVIDER_SITE_OTHER): Payer: Medicare Other | Admitting: Urology

## 2017-09-17 VITALS — BP 126/71 | HR 97 | Ht 72.0 in | Wt 256.6 lb

## 2017-09-17 DIAGNOSIS — N5201 Erectile dysfunction due to arterial insufficiency: Secondary | ICD-10-CM

## 2017-09-17 DIAGNOSIS — N401 Enlarged prostate with lower urinary tract symptoms: Secondary | ICD-10-CM | POA: Diagnosis not present

## 2017-09-17 DIAGNOSIS — R3912 Poor urinary stream: Secondary | ICD-10-CM | POA: Diagnosis not present

## 2017-09-17 MED ORDER — SILDENAFIL CITRATE 20 MG PO TABS: ORAL_TABLET | ORAL | 0 refills | Status: DC

## 2017-09-17 NOTE — Progress Notes (Signed)
09/17/2017 10:46 AM   Douglas [name] Jun 18, 1945 [id number]  Referring provider: [name], MD [address] [place], [place] [address]  Chief Complaint  Patient presents with  . Follow-up    HPI: 72 year old male presents for follow-up of lower urinary tract symptoms.  He saw Dr. Pilar Jarvis on 06/12/2017 for BPH with lower urinary tract symptoms.  He had been on doxazosin 8 mg and was having orthostatic symptoms with both this medication and tamsulosin.  He was started on silodosin and finasteride however the silodosin was cost prohibitive and he cut his dose of doxazosin to 4 mg.  His orthostatic symptoms have resolved and his lower urinary tract symptom improvement is maintained on this dose. He has no bothersome lower urinary tract symptoms at present.  He does have difficulty achieving and maintaining an erection.  He has taken Viagra 100 mg in the past with good efficacy however had a headache.   PMH: Past Medical History:  Diagnosis Date  . Arthritis   . Depression   . Diabetes mellitus with complication ([place])   . Diabetic neuropathy ([place])   . Diastolic dysfunction    a. TTE 07/2017: EF 60-65%, mild concentric LVH, no RWMA, Gr1DD, trivial AI, mildly dilated LA, RVSF normal, PASP normal  . Edema    feet/legs  . GERD (gastroesophageal reflux disease)   . Gout   . History of stress test    a. MV 06/2017: small in size, mild in severity, apical anterior and apical defect that was minimally reversible and most likely represented artifact and less likely ischemia/scar, LVEF 55-65%, low risk, probably normal stress test  . Hypertension   . Hypothyroidism   . Kidney cysts    renal failure 2013  . Kidney stones    Dr. Rogers Blocker  . OSA (obstructive sleep apnea)    supplemental oxygen at night  . Oxygen dependent    hs  . S/P cardiac cath 1998   a. no obstructive disease  . Syncope and collapse   . Temporary low platelet count ([place])     Surgical  History: Past Surgical History:  Procedure Laterality Date  . ANAL FISSURE REPAIR    . BACK SURGERY  1977   rupture disc lumbar spine  . [facility].   Marland Kitchen CATARACT EXTRACTION W/PHACO Right 07/16/2016   Procedure: CATARACT EXTRACTION PHACO AND INTRAOCULAR LENS PLACEMENT (IOC);  Surgeon: [name], MD;  Location: [facility] ORS;  Service: Ophthalmology;  Laterality: Right;  [place] 01:11 AP% 17.6 CDE 24.83 fluid pack lot # [id number]  . CATARACT EXTRACTION W/PHACO Left 08/13/2016   Procedure: CATARACT EXTRACTION PHACO AND INTRAOCULAR LENS PLACEMENT ([place]) suture placed in left eye at end of procedure;  Surgeon: [name], MD;  Location: [facility] ORS;  Service: Ophthalmology;  Laterality: Left;  [place] 01:55 AP% 22.7 CDE 53.56 fluid pack lot # [id number]  . FINGER AMPUTATION     partial  . KNEE SURGERY  1993   arthroscopy  . SHOULDER SURGERY Bilateral 1998   arthroscopic right, rotator cuff repair left    Home Medications:  Allergies as of 09/17/2017      Reactions   Bee Venom Swelling   Dye Fdc Red [red Dye] Swelling   Atenolol Other (See Comments)   Did not regulate blood pressure   Benadryl [diphenhydramine Hcl (sleep)] Other (See Comments)   Hyperactivity   Enalapril Itching   itching   Gabapentin Palpitations,  Rash   rash   Hctz [hydrochlorothiazide] Other (See Comments)   Decreased potassium      Medication List        Accurate as of 09/17/17 10:46 AM. Always use your most recent med list.          allopurinol 300 MG tablet Commonly known as:  ZYLOPRIM Take 1 tablet (300 mg total) by mouth daily.   ALPRAZolam 0.5 MG tablet Commonly known as:  XANAX TAKE 1/2 TABLET BY MOUTH AT BEDTIME AS NEEDED FOR ANXIETY   aspirin 325 MG tablet Take 325 mg by mouth daily.   calcitRIOL 0.25 MCG capsule Commonly known as:  ROCALTROL TK 1 C PO QD   doxazosin 8 MG tablet Commonly known as:  CARDURA Take 0.5  tablets (4 mg total) by mouth daily.   DULoxetine 60 MG capsule Commonly known as:  CYMBALTA TAKE 1 CAPSULE(60 MG) BY MOUTH DAILY   esomeprazole 40 MG capsule Commonly known as:  NEXIUM Take 1 capsule (40 mg total) by mouth daily at 12 noon.   finasteride 5 MG tablet Commonly known as:  PROSCAR Take 1 tablet (5 mg total) by mouth daily.   FREESTYLE FREEDOM LITE w/Device Kit Use as directed   furosemide 40 MG tablet Commonly known as:  LASIX TAKE 1 TABLET(40 MG) BY MOUTH DAILY   glucose blood test strip Commonly known as:  FREESTYLE LITE Use as directed to check blood sugar twice daily.  Dx: E11.29   HYDROcodone-acetaminophen 5-325 MG tablet Commonly known as:  NORCO/VICODIN Take 1 tablet by mouth every 6 (six) hours as needed for moderate pain.   Insulin Glargine 100 UNIT/ML Solostar Pen Commonly known as:  LANTUS Take 50 units once a day.   l-methylfolate-B6-B12 3-35-2 MG Tabs tablet Commonly known as:  METANX Take 1 tablet by mouth 2 (two) times daily.   levothyroxine 100 MCG tablet Commonly known as:  SYNTHROID, LEVOTHROID Take 1 tablet (100 mcg total) by mouth daily before breakfast.   liraglutide 18 MG/3ML Sopn Commonly known as:  VICTOZA Inject 1.2 mg under the skin once a day.   loratadine 10 MG tablet Commonly known as:  CLARITIN Take 10 mg by mouth daily as needed.   losartan 25 MG tablet Commonly known as:  COZAAR Take 2 tablets (50 mg total) by mouth daily.   LYRICA 100 MG capsule Generic drug:  pregabalin Take 1 capsule (100 mg total) by mouth 3 (three) times daily.   LYRICA 150 MG capsule Generic drug:  pregabalin TK ONE C PO BID   Magnesium 500 MG Tabs Take 0.5 tablets by mouth daily.   metFORMIN 500 MG 24 hr tablet Commonly known as:  GLUCOPHAGE-XR Take 1 tablet (500 mg total) by mouth 2 (two) times daily.   Pen Needles 32G X 5 MM Misc 30 pens by Does not apply route daily.   simvastatin 40 MG tablet Commonly known as:   ZOCOR Take 1 tablet (40 mg total) by mouth at bedtime.   SM VITAMIN D3 2000 units Caps Generic drug:  Cholecalciferol Take 8,000 Units by mouth daily.   vitamin B-12 250 MCG tablet Commonly known as:  CYANOCOBALAMIN Take 250 mcg by mouth daily.       Allergies:  Allergies  Allergen Reactions  . Bee Venom Swelling  . Dye Fdc Red [Red Dye] Swelling  . Atenolol Other (See Comments)    Did not regulate blood pressure  . Benadryl [Diphenhydramine Hcl (Sleep)] Other (See Comments)  Hyperactivity   . Enalapril Itching    itching  . Gabapentin Palpitations and Rash    rash  . Hctz [Hydrochlorothiazide] Other (See Comments)    Decreased potassium    Family History: Family History  Problem Relation Age of Onset  . Breast cancer Mother   . Lung cancer Mother   . Bone cancer Mother   . Heart Problems Mother   . Cancer Mother        breast, lung and rib  . AAA (abdominal aortic aneurysm) Mother   . Heart disease Mother   . Heart attack Father   . Heart disease Father   . Cancer Brother        esophageal  . Heart attack Brother   . Colon cancer Neg Hx     Social History:  reports that he has never smoked. He has never used smokeless tobacco. He reports that he does not drink alcohol or use drugs.  ROS: UROLOGY Frequent Urination?: Yes Hard to postpone urination?: No Burning/pain with urination?: No Get up at night to urinate?: No Leakage of urine?: No Urine stream starts and stops?: No Trouble starting stream?: No Do you have to strain to urinate?: No Blood in urine?: No Urinary tract infection?: No Sexually transmitted disease?: No Injury to kidneys or bladder?: Yes Painful intercourse?: No Weak stream?: Yes Erection problems?: Yes Penile pain?: No  Gastrointestinal Nausea?: No Vomiting?: No Indigestion/heartburn?: No Diarrhea?: No Constipation?: No  Constitutional Fever: No Night sweats?: No Weight loss?: No Fatigue?: No  Skin Skin  rash/lesions?: No Itching?: No  Eyes Blurred vision?: No Double vision?: No  Ears/Nose/Throat Sore throat?: No Sinus problems?: No  Hematologic/Lymphatic Swollen glands?: No Easy bruising?: No  Cardiovascular Leg swelling?: Yes Chest pain?: No  Respiratory Cough?: No Shortness of breath?: No  Endocrine Excessive thirst?: Yes  Musculoskeletal Back pain?: Yes Joint pain?: Yes  Neurological Headaches?: No Dizziness?: Yes  Psychologic Depression?: Yes Anxiety?: Yes  Physical Exam: BP 126/71 (BP Location: Left Arm, Patient Position: Sitting, Cuff Size: Large)   Pulse 97   Ht 6' (1.829 m)   Wt 256 lb 9.6 oz (116.4 kg)   SpO2 99%   BMI 34.80 kg/m   Constitutional:  Alert and oriented, No acute distress. HEENT: [place] AT, moist mucus membranes.  Trachea midline, no masses. Cardiovascular: No clubbing, cyanosis, or edema. Respiratory: Normal respiratory effort, no increased work of breathing. GI: Abdomen is soft, nontender, nondistended, no abdominal masses GU: No CVA tenderness Lymph: No cervical or inguinal lymphadenopathy. Skin: No rashes, bruises or suspicious lesions. Neurologic: Grossly intact, no focal deficits, moving all 4 extremities. Psychiatric: Normal mood and affect.  Assessment & Plan:   72 year old male with improvement in his voiding pattern with a decreased dose of doxazosin.  He has only been on finasteride for 3 months and was informed he may continue to see improvement as this medication will take 6 months to achieve maximum efficacy.  He was informed of the availability of sildenafil 20 mg which may be effective at a lower dose than 100 mg with less side effects.  An Rx was sent to his pharmacy.  Return in about 1 year (around 09/18/2018) for Recheck.   Abbie Sons, [place] [address] [place], [place] [address] [phone]

## 2017-09-20 ENCOUNTER — Other Ambulatory Visit: Payer: Self-pay | Admitting: Family Medicine

## 2017-09-28 ENCOUNTER — Encounter: Payer: Self-pay | Admitting: Pharmacist

## 2017-09-28 ENCOUNTER — Ambulatory Visit (INDEPENDENT_AMBULATORY_CARE_PROVIDER_SITE_OTHER): Payer: Medicare Other | Admitting: Pharmacist

## 2017-09-28 DIAGNOSIS — E1142 Type 2 diabetes mellitus with diabetic polyneuropathy: Secondary | ICD-10-CM | POA: Diagnosis not present

## 2017-09-28 MED ORDER — INSULIN GLARGINE 100 UNIT/ML SOLOSTAR PEN: PEN_INJECTOR | SUBCUTANEOUS | 11 refills | Status: DC

## 2017-09-28 MED ORDER — LOSARTAN POTASSIUM 25 MG PO TABS: 25.0000 mg | ORAL_TABLET | Freq: Every day | ORAL | Status: DC

## 2017-09-28 MED ORDER — LIRAGLUTIDE 18 MG/3ML ~~LOC~~ SOPN: PEN_INJECTOR | SUBCUTANEOUS | 3 refills | Status: DC

## 2017-09-28 NOTE — Assessment & Plan Note (Signed)
   Hypertension longstanding currently well controlled in office with no minimal dizziness and no near-syncope or falls at home since decrease losartan to 25 mg daily. -Continue losartan 25mg  daily,doxazosin 4mg  daily

## 2017-09-28 NOTE — Patient Instructions (Addendum)
Good to see you! Happy to hear you are doing so well.   1. Decrease your Lantus to 48 units once a day 2. Increase your victoza 1.2 mg by 1 "click" once a day until you find your max tolerated dose. If you cannot tolerate any more than 1.2 mg, move your insulin back to 50 units once a day.   Make sure you are only taking medications and doses that are prescribed to avoid any adverse reactions or side effects.  No need to follow with me anymore. If your diabetes gets out of control, let Dr. [name] know and he can set you up with the new pharmacist coming in August.

## 2017-09-28 NOTE — Progress Notes (Signed)
S:     Chief Complaint  Patient presents with  . Medication Management    Diabetes    Patient arrives in good spirits, ambulating without assistance.  Presents for diabetes evaluation, education, and management at the request of Dr. [name] (referred on 08/19/16). Last seen by primary care provider on 08/19/17 - at that time no changes were made to DM regimen and A1C was found to be improved to 6.7%. Of note, patient had recent ED visit for near-syncope and he self-reduced his losartan to 25 mg daily. Recently saw urologist and was started on sildenafil and continued on finasteride and doxazosin 4 mg daily.   Patient reports no syncope or falls since last ED visit. Had a dizzy spell last Friday after working outside. Resolved with hydration and rest. Reports CBG was 99 and BP was 119/74 during that episode. Feels well today, no dizziness. Denies n/v/d or abdominal pain with Victoza 1.2 mg daily. Has not needed to take alprazolam and has only taken 2 tabs of hydrocodone-APAP since last PCP visit. Finasteride has improved voiding pattern and sex drive per patient, has not tried sildenafil yet. Reports lyrica 100 mg TID has resolved neuropathic pain. Reports he took two 150 mg tabs when he was waiting on a shipment and "was knocked all day".  Family/Social History: less stress with family at this time  Insurance coverage/medication affordability: getting lyrica from [employer], all other meds affordable at this time.   Patient reports adherence with medications.  Current diabetes medications include: metformin 500 mg BID (max tolerated), lantus 50 units daily, victoza 1.2 mg daily (reported diarrhea, nausea, abdominal cramping with 1.8 mg).  Current hypertension medications include: furosemide 40 mg daily, losartan 25 mg daily  Patient denies hypoglycemic events.   Patient reports nocturia, improved to x1 nightly.  Patient denies pain/burning on urination.  Patient denies neuropathy since being  back on Lyrica.  Patient reports visual changes, improved light sensitivity.  Patient reports self foot exams. No issues.   O:  Physical Exam  Constitutional: He appears well-developed and well-nourished.   Review of Systems  All other systems reviewed and are negative.    Lab Results  Component Value Date   HGBA1C 6.7 (H) 08/19/2017   Vitals:   09/28/17 0858  BP: 98/63  Pulse: 83    Lipid Panel     Component Value Date/Time   CHOL 105 08/19/2017 1005   TRIG 154.0 (H) 08/19/2017 1005   HDL 39.50 08/19/2017 1005   CHOLHDL 3 08/19/2017 1005   VLDL 30.8 08/19/2017 1005   LDLCALC 34 08/19/2017 1005    Home fasting CBG: 110s-130s Home BPs: 100s-130s/60s-80s, excursions to SBP of 90s and 150s occasionally Home HRs: 80s-100s  A/P: #Diabetes longstanding currently well controlled as evidenced by A1C and CBG readings.Tolerating reduced victoza dose well.   Patient denies hypoglycemic events and is able to verbalize appropriate hypoglycemia management plan. Patient reports adherence with medication. - Decrease Lantus to 48 units daily - Increase Victoza from 1.2 mg to 1.8 mg by 1 "click" each day. Stop at max tolerated dose.  - Next A1C anticipated 11/19/17  #ASCVD risk -primary prevention,patient aged 59-19 year old withDM, baseline LDL 70-189, multiple ASCVD risk factors. Last LDL well controlled at 34 mg/dL on 08/19/2017 labs. -Continue simvastatin 40mg  at bedtime - Continue aspirin 325mg   Hypertension longstanding currently well controlled in office with no minimal dizziness and no near-syncope or falls at home since decrease losartan to 25 mg daily. -Continue  losartan 25mg  daily,doxazosin 4mg  daily  Written patient instructions provided.  Total time in face to face counseling 30 minutes.    Follow up with primary care provider.     Carlean Jews, Pharm.D., BCPS, CPP PGY2 [facility] Pharmacy Resident Phone: [phone]

## 2017-09-28 NOTE — Assessment & Plan Note (Signed)
#  Diabetes longstanding currently well controlled as evidenced by A1C and CBG readings.Tolerating reduced victoza dose well.   Patient denies hypoglycemic events and is able to verbalize appropriate hypoglycemia management plan. Patient reports adherence with medication. - Decrease Lantus to 48 units daily - Increase Victoza from 1.2 mg to 1.8 mg by 1 "click" each day. Stop at max tolerated dose.  - Next A1C anticipated 11/19/17  #ASCVD risk -primary prevention,patient aged 69-7 year old withDM, baseline LDL 70-189, multiple ASCVD risk factors. Last LDL well controlled at 34 mg/dL on 08/19/2017 labs. -Continue simvastatin 40mg  at bedtime - Continue aspirin 325mg

## 2017-09-30 ENCOUNTER — Other Ambulatory Visit: Payer: Medicare Other

## 2017-09-30 DIAGNOSIS — N2581 Secondary hyperparathyroidism of renal origin: Secondary | ICD-10-CM | POA: Diagnosis not present

## 2017-09-30 DIAGNOSIS — N179 Acute kidney failure, unspecified: Secondary | ICD-10-CM | POA: Diagnosis not present

## 2017-09-30 DIAGNOSIS — N183 Chronic kidney disease, stage 3 (moderate): Secondary | ICD-10-CM | POA: Diagnosis not present

## 2017-09-30 DIAGNOSIS — D631 Anemia in chronic kidney disease: Secondary | ICD-10-CM | POA: Diagnosis not present

## 2017-09-30 DIAGNOSIS — I10 Essential (primary) hypertension: Secondary | ICD-10-CM | POA: Diagnosis not present

## 2017-09-30 NOTE — Progress Notes (Signed)
I have reviewed the above note and agree. I was available to the pharmacist for consultation.  Dylin Breeden, MD  

## 2017-10-05 DIAGNOSIS — N2581 Secondary hyperparathyroidism of renal origin: Secondary | ICD-10-CM | POA: Diagnosis not present

## 2017-10-05 DIAGNOSIS — N183 Chronic kidney disease, stage 3 (moderate): Secondary | ICD-10-CM | POA: Diagnosis not present

## 2017-10-05 DIAGNOSIS — D631 Anemia in chronic kidney disease: Secondary | ICD-10-CM | POA: Diagnosis not present

## 2017-10-05 DIAGNOSIS — I10 Essential (primary) hypertension: Secondary | ICD-10-CM | POA: Diagnosis not present

## 2017-10-13 ENCOUNTER — Other Ambulatory Visit: Payer: Medicare Other

## 2017-10-16 ENCOUNTER — Other Ambulatory Visit: Payer: Self-pay | Admitting: Family Medicine

## 2017-10-20 ENCOUNTER — Other Ambulatory Visit: Payer: Self-pay

## 2017-10-20 MED ORDER — SIMVASTATIN 40 MG PO TABS: 40.0000 mg | ORAL_TABLET | Freq: Every day | ORAL | 3 refills | Status: DC

## 2017-10-20 NOTE — Telephone Encounter (Signed)
Last Ov 08/19/17 ;ast filled by Dr.Cook 11/03/16 [id number] [phone]

## 2017-11-01 ENCOUNTER — Other Ambulatory Visit: Payer: Self-pay | Admitting: Family Medicine

## 2017-11-03 ENCOUNTER — Other Ambulatory Visit: Payer: Self-pay | Admitting: Family Medicine

## 2017-11-04 ENCOUNTER — Other Ambulatory Visit: Payer: Self-pay

## 2017-11-04 NOTE — Telephone Encounter (Signed)
Received a fax from [employer] patient assistance stating they need a new rx for patients Lyrica

## 2017-11-05 MED ORDER — LYRICA 100 MG PO CAPS: 100.0000 mg | ORAL_CAPSULE | Freq: Three times a day (TID) | ORAL | 3 refills | Status: DC

## 2017-11-05 NOTE — Telephone Encounter (Signed)
Printed.  Please place on my desk to sign.

## 2017-11-06 MED ORDER — LYRICA 100 MG PO CAPS: 100.0000 mg | ORAL_CAPSULE | Freq: Three times a day (TID) | ORAL | 3 refills | Status: DC

## 2017-11-06 NOTE — Telephone Encounter (Signed)
faxed

## 2017-11-19 ENCOUNTER — Encounter: Payer: Self-pay | Admitting: Cardiovascular Disease

## 2017-11-19 ENCOUNTER — Ambulatory Visit (INDEPENDENT_AMBULATORY_CARE_PROVIDER_SITE_OTHER): Payer: Medicare Other | Admitting: Cardiovascular Disease

## 2017-11-19 VITALS — BP 140/80 | HR 77 | Ht 72.0 in | Wt 262.5 lb

## 2017-11-19 DIAGNOSIS — R55 Syncope and collapse: Secondary | ICD-10-CM

## 2017-11-19 DIAGNOSIS — I10 Essential (primary) hypertension: Secondary | ICD-10-CM | POA: Diagnosis not present

## 2017-11-19 DIAGNOSIS — I951 Orthostatic hypotension: Secondary | ICD-10-CM

## 2017-11-19 DIAGNOSIS — R002 Palpitations: Secondary | ICD-10-CM | POA: Diagnosis not present

## 2017-11-19 DIAGNOSIS — E785 Hyperlipidemia, unspecified: Secondary | ICD-10-CM | POA: Diagnosis not present

## 2017-11-19 MED ORDER — ASPIRIN 81 MG PO TABS: 81.0000 mg | ORAL_TABLET | Freq: Every day | ORAL | Status: DC

## 2017-11-19 NOTE — Progress Notes (Signed)
Cardiology Office Note   Date:  11/19/2017   ID:  Douglas [name], DOB 21-Oct-1945, MRN [id number]  PCP:  Douglas Haven, MD  Cardiologist:   Kathlyn Sacramento, MD   Chief Complaint  Patient presents with  . other    3 mo f/u diabetic nerve pain and back pain today. Medications verbally reviewed with pt.       History of Present Illness: Douglas [name] is a 72 y.o. male who is here today for a follow-up visit regarding dizziness with syncope thought to be due to orthostatic hypotension while on high-dose Cardura as well as exertional dyspnea.    The patient reports history of abnormal stress test in 1998 which was followed by cardiac catheterization that showed no obstructive disease.  It was done by Dr. Rex [name].  He has multiple chronic medical conditions that include hyperlipidemia, essential hypertension that started in his early 62s, type 2 diabetes of at least 10 years duration, chronic kidney disease with prior acute renal failure, obesity and BPH.  Orthostatic dizziness improved after decreasing Cardura from 8 mg to 4 mg daily.  The patient preferred to stay on this medication due to significant BPH. He underwent a nuclear stress test which showed no evidence of ischemia with normal ejection fraction.  Echocardiogram showed normal LV systolic function and no evidence of pulmonary hypertension.  His symptoms overall improved although he did go to the emergency room on May 30 with dizziness and presyncope.  He was noted to be volume depleted on labs and he was not drinking enough fluids while he was working in the yard. 14-day outpatient monitor showed normal sinus rhythm with an average heart rate of 84 bpm.  Short runs of SVT were noted with the longest lasting 11 beats.  No other significant arrhythmia.    Past Medical History:  Diagnosis Date  . Arthritis   . Depression   . Diabetes mellitus with complication ([place])   . Diabetic neuropathy ([place])   . Diastolic  dysfunction    a. TTE 07/2017: EF 60-65%, mild concentric LVH, no RWMA, Gr1DD, trivial AI, mildly dilated LA, RVSF normal, PASP normal  . Edema    feet/legs  . GERD (gastroesophageal reflux disease)   . Gout   . History of stress test    a. MV 06/2017: small in size, mild in severity, apical anterior and apical defect that was minimally reversible and most likely represented artifact and less likely ischemia/scar, LVEF 55-65%, low risk, probably normal stress test  . Hypertension   . Hypothyroidism   . Kidney cysts    renal failure 2013  . Kidney stones    Dr. Rogers Blocker  . OSA (obstructive sleep apnea)    supplemental oxygen at night  . Oxygen dependent    hs  . S/P cardiac cath 1998   a. no obstructive disease  . Syncope and collapse   . Temporary low platelet count (HCC)     Past Surgical History:  Procedure Laterality Date  . ANAL FISSURE REPAIR    . BACK SURGERY  1977   rupture disc lumbar spine  . [facility] with [facility].   Marland Kitchen CATARACT EXTRACTION W/PHACO Right 07/16/2016   Procedure: CATARACT EXTRACTION PHACO AND INTRAOCULAR LENS PLACEMENT (IOC);  Surgeon: Douglas Cotta, MD;  Location: [facility] ORS;  Service: Ophthalmology;  Laterality: Right;  [place] 01:11 AP% 17.6 CDE 24.83 fluid pack lot # [id number]  . CATARACT  EXTRACTION W/PHACO Left 08/13/2016   Procedure: CATARACT EXTRACTION PHACO AND INTRAOCULAR LENS PLACEMENT (IOC) suture placed in left eye at end of procedure;  Surgeon: Douglas Cotta, MD;  Location: [facility] ORS;  Service: Ophthalmology;  Laterality: Left;  [place] 01:55 AP% 22.7 CDE 53.56 fluid pack lot # [id number]  . FINGER AMPUTATION     partial  . KNEE SURGERY  1993   arthroscopy  . SHOULDER SURGERY Bilateral 1998   arthroscopic right, rotator cuff repair left     Current Outpatient Medications  Medication Sig Dispense Refill  . allopurinol (ZYLOPRIM) 300 MG tablet Take 1 tablet (300 mg total) by mouth daily.  90 tablet 1  . aspirin 325 MG tablet Take 325 mg by mouth daily.    . Blood Glucose Monitoring Suppl (FREESTYLE FREEDOM LITE) W/DEVICE KIT Use as directed 1 each 0  . calcitRIOL (ROCALTROL) 0.25 MCG capsule TK 1 C PO QD  5  . Cholecalciferol (SM VITAMIN D3) 2000 units CAPS Take 8,000 Units by mouth daily.    Marland Kitchen doxazosin (CARDURA) 8 MG tablet Take 0.5 tablets (4 mg total) by mouth daily. 90 tablet 0  . DULoxetine (CYMBALTA) 60 MG capsule TAKE 1 CAPSULE(60 MG) BY MOUTH DAILY 90 capsule 0  . esomeprazole (NEXIUM) 40 MG capsule Take 1 capsule (40 mg total) by mouth daily at 12 noon. 90 capsule 3  . finasteride (PROSCAR) 5 MG tablet Take 1 tablet (5 mg total) by mouth daily. 30 tablet 11  . furosemide (LASIX) 40 MG tablet TAKE 1 TABLET(40 MG) BY MOUTH DAILY 90 tablet 0  . glucose blood (FREESTYLE LITE) test strip Use as directed to check blood sugar twice daily.  Dx: E11.29 200 each 3  . HYDROcodone-acetaminophen (NORCO/VICODIN) 5-325 MG tablet Take 1 tablet by mouth every 6 (six) hours as needed for moderate pain. 30 tablet 0  . Insulin Glargine (LANTUS) 100 UNIT/ML Solostar Pen Take 48 units once a day. 15 mL 11  . Insulin Pen Needle (PEN NEEDLES) 32G X 5 MM MISC 30 pens by Does not apply route daily. 50 each 11  . l-methylfolate-B6-B12 (METANX) 3-35-2 MG TABS tablet Take 1 tablet by mouth 2 (two) times daily. 180 tablet 4  . levothyroxine (SYNTHROID, LEVOTHROID) 100 MCG tablet Take 1 tablet (100 mcg total) by mouth daily before breakfast. 90 tablet 3  . liraglutide (VICTOZA) 18 MG/3ML SOPN Inject 1.2 - 1.8 mg under the skin once a day. 3 pen 3  . loratadine (CLARITIN) 10 MG tablet Take 10 mg by mouth daily as needed.     Marland Kitchen losartan (COZAAR) 25 MG tablet Take 1 tablet (25 mg total) by mouth daily.    Marland Kitchen LYRICA 100 MG capsule Take 1 capsule (100 mg total) by mouth 3 (three) times daily. 270 capsule 3  . Magnesium 500 MG TABS Take 1 tablet by mouth daily.     . metFORMIN (GLUCOPHAGE-XR) 500 MG 24 hr  tablet Take 1 tablet (500 mg total) by mouth 2 (two) times daily. 180 tablet 3  . sildenafil (REVATIO) 20 MG tablet 2-3 tabs 1 hour prior to intercourse 10 tablet 0  . simvastatin (ZOCOR) 40 MG tablet Take 1 tablet (40 mg total) by mouth at bedtime. 90 tablet 3  . vitamin B-12 (CYANOCOBALAMIN) 250 MCG tablet Take 250 mcg by mouth daily.     Marland Kitchen doxazosin (CARDURA) 8 MG tablet TAKE 1 TABLET(8 MG) BY MOUTH AT BEDTIME (Patient not taking: Reported on 11/19/2017) 90 tablet 0   No  current facility-administered medications for this visit.     Allergies:   Bee venom; Dye fdc red [red dye]; Atenolol; Benadryl [diphenhydramine hcl (sleep)]; Enalapril; Gabapentin; and Hctz [hydrochlorothiazide]    Social History:  The patient  reports that he has never smoked. He has never used smokeless tobacco. He reports that he does not drink alcohol or use drugs.   Family History:  The patient's family history includes AAA (abdominal aortic aneurysm) in his mother; Bone cancer in his mother; Breast cancer in his mother; Cancer in his brother and mother; Heart Problems in his mother; Heart attack in his brother and father; Heart disease in his father and mother; Lung cancer in his mother.    ROS:  Please see the history of present illness.   Otherwise, review of systems are positive for none.   All other systems are reviewed and negative.    PHYSICAL EXAM: VS:  BP 140/80 (BP Location: Left Arm, Patient Position: Sitting, Cuff Size: Large)   Pulse 77   Ht 6' (1.829 m)   Wt 262 lb 8 oz (119.1 kg)   BMI 35.60 kg/m  , BMI Body mass index is 35.6 kg/m. GEN: Well nourished, well developed, in no acute distress  HEENT: normal  Neck: no JVD, carotid bruits, or masses Cardiac: RRR; no  rubs, or gallops, mild bilateral edema .  2 / 6 systolic murmur in the aortic area Respiratory:  clear to auscultation bilaterally, normal work of breathing GI: soft, nontender, nondistended, + BS MS: no deformity or atrophy  Skin:  warm and dry, no rash Neuro:  Strength and sensation are intact Psych: euthymic mood, full affect   EKG:  EKG is ordered today. The ekg ordered today demonstrates normal sinus rhythm with borderline LVH with minor repolarization abnormalities.   Recent Labs: 08/19/2017: ALT 20; TSH 0.15 09/10/2017: BUN 14; Creatinine, Ser 1.50; Hemoglobin 11.7; Platelets 210; Potassium 3.7; Sodium 136    Lipid Panel    Component Value Date/Time   CHOL 105 08/19/2017 1005   TRIG 154.0 (H) 08/19/2017 1005   HDL 39.50 08/19/2017 1005   CHOLHDL 3 08/19/2017 1005   VLDL 30.8 08/19/2017 1005   LDLCALC 34 08/19/2017 1005      Wt Readings from Last 3 Encounters:  11/19/17 262 lb 8 oz (119.1 kg)  09/28/17 259 lb (117.5 kg)  09/17/17 256 lb 9.6 oz (116.4 kg)       PAD Screen 06/25/2017  Previous PAD dx? No  Previous surgical procedure? No  Pain with walking? No  Feet/toe relief with dangling? No  Painful, non-healing ulcers? No  Extremities discolored? No      ASSESSMENT AND PLAN:  1.  Orthostatic dizziness and syncope: Symptoms improved after decreasing Cardura to 4 mg daily.  I advised him to stay well-hydrated.  2.  Exertional dyspnea: Negative work-up with stress test and echocardiogram.  3.  Essential hypertension: Blood pressure is reasonably controlled on current medications.  4.  Hyperlipidemia: Currently on simvastatin.  5.  Mild palpitations: Monitor showed only short runs of SVT.  Symptoms are not frequent enough to require treatment at this time.    Disposition:   FU with me in 1 year  Signed,  Kathlyn Sacramento, MD  11/19/2017 11:20 AM    [place]

## 2017-11-19 NOTE — Patient Instructions (Addendum)
Medication Instructions: DECREASE the Aspirin to 81 mg daily  If you need a refill on your cardiac medications before your next appointment, please call your pharmacy.   Follow-Up: Your physician wants you to follow-up in 12 months with Dr. Fletcher Anon. You will receive a reminder letter in the mail two months in advance. If you don't receive a letter, please call our office at [phone] to schedule this follow-up appointment.  Thank you for choosing [facility] at [facility]!

## 2017-11-20 ENCOUNTER — Ambulatory Visit (INDEPENDENT_AMBULATORY_CARE_PROVIDER_SITE_OTHER): Payer: Medicare Other | Admitting: Family Medicine

## 2017-11-20 ENCOUNTER — Encounter: Payer: Self-pay | Admitting: Family Medicine

## 2017-11-20 ENCOUNTER — Ambulatory Visit (INDEPENDENT_AMBULATORY_CARE_PROVIDER_SITE_OTHER): Payer: Medicare Other

## 2017-11-20 VITALS — BP 138/84 | HR 84 | Temp 98.1°F | Ht 72.0 in | Wt 263.0 lb

## 2017-11-20 DIAGNOSIS — M47812 Spondylosis without myelopathy or radiculopathy, cervical region: Secondary | ICD-10-CM | POA: Diagnosis not present

## 2017-11-20 DIAGNOSIS — M79674 Pain in right toe(s): Secondary | ICD-10-CM | POA: Insufficient documentation

## 2017-11-20 DIAGNOSIS — W57XXXA Bitten or stung by nonvenomous insect and other nonvenomous arthropods, initial encounter: Secondary | ICD-10-CM | POA: Diagnosis not present

## 2017-11-20 DIAGNOSIS — S30860A Insect bite (nonvenomous) of lower back and pelvis, initial encounter: Secondary | ICD-10-CM | POA: Diagnosis not present

## 2017-11-20 DIAGNOSIS — M5412 Radiculopathy, cervical region: Secondary | ICD-10-CM | POA: Insufficient documentation

## 2017-11-20 DIAGNOSIS — M79675 Pain in left toe(s): Secondary | ICD-10-CM

## 2017-11-20 DIAGNOSIS — E1142 Type 2 diabetes mellitus with diabetic polyneuropathy: Secondary | ICD-10-CM

## 2017-11-20 MED ORDER — DOXYCYCLINE HYCLATE 100 MG PO TABS: 100.0000 mg | ORAL_TABLET | Freq: Two times a day (BID) | ORAL | 0 refills | Status: DC

## 2017-11-20 NOTE — Progress Notes (Signed)
[name], MD Phone: [phone]  Douglas [name] is a 72 y.o. male who presents today for f/u.  CC: DM, tick bite, toe pain, neck pain  DIABETES Disease Monitoring: Blood Sugar ranges-<160 fasting Polyuria/phagia/dipsia- dipsia      Optho- UTD Medications: Compliance- taking lantus 48 U, victoza, metformin Hypoglycemic symptoms- no Reports Victoza is expensive.  Tick bite: This is over his lower back.  He thinks it was attached for about a week.  It was slightly engorged.  He has had some mild headaches and has felt feverish with this.  Overall does not feel very well.  Some itching at the site of the tick bite.  Toe pain: Patient notes discomfort in the tibial aspects of his bilateral great toes.  It occurs for a couple of days at a time over the last couple weeks.  Feels like he cannot really touch it due to the discomfort.  Does have a history of gout and ingrown toenails.  Left neck pain: This is occurring in his trapezius muscle.  Feels tight.  Does not radiate down his arm though does have numbness at times on 2 occasions over the last month over the dorsal aspect of his left arm.  Has not had a recent x-ray.  No weakness.   Social History   Tobacco Use  Smoking Status Never Smoker  Smokeless Tobacco Never Used     ROS see history of present illness  Objective  Physical Exam Vitals:   11/20/17 1419  BP: 138/84  Pulse: 84  Temp: 98.1 F (36.7 C)  SpO2: 97%    BP Readings from Last 3 Encounters:  11/20/17 138/84  11/19/17 140/80  09/28/17 98/63   Wt Readings from Last 3 Encounters:  11/20/17 263 lb (119.3 kg)  11/19/17 262 lb 8 oz (119.1 kg)  09/28/17 259 lb (117.5 kg)    Physical Exam  Constitutional: No distress.  Cardiovascular: Normal rate, regular rhythm and normal heart sounds.  Pulmonary/Chest: Effort normal and breath sounds normal.  Musculoskeletal: He exhibits no edema.  Neck tenderness, no midline neck step-off, mild tenderness  of the left trapezius muscle  Neurological: He is alert.  5/5 strength in bilateral biceps, triceps, grip, quads, hamstrings, plantar and dorsiflexion, sensation to light touch intact in bilateral UE and LE, normal gait  Skin: Skin is warm and dry. He is not diaphoretic.     Bilateral great toes with no erythema, warmth, swelling, or tenderness, no ingrown toenails noted, no podagra noted  Assessment/Plan: Please see individual problem list.  DM type 2 with diabetic peripheral neuropathy ([place]) Improving control.  Check A1c today.  Tick bite of back Tick bite with some systemic symptoms concerning for possible tickborne illness.  He has tolerated tetracycline previously and we will place him on doxycycline.  We will check an RMSF antibody.  If he starts to feel worse he will go to the emergency room.  Cervical radiculopathy Concern for nerve compression in his neck given intermittent dorsal aspect left arm numbness.  He is neurologically intact in his upper and lower extremities at this time.  Will obtain cervical spine film today and then determine if he needs more advanced imaging.  Toe pain, bilateral Bilateral great toe pain.  Undetermined cause.  No specific findings on exam to indicate any cause.  He will contact his podiatrist for follow-up.   Orders Placed This Encounter  Procedures  . DG Cervical Spine Complete    Standing Status:   Future  Number of Occurrences:   1    Standing Expiration Date:   01/21/2019    Order Specific Question:   Reason for Exam (SYMPTOM  OR DIAGNOSIS REQUIRED)    Answer:   neck pain radiating to left arm with intermittent numbness dorsal aspect left arm    Order Specific Question:   Preferred imaging location?    Answer:   [facility] Specific Question:   Radiology Contrast Protocol - do NOT remove file path    Answer:   [email or web address]  . Rocky mtn spotted fvr abs pnl(IgG+IgM)  .  HgB A1c    Meds ordered this encounter  Medications  . doxycycline (VIBRA-TABS) 100 MG tablet    Sig: Take 1 tablet (100 mg total) by mouth 2 (two) times daily.    Dispense:  14 tablet    Refill:  0  Patient will avoid sun exposure with the doxycycline.  He reports the red dye allergy is noted to be him developing pain in his feet after he was exposed to red dye.   [name], MD [place]

## 2017-11-20 NOTE — Patient Instructions (Signed)
Nice to see you. We will treat you with doxycycline for your possible tickborne illness.  We will check lab work and contact you with the results. We will get an x-ray of your neck to evaluate for cause of your symptoms in your neck and arm. Please see your foot doctor for your toes.

## 2017-11-20 NOTE — Assessment & Plan Note (Signed)
Improving control.  Check A1c today.

## 2017-11-20 NOTE — Assessment & Plan Note (Signed)
Tick bite with some systemic symptoms concerning for possible tickborne illness.  He has tolerated tetracycline previously and we will place him on doxycycline.  We will check an RMSF antibody.  If he starts to feel worse he will go to the emergency room.

## 2017-11-20 NOTE — Assessment & Plan Note (Addendum)
Concern for nerve compression in his neck given intermittent dorsal aspect left arm numbness.  He is neurologically intact in his upper and lower extremities at this time.  Will obtain cervical spine film today and then determine if he needs more advanced imaging.

## 2017-11-20 NOTE — Assessment & Plan Note (Signed)
Bilateral great toe pain.  Undetermined cause.  No specific findings on exam to indicate any cause.  He will contact his podiatrist for follow-up.

## 2017-11-23 ENCOUNTER — Telehealth: Payer: Self-pay | Admitting: Pharmacist

## 2017-11-23 LAB — HEMOGLOBIN A1C
Hgb A1c MFr Bld: 6.2 % of total Hgb — ABNORMAL HIGH (ref <5.7)
Mean Plasma Glucose: 131 (calc)
eAG (mmol/L): 7.3 (calc)

## 2017-11-23 LAB — ROCKY MTN SPOTTED FVR ABS PNL(IGG+IGM)
RMSF IgG: DETECTED — AB
RMSF IgM: NOT DETECTED

## 2017-11-23 LAB — REFLEX RMSF IGG TITER: RMSF IgG Titer: 1:256 {titer} — ABNORMAL HIGH

## 2017-11-23 NOTE — Telephone Encounter (Signed)
Contacted [name] at the request of Dr. [name]. Patient expressed difficulty affording Victoza.   Can apply for patient assistance through [employer]. Requirements include:  - Patient to have spent $1000 out of pocket on prescription medications this calendar year - Proof of household income   Patient's wife stated that Douglas [name] was still asleep. Will f/u later today.   Catie Darnelle Maffucci, PharmD PGY2 Ambulatory Care Pharmacy Resident Phone: [phone]

## 2017-11-23 NOTE — Telephone Encounter (Signed)
Patient states he has meet OOP requirement for Victoza patient assistance.   Scheduled appointment next Monday, 8/19 for patient to come into office and fill out paperwork.   Catie Darnelle Maffucci, PharmD PGY2 Ambulatory Care Pharmacy Resident Phone: [phone]

## 2017-11-24 ENCOUNTER — Other Ambulatory Visit: Payer: Self-pay | Admitting: Family Medicine

## 2017-11-24 DIAGNOSIS — M5412 Radiculopathy, cervical region: Secondary | ICD-10-CM

## 2017-11-24 MED ORDER — DOXYCYCLINE HYCLATE 100 MG PO TABS: 100.0000 mg | ORAL_TABLET | Freq: Two times a day (BID) | ORAL | 0 refills | Status: DC

## 2017-11-25 ENCOUNTER — Encounter: Payer: Self-pay | Admitting: Podiatry

## 2017-11-25 ENCOUNTER — Ambulatory Visit (INDEPENDENT_AMBULATORY_CARE_PROVIDER_SITE_OTHER): Payer: Medicare Other | Admitting: Podiatry

## 2017-11-25 VITALS — BP 168/93 | HR 78 | Resp 16

## 2017-11-25 DIAGNOSIS — E1142 Type 2 diabetes mellitus with diabetic polyneuropathy: Secondary | ICD-10-CM

## 2017-11-25 NOTE — Progress Notes (Signed)
Subjective:  Patient ID: Douglas [name], male    DOB: 09-Aug-1945,  MRN: [id number] HPI Chief Complaint  Patient presents with  . Toe Pain    Hallux bilateral - ingrown procedure in 2014, feeling a "pinching" sensation x several months, did have some bleeding medial border of left - no treatment  . New Patient (Initial Visit)    72 y.o. male presents with the above complaint.   ROS: Denies fever chills nausea vomiting muscle aches pains calf pain back pain chest pain shortness of breath.  Relates severe neuropathic pain times many years.  Relates a history of back pain but none currently.  Past Medical History:  Diagnosis Date  . Arthritis   . Depression   . Diabetes mellitus with complication ([place])   . Diabetic neuropathy ([place])   . Diastolic dysfunction    a. TTE 07/2017: EF 60-65%, mild concentric LVH, no RWMA, Gr1DD, trivial AI, mildly dilated LA, RVSF normal, PASP normal  . Edema    feet/legs  . GERD (gastroesophageal reflux disease)   . Gout   . History of stress test    a. MV 06/2017: small in size, mild in severity, apical anterior and apical defect that was minimally reversible and most likely represented artifact and less likely ischemia/scar, LVEF 55-65%, low risk, probably normal stress test  . Hypertension   . Hypothyroidism   . Kidney cysts    renal failure 2013  . Kidney stones    Dr. Rogers Blocker  . OSA (obstructive sleep apnea)    supplemental oxygen at night  . Oxygen dependent    hs  . S/P cardiac cath 1998   a. no obstructive disease  . Syncope and collapse   . Temporary low platelet count (HCC)    Past Surgical History:  Procedure Laterality Date  . ANAL FISSURE REPAIR    . BACK SURGERY  1977   rupture disc lumbar spine  . [facility] with [facility].   Marland Kitchen CATARACT EXTRACTION W/PHACO Right 07/16/2016   Procedure: CATARACT EXTRACTION PHACO AND INTRAOCULAR LENS PLACEMENT (IOC);  Surgeon: [name], MD;  Location: [facility] ORS;  Service: Ophthalmology;  Laterality: Right;  [place] 01:11 AP% 17.6 CDE 24.83 fluid pack lot # [id number]  . CATARACT EXTRACTION W/PHACO Left 08/13/2016   Procedure: CATARACT EXTRACTION PHACO AND INTRAOCULAR LENS PLACEMENT ([place]) suture placed in left eye at end of procedure;  Surgeon: [name], MD;  Location: [facility] ORS;  Service: Ophthalmology;  Laterality: Left;  [place] 01:55 AP% 22.7 CDE 53.56 fluid pack lot # [id number]  . FINGER AMPUTATION     partial  . KNEE SURGERY  1993   arthroscopy  . SHOULDER SURGERY Bilateral 1998   arthroscopic right, rotator cuff repair left    Current Outpatient Medications:  .  allopurinol (ZYLOPRIM) 300 MG tablet, Take 1 tablet (300 mg total) by mouth daily., Disp: 90 tablet, Rfl: 1 .  aspirin 81 MG tablet, Take 1 tablet (81 mg total) by mouth daily., Disp: , Rfl:  .  Blood Glucose Monitoring Suppl (FREESTYLE FREEDOM LITE) W/DEVICE KIT, Use as directed, Disp: 1 each, Rfl: 0 .  calcitRIOL (ROCALTROL) 0.25 MCG capsule, TK 1 C PO QD, Disp: , Rfl: 5 .  Cholecalciferol (SM VITAMIN D3) 2000 units CAPS, Take 8,000 Units by mouth daily., Disp: , Rfl:  .  doxazosin (CARDURA) 8 MG tablet, Take 0.5 tablets (4 mg total) by mouth daily., Disp: 90 tablet, Rfl: 0 .  doxycycline (VIBRA-TABS) 100 MG tablet, Take 1 tablet (100 mg total) by mouth 2 (two) times daily., Disp: 28 tablet, Rfl: 0 .  DULoxetine (CYMBALTA) 60 MG capsule, TAKE 1 CAPSULE(60 MG) BY MOUTH DAILY, Disp: 90 capsule, Rfl: 0 .  esomeprazole (NEXIUM) 40 MG capsule, Take 1 capsule (40 mg total) by mouth daily at 12 noon., Disp: 90 capsule, Rfl: 3 .  finasteride (PROSCAR) 5 MG tablet, Take 1 tablet (5 mg total) by mouth daily., Disp: 30 tablet, Rfl: 11 .  furosemide (LASIX) 40 MG tablet, TAKE 1 TABLET(40 MG) BY MOUTH DAILY, Disp: 90 tablet, Rfl: 0 .  glucose blood (FREESTYLE LITE) test strip, Use as directed to check blood sugar twice daily.  Dx: E11.29, Disp: 200 each, Rfl:  3 .  HYDROcodone-acetaminophen (NORCO/VICODIN) 5-325 MG tablet, Take 1 tablet by mouth every 6 (six) hours as needed for moderate pain., Disp: 30 tablet, Rfl: 0 .  Insulin Glargine (LANTUS) 100 UNIT/ML Solostar Pen, Take 48 units once a day., Disp: 15 mL, Rfl: 11 .  Insulin Pen Needle (PEN NEEDLES) 32G X 5 MM MISC, 30 pens by Does not apply route daily., Disp: 50 each, Rfl: 11 .  l-methylfolate-B6-B12 (METANX) 3-35-2 MG TABS tablet, Take 1 tablet by mouth 2 (two) times daily., Disp: 180 tablet, Rfl: 4 .  levothyroxine (SYNTHROID, LEVOTHROID) 100 MCG tablet, Take 1 tablet (100 mcg total) by mouth daily before breakfast., Disp: 90 tablet, Rfl: 3 .  liraglutide (VICTOZA) 18 MG/3ML SOPN, Inject 1.2 - 1.8 mg under the skin once a day., Disp: 3 pen, Rfl: 3 .  loratadine (CLARITIN) 10 MG tablet, Take 10 mg by mouth daily as needed. , Disp: , Rfl:  .  losartan (COZAAR) 25 MG tablet, Take 1 tablet (25 mg total) by mouth daily., Disp: , Rfl:  .  LYRICA 100 MG capsule, Take 1 capsule (100 mg total) by mouth 3 (three) times daily., Disp: 270 capsule, Rfl: 3 .  Magnesium 500 MG TABS, Take 1 tablet by mouth daily. , Disp: , Rfl:  .  metFORMIN (GLUCOPHAGE-XR) 500 MG 24 hr tablet, Take 1 tablet (500 mg total) by mouth 2 (two) times daily., Disp: 180 tablet, Rfl: 3 .  sildenafil (REVATIO) 20 MG tablet, 2-3 tabs 1 hour prior to intercourse, Disp: 10 tablet, Rfl: 0 .  simvastatin (ZOCOR) 40 MG tablet, Take 1 tablet (40 mg total) by mouth at bedtime., Disp: 90 tablet, Rfl: 3 .  vitamin B-12 (CYANOCOBALAMIN) 250 MCG tablet, Take 250 mcg by mouth daily. , Disp: , Rfl:   Allergies  Allergen Reactions  . Bee Venom Swelling  . Dye Fdc Red [Red Dye] Swelling  . Atenolol Other (See Comments)    Did not regulate blood pressure  . Benadryl [Diphenhydramine Hcl (Sleep)] Other (See Comments)    Hyperactivity   . Enalapril Itching    itching  . Gabapentin Palpitations and Rash    rash  . Hctz [Hydrochlorothiazide]  Other (See Comments)    Decreased potassium   Review of Systems Objective:   Vitals:   11/25/17 0917  BP: (!) 168/93  Pulse: 78  Resp: 16    General: Well developed, nourished, in no acute distress, alert and oriented x3   Dermatological: Skin is warm, dry and supple bilateral. Nails x 10 are well maintained; remaining integument appears unremarkable at this time. There are no open sores, no preulcerative lesions, no rash or signs of infection present.  Vascular: Dorsalis Pedis artery and Posterior Tibial artery pedal  pulses are 2/4 bilateral with immedate capillary fill time. Pedal hair growth present. No varicosities and no lower extremity edema present bilateral.   Neruologic: Grossly intact via light touch bilateral. Vibratory intact via tuning fork bilateral. Protective threshold with Semmes Wienstein monofilament intact to all pedal sites bilateral. Patellar and Achilles deep tendon reflexes 2+ bilateral. No Babinski or clonus noted bilateral.  Complete loss of sensorium per Semmes Weinstein monofilament to the level of the calf.  Deep tendon reflexes are minimally elicitable deep pain is still present.  Musculoskeletal: No gross boney pedal deformities bilateral. No pain, crepitus, or limitation noted with foot and ankle range of motion bilateral. Muscular strength 5/5 in all groups tested bilateral.  Gait: Unassisted, Nonantalgic.    Radiographs:  None taken  Assessment & Plan:   Assessment: Severe diabetic peripheral neuropathy no open lesions or wounds are noted.  Plan: At this point I am going to request that we refer him to [place] [facility] that he may be evaluated by Dr. Clydell Hakim for possible implantable stimulator.     Stefan Markarian T. [place], [place]

## 2017-11-30 ENCOUNTER — Other Ambulatory Visit: Payer: Self-pay | Admitting: Family Medicine

## 2017-11-30 ENCOUNTER — Ambulatory Visit (INDEPENDENT_AMBULATORY_CARE_PROVIDER_SITE_OTHER): Payer: Medicare Other | Admitting: Pharmacist

## 2017-11-30 ENCOUNTER — Encounter: Payer: Self-pay | Admitting: Pharmacist

## 2017-11-30 VITALS — BP 130/77 | HR 96 | Wt 262.2 lb

## 2017-11-30 DIAGNOSIS — M549 Dorsalgia, unspecified: Secondary | ICD-10-CM

## 2017-11-30 DIAGNOSIS — G8929 Other chronic pain: Secondary | ICD-10-CM | POA: Diagnosis not present

## 2017-11-30 DIAGNOSIS — E1142 Type 2 diabetes mellitus with diabetic polyneuropathy: Secondary | ICD-10-CM

## 2017-11-30 DIAGNOSIS — I10 Essential (primary) hypertension: Secondary | ICD-10-CM

## 2017-11-30 MED ORDER — LOSARTAN POTASSIUM 25 MG PO TABS
25.0000 mg | ORAL_TABLET | Freq: Two times a day (BID) | ORAL | 2 refills | Status: DC
Start: 2017-11-30 — End: 2018-03-09

## 2017-11-30 NOTE — Assessment & Plan Note (Signed)
#  Diabetes longstanding currently controlled. Patient denies hypoglycemic events. Patient reports adherence with medication. Patient out of pocket spend qualifies for Victoza and Lantus patient assistance - Completed Victoza (NovoNordisk) and Lantus (Sanofi) patient assistance applications. Will f/u with patient assistance companies in 5-7 business days - Next A1C anticipated 02/2018.

## 2017-11-30 NOTE — Assessment & Plan Note (Signed)
#  Hypertension longstanding currently at goal, though often elevated at home per patient report.  Patient reports adherence with medication, though increased losartan to 25 mg BID a few times. - Trial losartan 25 mg BID; continue doxazosin 4 mg HS.  - Scheduled BMP in 7-10 days to evaluate Scr, K with increased ACEi dose

## 2017-11-30 NOTE — Patient Instructions (Signed)
It was great to meet you today!   You can increase losartan to twice daily (25 mg twice daily) and continue the doxazosin 4 mg in the evening.    Schedule a follow up lab appointment in 7-10 days to check your kidney function.    I'll submit the Patient Assistance Application for Trulicity and Lantus and I will reach out to you with follow up.   Thanks!  Catie Darnelle Maffucci, PharmD

## 2017-11-30 NOTE — Progress Notes (Signed)
    S:     Chief Complaint  Patient presents with  . Medication Management    Medication Assistance; Hypertension    Patient arrives in good spirits, ambulating without assistance.  He was referred for Medication Assistance by PCP Dr. [name] on 11/20/2017. Patient notes difficulty affording Victoza and Lantus. Patient denies s/sx hypoglycemia.   He does note increased lower back pain over the past few weeks, having to take 1 hydrocodone each evening.  He also notes that he has had elevated BP readings at home, sometimes up to systolics of 349Z. He notes that ~3 times, he has tried taking losartan 25 mg twice daily, as well as doxazosin 4 mg at bedtime, and notes improved BP results <140/80. He denies dizziness/lightheadedness as signs of hypotension when he did trial doubling losartan.   O:  Lab Results  Component Value Date   HGBA1C 6.2 (H) 11/20/2017   Vitals:   11/30/17 1547  BP: 130/77  Pulse: 96  SpO2: 96%    Lipid Panel     Component Value Date/Time   CHOL 105 08/19/2017 1005   TRIG 154.0 (H) 08/19/2017 1005   HDL 39.50 08/19/2017 1005   CHOLHDL 3 08/19/2017 1005   VLDL 30.8 08/19/2017 1005   LDLCALC 34 08/19/2017 1005    BMP Latest Ref Rng & Units 09/10/2017 08/19/2017 07/06/2017  Glucose 65 - 99 mg/dL 159(H) 144(H) 140(H)  BUN 6 - 20 mg/dL 14 14 14   Creatinine 0.61 - 1.24 mg/dL 1.50(H) 1.52(H) 1.17  Sodium 135 - 145 mmol/L 136 138 139  Potassium 3.5 - 5.1 mmol/L 3.7 4.2 3.9  Chloride 101 - 111 mmol/L 99(L) 98 102  CO2 22 - 32 mmol/L 27 33(H) 29  Calcium 8.9 - 10.3 mg/dL 9.0 9.4 9.4   Home BP Range:  - Systolic 79-150; diastolic 56-97X   A/P: Following discussion and approval by Dr. [name], the following medication changes were made:   #Diabetes longstanding currently controlled. Patient denies hypoglycemic events. Patient reports adherence with medication. Patient out of pocket spend qualifies for Victoza and Lantus patient assistance - Completed  Victoza (NovoNordisk) and Lantus (Sanofi) patient assistance applications. Will f/u with patient assistance companies in 5-7 business days - Next A1C anticipated 02/2018.    #Hypertension longstanding currently at goal, though often elevated at home per patient report.  Patient reports adherence with medication, though increased losartan to 25 mg BID a few times. - Trial losartan 25 mg BID; continue doxazosin 4 mg HS.  - Scheduled BMP in 7-10 days to evaluate Scr, K with increased ACEi dose  Written patient instructions provided.  Total time in face to face counseling 45 minutes.    Follow up in Pharmacist Clinic Visit PRN.   De Hollingshead, PharmD PGY2 Ambulatory Care Pharmacy Resident Phone: [phone]

## 2017-12-01 ENCOUNTER — Other Ambulatory Visit: Payer: Self-pay

## 2017-12-01 DIAGNOSIS — E1142 Type 2 diabetes mellitus with diabetic polyneuropathy: Secondary | ICD-10-CM

## 2017-12-01 NOTE — Progress Notes (Addendum)
  [name], MD Phone: [phone]  Douglas [name] is a 72 y.o. male who presents today for follow-up.  I have also reviewed the clinical pharmacists documentation and agree with her management.  I saw the patient with her.  CC: Chronic low back pain  He notes recently as low back pain has been bothering him more.  Hurts with movement.  Does radiate down both of his legs.  No bowel or bladder incontinence.  No saddle anesthesia.  No numbness or weakness.  It has been sometime since he has had any imaging.  Social History   Tobacco Use  Smoking Status Never Smoker  Smokeless Tobacco Never Used     ROS see history of present illness  Objective  Physical Exam Vitals:   11/30/17 1547  BP: 130/77  Pulse: 96  SpO2: 96%    BP Readings from Last 3 Encounters:  11/30/17 130/77  11/25/17 (!) 168/93  11/20/17 138/84   Wt Readings from Last 3 Encounters:  11/30/17 262 lb 3.2 oz (118.9 kg)  11/20/17 263 lb (119.3 kg)  11/19/17 262 lb 8 oz (119.1 kg)    Physical Exam  Constitutional: No distress.  Pulmonary/Chest: Effort normal.  Musculoskeletal:  No midline spine tenderness, no midline spine step-off, no muscular back tenderness, 5/5 strength bilateral quads, hamstrings, plantar flexion, and dorsiflexion, sensation light touch intact bilateral lower extremities  Skin: He is not diaphoretic.     Assessment/Plan: Please see individual problem list.  Chronic low back pain: Patient with worsening chronic low back pain now with radicular symptoms into both of his legs.  We will see about getting an MRI lumbar spine scheduled to go along with his MRI cervical spine that is scheduled for this coming weekend.   Orders Placed This Encounter  Procedures  . Basic Metabolic Panel (BMET)    Standing Status:   Future    Standing Expiration Date:   12/01/2018    Meds ordered this encounter  Medications  . losartan (COZAAR) 25 MG tablet    Sig: Take 1 tablet (25 mg  total) by mouth 2 (two) times daily.    Dispense:  60 tablet    Refill:  2    Dose increase.     [name], MD [place]

## 2017-12-01 NOTE — Addendum Note (Signed)
Addended by: [name] on: 12/01/2017 05:08 PM   Modules accepted: Orders, Level of Service

## 2017-12-02 NOTE — Progress Notes (Signed)
Not able to get both scheduled on 8/25. L spine has been scheduled on 9/4.

## 2017-12-04 ENCOUNTER — Telehealth: Payer: Self-pay | Admitting: Pharmacist

## 2017-12-04 NOTE — Telephone Encounter (Signed)
Contacted NovoNordisk Patient Assistance - patient has been approved for Victoza patient assistance through 03/13/2018. Should ship within 7-10 business days to the office.   Contacted Sanofi Patient Assistance - patient has been approved for Lantus patient assistance through 04/01/2018. Should ship within 3-5 business days to the office.   Contacted patient to inform him of approvals. Notified him that Thurmond Butts, CMA would contact him when the medications arrive. Advised patient to reach out if medication assistance is needed in the future.   Plan:  - Route to Thurmond Butts, CMA for notification.   Catie Darnelle Maffucci, PharmD PGY2 Ambulatory Care Pharmacy Resident Phone: [phone]

## 2017-12-06 ENCOUNTER — Ambulatory Visit
Admission: RE | Admit: 2017-12-06 | Discharge: 2017-12-06 | Disposition: A | Discharge status: Home / Self Care | Payer: Medicare Other | Source: Ambulatory Visit | Attending: Family Medicine | Admitting: Family Medicine

## 2017-12-06 DIAGNOSIS — M4802 Spinal stenosis, cervical region: Secondary | ICD-10-CM | POA: Diagnosis not present

## 2017-12-06 DIAGNOSIS — M4722 Other spondylosis with radiculopathy, cervical region: Secondary | ICD-10-CM | POA: Diagnosis not present

## 2017-12-06 DIAGNOSIS — M542 Cervicalgia: Secondary | ICD-10-CM | POA: Diagnosis not present

## 2017-12-06 DIAGNOSIS — M5412 Radiculopathy, cervical region: Secondary | ICD-10-CM | POA: Diagnosis present

## 2017-12-07 ENCOUNTER — Other Ambulatory Visit: Payer: Medicare Other

## 2017-12-10 ENCOUNTER — Encounter: Payer: Self-pay | Admitting: Family Medicine

## 2017-12-10 ENCOUNTER — Other Ambulatory Visit: Payer: Self-pay | Admitting: Family Medicine

## 2017-12-10 ENCOUNTER — Ambulatory Visit (INDEPENDENT_AMBULATORY_CARE_PROVIDER_SITE_OTHER): Payer: Medicare Other | Admitting: Family Medicine

## 2017-12-10 VITALS — BP 116/78 | HR 82 | Temp 98.5°F | Ht 72.0 in | Wt 260.4 lb

## 2017-12-10 DIAGNOSIS — W57XXXA Bitten or stung by nonvenomous insect and other nonvenomous arthropods, initial encounter: Secondary | ICD-10-CM

## 2017-12-10 DIAGNOSIS — A770 Spotted fever due to Rickettsia rickettsii: Secondary | ICD-10-CM

## 2017-12-10 DIAGNOSIS — S30860A Insect bite (nonvenomous) of lower back and pelvis, initial encounter: Secondary | ICD-10-CM

## 2017-12-10 DIAGNOSIS — E1142 Type 2 diabetes mellitus with diabetic polyneuropathy: Secondary | ICD-10-CM | POA: Diagnosis not present

## 2017-12-10 DIAGNOSIS — M5412 Radiculopathy, cervical region: Secondary | ICD-10-CM

## 2017-12-10 DIAGNOSIS — R21 Rash and other nonspecific skin eruption: Secondary | ICD-10-CM | POA: Diagnosis not present

## 2017-12-10 HISTORY — DX: Spotted fever due to Rickettsia rickettsii: A77.0

## 2017-12-10 NOTE — Progress Notes (Signed)
Subjective:    Patient ID: Douglas [name], male    DOB: [birth year], 72 y.o.   MRN: [id number]  HPI  Presents to clinic for follow up on tick bite.  Patient initially came to clinic on 11/20/2017 due to take bite on back.  States he believed tech had been on him for approximately a week, and tick was engorged.  Work to check for [employer] spotted fever had a positive IgG result, patient was started on course of doxycycline to treat advised he must take entire course, and be fever free for at least 3 days before stopping the doxycycline.  CMA spoke with the patient yesterday, and overall he is feeling better but did develop a rash under ABD fold.  Patient states he has not had fever in over a week.  Feels the tick bite areas on back have begun to heal and are only mildly itchy.  States he did have a headache last week, but feels better today.  Patient did follow-up with podiatry due to pain in feet -states he was told he does not have an ingrown toenails, but the pain in toes is related to diabetic peripheral neuropathy.  A1c checked on 11/20/2016 is 6.2%, and patient states ever since starting Lyrica his peripheral neuropathy the pain has been much improved.  Patient brought a list of blood sugar readings and all look very good, no readings over 200.  List will be scanned into chart.  Patient Active Problem List   Diagnosis Date Noted  . Cervical radiculopathy 11/20/2017  . Toe pain, bilateral 11/20/2017  . Tick bite of back 08/19/2017  . Abdominal pain 02/18/2017  . Clavicle enlargement 02/18/2017  . Anxiety 02/18/2017  . Degenerative arthritis of knee, bilateral 09/04/2016  . BPH (benign prostatic hyperplasia) 08/18/2016  . Knee osteoarthritis 08/18/2016  . Hyperlipidemia 05/20/2016  . Essential hypertension 05/31/2015  . Low testosterone 02/27/2015  . Diabetic neuropathy ([place]) 01/05/2013  . Obesity (BMI 30-39.9) 01/05/2013  . Chronic back pain greater than 3 months duration  01/05/2013  . Gout 01/05/2013  . OSA (obstructive sleep apnea) 01/05/2013  . Chronic kidney disease 01/04/2013  . DM type 2 with diabetic peripheral neuropathy ([place]) 01/04/2013  . Hypothyroidism 01/04/2013   Social History   Tobacco Use  . Smoking status: Never Smoker  . Smokeless tobacco: Never Used  Substance Use Topics  . Alcohol use: No   Review of Systems   Constitutional: Negative for chills, fatigue and fever.  HENT: Negative for congestion, ear pain, sinus pain and sore throat.   Eyes: Negative.   Respiratory: Negative for cough, shortness of breath and wheezing.   Cardiovascular: Negative for chest pain, palpitations and leg swelling.  Gastrointestinal: Negative for abdominal pain, diarrhea, nausea and vomiting.  Genitourinary: Negative for dysuria, frequency and urgency.  Musculoskeletal: Negative for arthralgias and myalgias.  Skin: tick bite areas improved, small rash under ABD fold Neurological: Negative for syncope, light-headedness and headaches.  Psychiatric/Behavioral: The patient is not nervous/anxious.       Objective:   Physical Exam  Constitutional: He is oriented to person, place, and time. He appears well-developed and well-nourished. No distress.  Cardiovascular: Normal rate and regular rhythm.  Pulmonary/Chest: Effort normal and breath sounds normal. No respiratory distress. He has no wheezes. He has no rales.  Neurological: He is alert and oriented to person, place, and time.  Skin: Skin is warm and dry. No pallor.  Mild redness under ABD fold, consistent with a fungal rash  2 small circular areas on back, each about diameter of pea, on low back appear faint pink in color - patient states they are from the tick bites; look to be healing well.  Psychiatric: He has a normal mood and affect. His behavior is normal.  Nursing note and vitals reviewed.   Vitals:   12/10/17 1058  BP: 116/78  Pulse: 82  Temp: 98.5 F (36.9 C)  SpO2: 98%         Assessment & Plan:    Tick bite/RMSF -- Patient has 3 days supply (6 tablet) left of doxycycline, he has been advised to complete the final 3 days.  Fever has resolved.  Patient has no other residual symptoms from tick bite other than minor headache off and on.  Patient states he and wife both check each other regularly for ticks.  Diabetes with peripheral neuropathy --diabetes under good control, A1c 6.2%.  Neuropathy pain improved since patient has started taking the Lyrica.  He has just seen podiatry and no ingrown toenails were found.  Patient will continue to keep regular log of his blood sugar readings as he has been doing.  Rash -- minor fungal rash underneath abdominal fold.  Offered to send in prescription for a antifungal powder, but patient states he has this at home and will use to clear up the rash.  Advised to let us know if rash does not improve and we can send in a prescription antifungal powder or cream.  Keep follow-up in November 2019 as planned.  Follow-up in clinic sooner if needed.

## 2017-12-10 NOTE — Patient Instructions (Signed)
Great to meet you!  A1c 11/20/17 was 6.2%, great diabetes control

## 2017-12-11 ENCOUNTER — Encounter: Payer: Self-pay | Admitting: Family Medicine

## 2017-12-16 ENCOUNTER — Ambulatory Visit
Admission: RE | Admit: 2017-12-16 | Discharge: 2017-12-16 | Disposition: A | Discharge status: Home / Self Care | Payer: Medicare Other | Source: Ambulatory Visit | Attending: Family Medicine | Admitting: Family Medicine

## 2017-12-16 ENCOUNTER — Telehealth: Payer: Self-pay | Admitting: *Deleted

## 2017-12-16 DIAGNOSIS — M549 Dorsalgia, unspecified: Secondary | ICD-10-CM

## 2017-12-16 DIAGNOSIS — M48061 Spinal stenosis, lumbar region without neurogenic claudication: Secondary | ICD-10-CM | POA: Insufficient documentation

## 2017-12-16 DIAGNOSIS — G8929 Other chronic pain: Secondary | ICD-10-CM | POA: Diagnosis not present

## 2017-12-16 DIAGNOSIS — M2578 Osteophyte, vertebrae: Secondary | ICD-10-CM | POA: Insufficient documentation

## 2017-12-16 DIAGNOSIS — M5126 Other intervertebral disc displacement, lumbar region: Secondary | ICD-10-CM | POA: Insufficient documentation

## 2017-12-16 DIAGNOSIS — M47816 Spondylosis without myelopathy or radiculopathy, lumbar region: Secondary | ICD-10-CM | POA: Insufficient documentation

## 2017-12-16 DIAGNOSIS — M545 Low back pain: Secondary | ICD-10-CM | POA: Diagnosis not present

## 2017-12-16 NOTE — Telephone Encounter (Signed)
Left pt a voicemail to inform him that his medication from the patient assistance program has been received & placed in the fridge. It is ready for pick up.

## 2017-12-17 ENCOUNTER — Other Ambulatory Visit: Payer: Self-pay | Admitting: Internal Medicine

## 2017-12-17 DIAGNOSIS — R937 Abnormal findings on diagnostic imaging of other parts of musculoskeletal system: Secondary | ICD-10-CM

## 2017-12-17 DIAGNOSIS — G8929 Other chronic pain: Secondary | ICD-10-CM

## 2017-12-17 DIAGNOSIS — M545 Low back pain: Principal | ICD-10-CM

## 2017-12-17 DIAGNOSIS — M48061 Spinal stenosis, lumbar region without neurogenic claudication: Secondary | ICD-10-CM

## 2017-12-18 NOTE — Telephone Encounter (Signed)
Left another message to let patient know that his medication were here & ready for pickup.

## 2017-12-24 DIAGNOSIS — M25512 Pain in left shoulder: Secondary | ICD-10-CM | POA: Diagnosis not present

## 2017-12-24 DIAGNOSIS — M545 Low back pain: Secondary | ICD-10-CM | POA: Diagnosis not present

## 2017-12-24 DIAGNOSIS — G8929 Other chronic pain: Secondary | ICD-10-CM | POA: Diagnosis not present

## 2017-12-24 DIAGNOSIS — M542 Cervicalgia: Secondary | ICD-10-CM | POA: Diagnosis not present

## 2017-12-27 ENCOUNTER — Other Ambulatory Visit: Payer: Self-pay | Admitting: Family Medicine

## 2017-12-28 NOTE — Telephone Encounter (Signed)
Pt called

## 2017-12-31 ENCOUNTER — Telehealth: Payer: Self-pay | Admitting: Family Medicine

## 2017-12-31 NOTE — Telephone Encounter (Signed)
I have not received anything requesting this. It is ok to provide the expiration date. Thanks.

## 2017-12-31 NOTE — Telephone Encounter (Signed)
I am not aware of this ok to give DEA expiration date?

## 2017-12-31 NOTE — Telephone Encounter (Signed)
I do not have the expiration date just your DEA number

## 2017-12-31 NOTE — Telephone Encounter (Signed)
Copied from [place] [phone]. Topic: Quick Communication - See Telephone Encounter >> Dec 31, 2017 12:34 PM Ivar Drape wrote: CRM for notification. See Telephone encounter for: 12/31/17. Joelene Millin w/[facility] [phone] is requesting a letter stating Dr. Ellen Henri DEA# expiration date.  They already has his DEA number.  They are requesting the expiration date in written form.  They said they have been requesting this since May 2019.  This letter can be faxed to them at [phone].

## 2018-01-01 NOTE — Telephone Encounter (Signed)
Faxed a letter to [employer] with Dea expiration date.

## 2018-01-01 NOTE — Telephone Encounter (Signed)
The expiration date is 06/11/20.

## 2018-01-11 ENCOUNTER — Other Ambulatory Visit: Payer: Self-pay | Admitting: Family Medicine

## 2018-01-11 ENCOUNTER — Telehealth: Payer: Self-pay

## 2018-01-11 NOTE — Telephone Encounter (Signed)
I spoke with Juliann Pulse regarding this. My DEA number is not expired and the actual number did not change. The expiration date listed below should be accurate. Please call and confirm exactly what they are missing and need. Thanks.

## 2018-01-11 NOTE — Telephone Encounter (Signed)
Tanisha from [employer] called about medication for Lyrica.  They are stating that the [facility] number expired for Dr [name].  They need to have new DEA number and expiration date  faxed in on letterhead to [phone].  Attn Dominico Kimrey and pt number [id number] Phone [phone]  Yvetta Coder said they had not received this information yet.

## 2018-01-11 NOTE — Telephone Encounter (Signed)
Copied from [place] [id number]. Topic: General - Other >> Jan 11, 2018  2:41 PM Janace Aris A wrote: Reason for CRM: patient called today in regards to his medication LYRICA 150 MG capsule, says he has not received this at the pharmacy as of yet, and the pharmacy is saying they need to speak with the nurse or doctor to fill this order at the pharmacy on file.    Patient says he would like a call from someone when this order gets filled.

## 2018-01-11 NOTE — Telephone Encounter (Signed)
Please advise on expired DEA number

## 2018-01-12 NOTE — Telephone Encounter (Signed)
Rx has been called into pt's pharmacy.  

## 2018-01-12 NOTE — Telephone Encounter (Signed)
I have faxed a second letter to [employer] with PCP DEA and expiration date. For patient Lyrica. Treied to notify patient no answer left voicemail to call office. PEC may advise patient all information has been given to patient assistance program at [employer] for his Lyrica.

## 2018-01-12 NOTE — Telephone Encounter (Signed)
Rx has been called into [facility]

## 2018-01-13 DIAGNOSIS — E11610 Type 2 diabetes mellitus with diabetic neuropathic arthropathy: Secondary | ICD-10-CM | POA: Diagnosis not present

## 2018-01-13 DIAGNOSIS — M48061 Spinal stenosis, lumbar region without neurogenic claudication: Secondary | ICD-10-CM | POA: Diagnosis not present

## 2018-01-13 DIAGNOSIS — M5416 Radiculopathy, lumbar region: Secondary | ICD-10-CM | POA: Diagnosis not present

## 2018-01-13 DIAGNOSIS — M47812 Spondylosis without myelopathy or radiculopathy, cervical region: Secondary | ICD-10-CM | POA: Diagnosis not present

## 2018-01-13 NOTE — Telephone Encounter (Signed)
Last OV 12/10/2017   Last refilled

## 2018-01-26 ENCOUNTER — Other Ambulatory Visit: Payer: Self-pay | Admitting: Family Medicine

## 2018-01-26 DIAGNOSIS — M48061 Spinal stenosis, lumbar region without neurogenic claudication: Secondary | ICD-10-CM | POA: Diagnosis not present

## 2018-02-04 ENCOUNTER — Ambulatory Visit: Payer: Medicare Other | Admitting: Student in an Organized Health Care Education/Training Program

## 2018-02-11 DIAGNOSIS — D631 Anemia in chronic kidney disease: Secondary | ICD-10-CM | POA: Diagnosis not present

## 2018-02-11 DIAGNOSIS — N183 Chronic kidney disease, stage 3 (moderate): Secondary | ICD-10-CM | POA: Diagnosis not present

## 2018-02-11 DIAGNOSIS — I10 Essential (primary) hypertension: Secondary | ICD-10-CM | POA: Diagnosis not present

## 2018-02-11 DIAGNOSIS — N2581 Secondary hyperparathyroidism of renal origin: Secondary | ICD-10-CM | POA: Diagnosis not present

## 2018-02-11 DIAGNOSIS — R600 Localized edema: Secondary | ICD-10-CM | POA: Diagnosis not present

## 2018-02-26 DIAGNOSIS — E11610 Type 2 diabetes mellitus with diabetic neuropathic arthropathy: Secondary | ICD-10-CM | POA: Diagnosis not present

## 2018-02-26 DIAGNOSIS — M48061 Spinal stenosis, lumbar region without neurogenic claudication: Secondary | ICD-10-CM | POA: Diagnosis not present

## 2018-02-26 DIAGNOSIS — I10 Essential (primary) hypertension: Secondary | ICD-10-CM | POA: Diagnosis not present

## 2018-02-26 DIAGNOSIS — Z6834 Body mass index (BMI) 34.0-34.9, adult: Secondary | ICD-10-CM | POA: Diagnosis not present

## 2018-03-01 ENCOUNTER — Ambulatory Visit (INDEPENDENT_AMBULATORY_CARE_PROVIDER_SITE_OTHER): Payer: Medicare Other | Admitting: Family Medicine

## 2018-03-01 ENCOUNTER — Encounter: Payer: Self-pay | Admitting: Family Medicine

## 2018-03-01 VITALS — BP 148/90 | HR 87 | Temp 98.1°F | Ht 72.0 in | Wt 253.2 lb

## 2018-03-01 DIAGNOSIS — E1142 Type 2 diabetes mellitus with diabetic polyneuropathy: Secondary | ICD-10-CM

## 2018-03-01 DIAGNOSIS — I10 Essential (primary) hypertension: Secondary | ICD-10-CM | POA: Diagnosis not present

## 2018-03-01 DIAGNOSIS — E11610 Type 2 diabetes mellitus with diabetic neuropathic arthropathy: Secondary | ICD-10-CM | POA: Diagnosis not present

## 2018-03-01 DIAGNOSIS — E039 Hypothyroidism, unspecified: Secondary | ICD-10-CM | POA: Diagnosis not present

## 2018-03-01 DIAGNOSIS — M47812 Spondylosis without myelopathy or radiculopathy, cervical region: Secondary | ICD-10-CM | POA: Diagnosis not present

## 2018-03-01 DIAGNOSIS — Z23 Encounter for immunization: Secondary | ICD-10-CM

## 2018-03-01 LAB — TSH: TSH: 1.31 u[IU]/mL (ref 0.35–4.50)

## 2018-03-01 LAB — HEMOGLOBIN A1C: Hgb A1c MFr Bld: 6.4 % (ref 4.6–6.5)

## 2018-03-01 LAB — BASIC METABOLIC PANEL
BUN: 14 mg/dL (ref 6–23)
CO2: 33 mEq/L — ABNORMAL HIGH (ref 19–32)
Calcium: 9.5 mg/dL (ref 8.4–10.5)
Chloride: 102 mEq/L (ref 96–112)
Creatinine, Ser: 1.32 mg/dL (ref 0.40–1.50)
GFR: 56.55 mL/min — ABNORMAL LOW (ref >60.00)
Glucose, Bld: 113 mg/dL — ABNORMAL HIGH (ref 70–99)
Potassium: 4.1 mEq/L (ref 3.5–5.1)
Sodium: 141 mEq/L (ref 135–145)

## 2018-03-01 MED ORDER — HYDROCODONE-ACETAMINOPHEN 5-325 MG PO TABS: 1.0000 | ORAL_TABLET | Freq: Four times a day (QID) | ORAL | 0 refills | Status: DC | PRN

## 2018-03-01 MED ORDER — TETANUS-DIPHTH-ACELL PERTUSSIS 5-2.5-18.5 LF-MCG/0.5 IM SUSP: 0.5000 mL | Freq: Once | INTRAMUSCULAR | 0 refills | Status: AC

## 2018-03-01 NOTE — Patient Instructions (Signed)
Nice to see you. I have sent in a refill of your Vicodin. We will get lab work today and contact you with the results.

## 2018-03-02 ENCOUNTER — Encounter: Payer: Self-pay | Admitting: Family Medicine

## 2018-03-02 NOTE — Assessment & Plan Note (Signed)
Decently well controlled with his current regimen.  Rarely uses hydrocodone.  Refill given.  Controlled substance database reviewed.

## 2018-03-02 NOTE — Progress Notes (Signed)
  [name], MD Phone: [phone]  Douglas [name] is a 72 y.o. male who presents today for follow-up.  CC: Diabetes, hypertension, chronic pain  Diabetes: Typically less than 150.  Taking Lantus 40 units nightly and Victoza.  No polyuria or polydipsia.  No hypoglycemia.  Hypertension: Runs 92-157/64-86.  Mostly less than 568 systolically.  Taking losartan and Lasix.  No chest pain or shortness of breath.  He has chronic edema that is unchanged.  He went to taking losartan once daily as he was getting lightheaded with the morning dose.  That is resolved.  Chronic pain: He notes he is going to get an injection in his neck.  He had an injection in his back which was beneficial.  He notes neuropathic pain in his bilateral great toes.  He occasionally will take a Vicodin for this.  No drowsiness.  No alcohol intake.  Social History   Tobacco Use  Smoking Status Never Smoker  Smokeless Tobacco Never Used     ROS see history of present illness  Objective  Physical Exam Vitals:   03/01/18 1318  BP: (!) 148/90  Pulse: 87  Temp: 98.1 F (36.7 C)  SpO2: 97%    BP Readings from Last 3 Encounters:  03/01/18 (!) 148/90  12/10/17 116/78  11/30/17 130/77   Wt Readings from Last 3 Encounters:  03/01/18 253 lb 3.2 oz (114.9 kg)  12/10/17 260 lb 6.4 oz (118.1 kg)  11/30/17 262 lb 3.2 oz (118.9 kg)    Physical Exam  Constitutional: No distress.  Cardiovascular: Normal rate, regular rhythm and normal heart sounds.  Pulmonary/Chest: Effort normal and breath sounds normal.  Musculoskeletal: He exhibits no edema.  Neurological: He is alert.  Skin: Skin is warm and dry. He is not diaphoretic.     Assessment/Plan: Please see individual problem list.  Essential hypertension Blood pressure is adequately controlled at home.  He will continue his current regimen.  DM type 2 with diabetic peripheral neuropathy (HCC) Seems to be fairly well controlled.  Check A1c.  Continue  current regimen.  Hypothyroidism Check TSH.  Diabetic neuropathy ([place]) Decently well controlled with his current regimen.  Rarely uses hydrocodone.  Refill given.  Controlled substance database reviewed.   Health Maintenance: Flu vaccination given.  Prescription for tetanus vaccination given.  Orders Placed This Encounter  Procedures  . Flu vaccine HIGH DOSE PF (Fluzone High dose)  . Basic Metabolic Panel (BMET)  . HgB A1c  . TSH    Meds ordered this encounter  Medications  . HYDROcodone-acetaminophen (NORCO/VICODIN) 5-325 MG tablet    Sig: Take 1 tablet by mouth every 6 (six) hours as needed for moderate pain.    Dispense:  30 tablet    Refill:  0  . Tdap (BOOSTRIX) 5-2.5-18.5 LF-MCG/0.5 injection    Sig: Inject 0.5 mLs into the muscle once for 1 dose.    Dispense:  0.5 mL    Refill:  0     [name], MD [place]

## 2018-03-02 NOTE — Assessment & Plan Note (Signed)
Check TSH 

## 2018-03-02 NOTE — Assessment & Plan Note (Signed)
Seems to be fairly well controlled.  Check A1c.  Continue current regimen.

## 2018-03-02 NOTE — Assessment & Plan Note (Signed)
Blood pressure is adequately controlled at home.  He will continue his current regimen.

## 2018-03-09 ENCOUNTER — Other Ambulatory Visit: Payer: Self-pay | Admitting: Family Medicine

## 2018-03-09 DIAGNOSIS — E1142 Type 2 diabetes mellitus with diabetic polyneuropathy: Secondary | ICD-10-CM

## 2018-03-15 ENCOUNTER — Telehealth: Payer: Self-pay | Admitting: Pharmacist

## 2018-03-15 NOTE — Telephone Encounter (Signed)
Called main number and patient was not home was given his wife's phone number called the patient wife and left a detailed VM will try and call patient again tomorrow.

## 2018-03-15 NOTE — Telephone Encounter (Addendum)
Lantus 100 units/mL, 10 pens delivered through [place] patient assistance to clinic.   Will route to [facility], [place] to notify Mr. Hershey that this medication is available for pick up. Medication is in the refrigerator in Pod A in the drawer labeled "patient assistance"  Catie Darnelle Maffucci, PharmD PGY2 Ambulatory Care Pharmacy Resident, [place] Phone: [phone]

## 2018-03-16 NOTE — Telephone Encounter (Signed)
Called and spoke with patient. Pt advised and voiced understanding. He stated that his wife will come by to pick this up. Pt asked to have his lab results mailed to him. Results have been placed to be mailed to pt.

## 2018-04-02 ENCOUNTER — Other Ambulatory Visit: Payer: Self-pay | Admitting: Family Medicine

## 2018-04-03 ENCOUNTER — Other Ambulatory Visit: Payer: Self-pay | Admitting: Family Medicine

## 2018-04-03 DIAGNOSIS — E118 Type 2 diabetes mellitus with unspecified complications: Secondary | ICD-10-CM

## 2018-04-19 DIAGNOSIS — M48061 Spinal stenosis, lumbar region without neurogenic claudication: Secondary | ICD-10-CM | POA: Diagnosis not present

## 2018-04-19 DIAGNOSIS — E11610 Type 2 diabetes mellitus with diabetic neuropathic arthropathy: Secondary | ICD-10-CM | POA: Diagnosis not present

## 2018-04-19 DIAGNOSIS — M47812 Spondylosis without myelopathy or radiculopathy, cervical region: Secondary | ICD-10-CM | POA: Diagnosis not present

## 2018-04-19 DIAGNOSIS — Z6835 Body mass index (BMI) 35.0-35.9, adult: Secondary | ICD-10-CM | POA: Diagnosis not present

## 2018-04-23 ENCOUNTER — Other Ambulatory Visit: Payer: Self-pay | Admitting: Family Medicine

## 2018-04-23 DIAGNOSIS — E1142 Type 2 diabetes mellitus with diabetic polyneuropathy: Secondary | ICD-10-CM

## 2018-04-23 NOTE — Telephone Encounter (Signed)
Copied from [place] [id number]. Topic: Quick Communication - Rx Refill/Question >> Apr 23, 2018  3:36 PM Ahmed Prima L wrote: Medication: liraglutide (VICTOZA) 18 MG/3ML SOPN  Has the patient contacted their pharmacy? Yes (Agent: If no, request that the patient contact the pharmacy for the refill.) (Agent: If yes, when and what did the pharmacy advise?)  Preferred Pharmacy (with phone number or street name): [id number] [address], [place] [address]    Agent: Please be advised that RX refills may take up to 3 business days. We ask that you follow-up with your pharmacy.

## 2018-04-23 NOTE — Telephone Encounter (Signed)
Requested medication (s) are due for refill today: yes  Requested medication (s) are on the active medication list: yes  Last refill:  09/28/17 for 3 pens and 3 refills  Future visit scheduled: yes  Notes to clinic:  Pt is requesting med sent to [id number]  Requested Prescriptions  Pending Prescriptions Disp Refills   liraglutide (VICTOZA) 18 MG/3ML SOPN 3 pen 3    Sig: Inject 1.2 - 1.8 mg under the skin once a day.     Endocrinology:  Diabetes - GLP-1 Receptor Agonists Passed - 04/23/2018  3:40 PM      Passed - HBA1C is between 0 and 7.9 and within 180 days    Hemoglobin A1C  Date Value Ref Range Status  12/04/2012 10.2 (H) 4.2 - 6.3 % Final    Comment:    The American Diabetes Association recommends that a primary goal of therapy should be <7% and that physicians should reevaluate the treatment regimen in patients with HbA1c values consistently >8%.    Hgb A1c MFr Bld  Date Value Ref Range Status  03/01/2018 6.4 4.6 - 6.5 % Final    Comment:    Glycemic Control Guidelines for People with Diabetes:Non Diabetic:  <6%Goal of Therapy: <7%Additional Action Suggested:  >8%          Passed - Valid encounter within last 6 months    Recent Outpatient Visits          1 month ago Essential hypertension   [place], [name], MD   4 months ago Tick bite of back, initial encounter   [place], Jacquelynn Cree, FNP   4 months ago Essential hypertension   [facility] De Hollingshead, [place]   5 months ago Tick bite of back, initial encounter   [facility] [name], MD   6 months ago DM type 2 with diabetic peripheral neuropathy [facility])   [place], Johny Dr[place], [facility]      Future Appointments            In 2 months [name], [name], MD [facility], [place]   In 2 months [place], [place], [place],  [place]   In 5 months [place], Ronda Fairly, [place]

## 2018-05-03 MED ORDER — LIRAGLUTIDE 18 MG/3ML ~~LOC~~ SOPN: PEN_INJECTOR | SUBCUTANEOUS | 3 refills | Status: DC

## 2018-05-10 ENCOUNTER — Other Ambulatory Visit: Payer: Self-pay | Admitting: Family Medicine

## 2018-06-02 ENCOUNTER — Other Ambulatory Visit: Payer: Self-pay | Admitting: Family Medicine

## 2018-06-02 DIAGNOSIS — E1142 Type 2 diabetes mellitus with diabetic polyneuropathy: Secondary | ICD-10-CM

## 2018-06-03 DIAGNOSIS — E11610 Type 2 diabetes mellitus with diabetic neuropathic arthropathy: Secondary | ICD-10-CM | POA: Diagnosis not present

## 2018-06-03 DIAGNOSIS — I10 Essential (primary) hypertension: Secondary | ICD-10-CM | POA: Diagnosis not present

## 2018-06-03 DIAGNOSIS — M47812 Spondylosis without myelopathy or radiculopathy, cervical region: Secondary | ICD-10-CM | POA: Diagnosis not present

## 2018-06-03 DIAGNOSIS — M48061 Spinal stenosis, lumbar region without neurogenic claudication: Secondary | ICD-10-CM | POA: Diagnosis not present

## 2018-06-03 DIAGNOSIS — Z6835 Body mass index (BMI) 35.0-35.9, adult: Secondary | ICD-10-CM | POA: Diagnosis not present

## 2018-06-03 NOTE — Telephone Encounter (Signed)
Sent to PCP for approval   Last OV 03/01/2018

## 2018-06-03 NOTE — Telephone Encounter (Signed)
Please call and confirm lantus dosing with the patient. The prescription was not correct. Once confirmed please contact the pharmacy with the corrected information. Thanks.

## 2018-06-03 NOTE — Telephone Encounter (Signed)
Please contact the patient and confirm his current dose of Lantus.  Thanks.

## 2018-06-04 ENCOUNTER — Other Ambulatory Visit: Payer: Self-pay

## 2018-06-04 DIAGNOSIS — E1142 Type 2 diabetes mellitus with diabetic polyneuropathy: Secondary | ICD-10-CM

## 2018-06-04 MED ORDER — INSULIN GLARGINE 100 UNIT/ML SOLOSTAR PEN: PEN_INJECTOR | SUBCUTANEOUS | 11 refills | Status: DC

## 2018-06-17 DIAGNOSIS — N2581 Secondary hyperparathyroidism of renal origin: Secondary | ICD-10-CM | POA: Diagnosis not present

## 2018-06-17 DIAGNOSIS — R600 Localized edema: Secondary | ICD-10-CM | POA: Diagnosis not present

## 2018-06-17 DIAGNOSIS — D631 Anemia in chronic kidney disease: Secondary | ICD-10-CM | POA: Diagnosis not present

## 2018-06-17 DIAGNOSIS — N183 Chronic kidney disease, stage 3 (moderate): Secondary | ICD-10-CM | POA: Diagnosis not present

## 2018-06-17 DIAGNOSIS — I10 Essential (primary) hypertension: Secondary | ICD-10-CM | POA: Diagnosis not present

## 2018-06-29 ENCOUNTER — Other Ambulatory Visit: Payer: Self-pay | Admitting: Family Medicine

## 2018-06-30 ENCOUNTER — Ambulatory Visit: Payer: Medicare Other | Admitting: Family Medicine

## 2018-07-01 ENCOUNTER — Encounter: Payer: Self-pay | Admitting: Family Medicine

## 2018-07-01 ENCOUNTER — Other Ambulatory Visit: Payer: Self-pay

## 2018-07-01 ENCOUNTER — Ambulatory Visit (INDEPENDENT_AMBULATORY_CARE_PROVIDER_SITE_OTHER): Payer: Medicare Other | Admitting: Family Medicine

## 2018-07-01 VITALS — BP 140/96 | HR 74 | Temp 98.4°F | Ht 70.0 in | Wt 262.6 lb

## 2018-07-01 DIAGNOSIS — M25512 Pain in left shoulder: Secondary | ICD-10-CM | POA: Diagnosis not present

## 2018-07-01 DIAGNOSIS — G4733 Obstructive sleep apnea (adult) (pediatric): Secondary | ICD-10-CM | POA: Diagnosis not present

## 2018-07-01 DIAGNOSIS — G8929 Other chronic pain: Secondary | ICD-10-CM | POA: Diagnosis not present

## 2018-07-01 DIAGNOSIS — J309 Allergic rhinitis, unspecified: Secondary | ICD-10-CM | POA: Diagnosis not present

## 2018-07-01 DIAGNOSIS — E1142 Type 2 diabetes mellitus with diabetic polyneuropathy: Secondary | ICD-10-CM | POA: Diagnosis not present

## 2018-07-01 DIAGNOSIS — M549 Dorsalgia, unspecified: Secondary | ICD-10-CM | POA: Diagnosis not present

## 2018-07-01 DIAGNOSIS — R7989 Other specified abnormal findings of blood chemistry: Secondary | ICD-10-CM

## 2018-07-01 DIAGNOSIS — I10 Essential (primary) hypertension: Secondary | ICD-10-CM

## 2018-07-01 LAB — BASIC METABOLIC PANEL
BUN: 13 mg/dL (ref 6–23)
CO2: 32 mEq/L (ref 19–32)
Calcium: 9.6 mg/dL (ref 8.4–10.5)
Chloride: 98 mEq/L (ref 96–112)
Creatinine, Ser: 1.35 mg/dL (ref 0.40–1.50)
GFR: 51.79 mL/min — ABNORMAL LOW (ref >60.00)
Glucose, Bld: 149 mg/dL — ABNORMAL HIGH (ref 70–99)
Potassium: 4.1 mEq/L (ref 3.5–5.1)
Sodium: 138 mEq/L (ref 135–145)

## 2018-07-01 LAB — HEMOGLOBIN A1C: Hgb A1c MFr Bld: 7.1 % — ABNORMAL HIGH (ref 4.6–6.5)

## 2018-07-01 NOTE — Patient Instructions (Signed)
Nice to see you. We will get lab work today and contact you with the results.  Once this returns we will consider what to do with your blood pressure medication. Please consider discussing your shoulder with sports medicine. If you would like to see pulmonology for your sleep apnea please let us know we can place a referral.

## 2018-07-01 NOTE — Progress Notes (Signed)
[name], MD Phone: [phone]  Douglas [name] is a 73 y.o. male who presents today for follow-up.  Chronic low back pain: Patient notes his back does not bother him very much.  He only occasionally takes a Vicodin and that is more for his neuropathy symptoms.  No numbness, weakness, incontinence, or saddle anesthesia.  The Cymbalta does help some with his back pain.  Hypertension: Typically 100-165/63-84.  He takes losartan at night.  No chest pain or shortness of breath.  He has chronic edema that goes down overnight.  No orthopnea or PND.  Diabetes: Typically running less than 140 though does have excursions to the 180s with carbohydrate intake.  Taking metformin and Lantus 48 units.  He notes polyuria and polydipsia chronically.  No hypoglycemia.  Neuropathy: Lyrica does help some with this.  He notes this occurs in his bilateral great toes.  He does see a pain specialist that is a neurosurgeon for this.  He notes keeping his feet warm helps.  He is tried Chief Operating Officer and clove oil.  He occasionally takes the Vicodin for this.  This does not make him drowsy.  He does not drink alcohol.  Hypogonadism: Patient notes he is following with urology for this.  He wonders if he should discuss his testosterone with urology and he also mentions that he has a history of his estrogen being too high which he was on tamoxifen through urology.  OSA: He is not using a CPAP.  He has tried it twice and could not tolerate this.  He notes he mostly wakes up well rested.  He notes no recent hypersomnia.  He is on nocturnal oxygen 2 L nightly.  Allergic rhinitis: Patient notes minimal allergy symptoms with rhinorrhea starting today.  He notes no fever or travel.  No other significant symptoms.  Left shoulder pain: Patient notes this has been going on for 10 months though he has had issues with this in the past as well.  He notes having had a surgery on his left shoulder.  He notes it mostly hurts when  he is propping the shoulder up.  Does not hurt to move the shoulder.  He notes he has not been evaluated for this in some time.  No specific injury.  Social History   Tobacco Use  Smoking Status Never Smoker  Smokeless Tobacco Never Used     ROS see history of present illness  Objective  Physical Exam Vitals:   07/01/18 1331  BP: (!) 140/96  Pulse: 74  Temp: 98.4 F (36.9 C)  SpO2: 98%    BP Readings from Last 3 Encounters:  07/01/18 (!) 140/96  03/01/18 (!) 148/90  12/10/17 116/78   Wt Readings from Last 3 Encounters:  07/01/18 262 lb 9.6 oz (119.1 kg)  03/01/18 253 lb 3.2 oz (114.9 kg)  12/10/17 260 lb 6.4 oz (118.1 kg)    Physical Exam Constitutional:      General: He is not in acute distress.    Appearance: He is not diaphoretic.  HENT:     Mouth/Throat:     Mouth: Mucous membranes are moist.     Pharynx: Oropharynx is clear.  Cardiovascular:     Rate and Rhythm: Normal rate and regular rhythm.     Heart sounds: Normal heart sounds.  Pulmonary:     Effort: Pulmonary effort is normal.     Breath sounds: Normal breath sounds.  Musculoskeletal:     Comments: Left shoulder with full range of  motion with no discomfort, no tenderness of the left shoulder  Skin:    General: Skin is warm and dry.  Neurological:     Mental Status: He is alert.      Assessment/Plan: Please see individual problem list.  Essential hypertension Not well controlled.  We will check lab work and then consider altering his losartan regimen.  OSA (obstructive sleep apnea) Patient declines using a CPAP.  Offered referral to pulmonology to consider a oral device though he declined this at this time.  DM type 2 with diabetic peripheral neuropathy (HCC) Check A1c.  Continue current regimen.  He will continue to see his specialist regarding his neuropathy pain.  Diabetic neuropathy ([place]) Relatively well-controlled with his current regimen of Lyrica and occasional use of Vicodin.   Controlled substance database reviewed.  We will refill his Vicodin.  Discussed risk of drowsiness.  Chronic back pain greater than 3 months duration No significant symptoms at this time.  He will continue to monitor.  Low testosterone We will request records from his prior urologist.  I advised him to discuss further treatment and evaluation with his current urologist.  Left shoulder pain Chronic issue.  Discussed potential x-ray though he deferred.  He notes he will be following up with sports medicine soon and he will discuss with them as well.  Allergic rhinitis Minimal symptoms.  He will monitor.   Orders Placed This Encounter  Procedures  . HgB A1c  . Basic Metabolic Panel (BMET)    Meds ordered this encounter  Medications  . HYDROcodone-acetaminophen (NORCO/VICODIN) 5-325 MG tablet    Sig: Take 1 tablet by mouth every 6 (six) hours as needed for moderate pain.    Dispense:  30 tablet    Refill:  0     [name], MD [place]

## 2018-07-02 ENCOUNTER — Other Ambulatory Visit: Payer: Self-pay | Admitting: Family Medicine

## 2018-07-02 DIAGNOSIS — J309 Allergic rhinitis, unspecified: Secondary | ICD-10-CM | POA: Insufficient documentation

## 2018-07-02 DIAGNOSIS — M25512 Pain in left shoulder: Secondary | ICD-10-CM | POA: Insufficient documentation

## 2018-07-02 MED ORDER — HYDROCODONE-ACETAMINOPHEN 5-325 MG PO TABS: 1.0000 | ORAL_TABLET | Freq: Four times a day (QID) | ORAL | 0 refills | Status: DC | PRN

## 2018-07-02 NOTE — Assessment & Plan Note (Signed)
No significant symptoms at this time.  He will continue to monitor.

## 2018-07-02 NOTE — Assessment & Plan Note (Signed)
We will request records from his prior urologist.  I advised him to discuss further treatment and evaluation with his current urologist.

## 2018-07-02 NOTE — Assessment & Plan Note (Signed)
Patient declines using a CPAP.  Offered referral to pulmonology to consider a oral device though he declined this at this time.

## 2018-07-02 NOTE — Assessment & Plan Note (Addendum)
Not well controlled.  We will check lab work and then consider altering his losartan regimen.

## 2018-07-02 NOTE — Assessment & Plan Note (Signed)
Check A1c.  Continue current regimen.  He will continue to see his specialist regarding his neuropathy pain.

## 2018-07-02 NOTE — Assessment & Plan Note (Addendum)
Relatively well-controlled with his current regimen of Lyrica and occasional use of Vicodin.  Controlled substance database reviewed.  We will refill his Vicodin.  Discussed risk of drowsiness.

## 2018-07-02 NOTE — Assessment & Plan Note (Signed)
Minimal symptoms.  He will monitor.

## 2018-07-02 NOTE — Assessment & Plan Note (Signed)
Chronic issue.  Discussed potential x-ray though he deferred.  He notes he will be following up with sports medicine soon and he will discuss with them as well.

## 2018-07-05 ENCOUNTER — Other Ambulatory Visit: Payer: Self-pay | Admitting: Family Medicine

## 2018-07-06 ENCOUNTER — Ambulatory Visit: Payer: Medicare Other

## 2018-07-12 ENCOUNTER — Encounter: Payer: Self-pay | Admitting: Family Medicine

## 2018-07-12 NOTE — Telephone Encounter (Signed)
error 

## 2018-07-15 ENCOUNTER — Ambulatory Visit: Payer: Medicare Other

## 2018-07-15 ENCOUNTER — Other Ambulatory Visit: Payer: Medicare Other

## 2018-07-19 ENCOUNTER — Telehealth: Payer: Self-pay | Admitting: *Deleted

## 2018-07-19 NOTE — Telephone Encounter (Signed)
Please place future lab orders for 07/21/18 appt. Thanks

## 2018-07-20 ENCOUNTER — Other Ambulatory Visit: Payer: Self-pay

## 2018-07-20 DIAGNOSIS — N401 Enlarged prostate with lower urinary tract symptoms: Secondary | ICD-10-CM

## 2018-07-20 DIAGNOSIS — R3912 Poor urinary stream: Principal | ICD-10-CM

## 2018-07-20 MED ORDER — FINASTERIDE 5 MG PO TABS: 5.0000 mg | ORAL_TABLET | Freq: Every day | ORAL | 0 refills | Status: DC

## 2018-07-20 NOTE — Telephone Encounter (Signed)
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  This was scheduled before I decided not to change his blood pressure medication.

## 2018-07-20 NOTE — Telephone Encounter (Signed)
I have removed the lab appt. Pt still has a nurse visit tomorrow for a BP check.

## 2018-07-21 ENCOUNTER — Other Ambulatory Visit: Payer: Medicare Other

## 2018-07-21 ENCOUNTER — Ambulatory Visit (INDEPENDENT_AMBULATORY_CARE_PROVIDER_SITE_OTHER): Payer: Medicare Other | Admitting: Family Medicine

## 2018-07-21 ENCOUNTER — Ambulatory Visit: Payer: Medicare Other

## 2018-07-21 ENCOUNTER — Encounter: Payer: Self-pay | Admitting: Family Medicine

## 2018-07-21 ENCOUNTER — Other Ambulatory Visit: Payer: Self-pay

## 2018-07-21 VITALS — BP 190/90

## 2018-07-21 DIAGNOSIS — I10 Essential (primary) hypertension: Secondary | ICD-10-CM | POA: Diagnosis not present

## 2018-07-21 DIAGNOSIS — S80869A Insect bite (nonvenomous), unspecified lower leg, initial encounter: Secondary | ICD-10-CM | POA: Diagnosis not present

## 2018-07-21 DIAGNOSIS — E1142 Type 2 diabetes mellitus with diabetic polyneuropathy: Secondary | ICD-10-CM

## 2018-07-21 DIAGNOSIS — W57XXXA Bitten or stung by nonvenomous insect and other nonvenomous arthropods, initial encounter: Secondary | ICD-10-CM

## 2018-07-21 NOTE — Patient Instructions (Signed)
Nice to see you. Please keep an eye on your blood pressure for the next week and let us know what it has been running. Please try to limit salt intake. Please try to stay on top of your pain with your pain regimen.

## 2018-07-21 NOTE — Progress Notes (Signed)
Patient here for nurse visit BP check per order from Dr. [name].   Patient reports compliance with prescribed BP medications: yes  Last dose of BP medication: Last night   BP Readings from Last 3 Encounters:  07/01/18 (!) 140/96  03/01/18 (!) 148/90  12/10/17 116/78   Pulse Readings from Last 3 Encounters:  07/01/18 74  03/01/18 87  12/10/17 82    Per Dr. [name] : turned into a visit with patient.    Patient verbalized understanding of instructions.   Gordy Councilman, CMA

## 2018-07-22 ENCOUNTER — Ambulatory Visit: Payer: Medicare Other

## 2018-07-22 DIAGNOSIS — W57XXXA Bitten or stung by nonvenomous insect and other nonvenomous arthropods, initial encounter: Secondary | ICD-10-CM

## 2018-07-22 DIAGNOSIS — S80869A Insect bite (nonvenomous), unspecified lower leg, initial encounter: Secondary | ICD-10-CM | POA: Insufficient documentation

## 2018-07-22 NOTE — Assessment & Plan Note (Signed)
Appears to be resolving.  No signs of infection at the site of bite.  No fevers.  He will monitor.

## 2018-07-22 NOTE — Progress Notes (Signed)
[name], MD Phone: [phone]  Douglas [name] is a 73 y.o. male who presents today for follow-up.  Patient presented for a nurse BP check though his blood pressure was elevated and this was converted to an office visit with me.  The patient was additionally scheduled for lab work today though this was canceled as we did not make any changes to his blood pressure regimen after his last visit.  It does not appear that this was relayed to the patient and he was upset about this.  I apologized for the lack of communication regarding this.  Hypertension: Notes typically at home it is running at the highest 150 over 80s.  Typically will only go up if he is in pain or if he is irritated.  He notes no chest pain, shortness of breath, or edema.  He is taking losartan 25 mg twice daily, Lasix, and Cardura.  He also notes he has been out of duloxetine for about 2 weeks and just started back on that.  Notes his blood pressure typically runs up when he is off of that as it does help his pain.  He also notes anything salty will increase his blood pressure.  He has been on atenolol and Bystolic previously.  He felt like he was a vegetable on the Bystolic.  He is also taking amlodipine previously and does not want to take that again.  Neuropathy: Patient notes he did go through a period of time where he is taking a couple pain pills daily.  If he had to take the second pill he would feel a little loopy the next day.  He notes his feet are not bothering him at this time.  Tick bite: This is on his posterior left leg.  He notes he got the entire tick out.  He has had no erythema.  He does note some itching where the tick bit him.  No fevers.  Social History   Tobacco Use  Smoking Status Never Smoker  Smokeless Tobacco Never Used     ROS see history of present illness  Objective  Physical Exam Vitals:   07/21/18 1126  BP: (!) 150/90  Pulse: 91  SpO2: 98%    BP Readings from Last 3  Encounters:  07/21/18 (!) 190/90  07/21/18 (!) 150/90  07/01/18 (!) 140/96   Wt Readings from Last 3 Encounters:  07/01/18 262 lb 9.6 oz (119.1 kg)  03/01/18 253 lb 3.2 oz (114.9 kg)  12/10/17 260 lb 6.4 oz (118.1 kg)    Physical Exam Constitutional:      General: He is not in acute distress.    Appearance: He is not diaphoretic.  Cardiovascular:     Rate and Rhythm: Normal rate and regular rhythm.     Heart sounds: Normal heart sounds.  Pulmonary:     Effort: Pulmonary effort is normal.     Breath sounds: Normal breath sounds.  Skin:    General: Skin is warm and dry.     Comments: Small papule noted posterior left calf in the midportion with some excoriation, no evidence of retained tick products  Neurological:     Mental Status: He is alert.      Assessment/Plan: Please see individual problem list.  Essential hypertension Improved on recheck.  I did discuss adding several medications to his regimen though the patient wanted to defer any further medication and work on his dietary issues and keeping his pain under control.  He will monitor his blood  pressure over the next week and contact us with his readings.  If it is still elevated we could consider adding spironolactone given his other intolerances.  Diabetic neuropathy (HCC) Currently stable.  He will continue to monitor and remain on duloxetine and Lyrica and as needed Vicodin.  Controlled substance database reviewed.  Tick bite of calf, initial encounter Appears to be resolving.  No signs of infection at the site of bite.  No fevers.  He will monitor.   No orders of the defined types were placed in this encounter.   No orders of the defined types were placed in this encounter.    [name], MD [facility]

## 2018-07-22 NOTE — Assessment & Plan Note (Signed)
Improved on recheck.  I did discuss adding several medications to his regimen though the patient wanted to defer any further medication and work on his dietary issues and keeping his pain under control.  He will monitor his blood pressure over the next week and contact us with his readings.  If it is still elevated we could consider adding spironolactone given his other intolerances.

## 2018-07-22 NOTE — Assessment & Plan Note (Addendum)
Currently stable.  He will continue to monitor and remain on duloxetine and Lyrica and as needed Vicodin.  Controlled substance database reviewed.

## 2018-08-09 ENCOUNTER — Ambulatory Visit (INDEPENDENT_AMBULATORY_CARE_PROVIDER_SITE_OTHER): Payer: Medicare Other

## 2018-08-09 ENCOUNTER — Other Ambulatory Visit: Payer: Self-pay

## 2018-08-09 DIAGNOSIS — Z0000 Encounter for general adult medical examination without abnormal findings: Secondary | ICD-10-CM

## 2018-08-09 NOTE — Patient Instructions (Addendum)
  Douglas [name] , Thank you for taking time to come for your Medicare Wellness Visit. I appreciate your ongoing commitment to your health goals. Please review the following plan we discussed and let me know if I can assist you in the future.   These are the goals we discussed: Goals      Patient Stated   . HEMOGLOBIN A1C < 7.0 (pt-stated)     Kidney function at 160        This is a list of the screening recommended for you and due dates:  Health Maintenance  Topic Date Due  . Tetanus Vaccine  01/04/2018  . Eye exam for diabetics  04/20/2018  . Flu Shot  11/13/2018  . Hemoglobin A1C  01/01/2019  . Complete foot exam   03/24/2019  .  Hepatitis C: One time screening is recommended by Center for Disease Control  (CDC) for  adults born from [birth year] through 1965.   Completed  . Pneumonia vaccines  Completed

## 2018-08-09 NOTE — Progress Notes (Addendum)
Subjective:   Douglas [name] is a 73 y.o. male who presents for Medicare Annual/Subsequent preventive examination.  Review of Systems:  No ROS.  Medicare Wellness Visit. Additional risk factors are reflected in the social history. Cardiac Risk Factors include: advanced age (>6mn, >>77women);male gender;diabetes mellitus;hypertension    Objective:    Vitals: There were no vitals taken for this visit.  There is no height or weight on file to calculate BMI. UTA vitals, virtual visit. Advanced Directives 08/09/2018 07/03/2017 08/13/2016 07/02/2016 02/04/2016 02/04/2016 08/08/2015  Does Patient Have a Medical Advance Directive? Yes No No No No No No  Type of AParamedicof AMinturnLiving will - - - - - -  Does patient want to make changes to medical advance directive? No - Patient declined - - - - - -  Copy of HFolsomin Chart? No - copy requested - - - - - -  Would patient like information on creating a medical advance directive? - No - Patient declined No - Patient declined No - Patient declined No - patient declined information - No - patient declined information    Tobacco Social History   Tobacco Use  Smoking Status Never Smoker  Smokeless Tobacco Never Used     Counseling given: Not Answered   Clinical Intake:  Pre-visit preparation completed: Yes        Diabetes: Yes(Followed by pcp)  How often do you need to have someone help you when you read instructions, pamphlets, or other written materials from your doctor or pharmacy?: 1 - Never  Interpreter Needed?: No     Past Medical History:  Diagnosis Date  . Arthritis   . Depression   . Diabetes mellitus with complication ([facility]   . Diabetic neuropathy ([place]   . Diastolic dysfunction    a. TTE 07/2017: EF 60-65%, mild concentric LVH, no RWMA, Gr1DD, trivial AI, mildly dilated LA, RVSF normal, PASP normal  . Edema    feet/legs  . GERD (gastroesophageal reflux  disease)   . Gout   . History of stress test    a. MV 06/2017: small in size, mild in severity, apical anterior and apical defect that was minimally reversible and most likely represented artifact and less likely ischemia/scar, LVEF 55-65%, low risk, probably normal stress test  . Hypertension   . Hypothyroidism   . Kidney cysts    renal failure 2013  . Kidney stones    Dr. WRogers Blocker . OSA (obstructive sleep apnea)    supplemental oxygen at night  . Oxygen dependent    hs  . Rocky Mountain spotted fever 12/10/2017   Positve IgG in titer on 11/20/17  . S/P cardiac cath 1998   a. no obstructive disease  . Syncope and collapse   . Temporary low platelet count (HCC)    Past Surgical History:  Procedure Laterality Date  . ANAL FISSURE REPAIR    . BACK SURGERY  1977   rupture disc lumbar spine  [facility]   .Marland KitchenCATARACT EXTRACTION W/PHACO Right 07/16/2016   Procedure: CATARACT EXTRACTION PHACO AND INTRAOCULAR LENS PLACEMENT (IOC);  Surgeon: SEstill Cotta MD;  Location: [facility] ORS;  Service: Ophthalmology;  Laterality: Right;  [place]01:11 AP% 17.6 CDE 24.83 fluid pack lot # [id number]  . CATARACT EXTRACTION W/PHACO Left 08/13/2016   Procedure: CATARACT EXTRACTION PHACO AND INTRAOCULAR LENS PLACEMENT (IOC) suture placed in left eye  at end of procedure;  Surgeon: [name], MD;  Location: [facility] ORS;  Service: Ophthalmology;  Laterality: Left;  [place] 01:55 AP% 22.7 CDE 53.56 fluid pack lot # [id number]  . FINGER AMPUTATION     partial  . KNEE SURGERY  1993   arthroscopy  . SHOULDER SURGERY Bilateral 1998   arthroscopic right, rotator cuff repair left   Family History  Problem Relation Age of Onset  . Breast cancer Mother   . Lung cancer Mother   . Bone cancer Mother   . Heart Problems Mother   . Cancer Mother        breast, lung and rib  . AAA (abdominal aortic aneurysm) Mother   . Heart disease Mother   .  Heart attack Father   . Heart disease Father   . Cancer Brother        esophageal  . Heart attack Brother   . Colon cancer Neg Hx    Social History   Socioeconomic History  . Marital status: Married    Spouse name: [name]  . Number of children: 3  . Years of education: 44  . Highest education level: Not on file  Occupational History    Comment: retired  Scientific laboratory technician  . Financial resource strain: Not hard at all  . Food insecurity:    Worry: Never true    Inability: Never true  . Transportation needs:    Medical: No    Non-medical: No  Tobacco Use  . Smoking status: Never Smoker  . Smokeless tobacco: Never Used  Substance and Sexual Activity  . Alcohol use: No  . Drug use: No  . Sexual activity: Yes  Lifestyle  . Physical activity:    Days per week: 3 days    Minutes per session: 30 min  . Stress: Not at all  Relationships  . Social connections:    Talks on phone: Not on file    Gets together: Not on file    Attends religious service: Not on file    Active member of club or organization: Not on file    Attends meetings of clubs or organizations: Not on file    Relationship status: Not on file  Other Topics Concern  . Not on file  Social History Narrative   Lives in [place] with his wife ([name]). No children. Three step children. No pets.      Work - Patient is retired. Maintenance supervisor.      School - One year college education.      Right handed.      [employer], served in [place], no combat                Outpatient Encounter Medications as of 08/09/2018  Medication Sig  . allopurinol (ZYLOPRIM) 300 MG tablet TAKE 1 TABLET(300 MG) BY MOUTH DAILY  . aspirin 81 MG tablet Take 1 tablet (81 mg total) by mouth daily.  . Blood Glucose Monitoring Suppl (FREESTYLE FREEDOM LITE) W/DEVICE KIT Use as directed  . calcitRIOL (ROCALTROL) 0.25 MCG capsule TK 1 C PO QD  . Cholecalciferol (SM VITAMIN D3) 2000 units CAPS Take 8,000 Units by  mouth daily.  Marland Kitchen doxazosin (CARDURA) 8 MG tablet TAKE 1 TABLET(8 MG) BY MOUTH AT BEDTIME  . DULoxetine (CYMBALTA) 60 MG capsule TAKE 1 CAPSULE(60 MG) BY MOUTH DAILY  . esomeprazole (NEXIUM) 40 MG capsule Take 1 capsule (40 mg total) by mouth daily at 12 noon.  . finasteride (PROSCAR)  5 MG tablet Take 1 tablet (5 mg total) by mouth daily.  . furosemide (LASIX) 40 MG tablet TAKE 1 TABLET(40 MG) BY MOUTH DAILY  . glucose blood (FREESTYLE LITE) test strip Use as directed to check blood sugar twice daily.  Dx: E11.29  . HYDROcodone-acetaminophen (NORCO/VICODIN) 5-325 MG tablet Take 1 tablet by mouth every 6 (six) hours as needed for moderate pain.  . Insulin Glargine (LANTUS SOLOSTAR) 100 UNIT/ML Solostar Pen Inject 48 units into the skin subcutaneously once daily.  . Insulin Pen Needle (PEN NEEDLES) 32G X 5 MM MISC 30 pens by Does not apply route daily.  Marland Kitchen levothyroxine (SYNTHROID, LEVOTHROID) 100 MCG tablet Take 1 tablet (100 mcg total) by mouth daily before breakfast.  . loratadine (CLARITIN) 10 MG tablet Take 10 mg by mouth daily as needed.   Marland Kitchen losartan (COZAAR) 25 MG tablet TAKE 1 TABLET BY MOUTH TWICE DAILY  . LYRICA 100 MG capsule Take 1 capsule (100 mg total) by mouth 3 (three) times daily.  . Magnesium 250 MG TABS Take 1 tablet by mouth daily.   . metFORMIN (GLUCOPHAGE-XR) 500 MG 24 hr tablet TAKE 1 TABLET(500 MG) BY MOUTH TWICE DAILY  . sildenafil (REVATIO) 20 MG tablet 2-3 tabs 1 hour prior to intercourse  . simvastatin (ZOCOR) 40 MG tablet Take 1 tablet (40 mg total) by mouth at bedtime.  . vitamin B-12 (CYANOCOBALAMIN) 250 MCG tablet Take 250 mcg by mouth daily.    No facility-administered encounter medications on file as of 08/09/2018.     Activities of Daily Living In your present state of health, do you have any difficulty performing the following activities: 08/09/2018  Hearing? N  Vision? N  Difficulty concentrating or making decisions? N  Walking or climbing stairs? N   Dressing or bathing? N  Doing errands, shopping? Y  Comment Wife drives him to and from appointments.  Preparing Food and eating ? Y  Comment Wife meal preps.  Patient self feeds.  Using the Toilet? N  In the past six months, have you accidently leaked urine? N  Do you have problems with loss of bowel control? N  Managing your Medications? N  Managing your Finances? Y  Comment Wife manages  Housekeeping or managing your Housekeeping? Y  Comment Wife manages  Some recent data might be hidden    Patient Care Team: [name], MD as PCP - General (Family Medicine) Anthonette Legato, MD (Internal Medicine) Watt Climes, PA as Physician Assistant (Physician Assistant)   Assessment:   This is a routine wellness examination for [place].  I connected with patient 08/09/18 at 4:00 by a video enabled telemedicine application and verified that I am speaking with the correct person using two identifiers. Patient stated full name and DOB. Patient gave permission to continue with virtual visit. Patient's location was at home and Nurse's location was at [place] office. Virtual visit failed in session and telephone was used for the remainder of the visit.   Oxygen 2L at night.  Diabetes- followed by pcp. Monitors blood sugars daily.  Reports 2 ticks removed in the last 2 months. First on 07/02/18 and next on 4/14.  No symptoms presenting, however requests lyme disease test. Notes he has hx of rocky mountain fever. Deferred to pcp for follow up.  He agrees to schedule with physician as appropriate.   Health Screenings  Colonoscopy -08/22/15 Glaucoma -none Hearing -demonstrates normal hearing during conversation. Hemoglobin A1C -07/01/18 (7.1) Cholesterol -08/19/17 (105) TSH -03/01/18 (1.31) Dental-  every 6 months Vision- every 12 months. He plans to schedule an exam.   Social  Alcohol intake -no Smoking history- never Smokers in home? none Illicit drug use? none Exercise -walking 3  times per week, 30 minutes. Diet -low carb/high protein Sexually Active -yes Multiple Partners -none  Safety  Patient feels safe at home.  Patient does have smoke detectors at home  Patient does wear sunscreen or protective clothing when in direct sunlight. Patient does wear seat belt when driving or riding with others.   Activities of Daily Living Patient can do their own household chores. Denies needing assistance with: driving, feeding themselves, getting from bed to chair, getting to the toilet, bathing/showering, dressing, managing money, climbing flight of stairs, or preparing meals.   Walking stick in use when ambulating trails.  Depression Screen Patient denies losing interest in daily life, feeling hopeless, or crying easily over simple problems.   Fall Screen Patient denies being afraid of falling or falling in the last year.   Memory Screen Patient denies problems with memory, misplacing items. Wife manages and  balances checkbook/bank accounts.  Patient is alert, normal appearance, oriented to person/place/and time. Correctly identified the president of the [place], recall of 2/3 objects, and performing simple calculations.  Patient displays appropriate judgement and can read correct time from watch face.  Reads and studies home heritage for brain stimulation.   Immunizations The following Immunizations were discussed: Influenza, shingles, pneumonia, and tetanus.   Other Providers Patient Care Team: [name], MD as PCP - General (Family Medicine) Anthonette Legato, MD (Internal Medicine) Watt Climes, PA as Physician Assistant (Physician Assistant)  Exercise Activities and Dietary recommendations Current Exercise Habits: Home exercise routine, Type of exercise: walking, Time (Minutes): 30, Frequency (Times/Week): 3, Weekly Exercise (Minutes/Week): 90, Intensity: Mild  Goals      Patient Stated   . HEMOGLOBIN A1C < 7.0 (pt-stated)     Kidney function at 160         Fall Risk Fall Risk  08/09/2018 07/03/2017 07/02/2016 03/03/2016 02/04/2016  Falls in the past year? 0 Yes Yes (No Data) Yes  Comment - - - no falls since previous visit -  Number falls in past yr: - 2 or more 1 - 2 or more  Injury with Fall? - No No - (No Data)  Comment - - - - bruises  Risk Factor Category  - - - - High Fall Risk  Risk for fall due to : - - (No Data) - History of fall(s)  Risk for fall due to: Comment - - Lean forward too far - -  Follow up - Falls prevention discussed Falls prevention discussed;Education provided - Education provided   Depression Screen PHQ 2/9 Scores 08/09/2018 07/03/2017 07/02/2016 02/04/2016  PHQ - 2 Score 0 0 0 0    Cognitive Function MMSE - Mini Mental State Exam 07/03/2017 07/02/2016 07/03/2015  Orientation to time 5 5 5   Orientation to Place 5 5 5   Registration 3 3 3   Attention/ Calculation 5 5 5   Recall 3 3 3   Language- name 2 objects 2 2 2   Language- repeat 1 1 1   Language- follow 3 step command 3 3 3   Language- read & follow direction 1 1 1   Write a sentence 1 1 1   Copy design 1 1 1   Total score 30 30 30      6CIT Screen 08/09/2018  What Year? 0 points  What month? 0 points  What time? 0 points  Count back from 20 0 points  Months in reverse 0 points  Repeat phrase 0 points  Total Score 0    Immunization History  Administered Date(s) Administered  . Influenza, High Dose Seasonal PF 02/15/2016, 02/16/2017, 03/01/2018  . Influenza,inj,Quad PF,6+ Mos 01/04/2013, 02/20/2014, 02/27/2015  . Pneumococcal Conjugate-13 05/02/2013  . Pneumococcal Polysaccharide-23 01/05/2011  . Tdap 01/05/2008   Screening Tests Health Maintenance  Topic Date Due  . TETANUS/TDAP  01/04/2018  . OPHTHALMOLOGY EXAM  04/20/2018  . INFLUENZA VACCINE  11/13/2018  . HEMOGLOBIN A1C  01/01/2019  . FOOT EXAM  03/24/2019  . Hepatitis C Screening  Completed  . PNA vac Low Risk Adult  Completed      Plan:    End of life planning; Advance aging;  Advanced directives discussed. Copy of current HCPOA/Living Will requested upon completion.    I have personally reviewed and noted the following in the patient's chart:   . Medical and social history . Use of alcohol, tobacco or illicit drugs  . Current medications and supplements . Functional ability and status . Nutritional status . Physical activity . Advanced directives . List of other physicians . Hospitalizations, surgeries, and ER visits in previous 12 months . Vitals . Screenings to include cognitive, depression, and falls . Referrals and appointments  In addition, I have reviewed and discussed with patient certain preventive protocols, quality metrics, and best practice recommendations. A written personalized care plan for preventive services as well as general preventive health recommendations were provided to patient.     Varney Biles, LPN  7/46/0029  Agree with plan. Calling patient to schedule f/u with pcp to discuss tick bites.   Mable Paris, NP

## 2018-08-11 ENCOUNTER — Telehealth: Payer: Self-pay | Admitting: Family

## 2018-08-11 NOTE — Telephone Encounter (Signed)
Call pt Douglas [name] inquired about lyme testing after ticks removed; Douglas [name] told denisa this.   Please make a doxy with pcp

## 2018-08-11 NOTE — Telephone Encounter (Signed)
-----   Message from Dia Crawford, LPN sent at 1/59/4707  4:43 PM EDT ----- Requests lyme disease test. See note.  Deferred to physician as appropriate.  Please review, sign and close chart.  PCP is currently scheduled out of the office.

## 2018-08-11 NOTE — Telephone Encounter (Signed)
Called pt and left a VM to call back. CRM created and sent to [facility] pool. 

## 2018-08-13 NOTE — Telephone Encounter (Signed)
Called pt and left a VM to call back. CRM created and sent to [facility] pool. 

## 2018-08-15 ENCOUNTER — Other Ambulatory Visit: Payer: Self-pay | Admitting: Family Medicine

## 2018-08-15 DIAGNOSIS — E1142 Type 2 diabetes mellitus with diabetic polyneuropathy: Secondary | ICD-10-CM

## 2018-08-16 ENCOUNTER — Other Ambulatory Visit: Payer: Self-pay

## 2018-08-16 ENCOUNTER — Ambulatory Visit (INDEPENDENT_AMBULATORY_CARE_PROVIDER_SITE_OTHER): Payer: Medicare Other | Admitting: Family Medicine

## 2018-08-16 ENCOUNTER — Other Ambulatory Visit: Payer: Self-pay | Admitting: Family Medicine

## 2018-08-16 ENCOUNTER — Telehealth: Payer: Self-pay | Admitting: Family Medicine

## 2018-08-16 ENCOUNTER — Encounter: Payer: Self-pay | Admitting: Family Medicine

## 2018-08-16 DIAGNOSIS — S80269A Insect bite (nonvenomous), unspecified knee, initial encounter: Secondary | ICD-10-CM

## 2018-08-16 DIAGNOSIS — W57XXXA Bitten or stung by nonvenomous insect and other nonvenomous arthropods, initial encounter: Secondary | ICD-10-CM | POA: Diagnosis not present

## 2018-08-16 DIAGNOSIS — M549 Dorsalgia, unspecified: Secondary | ICD-10-CM

## 2018-08-16 DIAGNOSIS — G8929 Other chronic pain: Secondary | ICD-10-CM | POA: Diagnosis not present

## 2018-08-16 DIAGNOSIS — J309 Allergic rhinitis, unspecified: Secondary | ICD-10-CM | POA: Diagnosis not present

## 2018-08-16 DIAGNOSIS — E1142 Type 2 diabetes mellitus with diabetic polyneuropathy: Secondary | ICD-10-CM

## 2018-08-16 MED ORDER — FLUTICASONE PROPIONATE 50 MCG/ACT NA SUSP: 2.0000 | Freq: Every day | NASAL | 6 refills | Status: DC

## 2018-08-16 NOTE — Telephone Encounter (Signed)
Please call the patient and get him set up for labs this week.

## 2018-08-16 NOTE — Progress Notes (Signed)
Virtual Visit via telephone Note  This visit type was conducted due to national recommendations for restrictions regarding the COVID-19 pandemic (e.g. social distancing).  This format is felt to be most appropriate for this patient at this time.  All issues noted in this document were discussed and addressed.  No physical exam was performed (except for noted visual exam findings with Video Visits).   I connected with Douglas [name] on 08/16/18 at  4:00 PM EDT by a video enabled telemedicine application or telephone and verified that I am speaking with the correct person using two identifiers. Location patient: home Location provider: work Persons participating in the virtual visit: patient, provider  I discussed the limitations, risks, security and privacy concerns of performing an evaluation and management service by telephone and the availability of in person appointments. I also discussed with the patient that there may be a patient responsible charge related to this service. The patient expressed understanding and agreed to proceed.  Interactive audio and video telecommunications were attempted between this provider and patient, however failed, due to patient having technical difficulties OR patient did not have access to video capability.  We continued and completed visit with audio only.  Reason for visit: Same day visit.  HPI: Tick bite: Patient reports 2 tick bites.  One was on 06/26/2018 on his left chest and the other one was on 07/24/2018 on the posterior aspect of his left knee.  He noted he was able to remove them completely.  He has had no itching or rash.  He has not felt ill.  He has chronic aching which is unchanged.  He said no fevers.  He notes both were engorged.  He is requesting Lyme disease testing.  Allergic rhinitis: Patient notes he has had some mild allergy symptoms this spring.  He has had I burning and stinging.  His nose is dry.  He is had a little bit of a scratchy  throat.  He will take loratadine and gargle salt water and that will improve his symptoms.  He has had some sneezing.  No fevers.  No cough.  No travel.  No COVID-19 exposure.  Chronic pain: He continues to follow with pain management.  He has had some sciatica symptoms that have been chronic.  He is seeing pain management soon and they may consider another injection.  Lyrica is helpful.  He does use salon pas and clove oil for his foot pain.  Occasionally takes hydrocodone.   ROS: See pertinent positives and negatives per HPI.  Past Medical History:  Diagnosis Date  . Arthritis   . Depression   . Diabetes mellitus with complication ([place])   . Diabetic neuropathy ([place])   . Diastolic dysfunction    a. TTE 07/2017: EF 60-65%, mild concentric LVH, no RWMA, Gr1DD, trivial AI, mildly dilated LA, RVSF normal, PASP normal  . Edema    feet/legs  . GERD (gastroesophageal reflux disease)   . Gout   . History of stress test    a. MV 06/2017: small in size, mild in severity, apical anterior and apical defect that was minimally reversible and most likely represented artifact and less likely ischemia/scar, LVEF 55-65%, low risk, probably normal stress test  . Hypertension   . Hypothyroidism   . Kidney cysts    renal failure 2013  . Kidney stones    Dr. Rogers Blocker  . OSA (obstructive sleep apnea)    supplemental oxygen at night  . Oxygen dependent    hs  .  [place] spotted fever 12/10/2017   Positve IgG in titer on 11/20/17  . S/P cardiac cath 1998   a. no obstructive disease  . Syncope and collapse   . Temporary low platelet count (HCC)     Past Surgical History:  Procedure Laterality Date  . ANAL FISSURE REPAIR    . BACK SURGERY  1977   rupture disc lumbar spine  . [facility] with [facility] Vascular.   Marland Kitchen CATARACT EXTRACTION W/PHACO Right 07/16/2016   Procedure: CATARACT EXTRACTION PHACO AND INTRAOCULAR LENS PLACEMENT (IOC);  Surgeon: [name], MD;  Location: [facility] ORS;  Service: Ophthalmology;  Laterality: Right;  [place] 01:11 AP% 17.6 CDE 24.83 fluid pack lot # [id number]  . CATARACT EXTRACTION W/PHACO Left 08/13/2016   Procedure: CATARACT EXTRACTION PHACO AND INTRAOCULAR LENS PLACEMENT ([place]) suture placed in left eye at end of procedure;  Surgeon: [name], MD;  Location: [facility] ORS;  Service: Ophthalmology;  Laterality: Left;  [place] 01:55 AP% 22.7 CDE 53.56 fluid pack lot # [id number]  . FINGER AMPUTATION     partial  . KNEE SURGERY  1993   arthroscopy  . SHOULDER SURGERY Bilateral 1998   arthroscopic right, rotator cuff repair left    Family History  Problem Relation Age of Onset  . Breast cancer Mother   . Lung cancer Mother   . Bone cancer Mother   . Heart Problems Mother   . Cancer Mother        breast, lung and rib  . AAA (abdominal aortic aneurysm) Mother   . Heart disease Mother   . Heart attack Father   . Heart disease Father   . Cancer Brother        esophageal  . Heart attack Brother   . Colon cancer Neg Hx     SOCIAL HX: Non-smoker.   Current Outpatient Medications:  .  allopurinol (ZYLOPRIM) 300 MG tablet, TAKE 1 TABLET(300 MG) BY MOUTH DAILY, Disp: 90 tablet, Rfl: 0 .  aspirin 81 MG tablet, Take 1 tablet (81 mg total) by mouth daily., Disp: , Rfl:  .  Blood Glucose Monitoring Suppl (FREESTYLE FREEDOM LITE) W/DEVICE KIT, Use as directed, Disp: 1 each, Rfl: 0 .  calcitRIOL (ROCALTROL) 0.25 MCG capsule, TK 1 C PO QD, Disp: , Rfl: 5 .  Cholecalciferol (SM VITAMIN D3) 2000 units CAPS, Take 8,000 Units by mouth daily., Disp: , Rfl:  .  doxazosin (CARDURA) 8 MG tablet, TAKE 1 TABLET(8 MG) BY MOUTH AT BEDTIME, Disp: 90 tablet, Rfl: 0 .  DULoxetine (CYMBALTA) 60 MG capsule, TAKE 1 CAPSULE(60 MG) BY MOUTH DAILY, Disp: 90 capsule, Rfl: 0 .  esomeprazole (NEXIUM) 40 MG capsule, Take 1 capsule (40 mg total) by mouth daily at 12 noon., Disp: 90 capsule, Rfl: 3 .  finasteride (PROSCAR) 5 MG tablet, Take  1 tablet (5 mg total) by mouth daily., Disp: 90 tablet, Rfl: 0 .  furosemide (LASIX) 40 MG tablet, TAKE 1 TABLET(40 MG) BY MOUTH DAILY, Disp: 90 tablet, Rfl: 0 .  glucose blood (FREESTYLE LITE) test strip, Use as directed to check blood sugar twice daily.  Dx: E11.29, Disp: 200 each, Rfl: 3 .  HYDROcodone-acetaminophen (NORCO/VICODIN) 5-325 MG tablet, Take 1 tablet by mouth every 6 (six) hours as needed for moderate pain., Disp: 30 tablet, Rfl: 0 .  Insulin Glargine (LANTUS SOLOSTAR) 100 UNIT/ML Solostar Pen, Inject 48 units into the skin subcutaneously once daily., Disp: 15 mL, Rfl: 11 .  Insulin Pen Needle (PEN NEEDLES) 32G X 5 MM MISC, 30 pens by Does not apply route daily., Disp: 50 each, Rfl: 11 .  levothyroxine (SYNTHROID, LEVOTHROID) 100 MCG tablet, Take 1 tablet (100 mcg total) by mouth daily before breakfast., Disp: 90 tablet, Rfl: 3 .  loratadine (CLARITIN) 10 MG tablet, Take 10 mg by mouth daily as needed. , Disp: , Rfl:  .  losartan (COZAAR) 25 MG tablet, TAKE 1 TABLET BY MOUTH TWICE DAILY, Disp: 180 tablet, Rfl: 0 .  LYRICA 100 MG capsule, Take 1 capsule (100 mg total) by mouth 3 (three) times daily., Disp: 270 capsule, Rfl: 3 .  Magnesium 250 MG TABS, Take 1 tablet by mouth daily. , Disp: , Rfl:  .  metFORMIN (GLUCOPHAGE-XR) 500 MG 24 hr tablet, TAKE 1 TABLET(500 MG) BY MOUTH TWICE DAILY, Disp: 180 tablet, Rfl: 3 .  sildenafil (REVATIO) 20 MG tablet, 2-3 tabs 1 hour prior to intercourse, Disp: 10 tablet, Rfl: 0 .  simvastatin (ZOCOR) 40 MG tablet, Take 1 tablet (40 mg total) by mouth at bedtime., Disp: 90 tablet, Rfl: 3 .  vitamin B-12 (CYANOCOBALAMIN) 250 MCG tablet, Take 250 mcg by mouth daily. , Disp: , Rfl:  .  fluticasone (FLONASE) 50 MCG/ACT nasal spray, Place 2 sprays into both nostrils daily., Disp: 16 g, Rfl: 6  EXAM: This was a telehealth telephone visit and thus a physical exam was not completed.  ASSESSMENT AND PLAN:  Discussed the following assessment and  plan:  Tick bite of knee, initial encounter - Plan: B. burgdorfi antibodies  Allergic rhinitis, unspecified seasonality, unspecified trigger  Chronic back pain greater than 3 months duration  Tick bite of knee, initial encounter Patient with numerous tick bites recently.  No indication for [facility] spotted fever testing.  He is concerned about Lyme disease and I think it is reasonable to check for this given his chronic achiness.  Discussed that he needs to check himself thoroughly when he comes in from working outside.  We will get him set up for lab work.  Allergic rhinitis Symptoms are consistent with allergic rhinitis.  We will trial him on Flonase.  He will continue loratadine.  Given return precautions.  Chronic back pain greater than 3 months duration Patient notes chronic issues with pain and sciatica.  He continues to follow with pain management.  I have encouraged him to continue to see them and discuss further options for treatment with them.  Social distancing precautions and sick precautions given regarding COVID-19.   I discussed the assessment and treatment plan with the patient. The patient was provided an opportunity to ask questions and all were answered. The patient agreed with the plan and demonstrated an understanding of the instructions.   The patient was advised to call back or seek an in-person evaluation if the symptoms worsen or if the condition fails to improve as anticipated.  I provided 22 minutes of non-face-to-face time during this encounter.   [name], MD

## 2018-08-16 NOTE — Telephone Encounter (Signed)
Pt has been scheduled for a phone visit today and would like to discuss testing due to tick bites. Pt does not have a cell phone.

## 2018-08-17 DIAGNOSIS — S80269A Insect bite (nonvenomous), unspecified knee, initial encounter: Secondary | ICD-10-CM | POA: Insufficient documentation

## 2018-08-17 DIAGNOSIS — W57XXXA Bitten or stung by nonvenomous insect and other nonvenomous arthropods, initial encounter: Secondary | ICD-10-CM | POA: Insufficient documentation

## 2018-08-17 NOTE — Assessment & Plan Note (Signed)
Patient notes chronic issues with pain and sciatica.  He continues to follow with pain management.  I have encouraged him to continue to see them and discuss further options for treatment with them.

## 2018-08-17 NOTE — Assessment & Plan Note (Signed)
Patient with numerous tick bites recently.  No indication for [facility] spotted fever testing.  He is concerned about Lyme disease and I think it is reasonable to check for this given his chronic achiness.  Discussed that he needs to check himself thoroughly when he comes in from working outside.  We will get him set up for lab work.

## 2018-08-17 NOTE — Assessment & Plan Note (Signed)
Symptoms are consistent with allergic rhinitis.  We will trial him on Flonase.  He will continue loratadine.  Given return precautions.

## 2018-08-17 NOTE — Telephone Encounter (Signed)
Pt has been scheduled for lab work 08/18/2018 @ 10:45

## 2018-08-18 ENCOUNTER — Other Ambulatory Visit (INDEPENDENT_AMBULATORY_CARE_PROVIDER_SITE_OTHER): Payer: Medicare Other

## 2018-08-18 ENCOUNTER — Other Ambulatory Visit: Payer: Self-pay

## 2018-08-18 DIAGNOSIS — S80269A Insect bite (nonvenomous), unspecified knee, initial encounter: Secondary | ICD-10-CM | POA: Diagnosis not present

## 2018-08-18 DIAGNOSIS — W57XXXA Bitten or stung by nonvenomous insect and other nonvenomous arthropods, initial encounter: Secondary | ICD-10-CM

## 2018-08-19 LAB — B. BURGDORFI ANTIBODIES: B burgdorferi Ab IgG+IgM: <0.9 index

## 2018-08-23 DIAGNOSIS — M5416 Radiculopathy, lumbar region: Secondary | ICD-10-CM | POA: Diagnosis not present

## 2018-08-23 DIAGNOSIS — M47812 Spondylosis without myelopathy or radiculopathy, cervical region: Secondary | ICD-10-CM | POA: Diagnosis not present

## 2018-08-23 DIAGNOSIS — M48061 Spinal stenosis, lumbar region without neurogenic claudication: Secondary | ICD-10-CM | POA: Diagnosis not present

## 2018-09-14 ENCOUNTER — Other Ambulatory Visit: Payer: Self-pay | Admitting: Family Medicine

## 2018-09-14 DIAGNOSIS — Z760 Encounter for issue of repeat prescription: Secondary | ICD-10-CM

## 2018-09-20 ENCOUNTER — Ambulatory Visit: Payer: Medicare Other | Admitting: Urology

## 2018-09-21 DIAGNOSIS — M5416 Radiculopathy, lumbar region: Secondary | ICD-10-CM | POA: Diagnosis not present

## 2018-09-27 ENCOUNTER — Ambulatory Visit: Payer: Medicare Other

## 2018-10-09 ENCOUNTER — Other Ambulatory Visit: Payer: Self-pay | Admitting: Family Medicine

## 2018-10-14 ENCOUNTER — Other Ambulatory Visit: Payer: Self-pay | Admitting: Family Medicine

## 2018-10-20 DIAGNOSIS — M48061 Spinal stenosis, lumbar region without neurogenic claudication: Secondary | ICD-10-CM | POA: Diagnosis not present

## 2018-10-20 DIAGNOSIS — M5416 Radiculopathy, lumbar region: Secondary | ICD-10-CM | POA: Diagnosis not present

## 2018-10-20 DIAGNOSIS — M47812 Spondylosis without myelopathy or radiculopathy, cervical region: Secondary | ICD-10-CM | POA: Diagnosis not present

## 2018-10-20 DIAGNOSIS — E11610 Type 2 diabetes mellitus with diabetic neuropathic arthropathy: Secondary | ICD-10-CM | POA: Diagnosis not present

## 2018-10-21 DIAGNOSIS — D631 Anemia in chronic kidney disease: Secondary | ICD-10-CM | POA: Diagnosis not present

## 2018-10-21 DIAGNOSIS — R600 Localized edema: Secondary | ICD-10-CM | POA: Diagnosis not present

## 2018-10-21 DIAGNOSIS — N2581 Secondary hyperparathyroidism of renal origin: Secondary | ICD-10-CM | POA: Diagnosis not present

## 2018-10-21 DIAGNOSIS — N183 Chronic kidney disease, stage 3 (moderate): Secondary | ICD-10-CM | POA: Diagnosis not present

## 2018-10-21 DIAGNOSIS — I10 Essential (primary) hypertension: Secondary | ICD-10-CM | POA: Diagnosis not present

## 2018-10-27 DIAGNOSIS — Z20828 Contact with and (suspected) exposure to other viral communicable diseases: Secondary | ICD-10-CM | POA: Diagnosis not present

## 2018-11-05 ENCOUNTER — Encounter: Payer: Self-pay | Admitting: Family Medicine

## 2018-11-05 ENCOUNTER — Telehealth: Payer: Self-pay | Admitting: Family Medicine

## 2018-11-05 ENCOUNTER — Ambulatory Visit (INDEPENDENT_AMBULATORY_CARE_PROVIDER_SITE_OTHER): Payer: Medicare Other | Admitting: Family Medicine

## 2018-11-05 ENCOUNTER — Other Ambulatory Visit: Payer: Self-pay

## 2018-11-05 VITALS — Ht 70.0 in | Wt 262.0 lb

## 2018-11-05 DIAGNOSIS — M1A3790 Chronic gout due to renal impairment, unspecified ankle and foot, without tophus (tophi): Secondary | ICD-10-CM | POA: Diagnosis not present

## 2018-11-05 DIAGNOSIS — E785 Hyperlipidemia, unspecified: Secondary | ICD-10-CM | POA: Diagnosis not present

## 2018-11-05 DIAGNOSIS — F1190 Opioid use, unspecified, uncomplicated: Secondary | ICD-10-CM

## 2018-11-05 DIAGNOSIS — E1142 Type 2 diabetes mellitus with diabetic polyneuropathy: Secondary | ICD-10-CM | POA: Diagnosis not present

## 2018-11-05 DIAGNOSIS — J989 Respiratory disorder, unspecified: Secondary | ICD-10-CM

## 2018-11-05 MED ORDER — PEN NEEDLES 32G X 5 MM MISC: 1.0000 "pen " | Freq: Every day | 3 refills | Status: DC

## 2018-11-05 MED ORDER — HYDROCODONE-ACETAMINOPHEN 5-325 MG PO TABS: 1.0000 | ORAL_TABLET | Freq: Four times a day (QID) | ORAL | 0 refills | Status: DC | PRN

## 2018-11-05 NOTE — Assessment & Plan Note (Signed)
Check lipid panel  

## 2018-11-05 NOTE — Assessment & Plan Note (Signed)
Symptoms have resolved.  He tested negative for COVID-19.  He will monitor for any recurrence of symptoms.

## 2018-11-05 NOTE — Assessment & Plan Note (Signed)
Some of his feet symptoms could be related to gout.  We will check a uric acid as he is due for this.

## 2018-11-05 NOTE — Telephone Encounter (Signed)
Please let the patient know that the best way to do his labs currently would be to go to a [facility] draw station.  He should wait until he is greater than 14 days after his last symptoms of his recent respiratory illness prior to going to have his labs completed.  Thanks.

## 2018-11-05 NOTE — Telephone Encounter (Signed)
I called informed the patient to go to [facility] to have his labs drawed 14 days after his last symptoms, patient understood.  Raji Glinski,cma

## 2018-11-05 NOTE — Assessment & Plan Note (Signed)
Patient with chronic pain related to neuropathy.  He is due for urine drug screen and he will have this completed with his labs at [facility].  Controlled substance database was reviewed.  A small number of pills will be refilled to cover until his urine drug screen is completed.

## 2018-11-05 NOTE — Assessment & Plan Note (Signed)
Continue current regimen.  Check lab work.

## 2018-11-05 NOTE — Progress Notes (Signed)
Virtual Visit via telephone  Note  This visit type was conducted due to national recommendations for restrictions regarding the COVID-19 pandemic (e.g. social distancing).  This format is felt to be most appropriate for this patient at this time.  All issues noted in this document were discussed and addressed.  No physical exam was performed (except for noted visual exam findings with Video Visits).   I connected with Douglas [name] today at  1:15 PM EDT by telephone and verified that I am speaking with the correct person using two identifiers. Location patient: home Location provider: work Persons participating in the virtual visit: patient, provider  I discussed the limitations, risks, security and privacy concerns of performing an evaluation and management service by telephone and the availability of in person appointments. I also discussed with the patient that there may be a patient responsible charge related to this service. The patient expressed understanding and agreed to proceed.  Interactive audio and video telecommunications were attempted between this provider and patient, however failed, due to patient having technical difficulties OR patient did not have access to video capability.  We continued and completed visit with audio only.   Reason for visit: follow-up  HPI: DIABETES Disease Monitoring: Blood Sugar [id number] fasting, mostly less than 140 fasting Polyuria/phagia/dipsia- no      Optho- due Medications: Compliance- taking lantus, metformin Hypoglycemic symptoms- no  Chronic pain: Patient does have chronic pain related to neuropathy.  Pain is in his big toes bilaterally.  He has seen neurology for this.  He takes Vicodin when this gets really severe though typically takes this infrequently and last took it several months ago.  The Lyrica does help with this pain.  He does report at times his toes feel feverish like they are infected though it resolves within a day.   He will use clove oil with good benefit.  They are quite tender to touch.  He does have a history of gout though he notes that is typically in the joints in his feet and has not had a flare recently.  He is taking allopurinol.  Respiratory illness: Patient had a cough and sore throat for about 10 days.  He subsequently got evaluated and was tested for COVID-19 on 10/27/2018.  This test was negative.  He had a minor fever.  He notes most every thing with his symptoms has resolved.  He does have chronic joint aches which are back to normal.  He noted no COVID exposure.  ROS: See pertinent positives and negatives per HPI.  Past Medical History:  Diagnosis Date  . Arthritis   . Depression   . Diabetes mellitus with complication ([place])   . Diabetic neuropathy ([place])   . Diastolic dysfunction    a. TTE 07/2017: EF 60-65%, mild concentric LVH, no RWMA, Gr1DD, trivial AI, mildly dilated LA, RVSF normal, PASP normal  . Edema    feet/legs  . GERD (gastroesophageal reflux disease)   . Gout   . History of stress test    a. MV 06/2017: small in size, mild in severity, apical anterior and apical defect that was minimally reversible and most likely represented artifact and less likely ischemia/scar, LVEF 55-65%, low risk, probably normal stress test  . Hypertension   . Hypothyroidism   . Kidney cysts    renal failure 2013  . Kidney stones    Dr. Rogers Blocker  . OSA (obstructive sleep apnea)    supplemental oxygen at night  . Oxygen dependent  hs  . [facility] spotted fever 12/10/2017   Positve IgG in titer on 11/20/17  . S/P cardiac cath 1998   a. no obstructive disease  . Syncope and collapse   . Temporary low platelet count (HCC)     Past Surgical History:  Procedure Laterality Date  . ANAL FISSURE REPAIR    . BACK SURGERY  1977   rupture disc lumbar spine  . [facility] with [facility] and Vascular.   Marland Kitchen CATARACT EXTRACTION W/PHACO Right 07/16/2016    Procedure: CATARACT EXTRACTION PHACO AND INTRAOCULAR LENS PLACEMENT (IOC);  Surgeon: [name], MD;  Location: [facility] ORS;  Service: Ophthalmology;  Laterality: Right;  [place] 01:11 AP% 17.6 CDE 24.83 fluid pack lot # [id number]  . CATARACT EXTRACTION W/PHACO Left 08/13/2016   Procedure: CATARACT EXTRACTION PHACO AND INTRAOCULAR LENS PLACEMENT ([place]) suture placed in left eye at end of procedure;  Surgeon: [name], MD;  Location: [facility] ORS;  Service: Ophthalmology;  Laterality: Left;  [place] 01:55 AP% 22.7 CDE 53.56 fluid pack lot # [id number]  . FINGER AMPUTATION     partial  . KNEE SURGERY  1993   arthroscopy  . SHOULDER SURGERY Bilateral 1998   arthroscopic right, rotator cuff repair left    Family History  Problem Relation Age of Onset  . Breast cancer Mother   . Lung cancer Mother   . Bone cancer Mother   . Heart Problems Mother   . Cancer Mother        breast, lung and rib  . AAA (abdominal aortic aneurysm) Mother   . Heart disease Mother   . Heart attack Father   . Heart disease Father   . Cancer Brother        esophageal  . Heart attack Brother   . Colon cancer Neg Hx     SOCIAL HX: Non-smoker.   Current Outpatient Medications:  .  allopurinol (ZYLOPRIM) 300 MG tablet, TAKE 1 TABLET(300 MG) BY MOUTH DAILY, Disp: 90 tablet, Rfl: 0 .  aspirin 81 MG tablet, Take 1 tablet (81 mg total) by mouth daily., Disp: , Rfl:  .  Blood Glucose Monitoring Suppl (FREESTYLE FREEDOM LITE) W/DEVICE KIT, Use as directed, Disp: 1 each, Rfl: 0 .  calcitRIOL (ROCALTROL) 0.25 MCG capsule, TK 1 C PO QD, Disp: , Rfl: 5 .  Cholecalciferol (SM VITAMIN D3) 2000 units CAPS, Take 8,000 Units by mouth daily., Disp: , Rfl:  .  doxazosin (CARDURA) 8 MG tablet, TAKE 1 TABLET(8 MG) BY MOUTH AT BEDTIME, Disp: 90 tablet, Rfl: 0 .  DULoxetine (CYMBALTA) 60 MG capsule, TAKE 1 CAPSULE(60 MG) BY MOUTH DAILY, Disp: 90 capsule, Rfl: 1 .  esomeprazole (NEXIUM) 40 MG capsule, Take 1 capsule (40 mg total) by  mouth daily at 12 noon., Disp: 90 capsule, Rfl: 3 .  finasteride (PROSCAR) 5 MG tablet, Take 1 tablet (5 mg total) by mouth daily., Disp: 90 tablet, Rfl: 0 .  fluticasone (FLONASE) 50 MCG/ACT nasal spray, Place 2 sprays into both nostrils daily., Disp: 16 g, Rfl: 6 .  furosemide (LASIX) 40 MG tablet, TAKE 1 TABLET(40 MG) BY MOUTH DAILY, Disp: 90 tablet, Rfl: 0 .  glucose blood (FREESTYLE LITE) test strip, CHECK BLOOD SUGAR TWICE DAILY, Disp: 200 each, Rfl: 6 .  HYDROcodone-acetaminophen (NORCO/VICODIN) 5-325 MG tablet, Take 1 tablet by mouth every 6 (six) hours as needed for moderate pain., Disp: 30 tablet, Rfl: 0 .  Insulin Glargine (LANTUS SOLOSTAR) 100 UNIT/ML  Solostar Pen, Inject 48 units into the skin subcutaneously once daily., Disp: 15 mL, Rfl: 11 .  Insulin Pen Needle (PEN NEEDLES) 32G X 5 MM MISC, 30 pens by Does not apply route daily., Disp: 50 each, Rfl: 11 .  levothyroxine (SYNTHROID, LEVOTHROID) 100 MCG tablet, Take 1 tablet (100 mcg total) by mouth daily before breakfast., Disp: 90 tablet, Rfl: 3 .  loratadine (CLARITIN) 10 MG tablet, Take 10 mg by mouth daily as needed. , Disp: , Rfl:  .  losartan (COZAAR) 25 MG tablet, TAKE 1 TABLET BY MOUTH TWICE DAILY, Disp: 180 tablet, Rfl: 0 .  LYRICA 100 MG capsule, Take 1 capsule (100 mg total) by mouth 3 (three) times daily., Disp: 270 capsule, Rfl: 3 .  Magnesium 250 MG TABS, Take 1 tablet by mouth daily. , Disp: , Rfl:  .  metFORMIN (GLUCOPHAGE-XR) 500 MG 24 hr tablet, TAKE 1 TABLET(500 MG) BY MOUTH TWICE DAILY, Disp: 180 tablet, Rfl: 3 .  sildenafil (REVATIO) 20 MG tablet, 2-3 tabs 1 hour prior to intercourse, Disp: 10 tablet, Rfl: 0 .  simvastatin (ZOCOR) 40 MG tablet, Take 1 tablet (40 mg total) by mouth at bedtime., Disp: 90 tablet, Rfl: 3 .  vitamin B-12 (CYANOCOBALAMIN) 250 MCG tablet, Take 250 mcg by mouth daily. , Disp: , Rfl:   EXAM: This is a telehealth telephone visit notes no physical exam was completed.  ASSESSMENT AND PLAN:   Discussed the following assessment and plan:  Diabetic neuropathy ([place]) Patient with chronic pain related to neuropathy.  He is due for urine drug screen and he will have this completed with his labs at [facility].  Controlled substance database was reviewed.  A small number of pills will be refilled to cover until his urine drug screen is completed.  DM type 2 with diabetic peripheral neuropathy (HCC) Continue current regimen.  Check lab work.  Gout Some of his feet symptoms could be related to gout.  We will check a uric acid as he is due for this.  Hyperlipidemia Check lipid panel.  Respiratory illness Symptoms have resolved.  He tested negative for COVID-19.  He will monitor for any recurrence of symptoms.   Social distancing precautions and sick precautions given regarding COVID-19.   I discussed the assessment and treatment plan with the patient. The patient was provided an opportunity to ask questions and all were answered. The patient agreed with the plan and demonstrated an understanding of the instructions.   The patient was advised to call back or seek an in-person evaluation if the symptoms worsen or if the condition fails to improve as anticipated.  I provided 21 minutes of non-face-to-face time during this encounter.   [name], MD

## 2018-11-09 ENCOUNTER — Ambulatory Visit (INDEPENDENT_AMBULATORY_CARE_PROVIDER_SITE_OTHER): Payer: Medicare Other | Admitting: Urology

## 2018-11-09 ENCOUNTER — Encounter: Payer: Self-pay | Admitting: Urology

## 2018-11-09 ENCOUNTER — Other Ambulatory Visit: Payer: Self-pay

## 2018-11-09 VITALS — BP 137/73 | HR 89 | Ht 71.0 in | Wt 269.1 lb

## 2018-11-09 DIAGNOSIS — N5201 Erectile dysfunction due to arterial insufficiency: Secondary | ICD-10-CM | POA: Diagnosis not present

## 2018-11-09 DIAGNOSIS — R3912 Poor urinary stream: Secondary | ICD-10-CM

## 2018-11-09 DIAGNOSIS — N401 Enlarged prostate with lower urinary tract symptoms: Secondary | ICD-10-CM | POA: Diagnosis not present

## 2018-11-09 LAB — BLADDER SCAN AMB NON-IMAGING

## 2018-11-09 NOTE — Progress Notes (Signed)
11/09/2018 9:22 PM   Douglas [name] June 09, 1945 [id number]  Referring provider: [name], MD [address] [place],  [place] [address]  Chief Complaint  Patient presents with  . Benign Prostatic Hypertrophy    Urologic history: 1.  BPH with lower urinary tract symptoms  -Combination tx doxazosin/finasteride  2.  Erectile dysfunction   HPI: 73 y.o. male presents for annual follow-up of the above problem list.  He remains on doxazosin and finasteride.  Overall he states he is doing well.  He has urgency and decreased force and caliber of his urinary stream greater than 50% of the time.  He also has nocturia x2.  IPSS completed today was 10/2.  Denies dysuria or gross hematuria.  He has no flank, abdominal, pelvic or scrotal pain.  He has been using sildenafil for ED however states it has not been effective recently.  PMH: Past Medical History:  Diagnosis Date  . Arthritis   . Depression   . Diabetes mellitus with complication ([place])   . Diabetic neuropathy ([place])   . Diastolic dysfunction    a. TTE 07/2017: EF 60-65%, mild concentric LVH, no RWMA, Gr1DD, trivial AI, mildly dilated LA, RVSF normal, PASP normal  . Edema    feet/legs  . GERD (gastroesophageal reflux disease)   . Gout   . History of stress test    a. MV 06/2017: small in size, mild in severity, apical anterior and apical defect that was minimally reversible and most likely represented artifact and less likely ischemia/scar, LVEF 55-65%, low risk, probably normal stress test  . Hypertension   . Hypothyroidism   . Kidney cysts    renal failure 2013  . Kidney stones    Dr. Rogers Blocker  . OSA (obstructive sleep apnea)    supplemental oxygen at night  . Oxygen dependent    hs  . Rocky Mountain spotted fever 12/10/2017   Positve IgG in titer on 11/20/17  . S/P cardiac cath 1998   a. no obstructive disease  . Syncope and collapse   . Temporary low platelet count ([place])     Surgical History:  Past Surgical History:  Procedure Laterality Date  . ANAL FISSURE REPAIR    . BACK SURGERY  1977   rupture disc lumbar spine  . [facility] with [facility].   Marland Kitchen CATARACT EXTRACTION W/PHACO Right 07/16/2016   Procedure: CATARACT EXTRACTION PHACO AND INTRAOCULAR LENS PLACEMENT (IOC);  Surgeon: [name], MD;  Location: [facility] ORS;  Service: Ophthalmology;  Laterality: Right;  [place] 01:11 AP% 17.6 CDE 24.83 fluid pack lot # [id number]  . CATARACT EXTRACTION W/PHACO Left 08/13/2016   Procedure: CATARACT EXTRACTION PHACO AND INTRAOCULAR LENS PLACEMENT ([place]) suture placed in left eye at end of procedure;  Surgeon: [name], MD;  Location: [facility] ORS;  Service: Ophthalmology;  Laterality: Left;  [place] 01:55 AP% 22.7 CDE 53.56 fluid pack lot # [id number]  . FINGER AMPUTATION     partial  . KNEE SURGERY  1993   arthroscopy  . SHOULDER SURGERY Bilateral 1998   arthroscopic right, rotator cuff repair left    Home Medications:  Allergies as of 11/09/2018      Reactions   Bee Venom Swelling   Dye Fdc Red [red Dye] Swelling   Atenolol Other (See Comments)   Did not regulate blood pressure   Benadryl [diphenhydramine Hcl (sleep)] Other (See Comments)   Hyperactivity   Enalapril Itching  itching   Gabapentin Palpitations, Rash   rash   Hctz [hydrochlorothiazide] Other (See Comments)   Decreased potassium      Medication List       Accurate as of November 09, 2018  9:22 PM. If you have any questions, ask your nurse or doctor.        allopurinol 300 MG tablet Commonly known as: ZYLOPRIM TAKE 1 TABLET(300 MG) BY MOUTH DAILY   aspirin 81 MG tablet Take 1 tablet (81 mg total) by mouth daily.   calcitRIOL 0.25 MCG capsule Commonly known as: ROCALTROL TK 1 C PO QD   doxazosin 8 MG tablet Commonly known as: CARDURA TAKE 1 TABLET(8 MG) BY MOUTH AT BEDTIME   DULoxetine 60 MG capsule Commonly known as: CYMBALTA TAKE 1  CAPSULE(60 MG) BY MOUTH DAILY   esomeprazole 40 MG capsule Commonly known as: NexIUM Take 1 capsule (40 mg total) by mouth daily at 12 noon.   finasteride 5 MG tablet Commonly known as: PROSCAR Take 1 tablet (5 mg total) by mouth daily.   fluticasone 50 MCG/ACT nasal spray Commonly known as: FLONASE Place 2 sprays into both nostrils daily.   FreeStyle [place] Lite w/Device Kit Use as directed   furosemide 40 MG tablet Commonly known as: LASIX TAKE 1 TABLET(40 MG) BY MOUTH DAILY   glucose blood test strip Commonly known as: FREESTYLE LITE CHECK BLOOD SUGAR TWICE DAILY   HYDROcodone-acetaminophen 5-325 MG tablet Commonly known as: NORCO/VICODIN Take 1 tablet by mouth every 6 (six) hours as needed for moderate pain.   Insulin Glargine 100 UNIT/ML Solostar Pen Commonly known as: Lantus SoloStar Inject 48 units into the skin subcutaneously once daily.   levothyroxine 100 MCG tablet Commonly known as: SYNTHROID Take 1 tablet (100 mcg total) by mouth daily before breakfast.   loratadine 10 MG tablet Commonly known as: CLARITIN Take 10 mg by mouth daily as needed.   losartan 25 MG tablet Commonly known as: COZAAR TAKE 1 TABLET BY MOUTH TWICE DAILY   Lyrica 100 MG capsule Generic drug: pregabalin Take 1 capsule (100 mg total) by mouth 3 (three) times daily.   Magnesium 250 MG Tabs Take 1 tablet by mouth daily.   metFORMIN 500 MG 24 hr tablet Commonly known as: GLUCOPHAGE-XR TAKE 1 TABLET(500 MG) BY MOUTH TWICE DAILY   Pen Needles 32G X 5 MM Misc 1 pen by Does not apply route daily.   sildenafil 20 MG tablet Commonly known as: REVATIO 2-3 tabs 1 hour prior to intercourse   simvastatin 40 MG tablet Commonly known as: ZOCOR Take 1 tablet (40 mg total) by mouth at bedtime.   SM Vitamin D3 50 MCG (2000 UT) Caps Generic drug: Cholecalciferol Take 8,000 Units by mouth daily.   vitamin B-12 250 MCG tablet Commonly known as: CYANOCOBALAMIN Take 250 mcg by mouth  daily.       Allergies:  Allergies  Allergen Reactions  . Bee Venom Swelling  . Dye Fdc Red [Red Dye] Swelling  . Atenolol Other (See Comments)    Did not regulate blood pressure  . Benadryl [Diphenhydramine Hcl (Sleep)] Other (See Comments)    Hyperactivity   . Enalapril Itching    itching  . Gabapentin Palpitations and Rash    rash  . Hctz [Hydrochlorothiazide] Other (See Comments)    Decreased potassium    Family History: Family History  Problem Relation Age of Onset  . Breast cancer Mother   . Lung cancer Mother   . Bone cancer  Mother   . Heart Problems Mother   . Cancer Mother        breast, lung and rib  . AAA (abdominal aortic aneurysm) Mother   . Heart disease Mother   . Heart attack Father   . Heart disease Father   . Cancer Brother        esophageal  . Heart attack Brother   . Colon cancer Neg Hx     Social History:  reports that he has never smoked. He has never used smokeless tobacco. He reports that he does not drink alcohol or use drugs.  ROS: UROLOGY Frequent Urination?: No Hard to postpone urination?: No Burning/pain with urination?: No Get up at night to urinate?: No Leakage of urine?: No Urine stream starts and stops?: No Trouble starting stream?: No Do you have to strain to urinate?: No Blood in urine?: No Urinary tract infection?: No Sexually transmitted disease?: No Injury to kidneys or bladder?: No Painful intercourse?: No Weak stream?: No Erection problems?: Yes Penile pain?: No  Gastrointestinal Nausea?: No Vomiting?: No Indigestion/heartburn?: Yes Diarrhea?: No Constipation?: No  Constitutional Fever: No Night sweats?: No Weight loss?: No Fatigue?: Yes  Skin Skin rash/lesions?: No Itching?: No  Eyes Blurred vision?: No Double vision?: No  Ears/Nose/Throat Sore throat?: No Sinus problems?: No  Hematologic/Lymphatic Swollen glands?: No Easy bruising?: No  Cardiovascular Leg swelling?: Yes Chest pain?:  No  Respiratory Cough?: No Shortness of breath?: No  Endocrine Excessive thirst?: No  Musculoskeletal Back pain?: Yes Joint pain?: Yes  Neurological Headaches?: No Dizziness?: No  Psychologic Depression?: Yes Anxiety?: Yes  Physical Exam: BP 137/73 (BP Location: Left Arm, Patient Position: Sitting, Cuff Size: Large)   Pulse 89   Ht _0  (1.803 m)   Wt 269 lb 1.6 oz (122.1 kg)   BMI 37.53 kg/m   Constitutional:  Alert and oriented, No acute distress. HEENT: [place] AT, moist mucus membranes.  Trachea midline, no masses. Cardiovascular: No clubbing, cyanosis, or edema. Respiratory: Normal respiratory effort, no increased work of breathing. GI: Abdomen is soft, nontender, nondistended, no abdominal masses GU: No CVA tenderness Lymph: No cervical or inguinal lymphadenopathy. Skin: No rashes, bruises or suspicious lesions. Neurologic: Grossly intact, no focal deficits, moving all 4 extremities. Psychiatric: Normal mood and affect.   Assessment & Plan:    BPH with lower urinary tract symptoms He has stable voiding symptoms on combination therapy which he will continue.  He states he did not need an Rx at this time.  PVR by bladder scan today was 75 mL.  Erectile dysfunction I discussed other options including intracavernosal injections and vacuum erection devices.  He does not desire to pursue other options at this time.  Return in about 1 year (around 11/09/2019) for Recheck.   Abbie Sons, [place] [address], [place], [place] [address] [phone]

## 2018-11-09 NOTE — Patient Instructions (Signed)

## 2018-11-10 ENCOUNTER — Other Ambulatory Visit: Payer: Self-pay | Admitting: *Deleted

## 2018-11-10 DIAGNOSIS — N401 Enlarged prostate with lower urinary tract symptoms: Secondary | ICD-10-CM

## 2018-11-10 DIAGNOSIS — R3912 Poor urinary stream: Secondary | ICD-10-CM

## 2018-11-10 MED ORDER — FINASTERIDE 5 MG PO TABS: 5.0000 mg | ORAL_TABLET | Freq: Every day | ORAL | 0 refills | Status: DC

## 2018-11-12 ENCOUNTER — Other Ambulatory Visit: Payer: Self-pay | Admitting: Family Medicine

## 2018-11-12 DIAGNOSIS — M1A3790 Chronic gout due to renal impairment, unspecified ankle and foot, without tophus (tophi): Secondary | ICD-10-CM | POA: Diagnosis not present

## 2018-11-12 DIAGNOSIS — E1142 Type 2 diabetes mellitus with diabetic polyneuropathy: Secondary | ICD-10-CM | POA: Diagnosis not present

## 2018-11-12 DIAGNOSIS — E785 Hyperlipidemia, unspecified: Secondary | ICD-10-CM | POA: Diagnosis not present

## 2018-11-13 LAB — LIPID PANEL
Chol/HDL Ratio: 2.9 ratio (ref 0.0–5.0)
Cholesterol, Total: 142 mg/dL (ref 100–199)
HDL: 49 mg/dL (ref >39)
LDL Calculated: 58 mg/dL (ref 0–99)
Triglycerides: 174 mg/dL — ABNORMAL HIGH (ref 0–149)
VLDL Cholesterol Cal: 35 mg/dL (ref 5–40)

## 2018-11-13 LAB — URINE DRUGS OF ABUSE SCREEN W ALC, ROUTINE (REF LAB)
Amphetamines, Urine: NEGATIVE ng/mL
Barbiturate Quant, Ur: NEGATIVE ng/mL
Benzodiazepine Quant, Ur: NEGATIVE ng/mL
Cannabinoid Quant, Ur: NEGATIVE ng/mL
Cocaine (Metab.): NEGATIVE ng/mL
Ethanol, Urine: NEGATIVE %
Methadone Screen, Urine: NEGATIVE ng/mL
Opiate Quant, Ur: NEGATIVE ng/mL
PCP Quant, Ur: NEGATIVE ng/mL
Propoxyphene: NEGATIVE ng/mL

## 2018-11-13 LAB — HEMOGLOBIN A1C
Est. average glucose Bld gHb Est-mCnc: 169 mg/dL
Hgb A1c MFr Bld: 7.5 % — ABNORMAL HIGH (ref 4.8–5.6)

## 2018-11-13 LAB — COMPREHENSIVE METABOLIC PANEL
ALT: 23 IU/L (ref 0–44)
AST: 17 IU/L (ref 0–40)
Albumin/Globulin Ratio: 2.2 (ref 1.2–2.2)
Albumin: 4.3 g/dL (ref 3.7–4.7)
Alkaline Phosphatase: 75 IU/L (ref 39–117)
BUN/Creatinine Ratio: 8 — ABNORMAL LOW (ref 10–24)
BUN: 13 mg/dL (ref 8–27)
Bilirubin Total: 0.6 mg/dL (ref 0.0–1.2)
CO2: 30 mmol/L — ABNORMAL HIGH (ref 20–29)
Calcium: 9.6 mg/dL (ref 8.6–10.2)
Chloride: 94 mmol/L — ABNORMAL LOW (ref 96–106)
Creatinine, Ser: 1.54 mg/dL — ABNORMAL HIGH (ref 0.76–1.27)
GFR calc Af Amer: 51 mL/min/{1.73_m2} — ABNORMAL LOW (ref >59)
GFR calc non Af Amer: 44 mL/min/{1.73_m2} — ABNORMAL LOW (ref >59)
Globulin, Total: 2 g/dL (ref 1.5–4.5)
Glucose: 128 mg/dL — ABNORMAL HIGH (ref 65–99)
Potassium: 4.4 mmol/L (ref 3.5–5.2)
Sodium: 140 mmol/L (ref 134–144)
Total Protein: 6.3 g/dL (ref 6.0–8.5)

## 2018-11-13 LAB — URIC ACID: Uric Acid: 5 mg/dL (ref 3.7–8.6)

## 2018-11-17 ENCOUNTER — Other Ambulatory Visit: Payer: Self-pay

## 2018-11-17 DIAGNOSIS — N5201 Erectile dysfunction due to arterial insufficiency: Secondary | ICD-10-CM

## 2018-11-17 MED ORDER — SILDENAFIL CITRATE 20 MG PO TABS: ORAL_TABLET | ORAL | 6 refills | Status: DC

## 2018-11-18 ENCOUNTER — Encounter: Payer: Self-pay | Admitting: Lab

## 2018-11-18 ENCOUNTER — Other Ambulatory Visit: Payer: Self-pay | Admitting: Family Medicine

## 2018-11-22 ENCOUNTER — Other Ambulatory Visit: Payer: Self-pay | Admitting: Family Medicine

## 2018-11-22 DIAGNOSIS — E1142 Type 2 diabetes mellitus with diabetic polyneuropathy: Secondary | ICD-10-CM

## 2018-12-10 ENCOUNTER — Other Ambulatory Visit: Payer: Self-pay

## 2018-12-10 ENCOUNTER — Encounter: Payer: Self-pay | Admitting: Emergency Medicine

## 2018-12-10 ENCOUNTER — Ambulatory Visit: Payer: Medicare Other

## 2018-12-10 ENCOUNTER — Ambulatory Visit
Admission: EM | Admit: 2018-12-10 | Discharge: 2018-12-10 | Disposition: A | Discharge status: Home / Self Care | Payer: Medicare Other | Attending: Family Medicine | Admitting: Family Medicine

## 2018-12-10 DIAGNOSIS — E785 Hyperlipidemia, unspecified: Secondary | ICD-10-CM | POA: Insufficient documentation

## 2018-12-10 DIAGNOSIS — I129 Hypertensive chronic kidney disease with stage 1 through stage 4 chronic kidney disease, or unspecified chronic kidney disease: Secondary | ICD-10-CM | POA: Insufficient documentation

## 2018-12-10 DIAGNOSIS — Z79890 Hormone replacement therapy (postmenopausal): Secondary | ICD-10-CM | POA: Diagnosis not present

## 2018-12-10 DIAGNOSIS — Z8249 Family history of ischemic heart disease and other diseases of the circulatory system: Secondary | ICD-10-CM | POA: Insufficient documentation

## 2018-12-10 DIAGNOSIS — W19XXXA Unspecified fall, initial encounter: Secondary | ICD-10-CM | POA: Insufficient documentation

## 2018-12-10 DIAGNOSIS — M1711 Unilateral primary osteoarthritis, right knee: Secondary | ICD-10-CM | POA: Insufficient documentation

## 2018-12-10 DIAGNOSIS — N189 Chronic kidney disease, unspecified: Secondary | ICD-10-CM | POA: Insufficient documentation

## 2018-12-10 DIAGNOSIS — M25461 Effusion, right knee: Secondary | ICD-10-CM | POA: Diagnosis not present

## 2018-12-10 DIAGNOSIS — E1122 Type 2 diabetes mellitus with diabetic chronic kidney disease: Secondary | ICD-10-CM | POA: Insufficient documentation

## 2018-12-10 DIAGNOSIS — E039 Hypothyroidism, unspecified: Secondary | ICD-10-CM | POA: Diagnosis not present

## 2018-12-10 DIAGNOSIS — Z7982 Long term (current) use of aspirin: Secondary | ICD-10-CM | POA: Insufficient documentation

## 2018-12-10 DIAGNOSIS — K219 Gastro-esophageal reflux disease without esophagitis: Secondary | ICD-10-CM | POA: Insufficient documentation

## 2018-12-10 DIAGNOSIS — M25561 Pain in right knee: Secondary | ICD-10-CM | POA: Insufficient documentation

## 2018-12-10 DIAGNOSIS — Z79899 Other long term (current) drug therapy: Secondary | ICD-10-CM | POA: Diagnosis not present

## 2018-12-10 DIAGNOSIS — Z794 Long term (current) use of insulin: Secondary | ICD-10-CM | POA: Diagnosis not present

## 2018-12-10 DIAGNOSIS — Z87442 Personal history of urinary calculi: Secondary | ICD-10-CM | POA: Insufficient documentation

## 2018-12-10 DIAGNOSIS — G4733 Obstructive sleep apnea (adult) (pediatric): Secondary | ICD-10-CM | POA: Insufficient documentation

## 2018-12-10 DIAGNOSIS — E1142 Type 2 diabetes mellitus with diabetic polyneuropathy: Secondary | ICD-10-CM | POA: Insufficient documentation

## 2018-12-10 DIAGNOSIS — S8991XA Unspecified injury of right lower leg, initial encounter: Secondary | ICD-10-CM | POA: Diagnosis not present

## 2018-12-10 NOTE — ED Triage Notes (Signed)
Patient states that he fell while walking his dog yesterday afternoon.  Patient states that he felt dizzy and there was nothing to hold on to.  Patient states that he fell on his right knee.  Patient c/o pain in his right knee that started the next day.

## 2018-12-10 NOTE — Discharge Instructions (Signed)
Rest, ice, elevation.  Pain medication as needed.  Take care  Dr. Lacinda Axon

## 2018-12-10 NOTE — ED Provider Notes (Signed)
[facility]    CSN: [id number] Arrival date & time: 12/10/18  1034   History   Chief Complaint Chief Complaint  Patient presents with  . Knee Pain    right  . Fall   HPI  73 year old male presents with the above complaint.  Patient reports that he fell yesterday afternoon.  He states that he got dizzy and subsequently fell directly on his knees.  He has an abrasion to his left knee.  He reports severe pain and swelling of his right knee.  Pain is currently 8/10 in severity.  He states that he is unable to bear weight.  He has known osteoarthritis.  Exacerbated by activity.  No relieving factors.  No other reported symptoms.  No other complaints at this time.  PMH, Surgical Hx, Family Hx, Social History reviewed and updated as below.  Past Medical History:  Diagnosis Date  . Arthritis   . Depression   . Diabetes mellitus with complication ([place])   . Diabetic neuropathy ([place])   . Diastolic dysfunction    a. TTE 07/2017: EF 60-65%, mild concentric LVH, no RWMA, Gr1DD, trivial AI, mildly dilated LA, RVSF normal, PASP normal  . Edema    feet/legs  . GERD (gastroesophageal reflux disease)   . Gout   . History of stress test    a. MV 06/2017: small in size, mild in severity, apical anterior and apical defect that was minimally reversible and most likely represented artifact and less likely ischemia/scar, LVEF 55-65%, low risk, probably normal stress test  . Hypertension   . Hypothyroidism   . Kidney cysts    renal failure 2013  . Kidney stones    Dr. Rogers Blocker  . OSA (obstructive sleep apnea)    supplemental oxygen at night  . Oxygen dependent    hs  . Rocky Mountain spotted fever 12/10/2017   Positve IgG in titer on 11/20/17  . S/P cardiac cath 1998   a. no obstructive disease  . Syncope and collapse   . Temporary low platelet count [facility])     Patient Active Problem List   Diagnosis Date Noted  . Erectile dysfunction due to arterial insufficiency 11/09/2018  .  Respiratory illness 11/05/2018  . Tick bite of knee, initial encounter 08/17/2018  . Left shoulder pain 07/02/2018  . Allergic rhinitis 07/02/2018  . Cervical radiculopathy 11/20/2017  . Toe pain, bilateral 11/20/2017  . Abdominal pain 02/18/2017  . Clavicle enlargement 02/18/2017  . Anxiety 02/18/2017  . Degenerative arthritis of knee, bilateral 09/04/2016  . BPH (benign prostatic hyperplasia) 08/18/2016  . Knee osteoarthritis 08/18/2016  . Hyperlipidemia 05/20/2016  . Essential hypertension 05/31/2015  . Low testosterone 02/27/2015  . Diabetic neuropathy ([place]) 01/05/2013  . Obesity (BMI 30-39.9) 01/05/2013  . Chronic back pain greater than 3 months duration 01/05/2013  . Gout 01/05/2013  . OSA (obstructive sleep apnea) 01/05/2013  . Chronic kidney disease 01/04/2013  . DM type 2 with diabetic peripheral neuropathy ([place]) 01/04/2013  . Hypothyroidism 01/04/2013    Past Surgical History:  Procedure Laterality Date  . ANAL FISSURE REPAIR    . BACK SURGERY  1977   rupture disc lumbar spine  . [facility] with [facility].   Marland Kitchen CATARACT EXTRACTION W/PHACO Right 07/16/2016   Procedure: CATARACT EXTRACTION PHACO AND INTRAOCULAR LENS PLACEMENT (IOC);  Surgeon: [name], MD;  Location: [facility] ORS;  Service: Ophthalmology;  Laterality: Right;  [place] 01:11 AP% 17.6 CDE 24.83  fluid pack lot # [id number]  . CATARACT EXTRACTION W/PHACO Left 08/13/2016   Procedure: CATARACT EXTRACTION PHACO AND INTRAOCULAR LENS PLACEMENT (IOC) suture placed in left eye at end of procedure;  Surgeon: [name], MD;  Location: [facility] ORS;  Service: Ophthalmology;  Laterality: Left;  [place] 01:55 AP% 22.7 CDE 53.56 fluid pack lot # [id number]  . FINGER AMPUTATION     partial  . KNEE SURGERY  1993   arthroscopy  . SHOULDER SURGERY Bilateral 1998   arthroscopic right, rotator cuff repair left       Home Medications    Prior to Admission  medications   Medication Sig Start Date End Date Taking? Authorizing Provider  allopurinol (ZYLOPRIM) 300 MG tablet TAKE 1 TABLET(300 MG) BY MOUTH DAILY 11/18/18  Yes [name], MD  aspirin 81 MG tablet Take 1 tablet (81 mg total) by mouth daily. 11/19/17  Yes Wellington Hampshire, MD  calcitRIOL (ROCALTROL) 0.25 MCG capsule TK 1 C PO QD 06/22/17  Yes [provider]  doxazosin (CARDURA) 8 MG tablet TAKE 1 TABLET(8 MG) BY MOUTH AT BEDTIME 10/11/18  Yes [name], MD  DULoxetine (CYMBALTA) 60 MG capsule TAKE 1 CAPSULE(60 MG) BY MOUTH DAILY 10/19/18  Yes [name], MD  esomeprazole (NEXIUM) 40 MG capsule Take 1 capsule (40 mg total) by mouth daily at 12 noon. 08/04/13  Yes Jackolyn Confer, MD  finasteride (PROSCAR) 5 MG tablet Take 1 tablet (5 mg total) by mouth daily. 11/10/18  Yes Stoioff, Ronda Fairly, MD  fluticasone (FLONASE) 50 MCG/ACT nasal spray Place 2 sprays into both nostrils daily. 08/16/18  Yes [name], MD  furosemide (LASIX) 40 MG tablet TAKE 1 TABLET(40 MG) BY MOUTH DAILY 11/12/18  Yes [name], MD  HYDROcodone-acetaminophen (NORCO/VICODIN) 5-325 MG tablet Take 1 tablet by mouth every 6 (six) hours as needed for moderate pain. 11/05/18  Yes [name], MD  Insulin Glargine (LANTUS SOLOSTAR) 100 UNIT/ML Solostar Pen Inject 48 units into the skin subcutaneously once daily. 06/04/18  Yes [name], MD  levothyroxine (SYNTHROID, LEVOTHROID) 100 MCG tablet Take 1 tablet (100 mcg total) by mouth daily before breakfast. 08/31/17  Yes [name], MD  loratadine (CLARITIN) 10 MG tablet Take 10 mg by mouth daily as needed.    Yes [provider]  losartan (COZAAR) 25 MG tablet TAKE 1 TABLET BY MOUTH TWICE DAILY 11/23/18  Yes [name], MD  LYRICA 100 MG capsule Take 1 capsule (100 mg total) by mouth 3 (three) times daily. 11/06/17  Yes [name], MD  Magnesium 250 MG TABS Take 1 tablet by mouth daily.    Yes  [provider]  metFORMIN (GLUCOPHAGE-XR) 500 MG 24 hr tablet TAKE 1 TABLET(500 MG) BY MOUTH TWICE DAILY 04/05/18  Yes [name], MD  sildenafil (REVATIO) 20 MG tablet 2-3 tabs 1 hour prior to intercourse 11/17/18  Yes Stoioff, Ronda Fairly, MD  simvastatin (ZOCOR) 40 MG tablet Take 1 tablet (40 mg total) by mouth at bedtime. 10/20/17  Yes [name], MD  vitamin B-12 (CYANOCOBALAMIN) 250 MCG tablet Take 250 mcg by mouth daily.    Yes [provider]  Blood Glucose Monitoring Suppl (FREESTYLE FREEDOM LITE) W/DEVICE KIT Use as directed 09/13/13   Jackolyn Confer, MD  Cholecalciferol (SM VITAMIN D3) 2000 units CAPS Take 8,000 Units by mouth daily.    [provider]  glucose blood (FREESTYLE LITE) test strip CHECK  BLOOD SUGAR TWICE DAILY 09/16/18   [name], MD  Insulin Pen Needle (PEN NEEDLES) 32G X 5 MM MISC 1 pen by Does not apply route daily. 11/05/18   [name], MD    Family History Family History  Problem Relation Age of Onset  . Breast cancer Mother   . Lung cancer Mother   . Bone cancer Mother   . Heart Problems Mother   . Cancer Mother        breast, lung and rib  . AAA (abdominal aortic aneurysm) Mother   . Heart disease Mother   . Heart attack Father   . Heart disease Father   . Cancer Brother        esophageal  . Heart attack Brother   . Colon cancer Neg Hx     Social History Social History   Tobacco Use  . Smoking status: Never Smoker  . Smokeless tobacco: Never Used  Substance Use Topics  . Alcohol use: No  . Drug use: No     Allergies   Bee venom, Dye fdc red [red dye], Atenolol, Benadryl [diphenhydramine hcl (sleep)], Enalapril, Gabapentin, and Hctz [hydrochlorothiazide]   Review of Systems Review of Systems  Constitutional: Negative.   Musculoskeletal:       Right knee pain, swelling.  Neurological: Positive for dizziness.   Physical Exam Triage Vital Signs ED Triage Vitals  Enc Vitals Group      BP 12/10/18 1056 106/67     Pulse Rate 12/10/18 1056 88     Resp 12/10/18 1056 16     Temp 12/10/18 1056 98.8 F (37.1 C)     Temp Source 12/10/18 1056 Oral     SpO2 12/10/18 1056 98 %     Weight 12/10/18 1051 265 lb (120.2 kg)     Height 12/10/18 1051 _0  (1.803 m)     Head Circumference --      Peak Flow --      Pain Score 12/10/18 1050 8     Pain Loc --      Pain Edu? --      Excl. in [place]? --    Updated Vital Signs BP 106/67 (BP Location: Left Arm)   Pulse 88   Temp 98.8 F (37.1 C) (Oral)   Resp 16   Ht _1  (1.803 m)   Wt 120.2 kg   SpO2 98%   BMI 36.96 kg/m   Visual Acuity Right Eye Distance:   Left Eye Distance:   Bilateral Distance:    Right Eye Near:   Left Eye Near:    Bilateral Near:     Physical Exam Vitals signs and nursing note reviewed.  Constitutional:      General: He is not in acute distress.    Appearance: Normal appearance.  HENT:     Head: Normocephalic and atraumatic.  Eyes:     General:        Right eye: No discharge.        Left eye: No discharge.     Conjunctiva/sclera: Conjunctivae normal.  Cardiovascular:     Rate and Rhythm: Normal rate and regular rhythm.  Pulmonary:     Effort: Pulmonary effort is normal.     Breath sounds: Normal breath sounds. No wheezing, rhonchi or rales.  Musculoskeletal:     Comments: Right knee -effusion noted.  Joint line tenderness noted.  Decreased range of motion secondary to pain.  Skin:    Comments: Abrasion noted  to the left knee.  Neurological:     Mental Status: He is alert.  Psychiatric:        Mood and Affect: Mood normal.        Behavior: Behavior normal.    UC Treatments / Results  Labs (all labs ordered are listed, but only abnormal results are displayed) Labs Reviewed - No data to display  EKG   Radiology Dg Knee Complete 4 Views Right  Result Date: 12/10/2018 CLINICAL DATA:  73 year old who fell yesterday and injured the RIGHT knee. Initial encounter. EXAM: RIGHT  KNEE - COMPLETE 4+ VIEW COMPARISON:  None. FINDINGS: No evidence of acute fracture or dislocation. Severe narrowing of the MEDIAL compartment joint space with associated spurring. Mild patellofemoral and LATERAL compartment joint space narrowing. Well-preserved bone mineral density. Moderate-sized joint effusion. IMPRESSION: 1. No acute osseous abnormality. 2. Severe osteoarthritis involving the MEDIAL compartment. 3. Moderate-sized joint effusion. Electronically Signed   By: Evangeline Dakin M.D.   On: 12/10/2018 11:51    Procedures Procedures (including critical care time)  Medications Ordered in UC Medications - No data to display  Initial Impression / Assessment and Plan / UC Course  I have reviewed the triage vital signs and the nursing notes.  Pertinent labs & imaging results that were available during my care of the patient were reviewed by me and considered in my medical decision making (see chart for details).    73 year old male presents with fall and injury of his right knee.  X-ray with no acute fracture.  Advised rest, ice, elevation.  Offered pain medication and patient declined.  Supportive care.  Final Clinical Impressions(s) / UC Diagnoses   Final diagnoses:  Acute pain of right knee  Fall, initial encounter  Primary osteoarthritis of right knee     Discharge Instructions     Rest, ice, elevation.  Pain medication as needed.  Take care  Dr. Lacinda Axon    ED Prescriptions    None     Controlled Substance Prescriptions [place] Controlled Substance Registry consulted? Not Applicable   Coral Spikes, DO 12/10/18 1250

## 2018-12-13 ENCOUNTER — Telehealth: Payer: Self-pay | Admitting: Family Medicine

## 2018-12-13 NOTE — Telephone Encounter (Signed)
Pt mentioned that he had a fall and that Dr Elliot Cousin gave him half dose of the hydrocodone. Pt states that Dr Elliot Cousin ordered a urine test before releasing the other half of the medication. Pt would like to know the results of the urine so he can get the other half of the medication. Please advise? Thank you!  Call pt @ [phone].

## 2018-12-13 NOTE — Telephone Encounter (Signed)
Pt mentioned that he had a fall and that you  gave him half dose of the hydrocodone. Pt states that you ordered a urine test before releasing the other half of the medication. Pt would like to know the results of the urine so he can get the other half of the medication.  Torie Priebe,cma.

## 2018-12-14 MED ORDER — HYDROCODONE-ACETAMINOPHEN 5-325 MG PO TABS: 1.0000 | ORAL_TABLET | Freq: Four times a day (QID) | ORAL | 0 refills | Status: DC | PRN

## 2018-12-14 NOTE — Telephone Encounter (Signed)
Called and left a message on voicemail that his Rx was sent to pharmacy.  Kaitlynn Tramontana,cma

## 2018-12-14 NOTE — Telephone Encounter (Signed)
His urine drug screen was negative.  I will send in an additional refill for him.

## 2018-12-14 NOTE — Addendum Note (Signed)
Addended by: [name] on: 12/14/2018 02:45 PM   Modules accepted: Orders

## 2018-12-15 ENCOUNTER — Other Ambulatory Visit: Payer: Self-pay | Admitting: Family Medicine

## 2018-12-21 ENCOUNTER — Encounter: Payer: Self-pay | Admitting: *Deleted

## 2019-01-12 DIAGNOSIS — M47812 Spondylosis without myelopathy or radiculopathy, cervical region: Secondary | ICD-10-CM | POA: Diagnosis not present

## 2019-01-12 DIAGNOSIS — M5416 Radiculopathy, lumbar region: Secondary | ICD-10-CM | POA: Diagnosis not present

## 2019-01-12 DIAGNOSIS — E11610 Type 2 diabetes mellitus with diabetic neuropathic arthropathy: Secondary | ICD-10-CM | POA: Diagnosis not present

## 2019-01-15 ENCOUNTER — Other Ambulatory Visit: Payer: Self-pay | Admitting: Family Medicine

## 2019-01-17 ENCOUNTER — Other Ambulatory Visit: Payer: Self-pay | Admitting: Family Medicine

## 2019-01-19 ENCOUNTER — Emergency Department: Payer: Medicare Other

## 2019-01-19 ENCOUNTER — Other Ambulatory Visit: Payer: Self-pay

## 2019-01-19 ENCOUNTER — Encounter: Payer: Self-pay | Admitting: *Deleted

## 2019-01-19 ENCOUNTER — Observation Stay
Admission: EM | Admit: 2019-01-19 | Discharge: 2019-01-20 | Disposition: A | Discharge status: Home / Self Care | Payer: Medicare Other | Attending: Internal Medicine | Admitting: Internal Medicine

## 2019-01-19 ENCOUNTER — Other Ambulatory Visit: Payer: Self-pay | Admitting: Family Medicine

## 2019-01-19 DIAGNOSIS — E119 Type 2 diabetes mellitus without complications: Secondary | ICD-10-CM | POA: Diagnosis not present

## 2019-01-19 DIAGNOSIS — N184 Chronic kidney disease, stage 4 (severe): Secondary | ICD-10-CM | POA: Diagnosis not present

## 2019-01-19 DIAGNOSIS — K219 Gastro-esophageal reflux disease without esophagitis: Secondary | ICD-10-CM | POA: Insufficient documentation

## 2019-01-19 DIAGNOSIS — M1990 Unspecified osteoarthritis, unspecified site: Secondary | ICD-10-CM | POA: Diagnosis not present

## 2019-01-19 DIAGNOSIS — Z7982 Long term (current) use of aspirin: Secondary | ICD-10-CM | POA: Diagnosis not present

## 2019-01-19 DIAGNOSIS — Z79890 Hormone replacement therapy (postmenopausal): Secondary | ICD-10-CM | POA: Diagnosis not present

## 2019-01-19 DIAGNOSIS — E039 Hypothyroidism, unspecified: Secondary | ICD-10-CM | POA: Diagnosis not present

## 2019-01-19 DIAGNOSIS — Z888 Allergy status to other drugs, medicaments and biological substances status: Secondary | ICD-10-CM | POA: Insufficient documentation

## 2019-01-19 DIAGNOSIS — Z79899 Other long term (current) drug therapy: Secondary | ICD-10-CM | POA: Insufficient documentation

## 2019-01-19 DIAGNOSIS — F329 Major depressive disorder, single episode, unspecified: Secondary | ICD-10-CM | POA: Insufficient documentation

## 2019-01-19 DIAGNOSIS — R079 Chest pain, unspecified: Secondary | ICD-10-CM | POA: Diagnosis not present

## 2019-01-19 DIAGNOSIS — N2889 Other specified disorders of kidney and ureter: Secondary | ICD-10-CM | POA: Diagnosis not present

## 2019-01-19 DIAGNOSIS — Z794 Long term (current) use of insulin: Secondary | ICD-10-CM | POA: Insufficient documentation

## 2019-01-19 DIAGNOSIS — M109 Gout, unspecified: Secondary | ICD-10-CM | POA: Diagnosis not present

## 2019-01-19 DIAGNOSIS — I129 Hypertensive chronic kidney disease with stage 1 through stage 4 chronic kidney disease, or unspecified chronic kidney disease: Secondary | ICD-10-CM | POA: Insufficient documentation

## 2019-01-19 DIAGNOSIS — Z8249 Family history of ischemic heart disease and other diseases of the circulatory system: Secondary | ICD-10-CM | POA: Insufficient documentation

## 2019-01-19 DIAGNOSIS — Z20828 Contact with and (suspected) exposure to other viral communicable diseases: Secondary | ICD-10-CM | POA: Diagnosis not present

## 2019-01-19 DIAGNOSIS — E1140 Type 2 diabetes mellitus with diabetic neuropathy, unspecified: Secondary | ICD-10-CM | POA: Insufficient documentation

## 2019-01-19 DIAGNOSIS — Z9981 Dependence on supplemental oxygen: Secondary | ICD-10-CM | POA: Insufficient documentation

## 2019-01-19 DIAGNOSIS — I509 Heart failure, unspecified: Secondary | ICD-10-CM | POA: Diagnosis not present

## 2019-01-19 DIAGNOSIS — I951 Orthostatic hypotension: Secondary | ICD-10-CM | POA: Diagnosis not present

## 2019-01-19 DIAGNOSIS — N400 Enlarged prostate without lower urinary tract symptoms: Secondary | ICD-10-CM | POA: Diagnosis not present

## 2019-01-19 DIAGNOSIS — G4733 Obstructive sleep apnea (adult) (pediatric): Secondary | ICD-10-CM | POA: Diagnosis not present

## 2019-01-19 DIAGNOSIS — R0789 Other chest pain: Secondary | ICD-10-CM | POA: Diagnosis not present

## 2019-01-19 DIAGNOSIS — E1122 Type 2 diabetes mellitus with diabetic chronic kidney disease: Secondary | ICD-10-CM | POA: Diagnosis not present

## 2019-01-19 DIAGNOSIS — R42 Dizziness and giddiness: Secondary | ICD-10-CM | POA: Diagnosis not present

## 2019-01-19 LAB — BASIC METABOLIC PANEL
Anion gap: 13 (ref 5–15)
BUN: 15 mg/dL (ref 8–23)
CO2: 25 mmol/L (ref 22–32)
Calcium: 9.1 mg/dL (ref 8.9–10.3)
Chloride: 98 mmol/L (ref 98–111)
Creatinine, Ser: 1.49 mg/dL — ABNORMAL HIGH (ref 0.61–1.24)
GFR calc Af Amer: 53 mL/min — ABNORMAL LOW (ref >60)
GFR calc non Af Amer: 46 mL/min — ABNORMAL LOW (ref >60)
Glucose, Bld: 140 mg/dL — ABNORMAL HIGH (ref 70–99)
Potassium: 3.2 mmol/L — ABNORMAL LOW (ref 3.5–5.1)
Sodium: 136 mmol/L (ref 135–145)

## 2019-01-19 LAB — CBC
HCT: 36.6 % — ABNORMAL LOW (ref 39.0–52.0)
Hemoglobin: 11.8 g/dL — ABNORMAL LOW (ref 13.0–17.0)
MCH: 28.8 pg (ref 26.0–34.0)
MCHC: 32.2 g/dL (ref 30.0–36.0)
MCV: 89.3 fL (ref 80.0–100.0)
Platelets: 258 10*3/uL (ref 150–400)
RBC: 4.1 MIL/uL — ABNORMAL LOW (ref 4.22–5.81)
RDW: 14.5 % (ref 11.5–15.5)
WBC: 10.6 10*3/uL — ABNORMAL HIGH (ref 4.0–10.5)
nRBC: 0 % (ref 0.0–0.2)

## 2019-01-19 LAB — TROPONIN I (HIGH SENSITIVITY): Troponin I (High Sensitivity): 16 ng/L (ref <18)

## 2019-01-19 MED ORDER — IOHEXOL 350 MG/ML SOLN
125.0000 mL | Freq: Once | INTRAVENOUS | Status: AC | PRN
  Administered 2019-01-19: 125 mL via INTRAVENOUS

## 2019-01-19 MED ORDER — SODIUM CHLORIDE 0.9 % IV BOLUS
1000.0000 mL | Freq: Once | INTRAVENOUS | Status: AC
  Administered 2019-01-19: 1000 mL via INTRAVENOUS

## 2019-01-19 MED ORDER — SODIUM CHLORIDE 0.9% FLUSH: 3.0000 mL | Freq: Once | INTRAVENOUS | Status: DC

## 2019-01-19 NOTE — ED Triage Notes (Signed)
Pt to triage via wheelchair.  Pt reports low blood pressure this evening at home.  Pt has chest tightness.  No sob.  Pt has dizziness. Pt alert  Speech clear.

## 2019-01-19 NOTE — ED Provider Notes (Signed)
[facility] Emergency Department Provider Note    First MD Initiated Contact with Patient 01/19/19 2305     (approximate)  I have reviewed the triage vital signs and the nursing notes.   HISTORY  Chief Complaint Hypotension and Chest Pain    HPI MARV ALFREY is a 73 y.o. male with below list of previous medical conditions presents to the emergency department secondary to multiple readings of low blood pressure today while at home ranging from the 40G to 86P systolic.  Patient admits to left-sided chest tightness that radiated to the right as well as right lower quadrant abdominal discomfort.  Patient admits to dyspnea and dizziness as well.  Patient denies any nausea vomiting diarrhea.  Patient denies any urinary symptoms.        Past Medical History:  Diagnosis Date  . Arthritis   . Depression   . Diabetes mellitus with complication ([place])   . Diabetic neuropathy ([place])   . Diastolic dysfunction    a. TTE 07/2017: EF 60-65%, mild concentric LVH, no RWMA, Gr1DD, trivial AI, mildly dilated LA, RVSF normal, PASP normal  . Edema    feet/legs  . GERD (gastroesophageal reflux disease)   . Gout   . History of stress test    a. MV 06/2017: small in size, mild in severity, apical anterior and apical defect that was minimally reversible and most likely represented artifact and less likely ischemia/scar, LVEF 55-65%, low risk, probably normal stress test  . Hypertension   . Hypothyroidism   . Kidney cysts    renal failure 2013  . Kidney stones    Dr. Rogers Blocker  . OSA (obstructive sleep apnea)    supplemental oxygen at night  . Oxygen dependent    hs  . [place] spotted fever 12/10/2017   Positve IgG in titer on 11/20/17  . S/P cardiac cath 1998   a. no obstructive disease  . Syncope and collapse   . Temporary low platelet count [facility])     Patient Active Problem List   Diagnosis Date Noted  . Right kidney mass 01/20/2019  . Erectile  dysfunction due to arterial insufficiency 11/09/2018  . Respiratory illness 11/05/2018  . Tick bite of knee, initial encounter 08/17/2018  . Left shoulder pain 07/02/2018  . Allergic rhinitis 07/02/2018  . Cervical radiculopathy 11/20/2017  . Toe pain, bilateral 11/20/2017  . Abdominal pain 02/18/2017  . Clavicle enlargement 02/18/2017  . Anxiety 02/18/2017  . Degenerative arthritis of knee, bilateral 09/04/2016  . BPH (benign prostatic hyperplasia) 08/18/2016  . Knee osteoarthritis 08/18/2016  . Hyperlipidemia 05/20/2016  . Essential hypertension 05/31/2015  . Low testosterone 02/27/2015  . Diabetic neuropathy ([place]) 01/05/2013  . Obesity (BMI 30-39.9) 01/05/2013  . Chronic back pain greater than 3 months duration 01/05/2013  . Gout 01/05/2013  . OSA (obstructive sleep apnea) 01/05/2013  . Chronic kidney disease 01/04/2013  . DM type 2 with diabetic peripheral neuropathy ([place]) 01/04/2013  . Hypothyroidism 01/04/2013    Past Surgical History:  Procedure Laterality Date  . ANAL FISSURE REPAIR    . BACK SURGERY  1977   rupture disc lumbar spine  . [facility] with [facility] and Vascular.   Marland Kitchen CATARACT EXTRACTION W/PHACO Right 07/16/2016   Procedure: CATARACT EXTRACTION PHACO AND INTRAOCULAR LENS PLACEMENT (IOC);  Surgeon: [name], MD;  Location: [facility] ORS;  Service: Ophthalmology;  Laterality: Right;  [place] 01:11 AP% 17.6 CDE 24.83 fluid pack lot #  [id number]  . CATARACT EXTRACTION W/PHACO Left 08/13/2016   Procedure: CATARACT EXTRACTION PHACO AND INTRAOCULAR LENS PLACEMENT (IOC) suture placed in left eye at end of procedure;  Surgeon: [name], MD;  Location: [facility] ORS;  Service: Ophthalmology;  Laterality: Left;  [place] 01:55 AP% 22.7 CDE 53.56 fluid pack lot # [id number]  . FINGER AMPUTATION     partial  . KNEE SURGERY  1993   arthroscopy  . SHOULDER SURGERY Bilateral 1998   arthroscopic right, rotator cuff repair left     Prior to Admission medications   Medication Sig Start Date End Date Taking? Authorizing Provider  allopurinol (ZYLOPRIM) 300 MG tablet TAKE 1 TABLET(300 MG) BY MOUTH DAILY Patient taking differently: Take 300 mg by mouth daily.  11/18/18  Yes [name], MD  aspirin 81 MG tablet Take 1 tablet (81 mg total) by mouth daily. 11/19/17  Yes Wellington Hampshire, MD  calcitRIOL (ROCALTROL) 0.25 MCG capsule Take 0.25 mcg by mouth daily.  06/22/17  Yes [provider]  DULoxetine (CYMBALTA) 60 MG capsule TAKE 1 CAPSULE(60 MG) BY MOUTH DAILY Patient taking differently: Take 60 mg by mouth daily.  10/19/18  Yes [name], MD  esomeprazole (NEXIUM) 40 MG capsule Take 1 capsule (40 mg total) by mouth daily at 12 noon. Patient taking differently: Take 40 mg by mouth daily.  08/04/13  Yes Jackolyn Confer, MD  finasteride (PROSCAR) 5 MG tablet Take 1 tablet (5 mg total) by mouth daily. Patient taking differently: Take 5 mg by mouth at bedtime.  11/10/18  Yes Stoioff, Ronda Fairly, MD  fluticasone (FLONASE) 50 MCG/ACT nasal spray Place 2 sprays into both nostrils daily. Patient taking differently: Place 2 sprays into both nostrils daily as needed for allergies.  08/16/18  Yes [name], MD  furosemide (LASIX) 40 MG tablet TAKE 1 TABLET(40 MG) BY MOUTH DAILY Patient taking differently: Take 40 mg by mouth daily.  11/12/18  Yes [name], MD  HYDROcodone-acetaminophen (NORCO/VICODIN) 5-325 MG tablet Take 1 tablet by mouth every 6 (six) hours as needed for moderate pain. 12/14/18  Yes [name], MD  Insulin Glargine (LANTUS SOLOSTAR) 100 UNIT/ML Solostar Pen Inject 48 units into the skin subcutaneously once daily. 06/04/18  Yes [name], MD  levothyroxine (SYNTHROID) 100 MCG tablet TAKE 1 TABLET(100 MCG) BY MOUTH DAILY BEFORE BREAKFAST Patient taking differently: Take 100 mcg by mouth daily before breakfast.  12/15/18  Yes [name], MD  loratadine (CLARITIN) 10 MG  tablet Take 10 mg by mouth daily as needed for allergies.    Yes [provider]  losartan (COZAAR) 25 MG tablet TAKE 1 TABLET BY MOUTH TWICE DAILY Patient taking differently: Take 25 mg by mouth 2 (two) times daily.  11/23/18  Yes [name], MD  Magnesium 250 MG TABS Take 1 tablet by mouth daily.    Yes [provider]  metFORMIN (GLUCOPHAGE-XR) 500 MG 24 hr tablet TAKE 1 TABLET(500 MG) BY MOUTH TWICE DAILY Patient taking differently: Take 500 mg by mouth 2 (two) times daily. Give w/food. 04/05/18  Yes [name], MD  pregabalin (LYRICA) 100 MG capsule TAKE 1 CAPSULE BY MOUTH THREE TIMES DAILY Patient taking differently: Take 100-200 mg by mouth 2 (two) times daily. 200 mg in the morning and 100 mg at night 01/17/19  Yes [name], MD  Blood Glucose Monitoring Suppl (FREESTYLE FREEDOM LITE) W/DEVICE KIT Use as directed 09/13/13   Jackolyn Confer,  MD  doxazosin (CARDURA) 4 MG tablet Take 1 tablet (4 mg total) by mouth daily. 01/20/19   Gladstone Lighter, MD  glucose blood (FREESTYLE LITE) test strip CHECK BLOOD SUGAR TWICE DAILY 09/16/18   [name], MD  Insulin Pen Needle (PEN NEEDLES) 32G X 5 MM MISC 1 pen by Does not apply route daily. 11/05/18   [name], MD  simvastatin (ZOCOR) 40 MG tablet TAKE 1 TABLET(40 MG) BY MOUTH AT BEDTIME 01/20/19   [name], MD    Allergies Bee venom, Dye fdc red [red dye], Atenolol, Benadryl [diphenhydramine hcl (sleep)], Enalapril, Gabapentin, and Hctz [hydrochlorothiazide]  Family History  Problem Relation Age of Onset  . Breast cancer Mother   . Lung cancer Mother   . Bone cancer Mother   . Heart Problems Mother   . Cancer Mother        breast, lung and rib  . AAA (abdominal aortic aneurysm) Mother   . Heart disease Mother   . Heart attack Father   . Heart disease Father   . Cancer Brother        esophageal  . Heart attack Brother   . Colon cancer Neg Hx     Social History  Social History   Tobacco Use  . Smoking status: Never Smoker  . Smokeless tobacco: Never Used  Substance Use Topics  . Alcohol use: No  . Drug use: No    Review of Systems Constitutional: No fever/chills Eyes: No visual changes. ENT: No sore throat. Cardiovascular: Positive for chest pain. Respiratory: Denies shortness of breath. Gastrointestinal: Positive for abdominal pain.  No nausea, no vomiting.  No diarrhea.  No constipation. Genitourinary: Negative for dysuria. Musculoskeletal: Negative for neck pain.  Negative for back pain. Integumentary: Negative for rash. Neurological: Negative for headaches, focal weakness or numbness.  Positive for dizziness ____________________________________________   PHYSICAL EXAM:  VITAL SIGNS: ED Triage Vitals  Enc Vitals Group     BP 01/19/19 1918 (!) 92/57     Pulse Rate 01/19/19 1918 94     Resp 01/19/19 1918 18     Temp 01/19/19 1918 98.9 F (37.2 C)     Temp Source 01/19/19 1918 Oral     SpO2 01/19/19 1918 97 %     Weight 01/19/19 1920 120.2 kg (265 lb)     Height 01/19/19 1920 1.829 m (6')     Head Circumference --      Peak Flow --      Pain Score 01/19/19 1926 0     Pain Loc --      Pain Edu? --      Excl. in [place]? --     Constitutional: Alert and oriented.  Eyes: Conjunctivae are normal.  Head: Atraumatic. Mouth/Throat: Mucous membranes are moist. Neck: No stridor.  No meningeal signs.   Cardiovascular: Normal rate, regular rhythm. Good peripheral circulation. Grossly normal heart sounds. Respiratory: Normal respiratory effort.  No retractions. Gastrointestinal: Soft and nontender. No distention.  Musculoskeletal: No lower extremity tenderness nor edema. No gross deformities of extremities. Neurologic:  Normal speech and language. No gross focal neurologic deficits are appreciated.  Skin:  Skin is warm, dry and intact. Psychiatric: Mood and affect are normal. Speech and behavior are normal.   ____________________________________________   LABS (all labs ordered are listed, but only abnormal results are displayed)  Labs Reviewed  BASIC METABOLIC PANEL - Abnormal; Notable for the following components:      Result Value  Potassium 3.2 (*)    Glucose, Bld 140 (*)    Creatinine, Ser 1.49 (*)    GFR calc non Af Amer 46 (*)    GFR calc Af Amer 53 (*)    All other components within normal limits  CBC - Abnormal; Notable for the following components:   WBC 10.6 (*)    RBC 4.10 (*)    Hemoglobin 11.8 (*)    HCT 36.6 (*)    All other components within normal limits  URINALYSIS, COMPLETE (UACMP) WITH MICROSCOPIC - Abnormal; Notable for the following components:   Color, Urine STRAW (*)    APPearance CLEAR (*)    All other components within normal limits  SARS CORONAVIRUS 2 (HOSPITAL ORDER, [place] [facility])  SARS CORONAVIRUS 2 (TAT 6-24 HRS)  GLUCOSE, CAPILLARY  TROPONIN I (HIGH SENSITIVITY)  TROPONIN I (HIGH SENSITIVITY)   ____________________________________________  EKG  ED ECG REPORT I, [place] N , the attending physician, personally viewed and interpreted this ECG.   Date: 01/19/2019  EKG Time: 7:23 PM  Rate: 92  Rhythm: Normal sinus rhythm   Axis: Normal  Intervals: Normal  ST&T Change: None ________________________________________  RADIOLOGY I, [place] N , personally viewed and evaluated these images (plain radiographs) as part of my medical decision making, as well as reviewing the written report by the radiologist.  ED MD interpretation: No active cardiopulmonary disease on chest x-ray per radiologist  Official radiology report(s): Ct Angio Chest/abd/pel For Dissection W And/or W/wo  Result Date: 01/20/2019 CLINICAL DATA:  Chest and abdominal pain and dizziness EXAM: CT ANGIOGRAPHY CHEST, ABDOMEN AND PELVIS TECHNIQUE: Multidetector CT imaging through the chest, abdomen and pelvis was performed using the standard  protocol during bolus administration of intravenous contrast. Multiplanar reconstructed images and MIPs were obtained and reviewed to evaluate the vascular anatomy. CONTRAST:  139m OMNIPAQUE IOHEXOL 350 MG/ML SOLN COMPARISON:  None. FINDINGS: CTA CHEST FINDINGS Cardiovascular: --Heart: The heart size is normal. There is nopericardial effusion. Coronary artery calcifications are seen. --Aorta: The course and caliber of the thoracic aorta are normal. There is no aortic atherosclerotic calcification. Precontrast images show no aortic intramural hematoma. There is no blood pool, dissection or penetrating ulcer demonstrated on arterial phase postcontrast imaging. There is origin of the left vertebral artery off the brachiocephalic. The proximal arch vessels are widely patent. --Pulmonary Arteries: Contrast timing is optimized for preferential opacification of the aorta. Within that limitation, normal central pulmonary arteries. Mediastinum/Nodes: No mediastinal, hilar or axillary lymphadenopathy. The visualized thyroid and thoracic esophageal course are unremarkable. Lungs/Pleura: No pulmonary nodules or masses. No pleural effusion or pneumothorax. No focal airspace consolidation. No focal pleural abnormality. Musculoskeletal: No chest wall abnormality. No acute osseous findings. Review of the MIP images confirms the above findings. CTA ABDOMEN AND PELVIS FINDINGS VASCULAR Aorta: Normal caliber aorta without aneurysm, dissection, vasculitis or hemodynamically significant stenosis. There is scattered aortic atherosclerosis. Celiac: No aneurysm, dissection or hemodynamically significant stenosis. Normal branching pattern SMA: Widely patent without dissection or stenosis. Renals: Single renal arteries bilaterally. No aneurysm, dissection, stenosis or evidence of fibromuscular dysplasia. IMA: Patent without abnormality. Inflow: Scattered atherosclerosis seen. No aneurysm, stenosis or dissection. Veins: Normal course and  caliber of the major veins. Assessment is otherwise limited by the arterial dominant contrast phase. Review of the MIP images confirms the above findings. NON-VASCULAR Hepatobiliary: There is a 1 cm low-density lesion in the posterior right liver lobe which was seen on a prior ultrasound, likely hepatic cyst. No visible  biliary dilatation. Normal gallbladder. Pancreas: Normal contours without ductal dilatation. No peripancreatic fluid collection. Spleen: Normal arterial phase splenic enhancement pattern. Adrenals/Urinary Tract: --Adrenal glands: Normal. --Right kidney/ureter: There is interval growth in the slightly hyperdense lobular mass seen within the right kidney partially exophytic off the lower pole measuring 5.3 cm. This could represent a proteinaceous or hemorrhagic cyst. No hydronephrosis. --Left kidney/ureter: There is a new enhancing soft tissue mass seen within the left upper pole measuring 2.8 x 2.7 by 2.4 cm. No hydronephrosis or renal calculi are seen. --Urinary bladder: Unremarkable. Stomach/Bowel: --Stomach/Duodenum: No hiatal hernia or other gastric abnormality. Normal duodenal course and caliber. --Small bowel: No dilatation or inflammation. --Colon: No focal abnormality. --Appendix: Normal. Lymphatic:  No abdominal or pelvic lymphadenopathy. Reproductive: No free fluid in the pelvis. Musculoskeletal. No bony spinal canal stenosis or focal osseous abnormality. There is an S-shaped scoliotic curvature with ankylosis across the L1-L2 vertebral bodies. Other: None. Review of the MIP images confirms the above findings. IMPRESSION: 1. No acute intrathoracic or abdominal aortic abnormality. 2. Coronary artery calcifications. 3. New enhancing mass within the upper pole of the right kidney measuring 2.8 x 2.7 x 2.4 cm. This could represent a primary renal neoplasm. Would recommend dedicated MRI with contrast for further evaluation. Electronically Signed   By: Prudencio Pair M.D.   On: 01/20/2019 00:46      Procedures   ____________________________________________   INITIAL IMPRESSION / MDM / ASSESSMENT AND PLAN / ED COURSE  As part of my medical decision making, I reviewed the following data within the [place] NUMBER   73 year old male presenting with above-stated history and physical exam concerning for possible cardiac etiology versus pulmonary emboli.  Laboratory data including high-sensitivity troponin negative x2.  CT angiogram of the chest revealed no evidence of pulmonary emboli aortic aneurysm or any other identifiable pathology.  CT angiogram of the abdomen revealed a right renal mass  ____________________________________________  FINAL CLINICAL IMPRESSION(S) / ED DIAGNOSES  Final diagnoses:  Right kidney mass  Chest pain, unspecified type     MEDICATIONS GIVEN DURING THIS VISIT:  Medications  sodium chloride 0.9 % bolus 1,000 mL (0 mLs Intravenous Stopped 01/20/19 0224)  iohexol (OMNIPAQUE) 350 MG/ML injection 125 mL (125 mLs Intravenous Contrast Given 01/19/19 2352)     ED Discharge Orders         Ordered    doxazosin (CARDURA) 4 MG tablet  Daily     01/20/19 0941    Diet - low sodium heart healthy     01/20/19 0941    Activity as tolerated - No restrictions     01/20/19 0941          *Please note:  [name] Magno was evaluated in Emergency Department on 01/20/2019 for the symptoms described in the history of present illness. He was evaluated in the context of the global COVID-19 pandemic, which necessitated consideration that the patient might be at risk for infection with the SARS-CoV-2 virus that causes COVID-19. Institutional protocols and algorithms that pertain to the evaluation of patients at risk for COVID-19 are in a state of rapid change based on information released by regulatory bodies including the CDC and federal and state organizations. These policies and algorithms were followed during the patient's care in the ED.  Some ED  evaluations and interventions may be delayed as a result of limited staffing during the pandemic.*  Note:  This document was prepared using Dragon voice recognition software and may include unintentional dictation  errors.   Gregor Hams, MD 01/20/19 2322

## 2019-01-19 NOTE — ED Notes (Signed)
Patient transported to CT 

## 2019-01-20 ENCOUNTER — Telehealth: Payer: Self-pay | Admitting: Urology

## 2019-01-20 DIAGNOSIS — I959 Hypotension, unspecified: Secondary | ICD-10-CM | POA: Diagnosis not present

## 2019-01-20 DIAGNOSIS — R079 Chest pain, unspecified: Secondary | ICD-10-CM | POA: Diagnosis not present

## 2019-01-20 DIAGNOSIS — E119 Type 2 diabetes mellitus without complications: Secondary | ICD-10-CM | POA: Diagnosis not present

## 2019-01-20 DIAGNOSIS — N179 Acute kidney failure, unspecified: Secondary | ICD-10-CM | POA: Diagnosis not present

## 2019-01-20 DIAGNOSIS — N2889 Other specified disorders of kidney and ureter: Secondary | ICD-10-CM | POA: Diagnosis present

## 2019-01-20 LAB — URINALYSIS, COMPLETE (UACMP) WITH MICROSCOPIC
Bacteria, UA: NONE SEEN
Bilirubin Urine: NEGATIVE
Glucose, UA: NEGATIVE mg/dL
Hgb urine dipstick: NEGATIVE
Ketones, ur: NEGATIVE mg/dL
Leukocytes,Ua: NEGATIVE
Nitrite: NEGATIVE
Protein, ur: NEGATIVE mg/dL
Specific Gravity, Urine: 1.015 (ref 1.005–1.030)
Squamous Epithelial / HPF: NONE SEEN (ref 0–5)
pH: 5 (ref 5.0–8.0)

## 2019-01-20 LAB — TROPONIN I (HIGH SENSITIVITY): Troponin I (High Sensitivity): 14 ng/L (ref <18)

## 2019-01-20 LAB — GLUCOSE, CAPILLARY: Glucose-Capillary: 88 mg/dL (ref 70–99)

## 2019-01-20 LAB — SARS CORONAVIRUS 2 BY RT PCR (HOSPITAL ORDER, PERFORMED IN ~~LOC~~ HOSPITAL LAB): SARS Coronavirus 2: NEGATIVE

## 2019-01-20 MED ORDER — PREGABALIN 75 MG PO CAPS: 200.0000 mg | ORAL_CAPSULE | Freq: Every morning | ORAL | Status: DC

## 2019-01-20 MED ORDER — DULOXETINE HCL 60 MG PO CPEP: 60.0000 mg | ORAL_CAPSULE | Freq: Every day | ORAL | Status: DC

## 2019-01-20 MED ORDER — ENOXAPARIN SODIUM 40 MG/0.4ML ~~LOC~~ SOLN: 40.0000 mg | SUBCUTANEOUS | Status: DC

## 2019-01-20 MED ORDER — SODIUM CHLORIDE 0.9 % IV SOLN
INTRAVENOUS | Status: DC
  Administered 2019-01-20: 07:00:00 via INTRAVENOUS

## 2019-01-20 MED ORDER — FLUTICASONE PROPIONATE 50 MCG/ACT NA SUSP: 2.0000 | Freq: Every day | NASAL | Status: DC | PRN

## 2019-01-20 MED ORDER — CALCITRIOL 0.25 MCG PO CAPS: 0.2500 ug | ORAL_CAPSULE | Freq: Every day | ORAL | Status: DC

## 2019-01-20 MED ORDER — DOXAZOSIN MESYLATE 4 MG PO TABS: 4.0000 mg | ORAL_TABLET | Freq: Every day | ORAL | Status: DC

## 2019-01-20 MED ORDER — HEPARIN SODIUM (PORCINE) 5000 UNIT/ML IJ SOLN
5000.0000 [IU] | Freq: Three times a day (TID) | INTRAMUSCULAR | Status: DC
  Administered 2019-01-20: 5000 [IU] via SUBCUTANEOUS
  Filled 2019-01-20: qty 1

## 2019-01-20 MED ORDER — SIMVASTATIN 10 MG PO TABS: 40.0000 mg | ORAL_TABLET | Freq: Every day | ORAL | Status: DC

## 2019-01-20 MED ORDER — PREGABALIN 50 MG PO CAPS: 100.0000 mg | ORAL_CAPSULE | Freq: Two times a day (BID) | ORAL | Status: DC

## 2019-01-20 MED ORDER — ASPIRIN EC 81 MG PO TBEC: 81.0000 mg | DELAYED_RELEASE_TABLET | Freq: Every day | ORAL | Status: DC

## 2019-01-20 MED ORDER — FINASTERIDE 5 MG PO TABS: 5.0000 mg | ORAL_TABLET | Freq: Every day | ORAL | Status: DC

## 2019-01-20 MED ORDER — HYDROCODONE-ACETAMINOPHEN 5-325 MG PO TABS: 1.0000 | ORAL_TABLET | Freq: Four times a day (QID) | ORAL | Status: DC | PRN

## 2019-01-20 MED ORDER — INSULIN ASPART 100 UNIT/ML ~~LOC~~ SOLN: 0.0000 [IU] | Freq: Every day | SUBCUTANEOUS | Status: DC

## 2019-01-20 MED ORDER — INSULIN ASPART 100 UNIT/ML ~~LOC~~ SOLN: 0.0000 [IU] | Freq: Three times a day (TID) | SUBCUTANEOUS | Status: DC

## 2019-01-20 MED ORDER — ALLOPURINOL 300 MG PO TABS: 300.0000 mg | ORAL_TABLET | Freq: Every day | ORAL | Status: DC

## 2019-01-20 MED ORDER — INSULIN GLARGINE 100 UNIT/ML ~~LOC~~ SOLN: 48.0000 [IU] | Freq: Every day | SUBCUTANEOUS | Status: DC

## 2019-01-20 MED ORDER — PREGABALIN 50 MG PO CAPS: 100.0000 mg | ORAL_CAPSULE | Freq: Every day | ORAL | Status: DC

## 2019-01-20 MED ORDER — POTASSIUM CHLORIDE CRYS ER 20 MEQ PO TBCR
40.0000 meq | EXTENDED_RELEASE_TABLET | Freq: Two times a day (BID) | ORAL | Status: DC
  Administered 2019-01-20: 40 meq via ORAL
  Filled 2019-01-20: qty 2

## 2019-01-20 MED ORDER — ENOXAPARIN SODIUM 40 MG/0.4ML ~~LOC~~ SOLN: 30.0000 mg | SUBCUTANEOUS | Status: DC

## 2019-01-20 MED ORDER — LORATADINE 10 MG PO TABS: 10.0000 mg | ORAL_TABLET | Freq: Every day | ORAL | Status: DC | PRN

## 2019-01-20 MED ORDER — PANTOPRAZOLE SODIUM 40 MG PO TBEC: 40.0000 mg | DELAYED_RELEASE_TABLET | Freq: Every day | ORAL | Status: DC

## 2019-01-20 MED ORDER — MAGNESIUM OXIDE 400 (241.3 MG) MG PO TABS: 400.0000 mg | ORAL_TABLET | Freq: Every day | ORAL | Status: DC

## 2019-01-20 MED ORDER — LEVOTHYROXINE SODIUM 50 MCG PO TABS
100.0000 ug | ORAL_TABLET | Freq: Every day | ORAL | Status: DC
  Administered 2019-01-20: 100 ug via ORAL
  Filled 2019-01-20: qty 2

## 2019-01-20 NOTE — ED Notes (Signed)
Per Dr Tressia Miners- if orthostatic vitals are okay then pt may be discharged

## 2019-01-20 NOTE — ED Notes (Signed)
ED TO INPATIENT HANDOFF REPORT  ED Nurse Name and Phone #: Karena Addison [phone]  S Name/Age/Gender Douglas [name] 73 y.o. male Room/Bed: ED12A/ED12A  Code Status   Code Status: Full Code  Home/SNF/Other Home Patient oriented to: self, place, time and situation Is this baseline? Yes   Triage Complete: Triage complete  Chief Complaint Dizziness  Triage Note Pt to triage via wheelchair.  Pt reports low blood pressure this evening at home.  Pt has chest tightness.  No sob.  Pt has dizziness. Pt alert  Speech clear.    Allergies Allergies  Allergen Reactions  . Bee Venom Swelling  . Dye Fdc Red [Red Dye] Swelling and Other (See Comments)    Reaction: gout  . Atenolol Other (See Comments)    Did not regulate blood pressure  . Benadryl [Diphenhydramine Hcl (Sleep)] Other (See Comments)    Hyperactivity   . Enalapril Itching    itching  . Gabapentin Palpitations and Rash    rash  . Hctz [Hydrochlorothiazide] Other (See Comments)    Decreased potassium    Level of Care/Admitting Diagnosis ED Disposition    ED Disposition Condition [facility] Area: [place] [100120]  Level of Care: Med-Surg [16]  Covid Evaluation: Asymptomatic Screening Protocol (No Symptoms)  Diagnosis: Right kidney mass [354100]  Admitting Physician: Lang Snow [[id number]]  Attending Physician: Rufina Falco ACHIENG [[id number]]  Estimated length of stay: past midnight tomorrow  Certification:: I certify this patient will need inpatient services for at least 2 midnights  PT Class (Do Not Modify): Inpatient [101]  PT Acc Code (Do Not Modify): Private [1]       B Medical/Surgery History Past Medical History:  Diagnosis Date  . Arthritis   . Depression   . Diabetes mellitus with complication ([place])   . Diabetic neuropathy ([place])   . Diastolic dysfunction    a. TTE 07/2017: EF 60-65%, mild concentric LVH, no RWMA, Gr1DD, trivial AI, mildly dilated LA,  RVSF normal, PASP normal  . Edema    feet/legs  . GERD (gastroesophageal reflux disease)   . Gout   . History of stress test    a. MV 06/2017: small in size, mild in severity, apical anterior and apical defect that was minimally reversible and most likely represented artifact and less likely ischemia/scar, LVEF 55-65%, low risk, probably normal stress test  . Hypertension   . Hypothyroidism   . Kidney cysts    renal failure 2013  . Kidney stones    Dr. Rogers Blocker  . OSA (obstructive sleep apnea)    supplemental oxygen at night  . Oxygen dependent    hs  . [place] spotted fever 12/10/2017   Positve IgG in titer on 11/20/17  . S/P cardiac cath 1998   a. no obstructive disease  . Syncope and collapse   . Temporary low platelet count (HCC)    Past Surgical History:  Procedure Laterality Date  . ANAL FISSURE REPAIR    . BACK SURGERY  1977   rupture disc lumbar spine  . [facility] with [facility].   Marland Kitchen CATARACT EXTRACTION W/PHACO Right 07/16/2016   Procedure: CATARACT EXTRACTION PHACO AND INTRAOCULAR LENS PLACEMENT (IOC);  Surgeon: [name], MD;  Location: [facility] ORS;  Service: Ophthalmology;  Laterality: Right;  [place] 01:11 AP% 17.6 CDE 24.83 fluid pack lot # [id number]  . CATARACT EXTRACTION W/PHACO Left 08/13/2016   Procedure: CATARACT EXTRACTION  PHACO AND INTRAOCULAR LENS PLACEMENT (IOC) suture placed in left eye at end of procedure;  Surgeon: [name], MD;  Location: [facility] ORS;  Service: Ophthalmology;  Laterality: Left;  [place] 01:55 AP% 22.7 CDE 53.56 fluid pack lot # [id number]  . FINGER AMPUTATION     partial  . KNEE SURGERY  1993   arthroscopy  . SHOULDER SURGERY Bilateral 1998   arthroscopic right, rotator cuff repair left     A IV Location/Drains/Wounds Patient Lines/Drains/Airways Status   Active Line/Drains/Airways    Name:   Placement date:   Placement time:   Site:   Days:   Peripheral IV 01/19/19  Left Antecubital   01/19/19    2333    Antecubital   1   Incision (Closed) 07/16/16 Eye Right   07/16/16    1043     918   Incision (Closed) 08/13/16 Eye Left   08/13/16    0736     890          Intake/Output Last 24 hours No intake or output data in the 24 hours ending 01/20/19 0225  Labs/Imaging Results for orders placed or performed during the hospital encounter of 01/19/19 (from the past 48 hour(s))  Basic metabolic panel     Status: Abnormal   Collection Time: 01/19/19  7:20 PM  Result Value Ref Range   Sodium 136 135 - 145 mmol/L   Potassium 3.2 (L) 3.5 - 5.1 mmol/L   Chloride 98 98 - 111 mmol/L   CO2 25 22 - 32 mmol/L   Glucose, Bld 140 (H) 70 - 99 mg/dL   BUN 15 8 - 23 mg/dL   Creatinine, Ser 1.49 (H) 0.61 - 1.24 mg/dL   Calcium 9.1 8.9 - 10.3 mg/dL   GFR calc non Af Amer 46 (L) >60 mL/min   GFR calc Af Amer 53 (L) >60 mL/min   Anion gap 13 5 - 15    Comment: Performed at [facility], [address] [place], [place] [address]  CBC     Status: Abnormal   Collection Time: 01/19/19  7:20 PM  Result Value Ref Range   WBC 10.6 (H) 4.0 - 10.5 K/uL   RBC 4.10 (L) 4.22 - 5.81 MIL/uL   Hemoglobin 11.8 (L) 13.0 - 17.0 g/dL   HCT 36.6 (L) 39.0 - 52.0 %   MCV 89.3 80.0 - 100.0 fL   MCH 28.8 26.0 - 34.0 pg   MCHC 32.2 30.0 - 36.0 g/dL   RDW 14.5 11.5 - 15.5 %   Platelets 258 150 - 400 K/uL   nRBC 0.0 0.0 - 0.2 %    Comment: Performed at [facility], [place], [place] [address]  Troponin I (High Sensitivity)     Status: None   Collection Time: 01/19/19  7:20 PM  Result Value Ref Range   Troponin I (High Sensitivity) 16 <18 ng/L    Comment: (NOTE) Elevated high sensitivity troponin I (hsTnI) values and significant  changes across serial measurements may suggest ACS but many other  chronic and acute conditions are known to elevate hsTnI results.  Refer to the "Links" section for chest pain algorithms and additional  guidance. Performed  at [facility], [address] [place], [place] [address]   Troponin I (High Sensitivity)     Status: None   Collection Time: 01/19/19 11:37 PM  Result Value Ref Range   Troponin I (High Sensitivity) 14 <18 ng/L  Comment: (NOTE) Elevated high sensitivity troponin I (hsTnI) values and significant  changes across serial measurements may suggest ACS but many other  chronic and acute conditions are known to elevate hsTnI results.  Refer to the "Links" section for chest pain algorithms and additional  guidance. Performed at [facility], [address] [place], [place] [address]   Urinalysis, Complete w Microscopic     Status: Abnormal   Collection Time: 01/20/19  1:50 AM  Result Value Ref Range   Color, Urine STRAW (A) YELLOW   APPearance CLEAR (A) CLEAR   Specific Gravity, Urine 1.015 1.005 - 1.030   pH 5.0 5.0 - 8.0   Glucose, UA NEGATIVE NEGATIVE mg/dL   Hgb urine dipstick NEGATIVE NEGATIVE   Bilirubin Urine NEGATIVE NEGATIVE   Ketones, ur NEGATIVE NEGATIVE mg/dL   Protein, ur NEGATIVE NEGATIVE mg/dL   Nitrite NEGATIVE NEGATIVE   Leukocytes,Ua NEGATIVE NEGATIVE   RBC / HPF 0-5 0 - 5 RBC/hpf   WBC, UA 0-5 0 - 5 WBC/hpf   Bacteria, UA NONE SEEN NONE SEEN   Squamous Epithelial / LPF NONE SEEN 0 - 5   Mucus PRESENT     Comment: Performed at [facility], [address] [place], [place] [address]   Dg Chest 2 View  Result Date: 01/19/2019 CLINICAL DATA:  Chest pain EXAM: CHEST - 2 VIEW COMPARISON:  07/11/2008 FINDINGS: The heart size and mediastinal contours are within normal limits. Both lungs are clear. The visualized skeletal structures are unremarkable. IMPRESSION: No active cardiopulmonary disease. Electronically Signed   By: Donavan Foil M.D.   On: 01/19/2019 19:59   Ct Angio Chest/abd/pel For Dissection W And/or W/wo  Result Date: 01/20/2019 CLINICAL DATA:  Chest and abdominal pain and dizziness EXAM: CT ANGIOGRAPHY CHEST, ABDOMEN AND  PELVIS TECHNIQUE: Multidetector CT imaging through the chest, abdomen and pelvis was performed using the standard protocol during bolus administration of intravenous contrast. Multiplanar reconstructed images and MIPs were obtained and reviewed to evaluate the vascular anatomy. CONTRAST:  192mL OMNIPAQUE IOHEXOL 350 MG/ML SOLN COMPARISON:  None. FINDINGS: CTA CHEST FINDINGS Cardiovascular: --Heart: The heart size is normal. There is nopericardial effusion. Coronary artery calcifications are seen. --Aorta: The course and caliber of the thoracic aorta are normal. There is no aortic atherosclerotic calcification. Precontrast images show no aortic intramural hematoma. There is no blood pool, dissection or penetrating ulcer demonstrated on arterial phase postcontrast imaging. There is origin of the left vertebral artery off the brachiocephalic. The proximal arch vessels are widely patent. --Pulmonary Arteries: Contrast timing is optimized for preferential opacification of the aorta. Within that limitation, normal central pulmonary arteries. Mediastinum/Nodes: No mediastinal, hilar or axillary lymphadenopathy. The visualized thyroid and thoracic esophageal course are unremarkable. Lungs/Pleura: No pulmonary nodules or masses. No pleural effusion or pneumothorax. No focal airspace consolidation. No focal pleural abnormality. Musculoskeletal: No chest wall abnormality. No acute osseous findings. Review of the MIP images confirms the above findings. CTA ABDOMEN AND PELVIS FINDINGS VASCULAR Aorta: Normal caliber aorta without aneurysm, dissection, vasculitis or hemodynamically significant stenosis. There is scattered aortic atherosclerosis. Celiac: No aneurysm, dissection or hemodynamically significant stenosis. Normal branching pattern SMA: Widely patent without dissection or stenosis. Renals: Single renal arteries bilaterally. No aneurysm, dissection, stenosis or evidence of fibromuscular dysplasia. IMA: Patent without  abnormality. Inflow: Scattered atherosclerosis seen. No aneurysm, stenosis or dissection. Veins: Normal course and caliber of the major veins. Assessment is otherwise limited by the arterial dominant contrast phase. Review of the MIP images confirms  the above findings. NON-VASCULAR Hepatobiliary: There is a 1 cm low-density lesion in the posterior right liver lobe which was seen on a prior ultrasound, likely hepatic cyst. No visible biliary dilatation. Normal gallbladder. Pancreas: Normal contours without ductal dilatation. No peripancreatic fluid collection. Spleen: Normal arterial phase splenic enhancement pattern. Adrenals/Urinary Tract: --Adrenal glands: Normal. --Right kidney/ureter: There is interval growth in the slightly hyperdense lobular mass seen within the right kidney partially exophytic off the lower pole measuring 5.3 cm. This could represent a proteinaceous or hemorrhagic cyst. No hydronephrosis. --Left kidney/ureter: There is a new enhancing soft tissue mass seen within the left upper pole measuring 2.8 x 2.7 by 2.4 cm. No hydronephrosis or renal calculi are seen. --Urinary bladder: Unremarkable. Stomach/Bowel: --Stomach/Duodenum: No hiatal hernia or other gastric abnormality. Normal duodenal course and caliber. --Small bowel: No dilatation or inflammation. --Colon: No focal abnormality. --Appendix: Normal. Lymphatic:  No abdominal or pelvic lymphadenopathy. Reproductive: No free fluid in the pelvis. Musculoskeletal. No bony spinal canal stenosis or focal osseous abnormality. There is an S-shaped scoliotic curvature with ankylosis across the L1-L2 vertebral bodies. Other: None. Review of the MIP images confirms the above findings. IMPRESSION: 1. No acute intrathoracic or abdominal aortic abnormality. 2. Coronary artery calcifications. 3. New enhancing mass within the upper pole of the right kidney measuring 2.8 x 2.7 x 2.4 cm. This could represent a primary renal neoplasm. Would recommend dedicated  MRI with contrast for further evaluation. Electronically Signed   By: Prudencio Pair M.D.   On: 01/20/2019 00:46    Pending Labs Unresulted Labs (From admission, onward)    Start     Ordered   01/20/19 0205  SARS Coronavirus 2 [facility] order, Performed in [facility] hospital lab)  Once,   R     01/20/19 0205          Vitals/Pain Today's Vitals   01/20/19 0030 01/20/19 0100 01/20/19 0130 01/20/19 0200  BP: (!) 156/89 (!) 154/96 (!) 157/85 (!) 146/83  Pulse:  73 68   Resp: 15 (!) 9 (!) 8 11  Temp:      TempSrc:      SpO2:  99% 100%   Weight:      Height:      PainSc:        Isolation Precautions No active isolations  Medications Medications  enoxaparin (LOVENOX) injection 30 mg (has no administration in time range)  0.9 %  sodium chloride infusion (has no administration in time range)  sodium chloride 0.9 % bolus 1,000 mL (0 mLs Intravenous Stopped 01/20/19 0224)  iohexol (OMNIPAQUE) 350 MG/ML injection 125 mL (125 mLs Intravenous Contrast Given 01/19/19 2352)    Mobility walks Low fall risk   Focused Assessments    R Recommendations: See Admitting Provider Note  Report given to:

## 2019-01-20 NOTE — Discharge Instructions (Signed)
None

## 2019-01-20 NOTE — Care Management Obs Status (Signed)
[place] NOTIFICATION   Patient Details  Name: Douglas [name] MRN: [id number] Date of Birth: Mar 13, 1946   Medicare Observation Status Notification Given:  Yes    Tania Orrie Lascano, LCSW 01/20/2019, 10:27 AM

## 2019-01-20 NOTE — ED Notes (Signed)
Secure message sent to Dr Michail Sermon about pt's orthostatic vitals

## 2019-01-20 NOTE — ED Notes (Signed)
Dr. Kalisetti at bedside. 

## 2019-01-20 NOTE — Telephone Encounter (Signed)
-----   Message from Billey Co, MD sent at 01/20/2019  7:53 AM EDT ----- Regarding: follow up w [facility] Known patient of Dr. Dene Gentry with new renal mass, please set him up to see [facility] in 4-6 weeks, thanks  Nickolas Madrid, MD 01/20/2019

## 2019-01-20 NOTE — H&P (Addendum)
[facility] NAME: Douglas [name]    MR#:  [id number]  DATE OF BIRTH:  Jun 13, 1945  DATE OF ADMISSION:  01/19/2019  PRIMARY CARE PHYSICIAN: [name], MD   REQUESTING/REFERRING PHYSICIAN: Marjean Donna, MD  CHIEF COMPLAINT:   Chief Complaint  Patient presents with   Hypotension   Chest Pain    HISTORY OF PRESENT ILLNESS:  73 y.o. male with pertinent past medical history of CKD stage IV, diabetes mellitus type 2, diabetic neuropathy, GERD, hypertension, hypothyroidism, OSA, thrombocytopenia, and BPH presenting to the ED with chief complaints of dizziness and hypotension.  Patient report intermittent episode of dizziness for several weeks however yesterday he felt and usually dizzy and tired worse at suppertime.  Patient stated he had to sit down to avoid passing out.  Denies associated symptoms of syncope, headache, visual disturbances, numbness or tingling sensation, focal neurological deficit, or speech changes, hematuria, fatigue or weight changes.  Patient states he took his blood pressure and noted it to be in the 86L systolic. He decided to come to the ED for further evaluation due to worsening symptoms  associated with nausea and chest tightness.  On arrival to the ED, he was afebrile with blood pressure 92/59 mm Hg and pulse rate 94 beats/min. There were no focal neurological deficits; he was alert and oriented x4, and he did not demonstrate any memory deficits.  Labs revealed glucose 140, creatinine 1.45 improved from 1.54, potassium 3.2, WBC 10.9, COVID-19 negative.  Urinalysis negative for UTI.  CT angio chest/abdomen/pelvis showed no evidence of PE however a new enhancing mass within the upper pole of the right kidney was noted concerning for primary renal neoplasm.  Given this finding hospitalist asked to admit for further management.  PAST MEDICAL HISTORY:   Past Medical History:  Diagnosis Date    Arthritis    Depression    Diabetes mellitus with complication (HCC)    Diabetic neuropathy (HCC)    Diastolic dysfunction    a. TTE 07/2017: EF 60-65%, mild concentric LVH, no RWMA, Gr1DD, trivial AI, mildly dilated LA, RVSF normal, PASP normal   Edema    feet/legs   GERD (gastroesophageal reflux disease)    Gout    History of stress test    a. MV 06/2017: small in size, mild in severity, apical anterior and apical defect that was minimally reversible and most likely represented artifact and less likely ischemia/scar, LVEF 55-65%, low risk, probably normal stress test   Hypertension    Hypothyroidism    Kidney cysts    renal failure 2013   Kidney stones    Dr. Rogers Blocker   OSA (obstructive sleep apnea)    supplemental oxygen at night   Oxygen dependent    hs   [facility] spotted fever 12/10/2017   Positve IgG in titer on 11/20/17   S/P cardiac cath 1998   a. no obstructive disease   Syncope and collapse    Temporary low platelet count ([place])     PAST SURGICAL HISTORY:   Past Surgical History:  Procedure Laterality Date   ANAL FISSURE REPAIR     BACK SURGERY  1977   rupture disc lumbar spine   [facility] [place] [facility].    CATARACT EXTRACTION W/PHACO Right 07/16/2016   Procedure: CATARACT EXTRACTION PHACO AND INTRAOCULAR LENS PLACEMENT (IOC);  Surgeon: [name], MD;  Location: [facility] ORS;  Service:  Ophthalmology;  Laterality: Right;  [place] 01:11 AP% 17.6 CDE 24.83 fluid pack lot # [id number]   CATARACT EXTRACTION W/PHACO Left 08/13/2016   Procedure: CATARACT EXTRACTION PHACO AND INTRAOCULAR LENS PLACEMENT ([place]) suture placed in left eye at end of procedure;  Surgeon: [name], MD;  Location: [facility] ORS;  Service: Ophthalmology;  Laterality: Left;  [place] 01:55 AP% 22.7 CDE 53.56 fluid pack lot # [id number]   FINGER AMPUTATION     partial   KNEE SURGERY  1993   arthroscopy   SHOULDER SURGERY  Bilateral 1998   arthroscopic right, rotator cuff repair left    SOCIAL HISTORY:   Social History   Tobacco Use   Smoking status: Never Smoker   Smokeless tobacco: Never Used  Substance Use Topics   Alcohol use: No    FAMILY HISTORY:   Family History  Problem Relation Age of Onset   Breast cancer Mother    Lung cancer Mother    Bone cancer Mother    Heart Problems Mother    Cancer Mother        breast, lung and rib   AAA (abdominal aortic aneurysm) Mother    Heart disease Mother    Heart attack Father    Heart disease Father    Cancer Brother        esophageal   Heart attack Brother    Colon cancer Neg Hx     DRUG ALLERGIES:   Allergies  Allergen Reactions   Bee Venom Swelling   Dye Fdc Red [Red Dye] Swelling and Other (See Comments)    Reaction: gout   Atenolol Other (See Comments)    Did not regulate blood pressure   Benadryl [Diphenhydramine Hcl (Sleep)] Other (See Comments)    Hyperactivity    Enalapril Itching    itching   Gabapentin Palpitations and Rash    rash   Hctz [Hydrochlorothiazide] Other (See Comments)    Decreased potassium    REVIEW OF SYSTEMS:   Review of Systems  Constitutional: Negative for chills, fever, malaise/fatigue and weight loss.  HENT: Negative for congestion, hearing loss and sore throat.   Eyes: Negative for blurred vision and double vision.  Respiratory: Negative for cough, shortness of breath and wheezing.   Cardiovascular: Negative for chest pain, palpitations, orthopnea and leg swelling.       Chest tightness  Gastrointestinal: Positive for abdominal pain and nausea. Negative for diarrhea and vomiting.  Genitourinary: Negative for dysuria and urgency.  Musculoskeletal: Positive for back pain. Negative for myalgias.  Skin: Negative for rash.  Neurological: Positive for dizziness and sensory change. Negative for speech change, focal weakness and headaches.  Psychiatric/Behavioral: Negative for  depression.    MEDICATIONS AT HOME:   Prior to Admission medications   Medication Sig Start Date End Date Taking? Authorizing Provider  allopurinol (ZYLOPRIM) 300 MG tablet TAKE 1 TABLET(300 MG) BY MOUTH DAILY Patient taking differently: Take 300 mg by mouth daily.  11/18/18  Yes [name], MD  aspirin 81 MG tablet Take 1 tablet (81 mg total) by mouth daily. 11/19/17  Yes Wellington Hampshire, MD  calcitRIOL (ROCALTROL) 0.25 MCG capsule Take 0.25 mcg by mouth daily.  06/22/17  Yes [provider]  doxazosin (CARDURA) 8 MG tablet TAKE 1 TABLET(8 MG) BY MOUTH AT BEDTIME Patient taking differently: Take 8 mg by mouth at bedtime.  01/17/19  Yes [name], MD  DULoxetine (CYMBALTA) 60 MG capsule TAKE 1 CAPSULE(60 MG) BY MOUTH DAILY Patient taking  differently: Take 60 mg by mouth daily.  10/19/18  Yes [name], MD  esomeprazole (NEXIUM) 40 MG capsule Take 1 capsule (40 mg total) by mouth daily at 12 noon. Patient taking differently: Take 40 mg by mouth daily.  08/04/13  Yes Jackolyn Confer, MD  finasteride (PROSCAR) 5 MG tablet Take 1 tablet (5 mg total) by mouth daily. Patient taking differently: Take 5 mg by mouth at bedtime.  11/10/18  Yes Stoioff, Ronda Fairly, MD  fluticasone (FLONASE) 50 MCG/ACT nasal spray Place 2 sprays into both nostrils daily. Patient taking differently: Place 2 sprays into both nostrils daily as needed for allergies.  08/16/18  Yes [name], MD  furosemide (LASIX) 40 MG tablet TAKE 1 TABLET(40 MG) BY MOUTH DAILY Patient taking differently: Take 40 mg by mouth daily.  11/12/18  Yes [name], MD  HYDROcodone-acetaminophen (NORCO/VICODIN) 5-325 MG tablet Take 1 tablet by mouth every 6 (six) hours as needed for moderate pain. 12/14/18  Yes [name], MD  Insulin Glargine (LANTUS SOLOSTAR) 100 UNIT/ML Solostar Pen Inject 48 units into the skin subcutaneously once daily. 06/04/18  Yes [name], MD  levothyroxine  (SYNTHROID) 100 MCG tablet TAKE 1 TABLET(100 MCG) BY MOUTH DAILY BEFORE BREAKFAST Patient taking differently: Take 100 mcg by mouth daily before breakfast.  12/15/18  Yes [name], MD  loratadine (CLARITIN) 10 MG tablet Take 10 mg by mouth daily as needed for allergies.    Yes [provider]  losartan (COZAAR) 25 MG tablet TAKE 1 TABLET BY MOUTH TWICE DAILY Patient taking differently: Take 25 mg by mouth 2 (two) times daily.  11/23/18  Yes [name], MD  Magnesium 250 MG TABS Take 1 tablet by mouth daily.    Yes [provider]  metFORMIN (GLUCOPHAGE-XR) 500 MG 24 hr tablet TAKE 1 TABLET(500 MG) BY MOUTH TWICE DAILY Patient taking differently: Take 500 mg by mouth 2 (two) times daily. Give w/food. 04/05/18  Yes [name], MD  pregabalin (LYRICA) 100 MG capsule TAKE 1 CAPSULE BY MOUTH THREE TIMES DAILY Patient taking differently: Take 100-200 mg by mouth 2 (two) times daily. 200 mg in the morning and 100 mg at night 01/17/19  Yes [name], MD  sildenafil (REVATIO) 20 MG tablet 2-3 tabs 1 hour prior to intercourse Patient taking differently: Take 40-60 mg by mouth as needed (erectile dysfunction). Take 1 hour prior to intercourse 11/17/18  Yes Stoioff, Ronda Fairly, MD  simvastatin (ZOCOR) 40 MG tablet Take 1 tablet (40 mg total) by mouth at bedtime. 10/20/17  Yes [name], MD  Blood Glucose Monitoring Suppl (FREESTYLE FREEDOM LITE) W/DEVICE KIT Use as directed 09/13/13   Jackolyn Confer, MD  glucose blood (FREESTYLE LITE) test strip CHECK BLOOD SUGAR TWICE DAILY 09/16/18   [name], MD  Insulin Pen Needle (PEN NEEDLES) 32G X 5 MM MISC 1 pen by Does not apply route daily. 11/05/18   [name], MD      VITAL SIGNS:  Blood pressure (!) 162/87, pulse 64, temperature 98.9 F (37.2 C), temperature source Oral, resp. rate 13, height 6' (1.829 m), weight 120.2 kg, SpO2 98 %.  PHYSICAL EXAMINATION:   Physical Exam  GENERAL:  73  y.o.-year-old patient lying in the bed with no acute distress.  EYES: Pupils equal, round, reactive to light and accommodation. No scleral icterus. Extraocular muscles intact.  HEENT: Head atraumatic, normocephalic. Oropharynx and nasopharynx clear.  NECK:  Supple, no jugular venous distention. No thyroid enlargement, no tenderness.  LUNGS: Normal breath sounds bilaterally, no wheezing, rales,rhonchi or crepitation. No use of accessory muscles of respiration.  CARDIOVASCULAR: S1, S2 normal. No murmurs, rubs, or gallops.  ABDOMEN: Soft, nontender, distended. Bowel sounds present. No organomegaly or mass.  EXTREMITIES: No pedal edema, cyanosis, or clubbing.  NEUROLOGIC: Cranial nerves II through XII are intact. Muscle strength 5/5 in all extremities. Sensation intact. Gait not checked.  PSYCHIATRIC: The patient is alert and oriented x 3.  SKIN: No obvious rash, lesion, or ulcer.   DATA REVIEWED:  LABORATORY PANEL:   CBC Recent Labs  Lab 01/19/19 1920  WBC 10.6*  HGB 11.8*  HCT 36.6*  PLT 258   ------------------------------------------------------------------------------------------------------------------  Chemistries  Recent Labs  Lab 01/19/19 1920  NA 136  K 3.2*  CL 98  CO2 25  GLUCOSE 140*  BUN 15  CREATININE 1.49*  CALCIUM 9.1   ------------------------------------------------------------------------------------------------------------------  Cardiac Enzymes No results for input(s): TROPONINI in the last 168 hours. ------------------------------------------------------------------------------------------------------------------  RADIOLOGY:  Dg Chest 2 View  Result Date: 01/19/2019 CLINICAL DATA:  Chest pain EXAM: CHEST - 2 VIEW COMPARISON:  07/11/2008 FINDINGS: The heart size and mediastinal contours are within normal limits. Both lungs are clear. The visualized skeletal structures are unremarkable. IMPRESSION: No active cardiopulmonary disease. Electronically  Signed   By: Donavan Foil M.D.   On: 01/19/2019 19:59   Ct Angio Chest/abd/pel For Dissection W And/or W/wo  Result Date: 01/20/2019 CLINICAL DATA:  Chest and abdominal pain and dizziness EXAM: CT ANGIOGRAPHY CHEST, ABDOMEN AND PELVIS TECHNIQUE: Multidetector CT imaging through the chest, abdomen and pelvis was performed using the standard protocol during bolus administration of intravenous contrast. Multiplanar reconstructed images and MIPs were obtained and reviewed to evaluate the vascular anatomy. CONTRAST:  138m OMNIPAQUE IOHEXOL 350 MG/ML SOLN COMPARISON:  None. FINDINGS: CTA CHEST FINDINGS Cardiovascular: --Heart: The heart size is normal. There is nopericardial effusion. Coronary artery calcifications are seen. --Aorta: The course and caliber of the thoracic aorta are normal. There is no aortic atherosclerotic calcification. Precontrast images show no aortic intramural hematoma. There is no blood pool, dissection or penetrating ulcer demonstrated on arterial phase postcontrast imaging. There is origin of the left vertebral artery off the brachiocephalic. The proximal arch vessels are widely patent. --Pulmonary Arteries: Contrast timing is optimized for preferential opacification of the aorta. Within that limitation, normal central pulmonary arteries. Mediastinum/Nodes: No mediastinal, hilar or axillary lymphadenopathy. The visualized thyroid and thoracic esophageal course are unremarkable. Lungs/Pleura: No pulmonary nodules or masses. No pleural effusion or pneumothorax. No focal airspace consolidation. No focal pleural abnormality. Musculoskeletal: No chest wall abnormality. No acute osseous findings. Review of the MIP images confirms the above findings. CTA ABDOMEN AND PELVIS FINDINGS VASCULAR Aorta: Normal caliber aorta without aneurysm, dissection, vasculitis or hemodynamically significant stenosis. There is scattered aortic atherosclerosis. Celiac: No aneurysm, dissection or hemodynamically  significant stenosis. Normal branching pattern SMA: Widely patent without dissection or stenosis. Renals: Single renal arteries bilaterally. No aneurysm, dissection, stenosis or evidence of fibromuscular dysplasia. IMA: Patent without abnormality. Inflow: Scattered atherosclerosis seen. No aneurysm, stenosis or dissection. Veins: Normal course and caliber of the major veins. Assessment is otherwise limited by the arterial dominant contrast phase. Review of the MIP images confirms the above findings. NON-VASCULAR Hepatobiliary: There is a 1 cm low-density lesion in the posterior right liver lobe which was seen on a prior ultrasound, likely hepatic cyst. No visible biliary dilatation. Normal gallbladder. Pancreas: Normal contours without  ductal dilatation. No peripancreatic fluid collection. Spleen: Normal arterial phase splenic enhancement pattern. Adrenals/Urinary Tract: --Adrenal glands: Normal. --Right kidney/ureter: There is interval growth in the slightly hyperdense lobular mass seen within the right kidney partially exophytic off the lower pole measuring 5.3 cm. This could represent a proteinaceous or hemorrhagic cyst. No hydronephrosis. --Left kidney/ureter: There is a new enhancing soft tissue mass seen within the left upper pole measuring 2.8 x 2.7 by 2.4 cm. No hydronephrosis or renal calculi are seen. --Urinary bladder: Unremarkable. Stomach/Bowel: --Stomach/Duodenum: No hiatal hernia or other gastric abnormality. Normal duodenal course and caliber. --Small bowel: No dilatation or inflammation. --Colon: No focal abnormality. --Appendix: Normal. Lymphatic:  No abdominal or pelvic lymphadenopathy. Reproductive: No free fluid in the pelvis. Musculoskeletal. No bony spinal canal stenosis or focal osseous abnormality. There is an S-shaped scoliotic curvature with ankylosis across the L1-L2 vertebral bodies. Other: None. Review of the MIP images confirms the above findings. IMPRESSION: 1. No acute intrathoracic  or abdominal aortic abnormality. 2. Coronary artery calcifications. 3. New enhancing mass within the upper pole of the right kidney measuring 2.8 x 2.7 x 2.4 cm. This could represent a primary renal neoplasm. Would recommend dedicated MRI with contrast for further evaluation. Electronically Signed   By: Prudencio Pair M.D.   On: 01/20/2019 00:46    EKG:  EKG: normal EKG, normal sinus rhythm, unchanged from previous tracings. Vent. rate 92 BPM PR interval 196 ms QRS duration 84 ms QT/QTc 356/440 ms P-R-T axes -24 -18 90 IMPRESSION AND PLAN:   73 y.o. male with pertinent past medical history of CKD stage IV, diabetes mellitus type 2, diabetic neuropathy, GERD, hypertension, hypothyroidism, OSA, thrombocytopenia, and BPH presenting to the ED with chief complaints of dizziness and hypotension.  1. Dizziness - Likely due to hypotension - Admit to MedSurg unit - CT angio chest abdomen pelvis shows no PE however right kidney mass noted - IV fluids hydration to keep MAP>60 - Hold antihypertensives  2. Right kidney mass  - Discussed with Dr. Erlene Quan will follow up in clinic  3. Acute on chronic CKD stage IV - Holding nephrotoxins - IV fluids hydration - Continue to monitor renal function  4. Diabetes mellitus type 2 - Recent hemoglobin A1c 7.5 - Hold metformin - Continue Lantus glargine - SSI  6. Hypertension - Now hypotensive - Hold antihypertensive  7. HLD  + Goal LDL<100 - Simvastatin 42m PO qhs  8. Hypothyroidism -continue Synthroid  9. Diabetic neuropathy - Continue Cymbalta, hydrocodone, Lyrica  10. DVT prophylaxis - Heparin SubQ    All the records are reviewed and case discussed with ED provider. Management plans discussed with the patient, family and they are in agreement.  CODE STATUS: FULL  TOTAL TIME TAKING CARE OF THIS PATIENT: 50 minutes.    on 01/20/2019 at 4:24 AM   ERufina Falco DNP, FNP-BC [facility] Nurse Practitioner Between 7am to 6pm -  Pager [phone] After 6pm go to [email or web address] - password [email or web address]  Office  [phone] CC: Primary care physician; SLeone Haven MD

## 2019-01-20 NOTE — Discharge Summary (Signed)
[place] [facility] [place] NAME: Douglas [name]    MR#:  [id number]  DATE OF BIRTH:  06/18/1945  DATE OF ADMISSION:  01/19/2019   ADMITTING PHYSICIAN: No admitting provider for patient encounter.  DATE OF DISCHARGE: 01/20/2019 11:15 AM  PRIMARY CARE PHYSICIAN: [name], MD   ADMISSION DIAGNOSIS:   Dizziness  DISCHARGE DIAGNOSIS:   Active Problems:   Right kidney mass   SECONDARY DIAGNOSIS:   Past Medical History:  Diagnosis Date   Arthritis    Depression    Diabetes mellitus with complication (HCC)    Diabetic neuropathy ([place])    Diastolic dysfunction    a. TTE 07/2017: EF 60-65%, mild concentric LVH, no RWMA, Gr1DD, trivial AI, mildly dilated LA, RVSF normal, PASP normal   Edema    feet/legs   GERD (gastroesophageal reflux disease)    Gout    History of stress test    a. MV 06/2017: small in size, mild in severity, apical anterior and apical defect that was minimally reversible and most likely represented artifact and less likely ischemia/scar, LVEF 55-65%, low risk, probably normal stress test   Hypertension    Hypothyroidism    Kidney cysts    renal failure 2013   Kidney stones    Dr. Rogers Blocker   OSA (obstructive sleep apnea)    supplemental oxygen at night   Oxygen dependent    hs   [facility] spotted fever 12/10/2017   Positve IgG in titer on 11/20/17   S/P cardiac cath 1998   a. no obstructive disease   Syncope and collapse    Temporary low platelet count [facility])     HOSPITAL COURSE:   73 year old male with past medical history significant for diabetes, hypertension, CKD stage IV, BPH, severe diabetic neuropathy, sleep apnea and hypothyroidism presents to hospital secondary to dizziness and lightheadedness.  1.  Dizziness-secondary to hypotension -Patient had on and off dizzy spells and near syncopal episodes a few times before. -In the past his Cardura was dose reduced to 4 mg instead  of 8 with improvement. -Orthostatic blood pressure changes were negative in the hospital and blood pressure starting to get hypertensive here. -Resumed his losartan 25 mg twice daily. -Advised to hold his Cardura if continues to have dizzy spells at home and advised to follow-up with his PCP. -CT angiogram chest abdomen and pelvis showing no PE or other acute abnormalities. -In the past has been worked up for near syncopal episodes-cardiology work-up including stress test was negative according to the patient.  2.  Right kidney mass-patient has a 2.8 x 2.7 x 2.4 cm right upper pole kidney mass.  Discussed with urology who has recommended to follow-up with his primary urologist in 4 weeks.  Appointment has been set up.  3.  Diabetes mellitus-patient on glargine which will be continued.  Also on metformin  4.  Hypothyroidism-continue Synthroid  5.  Diabetic neuropathy-on Lyrica and Cymbalta  Patient has been up and ambulatory.  Denies any dizziness at this time. He would like to be discharged and we will arrange for outpatient follow-up.  DISCHARGE CONDITIONS:   Guarded  CONSULTS OBTAINED:   Treatment Team:  Hollice Espy, MD  DRUG ALLERGIES:   Allergies  Allergen Reactions   Bee Venom Swelling   Dye Fdc Red [Red Dye] Swelling and Other (See Comments)    Reaction: gout   Atenolol Other (See Comments)    Did not regulate blood pressure  Benadryl [Diphenhydramine Hcl (Sleep)] Other (See Comments)    Hyperactivity    Enalapril Itching    itching   Gabapentin Palpitations and Rash    rash   Hctz [Hydrochlorothiazide] Other (See Comments)    Decreased potassium   DISCHARGE MEDICATIONS:   Allergies as of 01/20/2019      Reactions   Bee Venom Swelling   Dye Fdc Red [red Dye] Swelling, Other (See Comments)   Reaction: gout   Atenolol Other (See Comments)   Did not regulate blood pressure   Benadryl [diphenhydramine Hcl (sleep)] Other (See Comments)    Hyperactivity   Enalapril Itching   itching   Gabapentin Palpitations, Rash   rash   Hctz [hydrochlorothiazide] Other (See Comments)   Decreased potassium      Medication List    STOP taking these medications   sildenafil 20 MG tablet Commonly known as: REVATIO     TAKE these medications   allopurinol 300 MG tablet Commonly known as: ZYLOPRIM TAKE 1 TABLET(300 MG) BY MOUTH DAILY What changed: See the new instructions.   aspirin 81 MG tablet Take 1 tablet (81 mg total) by mouth daily.   calcitRIOL 0.25 MCG capsule Commonly known as: ROCALTROL Take 0.25 mcg by mouth daily.   doxazosin 4 MG tablet Commonly known as: CARDURA Take 1 tablet (4 mg total) by mouth daily. What changed:   medication strength  See the new instructions.   DULoxetine 60 MG capsule Commonly known as: CYMBALTA TAKE 1 CAPSULE(60 MG) BY MOUTH DAILY What changed: See the new instructions.   esomeprazole 40 MG capsule Commonly known as: NexIUM Take 1 capsule (40 mg total) by mouth daily at 12 noon. What changed: when to take this   finasteride 5 MG tablet Commonly known as: PROSCAR Take 1 tablet (5 mg total) by mouth daily. What changed: when to take this   fluticasone 50 MCG/ACT nasal spray Commonly known as: FLONASE Place 2 sprays into both nostrils daily. What changed:   when to take this  reasons to take this   FreeStyle Freedom Lite w/Device Kit Use as directed   furosemide 40 MG tablet Commonly known as: LASIX TAKE 1 TABLET(40 MG) BY MOUTH DAILY What changed: See the new instructions.   glucose blood test strip Commonly known as: FREESTYLE LITE CHECK BLOOD SUGAR TWICE DAILY   HYDROcodone-acetaminophen 5-325 MG tablet Commonly known as: NORCO/VICODIN Take 1 tablet by mouth every 6 (six) hours as needed for moderate pain.   Insulin Glargine 100 UNIT/ML Solostar Pen Commonly known as: Lantus SoloStar Inject 48 units into the skin subcutaneously once daily.     levothyroxine 100 MCG tablet Commonly known as: SYNTHROID TAKE 1 TABLET(100 MCG) BY MOUTH DAILY BEFORE BREAKFAST What changed: See the new instructions.   loratadine 10 MG tablet Commonly known as: CLARITIN Take 10 mg by mouth daily as needed for allergies.   losartan 25 MG tablet Commonly known as: COZAAR TAKE 1 TABLET BY MOUTH TWICE DAILY   Magnesium 250 MG Tabs Take 1 tablet by mouth daily.   metFORMIN 500 MG 24 hr tablet Commonly known as: GLUCOPHAGE-XR TAKE 1 TABLET(500 MG) BY MOUTH TWICE DAILY What changed: See the new instructions.   Pen Needles 32G X 5 MM Misc 1 pen by Does not apply route daily.   pregabalin 100 MG capsule Commonly known as: LYRICA TAKE 1 CAPSULE BY MOUTH THREE TIMES DAILY What changed:   how much to take  when to take this  additional instructions  simvastatin 40 MG tablet Commonly known as: ZOCOR TAKE 1 TABLET(40 MG) BY MOUTH AT BEDTIME What changed: See the new instructions.        DISCHARGE INSTRUCTIONS:   1.  PCP follow-up in 1 to 2 weeks 2.  Urology follow-up in 4 weeks  DIET:   Cardiac diet  ACTIVITY:   Activity as tolerated  OXYGEN:   Home Oxygen: No.  Oxygen Delivery: room air  DISCHARGE LOCATION:   home   If you experience worsening of your admission symptoms, develop shortness of breath, life threatening emergency, suicidal or homicidal thoughts you must seek medical attention immediately by calling 911 or calling your MD immediately  if symptoms less severe.  You Must read complete instructions/literature along with all the possible adverse reactions/side effects for all the Medicines you take and that have been prescribed to you. Take any new Medicines after you have completely understood and accpet all the possible adverse reactions/side effects.   Please note  You were cared for by a hospitalist during your hospital stay. If you have any questions about your discharge medications or the care you  received while you were in the hospital after you are discharged, you can call the unit and asked to speak with the hospitalist on call if the hospitalist that took care of you is not available. Once you are discharged, your primary care physician will handle any further medical issues. Please note that NO REFILLS for any discharge medications will be authorized once you are discharged, as it is imperative that you return to your primary care physician (or establish a relationship with a primary care physician if you do not have one) for your aftercare needs so that they can reassess your need for medications and monitor your lab values.    On the day of Discharge:  VITAL SIGNS:   Blood pressure (!) 174/92, pulse 69, temperature 97.9 F (36.6 C), temperature source Oral, resp. rate 18, height 6' (1.829 m), weight 120.2 kg, SpO2 99 %.  PHYSICAL EXAMINATION:    GENERAL:  73 y.o.-year-old obese patient lying in the bed with no acute distress.  EYES: Pupils equal, round, reactive to light and accommodation. No scleral icterus. Extraocular muscles intact.  HEENT: Head atraumatic, normocephalic. Oropharynx and nasopharynx clear.  NECK:  Supple, no jugular venous distention. No thyroid enlargement, no tenderness.  LUNGS: Normal breath sounds bilaterally, no wheezing, rales,rhonchi or crepitation. No use of accessory muscles of respiration.  CARDIOVASCULAR: S1, S2 normal. No  rubs, or gallops.  2/6 systolic murmur is present ABDOMEN: Soft, non-tender, non-distended. Bowel sounds present. No organomegaly or mass.  EXTREMITIES: No pedal edema, cyanosis, or clubbing.  NEUROLOGIC: Cranial nerves II through XII are intact. Muscle strength equal in all extremities. Sensation intact. Gait not checked.  PSYCHIATRIC: The patient is alert and oriented x 3.  SKIN: No obvious rash, lesion, or ulcer.   DATA REVIEW:   CBC Recent Labs  Lab 01/19/19 1920  WBC 10.6*  HGB 11.8*  HCT 36.6*  PLT 258     Chemistries  Recent Labs  Lab 01/19/19 1920  NA 136  K 3.2*  CL 98  CO2 25  GLUCOSE 140*  BUN 15  CREATININE 1.49*  CALCIUM 9.1     Microbiology Results  Results for orders placed or performed during the hospital encounter of 01/19/19  SARS Coronavirus 2 [facility] order, Performed in [place] hospital lab)     Status: None   Collection Time: 01/20/19  2:05 AM  Result Value Ref Range Status   SARS Coronavirus 2 NEGATIVE NEGATIVE Final    Comment: (NOTE) If result is NEGATIVE SARS-CoV-2 target nucleic acids are NOT DETECTED. The SARS-CoV-2 RNA is generally detectable in upper and lower  respiratory specimens during the acute phase of infection. The lowest  concentration of SARS-CoV-2 viral copies this assay can detect is 250  copies / mL. A negative result does not preclude SARS-CoV-2 infection  and should not be used as the sole basis for treatment or other  patient management decisions.  A negative result may occur with  improper specimen collection / handling, submission of specimen other  than nasopharyngeal swab, presence of viral mutation(s) within the  areas targeted by this assay, and inadequate number of viral copies  (<250 copies / mL). A negative result must be combined with clinical  observations, patient history, and epidemiological information. If result is POSITIVE SARS-CoV-2 target nucleic acids are DETECTED. The SARS-CoV-2 RNA is generally detectable in upper and lower  respiratory specimens dur ing the acute phase of infection.  Positive  results are indicative of active infection with SARS-CoV-2.  Clinical  correlation with patient history and other diagnostic information is  necessary to determine patient infection status.  Positive results do  not rule out bacterial infection or co-infection with other viruses. If result is PRESUMPTIVE POSTIVE SARS-CoV-2 nucleic acids MAY BE PRESENT.   A presumptive positive result was obtained on the  submitted specimen  and confirmed on repeat testing.  While 2019 novel coronavirus  (SARS-CoV-2) nucleic acids may be present in the submitted sample  additional confirmatory testing may be necessary for epidemiological  and / or clinical management purposes  to differentiate between  SARS-CoV-2 and other Sarbecovirus currently known to infect humans.  If clinically indicated additional testing with an alternate test  methodology [id number]) is advised. The SARS-CoV-2 RNA is generally  detectable in upper and lower respiratory sp ecimens during the acute  phase of infection. The expected result is Negative. Fact Sheet for Patients:  StrictlyIdeas.no Fact Sheet for Healthcare Providers: BankingDealers.co.za This test is not yet approved or cleared by the [place] FDA and has been authorized for detection and/or diagnosis of SARS-CoV-2 by FDA under an Emergency Use Authorization (EUA).  This EUA will remain in effect (meaning this test can be used) for the duration of the COVID-19 declaration under Section 564(b)(1) of the Act, 21 U.S.C. section 360bbb-3(b)(1), unless the authorization is terminated or revoked sooner. Performed at [facility], [address] [place], [place] [address]     RADIOLOGY:  Dg Chest 2 View  Result Date: 01/19/2019 CLINICAL DATA:  Chest pain EXAM: CHEST - 2 VIEW COMPARISON:  07/11/2008 FINDINGS: The heart size and mediastinal contours are within normal limits. Both lungs are clear. The visualized skeletal structures are unremarkable. IMPRESSION: No active cardiopulmonary disease. Electronically Signed   By: Donavan Foil M.D.   On: 01/19/2019 19:59   Ct Angio Chest/abd/pel For Dissection W And/or W/wo  Result Date: 01/20/2019 CLINICAL DATA:  Chest and abdominal pain and dizziness EXAM: CT ANGIOGRAPHY CHEST, ABDOMEN AND PELVIS TECHNIQUE: Multidetector CT imaging through the chest, abdomen and pelvis  was performed using the standard protocol during bolus administration of intravenous contrast. Multiplanar reconstructed images and MIPs were obtained and reviewed to evaluate the vascular anatomy. CONTRAST:  149m OMNIPAQUE IOHEXOL 350 MG/ML SOLN COMPARISON:  None. FINDINGS: CTA CHEST FINDINGS Cardiovascular: --Heart: The heart size is normal. There is nopericardial effusion. Coronary artery calcifications are  seen. --Aorta: The course and caliber of the thoracic aorta are normal. There is no aortic atherosclerotic calcification. Precontrast images show no aortic intramural hematoma. There is no blood pool, dissection or penetrating ulcer demonstrated on arterial phase postcontrast imaging. There is origin of the left vertebral artery off the brachiocephalic. The proximal arch vessels are widely patent. --Pulmonary Arteries: Contrast timing is optimized for preferential opacification of the aorta. Within that limitation, normal central pulmonary arteries. Mediastinum/Nodes: No mediastinal, hilar or axillary lymphadenopathy. The visualized thyroid and thoracic esophageal course are unremarkable. Lungs/Pleura: No pulmonary nodules or masses. No pleural effusion or pneumothorax. No focal airspace consolidation. No focal pleural abnormality. Musculoskeletal: No chest wall abnormality. No acute osseous findings. Review of the MIP images confirms the above findings. CTA ABDOMEN AND PELVIS FINDINGS VASCULAR Aorta: Normal caliber aorta without aneurysm, dissection, vasculitis or hemodynamically significant stenosis. There is scattered aortic atherosclerosis. Celiac: No aneurysm, dissection or hemodynamically significant stenosis. Normal branching pattern SMA: Widely patent without dissection or stenosis. Renals: Single renal arteries bilaterally. No aneurysm, dissection, stenosis or evidence of fibromuscular dysplasia. IMA: Patent without abnormality. Inflow: Scattered atherosclerosis seen. No aneurysm, stenosis or  dissection. Veins: Normal course and caliber of the major veins. Assessment is otherwise limited by the arterial dominant contrast phase. Review of the MIP images confirms the above findings. NON-VASCULAR Hepatobiliary: There is a 1 cm low-density lesion in the posterior right liver lobe which was seen on a prior ultrasound, likely hepatic cyst. No visible biliary dilatation. Normal gallbladder. Pancreas: Normal contours without ductal dilatation. No peripancreatic fluid collection. Spleen: Normal arterial phase splenic enhancement pattern. Adrenals/Urinary Tract: --Adrenal glands: Normal. --Right kidney/ureter: There is interval growth in the slightly hyperdense lobular mass seen within the right kidney partially exophytic off the lower pole measuring 5.3 cm. This could represent a proteinaceous or hemorrhagic cyst. No hydronephrosis. --Left kidney/ureter: There is a new enhancing soft tissue mass seen within the left upper pole measuring 2.8 x 2.7 by 2.4 cm. No hydronephrosis or renal calculi are seen. --Urinary bladder: Unremarkable. Stomach/Bowel: --Stomach/Duodenum: No hiatal hernia or other gastric abnormality. Normal duodenal course and caliber. --Small bowel: No dilatation or inflammation. --Colon: No focal abnormality. --Appendix: Normal. Lymphatic:  No abdominal or pelvic lymphadenopathy. Reproductive: No free fluid in the pelvis. Musculoskeletal. No bony spinal canal stenosis or focal osseous abnormality. There is an S-shaped scoliotic curvature with ankylosis across the L1-L2 vertebral bodies. Other: None. Review of the MIP images confirms the above findings. IMPRESSION: 1. No acute intrathoracic or abdominal aortic abnormality. 2. Coronary artery calcifications. 3. New enhancing mass within the upper pole of the right kidney measuring 2.8 x 2.7 x 2.4 cm. This could represent a primary renal neoplasm. Would recommend dedicated MRI with contrast for further evaluation. Electronically Signed   By: Prudencio Pair M.D.   On: 01/20/2019 00:46     Management plans discussed with the patient, family and they are in agreement.  CODE STATUS:  Code Status History    Date Active Date Inactive Code Status Order ID Comments User Context   01/20/2019 0129 01/20/2019 1420 Full Code [id number]  Lang Snow, NP ED   Advance Care Planning Activity      TOTAL TIME TAKING CARE OF THIS PATIENT: 39 minutes.    Gladstone Lighter M.D on 01/20/2019 at 2:25 PM  Between 7am to 6pm - Pager - [phone]  After 6pm go to [email or web address] - [employer] [facility]  Office  [phone]  CC: Primary  care physician; [name], MD   Note: This dictation was prepared with Dragon dictation along with smaller phrase technology. Any transcriptional errors that result from this process are unintentional.

## 2019-01-20 NOTE — Telephone Encounter (Signed)
App made and mailed to patient 

## 2019-01-20 NOTE — Care Management CC44 (Signed)
Condition Code 44 Documentation Completed  Patient Details  Name: IVY MERIWETHER MRN: [id number] Date of Birth: [birth year]   Condition Code 44 given:  Yes Patient signature on Condition Code 44 notice:  Yes Documentation of 2 MD's agreement:  Yes Code 44 added to claim:  Yes    Tania Lois Ostrom, LCSW 01/20/2019, 10:28 AM

## 2019-01-21 ENCOUNTER — Telehealth: Payer: Self-pay

## 2019-01-21 NOTE — Telephone Encounter (Signed)
Contacted patient for transitional care management. Hospital follow up declined. Notes he is doing better and monitoring his blood pressure. He has scheduled with urology. States he will notify pcp as needed and keep previously scheduled appointment 03/14/19.

## 2019-02-02 ENCOUNTER — Other Ambulatory Visit: Payer: Self-pay | Admitting: Family Medicine

## 2019-02-02 DIAGNOSIS — E118 Type 2 diabetes mellitus with unspecified complications: Secondary | ICD-10-CM

## 2019-02-02 DIAGNOSIS — E1142 Type 2 diabetes mellitus with diabetic polyneuropathy: Secondary | ICD-10-CM

## 2019-02-10 ENCOUNTER — Other Ambulatory Visit: Payer: Self-pay

## 2019-02-10 ENCOUNTER — Other Ambulatory Visit: Payer: Self-pay | Admitting: Family Medicine

## 2019-02-10 DIAGNOSIS — N401 Enlarged prostate with lower urinary tract symptoms: Secondary | ICD-10-CM

## 2019-02-10 MED ORDER — FINASTERIDE 5 MG PO TABS: 5.0000 mg | ORAL_TABLET | Freq: Every day | ORAL | 3 refills | Status: DC

## 2019-02-11 ENCOUNTER — Other Ambulatory Visit: Payer: Self-pay

## 2019-02-18 DIAGNOSIS — D631 Anemia in chronic kidney disease: Secondary | ICD-10-CM | POA: Insufficient documentation

## 2019-02-18 DIAGNOSIS — N2581 Secondary hyperparathyroidism of renal origin: Secondary | ICD-10-CM | POA: Insufficient documentation

## 2019-02-18 DIAGNOSIS — N189 Chronic kidney disease, unspecified: Secondary | ICD-10-CM | POA: Insufficient documentation

## 2019-02-18 DIAGNOSIS — N179 Acute kidney failure, unspecified: Secondary | ICD-10-CM | POA: Insufficient documentation

## 2019-02-21 DIAGNOSIS — N1831 Chronic kidney disease, stage 3a: Secondary | ICD-10-CM | POA: Diagnosis not present

## 2019-02-21 DIAGNOSIS — R600 Localized edema: Secondary | ICD-10-CM | POA: Diagnosis not present

## 2019-02-21 DIAGNOSIS — D631 Anemia in chronic kidney disease: Secondary | ICD-10-CM | POA: Diagnosis not present

## 2019-02-21 DIAGNOSIS — N2889 Other specified disorders of kidney and ureter: Secondary | ICD-10-CM | POA: Diagnosis not present

## 2019-02-21 DIAGNOSIS — I10 Essential (primary) hypertension: Secondary | ICD-10-CM | POA: Diagnosis not present

## 2019-02-21 DIAGNOSIS — N2581 Secondary hyperparathyroidism of renal origin: Secondary | ICD-10-CM | POA: Diagnosis not present

## 2019-02-22 ENCOUNTER — Other Ambulatory Visit: Payer: Self-pay | Admitting: Nephrology

## 2019-02-28 ENCOUNTER — Ambulatory Visit: Payer: Medicare Other | Admitting: Urology

## 2019-03-03 ENCOUNTER — Other Ambulatory Visit: Payer: Self-pay | Admitting: Nephrology

## 2019-03-03 DIAGNOSIS — N2889 Other specified disorders of kidney and ureter: Secondary | ICD-10-CM

## 2019-03-07 ENCOUNTER — Other Ambulatory Visit: Payer: Self-pay

## 2019-03-07 ENCOUNTER — Encounter: Payer: Self-pay | Admitting: Urology

## 2019-03-07 ENCOUNTER — Ambulatory Visit (INDEPENDENT_AMBULATORY_CARE_PROVIDER_SITE_OTHER): Payer: Medicare Other | Admitting: Urology

## 2019-03-07 VITALS — BP 181/78 | HR 80 | Ht 72.0 in | Wt 265.0 lb

## 2019-03-07 DIAGNOSIS — N2889 Other specified disorders of kidney and ureter: Secondary | ICD-10-CM

## 2019-03-07 NOTE — Progress Notes (Signed)
03/07/2019 1:30 PM   Douglas [name] July 11, 1945 [id number]  Referring provider: [name], MD [address] [place],  [place] [address]  Chief Complaint  Patient presents with  . Benign Prostatic Hypertrophy    Urologic history: 1.  BPH with lower urinary tract symptoms             -Combination tx doxazosin/finasteride  2.  Erectile dysfunction   HPI: 73 y.o. male followed for BPH and erectile dysfunction.  He presented to the ED in early October 2020 complaining of dizziness and lightheadedness.  He was admitted overnight for observation.  He had a CT angio chest/abdomen/pelvis which showed a 2.8 x 2.7 x 2.4 cm enhancing right upper pole mass.  He has been known to have renal cysts however this was not seen on previous studies.  He has CKD and recently saw Dr. Holley Raring who ordered an abdominal MRI without contrast.   PMH: Past Medical History:  Diagnosis Date  . Arthritis   . Depression   . Diabetes mellitus with complication ([place])   . Diabetic neuropathy ([place])   . Diastolic dysfunction    a. TTE 07/2017: EF 60-65%, mild concentric LVH, no RWMA, Gr1DD, trivial AI, mildly dilated LA, RVSF normal, PASP normal  . Edema    feet/legs  . GERD (gastroesophageal reflux disease)   . Gout   . History of stress test    a. MV 06/2017: small in size, mild in severity, apical anterior and apical defect that was minimally reversible and most likely represented artifact and less likely ischemia/scar, LVEF 55-65%, low risk, probably normal stress test  . Hypertension   . Hypothyroidism   . Kidney cysts    renal failure 2013  . Kidney stones    Dr. Rogers Blocker  . OSA (obstructive sleep apnea)    supplemental oxygen at night  . Oxygen dependent    hs  . Rocky Mountain spotted fever 12/10/2017   Positve IgG in titer on 11/20/17  . S/P cardiac cath 1998   a. no obstructive disease  . Syncope and collapse   . Temporary low platelet count ([place])     Surgical History:  Past Surgical History:  Procedure Laterality Date  . ANAL FISSURE REPAIR    . BACK SURGERY  1977   rupture disc lumbar spine  . [facility] with [facility].   Marland Kitchen CATARACT EXTRACTION W/PHACO Right 07/16/2016   Procedure: CATARACT EXTRACTION PHACO AND INTRAOCULAR LENS PLACEMENT (IOC);  Surgeon: [name], MD;  Location: [facility] ORS;  Service: Ophthalmology;  Laterality: Right;  [place] 01:11 AP% 17.6 CDE 24.83 fluid pack lot # [id number]  . CATARACT EXTRACTION W/PHACO Left 08/13/2016   Procedure: CATARACT EXTRACTION PHACO AND INTRAOCULAR LENS PLACEMENT ([place]) suture placed in left eye at end of procedure;  Surgeon: [name], MD;  Location: [facility] ORS;  Service: Ophthalmology;  Laterality: Left;  [place] 01:55 AP% 22.7 CDE 53.56 fluid pack lot # [id number]  . FINGER AMPUTATION     partial  . KNEE SURGERY  1993   arthroscopy  . SHOULDER SURGERY Bilateral 1998   arthroscopic right, rotator cuff repair left    Home Medications:  Allergies as of 03/07/2019      Reactions   Bee Venom Swelling   Dye Fdc Red [red Dye] Swelling, Other (See Comments)   Reaction: gout   Atenolol Other (See Comments)   Did not regulate blood pressure   Benadryl [  diphenhydramine Hcl (sleep)] Other (See Comments)   Hyperactivity   Enalapril Itching   itching   Gabapentin Palpitations, Rash   rash   Hctz [hydrochlorothiazide] Other (See Comments)   Decreased potassium      Medication List       Accurate as of March 07, 2019  1:30 PM. If you have any questions, ask your nurse or doctor.        allopurinol 300 MG tablet Commonly known as: ZYLOPRIM TAKE 1 TABLET(300 MG) BY MOUTH DAILY What changed: See the new instructions.   aspirin 81 MG tablet Take 1 tablet (81 mg total) by mouth daily.   calcitRIOL 0.25 MCG capsule Commonly known as: ROCALTROL Take 0.25 mcg by mouth daily.   doxazosin 4 MG tablet Commonly known as: CARDURA Take 1  tablet (4 mg total) by mouth daily.   DULoxetine 60 MG capsule Commonly known as: CYMBALTA TAKE 1 CAPSULE(60 MG) BY MOUTH DAILY What changed: See the new instructions.   esomeprazole 40 MG capsule Commonly known as: NexIUM Take 1 capsule (40 mg total) by mouth daily at 12 noon. What changed: when to take this   finasteride 5 MG tablet Commonly known as: PROSCAR Take 1 tablet (5 mg total) by mouth at bedtime.   fluticasone 50 MCG/ACT nasal spray Commonly known as: FLONASE Place 2 sprays into both nostrils daily. What changed:   when to take this  reasons to take this   FreeStyle [place] Lite w/Device Kit Use as directed   furosemide 40 MG tablet Commonly known as: LASIX TAKE 1 TABLET(40 MG) BY MOUTH DAILY   glucose blood test strip Commonly known as: FREESTYLE LITE CHECK BLOOD SUGAR TWICE DAILY   HYDROcodone-acetaminophen 5-325 MG tablet Commonly known as: NORCO/VICODIN Take 1 tablet by mouth every 6 (six) hours as needed for moderate pain.   Insulin Glargine 100 UNIT/ML Solostar Pen Commonly known as: Lantus SoloStar Inject 48 units into the skin subcutaneously once daily.   levETIRAcetam 250 MG tablet Commonly known as: KEPPRA   levothyroxine 100 MCG tablet Commonly known as: SYNTHROID TAKE 1 TABLET(100 MCG) BY MOUTH DAILY BEFORE BREAKFAST What changed: See the new instructions.   loratadine 10 MG tablet Commonly known as: CLARITIN Take 10 mg by mouth daily as needed for allergies.   losartan 25 MG tablet Commonly known as: COZAAR TAKE 1 TABLET BY MOUTH TWICE DAILY   Magnesium 250 MG Tabs Take 1 tablet by mouth daily.   metFORMIN 500 MG 24 hr tablet Commonly known as: GLUCOPHAGE-XR TAKE 1 TABLET(500 MG) BY MOUTH TWICE DAILY   Pen Needles 32G X 5 MM Misc 1 pen by Does not apply route daily.   pregabalin 100 MG capsule Commonly known as: LYRICA TAKE 1 CAPSULE BY MOUTH THREE TIMES DAILY What changed:   how much to take  when to take this   additional instructions   simvastatin 40 MG tablet Commonly known as: ZOCOR TAKE 1 TABLET(40 MG) BY MOUTH AT BEDTIME       Allergies:  Allergies  Allergen Reactions  . Bee Venom Swelling  . Dye Fdc Red [Red Dye] Swelling and Other (See Comments)    Reaction: gout  . Atenolol Other (See Comments)    Did not regulate blood pressure  . Benadryl [Diphenhydramine Hcl (Sleep)] Other (See Comments)    Hyperactivity   . Enalapril Itching    itching  . Gabapentin Palpitations and Rash    rash  . Hctz [Hydrochlorothiazide] Other (See Comments)  Decreased potassium    Family History: Family History  Problem Relation Age of Onset  . Breast cancer Mother   . Lung cancer Mother   . Bone cancer Mother   . Heart Problems Mother   . Cancer Mother        breast, lung and rib  . AAA (abdominal aortic aneurysm) Mother   . Heart disease Mother   . Heart attack Father   . Heart disease Father   . Cancer Brother        esophageal  . Heart attack Brother   . Colon cancer Neg Hx     Social History:  reports that he has never smoked. He has never used smokeless tobacco. He reports that he does not drink alcohol or use drugs.  ROS: UROLOGY Frequent Urination?: No Hard to postpone urination?: No Burning/pain with urination?: No Get up at night to urinate?: No Leakage of urine?: No Urine stream starts and stops?: No Trouble starting stream?: No Do you have to strain to urinate?: No Blood in urine?: No Urinary tract infection?: No Sexually transmitted disease?: No Injury to kidneys or bladder?: No Painful intercourse?: No Weak stream?: No Erection problems?: No Penile pain?: No  Gastrointestinal Nausea?: No Vomiting?: No Indigestion/heartburn?: No Diarrhea?: No Constipation?: No  Constitutional Fever: No Night sweats?: No Weight loss?: No Fatigue?: No  Skin Skin rash/lesions?: No Itching?: No  Eyes Blurred vision?: No Double vision?: No  Ears/Nose/Throat  Sore throat?: No Sinus problems?: No  Hematologic/Lymphatic Swollen glands?: No Easy bruising?: No  Cardiovascular Leg swelling?: No Chest pain?: No  Respiratory Cough?: No Shortness of breath?: No  Endocrine Excessive thirst?: No  Musculoskeletal Back pain?: Yes Joint pain?: Yes  Neurological Headaches?: No Dizziness?: No  Psychologic Depression?: No Anxiety?: No  Physical Exam: BP (!) 181/78   Pulse 80   Ht 6' (1.829 m)   Wt 265 lb (120.2 kg)   BMI 35.94 kg/m   Constitutional:  Alert and oriented, No acute distress. HEENT: [place] AT, moist mucus membranes.  Trachea midline, no masses. Cardiovascular: No clubbing, cyanosis, or edema. Respiratory: Normal respiratory effort, no increased work of breathing.   Assessment & Plan:    - Small renal mass CT findings were discussed in detail with Douglas [name].  This does appear to be a solid mass and he was informed there is an approximately 70% or greater chance this is a small renal cell carcinoma.  Management options were discussed including active surveillance with periodic imaging, renal mass biopsy for risk stratification, surgical excision and CT-guided percutaneous ablation by interventional radiology.  He is scheduled for the MRI later this month.  He will think over these options and will touch base with him next week.  - Erectile dysfunction He did have questions regarding vacuum erection devices and was given website information.  Greater than 50% of this 15-minute visit was spent counseling the patient.   Abbie Sons, [place] [address], [place], [place] [address] [phone]

## 2019-03-11 ENCOUNTER — Ambulatory Visit: Payer: Medicare Other | Admitting: Family Medicine

## 2019-03-13 ENCOUNTER — Other Ambulatory Visit: Payer: Self-pay

## 2019-03-13 ENCOUNTER — Ambulatory Visit
Admission: RE | Admit: 2019-03-13 | Discharge: 2019-03-13 | Disposition: A | Discharge status: Home / Self Care | Payer: Medicare Other | Source: Ambulatory Visit | Attending: Nephrology | Admitting: Nephrology

## 2019-03-13 DIAGNOSIS — N2889 Other specified disorders of kidney and ureter: Secondary | ICD-10-CM | POA: Insufficient documentation

## 2019-03-14 ENCOUNTER — Ambulatory Visit: Payer: Medicare Other | Admitting: Family Medicine

## 2019-03-14 IMAGING — DX DG CLAVICLE*R*
2 series · 2 of 2 positions shown · non-contrast
Comparison: Chest x-ray of July 11, 2008

CLINICAL DATA: Right clavicular enlargement, progressive.

EXAM:
RIGHT CLAVICLE - 2+ VIEWS

[clavicle ap]
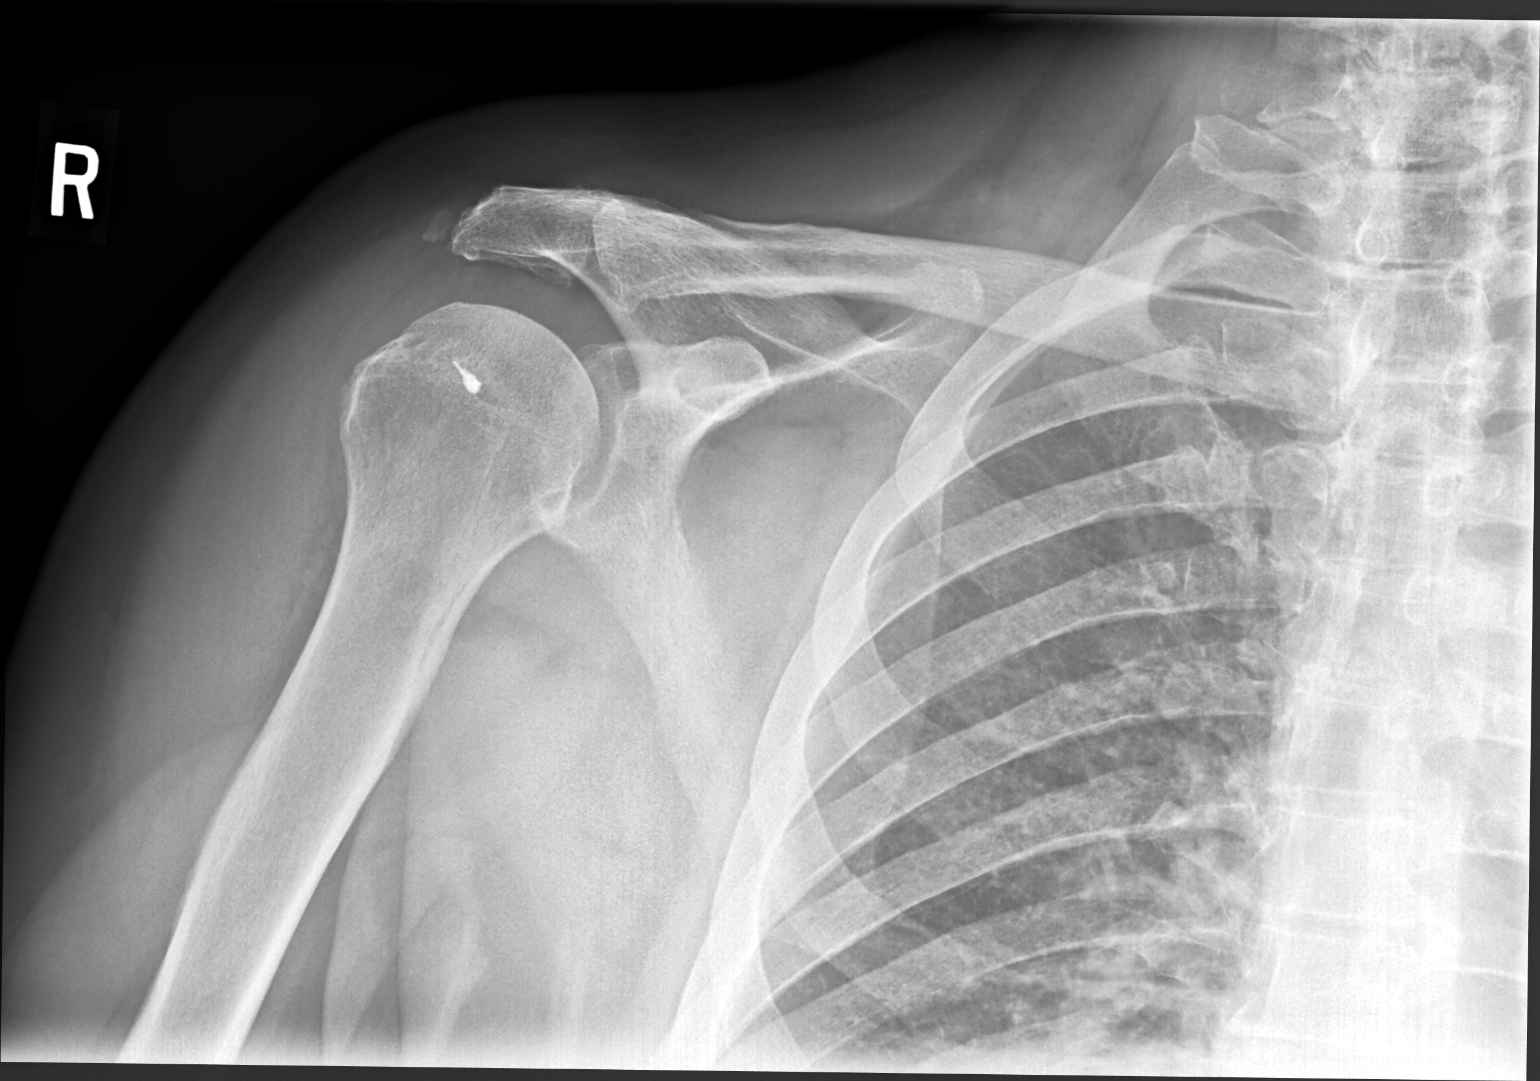

[clavicle tangential]
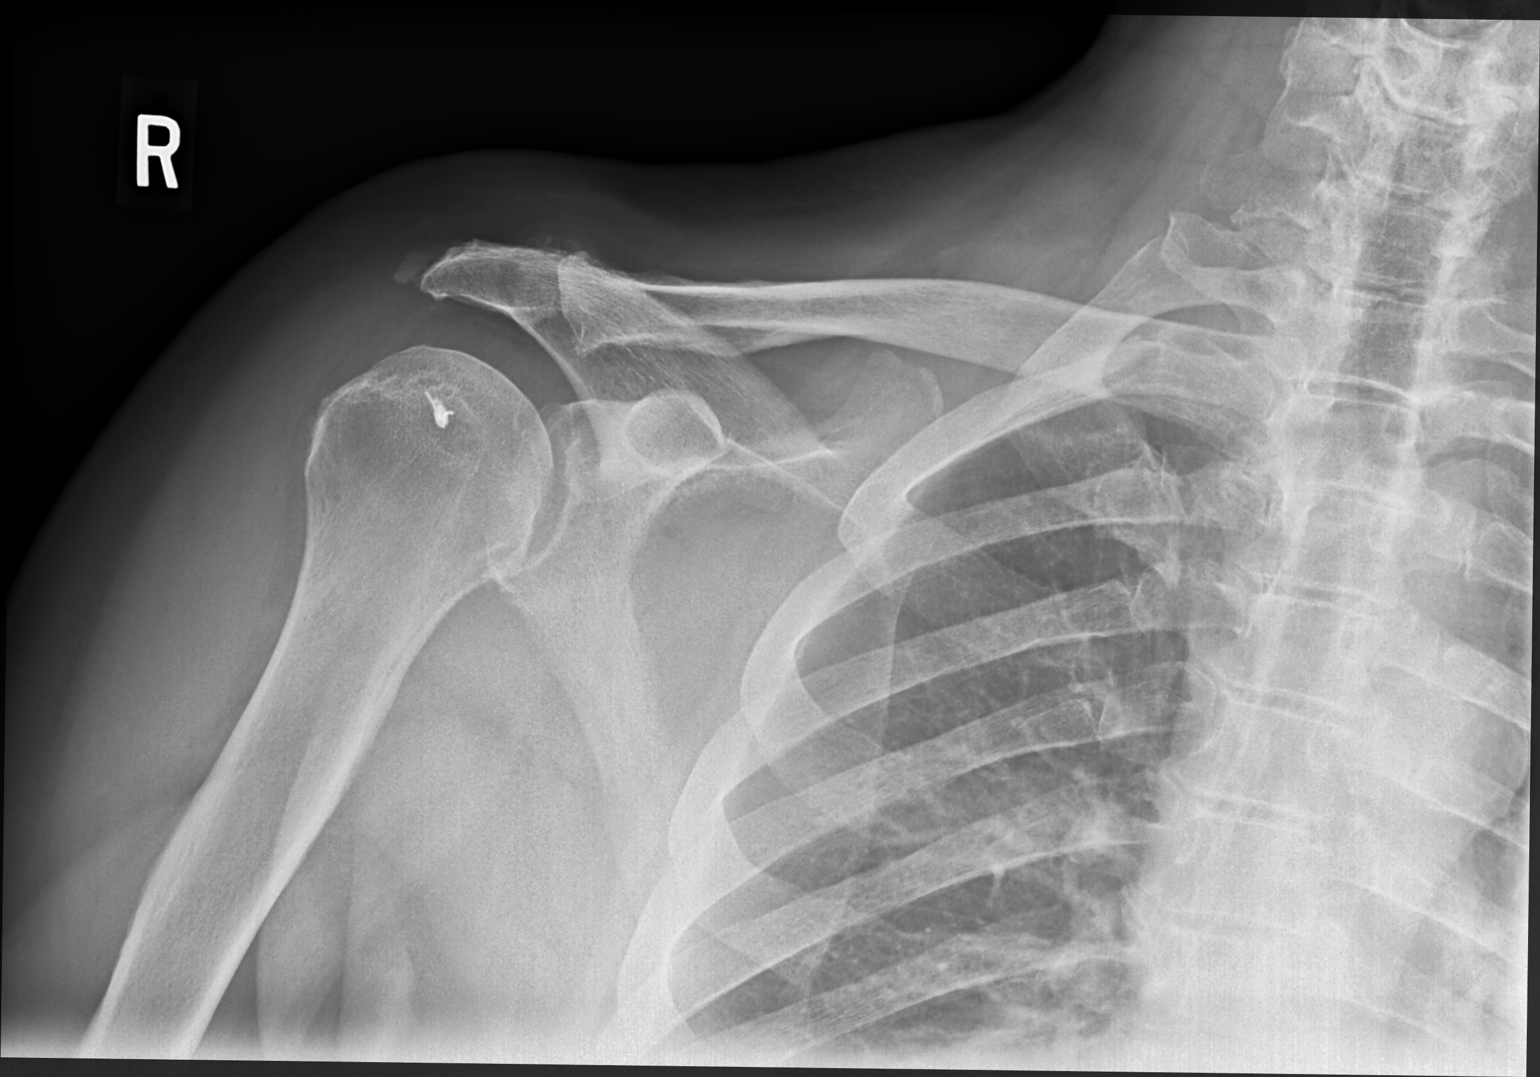

[2 of 2 positions shown; findings below may reference images not displayed]

FINDINGS: The clavicle is subjectively adequately mineralized. There is no
lytic or blastic lesion. The observed portions of the
sternoclavicular joint appear normal. The distal clavicle does not
appear abnormally enlarged. There is a spur arising from the
inferior articular margin of the distal clavicle. There is mild
degenerative change of the AC joint. A soft tissue calcification
lies lateral to the lateral margin of the acromion. The patient has
undergone previous rotator cuff repair.
IMPRESSION: Degenerative change of the AC joint. A spur has developed along the
inferior articular margin of the distal clavicle. More proximally
the clavicle exhibits no abnormality.

## 2019-03-29 ENCOUNTER — Telehealth: Payer: Self-pay | Admitting: Urology

## 2019-03-29 ENCOUNTER — Other Ambulatory Visit: Payer: Self-pay | Admitting: Urology

## 2019-03-29 DIAGNOSIS — N2889 Other specified disorders of kidney and ureter: Secondary | ICD-10-CM

## 2019-03-29 NOTE — Telephone Encounter (Signed)
I contacted Mr. Miner regarding his recent abdominal MRI.  Radiology only performed without contrast.  The size is stable when compared with the CT.  Although incompletely characterized without contrast it is felt to be suspicious for a renal neoplasm.  I again reviewed the options as we discussed on 03/07/2019.  The location may make renal mass biopsy or percutaneous treatment difficult however would get an opinion from IR.  At this point he desires to continue surveillance.  Will schedule a renal ultrasound April 2021.

## 2019-04-13 ENCOUNTER — Other Ambulatory Visit: Payer: Self-pay | Admitting: Family Medicine

## 2019-04-20 ENCOUNTER — Encounter: Payer: Self-pay | Admitting: Family Medicine

## 2019-04-20 ENCOUNTER — Ambulatory Visit (INDEPENDENT_AMBULATORY_CARE_PROVIDER_SITE_OTHER): Payer: Medicare Other | Admitting: Family Medicine

## 2019-04-20 ENCOUNTER — Other Ambulatory Visit: Payer: Self-pay

## 2019-04-20 VITALS — Ht 72.0 in | Wt 265.0 lb

## 2019-04-20 DIAGNOSIS — E1142 Type 2 diabetes mellitus with diabetic polyneuropathy: Secondary | ICD-10-CM | POA: Diagnosis not present

## 2019-04-20 DIAGNOSIS — N2889 Other specified disorders of kidney and ureter: Secondary | ICD-10-CM | POA: Diagnosis not present

## 2019-04-20 DIAGNOSIS — E039 Hypothyroidism, unspecified: Secondary | ICD-10-CM

## 2019-04-20 DIAGNOSIS — M170 Bilateral primary osteoarthritis of knee: Secondary | ICD-10-CM | POA: Diagnosis not present

## 2019-04-20 DIAGNOSIS — I10 Essential (primary) hypertension: Secondary | ICD-10-CM | POA: Diagnosis not present

## 2019-04-20 MED ORDER — HYDROCODONE-ACETAMINOPHEN 5-325 MG PO TABS: 1.0000 | ORAL_TABLET | Freq: Four times a day (QID) | ORAL | 0 refills | Status: DC | PRN

## 2019-04-20 MED ORDER — DOXAZOSIN MESYLATE 4 MG PO TABS: 2.0000 mg | ORAL_TABLET | Freq: Every day | ORAL | Status: DC

## 2019-04-20 NOTE — Assessment & Plan Note (Signed)
He has had more issues recently.  He is tapering off of Keppra currently.  I am going to check with our clinical pharmacist to see if it would be okay to increase his Lyrica dose while he is tapering down on the Keppra.  He can continue as needed use of Vicodin which he rarely uses.  Urine drug screen is up-to-date.  Controlled substance database reviewed.

## 2019-04-20 NOTE — Assessment & Plan Note (Signed)
Check TSH.  Continue Synthroid. 

## 2019-04-20 NOTE — Assessment & Plan Note (Signed)
Followed by urology.  I discussed the importance of him keeping his appointment for his ultrasound.

## 2019-04-20 NOTE — Assessment & Plan Note (Signed)
He will contact sports medicine physician for an evaluation.

## 2019-04-20 NOTE — Assessment & Plan Note (Signed)
Relatively well controlled.  He will continue his current regimen.  He recently had a BMP through nephrology.

## 2019-04-20 NOTE — Progress Notes (Signed)
Virtual Visit via telephone Note  This visit type was conducted due to national recommendations for restrictions regarding the COVID-19 pandemic (e.g. social distancing).  This format is felt to be most appropriate for this patient at this time.  All issues noted in this document were discussed and addressed.  No physical exam was performed (except for noted visual exam findings with Video Visits).   I connected with Douglas [name] today at  9:00 AM EST by a video enabled telemedicine application or telephone and verified that I am speaking with the correct person using two identifiers. Location patient: home Location provider: work Persons participating in the virtual visit: patient, provider, [place] (wife)  I discussed the limitations, risks, security and privacy concerns of performing an evaluation and management service by telephone and the availability of in person appointments. I also discussed with the patient that there may be a patient responsible charge related to this service. The patient expressed understanding and agreed to proceed.  Interactive audio and video telecommunications were attempted between this provider and patient, however failed, due to patient having technical difficulties OR patient did not have access to video capability.  We continued and completed visit with audio only.   Reason for visit: follow-up  HPI: HYPERTENSION  Disease Monitoring  Home BP Monitoring [id number] Chest pain- no    Dyspnea- no Medications  Compliance-  Taking losartan, lasix, cardura. Lightheadedness-  No  DIABETES Disease Monitoring: Blood Sugar ranges-153-188 Polyuria/phagia/dipsia- dipsia that is chronic      Optho- due Medications: Compliance- taking metformin, lantus 48 u daily Hypoglycemic symptoms- no  Neuropathic pain: Chronic issue.  Lyrica does help keep the neuropathic pain in his great toes down most of the time though he does have flares that  typically occur at night.  He will take Vicodin and that will resolve the pain.  His pain management specialist gave him Keppra to try though it has not been beneficial and they are progressively tapering him off of this.  He last saw them on 04/13/2019.  Renal mass: This is being followed by urology.  MRI without contrast was somewhat indeterminant.  They plan to obtain an ultrasound in April.  He notes no dysuria.  No flank pain.  Osteoarthritis: He has knee discomfort and notes they are popping and hurting at times.  He is seeing sports medicine and needs to schedule an appointment.    ROS: See pertinent positives and negatives per HPI.  Past Medical History:  Diagnosis Date  . Arthritis   . Depression   . Diabetes mellitus with complication ([place])   . Diabetic neuropathy ([place])   . Diastolic dysfunction    a. TTE 07/2017: EF 60-65%, mild concentric LVH, no RWMA, Gr1DD, trivial AI, mildly dilated LA, RVSF normal, PASP normal  . Edema    feet/legs  . GERD (gastroesophageal reflux disease)   . Gout   . History of stress test    a. MV 06/2017: small in size, mild in severity, apical anterior and apical defect that was minimally reversible and most likely represented artifact and less likely ischemia/scar, LVEF 55-65%, low risk, probably normal stress test  . Hypertension   . Hypothyroidism   . Kidney cysts    renal failure 2013  . Kidney stones    Dr. Rogers Blocker  . OSA (obstructive sleep apnea)    supplemental oxygen at night  . Oxygen dependent    hs  . Rocky Mountain spotted fever 12/10/2017   Positve IgG in  titer on 11/20/17  . S/P cardiac cath 1998   a. no obstructive disease  . Syncope and collapse   . Temporary low platelet count (HCC)     Past Surgical History:  Procedure Laterality Date  . ANAL FISSURE REPAIR    . BACK SURGERY  1977   rupture disc lumbar spine  . [facility] with [facility] and Vascular.   Marland Kitchen CATARACT EXTRACTION  W/PHACO Right 07/16/2016   Procedure: CATARACT EXTRACTION PHACO AND INTRAOCULAR LENS PLACEMENT (IOC);  Surgeon: [name], MD;  Location: [facility] ORS;  Service: Ophthalmology;  Laterality: Right;  [place] 01:11 AP% 17.6 CDE 24.83 fluid pack lot # [id number]  . CATARACT EXTRACTION W/PHACO Left 08/13/2016   Procedure: CATARACT EXTRACTION PHACO AND INTRAOCULAR LENS PLACEMENT ([place]) suture placed in left eye at end of procedure;  Surgeon: [name], MD;  Location: [facility] ORS;  Service: Ophthalmology;  Laterality: Left;  [place] 01:55 AP% 22.7 CDE 53.56 fluid pack lot # [id number]  . FINGER AMPUTATION     partial  . KNEE SURGERY  1993   arthroscopy  . SHOULDER SURGERY Bilateral 1998   arthroscopic right, rotator cuff repair left    Family History  Problem Relation Age of Onset  . Breast cancer Mother   . Lung cancer Mother   . Bone cancer Mother   . Heart Problems Mother   . Cancer Mother        breast, lung and rib  . AAA (abdominal aortic aneurysm) Mother   . Heart disease Mother   . Heart attack Father   . Heart disease Father   . Cancer Brother        esophageal  . Heart attack Brother   . Colon cancer Neg Hx     SOCIAL HX: Non-smoker   Current Outpatient Medications:  .  allopurinol (ZYLOPRIM) 300 MG tablet, TAKE 1 TABLET(300 MG) BY MOUTH DAILY (Patient taking differently: Take 300 mg by mouth daily. ), Disp: 90 tablet, Rfl: 1 .  aspirin 81 MG tablet, Take 1 tablet (81 mg total) by mouth daily., Disp: , Rfl:  .  Blood Glucose Monitoring Suppl (FREESTYLE FRE[place] LITE) W/DEVICE KIT, Use as directed, Disp: 1 each, Rfl: 0 .  calcitRIOL (ROCALTROL) 0.25 MCG capsule, Take 0.25 mcg by mouth daily. , Disp: , Rfl: 5 .  doxazosin (CARDURA) 4 MG tablet, Take 0.5 tablets (2 mg total) by mouth daily., Disp: , Rfl:  .  DULoxetine (CYMBALTA) 60 MG capsule, TAKE 1 CAPSULE(60 MG) BY MOUTH DAILY, Disp: 90 capsule, Rfl: 1 .  esomeprazole (NEXIUM) 40 MG capsule, Take 1 capsule (40 mg total) by  mouth daily at 12 noon. (Patient taking differently: Take 40 mg by mouth daily. ), Disp: 90 capsule, Rfl: 3 .  finasteride (PROSCAR) 5 MG tablet, Take 1 tablet (5 mg total) by mouth at bedtime., Disp: 90 tablet, Rfl: 3 .  fluticasone (FLONASE) 50 MCG/ACT nasal spray, Place 2 sprays into both nostrils daily. (Patient taking differently: Place 2 sprays into both nostrils daily as needed for allergies. ), Disp: 16 g, Rfl: 6 .  furosemide (LASIX) 40 MG tablet, TAKE 1 TABLET(40 MG) BY MOUTH DAILY, Disp: 90 tablet, Rfl: 0 .  glucose blood (FREESTYLE LITE) test strip, CHECK BLOOD SUGAR TWICE DAILY, Disp: 200 each, Rfl: 6 .  HYDROcodone-acetaminophen (NORCO/VICODIN) 5-325 MG tablet, Take 1 tablet by mouth every 6 (six) hours as needed for moderate pain., Disp: 30 tablet, Rfl: 0 .  Insulin Glargine (LANTUS SOLOSTAR) 100 UNIT/ML Solostar Pen, Inject 48 units into the skin subcutaneously once daily., Disp: 15 mL, Rfl: 11 .  Insulin Pen Needle (PEN NEEDLES) 32G X 5 MM MISC, 1 pen by Does not apply route daily., Disp: 100 each, Rfl: 3 .  levETIRAcetam (KEPPRA) 250 MG tablet, , Disp: , Rfl:  .  levothyroxine (SYNTHROID) 100 MCG tablet, TAKE 1 TABLET(100 MCG) BY MOUTH DAILY BEFORE BREAKFAST (Patient taking differently: Take 100 mcg by mouth daily before breakfast. ), Disp: 90 tablet, Rfl: 3 .  loratadine (CLARITIN) 10 MG tablet, Take 10 mg by mouth daily as needed for allergies. , Disp: , Rfl:  .  losartan (COZAAR) 25 MG tablet, TAKE 1 TABLET BY MOUTH TWICE DAILY, Disp: 180 tablet, Rfl: 0 .  Magnesium 250 MG TABS, Take 1 tablet by mouth daily. , Disp: , Rfl:  .  metFORMIN (GLUCOPHAGE-XR) 500 MG 24 hr tablet, TAKE 1 TABLET(500 MG) BY MOUTH TWICE DAILY, Disp: 180 tablet, Rfl: 3 .  pregabalin (LYRICA) 100 MG capsule, TAKE 1 CAPSULE BY MOUTH THREE TIMES DAILY (Patient taking differently: Take 100-200 mg by mouth 2 (two) times daily. 200 mg in the morning and 100 mg at night), Disp: 270 capsule, Rfl: 1 .  simvastatin  (ZOCOR) 40 MG tablet, TAKE 1 TABLET(40 MG) BY MOUTH AT BEDTIME, Disp: 90 tablet, Rfl: 3  EXAM: This was a telehealth telephone visit and thus no physical exam was completed.  ASSESSMENT AND PLAN:  Discussed the following assessment and plan:  Essential hypertension Relatively well controlled.  He will continue his current regimen.  He recently had a BMP through nephrology.  DM type 2 with diabetic peripheral neuropathy (HCC) Poorly controlled currently.  We will have him come in for an A1c.  Continue current regimen.  Hypothyroidism Check TSH.  Continue Synthroid.  Diabetic neuropathy ([place]) He has had more issues recently.  He is tapering off of Keppra currently.  I am going to check with our clinical pharmacist to see if it would be okay to increase his Lyrica dose while he is tapering down on the Keppra.  He can continue as needed use of Vicodin which he rarely uses.  Urine drug screen is up-to-date.  Controlled substance database reviewed.  Right kidney mass Followed by urology.  I discussed the importance of him keeping his appointment for his ultrasound.  Knee osteoarthritis He will contact sports medicine physician for an evaluation.   Orders Placed This Encounter  Procedures  . HgB A1c    Standing Status:   Future    Standing Expiration Date:   04/19/2020  . TSH    Standing Status:   Future    Standing Expiration Date:   04/19/2020    Meds ordered this encounter  Medications  . doxazosin (CARDURA) 4 MG tablet    Sig: Take 0.5 tablets (2 mg total) by mouth daily.  Marland Kitchen HYDROcodone-acetaminophen (NORCO/VICODIN) 5-325 MG tablet    Sig: Take 1 tablet by mouth every 6 (six) hours as needed for moderate pain.    Dispense:  30 tablet    Refill:  0     I discussed the assessment and treatment plan with the patient. The patient was provided an opportunity to ask questions and all were answered. The patient agreed with the plan and demonstrated an understanding of the  instructions.   The patient was advised to call back or seek an in-person evaluation if the symptoms worsen or if the condition fails to  improve as anticipated.  I provided 23 minutes of non-face-to-face time during this encounter.   [name], MD

## 2019-04-20 NOTE — Assessment & Plan Note (Signed)
Poorly controlled currently.  We will have him come in for an A1c.  Continue current regimen.

## 2019-04-21 ENCOUNTER — Other Ambulatory Visit: Payer: Self-pay

## 2019-04-21 ENCOUNTER — Ambulatory Visit (INDEPENDENT_AMBULATORY_CARE_PROVIDER_SITE_OTHER): Payer: Medicare Other

## 2019-04-21 DIAGNOSIS — E039 Hypothyroidism, unspecified: Secondary | ICD-10-CM | POA: Diagnosis not present

## 2019-04-21 DIAGNOSIS — E1142 Type 2 diabetes mellitus with diabetic polyneuropathy: Secondary | ICD-10-CM

## 2019-04-21 DIAGNOSIS — Z23 Encounter for immunization: Secondary | ICD-10-CM

## 2019-04-21 LAB — TSH: TSH: 3.72 u[IU]/mL (ref 0.35–4.50)

## 2019-04-21 LAB — HEMOGLOBIN A1C: Hgb A1c MFr Bld: 7.6 % — ABNORMAL HIGH (ref 4.6–6.5)

## 2019-04-25 NOTE — Progress Notes (Signed)
Lab results mailed to patient as requested.  Christopherjame Carnell,cma

## 2019-04-30 ENCOUNTER — Other Ambulatory Visit: Payer: Self-pay | Admitting: Family Medicine

## 2019-04-30 DIAGNOSIS — E1142 Type 2 diabetes mellitus with diabetic polyneuropathy: Secondary | ICD-10-CM

## 2019-05-03 ENCOUNTER — Other Ambulatory Visit: Payer: Self-pay | Admitting: Family Medicine

## 2019-05-03 DIAGNOSIS — E119 Type 2 diabetes mellitus without complications: Secondary | ICD-10-CM

## 2019-05-03 MED ORDER — EMPAGLIFLOZIN 10 MG PO TABS: 10.0000 mg | ORAL_TABLET | Freq: Every day | ORAL | 2 refills | Status: DC

## 2019-05-05 ENCOUNTER — Telehealth: Payer: Self-pay | Admitting: Family Medicine

## 2019-05-05 MED ORDER — PREGABALIN 100 MG PO CAPS: 200.0000 mg | ORAL_CAPSULE | Freq: Two times a day (BID) | ORAL | 1 refills | Status: DC

## 2019-05-05 NOTE — Telephone Encounter (Signed)
Please let the patient know that I heard back from our clinical pharmacist on his Lyrica.  She advised that we could try going up to 200 mg at night and he would continue 200 mg in the morning.  He needs to monitor for any drowsiness and if that occurs he would need to contact us.  I went ahead and sent this prescription to his pharmacy.  Thanks.

## 2019-05-05 NOTE — Telephone Encounter (Signed)
I called and informed the patient that the provider heard from the pharmacist and he is to take 200 mg of Lyrica at night and in the morning and if he experience any drowsiness to let us know. The patient understood.  Jim Lundin,cma

## 2019-05-11 ENCOUNTER — Telehealth: Payer: Self-pay

## 2019-05-12 ENCOUNTER — Other Ambulatory Visit: Payer: Self-pay | Admitting: Family Medicine

## 2019-05-15 NOTE — Telephone Encounter (Signed)
We can not check an A1c until 07/20/19.

## 2019-05-18 ENCOUNTER — Other Ambulatory Visit: Payer: Self-pay | Admitting: Family Medicine

## 2019-05-19 NOTE — Telephone Encounter (Signed)
I called the patient and he understood about getting the A1C in April. Patient stated he went to [place] for his last labs back on 11/12/2018 and he received a bill for $200 and he has insurance he wanted to know why he received a bill and who could he talk to about that.  Moncerrath Berhe,cma

## 2019-05-19 NOTE — Telephone Encounter (Signed)
Ok thanks.  Costas Sena,cma

## 2019-05-19 NOTE — Telephone Encounter (Signed)
I believe he would need to call [facility] since he went there for labs, though I will forward to [place] as well to see about this.

## 2019-05-21 ENCOUNTER — Other Ambulatory Visit: Payer: Self-pay | Admitting: Family Medicine

## 2019-06-06 ENCOUNTER — Other Ambulatory Visit: Payer: Self-pay | Admitting: Family Medicine

## 2019-06-06 DIAGNOSIS — E1142 Type 2 diabetes mellitus with diabetic polyneuropathy: Secondary | ICD-10-CM

## 2019-06-09 ENCOUNTER — Other Ambulatory Visit: Payer: Medicare Other

## 2019-06-10 ENCOUNTER — Other Ambulatory Visit: Payer: Self-pay

## 2019-06-10 ENCOUNTER — Other Ambulatory Visit (INDEPENDENT_AMBULATORY_CARE_PROVIDER_SITE_OTHER): Payer: Medicare Other

## 2019-06-10 DIAGNOSIS — E119 Type 2 diabetes mellitus without complications: Secondary | ICD-10-CM

## 2019-06-10 LAB — BASIC METABOLIC PANEL
BUN: 14 mg/dL (ref 6–23)
CO2: 30 mEq/L (ref 19–32)
Calcium: 9.6 mg/dL (ref 8.4–10.5)
Chloride: 100 mEq/L (ref 96–112)
Creatinine, Ser: 1.45 mg/dL (ref 0.40–1.50)
GFR: 47.57 mL/min — ABNORMAL LOW (ref >60.00)
Glucose, Bld: 111 mg/dL — ABNORMAL HIGH (ref 70–99)
Potassium: 4.2 mEq/L (ref 3.5–5.1)
Sodium: 137 mEq/L (ref 135–145)

## 2019-06-25 ENCOUNTER — Other Ambulatory Visit: Payer: Self-pay | Admitting: Family Medicine

## 2019-06-27 DIAGNOSIS — R600 Localized edema: Secondary | ICD-10-CM | POA: Diagnosis not present

## 2019-06-27 DIAGNOSIS — D631 Anemia in chronic kidney disease: Secondary | ICD-10-CM | POA: Diagnosis not present

## 2019-06-27 DIAGNOSIS — N2581 Secondary hyperparathyroidism of renal origin: Secondary | ICD-10-CM | POA: Diagnosis not present

## 2019-06-27 DIAGNOSIS — N1831 Chronic kidney disease, stage 3a: Secondary | ICD-10-CM | POA: Diagnosis not present

## 2019-06-27 DIAGNOSIS — I10 Essential (primary) hypertension: Secondary | ICD-10-CM | POA: Diagnosis not present

## 2019-07-31 ENCOUNTER — Other Ambulatory Visit: Payer: Self-pay | Admitting: Family Medicine

## 2019-07-31 DIAGNOSIS — E1142 Type 2 diabetes mellitus with diabetic polyneuropathy: Secondary | ICD-10-CM

## 2019-08-01 ENCOUNTER — Ambulatory Visit
Admission: RE | Admit: 2019-08-01 | Discharge: 2019-08-01 | Disposition: A | Discharge status: Home / Self Care | Payer: Medicare Other | Source: Ambulatory Visit | Attending: Urology | Admitting: Urology

## 2019-08-01 ENCOUNTER — Other Ambulatory Visit: Payer: Self-pay

## 2019-08-01 DIAGNOSIS — N281 Cyst of kidney, acquired: Secondary | ICD-10-CM | POA: Diagnosis not present

## 2019-08-01 DIAGNOSIS — N2889 Other specified disorders of kidney and ureter: Secondary | ICD-10-CM | POA: Insufficient documentation

## 2019-08-02 ENCOUNTER — Telehealth: Payer: Self-pay | Admitting: Urology

## 2019-08-02 DIAGNOSIS — N2889 Other specified disorders of kidney and ureter: Secondary | ICD-10-CM

## 2019-08-02 NOTE — Telephone Encounter (Addendum)
Notified patient as instructed, patient pleased. Discussed follow-up appointments, patient agrees. He would like Nine month u/s

## 2019-08-02 NOTE — Telephone Encounter (Signed)
Renal ultrasound shows a stable left upper pole renal mass.  If he desires to continue surveillance recommend a repeat ultrasound in 9 months.

## 2019-08-07 ENCOUNTER — Other Ambulatory Visit: Payer: Self-pay | Admitting: Family Medicine

## 2019-08-07 DIAGNOSIS — E118 Type 2 diabetes mellitus with unspecified complications: Secondary | ICD-10-CM

## 2019-08-10 ENCOUNTER — Ambulatory Visit: Payer: Medicare Other

## 2019-08-10 ENCOUNTER — Telehealth: Payer: Self-pay

## 2019-08-10 NOTE — Telephone Encounter (Signed)
Failed to reach patient for scheduled awv, no answer. No voicemail available. Please reschedule as appropriate.

## 2019-08-13 ENCOUNTER — Other Ambulatory Visit: Payer: Self-pay | Admitting: Family Medicine

## 2019-08-22 ENCOUNTER — Telehealth (INDEPENDENT_AMBULATORY_CARE_PROVIDER_SITE_OTHER): Payer: Medicare Other | Admitting: Family Medicine

## 2019-08-22 ENCOUNTER — Encounter: Payer: Self-pay | Admitting: Family Medicine

## 2019-08-22 ENCOUNTER — Other Ambulatory Visit: Payer: Self-pay

## 2019-08-22 DIAGNOSIS — J309 Allergic rhinitis, unspecified: Secondary | ICD-10-CM

## 2019-08-22 DIAGNOSIS — I10 Essential (primary) hypertension: Secondary | ICD-10-CM | POA: Diagnosis not present

## 2019-08-22 DIAGNOSIS — E118 Type 2 diabetes mellitus with unspecified complications: Secondary | ICD-10-CM

## 2019-08-22 DIAGNOSIS — A084 Viral intestinal infection, unspecified: Secondary | ICD-10-CM | POA: Insufficient documentation

## 2019-08-22 DIAGNOSIS — E1142 Type 2 diabetes mellitus with diabetic polyneuropathy: Secondary | ICD-10-CM

## 2019-08-22 MED ORDER — FLUTICASONE PROPIONATE 50 MCG/ACT NA SUSP: 2.0000 | Freq: Every day | NASAL | 6 refills | Status: DC

## 2019-08-22 MED ORDER — PREGABALIN 100 MG PO CAPS: 200.0000 mg | ORAL_CAPSULE | Freq: Two times a day (BID) | ORAL | 1 refills | Status: DC

## 2019-08-22 MED ORDER — METFORMIN HCL ER 500 MG PO TB24: ORAL_TABLET | ORAL | 3 refills | Status: DC

## 2019-08-22 MED ORDER — EMPAGLIFLOZIN 25 MG PO TABS: 25.0000 mg | ORAL_TABLET | Freq: Every day | ORAL | 3 refills | Status: DC

## 2019-08-22 NOTE — Progress Notes (Signed)
Virtual Visit via telephone Note  This visit type was conducted due to national recommendations for restrictions regarding the COVID-19 pandemic (e.g. social distancing).  This format is felt to be most appropriate for this patient at this time.  All issues noted in this document were discussed and addressed.  No physical exam was performed (except for noted visual exam findings with Video Visits).   I connected with Douglas [name] today at 11:00 AM EDT by telephone and verified that I am speaking with the correct person using two identifiers. Location patient: home Location provider: work Persons participating in the virtual visit: patient, provider  I discussed the limitations, risks, security and privacy concerns of performing an evaluation and management service by telephone and the availability of in person appointments. I also discussed with the patient that there may be a patient responsible charge related to this service. The patient expressed understanding and agreed to proceed.  Interactive audio and video telecommunications were attempted between this provider and patient, however failed, due to patient having technical difficulties OR patient did not have access to video capability.  We continued and completed visit with audio only.   Reason for visit: f/u  HPI: HYPERTENSION  Disease Monitoring  Home BP Monitoring 100-145/54-82 Chest pain- no    Dyspnea- no Medications  Compliance-  Taking cardura, losartan. Lightheadedness-  Still occasionally get this when BP is low  Edema- no  DIABETES Disease Monitoring: Blood Sugar ranges-119-162 Polyuria/phagia/dipsia- no      Medications: Compliance- taking lantus 48 u per day, jardiance, metformin  Neuropathy: Continues on Lyrica 200 mg twice daily.  He occasionally has to take hydrocodone for this though has only taken 2 since February.  He also uses clove oil and Salonpas.  Allergic rhinitis: Flonase is quite beneficial.  He  needs a refill.  Viral GI illness: Patient notes he had this back in the middle of April.  He had vomiting and diarrhea.  No taste disturbance.  No shortness of breath.  It lasted 48 hours and resolved on its own.  ROS: See pertinent positives and negatives per HPI.  Past Medical History:  Diagnosis Date  . Arthritis   . Depression   . Diabetes mellitus with complication ([place])   . Diabetic neuropathy ([place])   . Diastolic dysfunction    a. TTE 07/2017: EF 60-65%, mild concentric LVH, no RWMA, Gr1DD, trivial AI, mildly dilated LA, RVSF normal, PASP normal  . Edema    feet/legs  . GERD (gastroesophageal reflux disease)   . Gout   . History of stress test    a. MV 06/2017: small in size, mild in severity, apical anterior and apical defect that was minimally reversible and most likely represented artifact and less likely ischemia/scar, LVEF 55-65%, low risk, probably normal stress test  . Hypertension   . Hypothyroidism   . Kidney cysts    renal failure 2013  . Kidney stones    Dr. Rogers Blocker  . OSA (obstructive sleep apnea)    supplemental oxygen at night  . Oxygen dependent    hs  . [place] spotted fever 12/10/2017   Positve IgG in titer on 11/20/17  . S/P cardiac cath 1998   a. no obstructive disease  . Syncope and collapse   . Temporary low platelet count (HCC)     Past Surgical History:  Procedure Laterality Date  . ANAL FISSURE REPAIR    . BACK SURGERY  1977   rupture disc lumbar spine  .  [facility] with [facility].   Marland Kitchen CATARACT EXTRACTION W/PHACO Right 07/16/2016   Procedure: CATARACT EXTRACTION PHACO AND INTRAOCULAR LENS PLACEMENT (IOC);  Surgeon: [name], MD;  Location: [facility] ORS;  Service: Ophthalmology;  Laterality: Right;  [place] 01:11 AP% 17.6 CDE 24.83 fluid pack lot # [id number]  . CATARACT EXTRACTION W/PHACO Left 08/13/2016   Procedure: CATARACT EXTRACTION PHACO AND INTRAOCULAR LENS PLACEMENT ([place]) suture  placed in left eye at end of procedure;  Surgeon: [name], MD;  Location: [facility] ORS;  Service: Ophthalmology;  Laterality: Left;  [place] 01:55 AP% 22.7 CDE 53.56 fluid pack lot # [id number]  . FINGER AMPUTATION     partial  . KNEE SURGERY  1993   arthroscopy  . SHOULDER SURGERY Bilateral 1998   arthroscopic right, rotator cuff repair left    Family History  Problem Relation Age of Onset  . Breast cancer Mother   . Lung cancer Mother   . Bone cancer Mother   . Heart Problems Mother   . Cancer Mother        breast, lung and rib  . AAA (abdominal aortic aneurysm) Mother   . Heart disease Mother   . Heart attack Father   . Heart disease Father   . Cancer Brother        esophageal  . Heart attack Brother   . Colon cancer Neg Hx     SOCIAL HX: Non-smoker   Current Outpatient Medications:  .  allopurinol (ZYLOPRIM) 300 MG tablet, TAKE 1 TABLET(300 MG) BY MOUTH DAILY, Disp: 90 tablet, Rfl: 1 .  aspirin 81 MG tablet, Take 1 tablet (81 mg total) by mouth daily., Disp: , Rfl:  .  BD PEN NEEDLE NANO 2ND GEN 32G X 4 MM MISC, USE DAILY, Disp: 100 each, Rfl: 1 .  Blood Glucose Monitoring Suppl (FREESTYLE FREEDOM LITE) W/DEVICE KIT, Use as directed, Disp: 1 each, Rfl: 0 .  calcitRIOL (ROCALTROL) 0.25 MCG capsule, Take 0.25 mcg by mouth daily. , Disp: , Rfl: 5 .  doxazosin (CARDURA) 4 MG tablet, Take 0.5 tablets (2 mg total) by mouth daily., Disp: , Rfl:  .  DULoxetine (CYMBALTA) 60 MG capsule, TAKE 1 CAPSULE(60 MG) BY MOUTH DAILY, Disp: 90 capsule, Rfl: 1 .  esomeprazole (NEXIUM) 40 MG capsule, Take 1 capsule (40 mg total) by mouth daily at 12 noon. (Patient taking differently: Take 40 mg by mouth daily. ), Disp: 90 capsule, Rfl: 3 .  finasteride (PROSCAR) 5 MG tablet, Take 1 tablet (5 mg total) by mouth at bedtime., Disp: 90 tablet, Rfl: 3 .  fluticasone (FLONASE) 50 MCG/ACT nasal spray, Place 2 sprays into both nostrils daily., Disp: 16 g, Rfl: 6 .  furosemide (LASIX) 40 MG tablet,  TAKE 1 TABLET(40 MG) BY MOUTH DAILY, Disp: 90 tablet, Rfl: 0 .  glucose blood (FREESTYLE LITE) test strip, CHECK BLOOD SUGAR TWICE DAILY, Disp: 200 each, Rfl: 6 .  HYDROcodone-acetaminophen (NORCO/VICODIN) 5-325 MG tablet, Take 1 tablet by mouth every 6 (six) hours as needed for moderate pain., Disp: 30 tablet, Rfl: 0 .  Insulin Glargine (LANTUS SOLOSTAR) 100 UNIT/ML Solostar Pen, INJECT 48 UNITS UNDER THE SKIN EVERY DAY, Disp: 15 mL, Rfl: 11 .  levETIRAcetam (KEPPRA) 250 MG tablet, , Disp: , Rfl:  .  levothyroxine (SYNTHROID) 100 MCG tablet, TAKE 1 TABLET(100 MCG) BY MOUTH DAILY BEFORE BREAKFAST (Patient taking differently: Take 100 mcg by mouth daily before breakfast. ), Disp: 90 tablet, Rfl:  3 .  loratadine (CLARITIN) 10 MG tablet, Take 10 mg by mouth daily as needed for allergies. , Disp: , Rfl:  .  losartan (COZAAR) 25 MG tablet, TAKE 1 TABLET BY MOUTH TWICE DAILY, Disp: 180 tablet, Rfl: 0 .  Magnesium 250 MG TABS, Take 1 tablet by mouth daily. , Disp: , Rfl:  .  metFORMIN (GLUCOPHAGE-XR) 500 MG 24 hr tablet, Take 1 tablet (500 mg) by mouth in the morning, take 2 tablets (1000 mg) by mouth in the evening, Disp: 270 tablet, Rfl: 3 .  pregabalin (LYRICA) 100 MG capsule, Take 2 capsules (200 mg total) by mouth 2 (two) times daily., Disp: 360 capsule, Rfl: 1 .  simvastatin (ZOCOR) 40 MG tablet, TAKE 1 TABLET(40 MG) BY MOUTH AT BEDTIME, Disp: 90 tablet, Rfl: 3 .  empagliflozin (JARDIANCE) 25 MG TABS tablet, Take 25 mg by mouth daily before breakfast., Disp: 30 tablet, Rfl: 3  EXAM: This is a telephone visit and thus no physical exam was completed.  ASSESSMENT AND PLAN:  Discussed the following assessment and plan:  Essential hypertension Adequate control for age.  He will continue his current regimen.  He will monitor to see if his blood pressure slow or goes high and let us know if these things occur.  DM type 2 with diabetic peripheral neuropathy (HCC) Uncontrolled.  Increase Jardiance to  25 mg once daily.  He will continue his Lantus and Metformin at their current doses.  Plan for A1c at next visit.  Diabetic neuropathy (HCC) Continue Lyrica.  Allergic rhinitis Refill Flonase.  Viral gastroenteritis Episode of this about a month or so ago.  Resolved on its own.  Will monitor for any recurrent symptoms.   No orders of the defined types were placed in this encounter.   Meds ordered this encounter  Medications  . empagliflozin (JARDIANCE) 25 MG TABS tablet    Sig: Take 25 mg by mouth daily before breakfast.    Dispense:  30 tablet    Refill:  3  . pregabalin (LYRICA) 100 MG capsule    Sig: Take 2 capsules (200 mg total) by mouth 2 (two) times daily.    Dispense:  360 capsule    Refill:  1  . fluticasone (FLONASE) 50 MCG/ACT nasal spray    Sig: Place 2 sprays into both nostrils daily.    Dispense:  16 g    Refill:  6  . metFORMIN (GLUCOPHAGE-XR) 500 MG 24 hr tablet    Sig: Take 1 tablet (500 mg) by mouth in the morning, take 2 tablets (1000 mg) by mouth in the evening    Dispense:  270 tablet    Refill:  3     I discussed the assessment and treatment plan with the patient. The patient was provided an opportunity to ask questions and all were answered. The patient agreed with the plan and demonstrated an understanding of the instructions.   The patient was advised to call back or seek an in-person evaluation if the symptoms worsen or if the condition fails to improve as anticipated.  I provided 22 minutes of non-face-to-face time during this encounter.   [name], MD

## 2019-08-22 NOTE — Assessment & Plan Note (Signed)
Adequate control for age.  He will continue his current regimen.  He will monitor to see if his blood pressure slow or goes high and let us know if these things occur.

## 2019-08-22 NOTE — Assessment & Plan Note (Signed)
Refill Flonase. 

## 2019-08-22 NOTE — Assessment & Plan Note (Signed)
Continue Lyrica. 

## 2019-08-22 NOTE — Assessment & Plan Note (Signed)
Uncontrolled.  Increase Jardiance to 25 mg once daily.  He will continue his Lantus and Metformin at their current doses.  Plan for A1c at next visit.

## 2019-08-22 NOTE — Assessment & Plan Note (Signed)
Episode of this about a month or so ago.  Resolved on its own.  Will monitor for any recurrent symptoms.

## 2019-08-24 ENCOUNTER — Telehealth: Payer: Self-pay

## 2019-08-24 NOTE — Telephone Encounter (Signed)
I did a PA for Pregabalin on covermymeds  and I am awaiting a response.  Aijalon Kirtz,cma

## 2019-08-25 ENCOUNTER — Other Ambulatory Visit: Payer: Self-pay

## 2019-08-25 NOTE — Telephone Encounter (Signed)
[employer] called the Pregabalin will be covered

## 2019-08-25 NOTE — Telephone Encounter (Signed)
I spoke with pharmacy & they had lyrica ready for patient for $13. I called to let patient know that he could pick up at his convenience.

## 2019-09-05 ENCOUNTER — Ambulatory Visit (INDEPENDENT_AMBULATORY_CARE_PROVIDER_SITE_OTHER): Payer: Medicare Other

## 2019-09-05 VITALS — BP 138/78 | HR 80 | Ht 73.0 in | Wt 265.0 lb

## 2019-09-05 DIAGNOSIS — Z0000 Encounter for general adult medical examination without abnormal findings: Secondary | ICD-10-CM | POA: Diagnosis not present

## 2019-09-05 NOTE — Progress Notes (Signed)
Subjective:   Douglas [name] is a 74 y.o. male who presents for Medicare Annual/Subsequent preventive examination.  Review of Systems:  No ROS.  Medicare Wellness Virtual Visit.  Visual/audio telehealth visit.   Vital signs provided.  See social history for additional risk factors.   Cardiac Risk Factors include: advanced age (>27mn, >>94women);diabetes mellitus;male gender;hypertension     Objective:    Vitals: BP 138/78   Pulse 80   Ht _0  (1.854 m)   Wt 265 lb (120.2 kg)   BMI 34.96 kg/m   Body mass index is 34.96 kg/m.  Advanced Directives 09/05/2019 01/19/2019 12/10/2018 08/09/2018 07/03/2017 08/13/2016 07/02/2016  Does Patient Have a Medical Advance Directive? Yes No No Yes No No No  Type of AParamed[place]of [place] will - - [place] CityLiving will - - -  Does patient want to make changes to medical advance directive? No - Patient declined - - No - Patient declined - - -  Copy of [place] Chart? No - copy requested - - No - copy requested - - -  Would patient like information on creating a medical advance directive? - - - - No - Patient declined No - Patient declined No - Patient declined    Tobacco Social History   Tobacco Use  Smoking Status Never Smoker  Smokeless Tobacco Never Used     Counseling given: Not Answered   Clinical Intake:  Pre-visit preparation completed: Yes        Diabetes: Yes(Followed by pcp)  How often do you need to have someone help you when you read instructions, pamphlets, or other written materials from your doctor or pharmacy?: 1 - Never  Interpreter Needed?: No     Past Medical History:  Diagnosis Date  . Arthritis   . Depression   . Diabetes mellitus with complication ([place]   . Diabetic neuropathy ([place]   . Diastolic dysfunction    a. TTE 07/2017: EF 60-65%, mild concentric LVH, no RWMA, Gr1DD, trivial AI, mildly dilated LA, RVSF normal, PASP  normal  . Edema    feet/legs  . GERD (gastroesophageal reflux disease)   . Gout   . History of stress test    a. MV 06/2017: small in size, mild in severity, apical anterior and apical defect that was minimally reversible and most likely represented artifact and less likely ischemia/scar, LVEF 55-65%, low risk, probably normal stress test  . Hypertension   . Hypothyroidism   . Kidney cysts    renal failure 2013  . Kidney stones    Dr. WRogers Blocker . OSA (obstructive sleep apnea)    supplemental oxygen at night  . Oxygen dependent    hs  . Rocky [place] spotted fever 12/10/2017   Positve IgG in titer on 11/20/17  . S/P cardiac cath 1998   a. no obstructive disease  . Syncope and collapse   . Temporary low platelet count (HCC)    Past Surgical History:  Procedure Laterality Date  . ANAL FISSURE REPAIR    . BACK SURGERY  1977   rupture disc lumbar spine  [facility]   .Marland KitchenCATARACT EXTRACTION W/PHACO Right 07/16/2016   Procedure: CATARACT EXTRACTION PHACO AND INTRAOCULAR LENS PLACEMENT (IOC);  Surgeon: SEstill Cotta MD;  Location: [facility] ORS;  Service: Ophthalmology;  Laterality: Right;  [place]11 AP% 17.6 CDE 24.83 fluid pack lot #  [id number]  . CATARACT EXTRACTION W/PHACO Left 08/13/2016   Procedure: CATARACT EXTRACTION PHACO AND INTRAOCULAR LENS PLACEMENT (IOC) suture placed in left eye at end of procedure;  Surgeon: [name], MD;  Location: [facility] ORS;  Service: Ophthalmology;  Laterality: Left;  [place] 01:55 AP% 22.7 CDE 53.56 fluid pack lot # [id number]  . FINGER AMPUTATION     partial  . KNEE SURGERY  1993   arthroscopy  . SHOULDER SURGERY Bilateral 1998   arthroscopic right, rotator cuff repair left   Family History  Problem Relation Age of Onset  . Breast cancer Mother   . Lung cancer Mother   . Bone cancer Mother   . Heart Problems Mother   . Cancer Mother        breast, lung and rib  . AAA  (abdominal aortic aneurysm) Mother   . Heart disease Mother   . Heart attack Father   . Heart disease Father   . Cancer Brother        esophageal  . Heart attack Brother   . Colon cancer Neg Hx    Social History   Socioeconomic History  . Marital status: Married    Spouse name: [name]  . Number of children: 3  . Years of education: 44  . Highest education level: Not on file  Occupational History    Comment: retired  Tobacco Use  . Smoking status: Never Smoker  . Smokeless tobacco: Never Used  Substance and Sexual Activity  . Alcohol use: No  . Drug use: No  . Sexual activity: Yes  Other Topics Concern  . Not on file  Social History Narrative   Lives in [place] with his wife ([name]). No children. Three step children. No pets.      Work - Patient is retired. Maintenance supervisor.      School - One year college education.      Right handed.      [place], served in [place], no combat               Social Determinants of Health   Financial Resource Strain:   . Difficulty of Paying Living Expenses:   Food Insecurity:   . Worried About Charity fundraiser in the Last Year:   . Arboriculturist in the Last Year:   Transportation Needs:   . Film/video editor (Medical):   Marland Kitchen Lack of Transportation (Non-Medical):   Physical Activity:   . Days of Exercise per Week:   . Minutes of Exercise per Session:   Stress:   . Feeling of Stress :   Social Connections:   . Frequency of Communication with Friends and Family:   . Frequency of Social Gatherings with Friends and Family:   . Attends Religious Services:   . Active Member of Clubs or Organizations:   . Attends Archivist Meetings:   Marland Kitchen Marital Status:     Outpatient Encounter Medications as of 09/05/2019  Medication Sig  . allopurinol (ZYLOPRIM) 300 MG tablet TAKE 1 TABLET(300 MG) BY MOUTH DAILY  . aspirin 81 MG tablet Take 1 tablet (81 mg total) by mouth daily.  . BD PEN  NEEDLE NANO 2ND GEN 32G X 4 MM MISC USE DAILY  . Blood Glucose Monitoring Suppl (FREESTYLE FREEDOM LITE) W/DEVICE KIT Use as directed  . calcitRIOL (ROCALTROL) 0.25 MCG capsule Take 0.25 mcg by mouth daily.   . clove oil liquid Apply 1 application topically as needed.  Marland Kitchen  doxazosin (CARDURA) 4 MG tablet Take 0.5 tablets (2 mg total) by mouth daily.  . DULoxetine (CYMBALTA) 60 MG capsule TAKE 1 CAPSULE(60 MG) BY MOUTH DAILY  . empagliflozin (JARDIANCE) 25 MG TABS tablet Take 25 mg by mouth daily before breakfast.  . esomeprazole (NEXIUM) 40 MG capsule Take 1 capsule (40 mg total) by mouth daily at 12 noon. (Patient taking differently: Take 40 mg by mouth daily. )  . finasteride (PROSCAR) 5 MG tablet Take 1 tablet (5 mg total) by mouth at bedtime.  . fluticasone (FLONASE) 50 MCG/ACT nasal spray Place 2 sprays into both nostrils daily.  . furosemide (LASIX) 40 MG tablet TAKE 1 TABLET(40 MG) BY MOUTH DAILY  . glucose blood (FREESTYLE LITE) test strip CHECK BLOOD SUGAR TWICE DAILY  . HYDROcodone-acetaminophen (NORCO/VICODIN) 5-325 MG tablet Take 1 tablet by mouth every 6 (six) hours as needed for moderate pain.  . Insulin Glargine (LANTUS SOLOSTAR) 100 UNIT/ML Solostar Pen INJECT 48 UNITS UNDER THE SKIN EVERY DAY  . levothyroxine (SYNTHROID) 100 MCG tablet TAKE 1 TABLET(100 MCG) BY MOUTH DAILY BEFORE BREAKFAST (Patient taking differently: Take 100 mcg by mouth daily before breakfast. )  . loratadine (CLARITIN) 10 MG tablet Take 10 mg by mouth daily as needed for allergies.   Marland Kitchen losartan (COZAAR) 25 MG tablet TAKE 1 TABLET BY MOUTH TWICE DAILY  . metFORMIN (GLUCOPHAGE-XR) 500 MG 24 hr tablet Take 1 tablet (500 mg) by mouth in the morning, take 2 tablets (1000 mg) by mouth in the evening  . pregabalin (LYRICA) 100 MG capsule Take 2 capsules (200 mg total) by mouth 2 (two) times daily.  . simvastatin (ZOCOR) 40 MG tablet TAKE 1 TABLET(40 MG) BY MOUTH AT BEDTIME  . levETIRAcetam (KEPPRA) 250 MG tablet   .  Magnesium 250 MG TABS Take 1 tablet by mouth daily.    No facility-administered encounter medications on file as of 09/05/2019.    Activities of Daily Living In your present state of health, do you have any difficulty performing the following activities: 09/05/2019  Hearing? N  Vision? N  Difficulty concentrating or making decisions? N  Walking or climbing stairs? N  Dressing or bathing? N  Doing errands, shopping? Y  Comment He does not Physiological scientist and eating ? N  Using the Toilet? N  In the past six months, have you accidently leaked urine? N  Do you have problems with loss of bowel control? N  Managing your Medications? N  Managing your Finances? Y  Comment Wife assist  Housekeeping or managing your Housekeeping? N  Some recent data might be hidden    Patient Care Team: [name], MD as PCP - General (Family Medicine) Anthonette Legato, MD (Internal Medicine) Watt Climes, PA as Physician Assistant (Physician Assistant)   Assessment:   This is a routine wellness examination for Douglas [name].  I connected with Douglas [name] today by telephone and verified that I am speaking with the correct person using two identifiers. Location patient: home Location provider: work Persons participating in the virtual visit: patient, provider.   I discussed the limitations, risks, security and privacy concerns of performing an evaluation and management service by telephone and the availability of in person appointments. I also discussed with the patient that there may be a patient responsible charge related to this service. The patient expressed understanding and verbally consented to this telephonic visit.    Interactive audio and video telecommunications were attempted between this provider and patient, however failed,  due to patient having technical difficulties OR patient did not have access to video capability.  We continued and completed visit with audio only.  Some  vital signs may be absent or patient reported.   Time Spent with patient on telephone encounter: 30 minutes  Exercise Activities and Dietary recommendations Current Exercise Habits: Home exercise routine, Type of exercise: walking, Intensity: Mild  Goals Addressed            This Visit's Progress     Patient Stated   . HEMOGLOBIN A1C < 7.0 (pt-stated)       Kidney function at 160       Fall Risk: Fall Risk  09/05/2019 08/22/2019 04/20/2019 11/05/2018 08/09/2018  Falls in the past year? 0 0 0 0 0  Comment - - - - -  Number falls in past yr: 0 0 0 - -  Injury with Fall? - - - - -  Comment - - - - -  Risk Factor Category  - - - - -  Risk for fall due to : - - - - -  Risk for fall due to: Comment - - - - -  Follow up Falls evaluation completed Falls evaluation completed Falls evaluation completed - -    FALL RISK PREVENTION PERTAINING TO THE HOME:  Any stairs in or around the home? yes If so, are there any without handrails? no  Home free of loose throw rugs in walkways, pet beds, electrical cords, etc? Yes Adequate lighting in your home to reduce risk of falls? Yes  ASSISTIVE DEVICES UTILIZED TO PREVENT FALLS:  Life alert? No Use of a cane, walker or w/c? Yes, cane Grab bars in the bathroom? Yes Shower chair or bench in shower? Yes Elevated toilet seat or a handicapped toilet? Yes  TIMED UP AND GO:  Was the test performed? No, virtual visit   A1C- 04/21/19 (7.6)  Depression Screen PHQ 2/9 Scores 09/05/2019 08/22/2019 04/20/2019 11/05/2018  PHQ - 2 Score 0 0 0 0    Cognitive Function MMSE - Mini Mental State Exam 07/03/2017 07/02/2016 07/03/2015  Orientation to time _0 Orientation to Place _1 Registration _2 Attention/ Calculation _3 Recall _4 Language- name 2 objects _5 Language- repeat _6 Language- follow 3 step command _7 Language- read & follow direction _8 Write a sentence _9 Copy design _10 Total score _11 6CIT Screen 09/05/2019 08/09/2018  What Year? 0 points 0 points  What month? 0 points 0 points  What time? - 0 points  Count back from 20 - 0 points  Months in reverse 0 points 0 points  Repeat phrase - 0 points  Total Score - 0    Immunization History  Administered Date(s) Administered  . Fluad Quad(high Dose 65+) 04/21/2019  . Influenza, High Dose Seasonal PF 02/15/2016, 02/16/2017, 03/01/2018  . Influenza,inj,Quad PF,6+ Mos 01/04/2013, 02/20/2014, 02/27/2015  . Pneumococcal Conjugate-13 05/02/2013  . Pneumococcal Polysaccharide-23 01/05/2011  . Tdap 01/05/2008    Qualifies for Shingles Vaccine?  Education has been provided regarding the importance of this vaccine. Pt has been advised to call insurance company to determine out of pocket expense. Advised may also receive vaccine at local pharmacy or Health Dept. Verbalized acceptance and understanding. Deferred per patient request.   Tdap:  Education  has been provided regarding the importance of this vaccine. Advised may receive this vaccine at local pharmacy or Health Dept. Aware to provide a copy of the vaccination record if obtained from local pharmacy or Health Dept. Verbalized acceptance and understanding. Deferred per patient request.  Covid-19 Vaccine: declined.   Foot exam- followed by pcp. Denies changes in the feet. Followed by pcp.  Screening Tests Health Maintenance  Topic Date Due  . OPHTHALMOLOGY EXAM  04/20/2018  . FOOT EXAM  03/24/2019  . COVID-19 Vaccine (1) 09/21/2019 (Originally 05/27/1957)  . TETANUS/TDAP  09/04/2020 (Originally 01/04/2018)  . HEMOGLOBIN A1C  10/19/2019  . INFLUENZA VACCINE  11/13/2019  . Hepatitis C Screening  Completed  . PNA vac Low Risk Adult  Completed   Additional Screening:  Hepatitis C Screening: Completed 06/07/15  Vision Screening: Recommended annual ophthalmology exams for early detection of glaucoma and other disorders of the eye. Is the patient up to date with their  annual eye exam?  No. Encouraged to schedule.  Who is the provider or what is the name of the office in which the pt attends annual eye exams? [facility], Dr. Jeni Salles.    Dental Screening: Recommended annual dental exams for proper oral hygiene. Visits 6 months ago.  Community Resource Referral:  CRR required this visit?  No      Plan:  I have personally reviewed and addressed the Medicare Annual Wellness questionnaire and have noted the following in the patient's chart:  A. Medical and social history B. Use of alcohol, tobacco or illicit drugs  C. Current medications and supplements D. Functional ability and status E.  Nutritional status F.  Physical activity G. Advance directives H. List of other physicians I.  Hospitalizations, surgeries, and ER visits in previous 12 months J.  [place] such as hearing and vision if needed, cognitive and depression L. Referrals and appointments   I have reviewed and discussed with patient certain preventive protocols, quality metrics, and best practice recommendations.    Carie Caddy, LPN  6/44/0347 Nurse Health Advisor   Nurse Notes:   Keep all routine maintenance appointments.   Follow up 11/23/19

## 2019-09-05 NOTE — Patient Instructions (Addendum)
  Douglas [name] , Thank you for taking time to come for your Medicare Wellness Visit. I appreciate your ongoing commitment to your health goals. Please review the following plan we discussed and let me know if I can assist you in the future.   These are the goals we discussed: Goals      Patient Stated   . HEMOGLOBIN A1C < 7.0 (pt-stated)     Kidney function at 160       This is a list of the screening recommended for you and due dates:  Health Maintenance  Topic Date Due  . Eye exam for diabetics  04/20/2018  . Complete foot exam   03/24/2019  . COVID-19 Vaccine (1) 09/21/2019*  . Tetanus Vaccine  09/04/2020*  . Hemoglobin A1C  10/19/2019  . Flu Shot  11/13/2019  .  Hepatitis C: One time screening is recommended by Center for Disease Control  (CDC) for  adults born from [birth year] through 1965.   Completed  . Pneumonia vaccines  Completed  *Topic was postponed. The date shown is not the original due date.

## 2019-09-05 NOTE — Progress Notes (Signed)
I have reviewed the above note and agree.  Deborah Lazcano, M.D.  

## 2019-09-07 ENCOUNTER — Encounter: Payer: Self-pay | Admitting: Family

## 2019-09-07 ENCOUNTER — Other Ambulatory Visit: Payer: Self-pay

## 2019-09-07 ENCOUNTER — Ambulatory Visit (INDEPENDENT_AMBULATORY_CARE_PROVIDER_SITE_OTHER): Payer: Medicare Other | Admitting: Family

## 2019-09-07 VITALS — BP 140/80 | HR 87 | Ht 73.0 in | Wt 266.8 lb

## 2019-09-07 DIAGNOSIS — I951 Orthostatic hypotension: Secondary | ICD-10-CM | POA: Diagnosis not present

## 2019-09-07 DIAGNOSIS — I10 Essential (primary) hypertension: Secondary | ICD-10-CM | POA: Diagnosis not present

## 2019-09-07 DIAGNOSIS — E782 Mixed hyperlipidemia: Secondary | ICD-10-CM

## 2019-09-07 NOTE — Progress Notes (Signed)
Office Visit    Patient Name: Douglas [name] Date of Encounter: 09/07/2019  Primary Care Provider:  [name], MD Primary Cardiologist:  Kathlyn Sacramento, MD Electrophysiologist:  None   Chief Complaint    [name] Heino is a 74 y.o. male with a hx of orthostatic dizziness and syncope, exertional dyspnea, HTN, HLD, palpitations presents today for follow up of orthostasis and HTN   Past Medical History    Past Medical History:  Diagnosis Date  . Arthritis   . Depression   . Diabetes mellitus with complication ([place])   . Diabetic neuropathy ([place])   . Diastolic dysfunction    a. TTE 07/2017: EF 60-65%, mild concentric LVH, no RWMA, Gr1DD, trivial AI, mildly dilated LA, RVSF normal, PASP normal  . Edema    feet/legs  . GERD (gastroesophageal reflux disease)   . Gout   . History of stress test    a. MV 06/2017: small in size, mild in severity, apical anterior and apical defect that was minimally reversible and most likely represented artifact and less likely ischemia/scar, LVEF 55-65%, low risk, probably normal stress test  . Hypertension   . Hypothyroidism   . Kidney cysts    renal failure 2013  . Kidney stones    Dr. Rogers Blocker  . OSA (obstructive sleep apnea)    supplemental oxygen at night  . Oxygen dependent    hs  . [place] spotted fever 12/10/2017   Positve IgG in titer on 11/20/17  . S/P cardiac cath 1998   a. no obstructive disease  . Syncope and collapse   . Temporary low platelet count (HCC)    Past Surgical History:  Procedure Laterality Date  . ANAL FISSURE REPAIR    . BACK SURGERY  1977   rupture disc lumbar spine  . [facility] with [facility].   Marland Kitchen CATARACT EXTRACTION W/PHACO Right 07/16/2016   Procedure: CATARACT EXTRACTION PHACO AND INTRAOCULAR LENS PLACEMENT (IOC);  Surgeon: [name], MD;  Location: [facility] ORS;  Service: Ophthalmology;  Laterality: Right;  [place] 01:11 AP% 17.6  CDE 24.83 fluid pack lot # [id number]  . CATARACT EXTRACTION W/PHACO Left 08/13/2016   Procedure: CATARACT EXTRACTION PHACO AND INTRAOCULAR LENS PLACEMENT ([place]) suture placed in left eye at end of procedure;  Surgeon: [name], MD;  Location: [facility] ORS;  Service: Ophthalmology;  Laterality: Left;  [place] 01:55 AP% 22.7 CDE 53.56 fluid pack lot # [id number]  . FINGER AMPUTATION     partial  . KNEE SURGERY  1993   arthroscopy  . SHOULDER SURGERY Bilateral 1998   arthroscopic right, rotator cuff repair left    Allergies  Allergies  Allergen Reactions  . Bee Venom Swelling  . Dye Fdc Red [Red Dye] Swelling and Other (See Comments)    Reaction: gout  . Atenolol Other (See Comments)    Did not regulate blood pressure  . Benadryl [Diphenhydramine Hcl (Sleep)] Other (See Comments)    Hyperactivity   . Enalapril Itching    itching  . Gabapentin Palpitations and Rash    rash  . Hctz [Hydrochlorothiazide] Other (See Comments)    Decreased potassium    History of Present Illness    Douglas [name] is a 74 y.o. male with a hx of  orthostatic dizziness and syncope, exertional dyspnea, HTN, HLD, palpitations, DM2, CKD, BPH. He was last seen 11/2017 by Dr. Fletcher Anon.  Previous episode of lightheadedness and syncope thought  to be due to orthostatic hypotension while on high dose Cardura. He reports history of abnormal stress test in 1998 followed by catheterization showing no obstructive disease by Dr. Rex Kras. Lexiscan Myoview 06/2017 was low risk with normal LVEF 55-65% and small, mild apical anterior and apical defect minimally reversible thought to represent artifact. Echo 07/2017 with LVEF 60-65%, gr1DD, trivial AI, LA mildly dilated.  Long term monitor 08/2017 showed NSR with average HR 84 bpm, rare PVC/PAC, and short runs of SVT with longest lasting 11 beats.   Reports feeling overall well. Stays busy working in the yard. Reports no shortness of breath nor dyspnea on exertion. Reports no  chest pain, pressure, or tightness. No edema, orthopnea, PND. Reports no palpitations. Tells me he will have intermittent lightheadedness when he is dehydrated, but no near syncope nor syncope. Does try to stay well hydrated using Gatorade Zero during the summer months especially while he is mowing.   Checks BP at home daily with reading this morning 117/60. Reports readings are okay to mildly elevated in the morning and then better in the afternoon. Will get occasional low readings. Overall much better since Cardura dose was decreased.  Reports his swelling is well controlled on current Lasix dose.  EKGs/Labs/Other Studies Reviewed:   The following studies were reviewed today:   EKG:  EKG is ordered today.  The ekg ordered today demonstrates NSR 83 bpm with nonspecific t wave changes and no acute ST/T wave changes  Recent Labs: 11/12/2018: ALT 23 01/19/2019: Hemoglobin 11.8; Platelets 258 04/21/2019: TSH 3.72 06/10/2019: BUN 14; Creatinine, Ser 1.45; Potassium 4.2; Sodium 137  Recent Lipid Panel    Component Value Date/Time   CHOL 142 11/12/2018 1142   TRIG 174 (H) 11/12/2018 1142   HDL 49 11/12/2018 1142   CHOLHDL 2.9 11/12/2018 1142   CHOLHDL 3 08/19/2017 1005   VLDL 30.8 08/19/2017 1005   LDLCALC 58 11/12/2018 1142    Home Medications   Current Meds  Medication Sig  . allopurinol (ZYLOPRIM) 300 MG tablet TAKE 1 TABLET(300 MG) BY MOUTH DAILY  . aspirin 81 MG tablet Take 1 tablet (81 mg total) by mouth daily.  . BD PEN NEEDLE NANO 2ND GEN 32G X 4 MM MISC USE DAILY  . Blood Glucose Monitoring Suppl (FREESTYLE FREEDOM LITE) W/DEVICE KIT Use as directed  . calcitRIOL (ROCALTROL) 0.25 MCG capsule Take 0.25 mcg by mouth daily.   . clove oil liquid Apply 1 application topically as needed.  . doxazosin (CARDURA) 4 MG tablet Take 0.5 tablets (2 mg total) by mouth daily.  . DULoxetine (CYMBALTA) 60 MG capsule TAKE 1 CAPSULE(60 MG) BY MOUTH DAILY  . empagliflozin (JARDIANCE) 25 MG TABS  tablet Take 25 mg by mouth daily before breakfast.  . esomeprazole (NEXIUM) 40 MG capsule Take 1 capsule (40 mg total) by mouth daily at 12 noon. (Patient taking differently: Take 40 mg by mouth daily. )  . finasteride (PROSCAR) 5 MG tablet Take 1 tablet (5 mg total) by mouth at bedtime.  . fluticasone (FLONASE) 50 MCG/ACT nasal spray Place 2 sprays into both nostrils daily.  . furosemide (LASIX) 40 MG tablet TAKE 1 TABLET(40 MG) BY MOUTH DAILY  . glucose blood (FREESTYLE LITE) test strip CHECK BLOOD SUGAR TWICE DAILY  . HYDROcodone-acetaminophen (NORCO/VICODIN) 5-325 MG tablet Take 1 tablet by mouth every 6 (six) hours as needed for moderate pain.  . Insulin Glargine (LANTUS SOLOSTAR) 100 UNIT/ML Solostar Pen INJECT 48 UNITS UNDER THE SKIN EVERY DAY  .  levETIRAcetam (KEPPRA) 250 MG tablet   . levothyroxine (SYNTHROID) 100 MCG tablet TAKE 1 TABLET(100 MCG) BY MOUTH DAILY BEFORE BREAKFAST (Patient taking differently: Take 100 mcg by mouth daily before breakfast. )  . loratadine (CLARITIN) 10 MG tablet Take 10 mg by mouth daily as needed for allergies.   Marland Kitchen losartan (COZAAR) 25 MG tablet TAKE 1 TABLET BY MOUTH TWICE DAILY  . Magnesium 250 MG TABS Take 1 tablet by mouth daily.   . metFORMIN (GLUCOPHAGE-XR) 500 MG 24 hr tablet Take 1 tablet (500 mg) by mouth in the morning, take 2 tablets (1000 mg) by mouth in the evening  . pregabalin (LYRICA) 100 MG capsule Take 2 capsules (200 mg total) by mouth 2 (two) times daily.  . simvastatin (ZOCOR) 40 MG tablet TAKE 1 TABLET(40 MG) BY MOUTH AT BEDTIME      Review of Systems     Review of Systems  Constitution: Negative for chills, fever and malaise/fatigue.  Cardiovascular: Negative for chest pain, dyspnea on exertion, leg swelling, near-syncope, orthopnea, palpitations and syncope.  Respiratory: Negative for cough, shortness of breath and wheezing.   Gastrointestinal: Negative for nausea and vomiting.  Neurological: Negative for dizziness,  light-headedness and weakness.   All other systems reviewed and are otherwise negative except as noted above.  Physical Exam    VS:  BP 140/80 (BP Location: Left Arm, Patient Position: Sitting, Cuff Size: Normal)   Pulse 87   Wt 266 lb 12.8 oz (121 kg)   BMI 35.20 kg/m  , BMI Body mass index is 35.2 kg/m. GEN: Well nourished, well developed, in no acute distress. HEENT: normal. Neck: Supple, no JVD, carotid bruits, or masses. Cardiac: RRR, no murmurs, rubs, or gallops. No clubbing, cyanosis, edema.  Radials/DP/PT 2+ and equal bilaterally.  Respiratory:  Respirations regular and unlabored, clear to auscultation bilaterally. GI: Soft, nontender, nondistended, BS + x 4. MS: No deformity or atrophy. Skin: Warm and dry, no rash. Neuro:  Strength and sensation are intact. Psych: Normal affect.   Assessment & Plan    1. HTN - BP well controlled on home monitoring. Continue present antihypertensive regimen. Low sodium diet and regular cardiovascular exercise recommended.  2. HLD - Lipid profile 11/12/18 with LDL 58. Continue Zimvastatin 29m daily.  3. Orthostatic hypotension - Encouraged to stay well hydrated and make position changes carefully. Monitors BP carefully at home. Reports symptoms are well controlled. No near syncope nor syncope.  4. Syncope - No recurrent syncope.  5. DM2 - Follows with primary care. Appreciate inclusion of SGLT2i for cardioprotective benefit.   Disposition: Follow up in 1 year(s) with Dr. ATor Netters NP 09/07/2019, 9:46 PM

## 2019-09-07 NOTE — Patient Instructions (Signed)
Medication Instructions:  No medication changes today.   *If you need a refill on your cardiac medications before your next appointment, please call your pharmacy*   Lab Work: No lab work today.   Testing/Procedures: Your EKG today shows normal sinus rhythm with no changes which is a great result!  Follow-Up: At [facility], you and your health needs are our priority.  As part of our continuing mission to provide you with exceptional heart care, we have created designated Provider Care Teams.  These Care Teams include your primary Cardiologist (physician) and Advanced Practice Providers (APPs -  Physician Assistants and Nurse Practitioners) who all work together to provide you with the care you need, when you need it.  We recommend signing up for the patient portal called "MyChart".  Sign up information is provided on this After Visit Summary.  MyChart is used to connect with patients for Virtual Visits (Telemedicine).  Patients are able to view lab/test results, encounter notes, upcoming appointments, etc.  Non-urgent messages can be sent to your provider as well.   To learn more about what you can do with MyChart, go to [email or web address].    Your next appointment:   1 year(s)  The format for your next appointment:   In Person  Provider:   You may see Kathlyn Sacramento, MD or one of the following Advanced Practice Providers on your designated Care Team:    Murray Hodgkins, NP  Christell Faith, PA-C  Marrianne Mood, PA-C  Laurann Montana, NP   Other Instructions  Continue to monitor your blood pressure. If your blood pressure is consistently more than 140/90 please call us or Dr. [name].  May sure you stay well hydrated. This will help prevent lightheadedness.   Orthostatic Hypotension Blood pressure is a measurement of how strongly, or weakly, your blood is pressing against the walls of your arteries. Orthostatic hypotension is a sudden drop in blood pressure that  happens when you quickly change positions, such as when you get up from sitting or lying down. Arteries are blood vessels that carry blood from your heart throughout your body. When blood pressure is too low, you may not get enough blood to your brain or to the rest of your organs. This can cause weakness, light-headedness, rapid heartbeat, and fainting. This can last for just a few seconds or for up to a few minutes. Orthostatic hypotension is usually not a serious problem. However, if it happens frequently or gets worse, it may be a sign of something more serious. What are the causes? This condition may be caused by:  Sudden changes in posture, such as standing up quickly after you have been sitting or lying down.  Blood loss.  Loss of body fluids (dehydration).  Heart problems.  Hormone (endocrine) problems.  Pregnancy.  Severe infection.  Lack of certain nutrients.  Severe allergic reactions (anaphylaxis).  Certain medicines, such as blood pressure medicine or medicines that make the body lose excess fluids (diuretics). Sometimes, this condition can be caused by not taking medicine as directed, such as taking too much of a certain medicine. What increases the risk? The following factors may make you more likely to develop this condition:  Age. Risk increases as you get older.  Conditions that affect the heart or the central nervous system.  Taking certain medicines, such as blood pressure medicine or diuretics.  Being pregnant. What are the signs or symptoms? Symptoms of this condition may include:  Weakness.  Light-headedness.  Dizziness.  Blurred vision.  Fatigue.  Rapid heartbeat.  Fainting, in severe cases. How is this diagnosed? This condition is diagnosed based on:  Your medical history.  Your symptoms.  Your blood pressure measurement. Your health care provider will check your blood pressure when you are: ? Lying down. ? Sitting. ? Standing. A  blood pressure reading is recorded as two numbers, such as "120 over 80" (or 120/80). The first ("top") number is called the systolic pressure. It is a measure of the pressure in your arteries as your heart beats. The second ("bottom") number is called the diastolic pressure. It is a measure of the pressure in your arteries when your heart relaxes between beats. Blood pressure is measured in a unit called mm Hg. Healthy blood pressure for most adults is 120/80. If your blood pressure is below 90/60, you may be diagnosed with hypotension. Other information or tests that may be used to diagnose orthostatic hypotension include:  Your other vital signs, such as your heart rate and temperature.  Blood tests.  Tilt table test. For this test, you will be safely secured to a table that moves you from a lying position to an upright position. Your heart rhythm and blood pressure will be monitored during the test. How is this treated? This condition may be treated by:  Changing your diet. This may involve eating more salt (sodium) or drinking more water.  Taking medicines to raise your blood pressure.  Changing the dosage of certain medicines you are taking that might be lowering your blood pressure.  Wearing compression stockings. These stockings help to prevent blood clots and reduce swelling in your legs. In some cases, you may need to go to the hospital for:  Fluid replacement. This means you will receive fluids through an IV.  Blood replacement. This means you will receive donated blood through an IV (transfusion).  Treating an infection or heart problems, if this applies.  Monitoring. You may need to be monitored while medicines that you are taking wear off. Follow these instructions at home: Eating and drinking   Drink enough fluid to keep your urine pale yellow.  Eat a healthy diet, and follow instructions from your health care provider about eating or drinking restrictions. A healthy  diet includes: ? Fresh fruits and vegetables. ? Whole grains. ? Lean meats. ? Low-fat dairy products.  Eat extra salt only as directed. Do not add extra salt to your diet unless your health care provider told you to do that.  Eat frequent, small meals.  Avoid standing up suddenly after eating. Medicines  Take over-the-counter and prescription medicines only as told by your health care provider. ? Follow instructions from your health care provider about changing the dosage of your current medicines, if this applies. ? Do not stop or adjust any of your medicines on your own. General instructions   Wear compression stockings as told by your health care provider.  Get up slowly from lying down or sitting positions. This gives your blood pressure a chance to adjust.  Avoid hot showers and excessive heat as directed by your health care provider.  Return to your normal activities as told by your health care provider. Ask your health care provider what activities are safe for you.  Do not use any products that contain nicotine or tobacco, such as cigarettes, e-cigarettes, and chewing tobacco. If you need help quitting, ask your health care provider.  Keep all follow-up visits as told by your health care provider. This is important. Contact a  health care provider if you:  Vomit.  Have diarrhea.  Have a fever for more than 2-3 days.  Feel more thirsty than usual.  Feel weak and tired. Get help right away if you:  Have chest pain.  Have a fast or irregular heartbeat.  Develop numbness in any part of your body.  Cannot move your arms or your legs.  Have trouble speaking.  Become sweaty or feel light-headed.  Faint.  Feel short of breath.  Have trouble staying awake.  Feel confused. Summary  Orthostatic hypotension is a sudden drop in blood pressure that happens when you quickly change positions.  Orthostatic hypotension is usually not a serious problem.  It is  diagnosed by having your blood pressure taken lying down, sitting, and then standing.  It may be treated by changing your diet or adjusting your medicines. This information is not intended to replace advice given to you by your health care provider. Make sure you discuss any questions you have with your health care provider. Document Revised: 09/24/2017 Document Reviewed: 09/24/2017 Elsevier Patient Education  [place].

## 2019-10-12 ENCOUNTER — Other Ambulatory Visit: Payer: Self-pay | Admitting: Family Medicine

## 2019-10-31 DIAGNOSIS — N2581 Secondary hyperparathyroidism of renal origin: Secondary | ICD-10-CM | POA: Diagnosis not present

## 2019-10-31 DIAGNOSIS — R600 Localized edema: Secondary | ICD-10-CM | POA: Diagnosis not present

## 2019-10-31 DIAGNOSIS — I10 Essential (primary) hypertension: Secondary | ICD-10-CM | POA: Diagnosis not present

## 2019-10-31 DIAGNOSIS — R296 Repeated falls: Secondary | ICD-10-CM | POA: Diagnosis not present

## 2019-10-31 DIAGNOSIS — N1831 Chronic kidney disease, stage 3a: Secondary | ICD-10-CM | POA: Diagnosis not present

## 2019-10-31 DIAGNOSIS — D631 Anemia in chronic kidney disease: Secondary | ICD-10-CM | POA: Diagnosis not present

## 2019-11-03 ENCOUNTER — Telehealth: Payer: Self-pay | Admitting: Family Medicine

## 2019-11-03 DIAGNOSIS — E1142 Type 2 diabetes mellitus with diabetic polyneuropathy: Secondary | ICD-10-CM

## 2019-11-03 NOTE — Telephone Encounter (Signed)
Pt needs a refill on losartan (COZAAR) 25 MG tablet

## 2019-11-04 MED ORDER — LOSARTAN POTASSIUM 25 MG PO TABS: 25.0000 mg | ORAL_TABLET | Freq: Two times a day (BID) | ORAL | 0 refills | Status: DC

## 2019-11-04 NOTE — Addendum Note (Signed)
Addended byElpidio Galea T on: 11/04/2019 08:13 AM   Modules accepted: Orders

## 2019-11-07 DIAGNOSIS — Z23 Encounter for immunization: Secondary | ICD-10-CM | POA: Diagnosis not present

## 2019-11-08 ENCOUNTER — Ambulatory Visit: Payer: Medicare Other | Admitting: Urology

## 2019-11-09 ENCOUNTER — Ambulatory Visit: Payer: Medicare Other | Admitting: Urology

## 2019-11-10 ENCOUNTER — Other Ambulatory Visit: Payer: Self-pay | Admitting: Internal Medicine

## 2019-11-14 ENCOUNTER — Other Ambulatory Visit: Payer: Self-pay | Admitting: Family Medicine

## 2019-11-14 DIAGNOSIS — Z760 Encounter for issue of repeat prescription: Secondary | ICD-10-CM

## 2019-11-19 ENCOUNTER — Other Ambulatory Visit: Payer: Self-pay | Admitting: Family Medicine

## 2019-11-21 ENCOUNTER — Ambulatory Visit: Payer: Medicare Other | Attending: Nephrology | Admitting: Physical Therapy

## 2019-11-21 ENCOUNTER — Other Ambulatory Visit: Payer: Self-pay

## 2019-11-21 ENCOUNTER — Encounter: Payer: Self-pay | Admitting: Physical Therapy

## 2019-11-21 DIAGNOSIS — M6281 Muscle weakness (generalized): Secondary | ICD-10-CM | POA: Diagnosis not present

## 2019-11-21 DIAGNOSIS — R2689 Other abnormalities of gait and mobility: Secondary | ICD-10-CM | POA: Insufficient documentation

## 2019-11-21 NOTE — Therapy (Signed)
[facility] [address] [place], [place], [address] Phone: [phone]   Fax:  [phone]  Physical Therapy Evaluation  Patient Details  Name: Douglas [name] MRN: [id number] Date of Birth: Aug 23, 1945 Referring Provider (PT): Anthonette Legato, MD   Encounter Date: 11/21/2019   PT End of Session - 11/21/19 1417    Visit Number 1    Number of Visits 9    Date for PT Re-Evaluation 12/19/19    Authorization - Visit Number 1    Authorization - Number of Visits 10    PT Start Time 1300    PT Stop Time 3220    PT Time Calculation (min) 55 min    Equipment Utilized During Treatment Gait belt    Activity Tolerance Patient tolerated treatment well    Behavior During Therapy [facility] for tasks assessed/performed           Past Medical History:  Diagnosis Date  . Arthritis   . Depression   . Diabetes mellitus with complication ([place])   . Diabetic neuropathy ([place])   . Diastolic dysfunction    a. TTE 07/2017: EF 60-65%, mild concentric LVH, no RWMA, Gr1DD, trivial AI, mildly dilated LA, RVSF normal, PASP normal  . Edema    feet/legs  . GERD (gastroesophageal reflux disease)   . Gout   . History of stress test    a. MV 06/2017: small in size, mild in severity, apical anterior and apical defect that was minimally reversible and most likely represented artifact and less likely ischemia/scar, LVEF 55-65%, low risk, probably normal stress test  . Hypertension   . Hypothyroidism   . Kidney cysts    renal failure 2013  . Kidney stones    Dr. Rogers Blocker  . OSA (obstructive sleep apnea)    supplemental oxygen at night  . Oxygen dependent    hs  . Rocky Mountain spotted fever 12/10/2017   Positve IgG in titer on 11/20/17  . S/P cardiac cath 1998   a. no obstructive disease  . Syncope and collapse   . Temporary low platelet count (HCC)     Past Surgical History:  Procedure Laterality Date  . ANAL FISSURE REPAIR    . BACK SURGERY  1977    rupture disc lumbar spine  . [facility] with [facility] and Vascular.   Marland Kitchen CATARACT EXTRACTION W/PHACO Right 07/16/2016   Procedure: CATARACT EXTRACTION PHACO AND INTRAOCULAR LENS PLACEMENT (IOC);  Surgeon: [name], MD;  Location: [facility] ORS;  Service: Ophthalmology;  Laterality: Right;  [place] 01:11 AP% 17.6 CDE 24.83 fluid pack lot # [id number]  . CATARACT EXTRACTION W/PHACO Left 08/13/2016   Procedure: CATARACT EXTRACTION PHACO AND INTRAOCULAR LENS PLACEMENT ([place]) suture placed in left eye at end of procedure;  Surgeon: [name], MD;  Location: [facility] ORS;  Service: Ophthalmology;  Laterality: Left;  [place] 01:55 AP% 22.7 CDE 53.56 fluid pack lot # [id number]  . FINGER AMPUTATION     partial  . KNEE SURGERY  1993   arthroscopy  . SHOULDER SURGERY Bilateral 1998   arthroscopic right, rotator cuff repair left    There were no vitals filed for this visit.    Subjective Assessment - 11/21/19 1403    Subjective Pt. is a 74 year old male with reports of dizziness and imbalance. Pt. states that his dizziness is typically with decreased blood pressure. Pt. states that the dizziness is worse in the afternoon typically  around 2-4 pm. Pt. states he has diabetic neuropathy with pain and sensation changes in big toe up to mid shin. Pt. has had a few falls in the past 6 months mostly outdoors. Pt. states that yardwork for about 10 minutes before feeling onset of fatigue/dizziness/imbalance. Pt. states that balance/dizziness issues are worse in the heat. Pt. states that stairs are very difficult for him, and has 4 STE with handrails.    Pertinent History diabetic neuropathy    How long can you stand comfortably? 10 mins    Patient Stated Goals return to yardwork, walking    Currently in Pain? No/denies              [facility] PT Assessment - 11/21/19 0001      Assessment   Medical Diagnosis imbalance    Referring Provider (PT) Munsoor Holley Raring, MD     Onset Date/Surgical Date 04/15/19    Prior Therapy Not reported      Balance Screen   Has the patient fallen in the past 6 months Yes    How many times? 3+      [place] residence      Prior Function   Level of Independence Independent      Cognition   Overall Cognitive Status Within Functional Limits for tasks assessed           OBJECTIVE   Mental Status Patient is oriented to person, place and time.  Recent memory is intact.  Remote memory is intact.  Attention span and concentration are intact.  Expressive speech is intact.  Patient's fund of knowledge is within normal limits for educational level.   Sensation: Pt has no sensation on bilat big toes, medial arches, and distal half of shins.  Gait Pt demonstrates decreased step length bilat, decreased knee flex bilat, and excessive toeing out bilat  Strength Pt demonstrates 5/5 on bilat LE with the exception of hip flex. R: 4+/5, L 4-/5  Special Tests: Berg Balance: 47/56 FOTO: 38/53    Objective measurements completed on examination: See above findings.       PT will issue HEP next tx. session     PT Education - 11/21/19 1527    Education Details Pt instructed to complete SLS at countertop with hand support.    Person(s) Educated Patient    Methods Explanation;Demonstration    Comprehension Verbalized understanding               PT Long Term Goals - 11/21/19 1432      PT LONG TERM GOAL #1   Title Pt will increase FOTO score to 53 to display perceived improvements in functional mobility.    Baseline 8/9: 38    Time 4    Period Weeks    Status New    Target Date 12/19/19      PT LONG TERM GOAL #2   Title Pt. will report no dizziness/imbalance/fatigue after approx 10 mins of yardwork to improve ability to complete ADLs.    Baseline initial: pt feels dizziness/imbalance/fatigue after 10 mins of yardwork    Time 4    Period Weeks    Status New     Target Date 12/19/19      PT LONG TERM GOAL #3   Title Pt will improve Berg balance to 52/56 to decrease falls risk.    Baseline initial: 47/56    Time 4    Period Weeks    Status New  Target Date 12/19/19      PT LONG TERM GOAL #4   Title Pt will improve bilat hip flexion strength to 5/5 to to improve functional mobility.    Baseline initial: R hip flex: 4+/5, L hip flex: 4-/5    Time 4    Period Weeks    Status New    Target Date 12/19/19                  Plan - 11/21/19 1517    Clinical Impression Statement Pt. is a 74 y.o male with decreased balance, impaired sensation in bilat feet, and dizziness. Pt. states that the dizziness seems to be related to low blood pressure, and it feels like he's "rocking on a boat." Pt. has impaired/nonexistent sensation on bilat big toes, medial arches, and on distal half of shins. Pt. demonstrated WFL strength for bilat LEs with the exception of hip flex, with R hip flex at 4+/5, L hip flex at 4-/5. During ambulation, pt demonstrates decreased step length bilat, decreased knee flex bilat, and excessive toeing out bilat. Pt. does not ambulate with any AD at this time, though states he has a walking stick at home that he uses when the dizziness gets bad. Pt. states that the dizziness can get up to 10/10 where he has to sit down to recover. Pt. additionally states that he occasionally sees black dots in vision before/while feeling dizziness. Pt. scored a 38/53 on FOTO. Pt. scored 47/56 on Berg Balance scale. Pt. will benefit from skilled PT to improve balance and gait to improve ability to complete ADLs and to decrease falls risk.    Stability/Clinical Decision Making Evolving/Moderate complexity    Clinical Decision Making Moderate    Rehab Potential Good    PT Frequency 2x / week    PT Duration 4 weeks    PT Treatment/Interventions ADLs/Self Care Home Management;Electrical Stimulation;Gait training;Stair training;Functional mobility  training;Therapeutic activities;Therapeutic exercise;Balance training;Neuromuscular re-education;Patient/family education    PT Next Visit Plan star balance, single leg balance, dynamic balance    PT Home Exercise Plan see HEP    Consulted and Agree with Plan of Care Patient           Patient will benefit from skilled therapeutic intervention in order to improve the following deficits and impairments:  Abnormal gait, Decreased activity tolerance, Decreased balance, Decreased strength, Impaired sensation, Postural dysfunction, Difficulty walking, Dizziness, Decreased endurance  Visit Diagnosis: Imbalance  Muscle weakness (generalized)  Other abnormalities of gait and mobility     Problem List Patient Active Problem List   Diagnosis Date Noted  . Viral gastroenteritis 08/22/2019  . Acute kidney failure ([place]) 02/18/2019  . Anemia in chronic kidney disease 02/18/2019  . Secondary hyperparathyroidism of renal origin ([place]) 02/18/2019  . Right kidney mass 01/20/2019  . Erectile dysfunction due to arterial insufficiency 11/09/2018  . Respiratory illness 11/05/2018  . Tick bite of knee, initial encounter 08/17/2018  . Left shoulder pain 07/02/2018  . Allergic rhinitis 07/02/2018  . Cervical radiculopathy 11/20/2017  . Toe pain, bilateral 11/20/2017  . Abdominal pain 02/18/2017  . Clavicle enlargement 02/18/2017  . Anxiety 02/18/2017  . Degenerative arthritis of knee, bilateral 09/04/2016  . BPH (benign prostatic hyperplasia) 08/18/2016  . Knee osteoarthritis 08/18/2016  . Hyperlipidemia 05/20/2016  . Essential hypertension 05/31/2015  . Low testosterone 02/27/2015  . Diabetic neuropathy ([place]) 01/05/2013  . Obesity (BMI 30-39.9) 01/05/2013  . Chronic back pain greater than 3 months duration 01/05/2013  . Gout 01/05/2013  .  OSA (obstructive sleep apnea) 01/05/2013  . Chronic kidney disease 01/04/2013  . DM type 2 with diabetic peripheral neuropathy ([place]) 01/04/2013  .  Hypothyroidism 01/04/2013   Pura Spice, PT, DPT # [id number], SPT 11/21/2019, 4:22 PM  [place] [facility] [address] [place], [place], [address] Phone: [phone]   Fax:  [phone]  Name: Douglas [name] MRN: [id number] Date of Birth: 07-Jun-1945

## 2019-11-23 ENCOUNTER — Other Ambulatory Visit: Payer: Self-pay

## 2019-11-23 ENCOUNTER — Encounter: Payer: Self-pay | Admitting: Family Medicine

## 2019-11-23 ENCOUNTER — Ambulatory Visit (INDEPENDENT_AMBULATORY_CARE_PROVIDER_SITE_OTHER): Payer: Medicare Other | Admitting: Family Medicine

## 2019-11-23 DIAGNOSIS — E1142 Type 2 diabetes mellitus with diabetic polyneuropathy: Secondary | ICD-10-CM

## 2019-11-23 DIAGNOSIS — E785 Hyperlipidemia, unspecified: Secondary | ICD-10-CM | POA: Diagnosis not present

## 2019-11-23 DIAGNOSIS — R7989 Other specified abnormal findings of blood chemistry: Secondary | ICD-10-CM

## 2019-11-23 DIAGNOSIS — I10 Essential (primary) hypertension: Secondary | ICD-10-CM

## 2019-11-23 LAB — HEPATIC FUNCTION PANEL
ALT: 24 U/L (ref 0–53)
AST: 26 U/L (ref 0–37)
Albumin: 4.1 g/dL (ref 3.5–5.2)
Alkaline Phosphatase: 69 U/L (ref 39–117)
Bilirubin, Direct: 0.2 mg/dL (ref 0.0–0.3)
Total Bilirubin: 0.7 mg/dL (ref 0.2–1.2)
Total Protein: 6.5 g/dL (ref 6.0–8.3)

## 2019-11-23 LAB — LIPID PANEL
Cholesterol: 115 mg/dL (ref 0–200)
HDL: 40.8 mg/dL (ref >39.00)
LDL Cholesterol: 40 mg/dL (ref 0–99)
NonHDL: 74.51
Total CHOL/HDL Ratio: 3
Triglycerides: 174 mg/dL — ABNORMAL HIGH (ref 0.0–149.0)
VLDL: 34.8 mg/dL (ref 0.0–40.0)

## 2019-11-23 LAB — TESTOSTERONE: Testosterone: 199.59 ng/dL — ABNORMAL LOW (ref 300.00–890.00)

## 2019-11-23 LAB — HEMOGLOBIN A1C: Hgb A1c MFr Bld: 7.8 % — ABNORMAL HIGH (ref 4.6–6.5)

## 2019-11-23 NOTE — Assessment & Plan Note (Signed)
Overall adequately controlled.  UDS to be obtained today.

## 2019-11-23 NOTE — Patient Instructions (Signed)
Nice to see you. We will check lab work today and contact you with the results. Please continue to monitor your blood pressure and lightheadedness.  If your blood pressure stays elevated please let us know.  If lightheadedness worsens please let us know.

## 2019-11-23 NOTE — Assessment & Plan Note (Signed)
Check A1c.  Continue current regimen. 

## 2019-11-23 NOTE — Progress Notes (Signed)
  [name], MD Phone: [phone]  Douglas [name] is a 74 y.o. male who presents today for f/u.  HYPERTENSION  Disease Monitoring  Home BP Monitoring 120-173/57-93, though also has frequent borderline lows with associated light headedness Chest pain- no    Dyspnea- chronic and stable Medications  Compliance-  Taking losartan, lasix, cardura. Lightheadedness-  yes  Edema- chronic and stable  DIABETES Disease Monitoring: Blood Sugar ranges-105-160s, excursions to 200 Polyuria/phagia/dipsia- polydipsia      Optho- due Medications: Compliance- taking lantus, jardiance, metformin Hypoglycemic symptoms- no  Hypogonadism: History of this in the past.  He was previously seeing urology and tried hormone supplement as well as Clomid with no significant benefit.  He wonders about rechecking his levels.  Neuropathic pain: Patient notes this is fairly well controlled currently with Salonpas and clove oil.  He has not required a pain pill since the beginning of the year.     Social History   Tobacco Use  Smoking Status Never Smoker  Smokeless Tobacco Never Used     ROS see history of present illness  Objective  Physical Exam Vitals:   11/23/19 1325  BP: 140/80  Pulse: 91  Temp: 98.9 F (37.2 C)  SpO2: 96%    BP Readings from Last 3 Encounters:  11/23/19 140/80  09/07/19 140/80  09/05/19 138/78   Wt Readings from Last 3 Encounters:  11/23/19 264 lb 12.8 oz (120.1 kg)  09/07/19 266 lb 12.8 oz (121 kg)  09/05/19 265 lb (120.2 kg)    Physical Exam Constitutional:      General: He is not in acute distress.    Appearance: He is not diaphoretic.  Cardiovascular:     Rate and Rhythm: Normal rate and regular rhythm.     Heart sounds: Normal heart sounds.  Pulmonary:     Effort: Pulmonary effort is normal.     Breath sounds: Normal breath sounds.  Musculoskeletal:     Right lower leg: No edema.     Left lower leg: No edema.  Skin:    General: Skin is warm  and dry.  Neurological:     Mental Status: He is alert.      Assessment/Plan: Please see individual problem list.  Essential hypertension Blood pressure is variable.  He has frequent lows with complaints of lightheadedness and borderline low BPs every other day.  This makes increasing any of his medicines difficult to get his elevated BPs under better control.  At this time he will continue with his current regimen and monitor his lightheadedness.  Diabetic neuropathy ([place]) Overall adequately controlled.  UDS to be obtained today.  DM type 2 with diabetic peripheral neuropathy (HCC) Check A1c.  Continue current regimen.  Low testosterone Check testosterone level.  Hyperlipidemia Check lipid panel and hepatic function panel.  Continue simvastatin.   Orders Placed This Encounter  Procedures  . DRUG MONITORING, PANEL 8 WITH CONFIRMATION, URINE  . Hepatic function panel  . HgB A1c  . Lipid panel  . Testosterone    No orders of the defined types were placed in this encounter.   This visit occurred during the SARS-CoV-2 public health emergency.  Safety protocols were in place, including screening questions prior to the visit, additional usage of staff PPE, and extensive cleaning of exam room while observing appropriate contact time as indicated for disinfecting solutions.    [name], MD [place]

## 2019-11-23 NOTE — Assessment & Plan Note (Signed)
Check testosterone level.  

## 2019-11-23 NOTE — Assessment & Plan Note (Signed)
Blood pressure is variable.  He has frequent lows with complaints of lightheadedness and borderline low BPs every other day.  This makes increasing any of his medicines difficult to get his elevated BPs under better control.  At this time he will continue with his current regimen and monitor his lightheadedness.

## 2019-11-23 NOTE — Assessment & Plan Note (Signed)
Check lipid panel and hepatic function panel.  Continue simvastatin.

## 2019-11-24 LAB — DRUG MONITORING, PANEL 8 WITH CONFIRMATION, URINE
6 Acetylmorphine: NEGATIVE ng/mL (ref <10)
Alcohol Metabolites: NEGATIVE ng/mL
Amphetamines: NEGATIVE ng/mL (ref <500)
Benzodiazepines: NEGATIVE ng/mL (ref <100)
Buprenorphine, Urine: NEGATIVE ng/mL (ref <5)
Cocaine Metabolite: NEGATIVE ng/mL (ref <150)
Creatinine: 39.7 mg/dL
MDMA: NEGATIVE ng/mL (ref <500)
Marijuana Metabolite: NEGATIVE ng/mL (ref <20)
Opiates: NEGATIVE ng/mL (ref <100)
Oxidant: NEGATIVE ug/mL
Oxycodone: NEGATIVE ng/mL (ref <100)
pH: 5.3 (ref 4.5–9.0)

## 2019-11-24 LAB — DM TEMPLATE

## 2019-11-28 ENCOUNTER — Other Ambulatory Visit: Payer: Self-pay | Admitting: Family Medicine

## 2019-11-28 ENCOUNTER — Ambulatory Visit: Payer: Medicare Other | Admitting: Physical Therapy

## 2019-11-28 DIAGNOSIS — E291 Testicular hypofunction: Secondary | ICD-10-CM

## 2019-11-29 ENCOUNTER — Encounter: Payer: Self-pay | Admitting: Physical Therapy

## 2019-11-29 ENCOUNTER — Other Ambulatory Visit: Payer: Self-pay

## 2019-11-29 ENCOUNTER — Ambulatory Visit: Payer: Medicare Other | Admitting: Physical Therapy

## 2019-11-29 DIAGNOSIS — M6281 Muscle weakness (generalized): Secondary | ICD-10-CM

## 2019-11-29 DIAGNOSIS — R2689 Other abnormalities of gait and mobility: Secondary | ICD-10-CM

## 2019-11-29 NOTE — Therapy (Signed)
[place] [address] [place], [place], [address] Phone: [phone]   Fax:  [phone]  Physical Therapy Treatment  Patient Details  Name: Douglas [name] MRN: [id number] Date of Birth: 10/28/1945 Referring Provider (PT): Anthonette Legato, MD   Encounter Date: 11/29/2019   PT End of Session - 11/29/19 1622    Visit Number 2    Number of Visits 9    Date for PT Re-Evaluation 12/19/19    Authorization - Visit Number 2    Authorization - Number of Visits 10    PT Start Time 1696    PT Stop Time 1209    PT Time Calculation (min) 45 min    Equipment Utilized During Treatment Gait belt    Activity Tolerance Patient tolerated treatment well    Behavior During Therapy [facility] for tasks assessed/performed           Past Medical History:  Diagnosis Date  . Arthritis   . Depression   . Diabetes mellitus with complication ([place])   . Diabetic neuropathy ([place])   . Diastolic dysfunction    a. TTE 07/2017: EF 60-65%, mild concentric LVH, no RWMA, Gr1DD, trivial AI, mildly dilated LA, RVSF normal, PASP normal  . Edema    feet/legs  . GERD (gastroesophageal reflux disease)   . Gout   . History of stress test    a. MV 06/2017: small in size, mild in severity, apical anterior and apical defect that was minimally reversible and most likely represented artifact and less likely ischemia/scar, LVEF 55-65%, low risk, probably normal stress test  . Hypertension   . Hypothyroidism   . Kidney cysts    renal failure 2013  . Kidney stones    Dr. Rogers Blocker  . OSA (obstructive sleep apnea)    supplemental oxygen at night  . Oxygen dependent    hs  . Rocky Mountain spotted fever 12/10/2017   Positve IgG in titer on 11/20/17  . S/P cardiac cath 1998   a. no obstructive disease  . Syncope and collapse   . Temporary low platelet count (HCC)     Past Surgical History:  Procedure Laterality Date  . ANAL FISSURE REPAIR    . BACK SURGERY  1977    rupture disc lumbar spine  . [facility] with [facility] and Vascular.   Marland Kitchen CATARACT EXTRACTION W/PHACO Right 07/16/2016   Procedure: CATARACT EXTRACTION PHACO AND INTRAOCULAR LENS PLACEMENT (IOC);  Surgeon: [name], MD;  Location: [facility] ORS;  Service: Ophthalmology;  Laterality: Right;  [place] 01:11 AP% 17.6 CDE 24.83 fluid pack lot # [id number]  . CATARACT EXTRACTION W/PHACO Left 08/13/2016   Procedure: CATARACT EXTRACTION PHACO AND INTRAOCULAR LENS PLACEMENT ([place]) suture placed in left eye at end of procedure;  Surgeon: [name], MD;  Location: [facility] ORS;  Service: Ophthalmology;  Laterality: Left;  [place] 01:55 AP% 22.7 CDE 53.56 fluid pack lot # [id number]  . FINGER AMPUTATION     partial  . KNEE SURGERY  1993   arthroscopy  . SHOULDER SURGERY Bilateral 1998   arthroscopic right, rotator cuff repair left    There were no vitals filed for this visit.   Subjective Assessment - 11/29/19 1620    Subjective Pt reports compliance with HEP. No falls or stumbles reported. Pt states he has most difficulty with stairs still.    Pertinent History diabetic neuropathy    How long can you stand comfortably? 10  mins    Patient Stated Goals return to yardwork, walking    Currently in Pain? No/denies    Multiple Pain Sites No           Neuro Re-Ed:   B SLS at // bars: 2x5, attempting to hold for 10 sec with no UE support.   B 6" toe taps: 2x12, difficulty with L hip flexion onto step requiring intermittent, multiple steps to complete tap.  In hallway: 64'x2 vertical head turns. Intermittent R lat sway but able to correct on his own. Reports of minor dizziness.   In hallway: 64'x2 horizontal head turns. No lat sway.  There.ex:   STS: 1x20, no UE's on handrails   Seated green TB L foot PF: 1x20   Seated green TB L DF: 1x20   Seated green TB Inversion: 1x20   Seated green TB eversion: 1x20   Reviewed HEP    PT Long Term Goals - 11/21/19  1432      PT LONG TERM GOAL #1   Title Pt will increase FOTO score to 53 to display perceived improvements in functional mobility.    Baseline 8/9: 38    Time 4    Period Weeks    Status New    Target Date 12/19/19      PT LONG TERM GOAL #2   Title Pt. will report no dizziness/imbalance/fatigue after approx 10 mins of yardwork to improve ability to complete ADLs.    Baseline initial: pt feels dizziness/imbalance/fatigue after 10 mins of yardwork    Time 4    Period Weeks    Status New    Target Date 12/19/19      PT LONG TERM GOAL #3   Title Pt will improve Berg balance to 52/56 to decrease falls risk.    Baseline initial: 47/56    Time 4    Period Weeks    Status New    Target Date 12/19/19      PT LONG TERM GOAL #4   Title Pt will improve bilat hip flexion strength to 5/5 to to improve functional mobility.    Baseline initial: R hip flex: 4+/5, L hip flex: 4-/5    Time 4    Period Weeks    Status New    Target Date 12/19/19                 Plan - 11/29/19 1623    Clinical Impression Statement Pt able to stand on BLE's with single leg stance with 10 sec without using his hands for support on // bars. Pt had difficulty performing 6" toe taps with L hip flexion requiring multiple attempts to perform and intermittent HHA on hand rails. Pt had reports of fatigue in ankles and hips with balance tasks. Pt can benefit from skilled PT to improve ankle/hip strategies to reduce risk of falls and to improve BLE strength.    Stability/Clinical Decision Making Evolving/Moderate complexity    Rehab Potential Good    PT Frequency 2x / week    PT Duration 4 weeks    PT Treatment/Interventions ADLs/Self Care Home Management;Electrical Stimulation;Gait training;Stair training;Functional mobility training;Therapeutic activities;Therapeutic exercise;Balance training;Neuromuscular re-education;Patient/family education    PT Next Visit Plan star balance, single leg balance, dynamic  balance    PT Home Exercise Plan see HEP    Consulted and Agree with Plan of Care Patient           Patient will benefit from skilled therapeutic intervention in order to  improve the following deficits and impairments:  Abnormal gait, Decreased activity tolerance, Decreased balance, Decreased strength, Impaired sensation, Postural dysfunction, Difficulty walking, Dizziness, Decreased endurance  Visit Diagnosis: Imbalance  Muscle weakness (generalized)  Other abnormalities of gait and mobility     Problem List Patient Active Problem List   Diagnosis Date Noted  . Viral gastroenteritis 08/22/2019  . Acute kidney failure ([place]) 02/18/2019  . Anemia in chronic kidney disease 02/18/2019  . Secondary hyperparathyroidism of renal origin ([place]) 02/18/2019  . Right kidney mass 01/20/2019  . Erectile dysfunction due to arterial insufficiency 11/09/2018  . Respiratory illness 11/05/2018  . Tick bite of knee, initial encounter 08/17/2018  . Left shoulder pain 07/02/2018  . Allergic rhinitis 07/02/2018  . Cervical radiculopathy 11/20/2017  . Toe pain, bilateral 11/20/2017  . Abdominal pain 02/18/2017  . Clavicle enlargement 02/18/2017  . Anxiety 02/18/2017  . Degenerative arthritis of knee, bilateral 09/04/2016  . BPH (benign prostatic hyperplasia) 08/18/2016  . Knee osteoarthritis 08/18/2016  . Hyperlipidemia 05/20/2016  . Essential hypertension 05/31/2015  . Low testosterone 02/27/2015  . Diabetic neuropathy ([place]) 01/05/2013  . Obesity (BMI 30-39.9) 01/05/2013  . Chronic back pain greater than 3 months duration 01/05/2013  . Gout 01/05/2013  . OSA (obstructive sleep apnea) 01/05/2013  . Chronic kidney disease 01/04/2013  . DM type 2 with diabetic peripheral neuropathy ([place]) 01/04/2013  . Hypothyroidism 01/04/2013   Pura Spice, PT, DPT # [id number], SPT 11/29/2019, 6:19 PM  [place] [facility] [address]  [place], [place], [address] Phone: [phone]   Fax:  [phone]  Name: Douglas [name] MRN: [id number] Date of Birth: Mar 18, 1946

## 2019-11-30 ENCOUNTER — Ambulatory Visit: Payer: Medicare Other | Admitting: Physical Therapy

## 2019-11-30 ENCOUNTER — Other Ambulatory Visit: Payer: Self-pay | Admitting: Family Medicine

## 2019-11-30 ENCOUNTER — Telehealth: Payer: Self-pay

## 2019-11-30 DIAGNOSIS — E1142 Type 2 diabetes mellitus with diabetic polyneuropathy: Secondary | ICD-10-CM

## 2019-11-30 MED ORDER — OZEMPIC (0.25 OR 0.5 MG/DOSE) 2 MG/1.5ML ~~LOC~~ SOPN: PEN_INJECTOR | SUBCUTANEOUS | 1 refills | Status: AC

## 2019-11-30 NOTE — Telephone Encounter (Signed)
-----   Message from [name], MD sent at 11/28/2019  6:21 PM EDT ----- Noted. Endocrinology referral placed. Please make sure he does not have a personal or family history of thyroid cancer, adrenal cancer, or parathyroid cancer. If he does not I will send in the ozempic.

## 2019-12-01 ENCOUNTER — Other Ambulatory Visit: Payer: Self-pay

## 2019-12-01 ENCOUNTER — Ambulatory Visit: Payer: Medicare Other | Admitting: Physical Therapy

## 2019-12-01 ENCOUNTER — Encounter: Payer: Self-pay | Admitting: Physical Therapy

## 2019-12-01 DIAGNOSIS — R2689 Other abnormalities of gait and mobility: Secondary | ICD-10-CM | POA: Diagnosis not present

## 2019-12-01 DIAGNOSIS — M6281 Muscle weakness (generalized): Secondary | ICD-10-CM

## 2019-12-01 NOTE — Therapy (Signed)
[facility] [address] [place], [place], [address] Phone: [phone]   Fax:  [phone]  Physical Therapy Treatment  Patient Details  Name: Douglas [name] MRN: [id number] Date of Birth: 06/08/1945 Referring Provider (PT): Anthonette Legato, MD   Encounter Date: 12/01/2019   PT End of Session - 12/01/19 1740    Visit Number 3    Number of Visits 9    Date for PT Re-Evaluation 12/19/19    Authorization - Visit Number 3    Authorization - Number of Visits 10    PT Start Time 1346    PT Stop Time 1434    PT Time Calculation (min) 48 min    Equipment Utilized During Treatment Gait belt    Activity Tolerance Patient tolerated treatment well    Behavior During Therapy [facility] for tasks assessed/performed           Past Medical History:  Diagnosis Date  . Arthritis   . Depression   . Diabetes mellitus with complication ([place])   . Diabetic neuropathy ([place])   . Diastolic dysfunction    a. TTE 07/2017: EF 60-65%, mild concentric LVH, no RWMA, Gr1DD, trivial AI, mildly dilated LA, RVSF normal, PASP normal  . Edema    feet/legs  . GERD (gastroesophageal reflux disease)   . Gout   . History of stress test    a. MV 06/2017: small in size, mild in severity, apical anterior and apical defect that was minimally reversible and most likely represented artifact and less likely ischemia/scar, LVEF 55-65%, low risk, probably normal stress test  . Hypertension   . Hypothyroidism   . Kidney cysts    renal failure 2013  . Kidney stones    Dr. Rogers Blocker  . OSA (obstructive sleep apnea)    supplemental oxygen at night  . Oxygen dependent    hs  . Rocky Mountain spotted fever 12/10/2017   Positve IgG in titer on 11/20/17  . S/P cardiac cath 1998   a. no obstructive disease  . Syncope and collapse   . Temporary low platelet count (HCC)     Past Surgical History:  Procedure Laterality Date  . ANAL FISSURE REPAIR    . BACK SURGERY  1977    rupture disc lumbar spine  . [facility].   Marland Kitchen CATARACT EXTRACTION W/PHACO Right 07/16/2016   Procedure: CATARACT EXTRACTION PHACO AND INTRAOCULAR LENS PLACEMENT (IOC);  Surgeon: [name], MD;  Location: [facility] ORS;  Service: Ophthalmology;  Laterality: Right;  [place] 01:11 AP% 17.6 CDE 24.83 fluid pack lot # [id number]  . CATARACT EXTRACTION W/PHACO Left 08/13/2016   Procedure: CATARACT EXTRACTION PHACO AND INTRAOCULAR LENS PLACEMENT ([place]) suture placed in left eye at end of procedure;  Surgeon: [name], MD;  Location: [facility] ORS;  Service: Ophthalmology;  Laterality: Left;  [place] 01:55 AP% 22.7 CDE 53.56 fluid pack lot # [id number]  . FINGER AMPUTATION     partial  . KNEE SURGERY  1993   arthroscopy  . SHOULDER SURGERY Bilateral 1998   arthroscopic right, rotator cuff repair left    There were no vitals filed for this visit.   Subjective Assessment - 12/01/19 1739    Subjective Pt reports soreness in B hips and knees from previous session. Pt states he has B pain in plantar surface of feet at 8/10 NPS from neuropathy.    Pertinent History diabetic neuropathy  How long can you stand comfortably? 10 mins    Patient Stated Goals return to yardwork, walking    Currently in Pain? Yes    Pain Score 8     Pain Location Foot    Pain Orientation Right;Left    Pain Descriptors / Indicators Burning    Pain Type Neuropathic pain          There.ex:  Nu Step L4 for 10 min with use of UE's/LE's  Seated PF/DF/Inv/Ev resisted green TB: 2x12  Neuro Re-Ed:  Step to pattern over 3 small hurdles: 2x10, no difficulty. Mild dizziness with turning around  Reciprocal pattern stepping over 3 small hurdles: 2x10. Intermittent lat sway and reliance on // bar. Obstacle course: walking over blue mat --> step over 6" step, stepping over 1/2 bolster to small bolster to large bolster, cone weaves in S shape. x3   Progressed to  cone weaves with taps x2.     PT Long Term Goals - 11/21/19 1432      PT LONG TERM GOAL #1   Title Pt will increase FOTO score to 53 to display perceived improvements in functional mobility.    Baseline 8/9: 38    Time 4    Period Weeks    Status New    Target Date 12/19/19      PT LONG TERM GOAL #2   Title Pt. will report no dizziness/imbalance/fatigue after approx 10 mins of yardwork to improve ability to complete ADLs.    Baseline initial: pt feels dizziness/imbalance/fatigue after 10 mins of yardwork    Time 4    Period Weeks    Status New    Target Date 12/19/19      PT LONG TERM GOAL #3   Title Pt will improve Berg balance to 52/56 to decrease falls risk.    Baseline initial: 47/56    Time 4    Period Weeks    Status New    Target Date 12/19/19      PT LONG TERM GOAL #4   Title Pt will improve bilat hip flexion strength to 5/5 to to improve functional mobility.    Baseline initial: R hip flex: 4+/5, L hip flex: 4-/5    Time 4    Period Weeks    Status New    Target Date 12/19/19                 Plan - 12/01/19 1741    Clinical Impression Statement Pt demonstrated ability to complete obstacle course safely ambulating over unstable surface, stepping over 6" step with difficulty, and difficulty with cone taps. Pt fatigues quickly with standing exercise after 3-5 reps and requires seated rest breaks for ~2 min to recover. Pt can continue to benefit from skilled PT to improve balance, and muscular strength/endurance to improve functional mobility.    Stability/Clinical Decision Making Evolving/Moderate complexity    Rehab Potential Good    PT Frequency 2x / week    PT Duration 4 weeks    PT Treatment/Interventions ADLs/Self Care Home Management;Electrical Stimulation;Gait training;Stair training;Functional mobility training;Therapeutic activities;Therapeutic exercise;Balance training;Neuromuscular re-education;Patient/family education    PT Next Visit Plan BLE  strength, SLS    PT Home Exercise Plan see HEP    Consulted and Agree with Plan of Care Patient           Patient will benefit from skilled therapeutic intervention in order to improve the following deficits and impairments:  Abnormal gait, Decreased activity tolerance, Decreased balance,  Decreased strength, Impaired sensation, Postural dysfunction, Difficulty walking, Dizziness, Decreased endurance  Visit Diagnosis: Imbalance  Muscle weakness (generalized)  Other abnormalities of gait and mobility     Problem List Patient Active Problem List   Diagnosis Date Noted  . Viral gastroenteritis 08/22/2019  . Acute kidney failure ([place]) 02/18/2019  . Anemia in chronic kidney disease 02/18/2019  . Secondary hyperparathyroidism of renal origin ([place]) 02/18/2019  . Right kidney mass 01/20/2019  . Erectile dysfunction due to arterial insufficiency 11/09/2018  . Respiratory illness 11/05/2018  . Tick bite of knee, initial encounter 08/17/2018  . Left shoulder pain 07/02/2018  . Allergic rhinitis 07/02/2018  . Cervical radiculopathy 11/20/2017  . Toe pain, bilateral 11/20/2017  . Abdominal pain 02/18/2017  . Clavicle enlargement 02/18/2017  . Anxiety 02/18/2017  . Degenerative arthritis of knee, bilateral 09/04/2016  . BPH (benign prostatic hyperplasia) 08/18/2016  . Knee osteoarthritis 08/18/2016  . Hyperlipidemia 05/20/2016  . Essential hypertension 05/31/2015  . Low testosterone 02/27/2015  . Diabetic neuropathy ([place]) 01/05/2013  . Obesity (BMI 30-39.9) 01/05/2013  . Chronic back pain greater than 3 months duration 01/05/2013  . Gout 01/05/2013  . OSA (obstructive sleep apnea) 01/05/2013  . Chronic kidney disease 01/04/2013  . DM type 2 with diabetic peripheral neuropathy ([place]) 01/04/2013  . Hypothyroidism 01/04/2013   Pura Spice, PT, DPT # [id number] Larna Daughters, SPT 12/02/2019, 2:20 PM  [place] [facility] [address] [place], [place], [address] Phone: [phone]   Fax:  [phone]  Name: Douglas [name] MRN: [id number] Date of Birth: December 09, 1945

## 2019-12-02 NOTE — Telephone Encounter (Signed)
Pt states that he can not take Ozempic because it cost $400. He also states that he has not heard anything about his testosterone test.

## 2019-12-02 NOTE — Telephone Encounter (Signed)
I called and spoke with the patient and informed him that the endo will reach out to him in 1-2 weeks to schedule, he wants you to know he cannot afford the Jardiance.  Makalah Asberry,cma

## 2019-12-02 NOTE — Telephone Encounter (Signed)
It looks like Douglas [name] relayed the result of his testosterone test when he spoke with him earlier this week.  His testosterone was low.  I placed a referral to endocrinology for him.  It could take 1 to 2 weeks for them to contact him.

## 2019-12-05 ENCOUNTER — Encounter: Payer: Self-pay | Admitting: Physical Therapy

## 2019-12-05 ENCOUNTER — Ambulatory Visit: Payer: Medicare Other | Admitting: Physical Therapy

## 2019-12-05 ENCOUNTER — Other Ambulatory Visit: Payer: Self-pay

## 2019-12-05 DIAGNOSIS — M6281 Muscle weakness (generalized): Secondary | ICD-10-CM | POA: Diagnosis not present

## 2019-12-05 DIAGNOSIS — R2689 Other abnormalities of gait and mobility: Secondary | ICD-10-CM

## 2019-12-05 NOTE — Therapy (Signed)
[facility] [address] [place], [place], [address] Phone: [phone]   Fax:  [phone]  Physical Therapy Treatment  Patient Details  Name: Douglas [name] MRN: [id number] Date of Birth: 12/16/1945 Referring Provider (PT): Anthonette Legato, MD   Encounter Date: 12/05/2019   PT End of Session - 12/05/19 1304    Visit Number 4    Number of Visits 9    Date for PT Re-Evaluation 12/19/19    Authorization - Visit Number 4    Authorization - Number of Visits 10    PT Start Time 9233    PT Stop Time 1348    PT Time Calculation (min) 46 min    Equipment Utilized During Treatment Gait belt    Activity Tolerance Patient tolerated treatment well    Behavior During Therapy [facility] for tasks assessed/performed           Past Medical History:  Diagnosis Date  . Arthritis   . Depression   . Diabetes mellitus with complication ([place])   . Diabetic neuropathy ([place])   . Diastolic dysfunction    a. TTE 07/2017: EF 60-65%, mild concentric LVH, no RWMA, Gr1DD, trivial AI, mildly dilated LA, RVSF normal, PASP normal  . Edema    feet/legs  . GERD (gastroesophageal reflux disease)   . Gout   . History of stress test    a. MV 06/2017: small in size, mild in severity, apical anterior and apical defect that was minimally reversible and most likely represented artifact and less likely ischemia/scar, LVEF 55-65%, low risk, probably normal stress test  . Hypertension   . Hypothyroidism   . Kidney cysts    renal failure 2013  . Kidney stones    Dr. Rogers Blocker  . OSA (obstructive sleep apnea)    supplemental oxygen at night  . Oxygen dependent    hs  . Rocky Mountain spotted fever 12/10/2017   Positve IgG in titer on 11/20/17  . S/P cardiac cath 1998   a. no obstructive disease  . Syncope and collapse   . Temporary low platelet count (HCC)     Past Surgical History:  Procedure Laterality Date  . ANAL FISSURE REPAIR    . BACK SURGERY  1977    rupture disc lumbar spine  . [facility].   Marland Kitchen CATARACT EXTRACTION W/PHACO Right 07/16/2016   Procedure: CATARACT EXTRACTION PHACO AND INTRAOCULAR LENS PLACEMENT (IOC);  Surgeon: [name], MD;  Location: [facility] ORS;  Service: Ophthalmology;  Laterality: Right;  [place] 01:11 AP% 17.6 CDE 24.83 fluid pack lot # [id number]  . CATARACT EXTRACTION W/PHACO Left 08/13/2016   Procedure: CATARACT EXTRACTION PHACO AND INTRAOCULAR LENS PLACEMENT ([place]) suture placed in left eye at end of procedure;  Surgeon: [name], MD;  Location: [facility] ORS;  Service: Ophthalmology;  Laterality: Left;  [place] 01:55 AP% 22.7 CDE 53.56 fluid pack lot # [id number]  . FINGER AMPUTATION     partial  . KNEE SURGERY  1993   arthroscopy  . SHOULDER SURGERY Bilateral 1998   arthroscopic right, rotator cuff repair left    There were no vitals filed for this visit.   Subjective Assessment - 12/05/19 1305    Subjective Pt. states that he felt sore in bilat back, glutes, thighs, and arms after last session. No pain in feet, no falls over weekend. Pt reports improvement in polyneuropathy pain in B great toes since beginning  PT.    Pertinent History diabetic neuropathy    How long can you stand comfortably? 10 mins    Patient Stated Goals return to yardwork, walking    Currently in Pain? No/denies           There Ex:  Nustep L4 10 mins  Neuro re-ed:  Resisted walking in // bars with x2 BTB backwards x5. Pt reports not a challenge for him.   Resisted forwards, backwards, lateral walking R and L against 60 lbs single arm nautilus: 5x each direction. Pt cued for equal steps and to control backwards walking. Pt cued for smaller steps when walking backwards to improve balance. During lateral walking, pt cued for staying in straight line when walking. Pt required CGA throughout.  Side stepping on airex balance beam in // bars: 2x8. Decreased  proprioception with keeping B feet on airex beam. Use of mirror as visual cue to improve foot placement due to poor sensation and proprioception.   Standing ball tosses on airex pad in multiple planes and outside BOS. 2 min.    PT Long Term Goals - 11/21/19 1432      PT LONG TERM GOAL #1   Title Pt will increase FOTO score to 53 to display perceived improvements in functional mobility.    Baseline 8/9: 38    Time 4    Period Weeks    Status New    Target Date 12/19/19      PT LONG TERM GOAL #2   Title Pt. will report no dizziness/imbalance/fatigue after approx 10 mins of yardwork to improve ability to complete ADLs.    Baseline initial: pt feels dizziness/imbalance/fatigue after 10 mins of yardwork    Time 4    Period Weeks    Status New    Target Date 12/19/19      PT LONG TERM GOAL #3   Title Pt will improve Berg balance to 52/56 to decrease falls risk.    Baseline initial: 47/56    Time 4    Period Weeks    Status New    Target Date 12/19/19      PT LONG TERM GOAL #4   Title Pt will improve bilat hip flexion strength to 5/5 to to improve functional mobility.    Baseline initial: R hip flex: 4+/5, L hip flex: 4-/5    Time 4    Period Weeks    Status New    Target Date 12/19/19                 Plan - 12/05/19 1446    Clinical Impression Statement Pt demonstrated difficulty with resisted walking at Nautilus specifically with eccentric portion of wlaking backwards. Pt has difficulty stepping on foam due to poor proprioception in feet 2/2 polyneuropathy. Use of mirror as visual cue for improved proprioception. Pt can continue to benefit from skilled PT to improve balance and reduce risk of falls.    Stability/Clinical Decision Making Evolving/Moderate complexity    Rehab Potential Good    PT Frequency 2x / week    PT Duration 4 weeks    PT Treatment/Interventions ADLs/Self Care Home Management;Electrical Stimulation;Gait training;Stair training;Functional mobility  training;Therapeutic activities;Therapeutic exercise;Balance training;Neuromuscular re-education;Patient/family education    PT Next Visit Plan BLE strength, SLS    PT Home Exercise Plan see HEP    Consulted and Agree with Plan of Care Patient           Patient will benefit from skilled therapeutic intervention in  order to improve the following deficits and impairments:  Abnormal gait, Decreased activity tolerance, Decreased balance, Decreased strength, Impaired sensation, Postural dysfunction, Difficulty walking, Dizziness, Decreased endurance  Visit Diagnosis: Imbalance  Muscle weakness (generalized)  Other abnormalities of gait and mobility     Problem List Patient Active Problem List   Diagnosis Date Noted  . Viral gastroenteritis 08/22/2019  . Acute kidney failure ([place]) 02/18/2019  . Anemia in chronic kidney disease 02/18/2019  . Secondary hyperparathyroidism of renal origin ([place]) 02/18/2019  . Right kidney mass 01/20/2019  . Erectile dysfunction due to arterial insufficiency 11/09/2018  . Respiratory illness 11/05/2018  . Tick bite of knee, initial encounter 08/17/2018  . Left shoulder pain 07/02/2018  . Allergic rhinitis 07/02/2018  . Cervical radiculopathy 11/20/2017  . Toe pain, bilateral 11/20/2017  . Abdominal pain 02/18/2017  . Clavicle enlargement 02/18/2017  . Anxiety 02/18/2017  . Degenerative arthritis of knee, bilateral 09/04/2016  . BPH (benign prostatic hyperplasia) 08/18/2016  . Knee osteoarthritis 08/18/2016  . Hyperlipidemia 05/20/2016  . Essential hypertension 05/31/2015  . Low testosterone 02/27/2015  . Diabetic neuropathy ([place]) 01/05/2013  . Obesity (BMI 30-39.9) 01/05/2013  . Chronic back pain greater than 3 months duration 01/05/2013  . Gout 01/05/2013  . OSA (obstructive sleep apnea) 01/05/2013  . Chronic kidney disease 01/04/2013  . DM type 2 with diabetic peripheral neuropathy ([place]) 01/04/2013  . Hypothyroidism 01/04/2013   Pura Spice, PT, DPT # [id number] Rachael Fee, SPT 12/05/2019, 6:09 PM  [place] [facility] [address] [place], [place], [address] Phone: [phone]   Fax:  [phone]  Name: Douglas [name] MRN: [id number] Date of Birth: [birth year]

## 2019-12-06 NOTE — Telephone Encounter (Signed)
Would he be willing to see Catie to see if he qualifies for assistance with this medication?

## 2019-12-07 ENCOUNTER — Encounter: Payer: Self-pay | Admitting: Physical Therapy

## 2019-12-07 ENCOUNTER — Ambulatory Visit: Payer: Medicare Other | Admitting: Physical Therapy

## 2019-12-07 ENCOUNTER — Other Ambulatory Visit: Payer: Self-pay

## 2019-12-07 DIAGNOSIS — R2689 Other abnormalities of gait and mobility: Secondary | ICD-10-CM

## 2019-12-07 DIAGNOSIS — M6281 Muscle weakness (generalized): Secondary | ICD-10-CM | POA: Diagnosis not present

## 2019-12-07 NOTE — Therapy (Signed)
[place] [facility] [address] [place], [place], [address] Phone: [phone]   Fax:  [phone]  Physical Therapy Treatment  Patient Details  Name: Douglas [name] MRN: [id number] Date of Birth: 1945/08/19 Referring Provider (PT): Anthonette Legato, MD   Encounter Date: 12/07/2019   PT End of Session - 12/07/19 1442    Visit Number 5    Number of Visits 9    Date for PT Re-Evaluation 12/19/19    Authorization - Visit Number 5    Authorization - Number of Visits 10    PT Start Time 6761    PT Stop Time 1534    PT Time Calculation (min) 51 min    Equipment Utilized During Treatment Gait belt    Activity Tolerance Patient tolerated treatment well    Behavior During Therapy [facility] for tasks assessed/performed           Past Medical History:  Diagnosis Date  . Arthritis   . Depression   . Diabetes mellitus with complication ([place])   . Diabetic neuropathy ([place])   . Diastolic dysfunction    a. TTE 07/2017: EF 60-65%, mild concentric LVH, no RWMA, Gr1DD, trivial AI, mildly dilated LA, RVSF normal, PASP normal  . Edema    feet/legs  . GERD (gastroesophageal reflux disease)   . Gout   . History of stress test    a. MV 06/2017: small in size, mild in severity, apical anterior and apical defect that was minimally reversible and most likely represented artifact and less likely ischemia/scar, LVEF 55-65%, low risk, probably normal stress test  . Hypertension   . Hypothyroidism   . Kidney cysts    renal failure 2013  . Kidney stones    Dr. Rogers Blocker  . OSA (obstructive sleep apnea)    supplemental oxygen at night  . Oxygen dependent    hs  . Rocky Mountain spotted fever 12/10/2017   Positve IgG in titer on 11/20/17  . S/P cardiac cath 1998   a. no obstructive disease  . Syncope and collapse   . Temporary low platelet count (HCC)     Past Surgical History:  Procedure Laterality Date  . ANAL FISSURE REPAIR    . BACK SURGERY  1977    rupture disc lumbar spine  . [facility].   Marland Kitchen CATARACT EXTRACTION W/PHACO Right 07/16/2016   Procedure: CATARACT EXTRACTION PHACO AND INTRAOCULAR LENS PLACEMENT (IOC);  Surgeon: [name], MD;  Location: [facility] ORS;  Service: Ophthalmology;  Laterality: Right;  [place] 01:11 AP% 17.6 CDE 24.83 fluid pack lot # [id number]  . CATARACT EXTRACTION W/PHACO Left 08/13/2016   Procedure: CATARACT EXTRACTION PHACO AND INTRAOCULAR LENS PLACEMENT ([place]) suture placed in left eye at end of procedure;  Surgeon: [name], MD;  Location: [facility] ORS;  Service: Ophthalmology;  Laterality: Left;  [place] 01:55 AP% 22.7 CDE 53.56 fluid pack lot # [id number]  . FINGER AMPUTATION     partial  . KNEE SURGERY  1993   arthroscopy  . SHOULDER SURGERY Bilateral 1998   arthroscopic right, rotator cuff repair left    There were no vitals filed for this visit.   Subjective Assessment - 12/07/19 1440    Subjective New onset of nerve pain in L hand along ulnar distribution reported that pt believes is diabetic neuropathy related. Pt reports dizziness at 8/10 today prior to treatment. Primarily occurs after sitting down for long periods  of time.    Pertinent History diabetic neuropathy    How long can you stand comfortably? 10 mins    Patient Stated Goals return to yardwork, walking    Currently in Pain? No/denies          There.ex:   Nu-Step L4 for 10 min. LE's only. Discussion of benefits of performing seated LE exercises as strategy to improve orthostatic symptoms before standing 2/2 pt reports of dizziness with standing and having history of low BP.   Exercises educated on: seated marches, ankle pumps and LAQ's.   Neuro Re-Ed:   Obstacle course: walking over unstable surface (blue mat with ankle weights underneath) --> side ways alternating cone taps --> Tandem walking on long airex pad x3   Side steps on long airex pad: x8. Improved ankle  stability displayed with better control of reducing heels touching floor.  BAPS board: forwards/backwards/R/L x5/direction. Difficulty with controlling motion. 2-3 LOB occurred requiring therapist assist to stop pt from near fall. Intermittent use of hands on // bars.   Backwards walking in hallway (64' one way): x4. Intermittent lateral sway but able to correct on his own with only supervision needed from therapist.     PT Education - 12/07/19 1448    Education Details Performing ankle pumps, LAQ's and seated marches prior to standing to try and improve orthostasis when pt stands after sitting for long periods of time.    Person(s) Educated Patient    Methods Explanation;Demonstration    Comprehension Verbalized understanding               PT Long Term Goals - 11/21/19 1432      PT LONG TERM GOAL #1   Title Pt will increase FOTO score to 53 to display perceived improvements in functional mobility.    Baseline 8/9: 38    Time 4    Period Weeks    Status New    Target Date 12/19/19      PT LONG TERM GOAL #2   Title Pt. will report no dizziness/imbalance/fatigue after approx 10 mins of yardwork to improve ability to complete ADLs.    Baseline initial: pt feels dizziness/imbalance/fatigue after 10 mins of yardwork    Time 4    Period Weeks    Status New    Target Date 12/19/19      PT LONG TERM GOAL #3   Title Pt will improve Berg balance to 52/56 to decrease falls risk.    Baseline initial: 47/56    Time 4    Period Weeks    Status New    Target Date 12/19/19      PT LONG TERM GOAL #4   Title Pt will improve bilat hip flexion strength to 5/5 to to improve functional mobility.    Baseline initial: R hip flex: 4+/5, L hip flex: 4-/5    Time 4    Period Weeks    Status New    Target Date 12/19/19                 Plan - 12/07/19 1443    Clinical Impression Statement Pt demonstrating improvements with side steps on airex pad with no LOB or use of ankle strategy.  Pt demonstrates fatigue and difficulty with BAPS board going forwards/backlwards and side to side with 3 LOB requiring therapist correction to prevent falls. Pt is having difficulty maintaining SLS dynamically with stepping over tall hurdles without lat lean. On unstable surfaces and with vision removed,  pt is significantly challenged with balance and requires CGA+1 and intermittent therapist assist to correct LOB. Pt educated on marches, ankle pumps, and LAQ's before standing up after sitting for periods of time to reduce orthostatic symptoms and dizziness with standing. Pt can continue to benefit from skilled PT to improve balance and BLE strength to reduce risk of falls and improve functional mobility.    Stability/Clinical Decision Making Evolving/Moderate complexity    Clinical Decision Making Moderate    Rehab Potential Good    PT Frequency 2x / week    PT Duration 4 weeks    PT Treatment/Interventions ADLs/Self Care Home Management;Electrical Stimulation;Gait training;Stair training;Functional mobility training;Therapeutic activities;Therapeutic exercise;Balance training;Neuromuscular re-education;Patient/family education    PT Next Visit Plan BLE strength, SLS    PT Home Exercise Plan see HEP    Consulted and Agree with Plan of Care Patient           Patient will benefit from skilled therapeutic intervention in order to improve the following deficits and impairments:  Abnormal gait, Decreased activity tolerance, Decreased balance, Decreased strength, Impaired sensation, Postural dysfunction, Difficulty walking, Dizziness, Decreased endurance  Visit Diagnosis: Imbalance  Muscle weakness (generalized)  Other abnormalities of gait and mobility     Problem List Patient Active Problem List   Diagnosis Date Noted  . Viral gastroenteritis 08/22/2019  . Acute kidney failure ([place]) 02/18/2019  . Anemia in chronic kidney disease 02/18/2019  . Secondary hyperparathyroidism of renal  origin ([place]) 02/18/2019  . Right kidney mass 01/20/2019  . Erectile dysfunction due to arterial insufficiency 11/09/2018  . Respiratory illness 11/05/2018  . Tick bite of knee, initial encounter 08/17/2018  . Left shoulder pain 07/02/2018  . Allergic rhinitis 07/02/2018  . Cervical radiculopathy 11/20/2017  . Toe pain, bilateral 11/20/2017  . Abdominal pain 02/18/2017  . Clavicle enlargement 02/18/2017  . Anxiety 02/18/2017  . Degenerative arthritis of knee, bilateral 09/04/2016  . BPH (benign prostatic hyperplasia) 08/18/2016  . Knee osteoarthritis 08/18/2016  . Hyperlipidemia 05/20/2016  . Essential hypertension 05/31/2015  . Low testosterone 02/27/2015  . Diabetic neuropathy ([place]) 01/05/2013  . Obesity (BMI 30-39.9) 01/05/2013  . Chronic back pain greater than 3 months duration 01/05/2013  . Gout 01/05/2013  . OSA (obstructive sleep apnea) 01/05/2013  . Chronic kidney disease 01/04/2013  . DM type 2 with diabetic peripheral neuropathy ([place]) 01/04/2013  . Hypothyroidism 01/04/2013   Pura Spice, PT, DPT # [id number], SPT 12/07/2019, 3:04 PM  [place] [facility] [address] [place], [place], [address] Phone: [phone]   Fax:  [phone]  Name: Douglas [name] MRN: [id number] Date of Birth: Mar 27, 1946

## 2019-12-07 NOTE — Telephone Encounter (Signed)
No answer, no voicemail.

## 2019-12-09 NOTE — Telephone Encounter (Signed)
Patient is agreeable to see Catie

## 2019-12-09 NOTE — Addendum Note (Signed)
Addended by: [name] on: 12/09/2019 04:42 PM   Modules accepted: Orders

## 2019-12-09 NOTE — Telephone Encounter (Signed)
Referral placed.

## 2019-12-11 ENCOUNTER — Other Ambulatory Visit: Payer: Self-pay | Admitting: Family Medicine

## 2019-12-12 ENCOUNTER — Telehealth: Payer: Self-pay

## 2019-12-12 NOTE — Chronic Care Management (AMB) (Signed)
  Chronic Care Management   Note  12/12/2019 Name: Douglas [name] MRN: [id number] DOB: 1946/04/05  [name] is a 74 y.o. year old male who is a primary care patient of [name], [name], MD. I reached out to [name] by phone today in response to a referral sent by [name]'s PCP, Dr. [name]     [name] was given information about Chronic Care Management services today including:  1. CCM service includes personalized support from designated clinical staff supervised by his physician, including individualized plan of care and coordination with other care providers 2. 24/7 contact phone numbers for assistance for urgent and routine care needs. 3. Service will only be billed when office clinical staff spend 20 minutes or more in a month to coordinate care. 4. Only one practitioner may furnish and bill the service in a calendar month. 5. The patient may stop CCM services at any time (effective at the end of the month) by phone call to the office staff. 6. The patient will be responsible for cost sharing (co-pay) of up to 20% of the service fee (after annual deductible is met).  Patient agreed to services and verbal consent obtained.   Follow up plan: Telephone appointment with care management team member scheduled for:01/05/2020  Noreene Larsson, [address], [place], [place] [address] Direct Dial: [phone] [email or web address]

## 2019-12-14 ENCOUNTER — Ambulatory Visit: Payer: Medicare Other | Admitting: Physical Therapy

## 2019-12-14 IMAGING — DX DG CERVICAL SPINE COMPLETE 4+V
6 series · 6 of 6 positions shown · non-contrast
Comparison: None.

CLINICAL DATA: Pt states he has been having neck pain that radiates
down both shoulder. Pt denies any injury but state he has had
rotator cuff surgery.

EXAM:
CERVICAL SPINE - COMPLETE 4+ VIEW

[cervical spine ap]
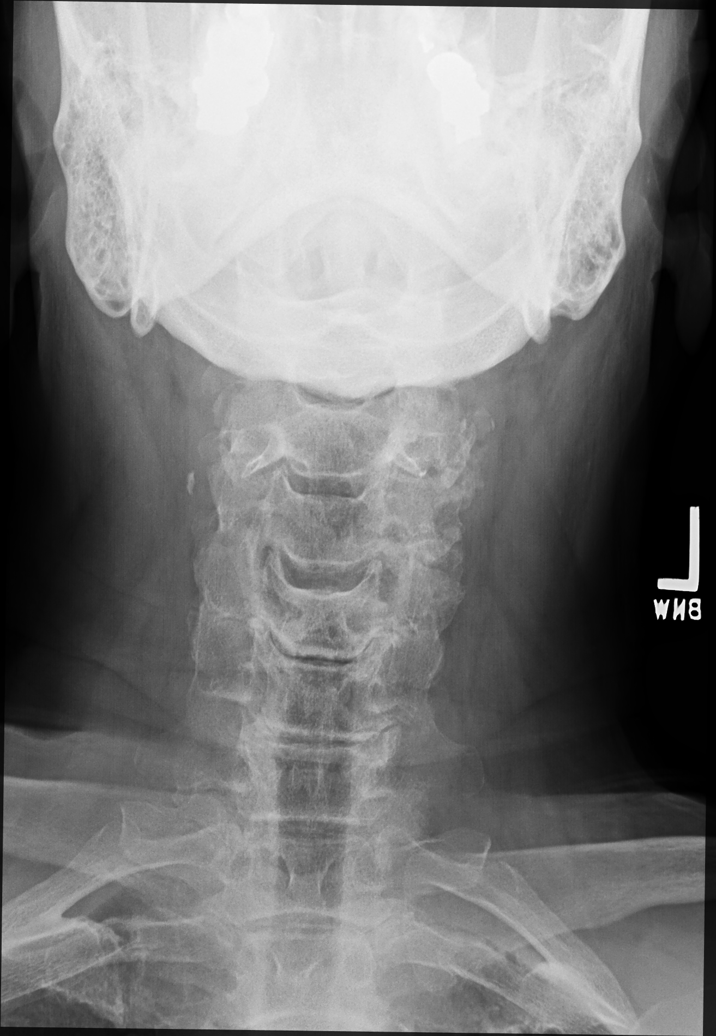

[cervical spine oblique (1 of 2)]
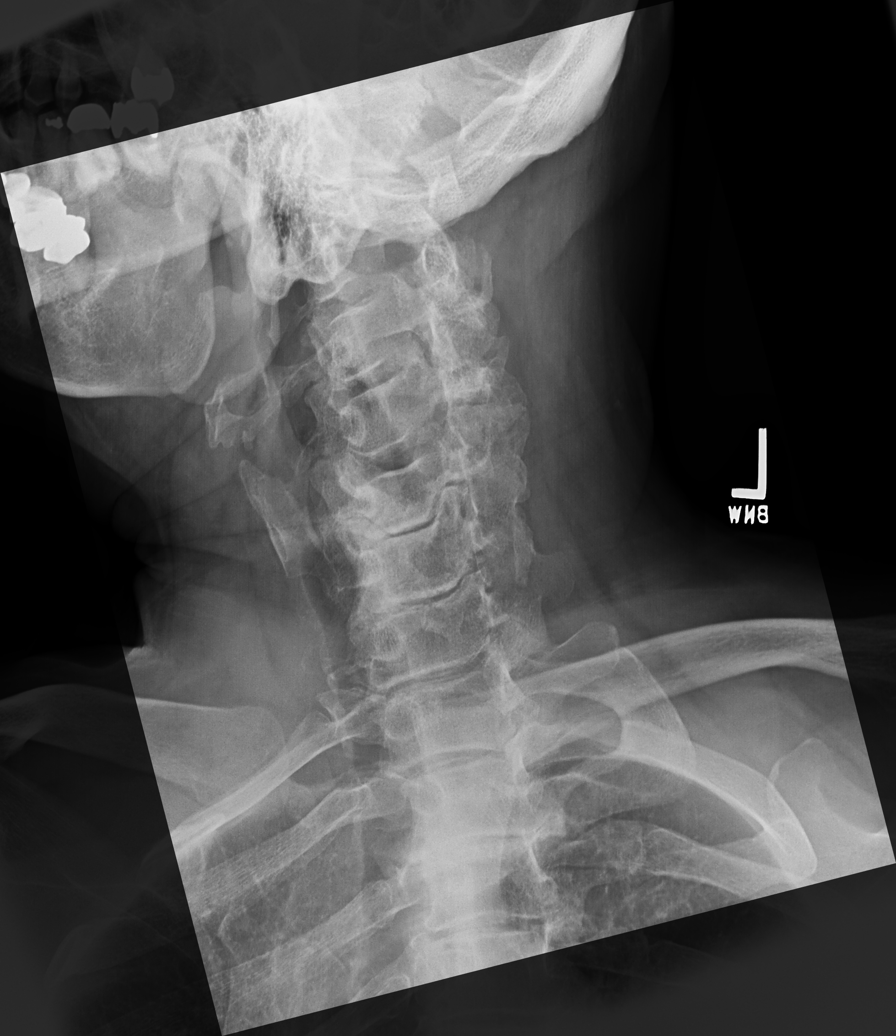

[cervical spine oblique (2 of 2)]
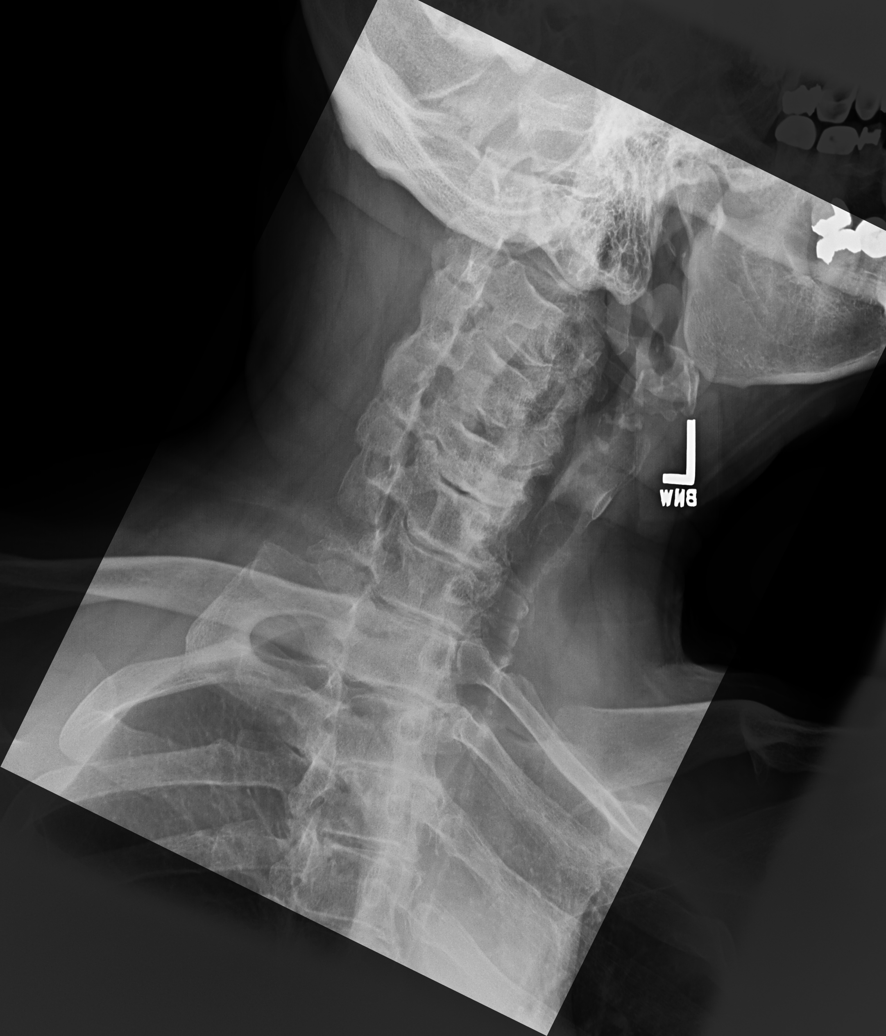

[cervical spine lat]
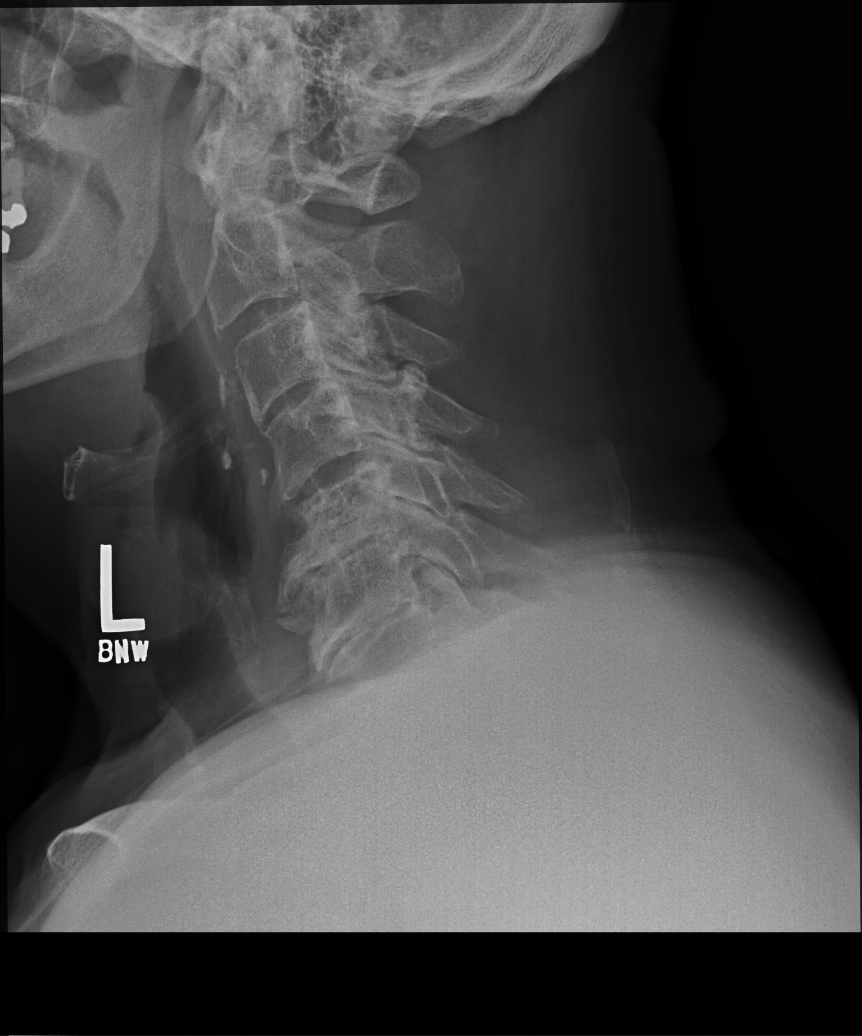

[swimmers lat]
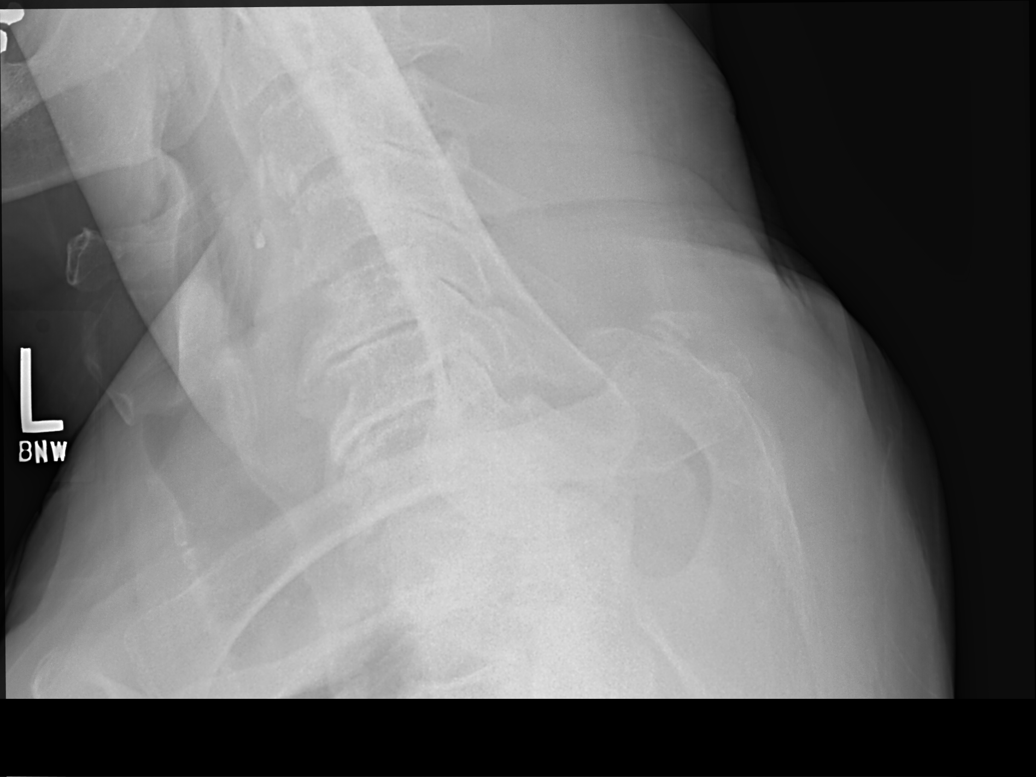

[cervical spine open mouth ap]
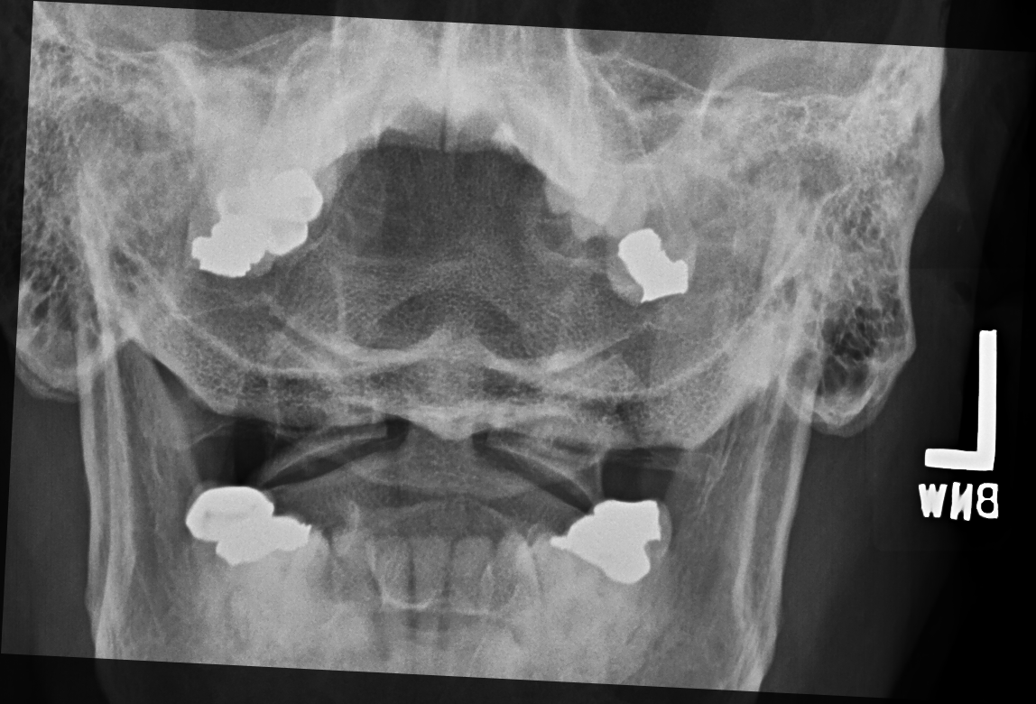

[6 of 6 positions shown; findings below may reference images not displayed]

FINDINGS: No fracture. No spondylolisthesis. There is generalized
straightening of the normal cervical lordosis.

Moderate loss of disc height at C5-C6 and C6-C7 with prominent
endplate osteophytes. Mild loss of disc height at C3-C4. There are
anterior endplate osteophytes at C4-C5. Mild facet degenerative
changes are noted on the left along the mid cervical spine.

Positioning limits assessment of the neural foramina.

Soft tissues are unremarkable.
IMPRESSION: 1. No fracture or acute finding.
2. Disk degenerative changes most evident at C5-C6 and C6-C7.
Consider follow-up cervical MRI for further assessment given the
history of pain radiating down both shoulders.

## 2019-12-20 ENCOUNTER — Ambulatory Visit: Payer: Medicare Other | Admitting: Physical Therapy

## 2019-12-21 ENCOUNTER — Ambulatory Visit: Payer: Medicare Other | Admitting: Physical Therapy

## 2019-12-26 ENCOUNTER — Other Ambulatory Visit: Payer: Self-pay

## 2019-12-26 ENCOUNTER — Ambulatory Visit: Payer: Medicare Other | Attending: Nephrology

## 2019-12-26 DIAGNOSIS — M6281 Muscle weakness (generalized): Secondary | ICD-10-CM | POA: Diagnosis not present

## 2019-12-26 DIAGNOSIS — R2689 Other abnormalities of gait and mobility: Secondary | ICD-10-CM | POA: Insufficient documentation

## 2019-12-26 NOTE — Therapy (Signed)
[place] [facility] [address] [place], [place], [address] Phone: [phone]   Fax:  [phone]  Physical Therapy Treatment/Recertification 11/21/2019-12/26/2019  Patient Details  Name: Douglas [name] MRN: [id number] Date of Birth: 10/20/1945 Referring Provider (PT): Munsoor Lateef, MD   Encounter Date: 12/26/2019   PT End of Session - 12/26/19 1308    Visit Number 6    Number of Visits 17    Date for PT Re-Evaluation 01/23/20    Authorization - Visit Number 1    Authorization - Number of Visits 10    PT Start Time 1303    PT Stop Time 1347    PT Time Calculation (min) 44 min    Equipment Utilized During Treatment Gait belt    Activity Tolerance Patient tolerated treatment well    Behavior During Therapy WFL for tasks assessed/performed           Past Medical History:  Diagnosis Date  . Arthritis   . Depression   . Diabetes mellitus with complication (HCC)   . Diabetic neuropathy (HCC)   . Diastolic dysfunction    a. TTE 07/2017: EF 60-65%, mild concentric LVH, no RWMA, Gr1DD, trivial AI, mildly dilated LA, RVSF normal, PASP normal  . Edema    feet/legs  . GERD (gastroesophageal reflux disease)   . Gout   . History of stress test    a. MV 06/2017: small in size, mild in severity, apical anterior and apical defect that was minimally reversible and most likely represented artifact and less likely ischemia/scar, LVEF 55-65%, low risk, probably normal stress test  . Hypertension   . Hypothyroidism   . Kidney cysts    renal failure 2013  . Kidney stones    Dr. Wolfe  . OSA (obstructive sleep apnea)    supplemental oxygen at night  . Oxygen dependent    hs  . Rocky Mountain spotted fever 12/10/2017   Positve IgG in titer on 11/20/17  . S/P cardiac cath 1998   a. no obstructive disease  . Syncope and collapse   . Temporary low platelet count (HCC)     Past Surgical History:  Procedure Laterality Date  . ANAL FISSURE  REPAIR    . BACK SURGERY  1977   rupture disc lumbar spine  . CARDIAC CATHETERIZATION  1998   [place] with [facility].   . CATARACT EXTRACTION W/PHACO Right 07/16/2016   Procedure: CATARACT EXTRACTION PHACO AND INTRAOCULAR LENS PLACEMENT (IOC);  Surgeon: Steven Dingeldein, MD;  Location: [facility] ORS;  Service: Ophthalmology;  Laterality: Right;  [place] 01:11 AP% 17.6 CDE 24.83 fluid pack lot # [id number]  . CATARACT EXTRACTION W/PHACO Left 08/13/2016   Procedure: CATARACT EXTRACTION PHACO AND INTRAOCULAR LENS PLACEMENT (IOC) suture placed in left eye at end of procedure;  Surgeon: Steven Dingeldein, MD;  Location: [facility] ORS;  Service: Ophthalmology;  Laterality: Left;  US 01:55 AP% 22.7 CDE 53.56 fluid pack lot # [id number]  . FINGER AMPUTATION     partial  . KNEE SURGERY  1993   arthroscopy  . SHOULDER SURGERY Bilateral 1998   arthroscopic right, rotator cuff repair left    There were no vitals filed for this visit.   Subjective Assessment - 12/26/19 1306    Subjective Pt. states that he feels like he's regressed in sx a bit. Pt. states the dizziness has been slightly worse at times, but comes back down. Pt. states that he has had a few episodes   of lower blood pressure.    Pertinent History diabetic neuropathy    How long can you stand comfortably? 10 mins    Patient Stated Goals return to yardwork, walking    Currently in Pain? No/denies              [facility] PT Assessment - 12/26/19 0001      Dynamic Gait Index   Level Surface Mild Impairment    Change in Gait Speed Mild Impairment   unable to demonstrate significant slow gait speed   Gait with Horizontal Head Turns Moderate Impairment    Gait with Vertical Head Turns Mild Impairment    Gait and Pivot Turn Normal    Step Over Obstacle Normal    Step Around Obstacles Normal    Steps Moderate Impairment    Total Score 17          Neuro re-ed:  Berg balance reassessment: pt scored 54/56. Able to complete  SLS on L for longer time than R. This displays pt is a low falls risk.   5xSTS assessment: 15.51s  DGI: 17/24. Most difficulties with horizontal head turns, followed by vertical head turns, and stairs. Increased risk of falls with community ambulation tasks.   FOTO reassessment: 43/53. Making progress toward goal.   Pt educated on HEP including STS and calf raises at counter top.     PT Long Term Goals - 12/26/19 1321      PT LONG TERM GOAL #1   Title Pt will increase FOTO score to 53 to display perceived improvements in functional mobility.    Baseline 8/9: 38; 9/13:43    Time 4    Period Weeks    Status Partially Met    Target Date 01/23/20      PT LONG TERM GOAL #2   Title Pt. will report no dizziness/imbalance/fatigue after approx 10 mins of yardwork to improve ability to complete ADLs.    Baseline initial: pt feels dizziness/imbalance/fatigue after 10 mins of yardwork; 9/13: pt states that he feels he is able to complete more than 10 mins    Time 4    Period Weeks    Status Achieved    Target Date 12/26/19      PT LONG TERM GOAL #3   Title Pt will improve Berg balance to 52/56 to decrease falls risk.    Baseline initial: 47/56; 9/13: 54/56    Time 4    Period Weeks    Status Achieved    Target Date 12/26/19      PT LONG TERM GOAL #4   Title Pt will improve bilat hip flexion strength to 5/5 to to improve functional mobility.    Baseline initial: R hip flex: 4+/5, L hip flex: 4-/5; 9/13: R:4/5, L:4/5    Time 4    Period Weeks    Status Partially Met    Target Date 01/23/20      PT LONG TERM GOAL #5   Title Pt. will score at least 22/24 on DGI to decrease falls risk during ambulation.    Baseline 9/13: 17/24    Time 4    Period Weeks    Status New    Target Date 01/23/20      Additional Long Term Goals   Additional Long Term Goals Yes      PT LONG TERM GOAL #6   Title Pt. will complete 5xSTS in at least 12 seconds to improve strength to improve functional  mobility.  Baseline 9/13: 15.51s    Time 4    Period Weeks    Status New    Target Date 01/23/20                 Plan - 12/26/19 1347    Clinical Impression Statement Pt. returned to therapy after quarantine. Pt. reassessed goals today, has met most of existing goals, has new goals added. See updated goals for details. Pt. completed DGI and scored 17/24, experienced most difficulty with horizontal and vertical head turns, as well as stairs. Pt. states most of his balance is better, but is still experiencing some dizziness and wants to improve strength in bilat LEs. Pt. will continue to benefit from skilled PT to improve balance and strength to decrease falls risk.    Stability/Clinical Decision Making Evolving/Moderate complexity    Clinical Decision Making Moderate    Rehab Potential Good    PT Frequency 2x / week    PT Duration 4 weeks    PT Treatment/Interventions ADLs/Self Care Home Management;Electrical Stimulation;Gait training;Stair training;Functional mobility training;Therapeutic activities;Therapeutic exercise;Balance training;Neuromuscular re-education;Patient/family education    PT Next Visit Plan BLE strength, SLS    PT Home Exercise Plan [email or web address]    Consulted and Agree with Plan of Care Patient           Patient will benefit from skilled therapeutic intervention in order to improve the following deficits and impairments:  Abnormal gait, Decreased activity tolerance, Decreased balance, Decreased strength, Impaired sensation, Postural dysfunction, Difficulty walking, Dizziness, Decreased endurance  Visit Diagnosis: Imbalance  Muscle weakness (generalized)  Other abnormalities of gait and mobility     Problem List Patient Active Problem List   Diagnosis Date Noted  . Viral gastroenteritis 08/22/2019  . Acute kidney failure (HCC) 02/18/2019  . Anemia in chronic kidney disease 02/18/2019  . Secondary hyperparathyroidism of renal origin (HCC) 02/18/2019  .  Right kidney mass 01/20/2019  . Erectile dysfunction due to arterial insufficiency 11/09/2018  . Respiratory illness 11/05/2018  . Tick bite of knee, initial encounter 08/17/2018  . Left shoulder pain 07/02/2018  . Allergic rhinitis 07/02/2018  . Cervical radiculopathy 11/20/2017  . Toe pain, bilateral 11/20/2017  . Abdominal pain 02/18/2017  . Clavicle enlargement 02/18/2017  . Anxiety 02/18/2017  . Degenerative arthritis of knee, bilateral 09/04/2016  . BPH (benign prostatic hyperplasia) 08/18/2016  . Knee osteoarthritis 08/18/2016  . Hyperlipidemia 05/20/2016  . Essential hypertension 05/31/2015  . Low testosterone 02/27/2015  . Diabetic neuropathy (HCC) 01/05/2013  . Obesity (BMI 30-39.9) 01/05/2013  . Chronic back pain greater than 3 months duration 01/05/2013  . Gout 01/05/2013  . OSA (obstructive sleep apnea) 01/05/2013  . Chronic kidney disease 01/04/2013  . DM type 2 with diabetic peripheral neuropathy (HCC) 01/04/2013  . Hypothyroidism 01/04/2013    Milton Fairly, SPT 12/26/2019, 1:54 PM  [place] [facility] [address] [place], [place], [address] Phone: [phone]   Fax:  [phone]  Name: Douglas [name] MRN: [id number] Date of Birth: 03/14/1946   

## 2019-12-26 NOTE — Patient Instructions (Signed)
Access Code: [email or web address] URL: [email or web address] Date: 12/26/2019 Prepared by: Lieutenant Diego Exercises Heel Toe Raises with Unilateral Counter Support - 1 x daily - 3 x weekly - 2 sets - 20 reps Sit to Stand with Hands on Knees - 1 x daily - 3 x weekly - 3 sets - 12 reps

## 2019-12-28 ENCOUNTER — Other Ambulatory Visit: Payer: Self-pay

## 2019-12-28 ENCOUNTER — Ambulatory Visit: Payer: Medicare Other

## 2019-12-28 ENCOUNTER — Encounter: Payer: Self-pay | Admitting: Physical Therapy

## 2019-12-28 DIAGNOSIS — R2689 Other abnormalities of gait and mobility: Secondary | ICD-10-CM | POA: Diagnosis not present

## 2019-12-28 DIAGNOSIS — M6281 Muscle weakness (generalized): Secondary | ICD-10-CM | POA: Diagnosis not present

## 2019-12-28 NOTE — Therapy (Signed)
[facility] [address] [place], [place], [address] Phone: [phone]   Fax:  [phone]  Physical Therapy Treatment  Patient Details  Name: Douglas [name] MRN: [id number] Date of Birth: 05/10/1945 Referring Provider (PT): Munsoor Lateef, MD   Encounter Date: 12/28/2019   PT End of Session - 12/28/19 1440    Visit Number 7    Number of Visits 17    Date for PT Re-Evaluation 01/23/20    Authorization - Visit Number 2    Authorization - Number of Visits 10    PT Start Time 1348    PT Stop Time 1436    PT Time Calculation (min) 48 min    Equipment Utilized During Treatment Gait belt    Activity Tolerance Patient tolerated treatment well    Behavior During Therapy WFL for tasks assessed/performed           Past Medical History:  Diagnosis Date  . Arthritis   . Depression   . Diabetes mellitus with complication (HCC)   . Diabetic neuropathy (HCC)   . Diastolic dysfunction    a. TTE 07/2017: EF 60-65%, mild concentric LVH, no RWMA, Gr1DD, trivial AI, mildly dilated LA, RVSF normal, PASP normal  . Edema    feet/legs  . GERD (gastroesophageal reflux disease)   . Gout   . History of stress test    a. MV 06/2017: small in size, mild in severity, apical anterior and apical defect that was minimally reversible and most likely represented artifact and less likely ischemia/scar, LVEF 55-65%, low risk, probably normal stress test  . Hypertension   . Hypothyroidism   . Kidney cysts    renal failure 2013  . Kidney stones    Dr. Wolfe  . OSA (obstructive sleep apnea)    supplemental oxygen at night  . Oxygen dependent    hs  . [place] spotted fever 12/10/2017   Positve IgG in titer on 11/20/17  . S/P cardiac cath 1998   a. no obstructive disease  . Syncope and collapse   . Temporary low platelet count (HCC)     Past Surgical History:  Procedure Laterality Date  . ANAL FISSURE REPAIR    . BACK SURGERY  1977    rupture disc lumbar spine  . CARDIAC CATHETERIZATION  1998   [place] with [facility].   . CATARACT EXTRACTION W/PHACO Right 07/16/2016   Procedure: CATARACT EXTRACTION PHACO AND INTRAOCULAR LENS PLACEMENT (IOC);  Surgeon: Steven Dingeldein, MD;  Location: [facility] ORS;  Service: Ophthalmology;  Laterality: Right;  US 01:11 AP% 17.6 CDE 24.83 fluid pack lot # [id number]  . CATARACT EXTRACTION W/PHACO Left 08/13/2016   Procedure: CATARACT EXTRACTION PHACO AND INTRAOCULAR LENS PLACEMENT (IOC) suture placed in left eye at end of procedure;  Surgeon: Steven Dingeldein, MD;  Location: [facility] ORS;  Service: Ophthalmology;  Laterality: Left;  US 01:55 AP% 22.7 CDE 53.56 fluid pack lot # [id number]  . FINGER AMPUTATION     partial  . KNEE SURGERY  1993   arthroscopy  . SHOULDER SURGERY Bilateral 1998   arthroscopic right, rotator cuff repair left    There were no vitals filed for this visit.   Subjective Assessment - 12/28/19 1351    Subjective Pt reports soreness in LE's from sit to stands. Occasional dizziness with standing when he does not perform ROM exercises. No near falls or falls reported.    Pertinent History diabetic neuropathy      How long can you stand comfortably? 10 mins    Patient Stated Goals return to yardwork, walking    Currently in Pain? No/denies           There.ex:   Nu-Step L4 UE's/LE's. 6 min 6" step ups forwards bilateral: 1x12. CGA+1 6" step ups lateral R/L bilateral: 1x12. Intermittent difficulty with getting foot off step to floor. CGA+1 STS: 2x10.  Standing B hip abduction: 3lbs ankle weights. 2x8 on LLE. 1x8, RLE with no ankle weight. Verbal and tactile cues for form/technique. Compensations B with lat lean to contralateral side due to glut med weakness.    Neuro Re-Ed: CGA+1 throughout all balance tasks   Walking in hallway (64'): horizontal and vertical head turns. Intermittent LOB requiring therapist assist to correct. Verbal cues for  rotating head further in available ROM for increased challenge to balance. Reports of dizziness with both head turn directions requiring verbal cues to stop. Blood SPO2 measured at 98%.   Walking backwards in hallway 46' x2 for balance and glut/hamstring strength.     PT Long Term Goals - 12/26/19 1321      PT LONG TERM GOAL #1   Title Pt will increase FOTO score to 53 to display perceived improvements in functional mobility.    Baseline 8/9: 38; 9/13:43    Time 4    Period Weeks    Status Partially Met    Target Date 01/23/20      PT LONG TERM GOAL #2   Title Pt. will report no dizziness/imbalance/fatigue after approx 10 mins of yardwork to improve ability to complete ADLs.    Baseline initial: pt feels dizziness/imbalance/fatigue after 10 mins of yardwork; 9/13: pt states that he feels he is able to complete more than 10 mins    Time 4    Period Weeks    Status Achieved    Target Date 12/26/19      PT LONG TERM GOAL #3   Title Pt will improve Berg balance to 52/56 to decrease falls risk.    Baseline initial: 47/56; 9/13: 54/56    Time 4    Period Weeks    Status Achieved    Target Date 12/26/19      PT LONG TERM GOAL #4   Title Pt will improve bilat hip flexion strength to 5/5 to to improve functional mobility.    Baseline initial: R hip flex: 4+/5, L hip flex: 4-/5; 9/13: R:4/5, L:4/5    Time 4    Period Weeks    Status Partially Met    Target Date 01/23/20      PT LONG TERM GOAL #5   Title Pt. will score at least 22/24 on DGI to decrease falls risk during ambulation.    Baseline 9/13: 17/24    Time 4    Period Weeks    Status New    Target Date 01/23/20      Additional Long Term Goals   Additional Long Term Goals Yes      PT LONG TERM GOAL #6   Title Pt. will complete 5xSTS in at least 12 seconds to improve strength to improve functional mobility.    Baseline 9/13: 15.51s    Time 4    Period Weeks    Status New    Target Date 01/23/20                  Plan - 12/28/19 1440    Clinical Impression Statement Pt displayed difficulty with  horizontal and vertical head turns with reports of dizziness and intermittent LOB requiring minA+1 from therapist to correct. Noticed trendelenburg with gait in hallway so standing hip abduction assessed in standing for glut med strength. Pt required mod tactile cues to maintain form/technique for correct exercise form with R displaying more weakness compared to L requiring no ankle weights to complete successfully. Pt further educated on techniques for reducing orthstatic episodes when going from seated to standing positions. Pt verbalized understanding. Pt can continue to benefit from skilled PT to reduce risk of falls and improve BLE strength for functional mobility.    Stability/Clinical Decision Making Evolving/Moderate complexity    Rehab Potential Good    PT Frequency 2x / week    PT Duration 4 weeks    PT Treatment/Interventions ADLs/Self Care Home Management;Electrical Stimulation;Gait training;Stair training;Functional mobility training;Therapeutic activities;Therapeutic exercise;Balance training;Neuromuscular re-education;Patient/family education    PT Next Visit Plan BLE strength, SLS    PT Home Exercise Plan [id number]    Consulted and Agree with Plan of Care Patient           Patient will benefit from skilled therapeutic intervention in order to improve the following deficits and impairments:  Abnormal gait, Decreased activity tolerance, Decreased balance, Decreased strength, Impaired sensation, Postural dysfunction, Difficulty walking, Dizziness, Decreased endurance  Visit Diagnosis: Imbalance  Other abnormalities of gait and mobility  Muscle weakness (generalized)     Problem List Patient Active Problem List   Diagnosis Date Noted  . Viral gastroenteritis 08/22/2019  . Acute kidney failure ([place]) 02/18/2019  . Anemia in chronic kidney disease 02/18/2019  . Secondary  hyperparathyroidism of renal origin ([place]) 02/18/2019  . Right kidney mass 01/20/2019  . Erectile dysfunction due to arterial insufficiency 11/09/2018  . Respiratory illness 11/05/2018  . Tick bite of knee, initial encounter 08/17/2018  . Left shoulder pain 07/02/2018  . Allergic rhinitis 07/02/2018  . Cervical radiculopathy 11/20/2017  . Toe pain, bilateral 11/20/2017  . Abdominal pain 02/18/2017  . Clavicle enlargement 02/18/2017  . Anxiety 02/18/2017  . Degenerative arthritis of knee, bilateral 09/04/2016  . BPH (benign prostatic hyperplasia) 08/18/2016  . Knee osteoarthritis 08/18/2016  . Hyperlipidemia 05/20/2016  . Essential hypertension 05/31/2015  . Low testosterone 02/27/2015  . Diabetic neuropathy ([place]) 01/05/2013  . Obesity (BMI 30-39.9) 01/05/2013  . Chronic back pain greater than 3 months duration 01/05/2013  . Gout 01/05/2013  . OSA (obstructive sleep apnea) 01/05/2013  . Chronic kidney disease 01/04/2013  . DM type 2 with diabetic peripheral neuropathy ([place]) 01/04/2013  . Hypothyroidism 01/04/2013    Larna Daughters, SPT 12/29/2019, 8:33 AM  [facility] [address] [place], [place], [address] Phone: [phone]   Fax:  [phone]  Name: DARRNELL MANGIARACINA MRN: [id number] Date of Birth: 12-28-1945

## 2020-01-02 ENCOUNTER — Other Ambulatory Visit: Payer: Self-pay

## 2020-01-02 ENCOUNTER — Ambulatory Visit: Payer: Medicare Other | Admitting: Physical Therapy

## 2020-01-02 ENCOUNTER — Encounter: Payer: Self-pay | Admitting: Physical Therapy

## 2020-01-02 DIAGNOSIS — R2689 Other abnormalities of gait and mobility: Secondary | ICD-10-CM | POA: Diagnosis not present

## 2020-01-02 DIAGNOSIS — M6281 Muscle weakness (generalized): Secondary | ICD-10-CM

## 2020-01-02 NOTE — Therapy (Signed)
[facility] [address] [place], [place], [address] Phone: [phone]   Fax:  [phone]  Physical Therapy Treatment  Patient Details  Name: Douglas [name] MRN: [id number] Date of Birth: Nov 14, 1945 Referring Provider (PT): Anthonette Legato, MD   Encounter Date: 01/02/2020   PT End of Session - 01/02/20 1259    Visit Number 8    Number of Visits 17    Date for PT Re-Evaluation 01/23/20    Authorization - Visit Number 3    Authorization - Number of Visits 10    PT Start Time 3244    PT Stop Time 1342    PT Time Calculation (min) 47 min    Equipment Utilized During Treatment Gait belt    Activity Tolerance Patient tolerated treatment well    Behavior During Therapy [facility] for tasks assessed/performed           Past Medical History:  Diagnosis Date  . Arthritis   . Depression   . Diabetes mellitus with complication ([place])   . Diabetic neuropathy ([place])   . Diastolic dysfunction    a. TTE 07/2017: EF 60-65%, mild concentric LVH, no RWMA, Gr1DD, trivial AI, mildly dilated LA, RVSF normal, PASP normal  . Edema    feet/legs  . GERD (gastroesophageal reflux disease)   . Gout   . History of stress test    a. MV 06/2017: small in size, mild in severity, apical anterior and apical defect that was minimally reversible and most likely represented artifact and less likely ischemia/scar, LVEF 55-65%, low risk, probably normal stress test  . Hypertension   . Hypothyroidism   . Kidney cysts    renal failure 2013  . Kidney stones    Dr. Rogers Blocker  . OSA (obstructive sleep apnea)    supplemental oxygen at night  . Oxygen dependent    hs  . Rocky [place] spotted fever 12/10/2017   Positve IgG in titer on 11/20/17  . S/P cardiac cath 1998   a. no obstructive disease  . Syncope and collapse   . Temporary low platelet count (HCC)     Past Surgical History:  Procedure Laterality Date  . ANAL FISSURE REPAIR    . BACK SURGERY  1977    rupture disc lumbar spine  . [facility].   [facility] CATARACT EXTRACTION W/PHACO Right 07/16/2016   Procedure: CATARACT EXTRACTION PHACO AND INTRAOCULAR LENS PLACEMENT (IOC);  Surgeon: [name], MD;  Location: [facility] ORS;  Service: Ophthalmology;  Laterality: Right;  [place] 01:11 AP% 17.6 CDE 24.83 fluid pack lot # [id number]  . CATARACT EXTRACTION W/PHACO Left 08/13/2016   Procedure: CATARACT EXTRACTION PHACO AND INTRAOCULAR LENS PLACEMENT ([place]) suture placed in left eye at end of procedure;  Surgeon: [name], MD;  Location: [facility] ORS;  Service: Ophthalmology;  Laterality: Left;  [place] 01:55 AP% 22.7 CDE 53.56 fluid pack lot # [id number]  . FINGER AMPUTATION     partial  . KNEE SURGERY  1993   arthroscopy  . SHOULDER SURGERY Bilateral 1998   arthroscopic right, rotator cuff repair left    There were no vitals filed for this visit.   Subjective Assessment - 01/02/20 1257    Subjective Pt reports soreness in BLE's from practicing STS's at home. No falls or stumbles. Reports continued difficulty with walking on uneven surfaces/terrain.    Pertinent History diabetic neuropathy    How long can you  stand comfortably? 10 mins    Patient Stated Goals return to yardwork, walking    Currently in Pain? No/denies    Pain Score 0-No pain           There.ex:   Nu-Step L5 for 5 min. UE/LE use  STS with blue mat under feet to challenge balance. 1x10, no balance difficulty   STS with airex pad under feet for balance: 2x6, noticeable ankle weakness and sway. CGA+1   Neuro Re-Ed:   Ambulated outside over inclines/declines, stepping over curbs and asc/desc full flight of stairs. Verbal cues for asc/desc stairs without rotating hips as compensatory strategy.   Side steps on airex pad in // bars: x8. CGA+1   Tandem walking on airex pad in // bars: x8. CGA+1  Backwards walking in hallway: 4x64'. Verbal cues for last 2 bouts to  increase hip extension to take larger steps. Noticeable sway and variable step lengths due to difficulty balancing after cuing. Required therapist Min assist x1 for LOB correction.   PT Education - 01/02/20 1259    Education Details Form/technique with exercise.    Person(s) Educated Patient    Methods Explanation    Comprehension Verbalized understanding               PT Long Term Goals - 12/26/19 1321      PT LONG TERM GOAL #1   Title Pt will increase FOTO score to 53 to display perceived improvements in functional mobility.    Baseline 8/9: 38; 9/13:43    Time 4    Period Weeks    Status Partially Met    Target Date 01/23/20      PT LONG TERM GOAL #2   Title Pt. will report no dizziness/imbalance/fatigue after approx 10 mins of yardwork to improve ability to complete ADLs.    Baseline initial: pt feels dizziness/imbalance/fatigue after 10 mins of yardwork; 9/13: pt states that he feels he is able to complete more than 10 mins    Time 4    Period Weeks    Status Achieved    Target Date 12/26/19      PT LONG TERM GOAL #3   Title Pt will improve Berg balance to 52/56 to decrease falls risk.    Baseline initial: 47/56; 9/13: 54/56    Time 4    Period Weeks    Status Achieved    Target Date 12/26/19      PT LONG TERM GOAL #4   Title Pt will improve bilat hip flexion strength to 5/5 to to improve functional mobility.    Baseline initial: R hip flex: 4+/5, L hip flex: 4-/5; 9/13: R:4/5, L:4/5    Time 4    Period Weeks    Status Partially Met    Target Date 01/23/20      PT LONG TERM GOAL #5   Title Pt. will score at least 22/24 on DGI to decrease falls risk during ambulation.    Baseline 9/13: 17/24    Time 4    Period Weeks    Status New    Target Date 01/23/20      Additional Long Term Goals   Additional Long Term Goals Yes      PT LONG TERM GOAL #6   Title Pt. will complete 5xSTS in at least 12 seconds to improve strength to improve functional mobility.     Baseline 9/13: 15.51s    Time 4    Period Weeks    Status  New    Target Date 01/23/20           Pt displays good ability to safely amb outside over unstable surfaces such as walking on grass, inclines, walking up and down inclines, and asc/desc full flight of stairs. Pt required verbal cues for reducing rotation at hips. With taking larger steps backwards walking, pt displyaed intermittent LOB and sway due to unsteadiness. Pt can continue to benefit from skilled PT treatment to improve balance, reduce risk of falls, and improve functional mobility.         Patient will benefit from skilled therapeutic intervention in order to improve the following deficits and impairments:  Abnormal gait, Decreased activity tolerance, Decreased balance, Decreased strength, Impaired sensation, Postural dysfunction, Difficulty walking, Dizziness, Decreased endurance  Visit Diagnosis: Imbalance  Other abnormalities of gait and mobility  Muscle weakness (generalized)     Problem List Patient Active Problem List   Diagnosis Date Noted  . Viral gastroenteritis 08/22/2019  . Acute kidney failure ([place]) 02/18/2019  . Anemia in chronic kidney disease 02/18/2019  . Secondary hyperparathyroidism of renal origin ([place]) 02/18/2019  . Right kidney mass 01/20/2019  . Erectile dysfunction due to arterial insufficiency 11/09/2018  . Respiratory illness 11/05/2018  . Tick bite of knee, initial encounter 08/17/2018  . Left shoulder pain 07/02/2018  . Allergic rhinitis 07/02/2018  . Cervical radiculopathy 11/20/2017  . Toe pain, bilateral 11/20/2017  . Abdominal pain 02/18/2017  . Clavicle enlargement 02/18/2017  . Anxiety 02/18/2017  . Degenerative arthritis of knee, bilateral 09/04/2016  . BPH (benign prostatic hyperplasia) 08/18/2016  . Knee osteoarthritis 08/18/2016  . Hyperlipidemia 05/20/2016  . Essential hypertension 05/31/2015  . Low testosterone 02/27/2015  . Diabetic neuropathy ([place])  01/05/2013  . Obesity (BMI 30-39.9) 01/05/2013  . Chronic back pain greater than 3 months duration 01/05/2013  . Gout 01/05/2013  . OSA (obstructive sleep apnea) 01/05/2013  . Chronic kidney disease 01/04/2013  . DM type 2 with diabetic peripheral neuropathy ([place]) 01/04/2013  . Hypothyroidism 01/04/2013   Pura Spice, PT, DPT # [id number] SPT 01/03/2020, 7:43 AM  [facility] [address] [place], [place], [address] Phone: [phone]   Fax:  [phone]  Name: Douglas [name] MRN: [id number] Date of Birth: [birth year]

## 2020-01-04 ENCOUNTER — Ambulatory Visit: Payer: Medicare Other | Admitting: Physical Therapy

## 2020-01-04 ENCOUNTER — Other Ambulatory Visit: Payer: Self-pay

## 2020-01-04 ENCOUNTER — Encounter: Payer: Self-pay | Admitting: Physical Therapy

## 2020-01-04 DIAGNOSIS — R2689 Other abnormalities of gait and mobility: Secondary | ICD-10-CM | POA: Diagnosis not present

## 2020-01-04 DIAGNOSIS — M6281 Muscle weakness (generalized): Secondary | ICD-10-CM | POA: Diagnosis not present

## 2020-01-04 NOTE — Therapy (Signed)
[facility] [address] [place], [place], [address] Phone: [phone]   Fax:  [phone]  Physical Therapy Treatment  Patient Details  Name: Douglas [name] MRN: [id number] Date of Birth: [birth year] Referring Provider (PT): Anthonette Legato, MD   Encounter Date: 01/04/2020   PT End of Session - 01/04/20 1159    Visit Number 9    Number of Visits 17    Date for PT Re-Evaluation 01/23/20    Authorization - Visit Number 4    Authorization - Number of Visits 10    PT Start Time 1104    PT Stop Time 1152    PT Time Calculation (min) 48 min    Equipment Utilized During Treatment Gait belt    Activity Tolerance Patient tolerated treatment well    Behavior During Therapy [facility] for tasks assessed/performed           Past Medical History:  Diagnosis Date  . Arthritis   . Depression   . Diabetes mellitus with complication ([place])   . Diabetic neuropathy ([place])   . Diastolic dysfunction    a. TTE 07/2017: EF 60-65%, mild concentric LVH, no RWMA, Gr1DD, trivial AI, mildly dilated LA, RVSF normal, PASP normal  . Edema    feet/legs  . GERD (gastroesophageal reflux disease)   . Gout   . History of stress test    a. MV 06/2017: small in size, mild in severity, apical anterior and apical defect that was minimally reversible and most likely represented artifact and less likely ischemia/scar, LVEF 55-65%, low risk, probably normal stress test  . Hypertension   . Hypothyroidism   . Kidney cysts    renal failure 2013  . Kidney stones    Dr. Rogers Blocker  . OSA (obstructive sleep apnea)    supplemental oxygen at night  . Oxygen dependent    hs  . Rocky [place] spotted fever 12/10/2017   Positve IgG in titer on 11/20/17  . S/P cardiac cath 1998   a. no obstructive disease  . Syncope and collapse   . Temporary low platelet count (HCC)     Past Surgical History:  Procedure Laterality Date  . ANAL FISSURE REPAIR    . BACK SURGERY  1977    rupture disc lumbar spine  . [facility] with [facility] and Vascular.   Marland Kitchen CATARACT EXTRACTION W/PHACO Right 07/16/2016   Procedure: CATARACT EXTRACTION PHACO AND INTRAOCULAR LENS PLACEMENT (IOC);  Surgeon: [name], MD;  Location: [facility] ORS;  Service: Ophthalmology;  Laterality: Right;  [place] 01:11 AP% 17.6 CDE 24.83 fluid pack lot # [id number]  . CATARACT EXTRACTION W/PHACO Left 08/13/2016   Procedure: CATARACT EXTRACTION PHACO AND INTRAOCULAR LENS PLACEMENT ([place]) suture placed in left eye at end of procedure;  Surgeon: [name], MD;  Location: [facility] ORS;  Service: Ophthalmology;  Laterality: Left;  [place] 01:55 AP% 22.7 CDE 53.56 fluid pack lot # [id number]  . FINGER AMPUTATION     partial  . KNEE SURGERY  1993   arthroscopy  . SHOULDER SURGERY Bilateral 1998   arthroscopic right, rotator cuff repair left    There were no vitals filed for this visit.   Subjective Assessment - 01/04/20 1156    Subjective Pt reports taking time off from HEP like discussed with less soreness in BLE's today. Pt states he is having some pain in one of his toes due to an ingrown toenail but is otherwise not  having pain. No falls.    Pertinent History diabetic neuropathy    How long can you stand comfortably? 10 mins    Patient Stated Goals return to yardwork, walking    Currently in Pain? Other (Comment)    Pain Score 3     Pain Location Foot           There.ex:    Nu-Step L5 for 6 min. UE/LE used. Not billed.    Standing hip abduction B: 2x12, 4 lbs AW's. Mod verbal and tactile cues for form/technique to reduce Lat lean.  Standing hip abduction with green TB at knee: 1x10. Education provided on how to safely perform at home with counter top.   STS with feet on airex pad: 1x10, CGA+1 for safety. Min verbal cues for eccentric control with sitting. Good carryover after cues.  STS with feet on airex pad and x5 ball tosses per stand: x10, ball tosses  variably passed outside BOS. CGA+1  B 6" eccentric step down: attempted side and forward steps with UE support. Pain in knees so discontinued.   B 3" step down: required mod verbal cues to step down with full foot compared to heel only. Reduced pain with step down in knees and reports still challenging to quad musculature bilaterally.     PT Education - 01/04/20 1158    Education Details Form/technique with exercise. How to safely perform resisted hip abduction with resistance band.    Person(s) Educated Patient    Methods Explanation;Demonstration;Tactile cues;Verbal cues    Comprehension Verbalized understanding;Returned demonstration               PT Long Term Goals - 12/26/19 1321      PT LONG TERM GOAL #1   Title Pt will increase FOTO score to 53 to display perceived improvements in functional mobility.    Baseline 8/9: 38; 9/13:43    Time 4    Period Weeks    Status Partially Met    Target Date 01/23/20      PT LONG TERM GOAL #2   Title Pt. will report no dizziness/imbalance/fatigue after approx 10 mins of yardwork to improve ability to complete ADLs.    Baseline initial: pt feels dizziness/imbalance/fatigue after 10 mins of yardwork; 9/13: pt states that he feels he is able to complete more than 10 mins    Time 4    Period Weeks    Status Achieved    Target Date 12/26/19      PT LONG TERM GOAL #3   Title Pt will improve Berg balance to 52/56 to decrease falls risk.    Baseline initial: 47/56; 9/13: 54/56    Time 4    Period Weeks    Status Achieved    Target Date 12/26/19      PT LONG TERM GOAL #4   Title Pt will improve bilat hip flexion strength to 5/5 to to improve functional mobility.    Baseline initial: R hip flex: 4+/5, L hip flex: 4-/5; 9/13: R:4/5, L:4/5    Time 4    Period Weeks    Status Partially Met    Target Date 01/23/20      PT LONG TERM GOAL #5   Title Pt. will score at least 22/24 on DGI to decrease falls risk during ambulation.     Baseline 9/13: 17/24    Time 4    Period Weeks    Status New    Target Date 01/23/20  Additional Long Term Goals   Additional Long Term Goals Yes      PT LONG TERM GOAL #6   Title Pt. will complete 5xSTS in at least 12 seconds to improve strength to improve functional mobility.    Baseline 9/13: 15.51s    Time 4    Period Weeks    Status New    Target Date 01/23/20                 Plan - 01/04/20 1159    Clinical Impression Statement Pt progressing in strength. Pt displayed improved ability to perform resisted hip abduction with 4 lbs AW's bilaterally where previously pt could not tolerate resistance abduction with RLE. Pt however still require dmod intermittent verbal and tactile cues. Pt displaying most difficulty with eccentric step downs on 6" step and even with 3" step due to knee pain and eccentric quad weakness. Modified to bring contralateral foot to ground instead of just the heel with improvment in ability to perform with less pain. Pt can continue to benefit from skilled PT to redcue risk of falls, improve BLE strength for improved functional mobility.    Stability/Clinical Decision Making Evolving/Moderate complexity    Clinical Decision Making Moderate    Rehab Potential Good    PT Frequency 2x / week    PT Duration 4 weeks    PT Treatment/Interventions ADLs/Self Care Home Management;Electrical Stimulation;Gait training;Stair training;Functional mobility training;Therapeutic activities;Therapeutic exercise;Balance training;Neuromuscular re-education;Patient/family education    PT Next Visit Plan Eccentric step downs SLS    PT Home Exercise Plan [id number]    Consulted and Agree with Plan of Care Patient           Patient will benefit from skilled therapeutic intervention in order to improve the following deficits and impairments:  Abnormal gait, Decreased activity tolerance, Decreased balance, Decreased strength, Impaired sensation, Postural dysfunction,  Difficulty walking, Dizziness, Decreased endurance  Visit Diagnosis: Imbalance  Other abnormalities of gait and mobility  Muscle weakness (generalized)     Problem List Patient Active Problem List   Diagnosis Date Noted  . Viral gastroenteritis 08/22/2019  . Acute kidney failure ([place]) 02/18/2019  . Anemia in chronic kidney disease 02/18/2019  . Secondary hyperparathyroidism of renal origin ([place]) 02/18/2019  . Right kidney mass 01/20/2019  . Erectile dysfunction due to arterial insufficiency 11/09/2018  . Respiratory illness 11/05/2018  . Tick bite of knee, initial encounter 08/17/2018  . Left shoulder pain 07/02/2018  . Allergic rhinitis 07/02/2018  . Cervical radiculopathy 11/20/2017  . Toe pain, bilateral 11/20/2017  . Abdominal pain 02/18/2017  . Clavicle enlargement 02/18/2017  . Anxiety 02/18/2017  . Degenerative arthritis of knee, bilateral 09/04/2016  . BPH (benign prostatic hyperplasia) 08/18/2016  . Knee osteoarthritis 08/18/2016  . Hyperlipidemia 05/20/2016  . Essential hypertension 05/31/2015  . Low testosterone 02/27/2015  . Diabetic neuropathy ([place]) 01/05/2013  . Obesity (BMI 30-39.9) 01/05/2013  . Chronic back pain greater than 3 months duration 01/05/2013  . Gout 01/05/2013  . OSA (obstructive sleep apnea) 01/05/2013  . Chronic kidney disease 01/04/2013  . DM type 2 with diabetic peripheral neuropathy ([place]) 01/04/2013  . Hypothyroidism 01/04/2013   Pura Spice, PT, DPT # [id number], SPT 01/04/2020, 1:59 PM  [place] [facility] [address] [place], [place], [address] Phone: [phone]   Fax:  [phone]  Name: Douglas [name] MRN: [id number] Date of Birth: [birth year]

## 2020-01-05 ENCOUNTER — Telehealth: Payer: Medicare Other

## 2020-01-08 ENCOUNTER — Other Ambulatory Visit: Payer: Self-pay | Admitting: Family Medicine

## 2020-01-08 DIAGNOSIS — E1142 Type 2 diabetes mellitus with diabetic polyneuropathy: Secondary | ICD-10-CM

## 2020-01-09 ENCOUNTER — Encounter: Payer: Self-pay | Admitting: Physical Therapy

## 2020-01-09 ENCOUNTER — Ambulatory Visit: Payer: Medicare Other | Admitting: Physical Therapy

## 2020-01-09 ENCOUNTER — Other Ambulatory Visit: Payer: Self-pay

## 2020-01-09 DIAGNOSIS — R2689 Other abnormalities of gait and mobility: Secondary | ICD-10-CM | POA: Diagnosis not present

## 2020-01-09 DIAGNOSIS — M6281 Muscle weakness (generalized): Secondary | ICD-10-CM

## 2020-01-09 NOTE — Therapy (Signed)
[place] [facility] [address] [place], [place], [address] Phone: [phone]   Fax:  [phone]  Physical Therapy Treatment  Patient Details  Name: Douglas [name] MRN: [id number] Date of Birth: Jun 15, 1945 Referring Provider (PT): Anthonette Legato, MD   Encounter Date: 01/09/2020   PT End of Session - 01/09/20 1348    Visit Number 10    Number of Visits 17    Date for PT Re-Evaluation 01/23/20    Authorization - Visit Number 5    Authorization - Number of Visits 10    PT Start Time 3570    PT Stop Time 1341    PT Time Calculation (min) 38 min    Equipment Utilized During Treatment Gait belt    Activity Tolerance Patient tolerated treatment well    Behavior During Therapy [facility] for tasks assessed/performed           Past Medical History:  Diagnosis Date  . Arthritis   . Depression   . Diabetes mellitus with complication ([place])   . Diabetic neuropathy ([place])   . Diastolic dysfunction    a. TTE 07/2017: EF 60-65%, mild concentric LVH, no RWMA, Gr1DD, trivial AI, mildly dilated LA, RVSF normal, PASP normal  . Edema    feet/legs  . GERD (gastroesophageal reflux disease)   . Gout   . History of stress test    a. MV 06/2017: small in size, mild in severity, apical anterior and apical defect that was minimally reversible and most likely represented artifact and less likely ischemia/scar, LVEF 55-65%, low risk, probably normal stress test  . Hypertension   . Hypothyroidism   . Kidney cysts    renal failure 2013  . Kidney stones    Dr. Rogers Blocker  . OSA (obstructive sleep apnea)    supplemental oxygen at night  . Oxygen dependent    hs  . Rocky Mountain spotted fever 12/10/2017   Positve IgG in titer on 11/20/17  . S/P cardiac cath 1998   a. no obstructive disease  . Syncope and collapse   . Temporary low platelet count (HCC)     Past Surgical History:  Procedure Laterality Date  . ANAL FISSURE REPAIR    . BACK SURGERY  1977    rupture disc lumbar spine  . [facility] with [facility] and Vascular.   Marland Kitchen CATARACT EXTRACTION W/PHACO Right 07/16/2016   Procedure: CATARACT EXTRACTION PHACO AND INTRAOCULAR LENS PLACEMENT (IOC);  Surgeon: [name], MD;  Location: [facility] ORS;  Service: Ophthalmology;  Laterality: Right;  [place] 01:11 AP% 17.6 CDE 24.83 fluid pack lot # [id number]  . CATARACT EXTRACTION W/PHACO Left 08/13/2016   Procedure: CATARACT EXTRACTION PHACO AND INTRAOCULAR LENS PLACEMENT ([place]) suture placed in left eye at end of procedure;  Surgeon: [name], MD;  Location: [facility] ORS;  Service: Ophthalmology;  Laterality: Left;  [place] 01:55 AP% 22.7 CDE 53.56 fluid pack lot # [id number]  . FINGER AMPUTATION     partial  . KNEE SURGERY  1993   arthroscopy  . SHOULDER SURGERY Bilateral 1998   arthroscopic right, rotator cuff repair left    There were no vitals filed for this visit.   Subjective Assessment - 01/09/20 1345    Subjective Pt reports B knee pain with CKC activities like stairs and walking on uneven surfaces since this past wednesday. With these activities pain is reported at 7-8/10 pain via NPS but no pain at rest or  walking. No falls reported.    Pertinent History diabetic neuropathy    How long can you stand comfortably? 10 mins    Patient Stated Goals return to yardwork, walking    Currently in Pain? No/denies    Pain Score 0-No pain    Multiple Pain Sites No          There.ex:   4 way resisted hip with 5 lbs AW's: No UE support for marching. CGA for marches. Mod verbal and tactile cues for form/technique to reduce lat lean.    Neuro Re-Ed:   SLS: B, 3x30 sec. Multiple attempts to get to 30 sec with ability to indep correct sway. CGA+1   Side steps on foam with alt cone taps (x4 cones): x8 in // bars. No UE support and CGA+1. No LOB.   Ambulating outside over curbs and uneven surfaces like grass. Educated on raising B feet on grass to improve  foot clearance. Verbal cues for stepping down off of curb to reduce knee pain.   PT Education - 01/09/20 1347    Education Details Form/technique with exercise.    Person(s) Educated Patient    Methods Explanation;Demonstration;Tactile cues;Verbal cues    Comprehension Verbalized understanding;Returned demonstration               PT Long Term Goals - 12/26/19 1321      PT LONG TERM GOAL #1   Title Pt will increase FOTO score to 53 to display perceived improvements in functional mobility.    Baseline 8/9: 38; 9/13:43    Time 4    Period Weeks    Status Partially Met    Target Date 01/23/20      PT LONG TERM GOAL #2   Title Pt. will report no dizziness/imbalance/fatigue after approx 10 mins of yardwork to improve ability to complete ADLs.    Baseline initial: pt feels dizziness/imbalance/fatigue after 10 mins of yardwork; 9/13: pt states that he feels he is able to complete more than 10 mins    Time 4    Period Weeks    Status Achieved    Target Date 12/26/19      PT LONG TERM GOAL #3   Title Pt will improve Berg balance to 52/56 to decrease falls risk.    Baseline initial: 47/56; 9/13: 54/56    Time 4    Period Weeks    Status Achieved    Target Date 12/26/19      PT LONG TERM GOAL #4   Title Pt will improve bilat hip flexion strength to 5/5 to to improve functional mobility.    Baseline initial: R hip flex: 4+/5, L hip flex: 4-/5; 9/13: R:4/5, L:4/5    Time 4    Period Weeks    Status Partially Met    Target Date 01/23/20      PT LONG TERM GOAL #5   Title Pt. will score at least 22/24 on DGI to decrease falls risk during ambulation.    Baseline 9/13: 17/24    Time 4    Period Weeks    Status New    Target Date 01/23/20      Additional Long Term Goals   Additional Long Term Goals Yes      PT LONG TERM GOAL #6   Title Pt. will complete 5xSTS in at least 12 seconds to improve strength to improve functional mobility.    Baseline 9/13: 15.51s    Time 4     Period Weeks      Status New    Target Date 01/23/20                 Plan - 01/09/20 1441    Clinical Impression Statement Pt limited in CKC strengthening due to episode of acute B knee pain from previous PT session. Pt progressed to dynamic side steps on foam with alternating cone tapes requiring no UE assist in // bars and is now able to maintian B SLS for 30 sec with no UE support but did require multiple attempts with intermittent sway that he could correct independently. Pt can continue to benefit from skilled PT to improve functional mobility and reducing risk of falls.    Stability/Clinical Decision Making Evolving/Moderate complexity    Clinical Decision Making Moderate    Rehab Potential Good    PT Frequency 2x / week    PT Duration 4 weeks    PT Treatment/Interventions ADLs/Self Care Home Management;Electrical Stimulation;Gait training;Stair training;Functional mobility training;Therapeutic activities;Therapeutic exercise;Balance training;Neuromuscular re-education;Patient/family education    PT Next Visit Plan Eccentric step downs SLS    PT Home Exercise Plan [id number]    Consulted and Agree with Plan of Care Patient           Patient will benefit from skilled therapeutic intervention in order to improve the following deficits and impairments:  Abnormal gait, Decreased activity tolerance, Decreased balance, Decreased strength, Impaired sensation, Postural dysfunction, Difficulty walking, Dizziness, Decreased endurance  Visit Diagnosis: Imbalance  Other abnormalities of gait and mobility  Muscle weakness (generalized)     Problem List Patient Active Problem List   Diagnosis Date Noted  . Viral gastroenteritis 08/22/2019  . Acute kidney failure ([place]) 02/18/2019  . Anemia in chronic kidney disease 02/18/2019  . Secondary hyperparathyroidism of renal origin ([place]) 02/18/2019  . Right kidney mass 01/20/2019  . Erectile dysfunction due to arterial insufficiency  11/09/2018  . Respiratory illness 11/05/2018  . Tick bite of knee, initial encounter 08/17/2018  . Left shoulder pain 07/02/2018  . Allergic rhinitis 07/02/2018  . Cervical radiculopathy 11/20/2017  . Toe pain, bilateral 11/20/2017  . Abdominal pain 02/18/2017  . Clavicle enlargement 02/18/2017  . Anxiety 02/18/2017  . Degenerative arthritis of knee, bilateral 09/04/2016  . BPH (benign prostatic hyperplasia) 08/18/2016  . Knee osteoarthritis 08/18/2016  . Hyperlipidemia 05/20/2016  . Essential hypertension 05/31/2015  . Low testosterone 02/27/2015  . Diabetic neuropathy ([place]) 01/05/2013  . Obesity (BMI 30-39.9) 01/05/2013  . Chronic back pain greater than 3 months duration 01/05/2013  . Gout 01/05/2013  . OSA (obstructive sleep apnea) 01/05/2013  . Chronic kidney disease 01/04/2013  . DM type 2 with diabetic peripheral neuropathy ([place]) 01/04/2013  . Hypothyroidism 01/04/2013    Larna Daughters, SPT 01/09/2020, 2:48 PM  [place] [facility] [address]. [place], [place], [address] Phone: [phone]   Fax:  [phone]  Name: ELDIN BONSELL MRN: [id number] Date of Birth: [birth year]

## 2020-01-11 ENCOUNTER — Ambulatory Visit (INDEPENDENT_AMBULATORY_CARE_PROVIDER_SITE_OTHER): Payer: Medicare Other | Admitting: Pharmacist

## 2020-01-11 ENCOUNTER — Other Ambulatory Visit: Payer: Self-pay

## 2020-01-11 ENCOUNTER — Ambulatory Visit: Payer: Medicare Other | Admitting: Physical Therapy

## 2020-01-11 ENCOUNTER — Encounter: Payer: Self-pay | Admitting: Physical Therapy

## 2020-01-11 DIAGNOSIS — I10 Essential (primary) hypertension: Secondary | ICD-10-CM

## 2020-01-11 DIAGNOSIS — M1A3790 Chronic gout due to renal impairment, unspecified ankle and foot, without tophus (tophi): Secondary | ICD-10-CM

## 2020-01-11 DIAGNOSIS — R2689 Other abnormalities of gait and mobility: Secondary | ICD-10-CM | POA: Diagnosis not present

## 2020-01-11 DIAGNOSIS — E1142 Type 2 diabetes mellitus with diabetic polyneuropathy: Secondary | ICD-10-CM

## 2020-01-11 DIAGNOSIS — N1831 Chronic kidney disease, stage 3a: Secondary | ICD-10-CM

## 2020-01-11 DIAGNOSIS — M6281 Muscle weakness (generalized): Secondary | ICD-10-CM

## 2020-01-11 MED ORDER — LOSARTAN POTASSIUM 25 MG PO TABS: ORAL_TABLET | ORAL | 3 refills | Status: DC

## 2020-01-11 MED ORDER — TRESIBA FLEXTOUCH 100 UNIT/ML ~~LOC~~ SOPN: 48.0000 [IU] | PEN_INJECTOR | Freq: Every day | SUBCUTANEOUS | 0 refills | Status: DC

## 2020-01-11 NOTE — Patient Instructions (Addendum)
[name],   It was great talking with you today!  Our goal A1c is less than 7%. This corresponds with fasting sugars less than 130 and 2 hour after meal sugars less than 180.   Please call me or Dr. Ellen Henri office in 1-2 weeks to let us know how your blood pressures are running, once the losartan is back at a therapeutic level in your body.  My pharmacy technician will mail you the pages of the application we need you to complete. We need - Signed application - Proof of income - please submit a copy of your 2020 tax return AND your social security statements + your wife's retirement statement - Copy of your prescription insurance card  Feel free to call me with any questions or concerns. I look forward to our next visit!  Catie Darnelle Maffucci, PharmD, [place], CPP Direct line: [phone] Clinic phone: [phone]   Visit Information  Goals Addressed              This Visit's Progress     Patient Stated   .  PharmD "I need help affording my medications" (pt-stated)        CARE PLAN ENTRY (see longitudinal plan of care for additional care plan information)  Current Barriers:  . Social, financial, community barriers:  o Knee injections - notes he needs to get back in w/ ortho, continuation of tx was limited by pandemic restrictions o Notes that he is in [facility] Coverage Gap, having difficulty affording medications . Diabetes: uncontrolled; complicated by chronic medical conditions including CKD, HTN, HLD, hypogonadism, most recent A1c 7.8% . Most recent eGFR: 47 mL/min . Current antihyperglycemic regimen: Lantus 48 units daily, metformin XR 500 mg QAM, 1000 mg QPM, Jardiance 25 mg daily, Ozempic ordered by PCP, but patient unable to afford . Current blood glucose readings:  o Fasting: 140s-150s o Not checking post prandials: not checking any right now . Cardiovascular risk reduction: o Current hypertensive regimen: losartan 25 mg QAM, 50 mg QPM (per patient report,  script does not reflect this), doxazosin 2 mg QPM, furosemide 40 mg QAM (reports edema); reports hx difficult-to-control BP. Notes dizziness w/ higher dose of doxazosin. Hx hypokalemia w/ HCTZ. No hx spironolactone - Reports he has been without losartan for 3 days. SBP yesterday and today in 160-170s. Denies chest pain, notes a mild headache o Current hyperlipidemia regimen: simvastatin 40 mg QPM, last LDL well controlled at 40 o Current antiplatelet regimen: ASA 81 mg daily . Eye exam: due  . Nephropathy screening: up to date (nephrology) . Peripheral neuropathy: participating in PT for mobility support; duloxetine 60 mg daily, pregabalin 400 mg BID, hydocodone-APAP 5/325 mg PRN (notes he has only used 1 tab since Feb); topical clove oil and Salonpas also helping w/ feet . CKD: calcitriol 0.25 mg daily; follows w/ Dr. Holley Raring . BPH: finasteride 5 mg dialy . Gout: allopurinol 300 mg daily; last uric acid at goal <7 . Allergies: loratadine 10 mg PRN, diphenhydramine 25 mg PRN though often, and fluticasone 50 mg intranasal. Notes that he gets "crusty eyes" because of the pregabalin . Referral to endocrinology for hypogonadism. Appt w/ Dr. Honor Junes 01/19/20  Pharmacist Clinical Goal(s):  Marland Kitchen Over the next 90 days, patient will work with PharmD and primary care provider to address optimized medication management  Interventions: . Comprehensive medication review performed, medication list updated in electronic medical record . Inter-disciplinary care team collaboration (see longitudinal plan of care) . Reviewed income. Patient meets income criteria for  manufacturer assistance programs. Will switch Lantus to [place] to allow for 1 application to [employer] for basal insulin and GLP1. Jardiance app to [facility]. Will collaborate w/ to mail patient portion of application to him; I will collaborate w/ PCP for provider portion.  . Sent refill for losartan with patient reported increased dose. Counseled that if he  develops chest pain or headache worsens, to seek immediate medical attention. . Moving forward, will collaborate w/ PCP for management of HTN . Discussed anticholinergic side effects of diphenhydramine. Advised to use second generation antihistamine such as loratadine daily for allergic symptoms rather than diphenhydramine. He verbalizes understanding. . Educated on goal A1c, goal fasting, and goal 2 hour post prandial. Encouraged to start checking 2 hour post prandial readings. Patient verbalized understanding. Goal to reduce insulin burden to reduce risk of hypoglycemia, reduced fall risk  Patient Self Care Activities:  . Patient will check blood glucose BID, document, and provide at future appointments . Patient will take medications as prescribed . Patient will contact provider with any episodes of hypoglycemia . Patient will report any questions or concerns to provider   Initial goal documentation        Douglas [name] was given information about Chronic Care Management services today including:  1. CCM service includes personalized support from designated clinical staff supervised by his physician, including individualized plan of care and coordination with other care providers 2. 24/7 contact phone numbers for assistance for urgent and routine care needs. 3. Service will only be billed when office clinical staff spend 20 minutes or more in a month to coordinate care. 4. Only one practitioner may furnish and bill the service in a calendar month. 5. The patient may stop CCM services at any time (effective at the end of the month) by phone call to the office staff. 6. The patient will be responsible for cost sharing (co-pay) of up to 20% of the service fee (after annual deductible is met).  Patient agreed to services and verbal consent obtained.   The patient verbalized understanding of instructions provided today and agreed to receive a mailed copy of patient instruction and/or  educational materials.  Plan: - Scheduled f/u call in ~ 5 weeks  Catie Darnelle Maffucci, PharmD, [place], [place] Pharmacist [place] [phone]

## 2020-01-11 NOTE — Chronic Care Management (AMB) (Signed)
Chronic Care Management   Note  01/11/2020 Name: Douglas [name] MRN: [id number] DOB: 1945/08/31   Subjective:  [name] Thrall is a 74 y.o. year old male who is a primary care patient of [name], [name], MD. The CCM team was consulted for assistance with chronic disease management and care coordination needs.     Contacted patient for initial medication access and medication management support  Mr. Hartwig was given information about Chronic Care Management services today including:  1. CCM service includes personalized support from designated clinical staff supervised by his physician, including individualized plan of care and coordination with other care providers 2. 24/7 contact phone numbers for assistance for urgent and routine care needs. 3. Service will only be billed when office clinical staff spend 20 minutes or more in a month to coordinate care. 4. Only one practitioner may furnish and bill the service in a calendar month. 5. The patient may stop CCM services at any time (effective at the end of the month) by phone call to the office staff. 6. The patient will be responsible for cost sharing (co-pay) of up to 20% of the service fee (after annual deductible is met).  Patient agreed to services and verbal consent obtained.   Review of patient status, including review of consultants reports, laboratory and other test data, was performed as part of comprehensive evaluation and provision of chronic care management services.   SDOH (Social Determinants of Health) assessments and interventions performed:  SDOH Interventions     Most Recent Value  SDOH Interventions  Financial Strain Interventions Other (Comment)  [manufacturer assistance]       Objective:  Lab Results  Component Value Date   CREATININE 1.45 06/10/2019   CREATININE 1.49 (H) 01/19/2019   CREATININE 1.54 (H) 11/12/2018    Lab Results  Component Value Date   HGBA1C 7.8 (H) 11/23/2019         Component Value Date/Time   CHOL 115 11/23/2019 1350   CHOL 142 11/12/2018 1142   TRIG 174.0 (H) 11/23/2019 1350   HDL 40.80 11/23/2019 1350   HDL 49 11/12/2018 1142   CHOLHDL 3 11/23/2019 1350   VLDL 34.8 11/23/2019 1350   LDLCALC 40 11/23/2019 1350   LDLCALC 58 11/12/2018 1142    Clinical ASCVD: No  The ASCVD Risk score Mikey Bussing DC Jr., et al., 2013) failed to calculate for the following reasons:   The valid total cholesterol range is 130 to 320 mg/dL    BP Readings from Last 3 Encounters:  11/23/19 140/80  09/07/19 140/80  09/05/19 138/78    Allergies  Allergen Reactions  . Bee Venom Swelling  . Dye Fdc Red [Red Dye] Swelling and Other (See Comments)    Reaction: gout  . Atenolol Other (See Comments)    Did not regulate blood pressure  . Benadryl [Diphenhydramine Hcl (Sleep)] Other (See Comments)    Hyperactivity   . Enalapril Itching    itching  . Gabapentin Palpitations and Rash    rash  . Hctz [Hydrochlorothiazide] Other (See Comments)    Decreased potassium    Medications Reviewed Today    Reviewed by De Hollingshead, RPH-CPP (Pharmacist) on 01/11/20 at 1113  Med List Status: <None>  Medication Order Taking? Sig Documenting Provider Last Dose Status Informant  allopurinol (ZYLOPRIM) 300 MG tablet [id number] Yes TAKE 1 TABLET(300 MG) BY MOUTH DAILY [name], MD Taking Active   aspirin 81 MG tablet [id number] Yes Take 1 tablet (81 mg total)  by mouth daily. Wellington Hampshire, MD Taking Active Self  BD PEN NEEDLE NANO 2ND GEN 32G X 4 MM MISC [id number] Yes USE DAILY [name], MD Taking Active   Blood Glucose Monitoring Suppl (FREESTYLE FREEDOM LITE) W/DEVICE KIT [id number] Yes Use as directed Jackolyn Confer, MD Taking Active Self  calcitRIOL (ROCALTROL) 0.25 MCG capsule [id number] Yes Take 0.25 mcg by mouth daily.  [provider] Taking Active Self  clove oil liquid [id number] Yes Apply 1 application topically as needed. [provider] Taking Active Self  doxazosin (CARDURA) 4 MG tablet [id number] Yes Take 0.5 tablets (2 mg total) by mouth daily. [name], MD Taking Active   DULoxetine (CYMBALTA) 60 MG capsule [id number] Yes TAKE 1 CAPSULE(60 MG) BY MOUTH DAILY [name], MD Taking Active   empagliflozin (JARDIANCE) 25 MG TABS tablet [id number] Yes Take 25 mg by mouth daily before breakfast. [name], MD Taking Active   esomeprazole (NEXIUM) 40 MG capsule [id number] Yes Take 1 capsule (40 mg total) by mouth daily at 12 noon.  Patient taking differently: Take 40 mg by mouth daily.    Jackolyn Confer, MD Taking Active Self  finasteride (PROSCAR) 5 MG tablet [id number] Yes Take 1 tablet (5 mg total) by mouth at bedtime. Abbie Sons, MD Taking Active   fluticasone (FLONASE) 50 MCG/ACT nasal spray [id number] Yes Place 2 sprays into both nostrils daily. [name], MD Taking Active   furosemide (LASIX) 40 MG tablet [id number] Yes TAKE 1 TABLET(40 MG) BY MOUTH DAILY Einar Pheasant, MD Taking Active   glucose blood (FREESTYLE LITE) test strip [id number] Yes CHECK BLOOD SUGAR TWICE DAILY. [name], MD Taking Active   HYDROcodone-acetaminophen (NORCO/VICODIN) 5-325 MG tablet [id number] Yes Take 1 tablet by mouth every 6 (six) hours as needed for moderate pain. [name], MD Taking Active   Insulin Glargine (LANTUS SOLOSTAR) 100 UNIT/ML Solostar Pen [id number] Yes INJECT 48 UNITS UNDER THE SKIN EVERY DAY [name], MD Taking Active   levothyroxine (SYNTHROID) 100 MCG tablet [id number] Yes TAKE 1 TABLET(100 MCG) BY MOUTH DAILY BEFORE BREAKFAST [name], MD Taking Active   loratadine (CLARITIN) 10 MG tablet [id number] Yes Take 10 mg by mouth daily as needed for allergies.  [provider] Taking Active Self  losartan (COZAAR) 25 MG tablet [id number] Yes TAKE 1 TABLET(25 MG) BY MOUTH TWICE DAILY [name], MD Taking Active            Med Note  Darnelle Maffucci, Arville Lime   Wed Jan 11, 2020 11:08 AM) Taking 25 mg QAM, 50 mg QPM  Magnesium 250 MG TABS [id number] Yes Take 1 tablet by mouth daily.  [provider] Taking Active Self  metFORMIN (GLUCOPHAGE-XR) 500 MG 24 hr tablet [id number] Yes Take 1 tablet (500 mg) by mouth in the morning, take 2 tablets (1000 mg) by mouth in the evening [name], MD Taking Active   pregabalin (LYRICA) 100 MG capsule [id number] Yes Take 2 capsules (200 mg total) by mouth 2 (two) times daily. [name], MD Taking Active   Semaglutide,0.25 or 0.5MG/DOS, (OZEMPIC, 0.25 OR 0.5 MG/DOSE,) 2 MG/1.5ML SOPN [id number] No Inject 0.1875 mLs (0.25 mg total) into the skin once a week for 28 days, THEN 0.375 mLs (0.5 mg total) once a week.  Patient not taking: Reported on 01/11/2020   [name], MD Not Taking Active   simvastatin (ZOCOR) 40 MG tablet [id number] Yes  TAKE 1 TABLET(40 MG) BY MOUTH AT BEDTIME [name], MD Taking Active            Assessment:   Goals Addressed              This Visit's Progress     Patient Stated   .  PharmD "I need help affording my medications" (pt-stated)        CARE PLAN ENTRY (see longitudinal plan of care for additional care plan information)  Current Barriers:  . Social, financial, community barriers:  o Knee injections - notes he needs to get back in w/ ortho, continuation of tx was limited by pandemic restrictions o Notes that he is in [facility] Coverage Gap, having difficulty affording medications . Diabetes: uncontrolled; complicated by chronic medical conditions including CKD, HTN, HLD, hypogonadism, most recent A1c 7.8% . Most recent eGFR: 47 mL/min . Current antihyperglycemic regimen: Lantus 48 units daily, metformin XR 500 mg QAM, 1000 mg QPM, Jardiance 25 mg daily, Ozempic ordered by PCP, but patient unable to afford . Current blood glucose readings:  o Fasting: 140s-150s o Not checking post prandials: not checking any right  now . Cardiovascular risk reduction: o Current hypertensive regimen: losartan 25 mg QAM, 50 mg QPM (per patient report, script does not reflect this), doxazosin 2 mg QPM, furosemide 40 mg QAM (reports edema); reports hx difficult-to-control BP. Notes dizziness w/ higher dose of doxazosin. Hx hypokalemia w/ HCTZ. No hx spironolactone - Reports he has been without losartan for 3 days. SBP yesterday and today in 160-170s. Denies chest pain, notes a mild headache o Current hyperlipidemia regimen: simvastatin 40 mg QPM, last LDL well controlled at 40 o Current antiplatelet regimen: ASA 81 mg daily . Eye exam: due  . Nephropathy screening: up to date (nephrology) . Peripheral neuropathy: participating in PT for mobility support; duloxetine 60 mg daily, pregabalin 400 mg BID, hydocodone-APAP 5/325 mg PRN (notes he has only used 1 tab since Feb); topical clove oil and Salonpas also helping w/ feet . CKD: calcitriol 0.25 mg daily; follows w/ Dr. Holley Raring . BPH: finasteride 5 mg dialy . Gout: allopurinol 300 mg daily; last uric acid at goal <7 . Allergies: loratadine 10 mg PRN, diphenhydramine 25 mg PRN though often, and fluticasone 50 mg intranasal. Notes that he gets "crusty eyes" because of the pregabalin . Referral to endocrinology for hypogonadism. Appt w/ Dr. Honor Junes 01/19/20  Pharmacist Clinical Goal(s):  Marland Kitchen Over the next 90 days, patient will work with PharmD and primary care provider to address optimized medication management  Interventions: . Comprehensive medication review performed, medication list updated in electronic medical record . Inter-disciplinary care team collaboration (see longitudinal plan of care) . Reviewed income. Patient meets income criteria for manufacturer assistance programs. Will switch Lantus to [place] to allow for 1 application to [employer] for basal insulin and GLP1. Jardiance app to [facility]. Will collaborate w/ to mail patient portion of application to him; I will  collaborate w/ PCP for provider portion.  . Sent refill for losartan with patient reported increased dose. Counseled that if he develops chest pain or headache worsens, to seek immediate medical attention. . Moving forward, will collaborate w/ PCP for management of HTN . Discussed anticholinergic side effects of diphenhydramine. Advised to use second generation antihistamine such as loratadine daily for allergic symptoms rather than diphenhydramine. He verbalizes understanding. . Educated on goal A1c, goal fasting, and goal 2 hour post prandial. Encouraged to start checking 2 hour post prandial readings.  Patient verbalized understanding. Goal to reduce insulin burden to reduce risk of hypoglycemia, reduced fall risk  Patient Self Care Activities:  . Patient will check blood glucose BID, document, and provide at future appointments . Patient will take medications as prescribed . Patient will contact provider with any episodes of hypoglycemia . Patient will report any questions or concerns to provider   Initial goal documentation        Plan: - Scheduled f/u call in ~ 5 weeks  Catie Darnelle Maffucci, PharmD, [place], [place] Pharmacist [place] [phone]

## 2020-01-11 NOTE — Therapy (Signed)
[facility] [address] [place], [place], [address] Phone: [phone]   Fax:  [phone]  Physical Therapy Treatment  Patient Details  Name: Douglas [name] MRN: [id number] Date of Birth: 1946-01-05 Referring Provider (PT): Anthonette Legato, MD   Encounter Date: 01/11/2020   PT End of Session - 01/11/20 1436    Visit Number 11    Number of Visits 17    Date for PT Re-Evaluation 01/23/20    Authorization - Visit Number 6    Authorization - Number of Visits 10    PT Start Time 1250    PT Stop Time 1346    PT Time Calculation (min) 56 min    Equipment Utilized During Treatment Gait belt    Activity Tolerance Patient tolerated treatment well    Behavior During Therapy [facility] for tasks assessed/performed           Past Medical History:  Diagnosis Date  . Arthritis   . Depression   . Diabetes mellitus with complication ([place])   . Diabetic neuropathy ([place])   . Diastolic dysfunction    a. TTE 07/2017: EF 60-65%, mild concentric LVH, no RWMA, Gr1DD, trivial AI, mildly dilated LA, RVSF normal, PASP normal  . Edema    feet/legs  . GERD (gastroesophageal reflux disease)   . Gout   . History of stress test    a. MV 06/2017: small in size, mild in severity, apical anterior and apical defect that was minimally reversible and most likely represented artifact and less likely ischemia/scar, LVEF 55-65%, low risk, probably normal stress test  . Hypertension   . Hypothyroidism   . Kidney cysts    renal failure 2013  . Kidney stones    Dr. Rogers Blocker  . OSA (obstructive sleep apnea)    supplemental oxygen at night  . Oxygen dependent    hs  . Rocky Mountain spotted fever 12/10/2017   Positve IgG in titer on 11/20/17  . S/P cardiac cath 1998   a. no obstructive disease  . Syncope and collapse   . Temporary low platelet count (HCC)     Past Surgical History:  Procedure Laterality Date  . ANAL FISSURE REPAIR    . BACK SURGERY  1977    rupture disc lumbar spine  . [facility] with [facility].   Marland Kitchen CATARACT EXTRACTION W/PHACO Right 07/16/2016   Procedure: CATARACT EXTRACTION PHACO AND INTRAOCULAR LENS PLACEMENT (IOC);  Surgeon: [name], MD;  Location: [facility] ORS;  Service: Ophthalmology;  Laterality: Right;  [place] 01:11 AP% 17.6 CDE 24.83 fluid pack lot # [id number]  . CATARACT EXTRACTION W/PHACO Left 08/13/2016   Procedure: CATARACT EXTRACTION PHACO AND INTRAOCULAR LENS PLACEMENT ([place]) suture placed in left eye at end of procedure;  Surgeon: [name], MD;  Location: [facility] ORS;  Service: Ophthalmology;  Laterality: Left;  [place] 01:55 AP% 22.7 CDE 53.56 fluid pack lot # [id number]  . FINGER AMPUTATION     partial  . KNEE SURGERY  1993   arthroscopy  . SHOULDER SURGERY Bilateral 1998   arthroscopic right, rotator cuff repair left    There were no vitals filed for this visit.   Subjective Assessment - 01/11/20 1435    Subjective Pt states his knee pain has resolved prior to treatment. No falls.    Pertinent History diabetic neuropathy    How long can you stand comfortably? 10 mins    Patient Stated Goals  return to yardwork, walking    Currently in Pain? No/denies    Pain Score 0-No pain           There.ex:   Nu-Step L6 for 8 min. UE/LE use. Not billed   Neuro Re-Ed:   Standing on bosu ball: 3x30 sec. ModA+1 to correct for LOB in // bars. CGA+1 throughout besides bouts of LOB   B lunges onto bosu ball for ankle stability and LE strength: 2x10. Mod verbal and tactile cues for form/technique. BUE support  Alternating stepping over small and large hurdles with AW's: 5 lbs, x8 CGA+1. Difficulty stepping over larger hurdles. Intermittent single UE support.   STS with airex pad under feet: 2x10, CGA+1. MinA+1 to correct ant lean   STS with airex under feet holding med sized weighted med ball: 2x10. CGA+1  Resisted walking at nautilus: backwards, 40 lbs x8.  CGA+1   PT Education - 01/11/20 1435    Education Details Form/technique with exercise    Person(s) Educated Patient    Methods Explanation;Demonstration;Tactile cues;Verbal cues    Comprehension Verbalized understanding;Returned demonstration               PT Long Term Goals - 12/26/19 1321      PT LONG TERM GOAL #1   Title Pt will increase FOTO score to 53 to display perceived improvements in functional mobility.    Baseline 8/9: 38; 9/13:43    Time 4    Period Weeks    Status Partially Met    Target Date 01/23/20      PT LONG TERM GOAL #2   Title Pt. will report no dizziness/imbalance/fatigue after approx 10 mins of yardwork to improve ability to complete ADLs.    Baseline initial: pt feels dizziness/imbalance/fatigue after 10 mins of yardwork; 9/13: pt states that he feels he is able to complete more than 10 mins    Time 4    Period Weeks    Status Achieved    Target Date 12/26/19      PT LONG TERM GOAL #3   Title Pt will improve Berg balance to 52/56 to decrease falls risk.    Baseline initial: 47/56; 9/13: 54/56    Time 4    Period Weeks    Status Achieved    Target Date 12/26/19      PT LONG TERM GOAL #4   Title Pt will improve bilat hip flexion strength to 5/5 to to improve functional mobility.    Baseline initial: R hip flex: 4+/5, L hip flex: 4-/5; 9/13: R:4/5, L:4/5    Time 4    Period Weeks    Status Partially Met    Target Date 01/23/20      PT LONG TERM GOAL #5   Title Pt. will score at least 22/24 on DGI to decrease falls risk during ambulation.    Baseline 9/13: 17/24    Time 4    Period Weeks    Status New    Target Date 01/23/20      Additional Long Term Goals   Additional Long Term Goals Yes      PT LONG TERM GOAL #6   Title Pt. will complete 5xSTS in at least 12 seconds to improve strength to improve functional mobility.    Baseline 9/13: 15.51s    Time 4    Period Weeks    Status New    Target Date 01/23/20  Plan - 01/11/20 1437    Clinical Impression Statement Pt progressing to min lunges on bosu ball to challenge ankle stability and perform CKC LE strength exercises while minimizing potential knee pain. Pt continues to have difficulty balancing on unstable surface but has progressed to standing on BOSU ball. ModA+1 to correct 3 LOB due to lack of proprioception from polyneuropathy in feet. Pt can continue to benefit from skilled PT treatment to improve functional mobility.    Stability/Clinical Decision Making Evolving/Moderate complexity    Clinical Decision Making Moderate    Rehab Potential Good    PT Frequency 2x / week    PT Duration 4 weeks    PT Treatment/Interventions ADLs/Self Care Home Management;Electrical Stimulation;Gait training;Stair training;Functional mobility training;Therapeutic activities;Therapeutic exercise;Balance training;Neuromuscular re-education;Patient/family education    PT Next Visit Plan Eccentric step downs SLS    PT Home Exercise Plan [id number]    Consulted and Agree with Plan of Care Patient           Patient will benefit from skilled therapeutic intervention in order to improve the following deficits and impairments:  Abnormal gait, Decreased activity tolerance, Decreased balance, Decreased strength, Impaired sensation, Postural dysfunction, Difficulty walking, Dizziness, Decreased endurance  Visit Diagnosis: Imbalance  Other abnormalities of gait and mobility  Muscle weakness (generalized)     Problem List Patient Active Problem List   Diagnosis Date Noted  . Viral gastroenteritis 08/22/2019  . Acute kidney failure ([place]) 02/18/2019  . Anemia in chronic kidney disease 02/18/2019  . Secondary hyperparathyroidism of renal origin ([place]) 02/18/2019  . Right kidney mass 01/20/2019  . Erectile dysfunction due to arterial insufficiency 11/09/2018  . Respiratory illness 11/05/2018  . Tick bite of knee, initial encounter 08/17/2018  . Left shoulder pain  07/02/2018  . Allergic rhinitis 07/02/2018  . Cervical radiculopathy 11/20/2017  . Toe pain, bilateral 11/20/2017  . Abdominal pain 02/18/2017  . Clavicle enlargement 02/18/2017  . Anxiety 02/18/2017  . Degenerative arthritis of knee, bilateral 09/04/2016  . BPH (benign prostatic hyperplasia) 08/18/2016  . Knee osteoarthritis 08/18/2016  . Hyperlipidemia 05/20/2016  . Essential hypertension 05/31/2015  . Low testosterone 02/27/2015  . Diabetic neuropathy ([place]) 01/05/2013  . Obesity (BMI 30-39.9) 01/05/2013  . Chronic back pain greater than 3 months duration 01/05/2013  . Gout 01/05/2013  . OSA (obstructive sleep apnea) 01/05/2013  . Chronic kidney disease 01/04/2013  . DM type 2 with diabetic peripheral neuropathy ([place]) 01/04/2013  . Hypothyroidism 01/04/2013   Pura Spice, PT, DPT # [address], SPT 01/11/2020, 2:55 PM  [facility] [place] [address] [place], [place], [address] Phone: [phone]   Fax:  [phone]  Name: Douglas [name] MRN: [id number] Date of Birth: October 21, 1945

## 2020-01-12 ENCOUNTER — Other Ambulatory Visit: Payer: Self-pay | Admitting: Family Medicine

## 2020-01-16 ENCOUNTER — Other Ambulatory Visit: Payer: Self-pay

## 2020-01-16 ENCOUNTER — Ambulatory Visit: Payer: Medicare Other | Attending: Nephrology

## 2020-01-16 ENCOUNTER — Encounter: Payer: Self-pay | Admitting: Physical Therapy

## 2020-01-16 DIAGNOSIS — M6281 Muscle weakness (generalized): Secondary | ICD-10-CM | POA: Insufficient documentation

## 2020-01-16 DIAGNOSIS — R2689 Other abnormalities of gait and mobility: Secondary | ICD-10-CM | POA: Diagnosis not present

## 2020-01-16 NOTE — Therapy (Signed)
[facility] [address] [place], [place], [address] Phone: [phone]   Fax:  [phone]  Physical Therapy Treatment  Patient Details  Name: Douglas [name] MRN: [id number] Date of Birth: 11/06/1945 Referring Provider (PT): Munsoor Lateef, MD   Encounter Date: 01/16/2020   PT End of Session - 01/16/20 1302    Visit Number 12    Number of Visits 17    Date for PT Re-Evaluation 01/23/20    Authorization - Visit Number 7    Authorization - Number of Visits 10    PT Start Time 1259    PT Stop Time 1346    PT Time Calculation (min) 47 min    Equipment Utilized During Treatment Gait belt    Activity Tolerance Patient tolerated treatment well    Behavior During Therapy WFL for tasks assessed/performed           Past Medical History:  Diagnosis Date  . Arthritis   . Depression   . Diabetes mellitus with complication (HCC)   . Diabetic neuropathy (HCC)   . Diastolic dysfunction    a. TTE 07/2017: EF 60-65%, mild concentric LVH, no RWMA, Gr1DD, trivial AI, mildly dilated LA, RVSF normal, PASP normal  . Edema    feet/legs  . GERD (gastroesophageal reflux disease)   . Gout   . History of stress test    a. MV 06/2017: small in size, mild in severity, apical anterior and apical defect that was minimally reversible and most likely represented artifact and less likely ischemia/scar, LVEF 55-65%, low risk, probably normal stress test  . Hypertension   . Hypothyroidism   . Kidney cysts    renal failure 2013  . Kidney stones    Dr. Wolfe  . OSA (obstructive sleep apnea)    supplemental oxygen at night  . Oxygen dependent    hs  . Rocky Mountain spotted fever 12/10/2017   Positve IgG in titer on 11/20/17  . S/P cardiac cath 1998   a. no obstructive disease  . Syncope and collapse   . Temporary low platelet count (HCC)     Past Surgical History:  Procedure Laterality Date  . ANAL FISSURE REPAIR    . BACK SURGERY  1977    rupture disc lumbar spine  . CARDIAC CATHETERIZATION  1998   [place] with [facility].   . CATARACT EXTRACTION W/PHACO Right 07/16/2016   Procedure: CATARACT EXTRACTION PHACO AND INTRAOCULAR LENS PLACEMENT (IOC);  Surgeon: Steven Dingeldein, MD;  Location: [facility] ORS;  Service: Ophthalmology;  Laterality: Right;  [place] 01:11 AP% 17.6 CDE 24.83 fluid pack lot # [id number]  . CATARACT EXTRACTION W/PHACO Left 08/13/2016   Procedure: CATARACT EXTRACTION PHACO AND INTRAOCULAR LENS PLACEMENT (IOC) suture placed in left eye at end of procedure;  Surgeon: Steven Dingeldein, MD;  Location: [facility] ORS;  Service: Ophthalmology;  Laterality: Left;  [place] 01:55 AP% 22.7 CDE 53.56 fluid pack lot # [id number]  . FINGER AMPUTATION     partial  . KNEE SURGERY  1993   arthroscopy  . SHOULDER SURGERY Bilateral 1998   arthroscopic right, rotator cuff repair left    There were no vitals filed for this visit.   Subjective Assessment - 01/16/20 1259    Subjective Pt reports no falls. R sided low back pain but this occurs on a regular basis. Pt reports soreness from previous session.    Pertinent History diabetic neuropathy    How long can you   stand comfortably? 10 mins    Patient Stated Goals return to yardwork, walking    Currently in Pain? Yes    Pain Score 3     Pain Location Back    Pain Orientation Right    Pain Descriptors / Indicators Aching;Discomfort    Pain Type Neuropathic pain    Pain Onset More than a month ago    Pain Frequency Intermittent           There.ex:   Nu-Step L5 for 6 min. UE/LE use. Not billed.    Total Gym: 2x12. Min verbal and tactile cues for form/technique.    Standing B hip flexion with 5 lbs AW's: 2x15. Use of BUE's on // bars for support.     Neuro Re-Ed:    Standing B hip flexion with 5 lbs AW's. Alternating cone taps with no UE support. 1x20, CGA+1. No LOB    Alternating side steps on airex pads with soft ball: x4 in // bars and CGA+1. No  challenge to pt. Progressed to weighted 6 lbs med ball: x6 CGA+1 No LOB noted. Further progressed to 6 lbs med ball toss outside BOS. CGA+1.     SLS on foam: B. Attempt standing for 10 sec. Difficulty with RLE>LLE. CGA+1. 2x10 sec attempts.     Walking backwards in hallway: 2x64'. CGA+1. Verbal cues for form/technique.    Karaoke on wall: 2x32'. CGA+1. Verbal and tactile cues for form/technique.     PT Education - 01/16/20 1301    Education Details form/technique with exercise.    Person(s) Educated Patient    Methods Explanation;Demonstration;Tactile cues    Comprehension Verbalized understanding;Returned demonstration               PT Long Term Goals - 12/26/19 1321      PT LONG TERM GOAL #1   Title Pt will increase FOTO score to 53 to display perceived improvements in functional mobility.    Baseline 8/9: 38; 9/13:43    Time 4    Period Weeks    Status Partially Met    Target Date 01/23/20      PT LONG TERM GOAL #2   Title Pt. will report no dizziness/imbalance/fatigue after approx 10 mins of yardwork to improve ability to complete ADLs.    Baseline initial: pt feels dizziness/imbalance/fatigue after 10 mins of yardwork; 9/13: pt states that he feels he is able to complete more than 10 mins    Time 4    Period Weeks    Status Achieved    Target Date 12/26/19      PT LONG TERM GOAL #3   Title Pt will improve Berg balance to 52/56 to decrease falls risk.    Baseline initial: 47/56; 9/13: 54/56    Time 4    Period Weeks    Status Achieved    Target Date 12/26/19      PT LONG TERM GOAL #4   Title Pt will improve bilat hip flexion strength to 5/5 to to improve functional mobility.    Baseline initial: R hip flex: 4+/5, L hip flex: 4-/5; 9/13: R:4/5, L:4/5    Time 4    Period Weeks    Status Partially Met    Target Date 01/23/20      PT LONG TERM GOAL #5   Title Pt. will score at least 22/24 on DGI to decrease falls risk during ambulation.    Baseline 9/13: 17/24     Time 4      Period Weeks    Status New    Target Date 01/23/20      Additional Long Term Goals   Additional Long Term Goals Yes      PT LONG TERM GOAL #6   Title Pt. will complete 5xSTS in at least 12 seconds to improve strength to improve functional mobility.    Baseline 9/13: 15.51s    Time 4    Period Weeks    Status New    Target Date 01/23/20                 Plan - 01/16/20 1440    Clinical Impression Statement Due to pain with squatting and lunges, use of total gym for gravity reduced to incorporate CKC LE strength. No pain unless deep squat is performed. Pt also progressing with ability to perform SLS on foam with no UE support. Pt still displaying difficulty with coordinated tasks like karaoke and marches with reciprocal knee taps with contralateral UE. Pt can continue to benefit from skilled PT treatment to improve functional mobility.    Stability/Clinical Decision Making Evolving/Moderate complexity    Clinical Decision Making Moderate    Rehab Potential Good    PT Frequency 2x / week    PT Duration 4 weeks    PT Treatment/Interventions ADLs/Self Care Home Management;Electrical Stimulation;Gait training;Stair training;Functional mobility training;Therapeutic activities;Therapeutic exercise;Balance training;Neuromuscular re-education;Patient/family education    PT Next Visit Plan Total gym squats. Coordination.    PT Home Exercise Plan [id number]    Consulted and Agree with Plan of Care Patient           Patient will benefit from skilled therapeutic intervention in order to improve the following deficits and impairments:  Abnormal gait, Decreased activity tolerance, Decreased balance, Decreased strength, Impaired sensation, Postural dysfunction, Difficulty walking, Dizziness, Decreased endurance  Visit Diagnosis: Imbalance  Other abnormalities of gait and mobility  Muscle weakness (generalized)     Problem List Patient Active Problem List   Diagnosis  Date Noted  . Viral gastroenteritis 08/22/2019  . Acute kidney failure (HCC) 02/18/2019  . Anemia in chronic kidney disease 02/18/2019  . Secondary hyperparathyroidism of renal origin (HCC) 02/18/2019  . Right kidney mass 01/20/2019  . Erectile dysfunction due to arterial insufficiency 11/09/2018  . Respiratory illness 11/05/2018  . Tick bite of knee, initial encounter 08/17/2018  . Left shoulder pain 07/02/2018  . Allergic rhinitis 07/02/2018  . Cervical radiculopathy 11/20/2017  . Toe pain, bilateral 11/20/2017  . Abdominal pain 02/18/2017  . Clavicle enlargement 02/18/2017  . Anxiety 02/18/2017  . Degenerative arthritis of knee, bilateral 09/04/2016  . BPH (benign prostatic hyperplasia) 08/18/2016  . Knee osteoarthritis 08/18/2016  . Hyperlipidemia 05/20/2016  . Essential hypertension 05/31/2015  . Low testosterone 02/27/2015  . Diabetic neuropathy (HCC) 01/05/2013  . Obesity (BMI 30-39.9) 01/05/2013  . Chronic back pain greater than 3 months duration 01/05/2013  . Gout 01/05/2013  . OSA (obstructive sleep apnea) 01/05/2013  . Chronic kidney disease 01/04/2013  . DM type 2 with diabetic peripheral neuropathy (HCC) 01/04/2013  . Hypothyroidism 01/04/2013    Milton Fairly, SPT 01/16/2020, 2:45 PM  [facility] [address] [place], [place], [address] Phone: [phone]   Fax:  [phone]  Name: Riggs L Skare MRN: [id number] Date of Birth: 01/06/1946   

## 2020-01-18 ENCOUNTER — Other Ambulatory Visit: Payer: Self-pay

## 2020-01-18 ENCOUNTER — Ambulatory Visit: Payer: Medicare Other | Admitting: Physical Therapy

## 2020-01-18 ENCOUNTER — Encounter: Payer: Self-pay | Admitting: Physical Therapy

## 2020-01-18 DIAGNOSIS — R2689 Other abnormalities of gait and mobility: Secondary | ICD-10-CM

## 2020-01-18 DIAGNOSIS — M6281 Muscle weakness (generalized): Secondary | ICD-10-CM

## 2020-01-18 NOTE — Therapy (Signed)
[facility] [address] [place], [place], [address] Phone: [phone]   Fax:  [phone]  Physical Therapy Treatment  Patient Details  Name: Douglas [name] MRN: [id number] Date of Birth: 29-Jun-1945 Referring Provider (PT): Anthonette Legato, MD   Encounter Date: 01/18/2020   PT End of Session - 01/18/20 1307    Visit Number 13    Number of Visits 17    Date for PT Re-Evaluation 01/23/20    Authorization - Visit Number 8    Authorization - Number of Visits 10    PT Start Time 7510    PT Stop Time 1343    PT Time Calculation (min) 50 min    Equipment Utilized During Treatment Gait belt    Activity Tolerance Patient tolerated treatment well    Behavior During Therapy [facility] for tasks assessed/performed           Past Medical History:  Diagnosis Date  . Arthritis   . Depression   . Diabetes mellitus with complication ([place])   . Diabetic neuropathy ([place])   . Diastolic dysfunction    a. TTE 07/2017: EF 60-65%, mild concentric LVH, no RWMA, Gr1DD, trivial AI, mildly dilated LA, RVSF normal, PASP normal  . Edema    feet/legs  . GERD (gastroesophageal reflux disease)   . Gout   . History of stress test    a. MV 06/2017: small in size, mild in severity, apical anterior and apical defect that was minimally reversible and most likely represented artifact and less likely ischemia/scar, LVEF 55-65%, low risk, probably normal stress test  . Hypertension   . Hypothyroidism   . Kidney cysts    renal failure 2013  . Kidney stones    Dr. Rogers Blocker  . OSA (obstructive sleep apnea)    supplemental oxygen at night  . Oxygen dependent    hs  . Rocky Mountain spotted fever 12/10/2017   Positve IgG in titer on 11/20/17  . S/P cardiac cath 1998   a. no obstructive disease  . Syncope and collapse   . Temporary low platelet count (HCC)     Past Surgical History:  Procedure Laterality Date  . ANAL FISSURE REPAIR    . BACK SURGERY  1977    rupture disc lumbar spine  . [facility].   Marland Kitchen CATARACT EXTRACTION W/PHACO Right 07/16/2016   Procedure: CATARACT EXTRACTION PHACO AND INTRAOCULAR LENS PLACEMENT (IOC);  Surgeon: [name], MD;  Location: [facility] ORS;  Service: Ophthalmology;  Laterality: Right;  [place] 01:11 AP% 17.6 CDE 24.83 fluid pack lot # [id number]  . CATARACT EXTRACTION W/PHACO Left 08/13/2016   Procedure: CATARACT EXTRACTION PHACO AND INTRAOCULAR LENS PLACEMENT ([place]) suture placed in left eye at end of procedure;  Surgeon: [name], MD;  Location: [facility] ORS;  Service: Ophthalmology;  Laterality: Left;  [place] 01:55 AP% 22.7 CDE 53.56 fluid pack lot # [id number]  . FINGER AMPUTATION     partial  . KNEE SURGERY  1993   arthroscopy  . SHOULDER SURGERY Bilateral 1998   arthroscopic right, rotator cuff repair left    There were no vitals filed for this visit.   Subjective Assessment - 01/18/20 1305    Subjective Pt reports rolling out of his bed last night falling to the floor. Reports this has ocurred 1 time before last month. No injury from rolling out of the bed.    Pertinent History diabetic  neuropathy    How long can you stand comfortably? 10 mins    Patient Stated Goals return to yardwork, walking    Currently in Pain? No/denies    Pain Score 0-No pain    Pain Onset More than a month ago          There.ex:   Nu-Step L5 for 10 min. UE/LE use. Not billed    Neuro Re-Ed:   Backwards walking in hallway: 2x64'. Sinlge LOB that pt was able to correct indep. SBA+1   Backwards walking in hallway: 2x64'. Counting by 3's for the first bout then 7's on second bout. Increased difficulty with cognitive dual task with counting and noted variable step lengths, sway bilaterally and intermittent LOB requiring constant CGA+1 and intermittent minA+1 to correct LOB   4x32' karaoke no UE support. CGA+1. Difficulty to R>L.  8 lbs med ball rebounder on  airex pad: 1x10 tosses feet apart. 1x10 tosses feet together. No LOB. CGA+1  SLS on stable surface: On RLE pt able to hold SLS for 8 sec. LLE pt only able to hold for 2-3 sec. CGA+1  SLS with ball tosses. Blue mat behind for safety and CGA+1: on RLE able to pass ball x4 before bring LLE to surface. On LLE pt only able to maintain SLS for x2 passes.   Obstacle course: alternating cone taps on blue floor ladder --> tandem walking on airex foam pad (long) --> side steps over varying height obstacles. X4. CGA+1      PT Education - 01/18/20 1306    Education Details form/technique with exercise.    Person(s) Educated Patient    Methods Explanation;Demonstration;Tactile cues;Verbal cues    Comprehension Verbalized understanding;Returned demonstration               PT Long Term Goals - 12/26/19 1321      PT LONG TERM GOAL #1   Title Pt will increase FOTO score to 53 to display perceived improvements in functional mobility.    Baseline 8/9: 38; 9/13:43    Time 4    Period Weeks    Status Partially Met    Target Date 01/23/20      PT LONG TERM GOAL #2   Title Pt. will report no dizziness/imbalance/fatigue after approx 10 mins of yardwork to improve ability to complete ADLs.    Baseline initial: pt feels dizziness/imbalance/fatigue after 10 mins of yardwork; 9/13: pt states that he feels he is able to complete more than 10 mins    Time 4    Period Weeks    Status Achieved    Target Date 12/26/19      PT LONG TERM GOAL #3   Title Pt will improve Berg balance to 52/56 to decrease falls risk.    Baseline initial: 47/56; 9/13: 54/56    Time 4    Period Weeks    Status Achieved    Target Date 12/26/19      PT LONG TERM GOAL #4   Title Pt will improve bilat hip flexion strength to 5/5 to to improve functional mobility.    Baseline initial: R hip flex: 4+/5, L hip flex: 4-/5; 9/13: R:4/5, L:4/5    Time 4    Period Weeks    Status Partially Met    Target Date 01/23/20      PT LONG  TERM GOAL #5   Title Pt. will score at least 22/24 on DGI to decrease falls risk during ambulation.    Baseline 9/13:  17/24    Time 4    Period Weeks    Status New    Target Date 01/23/20      Additional Long Term Goals   Additional Long Term Goals Yes      PT LONG TERM GOAL #6   Title Pt. will complete 5xSTS in at least 12 seconds to improve strength to improve functional mobility.    Baseline 9/13: 15.51s    Time 4    Period Weeks    Status New    Target Date 01/23/20                 Plan - 01/18/20 1348    Clinical Impression Statement Today's session focused on dynamic balance. Pt improving with backwars walking with only single LOB that pt was able to correct independently. With added cognitive dual task of counting by 3's or 7's pt displayed difficulty counting but also with variable step lengths and intermittent LOB requiring minA+1 to correct. Pt educated on techniques to reduce risk of falling out of the bed. Pt can benefit from further skilled PT treatment to further improve functional mobility and reducing risk of falls.    Stability/Clinical Decision Making Evolving/Moderate complexity    Clinical Decision Making Moderate    Rehab Potential Good    PT Frequency 2x / week    PT Duration 4 weeks    PT Treatment/Interventions ADLs/Self Care Home Management;Electrical Stimulation;Gait training;Stair training;Functional mobility training;Therapeutic activities;Therapeutic exercise;Balance training;Neuromuscular re-education;Patient/family education    PT Next Visit Plan Dual tasks    PT Home Exercise Plan [id number]    Consulted and Agree with Plan of Care Patient           Patient will benefit from skilled therapeutic intervention in order to improve the following deficits and impairments:  Abnormal gait, Decreased activity tolerance, Decreased balance, Decreased strength, Impaired sensation, Postural dysfunction, Difficulty walking, Dizziness, Decreased endurance   Visit Diagnosis: Imbalance  Other abnormalities of gait and mobility  Muscle weakness (generalized)     Problem List Patient Active Problem List   Diagnosis Date Noted  . Viral gastroenteritis 08/22/2019  . Acute kidney failure ([place]) 02/18/2019  . Anemia in chronic kidney disease 02/18/2019  . Secondary hyperparathyroidism of renal origin ([place]) 02/18/2019  . Right kidney mass 01/20/2019  . Erectile dysfunction due to arterial insufficiency 11/09/2018  . Respiratory illness 11/05/2018  . Tick bite of knee, initial encounter 08/17/2018  . Left shoulder pain 07/02/2018  . Allergic rhinitis 07/02/2018  . Cervical radiculopathy 11/20/2017  . Toe pain, bilateral 11/20/2017  . Abdominal pain 02/18/2017  . Clavicle enlargement 02/18/2017  . Anxiety 02/18/2017  . Degenerative arthritis of knee, bilateral 09/04/2016  . BPH (benign prostatic hyperplasia) 08/18/2016  . Knee osteoarthritis 08/18/2016  . Hyperlipidemia 05/20/2016  . Essential hypertension 05/31/2015  . Low testosterone 02/27/2015  . Diabetic neuropathy ([place]) 01/05/2013  . Obesity (BMI 30-39.9) 01/05/2013  . Chronic back pain greater than 3 months duration 01/05/2013  . Gout 01/05/2013  . OSA (obstructive sleep apnea) 01/05/2013  . Chronic kidney disease 01/04/2013  . DM type 2 with diabetic peripheral neuropathy ([place]) 01/04/2013  . Hypothyroidism 01/04/2013   Pura Spice, PT, DPT # [id number] Larna Daughters, SPT 01/19/2020, 10:57 AM   [facility] [address] [place], [place], [address] Phone: [phone]   Fax:  [phone]  Name: Douglas [name] MRN: [id number] Date of Birth: Jul 23, 1945

## 2020-01-19 DIAGNOSIS — E230 Hypopituitarism: Secondary | ICD-10-CM | POA: Diagnosis not present

## 2020-01-19 DIAGNOSIS — Z79899 Other long term (current) drug therapy: Secondary | ICD-10-CM | POA: Diagnosis not present

## 2020-01-19 DIAGNOSIS — N529 Male erectile dysfunction, unspecified: Secondary | ICD-10-CM | POA: Diagnosis not present

## 2020-01-19 DIAGNOSIS — R972 Elevated prostate specific antigen [PSA]: Secondary | ICD-10-CM | POA: Diagnosis not present

## 2020-01-20 ENCOUNTER — Ambulatory Visit (INDEPENDENT_AMBULATORY_CARE_PROVIDER_SITE_OTHER): Payer: Medicare Other | Admitting: Podiatry

## 2020-01-20 ENCOUNTER — Other Ambulatory Visit: Payer: Self-pay

## 2020-01-20 ENCOUNTER — Encounter: Payer: Self-pay | Admitting: Podiatry

## 2020-01-20 DIAGNOSIS — M79675 Pain in left toe(s): Secondary | ICD-10-CM | POA: Diagnosis not present

## 2020-01-20 DIAGNOSIS — L989 Disorder of the skin and subcutaneous tissue, unspecified: Secondary | ICD-10-CM | POA: Diagnosis not present

## 2020-01-20 DIAGNOSIS — B351 Tinea unguium: Secondary | ICD-10-CM

## 2020-01-20 DIAGNOSIS — M79674 Pain in right toe(s): Secondary | ICD-10-CM | POA: Diagnosis not present

## 2020-01-20 NOTE — Progress Notes (Signed)
   SUBJECTIVE Patient presents to office today complaining of elongated, thickened nails that cause pain while ambulating in shoes.  He is unable to trim his own nails.  He is also been complaining of pain to the medial border of the bilateral great toenails.  He believes the rescanning callus buildup in that area.  Patient is here for further evaluation and treatment.  Past Medical History:  Diagnosis Date  . Arthritis   . Depression   . Diabetes mellitus with complication ([place])   . Diabetic neuropathy ([place])   . Diastolic dysfunction    a. TTE 07/2017: EF 60-65%, mild concentric LVH, no RWMA, Gr1DD, trivial AI, mildly dilated LA, RVSF normal, PASP normal  . Edema    feet/legs  . GERD (gastroesophageal reflux disease)   . Gout   . History of stress test    a. MV 06/2017: small in size, mild in severity, apical anterior and apical defect that was minimally reversible and most likely represented artifact and less likely ischemia/scar, LVEF 55-65%, low risk, probably normal stress test  . Hypertension   . Hypothyroidism   . Kidney cysts    renal failure 2013  . Kidney stones    Dr. Rogers Blocker  . OSA (obstructive sleep apnea)    supplemental oxygen at night  . Oxygen dependent    hs  . [place] spotted fever 12/10/2017   Positve IgG in titer on 11/20/17  . S/P cardiac cath 1998   a. no obstructive disease  . Syncope and collapse   . Temporary low platelet count ([place])     OBJECTIVE General Patient is awake, alert, and oriented x 3 and in no acute distress. Derm Skin is dry and supple bilateral. Negative open lesions or macerations. Remaining integument unremarkable. Nails are tender, long, thickened and dystrophic with subungual debris, consistent with onychomycosis, 1-5 bilateral. No signs of infection noted.  Hyperkeratotic preulcerative callus tissue noted to the bilateral toes Vasc  DP and PT pedal pulses palpable bilaterally. Temperature gradient within normal limits.  Neuro  Epicritic and protective threshold sensation grossly intact bilaterally.  Musculoskeletal Exam No symptomatic pedal deformities noted bilateral. Muscular strength within normal limits.  ASSESSMENT 1. Onychodystrophic nails 1-5 bilateral with hyperkeratosis of nails.  2. Onychomycosis of nail due to dermatophyte bilateral 3. Pain in foot bilateral 4.  Hyperkeratotic painful callus tissue bilateral great toes  PLAN OF CARE 1. Patient evaluated today.  2. Instructed to maintain good pedal hygiene and foot care.  3. Mechanical debridement of nails 1-5 bilaterally performed using a nail nipper. Filed with dremel without incident.  4.  Excisional debridement of the hyperkeratotic callus tissue was performed using a tissue nipper without incident or bleeding  5.  Return to clinic in 3 mos.    Edrick Kins, DPM [facility]  Dr. Edrick Kins, [place]                                        [place], [place] [address]                Office [phone]  Fax [phone]

## 2020-01-23 ENCOUNTER — Ambulatory Visit: Payer: Medicare Other | Admitting: Physical Therapy

## 2020-01-23 ENCOUNTER — Encounter: Payer: Self-pay | Admitting: Physical Therapy

## 2020-01-23 ENCOUNTER — Other Ambulatory Visit: Payer: Self-pay

## 2020-01-23 DIAGNOSIS — E230 Hypopituitarism: Secondary | ICD-10-CM | POA: Diagnosis not present

## 2020-01-23 DIAGNOSIS — R972 Elevated prostate specific antigen [PSA]: Secondary | ICD-10-CM | POA: Diagnosis not present

## 2020-01-23 DIAGNOSIS — M6281 Muscle weakness (generalized): Secondary | ICD-10-CM | POA: Diagnosis not present

## 2020-01-23 DIAGNOSIS — R2689 Other abnormalities of gait and mobility: Secondary | ICD-10-CM

## 2020-01-23 DIAGNOSIS — Z79899 Other long term (current) drug therapy: Secondary | ICD-10-CM | POA: Diagnosis not present

## 2020-01-23 NOTE — Therapy (Signed)
[facility] [address]. [place], [place], [address] Phone: [phone]   Fax:  [phone]  Physical Therapy Treatment  Patient Details  Name: Douglas [name] MRN: [id number] Date of Birth: 13-Dec-1945 Referring Provider (PT): Anthonette Legato, MD   Encounter Date: 01/23/2020   PT End of Session - 01/23/20 1512    Visit Number 14    Number of Visits 21    Date for PT Re-Evaluation 02/20/20    Authorization - Visit Number 9    Authorization - Number of Visits 14    PT Start Time 1062    PT Stop Time 6948    PT Time Calculation (min) 51 min    Equipment Utilized During Treatment Gait belt    Activity Tolerance Patient tolerated treatment well    Behavior During Therapy [facility] for tasks assessed/performed           Past Medical History:  Diagnosis Date  . Arthritis   . Depression   . Diabetes mellitus with complication ([place])   . Diabetic neuropathy ([place])   . Diastolic dysfunction    a. TTE 07/2017: EF 60-65%, mild concentric LVH, no RWMA, Gr1DD, trivial AI, mildly dilated LA, RVSF normal, PASP normal  . Edema    feet/legs  . GERD (gastroesophageal reflux disease)   . Gout   . History of stress test    a. MV 06/2017: small in size, mild in severity, apical anterior and apical defect that was minimally reversible and most likely represented artifact and less likely ischemia/scar, LVEF 55-65%, low risk, probably normal stress test  . Hypertension   . Hypothyroidism   . Kidney cysts    renal failure 2013  . Kidney stones    Dr. Rogers Blocker  . OSA (obstructive sleep apnea)    supplemental oxygen at night  . Oxygen dependent    hs  . Rocky [place] spotted fever 12/10/2017   Positve IgG in titer on 11/20/17  . S/P cardiac cath 1998   a. no obstructive disease  . Syncope and collapse   . Temporary low platelet count (HCC)     Past Surgical History:  Procedure Laterality Date  . ANAL FISSURE REPAIR    . BACK SURGERY  1977    rupture disc lumbar spine  . [facility].   Marland Kitchen CATARACT EXTRACTION W/PHACO Right 07/16/2016   Procedure: CATARACT EXTRACTION PHACO AND INTRAOCULAR LENS PLACEMENT (IOC);  Surgeon: [name], MD;  Location: [facility] ORS;  Service: Ophthalmology;  Laterality: Right;  [place] 01:11 AP% 17.6 CDE 24.83 fluid pack lot # [id number]  . CATARACT EXTRACTION W/PHACO Left 08/13/2016   Procedure: CATARACT EXTRACTION PHACO AND INTRAOCULAR LENS PLACEMENT ([place]) suture placed in left eye at end of procedure;  Surgeon: [name], MD;  Location: [facility] ORS;  Service: Ophthalmology;  Laterality: Left;  [place] 01:55 AP% 22.7 CDE 53.56 fluid pack lot # [id number]  . FINGER AMPUTATION     partial  . KNEE SURGERY  1993   arthroscopy  . SHOULDER SURGERY Bilateral 1998   arthroscopic right, rotator cuff repair left    There were no vitals filed for this visit.   Subjective Assessment - 01/23/20 1419    Subjective Pt denies rolling out of bed. Been busy with multiple medical appointments. Pt wishes he could still be better with asc/desc stairs. No falls reported.    Pertinent History diabetic neuropathy    How  long can you stand comfortably? 10 mins    Patient Stated Goals return to yardwork, walking    Currently in Pain? No/denies    Pain Score 0-No pain    Pain Onset More than a month ago           Neuro Re-Ed:   Reassessment of goals: DGI, 5xSTS, hip flexion strength, and FOTO. See goals section.  Performed 2 min walk test for new goals with improving community ambulation. Amb 382' in 2 min with gait deviations.   Updated pt POC to 1x/week for 4 weeks. Education on updated HEP.    Performed 1x8 4 way resisted hip strength with red TB. Min verbal and tactile cues for form/tehcnique. Use of mirror for form.   [facility] PT Assessment - 01/23/20 0001      Assessment   Medical Diagnosis imbalance    Referring Provider (PT) Munsoor Holley Raring, MD     Onset Date/Surgical Date 04/15/19    Prior Therapy Not reported      Balance Screen   Has the patient fallen in the past 6 months Yes    How many times? 3+      [place] residence      Prior Function   Level of Independence Independent      Cognition   Overall Cognitive Status Within Functional Limits for tasks assessed           PT Education - 01/23/20 1421    Education Details form/technique with exercise.    Person(s) Educated Patient    Methods Explanation;Demonstration;Tactile cues;Verbal cues    Comprehension Verbalized understanding;Returned demonstration               PT Long Term Goals - 01/23/20 1424      PT LONG TERM GOAL #1   Title Pt will increase FOTO score to 53 to display perceived improvements in functional mobility.    Baseline 8/9: 38; 9/13:43; 51    Time 4    Period Weeks    Status Partially Met    Target Date 02/20/20      PT LONG TERM GOAL #2   Title Pt. will report no dizziness/imbalance/fatigue after approx 10 mins of yardwork to improve ability to complete ADLs.    Baseline initial: pt feels dizziness/imbalance/fatigue after 10 mins of yardwork; 9/13: pt states that he feels he is able to complete more than 10 mins    Time 4    Period Weeks    Status Achieved      PT LONG TERM GOAL #3   Title Pt will improve Berg balance to 52/56 to decrease falls risk.    Baseline initial: 47/56; 9/13: 54/56    Time 4    Period Weeks    Status Achieved      PT LONG TERM GOAL #4   Title Pt will improve bilat hip flexion strength to 5/5 to to improve functional mobility.    Baseline initial: R hip flex: 4+/5, L hip flex: 4-/5; 9/13: R:4/5, L:4/5; 10/11: R/L 4+/5 bilaterally.    Time 4    Period Weeks    Status Partially Met    Target Date 02/20/20      PT LONG TERM GOAL #5   Title Pt. will score at least 22/24 on DGI to decrease falls risk during ambulation.    Baseline 9/13: 17/24; 10/11: 21/22    Time  4    Period Weeks    Status  Not Met    Target Date 02/20/20      Additional Long Term Goals   Additional Long Term Goals Yes      PT LONG TERM GOAL #6   Title Pt. will complete 5xSTS in at least 12 seconds to improve strength to improve functional mobility.    Baseline 9/13: 15.51s; 10/11: 12.0 seconds.    Time 4    Period Weeks    Status Achieved    Target Date 01/23/20      PT LONG TERM GOAL #7   Title Pt will improve 2 minute walk test by 40' with normalized gait mechanics to improve functional mobility with commmunity ambulation.    Baseline 10/11: 33' with gait deviations. Antalgic gait on RLE and variable sway due to reports of R knee pain and fatigue,    Time 4    Period Weeks    Status New    Target Date 02/20/20      PT LONG TERM GOAL #8   Title Pt will demonstrate ability to stand with single UE assist from the floor to display improvements in LE strength to indep stand up with yard/lawncare tasks, emptying dishwasher, etc.    Baseline 10/11: requires 2 UE support.    Time 4    Period Weeks    Status New    Target Date 02/20/20                 Plan - 01/23/20 1513    Clinical Impression Statement Pt making sigfnificant progress towards goals. Pt improved FOTO score to 51 with a goal of 53. Pt also met 5xSTS goal to 12 sec indicating pt at decreased risk of falls. Pt remains limited in hip flexion strength at 4+/5 bilaterally via MMT. Pt also improved in his DGI scoring a 21/24 with a goal of 22/ Although he did not meet his goal he has improved his score decreasing his risk for falls. Pt mainly limited in therapy by knee pain and BLE strength with stairs and standing up from a low position or standing from the floor. Pt and therapist discussed progressing PT to 1x/week with focus on LE strength to improve functional mobility and being indep with progressive home HEP. Pt in agreement with HEP. Pt can continue to benefit from skilled PT to improve functional  mobility.    Stability/Clinical Decision Making Evolving/Moderate complexity    Clinical Decision Making Moderate    Rehab Potential Good    PT Frequency 1x / week    PT Duration 4 weeks    PT Treatment/Interventions ADLs/Self Care Home Management;Electrical Stimulation;Gait training;Stair training;Functional mobility training;Therapeutic activities;Therapeutic exercise;Balance training;Neuromuscular re-education;Patient/family education    PT Next Visit Plan BLE strength. STS from lowered position.    PT Home Exercise Plan [id number]    Consulted and Agree with Plan of Care Patient           Patient will benefit from skilled therapeutic intervention in order to improve the following deficits and impairments:  Abnormal gait, Decreased activity tolerance, Decreased balance, Decreased strength, Impaired sensation, Postural dysfunction, Difficulty walking, Dizziness, Decreased endurance  Visit Diagnosis: Imbalance  Other abnormalities of gait and mobility  Muscle weakness (generalized)     Problem List Patient Active Problem List   Diagnosis Date Noted  . Viral gastroenteritis 08/22/2019  . Acute kidney failure ([place]) 02/18/2019  . Anemia in chronic kidney disease 02/18/2019  . Secondary hyperparathyroidism of renal origin ([place]) 02/18/2019  . Right kidney mass 01/20/2019  .  Erectile dysfunction due to arterial insufficiency 11/09/2018  . Respiratory illness 11/05/2018  . Tick bite of knee, initial encounter 08/17/2018  . Left shoulder pain 07/02/2018  . Allergic rhinitis 07/02/2018  . Cervical radiculopathy 11/20/2017  . Toe pain, bilateral 11/20/2017  . Abdominal pain 02/18/2017  . Clavicle enlargement 02/18/2017  . Anxiety 02/18/2017  . Degenerative arthritis of knee, bilateral 09/04/2016  . BPH (benign prostatic hyperplasia) 08/18/2016  . Knee osteoarthritis 08/18/2016  . Hyperlipidemia 05/20/2016  . Essential hypertension 05/31/2015  . Low testosterone 02/27/2015  .  Diabetic neuropathy ([place]) 01/05/2013  . Obesity (BMI 30-39.9) 01/05/2013  . Chronic back pain greater than 3 months duration 01/05/2013  . Gout 01/05/2013  . OSA (obstructive sleep apnea) 01/05/2013  . Chronic kidney disease 01/04/2013  . DM type 2 with diabetic peripheral neuropathy ([place]) 01/04/2013  . Hypothyroidism 01/04/2013    Larna Daughters, SPT 01/23/2020, 3:28 PM  This entire session was performed under direct supervision and direction of a licensed therapist/therapist assistant . I have personally read, edited and approve of the note as written. Myles Gip PT, DPT [phone]   [facility] [address] [place], [place], [address] Phone: [phone]   Fax:  [phone]  Name: Douglas [name] MRN: [id number] Date of Birth: 12/16/1945

## 2020-01-25 ENCOUNTER — Ambulatory Visit: Payer: Medicare Other | Admitting: Physical Therapy

## 2020-01-26 ENCOUNTER — Telehealth: Payer: Self-pay

## 2020-01-26 NOTE — Telephone Encounter (Signed)
I called the pharmacy and informed them that the PA for the Losartan was approved until 01/12/2021.  Douglas [name],cma

## 2020-01-30 ENCOUNTER — Other Ambulatory Visit: Payer: Self-pay

## 2020-01-30 ENCOUNTER — Ambulatory Visit: Payer: Medicare Other

## 2020-01-30 DIAGNOSIS — R2689 Other abnormalities of gait and mobility: Secondary | ICD-10-CM | POA: Diagnosis not present

## 2020-01-30 DIAGNOSIS — M6281 Muscle weakness (generalized): Secondary | ICD-10-CM | POA: Diagnosis not present

## 2020-01-30 NOTE — Therapy (Signed)
[facility] [address]. [place], [place], [address] Phone: [phone]   Fax:  [phone]  Physical Therapy Treatment  Patient Details  Name: Douglas [name] MRN: [id number] Date of Birth: [birth year] Referring Provider (PT): Anthonette Legato, MD   Encounter Date: 01/30/2020   PT End of Session - 01/30/20 1306    Visit Number 15    Number of Visits 21    Date for PT Re-Evaluation 02/20/20    PT Start Time 1300    PT Stop Time 1345    PT Time Calculation (min) 45 min    Equipment Utilized During Treatment Gait belt    Activity Tolerance Patient tolerated treatment well    Behavior During Therapy [facility] for tasks assessed/performed           Past Medical History:  Diagnosis Date  . Arthritis   . Depression   . Diabetes mellitus with complication ([place])   . Diabetic neuropathy ([place])   . Diastolic dysfunction    a. TTE 07/2017: EF 60-65%, mild concentric LVH, no RWMA, Gr1DD, trivial AI, mildly dilated LA, RVSF normal, PASP normal  . Edema    feet/legs  . GERD (gastroesophageal reflux disease)   . Gout   . History of stress test    a. MV 06/2017: small in size, mild in severity, apical anterior and apical defect that was minimally reversible and most likely represented artifact and less likely ischemia/scar, LVEF 55-65%, low risk, probably normal stress test  . Hypertension   . Hypothyroidism   . Kidney cysts    renal failure 2013  . Kidney stones    Dr. Rogers Blocker  . OSA (obstructive sleep apnea)    supplemental oxygen at night  . Oxygen dependent    hs  . Rocky [place] spotted fever 12/10/2017   Positve IgG in titer on 11/20/17  . S/P cardiac cath 1998   a. no obstructive disease  . Syncope and collapse   . Temporary low platelet count (HCC)     Past Surgical History:  Procedure Laterality Date  . ANAL FISSURE REPAIR    . BACK SURGERY  1977   rupture disc lumbar spine  . [facility] with [facility].   Marland Kitchen CATARACT EXTRACTION W/PHACO Right 07/16/2016   Procedure: CATARACT EXTRACTION PHACO AND INTRAOCULAR LENS PLACEMENT (IOC);  Surgeon: [name], MD;  Location: [facility] ORS;  Service: Ophthalmology;  Laterality: Right;  [place] 01:11 AP% 17.6 CDE 24.83 fluid pack lot # [id number]  . CATARACT EXTRACTION W/PHACO Left 08/13/2016   Procedure: CATARACT EXTRACTION PHACO AND INTRAOCULAR LENS PLACEMENT ([place]) suture placed in left eye at end of procedure;  Surgeon: [name], MD;  Location: [facility] ORS;  Service: Ophthalmology;  Laterality: Left;  [place] 01:55 AP% 22.7 CDE 53.56 fluid pack lot # [id number]  . FINGER AMPUTATION     partial  . KNEE SURGERY  1993   arthroscopy  . SHOULDER SURGERY Bilateral 1998   arthroscopic right, rotator cuff repair left    There were no vitals filed for this visit.   Subjective Assessment - 01/30/20 1305    Subjective Patient reported that he is doing well today, no complaints. No falls/stumbles or pain, but did report some soreness.    Pertinent History diabetic neuropathy    How long can you stand comfortably? 10 mins    Patient Stated Goals return to yardwork, walking    Currently in Pain?  No/denies    Pain Onset More than a month ago           Neuro Re-Ed:  Walking in hallway ~7f per lap: CGA-minA throughout, mild LOB noted with dual tasking/narrow base of support  Tandem walking forward [id number]   Backwards walking [id number] (tandem) naming fruits 38f27falking (normal) naming veggies 38ft51flking with horizontal head turns while saying girls names that start with "M" 38ft 54fking with Vertical head turns while saying boys names that start with "A" 38ft  55fing with counting by 3's (normal) x38ft  K55fke 38ft wit81fE leading, 38ft with65f leading, cued for activity pacing to  improve safety/control of activity  Rebounder toss: with 6lb ball, CGA-minA, one instance of modA due to LOB during  SLS activities:  Feet apart x10  Feet together x10  Semi tandem straight on bilat x10  Semi tandem 45deg angle to rebounder bilat x10  Single leg stance on RLE x5, LOB noted  Single leg stance with LLE toe touch down x5  LLE single leg stance RLE toe touch down x10  In // bars without UE support: CGA-minA due to occasional LOB airex foam:  Rainbow ball shoulder flexion/extension x10, cued for eyes/head to track and follow              ball throughout arc of motion, intermittently cued for form/technique  Rainbow ball trunk rotation x10, cued for eyes/head/trunk to follow ball, intermittently             cued for form  Therex: Total gym squats (level 22) with BTB tied around distal femurs x15. minA to get on/off machine, UE support needed for safe transfer.  Pt response/clinical impression: the patient was very motivated throughout the session, did complain of occasional low back pain or knee pain, which resolved with rest or stretches. Pt continued to need CGA-modA for all balance activities due to loss of balance, but was able to self correct 70% of the time during session. Pt is still challenged with narrow base of support as well as dual tasking. He would benefit from further skilled PT intervention to continue to progress towards goals including maximizing safety, function, and mobility.      PT Education - 01/30/20 1305    Education Details exercise form/technique    Person(s) Educated Patient    Methods Explanation;Demonstration;Tactile cues;Verbal cues    Comprehension Verbalized understanding;Returned demonstration               PT Long Term Goals - 01/23/20 1424      PT LONG TERM GOAL #1   Title Pt will increase FOTO score to 53 to display perceived improvements in functional mobility.    Baseline 8/9: 38; 9/13:43; 51    Time 4    Period Weeks    Status Partially Met    Target Date 02/20/20      PT LONG TERM GOAL #2   Title Pt. will report no  dizziness/imbalance/fatigue after approx 10 mins of yardwork to improve ability to complete ADLs.    Baseline initial: pt feels dizziness/imbalance/fatigue after 10 mins of yardwork; 9/13: pt states that he feels he is able to complete more than 10 mins    Time 4    Period Weeks    Status Achieved      PT LONG TERM GOAL #3   Title Pt will improve Berg balance to 52/56 to decrease falls risk.    Baseline  initial: 47/56; 9/13: 54/56    Time 4    Period Weeks    Status Achieved      PT LONG TERM GOAL #4   Title Pt will improve bilat hip flexion strength to 5/5 to to improve functional mobility.    Baseline initial: R hip flex: 4+/5, L hip flex: 4-/5; 9/13: R:4/5, L:4/5; 10/11: R/L 4+/5 bilaterally.    Time 4    Period Weeks    Status Partially Met    Target Date 02/20/20      PT LONG TERM GOAL #5   Title Pt. will score at least 22/24 on DGI to decrease falls risk during ambulation.    Baseline 9/13: 17/24; 10/11: 21/22    Time 4    Period Weeks    Status Not Met    Target Date 02/20/20      Additional Long Term Goals   Additional Long Term Goals Yes      PT LONG TERM GOAL #6   Title Pt. will complete 5xSTS in at least 12 seconds to improve strength to improve functional mobility.    Baseline 9/13: 15.51s; 10/11: 12.0 seconds.    Time 4    Period Weeks    Status Achieved    Target Date 01/23/20      PT LONG TERM GOAL #7   Title Pt will improve 2 minute walk test by 40' with normalized gait mechanics to improve functional mobility with commmunity ambulation.    Baseline 10/11: 53' with gait deviations. Antalgic gait on RLE and variable sway due to reports of R knee pain and fatigue,    Time 4    Period Weeks    Status New    Target Date 02/20/20      PT LONG TERM GOAL #8   Title Pt will demonstrate ability to stand with single UE assist from the floor to display improvements in LE strength to indep stand up with yard/lawncare tasks, emptying dishwasher, etc.    Baseline  10/11: requires 2 UE support.    Time 4    Period Weeks    Status New    Target Date 02/20/20                 Plan - 01/30/20 1306    Clinical Impression Statement the patient was very motivated throughout the session, did complain of occasional low back pain or knee pain, which resolved with rest or stretches. Pt continued to need CGA-modA for all balance activities due to loss of balance, but was able to self correct 70% of the time during session. Pt is still challenged with narrow base of support as well as dual tasking. He would benefit from further skilled PT intervention to continue to progress towards goals including maximizing safety, function, and mobility.    Stability/Clinical Decision Making Evolving/Moderate complexity    Rehab Potential Good    PT Frequency 1x / week    PT Duration 4 weeks    PT Treatment/Interventions ADLs/Self Care Home Management;Electrical Stimulation;Gait training;Stair training;Functional mobility training;Therapeutic activities;Therapeutic exercise;Balance training;Neuromuscular re-education;Patient/family education    PT Next Visit Plan BLE strength. STS from lowered position.    PT Home Exercise Plan [id number]    Consulted and Agree with Plan of Care Patient           Patient will benefit from skilled therapeutic intervention in order to improve the following deficits and impairments:  Abnormal gait, Decreased activity tolerance, Decreased balance, Decreased strength, Impaired sensation,  Postural dysfunction, Difficulty walking, Dizziness, Decreased endurance  Visit Diagnosis: Imbalance  Other abnormalities of gait and mobility  Muscle weakness (generalized)     Problem List Patient Active Problem List   Diagnosis Date Noted  . Viral gastroenteritis 08/22/2019  . Acute kidney failure ([place]) 02/18/2019  . Anemia in chronic kidney disease 02/18/2019  . Secondary hyperparathyroidism of renal origin ([place]) 02/18/2019  . Right kidney  mass 01/20/2019  . Erectile dysfunction due to arterial insufficiency 11/09/2018  . Respiratory illness 11/05/2018  . Tick bite of knee, initial encounter 08/17/2018  . Left shoulder pain 07/02/2018  . Allergic rhinitis 07/02/2018  . Cervical radiculopathy 11/20/2017  . Toe pain, bilateral 11/20/2017  . Abdominal pain 02/18/2017  . Clavicle enlargement 02/18/2017  . Anxiety 02/18/2017  . Degenerative arthritis of knee, bilateral 09/04/2016  . BPH (benign prostatic hyperplasia) 08/18/2016  . Knee osteoarthritis 08/18/2016  . Hyperlipidemia 05/20/2016  . Essential hypertension 05/31/2015  . Low testosterone 02/27/2015  . Diabetic neuropathy ([place]) 01/05/2013  . Obesity (BMI 30-39.9) 01/05/2013  . Chronic back pain greater than 3 months duration 01/05/2013  . Gout 01/05/2013  . OSA (obstructive sleep apnea) 01/05/2013  . Chronic kidney disease 01/04/2013  . DM type 2 with diabetic peripheral neuropathy ([place]) 01/04/2013  . Hypothyroidism 01/04/2013    Lieutenant Diego PT, DPT 2:10 PM,01/30/20   [facility] [address] [place], [place], [address] Phone: [phone]   Fax:  [phone]  Name: MATEI MAGNONE MRN: [id number] Date of Birth: 06-03-1945

## 2020-02-06 ENCOUNTER — Other Ambulatory Visit: Payer: Self-pay

## 2020-02-06 ENCOUNTER — Encounter: Payer: Self-pay | Admitting: Physical Therapy

## 2020-02-06 ENCOUNTER — Ambulatory Visit: Payer: Medicare Other

## 2020-02-06 DIAGNOSIS — M6281 Muscle weakness (generalized): Secondary | ICD-10-CM

## 2020-02-06 DIAGNOSIS — R2689 Other abnormalities of gait and mobility: Secondary | ICD-10-CM | POA: Diagnosis not present

## 2020-02-06 NOTE — Therapy (Addendum)
[facility] [address]. [place], [place], [address] Phone: [phone]   Fax:  [phone]  Physical Therapy Treatment  Patient Details  Name: Douglas [name] MRN: [id number] Date of Birth: 1946-01-13 Referring Provider (PT): Anthonette Legato, MD   Encounter Date: 02/06/2020   PT End of Session - 02/06/20 1310    Visit Number 16    Number of Visits 21    Date for PT Re-Evaluation 02/20/20    PT Start Time 1300    PT Stop Time 1346    PT Time Calculation (min) 46 min    Equipment Utilized During Treatment Gait belt    Activity Tolerance Patient tolerated treatment well    Behavior During Therapy [facility] for tasks assessed/performed           Past Medical History:  Diagnosis Date  . Arthritis   . Depression   . Diabetes mellitus with complication ([place])   . Diabetic neuropathy ([place])   . Diastolic dysfunction    a. TTE 07/2017: EF 60-65%, mild concentric LVH, no RWMA, Gr1DD, trivial AI, mildly dilated LA, RVSF normal, PASP normal  . Edema    feet/legs  . GERD (gastroesophageal reflux disease)   . Gout   . History of stress test    a. MV 06/2017: small in size, mild in severity, apical anterior and apical defect that was minimally reversible and most likely represented artifact and less likely ischemia/scar, LVEF 55-65%, low risk, probably normal stress test  . Hypertension   . Hypothyroidism   . Kidney cysts    renal failure 2013  . Kidney stones    Dr. Rogers Blocker  . OSA (obstructive sleep apnea)    supplemental oxygen at night  . Oxygen dependent    hs  . Rocky Mountain spotted fever 12/10/2017   Positve IgG in titer on 11/20/17  . S/P cardiac cath 1998   a. no obstructive disease  . Syncope and collapse   . Temporary low platelet count (HCC)     Past Surgical History:  Procedure Laterality Date  . ANAL FISSURE REPAIR    . BACK SURGERY  1977   rupture disc lumbar spine  . [facility] with [facility] and Vascular.   Marland Kitchen CATARACT EXTRACTION W/PHACO Right 07/16/2016   Procedure: CATARACT EXTRACTION PHACO AND INTRAOCULAR LENS PLACEMENT (IOC);  Surgeon: [name], MD;  Location: [facility] ORS;  Service: Ophthalmology;  Laterality: Right;  [place] 01:11 AP% 17.6 CDE 24.83 fluid pack lot # [id number]  . CATARACT EXTRACTION W/PHACO Left 08/13/2016   Procedure: CATARACT EXTRACTION PHACO AND INTRAOCULAR LENS PLACEMENT ([place]) suture placed in left eye at end of procedure;  Surgeon: [name], MD;  Location: [facility] ORS;  Service: Ophthalmology;  Laterality: Left;  [place] 01:55 AP% 22.7 CDE 53.56 fluid pack lot # [id number]  . FINGER AMPUTATION     partial  . KNEE SURGERY  1993   arthroscopy  . SHOULDER SURGERY Bilateral 1998   arthroscopic right, rotator cuff repair left    There were no vitals filed for this visit.   Subjective Assessment - 02/06/20 1308    Subjective Pt states having to get a covid test over the weekend but it was negative. He reports congestion and allergy symptoms that is making him groggy. No pain but was sore for multiple days from the previous session. No falls.    Pertinent History diabetic neuropathy    How long can  you stand comfortably? 10 mins    Patient Stated Goals return to yardwork, walking    Currently in Pain? No/denies    Pain Score 0-No pain    Pain Onset More than a month ago            There.ex:   Nu-Step L 6 for 5 min. UE/LE use. Not billed.  Total Gym: BLE 1x25. Min verbal and tactile cues for form/technique.     Neuro Re-Ed:   Walking in hallway focusing on dual tasking (70'):   Forwards walking counting by 3's for dual tasking. CGA+1    Backwards walking counting by 4's for dual tasking. CGA+1   Tandem walking counting by 7's for dual tasking.CGA+1   Noted difficulty maintaining balance with intermittent LOB laterally with all dual tasking. Pt able to correct indep.   4 lbs AW's side stepping/forward stepping  with alternating cone taps for lat glut strength and SLS balance: x6. CGA+1. Min verbal and tactile cues for form/technique.  Resisted forwards and backwards walking at [place]: 95 lbs, x3/direction. CGA+1. Increased difficulty with eccentric portion walking forward with scissoring of gait due to lack of control of balance and LE strength       Seated frequent rest breaks due to fatigue.   PT Education - 02/06/20 1309    Education Details form/technique with exercise.    Person(s) Educated Patient    Methods Explanation;Demonstration    Comprehension Verbalized understanding;Returned demonstration               PT Long Term Goals - 01/23/20 1424      PT LONG TERM GOAL #1   Title Pt will increase FOTO score to 53 to display perceived improvements in functional mobility.    Baseline 8/9: 38; 9/13:43; 51    Time 4    Period Weeks    Status Partially Met    Target Date 02/20/20      PT LONG TERM GOAL #2   Title Pt. will report no dizziness/imbalance/fatigue after approx 10 mins of yardwork to improve ability to complete ADLs.    Baseline initial: pt feels dizziness/imbalance/fatigue after 10 mins of yardwork; 9/13: pt states that he feels he is able to complete more than 10 mins    Time 4    Period Weeks    Status Achieved      PT LONG TERM GOAL #3   Title Pt will improve Berg balance to 52/56 to decrease falls risk.    Baseline initial: 47/56; 9/13: 54/56    Time 4    Period Weeks    Status Achieved      PT LONG TERM GOAL #4   Title Pt will improve bilat hip flexion strength to 5/5 to to improve functional mobility.    Baseline initial: R hip flex: 4+/5, L hip flex: 4-/5; 9/13: R:4/5, L:4/5; 10/11: R/L 4+/5 bilaterally.    Time 4    Period Weeks    Status Partially Met    Target Date 02/20/20      PT LONG TERM GOAL #5   Title Pt. will score at least 22/24 on DGI to decrease falls risk during ambulation.    Baseline 9/13: 17/24; 10/11: 21/22    Time 4    Period  Weeks    Status Not Met    Target Date 02/20/20      Additional Long Term Goals   Additional Long Term Goals Yes      PT LONG TERM  GOAL #6   Title Pt. will complete 5xSTS in at least 12 seconds to improve strength to improve functional mobility.    Baseline 9/13: 15.51s; 10/11: 12.0 seconds.    Time 4    Period Weeks    Status Achieved    Target Date 01/23/20      PT LONG TERM GOAL #7   Title Pt will improve 2 minute walk test by 40' with normalized gait mechanics to improve functional mobility with commmunity ambulation.    Baseline 10/11: 77' with gait deviations. Antalgic gait on RLE and variable sway due to reports of R knee pain and fatigue,    Time 4    Period Weeks    Status New    Target Date 02/20/20      PT LONG TERM GOAL #8   Title Pt will demonstrate ability to stand with single UE assist from the floor to display improvements in LE strength to indep stand up with yard/lawncare tasks, emptying dishwasher, etc.    Baseline 10/11: requires 2 UE support.    Time 4    Period Weeks    Status New    Target Date 02/20/20                 Plan - 02/06/20 1521    Clinical Impression Statement Pt required extra seated rest breaks today due to fatigue from congestion. Pt displays difficulty maintaining walking with resisted walking with weight causing pt to lose balance. Poor eccentric control as well due to LE weakness. Pt able to recover balance indep however. Pt can continue to benefit from further skilled PT treatment to improve functional mobility.    Stability/Clinical Decision Making Evolving/Moderate complexity    Clinical Decision Making Moderate    Rehab Potential Good    PT Frequency 1x / week    PT Duration 4 weeks    PT Treatment/Interventions ADLs/Self Care Home Management;Electrical Stimulation;Gait training;Stair training;Functional mobility training;Therapeutic activities;Therapeutic exercise;Balance training;Neuromuscular re-education;Patient/family  education    PT Next Visit Plan BLE strength    PT Home Exercise Plan [id number]    Consulted and Agree with Plan of Care Patient           Patient will benefit from skilled therapeutic intervention in order to improve the following deficits and impairments:  Abnormal gait, Decreased activity tolerance, Decreased balance, Decreased strength, Impaired sensation, Postural dysfunction, Difficulty walking, Dizziness, Decreased endurance  Visit Diagnosis: Imbalance  Other abnormalities of gait and mobility  Muscle weakness (generalized)     Problem List Patient Active Problem List   Diagnosis Date Noted  . Viral gastroenteritis 08/22/2019  . Acute kidney failure ([place]) 02/18/2019  . Anemia in chronic kidney disease 02/18/2019  . Secondary hyperparathyroidism of renal origin ([place]) 02/18/2019  . Right kidney mass 01/20/2019  . Erectile dysfunction due to arterial insufficiency 11/09/2018  . Respiratory illness 11/05/2018  . Tick bite of knee, initial encounter 08/17/2018  . Left shoulder pain 07/02/2018  . Allergic rhinitis 07/02/2018  . Cervical radiculopathy 11/20/2017  . Toe pain, bilateral 11/20/2017  . Abdominal pain 02/18/2017  . Clavicle enlargement 02/18/2017  . Anxiety 02/18/2017  . Degenerative arthritis of knee, bilateral 09/04/2016  . BPH (benign prostatic hyperplasia) 08/18/2016  . Knee osteoarthritis 08/18/2016  . Hyperlipidemia 05/20/2016  . Essential hypertension 05/31/2015  . Low testosterone 02/27/2015  . Diabetic neuropathy ([place]) 01/05/2013  . Obesity (BMI 30-39.9) 01/05/2013  . Chronic back pain greater than 3 months duration 01/05/2013  . Gout 01/05/2013  .  OSA (obstructive sleep apnea) 01/05/2013  . Chronic kidney disease 01/04/2013  . DM type 2 with diabetic peripheral neuropathy ([place]) 01/04/2013  . Hypothyroidism 01/04/2013    Larna Daughters, SPT 02/07/2020, 12:51 PM  [facility] [address]. [place], [place], [address] Phone: [phone]   Fax:  [phone]  Name: Douglas [name] MRN: [id number] Date of Birth: 1945-11-29

## 2020-02-07 ENCOUNTER — Encounter: Payer: Self-pay | Admitting: Physical Therapy

## 2020-02-07 ENCOUNTER — Ambulatory Visit: Payer: Medicare Other | Admitting: Pharmacist

## 2020-02-07 DIAGNOSIS — E785 Hyperlipidemia, unspecified: Secondary | ICD-10-CM

## 2020-02-07 DIAGNOSIS — N1831 Chronic kidney disease, stage 3a: Secondary | ICD-10-CM

## 2020-02-07 DIAGNOSIS — I10 Essential (primary) hypertension: Secondary | ICD-10-CM

## 2020-02-07 DIAGNOSIS — E1142 Type 2 diabetes mellitus with diabetic polyneuropathy: Secondary | ICD-10-CM

## 2020-02-07 NOTE — Chronic Care Management (AMB) (Signed)
Chronic Care Management   Follow Up Note   02/07/2020 Name: Douglas [name] MRN: [id number] DOB: 09-02-1945  Referred by: [name], MD Reason for referral : Chronic Care Management (Medication Management)   Douglas [name] is a 74 y.o. year old male who is a primary care patient of [name], [name], MD. The CCM team was consulted for assistance with chronic disease management and care coordination needs.    Contacted patient for medication management review.   Review of patient status, including review of consultants reports, relevant laboratory and other test results, and collaboration with appropriate care team members and the patient's provider was performed as part of comprehensive patient evaluation and provision of chronic care management services.    SDOH (Social Determinants of Health) assessments performed: Yes See Care Plan activities for detailed interventions related to [employer])     Outpatient Encounter Medications as of 02/07/2020  Medication Sig  . allopurinol (ZYLOPRIM) 300 MG tablet TAKE 1 TABLET(300 MG) BY MOUTH DAILY  . aspirin 81 MG tablet Take 1 tablet (81 mg total) by mouth daily.  . BD PEN NEEDLE NANO 2ND GEN 32G X 4 MM MISC USE DAILY  . Blood Glucose Monitoring Suppl (FREESTYLE FREEDOM LITE) W/DEVICE KIT Use as directed  . calcitRIOL (ROCALTROL) 0.25 MCG capsule Take 0.25 mcg by mouth daily.   . clove oil liquid Apply 1 application topically as needed.  . doxazosin (CARDURA) 4 MG tablet Take 0.5 tablets (2 mg total) by mouth daily.  . DULoxetine (CYMBALTA) 60 MG capsule TAKE 1 CAPSULE(60 MG) BY MOUTH DAILY  . empagliflozin (JARDIANCE) 25 MG TABS tablet Take 25 mg by mouth daily before breakfast.  . esomeprazole (NEXIUM) 40 MG capsule Take 1 capsule (40 mg total) by mouth daily at 12 noon. (Patient taking differently: Take 40 mg by mouth daily. )  . finasteride (PROSCAR) 5 MG tablet Take 1 tablet (5 mg total) by mouth at bedtime.  .  fluticasone (FLONASE) 50 MCG/ACT nasal spray Place 2 sprays into both nostrils daily.  . furosemide (LASIX) 40 MG tablet TAKE 1 TABLET(40 MG) BY MOUTH DAILY  . glucose blood (FREESTYLE LITE) test strip CHECK BLOOD SUGAR TWICE DAILY.  Marland Kitchen HYDROcodone-acetaminophen (NORCO/VICODIN) 5-325 MG tablet Take 1 tablet by mouth every 6 (six) hours as needed for moderate pain.  Marland Kitchen insulin degludec ([name] FLEXTOUCH) 100 UNIT/ML FlexTouch Pen Inject 48 Units into the skin daily.  Marland Kitchen levothyroxine (SYNTHROID) 100 MCG tablet TAKE 1 TABLET(100 MCG) BY MOUTH DAILY BEFORE BREAKFAST  . loratadine (CLARITIN) 10 MG tablet Take 10 mg by mouth daily as needed for allergies.   Marland Kitchen losartan (COZAAR) 25 MG tablet 25 mg QAM, 50 mg QPM  . Magnesium 250 MG TABS Take 1 tablet by mouth daily.   . metFORMIN (GLUCOPHAGE-XR) 500 MG 24 hr tablet Take 1 tablet (500 mg) by mouth in the morning, take 2 tablets (1000 mg) by mouth in the evening  . pregabalin (LYRICA) 100 MG capsule Take 2 capsules (200 mg total) by mouth 2 (two) times daily.  . simvastatin (ZOCOR) 40 MG tablet TAKE 1 TABLET(40 MG) BY MOUTH AT BEDTIME  . tadalafil (CIALIS) 20 MG tablet Take by mouth.  . testosterone cypionate (DEPOTESTOSTERONE CYPIONATE) 200 MG/ML injection Inject 200 mg into the muscle every 14 (fourteen) days.  . Semaglutide,0.25 or 0.5MG/DOS, (OZEMPIC, 0.25 OR 0.5 MG/DOSE,) 2 MG/1.5ML SOPN Inject 0.1875 mLs (0.25 mg total) into the skin once a week for 28 days, THEN 0.375 mLs (0.5 mg  total) once a week. (Patient not taking: Reported on 01/11/2020)   No facility-administered encounter medications on file as of 02/07/2020.     Objective:   Goals Addressed              This Visit's Progress     Patient Stated   .  PharmD "I need help affording my medications" (pt-stated)        CARE PLAN ENTRY (see longitudinal plan of care for additional care plan information)  Current Barriers:  . Social, financial, community barriers:  o Reports episodes  of higher BP since starting tesosterone IM therapy.  o Has stopped regular use of Benadryl after our previous conversation.  . Diabetes: uncontrolled; complicated by chronic medical conditions including CKD, HTN, HLD, hypogonadism, most recent A1c 7.8% . Most recent eGFR: 50 mL/min (per Care Everywhere) . Current antihyperglycemic regimen: Lantus 48 units daily, metformin XR 500 mg QAM, 1000 mg QPM, Jardiance 25 mg daily, Ozempic ordered by PCP, but patient unable to afford- working on patient assistance. Patient received application, plans to bring by the clinic within the next week  . Current blood glucose readings:  o Fasting: 183, 252 (may have missed medication dose), 159, 174, 216 o 2 hour post prandials: not checking . Cardiovascular risk reduction: o Current hypertensive regimen: losartan 25 mg QAM, 50 mg QPM, doxazosin 2 mg QPM, furosemide 40 mg QAM (reports edema); reports hx difficult-to-control BP. Notes dizziness w/ higher dose of doxazosin, afternoon dizziness/sleepiness w/ losartan 50 mg dose in the morning.  Hx hypokalemia w/ HCTZ. Itching w/ enalapril. Reported lack of benefit w/ generic atenolol. No hx spironolactone.  - Reports home SBP readings ranging from 120s-160s. Reports that BP is generally controlled when he wakes up, but is highest around 10 am. Feels that shifts in fluid (how much he is drinking, what he is drinking) has a significant impact on BP.  o Current hyperlipidemia regimen: simvastatin 40 mg QPM, last LDL well controlled at 40 o Current antiplatelet regimen: ASA 81 mg daily . Eye exam: due  . Nephropathy screening: up to date (nephrology) . Peripheral neuropathy: participating in PT for mobility support; duloxetine 60 mg daily, pregabalin 400 mg BID, hydocodone-APAP 5/325 mg PRN, topical clove oil and Salonpas  . CKD: calcitriol 0.25 mg daily; follows w/ Dr. Holley Raring . BPH: finasteride 5 mg daily . Hypogonadism: testosterone 200 mg Q14 days per Dr.  Honor Junes. . Gout: allopurinol 300 mg daily; last uric acid at goal <7 . Allergies: loratadine 10 mg daily and fluticasone 50 mg intranasal. Denies nasal congestion, but still reports a productive cough and "crusty eyes". Feels that this fall allergen season has been worse for him than previously  Pharmacist Clinical Goal(s):  Marland Kitchen Over the next 90 days, patient will work with PharmD and primary care provider to address optimized medication management  Interventions: . Comprehensive medication review performed, medication list updated in electronic medical record . Inter-disciplinary care team collaboration (see longitudinal plan of care) . Increase Lantus to 52 units (continuing to use home supply of Lantus while working on patient assistance for [place]). Continue max tolerated metformin XR 500 mg QAM, 1000 mg QPM, Jardiance 25 mg daily. Addition of GLP1 once obtained should allow for insulin reduction. . Discussed fluctuant BP. Discussed anticholinergic risks of doxazosin. Encouraged to discuss addition of low dose spironolactone w/ Dr. Holley Raring at upcoming appt.  . Reviewed [place] patient assistance program. Patient will bring application by the clinic once he has it completed.  Will collaborate w/ CPhT on submission and follow up. . Praised for d/c of Benadryl. Discussed that he could trial an alternative second generation antihistamine if he feels loratadine is not adequately controlling allergy symptoms. Continue nasal fluticasone  Patient Self Care Activities:  . Patient will check blood glucose BID, document, and provide at future appointments . Patient will collaborate w/ interdisciplinary team on medication access . Patient will take medications as prescribed . Patient will contact provider with any episodes of hypoglycemia . Patient will report any questions or concerns to provider   Please see past updates related to this goal by clicking on the "Past Updates" button in the  selected goal          Plan:  - Scheduled f/u call in ~ 6 weeks  Catie Darnelle Maffucci, PharmD, [place], [place] Pharmacist [place] [phone]

## 2020-02-07 NOTE — Patient Instructions (Addendum)
Mr. Holt,   It was great talking to you today!  Increase Lantus to 52 units daily. Continue metformin XR 500 mg in the morning, 1000 mg in the evening, and Jardiance 25 mg in the morning. Getting you on the Ozempic, once we receive it from patient assistance, will really help your sugars.   Talk to Dr. Holley Raring about your blood pressure. Doxazosin is not my favorite medication, especially in a patient like you that already struggles with dizziness and a high risk of falls. Your blood pressure is very fluctuant, and you seem to associate it with fluid (how much fluid you drink, what you are drinking, etc). I wonder if adding a low dose of spironolactone in the morning will help better control your blood pressure in a more stable way during the day. Talk to Dr. Holley Raring about this, as the use of this medication depends on your kidney function.   Try an alternative antihistamine, like cetirizine (generic Zyrtec) or levocetirizine (generic Xyzal).   As always, call me with any questions!  Catie Darnelle Maffucci, PharmD [phone]  Visit Information  Goals Addressed              This Visit's Progress     Patient Stated   .  PharmD "I need help affording my medications" (pt-stated)        CARE PLAN ENTRY (see longitudinal plan of care for additional care plan information)  Current Barriers:  . Social, financial, community barriers:  o Reports episodes of higher BP since starting tesosterone IM therapy.  o Has stopped regular use of Benadryl after our previous conversation.  . Diabetes: uncontrolled; complicated by chronic medical conditions including CKD, HTN, HLD, hypogonadism, most recent A1c 7.8% . Most recent eGFR: 50 mL/min (per Care Everywhere) . Current antihyperglycemic regimen: Lantus 48 units daily, metformin XR 500 mg QAM, 1000 mg QPM, Jardiance 25 mg daily, Ozempic ordered by PCP, but patient unable to afford- working on patient assistance. Patient received application, plans to  bring by the clinic within the next week  . Current blood glucose readings:  o Fasting: 183, 252 (may have missed medication dose), 159, 174, 216 o 2 hour post prandials: not checking . Cardiovascular risk reduction: o Current hypertensive regimen: losartan 25 mg QAM, 50 mg QPM, doxazosin 2 mg QPM, furosemide 40 mg QAM (reports edema); reports hx difficult-to-control BP. Notes dizziness w/ higher dose of doxazosin, afternoon dizziness/sleepiness w/ losartan 50 mg dose in the morning.  Hx hypokalemia w/ HCTZ. Itching w/ enalapril. Reported lack of benefit w/ generic atenolol. No hx spironolactone.  - Reports home SBP readings ranging from 120s-160s. Reports that BP is generally controlled when he wakes up, but is highest around 10 am. Feels that shifts in fluid (how much he is drinking, what he is drinking) has a significant impact on BP.  o Current hyperlipidemia regimen: simvastatin 40 mg QPM, last LDL well controlled at 40 o Current antiplatelet regimen: ASA 81 mg daily . Eye exam: due  . Nephropathy screening: up to date (nephrology) . Peripheral neuropathy: participating in PT for mobility support; duloxetine 60 mg daily, pregabalin 400 mg BID, hydocodone-APAP 5/325 mg PRN, topical clove oil and Salonpas  . CKD: calcitriol 0.25 mg daily; follows w/ Dr. Holley Raring . BPH: finasteride 5 mg daily . Hypogonadism: testosterone 200 mg Q14 days per Dr. Honor Junes. . Gout: allopurinol 300 mg daily; last uric acid at goal <7 . Allergies: loratadine 10 mg daily and fluticasone 50 mg intranasal. Denies nasal congestion,  but still reports a productive cough and "crusty eyes". Feels that this fall allergen season has been worse for him than previously  Pharmacist Clinical Goal(s):  Marland Kitchen Over the next 90 days, patient will work with PharmD and primary care provider to address optimized medication management  Interventions: . Comprehensive medication review performed, medication list updated in electronic medical  record . Inter-disciplinary care team collaboration (see longitudinal plan of care) . Increase Lantus to 52 units (continuing to use home supply of Lantus while working on patient assistance for [place]). Continue max tolerated metformin XR 500 mg QAM, 1000 mg QPM, Jardiance 25 mg daily. Addition of GLP1 once obtained should allow for insulin reduction. . Discussed fluctuant BP. Discussed anticholinergic risks of doxazosin. Encouraged to discuss addition of low dose spironolactone w/ Dr. Holley Raring at upcoming appt.  . Reviewed [place] patient assistance program. Patient will bring application by the clinic once he has it completed. Will collaborate w/ CPhT on submission and follow up. . Praised for d/c of Benadryl. Discussed that he could trial an alternative second generation antihistamine if he feels loratadine is not adequately controlling allergy symptoms. Continue nasal fluticasone  Patient Self Care Activities:  . Patient will check blood glucose BID, document, and provide at future appointments . Patient will collaborate w/ interdisciplinary team on medication access . Patient will take medications as prescribed . Patient will contact provider with any episodes of hypoglycemia . Patient will report any questions or concerns to provider   Please see past updates related to this goal by clicking on the "Past Updates" button in the selected goal         The patient verbalized understanding of instructions provided today and agreed to receive a mailed copy of patient instruction and/or educational materials.  Plan:  - Scheduled f/u call in ~ 6 weeks  Catie Darnelle Maffucci, PharmD, [place], [place] Pharmacist [place] [phone]

## 2020-02-08 ENCOUNTER — Other Ambulatory Visit: Payer: Self-pay | Admitting: Internal Medicine

## 2020-02-08 ENCOUNTER — Other Ambulatory Visit: Payer: Self-pay | Admitting: Urology

## 2020-02-08 DIAGNOSIS — N401 Enlarged prostate with lower urinary tract symptoms: Secondary | ICD-10-CM

## 2020-02-13 ENCOUNTER — Ambulatory Visit: Payer: Medicare Other | Admitting: Physical Therapy

## 2020-02-13 ENCOUNTER — Telehealth: Payer: Self-pay | Admitting: Pharmacist

## 2020-02-13 NOTE — Progress Notes (Signed)
Patient picked up medication

## 2020-02-13 NOTE — Progress Notes (Signed)
Medication Samples have been logged, labeled, and placed in the refrigerator for the patient to pick up  Drug name: Ozempic       Strength: 0.25/0.5 mg         Qty: 1 pen  LOT:  [id number] Exp.Date: 04/2022  Dosing instructions: Inject 0.25 mg once weekly for 4 weeks then increase to 0.5 mg weekly

## 2020-02-15 ENCOUNTER — Ambulatory Visit: Payer: Medicare Other | Attending: Nephrology

## 2020-02-15 ENCOUNTER — Other Ambulatory Visit: Payer: Self-pay

## 2020-02-15 ENCOUNTER — Encounter: Payer: Self-pay | Admitting: Physical Therapy

## 2020-02-15 DIAGNOSIS — R2689 Other abnormalities of gait and mobility: Secondary | ICD-10-CM | POA: Insufficient documentation

## 2020-02-15 DIAGNOSIS — M6281 Muscle weakness (generalized): Secondary | ICD-10-CM | POA: Diagnosis not present

## 2020-02-15 NOTE — Therapy (Signed)
[facility] [address]. [place], [place], [address] Phone: [phone]   Fax:  [phone]  Physical Therapy Treatment  Patient Details  Name: Douglas [name] MRN: [id number] Date of Birth: 08-29-1945 Referring Provider (PT): Anthonette Legato, MD   Encounter Date: 02/15/2020   PT End of Session - 02/15/20 1306    Visit Number 17    Number of Visits 21    Date for PT Re-Evaluation 02/20/20    PT Start Time 1301    PT Stop Time 1344    PT Time Calculation (min) 43 min    Equipment Utilized During Treatment Gait belt    Activity Tolerance Patient tolerated treatment well    Behavior During Therapy [facility] for tasks assessed/performed           Past Medical History:  Diagnosis Date  . Arthritis   . Depression   . Diabetes mellitus with complication ([place])   . Diabetic neuropathy ([place])   . Diastolic dysfunction    a. TTE 07/2017: EF 60-65%, mild concentric LVH, no RWMA, Gr1DD, trivial AI, mildly dilated LA, RVSF normal, PASP normal  . Edema    feet/legs  . GERD (gastroesophageal reflux disease)   . Gout   . History of stress test    a. MV 06/2017: small in size, mild in severity, apical anterior and apical defect that was minimally reversible and most likely represented artifact and less likely ischemia/scar, LVEF 55-65%, low risk, probably normal stress test  . Hypertension   . Hypothyroidism   . Kidney cysts    renal failure 2013  . Kidney stones    Dr. Rogers Blocker  . OSA (obstructive sleep apnea)    supplemental oxygen at night  . Oxygen dependent    hs  . [place] spotted fever 12/10/2017   Positve IgG in titer on 11/20/17  . S/P cardiac cath 1998   a. no obstructive disease  . Syncope and collapse   . Temporary low platelet count (HCC)     Past Surgical History:  Procedure Laterality Date  . ANAL FISSURE REPAIR    . BACK SURGERY  1977   rupture disc lumbar spine  . [facility]  with [facility].   Marland Kitchen CATARACT EXTRACTION W/PHACO Right 07/16/2016   Procedure: CATARACT EXTRACTION PHACO AND INTRAOCULAR LENS PLACEMENT (IOC);  Surgeon: [name], MD;  Location: [facility] ORS;  Service: Ophthalmology;  Laterality: Right;  [place] 01:11 AP% 17.6 CDE 24.83 fluid pack lot # [id number]  . CATARACT EXTRACTION W/PHACO Left 08/13/2016   Procedure: CATARACT EXTRACTION PHACO AND INTRAOCULAR LENS PLACEMENT ([place]) suture placed in left eye at end of procedure;  Surgeon: [name], MD;  Location: [facility] ORS;  Service: Ophthalmology;  Laterality: Left;  [place] 01:55 AP% 22.7 CDE 53.56 fluid pack lot # [id number]  . FINGER AMPUTATION     partial  . KNEE SURGERY  1993   arthroscopy  . SHOULDER SURGERY Bilateral 1998   arthroscopic right, rotator cuff repair left    There were no vitals filed for this visit.   Subjective Assessment - 02/15/20 1304    Subjective Patient reported that he is doing well today, no complaints.    Pertinent History diabetic neuropathy    Currently in Pain? No/denies                          Neuro Re-Ed:  Walking in hallway focusing on dual tasking (70'):                         Forwards walking counting down by 3's from 100 for dual tasking. CGA+1                          Backwards walking naming trees for dual tasking. CGA+1                         Tandem walking naming birds for dual tasking.CGA+1              Noted difficulty maintaining balance with intermittent LOB laterally with all dual tasking. Pt able to correct indep with use of walls for small assist             4 lbs AW's side stepping/forward stepping with alternating cone taps for lat glut strength and SLS balance: 3 rounds of 8 cones. CGA+1. Min verbal and tactile cues for form/technique.  4 lbs single leg cone taps with PT calling out cone colors, 1 round x3 minutes bilaterally             Resisted forwards, backwards, and lateral walking at [place]: 95  lbs, x3/direction. CGA+1. Cued for pacing, step length  Ball toss on foam without UE support, feet together x20 tosess outside BOS. Tandem with LLE in front x15 tosses outside BOS. Unable to perform >3 tosses with RLE in front due to knee pain.   Pt response/clinical impression: The patient remained highly motivated throughout session. Occasional minA for LOB during session especially with decreased base of support. The patient has demonstrated excellent progression during therapy and is also able to verbalize that he feels he has progressed as well.       PT Long Term Goals - 01/23/20 1424      PT LONG TERM GOAL #1   Title Pt will increase FOTO score to 53 to display perceived improvements in functional mobility.    Baseline 8/9: 38; 9/13:43; 51    Time 4    Period Weeks    Status Partially Met    Target Date 02/20/20      PT LONG TERM GOAL #2   Title Pt. will report no dizziness/imbalance/fatigue after approx 10 mins of yardwork to improve ability to complete ADLs.    Baseline initial: pt feels dizziness/imbalance/fatigue after 10 mins of yardwork; 9/13: pt states that he feels he is able to complete more than 10 mins    Time 4    Period Weeks    Status Achieved      PT LONG TERM GOAL #3   Title Pt will improve Berg balance to 52/56 to decrease falls risk.    Baseline initial: 47/56; 9/13: 54/56    Time 4    Period Weeks    Status Achieved      PT LONG TERM GOAL #4   Title Pt will improve bilat hip flexion strength to 5/5 to to improve functional mobility.    Baseline initial: R hip flex: 4+/5, L hip flex: 4-/5; 9/13: R:4/5, L:4/5; 10/11: R/L 4+/5 bilaterally.    Time 4    Period Weeks    Status Partially Met    Target Date 02/20/20      PT LONG TERM GOAL #5   Title Pt. will score at least 22/24 on DGI to decrease falls  risk during ambulation.    Baseline 9/13: 17/24; 10/11: 21/22    Time 4    Period Weeks    Status Not Met    Target Date 02/20/20      Additional  Long Term Goals   Additional Long Term Goals Yes      PT LONG TERM GOAL #6   Title Pt. will complete 5xSTS in at least 12 seconds to improve strength to improve functional mobility.    Baseline 9/13: 15.51s; 10/11: 12.0 seconds.    Time 4    Period Weeks    Status Achieved    Target Date 01/23/20      PT LONG TERM GOAL #7   Title Pt will improve 2 minute walk test by 40' with normalized gait mechanics to improve functional mobility with commmunity ambulation.    Baseline 10/11: 82' with gait deviations. Antalgic gait on RLE and variable sway due to reports of R knee pain and fatigue,    Time 4    Period Weeks    Status New    Target Date 02/20/20      PT LONG TERM GOAL #8   Title Pt will demonstrate ability to stand with single UE assist from the floor to display improvements in LE strength to indep stand up with yard/lawncare tasks, emptying dishwasher, etc.    Baseline 10/11: requires 2 UE support.    Time 4    Period Weeks    Status New    Target Date 02/20/20                 Plan - 02/15/20 1305    Clinical Impression Statement The patient remained highly motivated throughout session. Occasional minA for LOB during session especially with decreased base of support. The patient has demonstrated excellent progression during therapy and is also able to verbalize that he feels he has progressed as well.    Stability/Clinical Decision Making Evolving/Moderate complexity    Rehab Potential Good    PT Frequency 1x / week    PT Duration 4 weeks    PT Treatment/Interventions ADLs/Self Care Home Management;Electrical Stimulation;Gait training;Stair training;Functional mobility training;Therapeutic activities;Therapeutic exercise;Balance training;Neuromuscular re-education;Patient/family education    PT Next Visit Plan BLE strength    PT Home Exercise Plan [id number]    Consulted and Agree with Plan of Care Patient           Patient will benefit from skilled therapeutic  intervention in order to improve the following deficits and impairments:  Abnormal gait, Decreased activity tolerance, Decreased balance, Decreased strength, Impaired sensation, Postural dysfunction, Difficulty walking, Dizziness, Decreased endurance  Visit Diagnosis: Imbalance  Other abnormalities of gait and mobility  Muscle weakness (generalized)     Problem List Patient Active Problem List   Diagnosis Date Noted  . Viral gastroenteritis 08/22/2019  . Acute kidney failure ([place]) 02/18/2019  . Anemia in chronic kidney disease 02/18/2019  . Secondary hyperparathyroidism of renal origin ([place]) 02/18/2019  . Right kidney mass 01/20/2019  . Erectile dysfunction due to arterial insufficiency 11/09/2018  . Respiratory illness 11/05/2018  . Tick bite of knee, initial encounter 08/17/2018  . Left shoulder pain 07/02/2018  . Allergic rhinitis 07/02/2018  . Cervical radiculopathy 11/20/2017  . Toe pain, bilateral 11/20/2017  . Abdominal pain 02/18/2017  . Clavicle enlargement 02/18/2017  . Anxiety 02/18/2017  . Degenerative arthritis of knee, bilateral 09/04/2016  . BPH (benign prostatic hyperplasia) 08/18/2016  . Knee osteoarthritis 08/18/2016  . Hyperlipidemia 05/20/2016  . Essential  hypertension 05/31/2015  . Low testosterone 02/27/2015  . Diabetic neuropathy ([place]) 01/05/2013  . Obesity (BMI 30-39.9) 01/05/2013  . Chronic back pain greater than 3 months duration 01/05/2013  . Gout 01/05/2013  . OSA (obstructive sleep apnea) 01/05/2013  . Chronic kidney disease 01/04/2013  . DM type 2 with diabetic peripheral neuropathy ([place]) 01/04/2013  . Hypothyroidism 01/04/2013    Lieutenant Diego PT, DPT 1:54 PM,02/15/20   [facility] [address] [place], [place], [address] Phone: [phone]   Fax:  [phone]  Name: Douglas [name] MRN: [id number] Date of Birth: 04-25-1945

## 2020-02-20 ENCOUNTER — Encounter: Payer: Self-pay | Admitting: Physical Therapy

## 2020-02-20 ENCOUNTER — Other Ambulatory Visit: Payer: Self-pay

## 2020-02-20 ENCOUNTER — Ambulatory Visit: Payer: Medicare Other | Admitting: Physical Therapy

## 2020-02-20 DIAGNOSIS — N2889 Other specified disorders of kidney and ureter: Secondary | ICD-10-CM | POA: Diagnosis not present

## 2020-02-20 DIAGNOSIS — R2689 Other abnormalities of gait and mobility: Secondary | ICD-10-CM | POA: Diagnosis not present

## 2020-02-20 DIAGNOSIS — N1831 Chronic kidney disease, stage 3a: Secondary | ICD-10-CM | POA: Diagnosis not present

## 2020-02-20 DIAGNOSIS — D631 Anemia in chronic kidney disease: Secondary | ICD-10-CM | POA: Diagnosis not present

## 2020-02-20 DIAGNOSIS — I10 Essential (primary) hypertension: Secondary | ICD-10-CM | POA: Diagnosis not present

## 2020-02-20 DIAGNOSIS — M6281 Muscle weakness (generalized): Secondary | ICD-10-CM | POA: Diagnosis not present

## 2020-02-20 DIAGNOSIS — N2581 Secondary hyperparathyroidism of renal origin: Secondary | ICD-10-CM | POA: Diagnosis not present

## 2020-02-20 NOTE — Therapy (Signed)
[place] [facility] [address] [place], [place], [address] Phone: [phone]   Fax:  [phone]  Physical Therapy Discharge Summary  Patient Details  Name: Douglas [name] MRN: [id number] Date of Birth: [birth year] Referring Provider (PT): Anthonette Legato, MD   Encounter Date: 02/20/2020   PT End of Session - 02/20/20 1259    Visit Number 18    Number of Visits 21    Date for PT Re-Evaluation 02/20/20    PT Start Time 1761    PT Stop Time 1336    PT Time Calculation (min) 38 min    Equipment Utilized During Treatment Gait belt    Activity Tolerance Patient tolerated treatment well    Behavior During Therapy [facility] for tasks assessed/performed           Past Medical History:  Diagnosis Date  . Arthritis   . Depression   . Diabetes mellitus with complication ([place])   . Diabetic neuropathy ([place])   . Diastolic dysfunction    a. TTE 07/2017: EF 60-65%, mild concentric LVH, no RWMA, Gr1DD, trivial AI, mildly dilated LA, RVSF normal, PASP normal  . Edema    feet/legs  . GERD (gastroesophageal reflux disease)   . Gout   . History of stress test    a. MV 06/2017: small in size, mild in severity, apical anterior and apical defect that was minimally reversible and most likely represented artifact and less likely ischemia/scar, LVEF 55-65%, low risk, probably normal stress test  . Hypertension   . Hypothyroidism   . Kidney cysts    renal failure 2013  . Kidney stones    Dr. Rogers Blocker  . OSA (obstructive sleep apnea)    supplemental oxygen at night  . Oxygen dependent    hs  . [place] spotted fever 12/10/2017   Positve IgG in titer on 11/20/17  . S/P cardiac cath 1998   a. no obstructive disease  . Syncope and collapse   . Temporary low platelet count (HCC)     Past Surgical History:  Procedure Laterality Date  . ANAL FISSURE REPAIR    . BACK SURGERY  1977   rupture disc lumbar spine  . [facility] with [facility].   Marland Kitchen CATARACT EXTRACTION W/PHACO Right 07/16/2016   Procedure: CATARACT EXTRACTION PHACO AND INTRAOCULAR LENS PLACEMENT (IOC);  Surgeon: [name], MD;  Location: [facility] ORS;  Service: Ophthalmology;  Laterality: Right;  [place] 01:11 AP% 17.6 CDE 24.83 fluid pack lot # [id number]  . CATARACT EXTRACTION W/PHACO Left 08/13/2016   Procedure: CATARACT EXTRACTION PHACO AND INTRAOCULAR LENS PLACEMENT ([place]) suture placed in left eye at end of procedure;  Surgeon: [name], MD;  Location: [facility] ORS;  Service: Ophthalmology;  Laterality: Left;  [place] 01:55 AP% 22.7 CDE 53.56 fluid pack lot # [id number]  . FINGER AMPUTATION     partial  . KNEE SURGERY  1993   arthroscopy  . SHOULDER SURGERY Bilateral 1998   arthroscopic right, rotator cuff repair left    There were no vitals filed for this visit.   Subjective Assessment - 02/20/20 1258    Subjective Pt reports being put on new BP meds and testosterone injections. No falls or concerns. Believes he is near D/C for therapy.    Pertinent History diabetic neuropathy    Currently in Pain? Yes    Pain Score 2     Pain Location Knee  Pain Orientation Right    Pain Descriptors / Indicators Aching;Discomfort    Pain Onset More than a month ago    Pain Frequency Intermittent            Neuro Re-Ed:   Pt's goals reassessed today. Pt improved FOTO score to 60 with a target goal of 53. Pt also improved hip flexion strength to 5/5 MMT indicating improved ability to amb, asc/desc stairs, and unstable surfaces. Pt scored a 22/24 on DGI indicative pt is a safe community ambulator and is now at significant decreased risk of falls. Pt is indep with progressive HEP and safe for D/C from PT POC.    PT Long Term Goals - 02/20/20 1300      PT LONG TERM GOAL #1   Title Pt will increase FOTO score to 53 to display perceived improvements in functional mobility.    Baseline 8/9: 38; 9/13:43; 51; 11/8: 60/53     Time 4    Period Weeks    Status Achieved    Target Date 02/20/20      PT LONG TERM GOAL #2   Title Pt. will report no dizziness/imbalance/fatigue after approx 10 mins of yardwork to improve ability to complete ADLs.    Baseline initial: pt feels dizziness/imbalance/fatigue after 10 mins of yardwork; 9/13: pt states that he feels he is able to complete more than 10 mins    Time 4    Period Weeks    Status Achieved      PT LONG TERM GOAL #3   Title Pt will improve Berg balance to 52/56 to decrease falls risk.    Baseline initial: 47/56; 9/13: 54/56    Time 4    Period Weeks    Status Achieved      PT LONG TERM GOAL #4   Title Pt will improve bilat hip flexion strength to 5/5 to to improve functional mobility.    Baseline initial: R hip flex: 4+/5, L hip flex: 4-/5; 9/13: R:4/5, L:4/5; 10/11: R/L 4+/5 bilaterally.; 11/8: R/L hip flex: 5/5/ MMT    Time 4    Period Weeks    Status Achieved    Target Date 02/20/20      PT LONG TERM GOAL #5   Title Pt. will score at least 22/24 on DGI to decrease falls risk during ambulation.    Baseline 9/13: 17/24; 10/11: 21/22; 11/8: 22/24    Time 4    Period Weeks    Status Not Met      PT LONG TERM GOAL #6   Title Pt. will complete 5xSTS in at least 12 seconds to improve strength to improve functional mobility.    Baseline 9/13: 15.51s; 10/11: 12.0 seconds.    Time 4    Period Weeks    Status Achieved      PT LONG TERM GOAL #7   Title Pt will improve 2 minute walk test by 40' with normalized gait mechanics to improve functional mobility with commmunity ambulation.    Baseline 10/11: 19' with gait deviations. Antalgic gait on RLE and variable sway due to reports of R knee pain and fatigue; 11/8: 400' normalized gait pattern, no lat sway.    Time 4    Period Weeks    Status New      PT LONG TERM GOAL #8   Title Pt will demonstrate ability to stand with single UE assist from the floor to display improvements in LE strength to indep stand  up with  yard/lawncare tasks, emptying dishwasher, etc.    Baseline 10/11: requires 2 UE support.;11/8: single UE support pushing up on R knee.    Time 4    Period Weeks    Status Achieved    Target Date 02/20/20                 Plan - 02/20/20 1524    Clinical Impression Statement Pt made great progress in PT towards goals. Improved FOTO to 60/53 and hip flexion to 5/5 MMT demonstrating clinically significant improvements in functional mobility and ambulatory tasks. Pt also improved DGI to 22/24 making pt at low risk for falls with community ambulatory tasks. Pt's biggest concern is still ability to asc/desc stairs due to R knee pain. Pt may return to PT for low back and R knee referral in the future. Pt has no questions and is indep with progressive HEP.    Stability/Clinical Decision Making Evolving/Moderate complexity    Rehab Potential Good    PT Frequency 1x / week    PT Duration 4 weeks    PT Treatment/Interventions ADLs/Self Care Home Management;Electrical Stimulation;Gait training;Stair training;Functional mobility training;Therapeutic activities;Therapeutic exercise;Balance training;Neuromuscular re-education;Patient/family education    PT Next Visit Plan D/C    PT Home Exercise Plan [id number]    Consulted and Agree with Plan of Care Patient           Patient will benefit from skilled therapeutic intervention in order to improve the following deficits and impairments:  Abnormal gait, Decreased activity tolerance, Decreased balance, Decreased strength, Impaired sensation, Postural dysfunction, Difficulty walking, Dizziness, Decreased endurance  Visit Diagnosis: Imbalance  Other abnormalities of gait and mobility  Muscle weakness (generalized)     Problem List Patient Active Problem List   Diagnosis Date Noted  . Viral gastroenteritis 08/22/2019  . Acute kidney failure ([place]) 02/18/2019  . Anemia in chronic kidney disease 02/18/2019  . Secondary  hyperparathyroidism of renal origin ([place]) 02/18/2019  . Right kidney mass 01/20/2019  . Erectile dysfunction due to arterial insufficiency 11/09/2018  . Respiratory illness 11/05/2018  . Tick bite of knee, initial encounter 08/17/2018  . Left shoulder pain 07/02/2018  . Allergic rhinitis 07/02/2018  . Cervical radiculopathy 11/20/2017  . Toe pain, bilateral 11/20/2017  . Abdominal pain 02/18/2017  . Clavicle enlargement 02/18/2017  . Anxiety 02/18/2017  . Degenerative arthritis of knee, bilateral 09/04/2016  . BPH (benign prostatic hyperplasia) 08/18/2016  . Knee osteoarthritis 08/18/2016  . Hyperlipidemia 05/20/2016  . Essential hypertension 05/31/2015  . Low testosterone 02/27/2015  . Diabetic neuropathy ([place]) 01/05/2013  . Obesity (BMI 30-39.9) 01/05/2013  . Chronic back pain greater than 3 months duration 01/05/2013  . Gout 01/05/2013  . OSA (obstructive sleep apnea) 01/05/2013  . Chronic kidney disease 01/04/2013  . DM type 2 with diabetic peripheral neuropathy ([place]) 01/04/2013  . Hypothyroidism 01/04/2013   Pura Spice, PT, DPT # [id number] SPT 02/21/2020, 7:55 AM  [facility] [address] [place], [place], [address] Phone: [phone]   Fax:  [phone]  Name: ALEXI GEIBEL MRN: [id number] Date of Birth: 1945/07/23

## 2020-02-29 ENCOUNTER — Other Ambulatory Visit: Payer: Self-pay | Admitting: Family Medicine

## 2020-03-02 ENCOUNTER — Other Ambulatory Visit: Payer: Self-pay

## 2020-03-02 ENCOUNTER — Ambulatory Visit (INDEPENDENT_AMBULATORY_CARE_PROVIDER_SITE_OTHER): Payer: Medicare Other | Admitting: Family Medicine

## 2020-03-02 ENCOUNTER — Encounter: Payer: Self-pay | Admitting: Family Medicine

## 2020-03-02 VITALS — BP 150/80 | HR 96 | Temp 98.2°F | Ht 73.0 in | Wt 282.0 lb

## 2020-03-02 DIAGNOSIS — I10 Essential (primary) hypertension: Secondary | ICD-10-CM | POA: Diagnosis not present

## 2020-03-02 DIAGNOSIS — N1831 Chronic kidney disease, stage 3a: Secondary | ICD-10-CM

## 2020-03-02 DIAGNOSIS — Z23 Encounter for immunization: Secondary | ICD-10-CM | POA: Diagnosis not present

## 2020-03-02 DIAGNOSIS — E039 Hypothyroidism, unspecified: Secondary | ICD-10-CM | POA: Diagnosis not present

## 2020-03-02 DIAGNOSIS — E1142 Type 2 diabetes mellitus with diabetic polyneuropathy: Secondary | ICD-10-CM

## 2020-03-02 DIAGNOSIS — Z1283 Encounter for screening for malignant neoplasm of skin: Secondary | ICD-10-CM

## 2020-03-02 LAB — TSH: TSH: 1.72 u[IU]/mL (ref 0.35–4.50)

## 2020-03-02 LAB — HEMOGLOBIN A1C: Hgb A1c MFr Bld: 7.9 % — ABNORMAL HIGH (ref 4.6–6.5)

## 2020-03-02 MED ORDER — CARVEDILOL 3.125 MG PO TABS: 3.1250 mg | ORAL_TABLET | Freq: Two times a day (BID) | ORAL | 3 refills | Status: DC

## 2020-03-02 NOTE — Assessment & Plan Note (Signed)
Above goal.  Patient is interested in adding a beta-blocker.  We will add carvedilol 3.125 mg twice daily.  He will continue losartan 25 mg in the morning and 50 mg at night.  Also continue Cardura 2 mg daily.  He will continue Lasix as well.

## 2020-03-02 NOTE — Progress Notes (Signed)
[name], MD Phone: [phone]  Douglas [name] is a 74 y.o. male who presents today for f/u.  HYPERTENSION  Disease Monitoring  Home BP Monitoring 762'U-633H systolic Chest pain- no    Dyspnea- no Medications  Compliance-  Taking lasix, losartan, cardura  Edema- yes as he was out of lasix and just restarted this  DIABETES Disease Monitoring: Blood Sugar ranges-160s Polyuria/phagia/dipsia- polydipsia that is chronic      Optho- scheduled Medications: Compliance- taking tresiba 52 u daily and ozempic 0.25 mg weekly  Hypoglycemic symptoms- no  HYPOTHYROIDISM Disease Monitoring Weight changes: no  Skin Changes: no Heat/Cold intolerance: no  Medication Monitoring Compliance:  Taking synthroid   Last TSH:   Lab Results  Component Value Date   TSH 3.72 04/21/2019   Notes he is now on testosterone injections and wonders if that would increase his BP.   Social History   Tobacco Use  Smoking Status Never Smoker  Smokeless Tobacco Never Used     ROS see history of present illness  Objective  Physical Exam Vitals:   03/02/20 1016  BP: (!) 150/80  Pulse: 96  Temp: 98.2 F (36.8 C)  SpO2: 98%    BP Readings from Last 3 Encounters:  03/02/20 (!) 150/80  11/23/19 140/80  09/07/19 140/80   Wt Readings from Last 3 Encounters:  03/02/20 282 lb (127.9 kg)  11/23/19 264 lb 12.8 oz (120.1 kg)  09/07/19 266 lb 12.8 oz (121 kg)    Physical Exam Constitutional:      General: He is not in acute distress.    Appearance: He is not diaphoretic.  Cardiovascular:     Rate and Rhythm: Normal rate and regular rhythm.     Heart sounds: Normal heart sounds.  Pulmonary:     Effort: Pulmonary effort is normal.     Breath sounds: Normal breath sounds.  Musculoskeletal:     Comments: Trace pitting edema bilaterally  Skin:    General: Skin is warm and dry.  Neurological:     Mental Status: He is alert.      Assessment/Plan: Please see individual problem  list.  Problem List Items Addressed This Visit    Chronic kidney disease    He will continue to see his nephrologist.      DM type 2 with diabetic peripheral neuropathy ([place])    Check A1c.  Continue [name] 52 units daily.  The patient will increase his Ozempic to 0.5 mg once weekly next week.      Relevant Orders   HgB A1c   Essential hypertension - Primary    Above goal.  Patient is interested in adding a beta-blocker.  We will add carvedilol 3.125 mg twice daily.  He will continue losartan 25 mg in the morning and 50 mg at night.  Also continue Cardura 2 mg daily.  He will continue Lasix as well.      Relevant Medications   carvedilol (COREG) 3.125 MG tablet   Hypothyroidism (Chronic)    Check TSH.  Continue Synthroid 100 mcg daily.      Relevant Medications   carvedilol (COREG) 3.125 MG tablet   Other Relevant Orders   TSH   Skin exam, screening for cancer    Refer to dermatology for skin exam at his request.      Relevant Orders   Ambulatory referral to Dermatology    Other Visit Diagnoses    Need for immunization against influenza       Relevant Orders  Flu Vaccine QUAD High Dose(Fluad) (Completed)      This visit occurred during the SARS-CoV-2 public health emergency.  Safety protocols were in place, including screening questions prior to the visit, additional usage of staff PPE, and extensive cleaning of exam room while observing appropriate contact time as indicated for disinfecting solutions.    [name], MD [place]

## 2020-03-02 NOTE — Patient Instructions (Signed)
Nice to see you. We will start her on carvedilol 3.125 mg twice daily for your blood pressure.  Please continue on the losartan, Cardura, and Lasix.  We will see you back in 4 weeks to recheck your blood pressure. We will check labs today and contact you with the results. I referred you to dermatology.  If you do not hear anything in the next 2 weeks from them please let us know.

## 2020-03-02 NOTE — Assessment & Plan Note (Signed)
Check A1c.  Continue [name] 52 units daily.  The patient will increase his Ozempic to 0.5 mg once weekly next week.

## 2020-03-02 NOTE — Assessment & Plan Note (Signed)
Check TSH.  Continue Synthroid 100 mcg daily. 

## 2020-03-02 NOTE — Assessment & Plan Note (Signed)
He will continue to see his nephrologist.

## 2020-03-02 NOTE — Assessment & Plan Note (Signed)
Refer to dermatology for skin exam at his request.

## 2020-03-05 DIAGNOSIS — L918 Other hypertrophic disorders of the skin: Secondary | ICD-10-CM | POA: Diagnosis not present

## 2020-03-05 DIAGNOSIS — L821 Other seborrheic keratosis: Secondary | ICD-10-CM | POA: Diagnosis not present

## 2020-03-05 DIAGNOSIS — D235 Other benign neoplasm of skin of trunk: Secondary | ICD-10-CM | POA: Diagnosis not present

## 2020-03-05 DIAGNOSIS — L570 Actinic keratosis: Secondary | ICD-10-CM | POA: Diagnosis not present

## 2020-03-12 DIAGNOSIS — E119 Type 2 diabetes mellitus without complications: Secondary | ICD-10-CM | POA: Diagnosis not present

## 2020-03-12 LAB — HM DIABETES EYE EXAM

## 2020-03-13 ENCOUNTER — Other Ambulatory Visit: Payer: Self-pay | Admitting: Family Medicine

## 2020-03-13 DIAGNOSIS — E1142 Type 2 diabetes mellitus with diabetic polyneuropathy: Secondary | ICD-10-CM

## 2020-03-14 ENCOUNTER — Other Ambulatory Visit: Payer: Self-pay | Admitting: Family Medicine

## 2020-03-15 ENCOUNTER — Telehealth: Payer: Self-pay | Admitting: Family Medicine

## 2020-03-15 ENCOUNTER — Other Ambulatory Visit: Payer: Self-pay

## 2020-03-15 MED ORDER — DOXAZOSIN MESYLATE 4 MG PO TABS: 2.0000 mg | ORAL_TABLET | Freq: Every day | ORAL | Status: DC

## 2020-03-15 MED ORDER — DOXAZOSIN MESYLATE 4 MG PO TABS
2.0000 mg | ORAL_TABLET | Freq: Every day | ORAL | 1 refills | Status: DC
Start: 2020-03-15 — End: 2020-07-06

## 2020-03-15 NOTE — Telephone Encounter (Signed)
Patient called in for refill on doxazosin (CARDURA) 4 MG tablet has been out for 3 days

## 2020-03-22 ENCOUNTER — Ambulatory Visit: Payer: Medicare Other | Admitting: Pharmacist

## 2020-03-22 DIAGNOSIS — N1831 Chronic kidney disease, stage 3a: Secondary | ICD-10-CM

## 2020-03-22 DIAGNOSIS — E1142 Type 2 diabetes mellitus with diabetic polyneuropathy: Secondary | ICD-10-CM

## 2020-03-22 DIAGNOSIS — I10 Essential (primary) hypertension: Secondary | ICD-10-CM

## 2020-03-22 MED ORDER — LOSARTAN POTASSIUM 25 MG PO TABS: ORAL_TABLET | ORAL | 1 refills | Status: DC

## 2020-03-22 NOTE — Patient Instructions (Addendum)
Visit Information  Patient Care Plan: Medication Management    Problem Identified: Diabetes, Hypertension     Long-Range Goal: Disease Progression Prevention   Start Date: 01/11/2020  This Visit's Progress: On track  Recent Progress: On track  Priority: High  Note:   Current Barriers:  . Unable to independently afford treatment regimen . Unable to achieve control of diabetes   Pharmacist Clinical Goal(s):  Marland Kitchen Over the next 90 days, patient will verbalize ability to afford treatment regimen. . Over the next 90 days, patient willachieve control of diabetes as evidenced by improvement in A1c through collaboration with PharmD and provider.   Interventions: . Inter-disciplinary care team collaboration (see longitudinal plan of care) . Comprehensive medication review performed; medication list updated in electronic medical record  Diabetes: . Uncontrolled;  current treatment: Ozempic 0.5 mg weekly  (x2 weeks); Lantus 52 units daily (will transition to [place] in the future via patient assistance), metformin XR 500 mg QAM, 1000 mg QPM (max tolerated dose d/t GI upset) . Current glucose readings: fasting glucose: 109-130, post prandial glucose: not checking . Was denied for [employer] patient assistance this year, but notes that it was due to an [place]. He will not have that income next year.  . Will pursue reapplying for assistance for Ozempic and [name] in 2022.  Marland Kitchen Continue current regimen. Discussed checking 2 hour post prandial glucose readings to more fully evaluate glucose control. Discussed utilizing CGM for better evaluation and accountability to dietary choices. Patient is interested. Will place [place] 2 CGM order.  Hypertension: . Uncontrolled; current treatment: losartan 25 mg QAM, 50 mg QPM, doxazosin 2 mg QPM, carvedilol 3.125 mg BID, furosemide 40 mg QAM o Notes dizziness w/ higher dose of doxazosin o Afternoon dizziness/sleepiness w/ losartan 50 mg dose in the  morning o Hx hypokalemia w/ HCTZ o Itching w/ enalapril o Reported lack of benefit w/ generic atenolol o No hx spironolactone.  . Current home readings: patient notes he ALWAYS takes BP fasting, right after taking his morning medications. Confirmed that he sits still with feet flat on the floor for at least 5 minutes before checking BP at home Date SBP DBP HR  30-Nov 123 71 90  1-Dec 127 74 91  2-Dec 156 88 83  3-Dec 157 82 85  4-Dec 113 70 78  5-Dec 152 70 75  6-Dec 105 65 85  7-Dec 163 80 83  8-Dec 159 86 83   139 76 84   . Reports occasional dizziness upon standing. Denies increased fatigue w/ starting beta blocker . Discussed that due to fluctuation of SBP , I am not going to increase carvedilol dose today. Keep appt for RN BP check next week. Encouraged patient to bring home BP machine with him for comparison. Encouraged to vary the time of day he is checking his BP  Hyperlipidemia: . Controlled; current treatment: simvastatin 40 mg daily . Recommended to continue current regimen  Peripheral Neuropathy: . Appropriately controlled; current treatment: duloxetine 60 mg daily, pregabalin 400 mg BID, hydocodone-APAP 5/325 mg PRN, topical clove oil and Salonpas; PT for mobility support . Encouraged to continue current regimen    CKD: . Stable. Current treatment: calcitriol 0.25 mg daily. Follows w/ Dr. Holley Raring . Encouraged to continue current regimen and collaboration w/ nephrology at this time   Follow Up Plan: Telephone follow up appointment with care management team member scheduled for: ~ 4 weeks     The patient verbalized understanding of instructions, educational materials,  and care plan provided today and declined offer to receive copy of patient instructions, educational materials, and care plan.   Plan: Telephone follow up appointment with care management team member scheduled for:~ 4 weeks  Catie Darnelle Maffucci, PharmD, [place], [place] Pharmacist [place] [phone]

## 2020-03-22 NOTE — Chronic Care Management (AMB) (Signed)
Chronic Care Management   Pharmacy Note  03/22/2020 Name: Douglas [name] MRN: [id number] DOB: 09-Oct-1945   Subjective:  Douglas [name] is a 74 y.o. year old male who is a primary care patient of Douglas [name], Douglas Adam, MD. The CCM team was consulted for assistance with chronic disease management and care coordination needs.    Engaged with patient by telephone for follow up visit in response to provider referral for pharmacy case management and/or care coordination services.   Consent to Services:  Douglas [name] was given information about Chronic Care Management services, agreed to services, and gave verbal consent prior to initiation of services on 01/11/20. Please see initial visit note for detailed documentation.   SDOH (Social Determinants of Health) assessments and interventions performed:  SDOH Interventions   Flowsheet Row Most Recent Value  SDOH Interventions   Financial Strain Interventions Other (Comment)  [manufacturer assistance]       Objective:  Lab Results  Component Value Date   CREATININE 1.45 06/10/2019   CREATININE 1.49 (H) 01/19/2019   CREATININE 1.54 (H) 11/12/2018    Lab Results  Component Value Date   HGBA1C 7.9 (H) 03/02/2020       Component Value Date/Time   CHOL 115 11/23/2019 1350   CHOL 142 11/12/2018 1142   TRIG 174.0 (H) 11/23/2019 1350   HDL 40.80 11/23/2019 1350   HDL 49 11/12/2018 1142   CHOLHDL 3 11/23/2019 1350   VLDL 34.8 11/23/2019 1350   LDLCALC 40 11/23/2019 1350   LDLCALC 58 11/12/2018 1142    BP Readings from Last 3 Encounters:  03/02/20 (!) 150/80  11/23/19 140/80  09/07/19 140/80    Assessment/Interventions: Review of patient past medical history, allergies, medications, health status, including review of consultants reports, laboratory and other test data, was performed as part of comprehensive evaluation and provision of chronic care management services.   Allergies  Allergen Reactions  . Bee Venom Swelling   . Dye Fdc Red [Red Dye] Swelling and Other (See Comments)    Reaction: gout  . Atenolol Other (See Comments)    Did not regulate blood pressure  . Benadryl [Diphenhydramine Hcl (Sleep)] Other (See Comments)    Hyperactivity   . Enalapril Itching    itching  . Gabapentin Palpitations and Rash    rash  . Hctz [Hydrochlorothiazide] Other (See Comments)    Decreased potassium    Medications Reviewed Today    Reviewed by De Hollingshead, RPH-CPP (Pharmacist) on 03/22/20 at 1340  Med List Status: <None>  Medication Order Taking? Sig Documenting Provider Last Dose Status Informant  allopurinol (ZYLOPRIM) 300 MG tablet [id number] Yes TAKE 1 TABLET(300 MG) BY MOUTH DAILY Douglas Haven, MD Taking Active   aspirin 81 MG tablet [id number] Yes Take 1 tablet (81 mg total) by mouth daily. Wellington Hampshire, MD Taking Active Self  BD PEN NEEDLE NANO 2ND GEN 32G X 4 MM MISC [id number] Yes USE DAILY Douglas Haven, MD Taking Active   Blood Glucose Monitoring Suppl (FREESTYLE FREEDOM LITE) W/DEVICE KIT [id number] Yes Use as directed Douglas Confer, MD Taking Active Self  calcitRIOL (ROCALTROL) 0.25 MCG capsule [id number] Yes Take 0.25 mcg by mouth daily.  [provider] Taking Active Self  carvedilol (COREG) 3.125 MG tablet [id number]  Take 1 tablet (3.125 mg total) by mouth 2 (two) times daily with a meal. Douglas Haven, MD  Active   clove oil liquid [id number] Yes Apply 1 application topically as needed. [provider]  Taking Active Self  doxazosin (CARDURA) 4 MG tablet [id number] Yes Take 0.5 tablets (2 mg total) by mouth daily. Douglas Haven, MD Taking Active   DULoxetine (CYMBALTA) 60 MG capsule [id number] Yes TAKE 1 CAPSULE(60 MG) BY MOUTH DAILY Douglas Haven, MD Taking Active   esomeprazole (NEXIUM) 40 MG capsule [id number] Yes Take 1 capsule (40 mg total) by mouth daily at 12 noon.  Patient taking differently: Take 40 mg by mouth daily.   Douglas Confer, MD Taking Active Self  finasteride (PROSCAR) 5 MG tablet [id number] Yes TAKE 1 TABLET(5 MG) BY MOUTH AT BEDTIME Douglas, Ronda Fairly, MD Taking Active   fluticasone (FLONASE) 50 MCG/ACT nasal spray [id number] Yes Place 2 sprays into both nostrils daily. Douglas Haven, MD Taking Active   furosemide (LASIX) 40 MG tablet [id number] Yes TAKE 1 TABLET(40 MG) BY MOUTH DAILY Douglas Pheasant, MD Taking Active   glucose blood (FREESTYLE LITE) test strip [id number] Yes CHECK BLOOD SUGAR TWICE DAILY. Douglas Haven, MD Taking Active   HYDROcodone-acetaminophen (NORCO/VICODIN) 5-325 MG tablet [id number] Yes Take 1 tablet by mouth every 6 (six) hours as needed for moderate pain. Douglas Haven, MD Taking Active   insulin degludec [facility]) 100 UNIT/ML FlexTouch Pen [id number] Yes Inject 48 Units into the skin daily. Douglas Haven, MD Taking Active            Med Note Nat Christen Mar 22, 2020  1:36 PM) Lantus 52 units  levothyroxine (SYNTHROID) 100 MCG tablet [id number] Yes TAKE 1 TABLET(100 MCG) BY MOUTH DAILY BEFORE BREAKFAST Douglas Haven, MD Taking Active   loratadine (CLARITIN) 10 MG tablet [id number] Yes Take 10 mg by mouth daily as needed for allergies.  [provider] Taking Active Self  losartan (COZAAR) 25 MG tablet [id number] Yes TAKE 1 TABLET(25 MG) BY MOUTH TWICE DAILY Douglas Haven, MD Taking Active   Magnesium 250 MG TABS [id number] Yes Take 1 tablet by mouth daily.  [provider] Taking Active Self  metFORMIN (GLUCOPHAGE-XR) 500 MG 24 hr tablet [id number] Yes Take 1 tablet (500 mg) by mouth in the morning, take 2 tablets (1000 mg) by mouth in the evening Douglas Haven, MD Taking Active   pregabalin (LYRICA) 100 MG capsule [id number] Yes TAKE 2 CAPSULES BY MOUTH TWICE DAILY Douglas Haven, MD Taking Active   Semaglutide,0.25 or 0.5MG/DOS, (OZEMPIC, 0.25 OR 0.5 MG/DOSE,) 2 MG/1.5ML SOPN [id number] Yes Inject 0.5 mg into the skin once a  week. [provider] Taking Active   simvastatin (ZOCOR) 40 MG tablet [id number] Yes TAKE 1 TABLET(40 MG) BY MOUTH AT BEDTIME Douglas Haven, MD Taking Active   tadalafil (CIALIS) 20 MG tablet [id number] Yes Take by mouth. [provider] Taking Active   testosterone cypionate (DEPOTESTOSTERONE CYPIONATE) 200 MG/ML injection [id number] Yes Inject 200 mg into the muscle every 14 (fourteen) days. [provider] Taking Active           Patient Active Problem List   Diagnosis Date Noted  . Skin exam, screening for cancer 03/02/2020  . Anemia in chronic kidney disease 02/18/2019  . Secondary hyperparathyroidism of renal origin ([place]) 02/18/2019  . Right kidney mass 01/20/2019  . Erectile dysfunction due to arterial insufficiency 11/09/2018  . Respiratory illness 11/05/2018  . Tick bite of knee, initial encounter 08/17/2018  . Left shoulder pain 07/02/2018  . Allergic rhinitis 07/02/2018  . Cervical radiculopathy 11/20/2017  . Toe pain, bilateral 11/20/2017  .  Abdominal pain 02/18/2017  . Clavicle enlargement 02/18/2017  . Anxiety 02/18/2017  . Degenerative arthritis of knee, bilateral 09/04/2016  . BPH (benign prostatic hyperplasia) 08/18/2016  . Knee osteoarthritis 08/18/2016  . Hyperlipidemia 05/20/2016  . Essential hypertension 05/31/2015  . Low testosterone 02/27/2015  . Diabetic neuropathy ([place]) 01/05/2013  . Obesity (BMI 30-39.9) 01/05/2013  . Chronic back pain greater than 3 months duration 01/05/2013  . Gout 01/05/2013  . OSA (obstructive sleep apnea) 01/05/2013  . Chronic kidney disease 01/04/2013  . DM type 2 with diabetic peripheral neuropathy ([place]) 01/04/2013  . Hypothyroidism 01/04/2013    Medication Assistance: Denied for assistance in 2021. Will pursue in 2022  Patient Care Plan: Medication Management    Problem Identified: Diabetes, Hypertension     Long-Range Goal: Disease Progression Prevention   Start Date: 01/11/2020  This  Visit's Progress: On track  Recent Progress: On track  Priority: High  Note:   Current Barriers:  . Unable to independently afford treatment regimen . Unable to achieve control of diabetes   Pharmacist Clinical Goal(s):  Marland Kitchen Over the next 90 days, patient will verbalize ability to afford treatment regimen. . Over the next 90 days, patient willachieve control of diabetes as evidenced by improvement in A1c through collaboration with PharmD and provider.   Interventions: . Inter-disciplinary care team collaboration (see longitudinal plan of care) . Comprehensive medication review performed; medication list updated in electronic medical record  Diabetes: . Uncontrolled;  current treatment: Ozempic 0.5 mg weekly  (x2 weeks); Lantus 52 units daily (will transition to [place] in the future via patient assistance), metformin XR 500 mg QAM, 1000 mg QPM (max tolerated dose d/t GI upset) . Current glucose readings: fasting glucose: 109-130, post prandial glucose: not checking . Was denied for [employer] patient assistance this year, but notes that it was due to an [place]. He will not have that income next year.  . Will pursue reapplying for assistance for Ozempic and [name] in 2022.  Marland Kitchen Continue current regimen. Discussed checking 2 hour post prandial glucose readings to more fully evaluate glucose control. Discussed utilizing CGM for better evaluation and accountability to dietary choices. Patient is interested. Will place [place] 2 CGM order.  Hypertension: . Uncontrolled; current treatment: losartan 25 mg QAM, 50 mg QPM, doxazosin 2 mg QPM, carvedilol 3.125 mg BID, furosemide 40 mg QAM o Notes dizziness w/ higher dose of doxazosin o Afternoon dizziness/sleepiness w/ losartan 50 mg dose in the morning o Hx hypokalemia w/ HCTZ o Itching w/ enalapril o Reported lack of benefit w/ generic atenolol o No hx spironolactone.  . Current home readings: patient notes he ALWAYS takes BP fasting,  right after taking his morning medications. Confirmed that he sits still with feet flat on the floor for at least 5 minutes before checking BP at home Date SBP DBP HR  30-Nov 123 71 90  1-Dec 127 74 91  2-Dec 156 88 83  3-Dec 157 82 85  4-Dec 113 70 78  5-Dec 152 70 75  6-Dec 105 65 85  7-Dec 163 80 83  8-Dec 159 86 83   139 76 84   . Reports occasional dizziness upon standing. Denies increased fatigue w/ starting beta blocker . Discussed that due to fluctuation of SBP , I am not going to increase carvedilol dose today. Keep appt for RN BP check next week. Encouraged patient to bring home BP machine with him for comparison. Encouraged to vary the time of day he is  checking his BP  Hyperlipidemia: . Controlled; current treatment: simvastatin 40 mg daily . Recommended to continue current regimen  Peripheral Neuropathy: . Appropriately controlled; current treatment: duloxetine 60 mg daily, pregabalin 400 mg BID, hydocodone-APAP 5/325 mg PRN, topical clove oil and Salonpas; PT for mobility support . Encouraged to continue current regimen    CKD: . Stable. Current treatment: calcitriol 0.25 mg daily. Follows w/ Dr. Holley Raring . Encouraged to continue current regimen and collaboration w/ nephrology at this time   Follow Up Plan: Telephone follow up appointment with care management team member scheduled for: ~ 4 weeks      Plan: Telephone follow up appointment with care management team member scheduled for:~ 4 weeks  Catie Darnelle Maffucci, PharmD, [employer], [place] Pharmacist [place] [phone]

## 2020-03-28 ENCOUNTER — Other Ambulatory Visit: Payer: Self-pay

## 2020-03-28 ENCOUNTER — Ambulatory Visit (INDEPENDENT_AMBULATORY_CARE_PROVIDER_SITE_OTHER): Payer: Medicare Other

## 2020-03-28 VITALS — BP 144/79 | HR 83

## 2020-03-28 DIAGNOSIS — I10 Essential (primary) hypertension: Secondary | ICD-10-CM

## 2020-03-28 NOTE — Progress Notes (Signed)
Patient is here for a BP check due to bp being high at last visit, as per patient.  Currently patients BP is 144/79 and BPM is 83.  Patient has not taken his BP medication this morning. Patient has no complaints of headaches, blurry vision, chest pain, arm pain, light headedness, dizziness, and nor jaw pain. Please see previous note for order.

## 2020-04-03 ENCOUNTER — Ambulatory Visit: Payer: Medicare Other | Admitting: Pharmacist

## 2020-04-03 DIAGNOSIS — I10 Essential (primary) hypertension: Secondary | ICD-10-CM

## 2020-04-03 DIAGNOSIS — N1831 Chronic kidney disease, stage 3a: Secondary | ICD-10-CM

## 2020-04-03 DIAGNOSIS — E1142 Type 2 diabetes mellitus with diabetic polyneuropathy: Secondary | ICD-10-CM

## 2020-04-03 MED ORDER — CARVEDILOL 6.25 MG PO TABS: 6.2500 mg | ORAL_TABLET | Freq: Two times a day (BID) | ORAL | 3 refills | Status: DC

## 2020-04-03 NOTE — Patient Instructions (Signed)
Visit Information  Patient Care Plan: Medication Management    Problem Identified: Diabetes, Hypertension     Long-Range Goal: Disease Progression Prevention   Start Date: 01/11/2020  Recent Progress: On track  Priority: High  Note:   Current Barriers:  . Unable to independently afford treatment regimen . Unable to achieve control of diabetes   Pharmacist Clinical Goal(s):  Marland Kitchen Over the next 90 days, patient will verbalize ability to afford treatment regimen. . Over the next 90 days, patient willachieve control of diabetes as evidenced by improvement in A1c through collaboration with PharmD and provider.   Interventions: . Inter-disciplinary care team collaboration (see longitudinal plan of care) . Comprehensive medication review performed; medication list updated in electronic medical record  Diabetes: . Uncontrolled;  current treatment: Ozempic 0.5 mg weekly; Lantus 52 units daily (will transition to [place] in the future via patient assistance), metformin XR 500 mg QAM, 1000 mg QPM (max tolerated dose d/t GI upset) . Contacted [employer]. Elenor Legato 2 was covered with a $0 copay. He received this in the mail. Scheduled for a nurse visit for CGM education next week.  . Continue current regimen at this time. Will f/u in Jan at face to face visit for CGM download.  Hypertension: . Uncontrolled; current treatment: losartan 25 mg QAM, 50 mg QPM, doxazosin 2 mg QPM, carvedilol 3.125 mg BID- instructed to increase to 6.25 mg BID by PCP, does not appear patient has been called with this yet; furosemide 40 mg QAM o Notes dizziness w/ higher dose of doxazosin o Afternoon dizziness/sleepiness w/ losartan 50 mg dose in the morning o Hx hypokalemia w/ HCTZ o Itching w/ enalapril o Reported lack of benefit w/ generic atenolol o No hx spironolactone.  . Reviewed instruction from Dr. [name]. Increase carvedilol to 6.25 mg BID. Counseled that he can take 2 of his current strength BID.  We adjusted my January appt to a face to face visit to f/u on BP as instructed per PCP. Patient will continue to monitor BP and symptoms at home.   Hyperlipidemia: . Controlled; current treatment: simvastatin 40 mg daily . Recommended to continue current regimen  Peripheral Neuropathy: . Appropriately controlled; current treatment: duloxetine 60 mg daily, pregabalin 400 mg BID, hydocodone-APAP 5/325 mg PRN, topical clove oil and Salonpas; PT for mobility support . Encouraged to continue current regimen   CKD: . Stable. Current treatment: calcitriol 0.25 mg daily. Follows w/ Dr. Holley Raring . Encouraged to continue current regimen and collaboration w/ nephrology at this time   Follow Up Plan: Face to Face appointment with care management team member scheduled for:  ~ 3 weeks      The patient verbalized understanding of instructions, educational materials, and care plan provided today and declined offer to receive copy of patient instructions, educational materials, and care plan.  Plan: Face to Face appointment with care management team member scheduled for: ~ 3 weeks  Catie Darnelle Maffucci, PharmD, [place], [place] Clinical Pharmacist [employer] at [employer] [phone]

## 2020-04-03 NOTE — Chronic Care Management (AMB) (Signed)
Chronic Care Management   Pharmacy Note  04/03/2020 Name: Douglas [name] MRN: [id number] DOB: 1946-01-12  Subjective:  Douglas [name] is a 74 y.o. year old male who is a primary care patient of [name], [name], MD. The CCM team was consulted for assistance with chronic disease management and care coordination needs.    Engaged with patient by telephone for follow up on CGM order in response to provider referral for pharmacy case management and/or care coordination services.   Consent to Services:  [name] was given information about Chronic Care Management services, agreed to services, and gave verbal consent prior to initiation of services on 01/11/20. Please see initial visit note for detailed documentation.   Objective:  Lab Results  Component Value Date   CREATININE 1.45 06/10/2019   CREATININE 1.49 (H) 01/19/2019   CREATININE 1.54 (H) 11/12/2018    Lab Results  Component Value Date   HGBA1C 7.9 (H) 03/02/2020       Component Value Date/Time   CHOL 115 11/23/2019 1350   CHOL 142 11/12/2018 1142   TRIG 174.0 (H) 11/23/2019 1350   HDL 40.80 11/23/2019 1350   HDL 49 11/12/2018 1142   CHOLHDL 3 11/23/2019 1350   VLDL 34.8 11/23/2019 1350   LDLCALC 40 11/23/2019 1350   LDLCALC 58 11/12/2018 1142     BP Readings from Last 3 Encounters:  03/28/20 (!) 144/79  03/02/20 (!) 150/80  11/23/19 140/80    Assessment/Interventions: Review of patient past medical history, allergies, medications, health status, including review of consultants reports, laboratory and other test data, was performed as part of comprehensive evaluation and provision of chronic care management services.   SDOH (Social Determinants of Health) assessments and interventions performed:    CCM Care Plan  Allergies  Allergen Reactions  . Bee Venom Swelling  . Dye Fdc Red [Red Dye] Swelling and Other (See Comments)    Reaction: gout  . Atenolol Other (See Comments)    Did not  regulate blood pressure  . Benadryl [Diphenhydramine Hcl (Sleep)] Other (See Comments)    Hyperactivity   . Enalapril Itching    itching  . Gabapentin Palpitations and Rash    rash  . Hctz [Hydrochlorothiazide] Other (See Comments)    Decreased potassium    Medications Reviewed Today    Reviewed by De Hollingshead, RPH-CPP (Pharmacist) on 03/22/20 at 1340  Med List Status: <None>  Medication Order Taking? Sig Documenting Provider Last Dose Status Informant  allopurinol (ZYLOPRIM) 300 MG tablet [id number] Yes TAKE 1 TABLET(300 MG) BY MOUTH DAILY [name], MD Taking Active   aspirin 81 MG tablet [id number] Yes Take 1 tablet (81 mg total) by mouth daily. Wellington Hampshire, MD Taking Active Self  BD PEN NEEDLE NANO 2ND GEN 32G X 4 MM MISC [id number] Yes USE DAILY [name], MD Taking Active   Blood Glucose Monitoring Suppl (FREESTYLE FREEDOM LITE) W/DEVICE KIT [id number] Yes Use as directed Jackolyn Confer, MD Taking Active Self  calcitRIOL (ROCALTROL) 0.25 MCG capsule [id number] Yes Take 0.25 mcg by mouth daily.  [provider] Taking Active Self  carvedilol (COREG) 3.125 MG tablet [id number]  Take 1 tablet (3.125 mg total) by mouth 2 (two) times daily with a meal. [name], MD  Active   clove oil liquid [id number] Yes Apply 1 application topically as needed. [provider] Taking Active Self  doxazosin (CARDURA) 4 MG tablet [id number] Yes Take 0.5 tablets (2 mg total) by mouth  daily. [name], MD Taking Active   DULoxetine (CYMBALTA) 60 MG capsule [id number] Yes TAKE 1 CAPSULE(60 MG) BY MOUTH DAILY [name], MD Taking Active   esomeprazole (NEXIUM) 40 MG capsule [id number] Yes Take 1 capsule (40 mg total) by mouth daily at 12 noon.  Patient taking differently: Take 40 mg by mouth daily.   Jackolyn Confer, MD Taking Active Self  finasteride (PROSCAR) 5 MG tablet [id number] Yes TAKE 1 TABLET(5 MG) BY MOUTH AT BEDTIME Stoioff,  Ronda Fairly, MD Taking Active   fluticasone (FLONASE) 50 MCG/ACT nasal spray [id number] Yes Place 2 sprays into both nostrils daily. [name], MD Taking Active   furosemide (LASIX) 40 MG tablet [id number] Yes TAKE 1 TABLET(40 MG) BY MOUTH DAILY Einar Pheasant, MD Taking Active   glucose blood (FREESTYLE LITE) test strip [id number] Yes CHECK BLOOD SUGAR TWICE DAILY. [name], MD Taking Active   HYDROcodone-acetaminophen (NORCO/VICODIN) 5-325 MG tablet [id number] Yes Take 1 tablet by mouth every 6 (six) hours as needed for moderate pain. [name], MD Taking Active   insulin degludec [facility]) 100 UNIT/ML FlexTouch Pen [id number] Yes Inject 48 Units into the skin daily. [name], MD Taking Active            Med Note Nat Christen Mar 22, 2020  1:36 PM) Lantus 52 units  levothyroxine (SYNTHROID) 100 MCG tablet [id number] Yes TAKE 1 TABLET(100 MCG) BY MOUTH DAILY BEFORE BREAKFAST [name], MD Taking Active   loratadine (CLARITIN) 10 MG tablet [id number] Yes Take 10 mg by mouth daily as needed for allergies.  [provider] Taking Active Self  losartan (COZAAR) 25 MG tablet [id number] Yes TAKE 1 TABLET(25 MG) BY MOUTH TWICE DAILY [name], MD Taking Active   Magnesium 250 MG TABS [id number] Yes Take 1 tablet by mouth daily.  [provider] Taking Active Self  metFORMIN (GLUCOPHAGE-XR) 500 MG 24 hr tablet [id number] Yes Take 1 tablet (500 mg) by mouth in the morning, take 2 tablets (1000 mg) by mouth in the evening [name], MD Taking Active   pregabalin (LYRICA) 100 MG capsule [id number] Yes TAKE 2 CAPSULES BY MOUTH TWICE DAILY [name], MD Taking Active   Semaglutide,0.25 or 0.5MG/DOS, (OZEMPIC, 0.25 OR 0.5 MG/DOSE,) 2 MG/1.5ML SOPN [id number] Yes Inject 0.5 mg into the skin once a week. [provider] Taking Active   simvastatin (ZOCOR) 40 MG tablet [id number] Yes TAKE 1 TABLET(40 MG) BY  MOUTH AT BEDTIME [name], MD Taking Active   tadalafil (CIALIS) 20 MG tablet [id number] Yes Take by mouth. [provider] Taking Active   testosterone cypionate (DEPOTESTOSTERONE CYPIONATE) 200 MG/ML injection [id number] Yes Inject 200 mg into the muscle every 14 (fourteen) days. [provider] Taking Active           Patient Active Problem List   Diagnosis Date Noted  . Skin exam, screening for cancer 03/02/2020  . Anemia in chronic kidney disease 02/18/2019  . Secondary hyperparathyroidism of renal origin ([place]) 02/18/2019  . Right kidney mass 01/20/2019  . Erectile dysfunction due to arterial insufficiency 11/09/2018  . Respiratory illness 11/05/2018  . Tick bite of knee, initial encounter 08/17/2018  . Left shoulder pain 07/02/2018  . Allergic rhinitis 07/02/2018  . Cervical radiculopathy 11/20/2017  . Toe pain, bilateral 11/20/2017  . Abdominal pain 02/18/2017  . Clavicle enlargement 02/18/2017  . Anxiety 02/18/2017  . Degenerative arthritis of knee,  bilateral 09/04/2016  . BPH (benign prostatic hyperplasia) 08/18/2016  . Knee osteoarthritis 08/18/2016  . Hyperlipidemia 05/20/2016  . Essential hypertension 05/31/2015  . Low testosterone 02/27/2015  . Diabetic neuropathy ([place]) 01/05/2013  . Obesity (BMI 30-39.9) 01/05/2013  . Chronic back pain greater than 3 months duration 01/05/2013  . Gout 01/05/2013  . OSA (obstructive sleep apnea) 01/05/2013  . Chronic kidney disease 01/04/2013  . DM type 2 with diabetic peripheral neuropathy ([place]) 01/04/2013  . Hypothyroidism 01/04/2013    Conditions to be addressed/monitored per PCP order: HTN and DMII  Patient Care Plan: Medication Management    Problem Identified: Diabetes, Hypertension     Long-Range Goal: Disease Progression Prevention   Start Date: 01/11/2020  Recent Progress: On track  Priority: High  Note:   Current Barriers:  . Unable to independently afford treatment regimen . Unable  to achieve control of diabetes   Pharmacist Clinical Goal(s):  Marland Kitchen Over the next 90 days, patient will verbalize ability to afford treatment regimen. . Over the next 90 days, patient willachieve control of diabetes as evidenced by improvement in A1c through collaboration with PharmD and provider.   Interventions: . Inter-disciplinary care team collaboration (see longitudinal plan of care) . Comprehensive medication review performed; medication list updated in electronic medical record  Diabetes: . Uncontrolled;  current treatment: Ozempic 0.5 mg weekly; Lantus 52 units daily (will transition to [place] in the future via patient assistance), metformin XR 500 mg QAM, 1000 mg QPM (max tolerated dose d/t GI upset) . Contacted [employer]. Elenor Legato 2 was covered with a $0 copay. He received this in the mail. Scheduled for a nurse visit for CGM education next week.  . Continue current regimen at this time. Will f/u in Jan at face to face visit for CGM download.  Hypertension: . Uncontrolled; current treatment: losartan 25 mg QAM, 50 mg QPM, doxazosin 2 mg QPM, carvedilol 3.125 mg BID- instructed to increase to 6.25 mg BID by PCP, does not appear patient has been called with this yet; furosemide 40 mg QAM o Notes dizziness w/ higher dose of doxazosin o Afternoon dizziness/sleepiness w/ losartan 50 mg dose in the morning o Hx hypokalemia w/ HCTZ o Itching w/ enalapril o Reported lack of benefit w/ generic atenolol o No hx spironolactone.  . Reviewed instruction from Dr. [name]. Increase carvedilol to 6.25 mg BID. Counseled that he can take 2 of his current strength BID. We adjusted my January appt to a face to face visit to f/u on BP as instructed per PCP. Patient will continue to monitor BP and symptoms at home.   Hyperlipidemia: . Controlled; current treatment: simvastatin 40 mg daily . Recommended to continue current regimen  Peripheral Neuropathy: . Appropriately controlled;  current treatment: duloxetine 60 mg daily, pregabalin 400 mg BID, hydocodone-APAP 5/325 mg PRN, topical clove oil and Salonpas; PT for mobility support . Encouraged to continue current regimen   CKD: . Stable. Current treatment: calcitriol 0.25 mg daily. Follows w/ Dr. Holley Raring . Encouraged to continue current regimen and collaboration w/ nephrology at this time   Follow Up Plan: Face to Face appointment with care management team member scheduled for:  ~ 3 weeks     Medication Assistance: Will re-evaluate income in 2022  Plan: Face to Face appointment with care management team member scheduled for: ~ 3 weeks  Catie Darnelle Maffucci, PharmD, [place], [place] Clinical Pharmacist [employer] [facility] [employer] [phone]

## 2020-04-10 ENCOUNTER — Other Ambulatory Visit: Payer: Self-pay

## 2020-04-10 ENCOUNTER — Ambulatory Visit (INDEPENDENT_AMBULATORY_CARE_PROVIDER_SITE_OTHER): Payer: Medicare Other

## 2020-04-10 DIAGNOSIS — E1142 Type 2 diabetes mellitus with diabetic polyneuropathy: Secondary | ICD-10-CM | POA: Diagnosis not present

## 2020-04-10 NOTE — Progress Notes (Signed)
Patient presented for [facility] placement and instruction. Per Providers order from 04-03-20. Patient voiced no concerns nor showed signs of distress during placement. Also instructed patient on how to record reading.Instructed patient that if further questions, they can call clinic to be given further direction.

## 2020-04-11 DIAGNOSIS — M48061 Spinal stenosis, lumbar region without neurogenic claudication: Secondary | ICD-10-CM | POA: Diagnosis not present

## 2020-04-11 DIAGNOSIS — E11610 Type 2 diabetes mellitus with diabetic neuropathic arthropathy: Secondary | ICD-10-CM | POA: Diagnosis not present

## 2020-04-11 DIAGNOSIS — M47812 Spondylosis without myelopathy or radiculopathy, cervical region: Secondary | ICD-10-CM | POA: Diagnosis not present

## 2020-04-24 ENCOUNTER — Ambulatory Visit: Payer: Medicare Other

## 2020-04-30 ENCOUNTER — Other Ambulatory Visit: Payer: Self-pay

## 2020-04-30 ENCOUNTER — Ambulatory Visit
Admission: RE | Admit: 2020-04-30 | Discharge: 2020-04-30 | Disposition: A | Discharge status: Home / Self Care | Payer: Medicare Other | Source: Ambulatory Visit | Attending: Urology | Admitting: Urology

## 2020-04-30 DIAGNOSIS — N2889 Other specified disorders of kidney and ureter: Secondary | ICD-10-CM | POA: Diagnosis not present

## 2020-04-30 DIAGNOSIS — N281 Cyst of kidney, acquired: Secondary | ICD-10-CM | POA: Diagnosis not present

## 2020-05-01 ENCOUNTER — Telehealth: Payer: Medicare Other

## 2020-05-01 DIAGNOSIS — M5416 Radiculopathy, lumbar region: Secondary | ICD-10-CM | POA: Diagnosis not present

## 2020-05-01 DIAGNOSIS — M48061 Spinal stenosis, lumbar region without neurogenic claudication: Secondary | ICD-10-CM | POA: Diagnosis not present

## 2020-05-03 ENCOUNTER — Other Ambulatory Visit: Payer: Self-pay

## 2020-05-03 ENCOUNTER — Ambulatory Visit (INDEPENDENT_AMBULATORY_CARE_PROVIDER_SITE_OTHER): Payer: Medicare Other | Admitting: Urology

## 2020-05-03 VITALS — BP 171/81 | HR 84 | Ht 71.0 in | Wt 269.0 lb

## 2020-05-03 DIAGNOSIS — N2889 Other specified disorders of kidney and ureter: Secondary | ICD-10-CM | POA: Diagnosis not present

## 2020-05-03 DIAGNOSIS — N401 Enlarged prostate with lower urinary tract symptoms: Secondary | ICD-10-CM | POA: Diagnosis not present

## 2020-05-03 NOTE — Progress Notes (Signed)
05/03/2020 10:34 AM   Douglas [name] Dec 29, 1945 [id number]  Referring provider: [name], MD [address] [place],  [place] [address]  Chief Complaint  Patient presents with  . Benign Prostatic Hypertrophy    Urologic history: 1. T1 renal mass -CT angio chest/abdomen/pelvis with incidental enhancing 2.8 x 2.7 x 2.4 cm left upper pole mass 01/2019 -MRI abdomen without contrast 03/13/2019 ordered by nephrology measured at 2.9 x 2.5 x 2.5 cm -Elected surveillance -RUS 07/2019 2.9 x 2.6 x 3.0  2.BPH with lower urinary tract symptoms -Combinationtxdoxazosin/finasteride  3.Erectile dysfunction   HPI: 75 y.o. male presents for follow-up of renal mass.   Doing well since last visit and denies flank or abdominal pain  No bothersome LUTS  On doxazosin and dutasteride  Denies dysuria, gross hematuria  Follow-up renal ultrasound performed 04/30/2020 with stable left upper pole renal mass measuring 2.73 x 2.38 x 2.97 cm   PMH: Past Medical History:  Diagnosis Date  . Arthritis   . Depression   . Diabetes mellitus with complication ([place])   . Diabetic neuropathy ([place])   . Diastolic dysfunction    a. TTE 07/2017: EF 60-65%, mild concentric LVH, no RWMA, Gr1DD, trivial AI, mildly dilated LA, RVSF normal, PASP normal  . Edema    feet/legs  . GERD (gastroesophageal reflux disease)   . Gout   . History of stress test    a. MV 06/2017: small in size, mild in severity, apical anterior and apical defect that was minimally reversible and most likely represented artifact and less likely ischemia/scar, LVEF 55-65%, low risk, probably normal stress test  . Hypertension   . Hypothyroidism   . Kidney cysts    renal failure 2013  . Kidney stones    Dr. Rogers Blocker  . OSA (obstructive sleep apnea)    supplemental oxygen at night  . Oxygen dependent    hs  . [place] spotted fever 12/10/2017   Positve IgG in titer on 11/20/17  . S/P cardiac cath 1998    a. no obstructive disease  . Syncope and collapse   . Temporary low platelet count ([place])     Surgical History: Past Surgical History:  Procedure Laterality Date  . ANAL FISSURE REPAIR    . BACK SURGERY  1977   rupture disc lumbar spine  . [facility].   Marland Kitchen CATARACT EXTRACTION W/PHACO Right 07/16/2016   Procedure: CATARACT EXTRACTION PHACO AND INTRAOCULAR LENS PLACEMENT (IOC);  Surgeon: [name], MD;  Location: [facility] ORS;  Service: Ophthalmology;  Laterality: Right;  [place] 01:11 AP% 17.6 CDE 24.83 fluid pack lot # [id number]  . CATARACT EXTRACTION W/PHACO Left 08/13/2016   Procedure: CATARACT EXTRACTION PHACO AND INTRAOCULAR LENS PLACEMENT ([place]) suture placed in left eye at end of procedure;  Surgeon: [name], MD;  Location: [facility] ORS;  Service: Ophthalmology;  Laterality: Left;  [place] 01:55 AP% 22.7 CDE 53.56 fluid pack lot # [id number]  . FINGER AMPUTATION     partial  . KNEE SURGERY  1993   arthroscopy  . SHOULDER SURGERY Bilateral 1998   arthroscopic right, rotator cuff repair left    Home Medications:  Allergies as of 05/03/2020      Reactions   Bee Venom Swelling   Dye Fdc Red [red Dye] Swelling, Other (See Comments)   Reaction: gout   Atenolol Other (See Comments)   Did not regulate blood pressure   Benadryl [  diphenhydramine Hcl (sleep)] Other (See Comments)   Hyperactivity   Enalapril Itching   itching   Gabapentin Palpitations, Rash   rash   Hctz [hydrochlorothiazide] Other (See Comments)   Decreased potassium      Medication List       Accurate as of May 03, 2020 10:34 AM. If you have any questions, ask your nurse or doctor.        allopurinol 300 MG tablet Commonly known as: ZYLOPRIM TAKE 1 TABLET(300 MG) BY MOUTH DAILY   aspirin 81 MG tablet Take 1 tablet (81 mg total) by mouth daily.   BD Pen Needle Nano 2nd Gen 32G X 4 MM Misc Generic drug: Insulin Pen  Needle USE DAILY   calcitRIOL 0.25 MCG capsule Commonly known as: ROCALTROL Take 0.25 mcg by mouth daily.   carvedilol 6.25 MG tablet Commonly known as: COREG Take 1 tablet (6.25 mg total) by mouth 2 (two) times daily with a meal.   clove oil liquid Apply 1 application topically as needed.   doxazosin 4 MG tablet Commonly known as: CARDURA Take 0.5 tablets (2 mg total) by mouth daily.   DULoxetine 60 MG capsule Commonly known as: CYMBALTA TAKE 1 CAPSULE(60 MG) BY MOUTH DAILY   esomeprazole 40 MG capsule Commonly known as: NexIUM Take 1 capsule (40 mg total) by mouth daily at 12 noon. What changed: when to take this   finasteride 5 MG tablet Commonly known as: PROSCAR TAKE 1 TABLET(5 MG) BY MOUTH AT BEDTIME   fluticasone 50 MCG/ACT nasal spray Commonly known as: FLONASE Place 2 sprays into both nostrils daily.   FreeStyle Freedom Lite w/Device Kit Use as directed   FREESTYLE LITE test strip Generic drug: glucose blood CHECK BLOOD SUGAR TWICE DAILY.   furosemide 40 MG tablet Commonly known as: LASIX TAKE 1 TABLET(40 MG) BY MOUTH DAILY   HYDROcodone-acetaminophen 5-325 MG tablet Commonly known as: NORCO/VICODIN Take 1 tablet by mouth every 6 (six) hours as needed for moderate pain.   levothyroxine 100 MCG tablet Commonly known as: SYNTHROID TAKE 1 TABLET(100 MCG) BY MOUTH DAILY BEFORE BREAKFAST   loratadine 10 MG tablet Commonly known as: CLARITIN Take 10 mg by mouth daily as needed for allergies.   losartan 25 MG tablet Commonly known as: COZAAR Take 1 tablet QAM, 2 tablets QPM   Magnesium 250 MG Tabs Take 1 tablet by mouth daily.   metFORMIN 500 MG 24 hr tablet Commonly known as: GLUCOPHAGE-XR Take 1 tablet (500 mg) by mouth in the morning, take 2 tablets (1000 mg) by mouth in the evening   Ozempic (0.25 or 0.5 MG/DOSE) 2 MG/1.5ML Sopn Generic drug: Semaglutide(0.25 or 0.5MG/DOS) Inject 0.5 mg into the skin once a week.   pregabalin 100 MG  capsule Commonly known as: LYRICA TAKE 2 CAPSULES BY MOUTH TWICE DAILY   simvastatin 40 MG tablet Commonly known as: ZOCOR TAKE 1 TABLET(40 MG) BY MOUTH AT BEDTIME   tadalafil 20 MG tablet Commonly known as: CIALIS Take by mouth.   testosterone cypionate 200 MG/ML injection Commonly known as: DEPOTESTOSTERONE CYPIONATE Inject 200 mg into the muscle every 14 (fourteen) days.   Tyler Aas FlexTouch 100 UNIT/ML FlexTouch Pen Generic drug: insulin degludec Inject 48 Units into the skin daily.       Allergies:  Allergies  Allergen Reactions  . Bee Venom Swelling  . Dye Fdc Red [Red Dye] Swelling and Other (See Comments)    Reaction: gout  . Atenolol Other (See Comments)    Did  not regulate blood pressure  . Benadryl [Diphenhydramine Hcl (Sleep)] Other (See Comments)    Hyperactivity   . Enalapril Itching    itching  . Gabapentin Palpitations and Rash    rash  . Hctz [Hydrochlorothiazide] Other (See Comments)    Decreased potassium    Family History: Family History  Problem Relation Age of Onset  . Breast cancer Mother   . Lung cancer Mother   . Bone cancer Mother   . Heart Problems Mother   . Cancer Mother        breast, lung and rib  . AAA (abdominal aortic aneurysm) Mother   . Heart disease Mother   . Heart attack Father   . Heart disease Father   . Cancer Brother        esophageal  . Heart attack Brother   . Colon cancer Neg Hx     Social History:  reports that he has never smoked. He has never used smokeless tobacco. He reports that he does not drink alcohol and does not use drugs.   Physical Exam: BP (!) 171/81   Pulse 84   Ht _0  (1.803 m)   Wt 269 lb (122 kg)   BMI 37.52 kg/m   Constitutional:  Alert and oriented, No acute distress. HEENT: [place] AT, moist mucus membranes.  Trachea midline, no masses. Cardiovascular: No clubbing, cyanosis, or edema. Respiratory: Normal respiratory effort, no increased work of breathing. Skin: No rashes, bruises  or suspicious lesions. Neurologic: Grossly intact, no focal deficits, moving all 4 extremities. Psychiatric: Normal mood and affect.    Pertinent Imaging: Ultrasound images were personally reviewed and interpreted  Ultrasound renal complete  Narrative CLINICAL DATA:  Follow-up left renal mass  EXAM: RENAL / URINARY TRACT ULTRASOUND COMPLETE  COMPARISON:  08/01/2019  FINDINGS: Right Kidney:  Renal measurements: 10.9 x 6.4 x 6.0 cm. = volume: 217 mL. No mass lesion or hydronephrosis is noted. Bilobed partially septated cystic lesion is noted in the lower pole which measures 5.5 x 4.4 x 4.1 cm. This is similar in appearance to that seen on prior CT as well as prior ultrasound examination. A smaller 2 cm cyst is noted in the midportion of the right kidney.  Left Kidney:  Renal measurements: 10.9 x 5.7 x 5.1 cm. = volume: 168 mL. 3.0 cm solid lesion is noted in the medial aspect of the left upper pole similar to that seen on the prior exam. Additionally a mildly septated cyst is noted in the lower pole of the left kidney stable in appearance from the prior exam.  Bladder:  Appears normal for degree of bladder distention.  Other:  None.  IMPRESSION: When compared with the prior exam the solid lesion in the upper pole of the left kidney is stable in appearance.  Mildly complicated and simple cysts in the kidneys bilaterally stable from the previous exam.   Electronically Signed By: Inez Catalina M.D. On: 04/30/2020 15:14    Assessment & Plan:    1.  Left renal mass  Stable solid left renal mass  We again discussed there is a >70% chance this is a renal cell carcinoma however the risk of metastasis is low or masses less than 3-4 cm  Options were again reviewed including continued surveillance, renal mass biopsy, percutaneous cryoablation and laparoscopic removal  He would like to continue surveillance for now  Schedule follow-up abdominal MRI 9  months  2.  BPH with LUTS  Stable on combination therapy  Abbie Sons, [place] [address] [place], [place] [address] [phone]

## 2020-05-04 ENCOUNTER — Ambulatory Visit: Payer: Medicare Other | Admitting: Urology

## 2020-05-04 ENCOUNTER — Encounter: Payer: Self-pay | Admitting: Urology

## 2020-05-08 ENCOUNTER — Other Ambulatory Visit: Payer: Self-pay | Admitting: Family Medicine

## 2020-05-08 ENCOUNTER — Other Ambulatory Visit: Payer: Self-pay | Admitting: Internal Medicine

## 2020-05-08 DIAGNOSIS — Z760 Encounter for issue of repeat prescription: Secondary | ICD-10-CM

## 2020-05-11 ENCOUNTER — Ambulatory Visit: Payer: Medicare Other | Admitting: Pharmacist

## 2020-05-11 DIAGNOSIS — G8929 Other chronic pain: Secondary | ICD-10-CM

## 2020-05-11 DIAGNOSIS — I10 Essential (primary) hypertension: Secondary | ICD-10-CM

## 2020-05-11 DIAGNOSIS — E785 Hyperlipidemia, unspecified: Secondary | ICD-10-CM

## 2020-05-11 DIAGNOSIS — M1A3790 Chronic gout due to renal impairment, unspecified ankle and foot, without tophus (tophi): Secondary | ICD-10-CM

## 2020-05-11 DIAGNOSIS — N1831 Chronic kidney disease, stage 3a: Secondary | ICD-10-CM

## 2020-05-11 DIAGNOSIS — E1142 Type 2 diabetes mellitus with diabetic polyneuropathy: Secondary | ICD-10-CM

## 2020-05-11 MED ORDER — LANTUS SOLOSTAR 100 UNIT/ML ~~LOC~~ SOPN: 52.0000 [IU] | PEN_INJECTOR | Freq: Every day | SUBCUTANEOUS | 11 refills | Status: DC

## 2020-05-11 NOTE — Patient Instructions (Signed)
Visit Information  Goals Addressed              This Visit's Progress     Patient Stated   .  Medication Monitoring (pt-stated)        Patient Goals/Self-Care Activities . Over the next 90 days, patient will:  - take medications as prescribed check blood glucose TID using CGM, document, and provide at future appointments check blood pressure BID, document, and provide at future appointments         Patient verbalizes understanding of instructions provided today and agrees to view in [place].  Plan: Telephone follow up appointment with care management team member scheduled for:  ~ 6 weeks  Catie Darnelle Maffucci, PharmD, [place], [place] Clinical Pharmacist [employer] [facility] [employer] [phone]

## 2020-05-11 NOTE — Chronic Care Management (AMB) (Signed)
Chronic Care Management Pharmacy Note  05/11/2020 Name:  Douglas [name] MRN:  [id number] DOB:  02/09/1946  Subjective: Douglas [name] is an 75 y.o. year old male who is a primary patient of [name], [name], MD.  The CCM team was consulted for assistance with disease management and care coordination needs.    Engaged with patient by telephone for follow up visit in response to provider referral for pharmacy case management and/or care coordination services.   Consent to Services:  The patient was given information about Chronic Care Management services, agreed to services, and gave verbal consent prior to initiation of services.  Please see initial visit note for detailed documentation.   Objective:  Lab Results  Component Value Date   CREATININE 1.45 06/10/2019   CREATININE 1.49 (H) 01/19/2019   CREATININE 1.54 (H) 11/12/2018    Lab Results  Component Value Date   HGBA1C 7.9 (H) 03/02/2020       Component Value Date/Time   CHOL 115 11/23/2019 1350   CHOL 142 11/12/2018 1142   TRIG 174.0 (H) 11/23/2019 1350   HDL 40.80 11/23/2019 1350   HDL 49 11/12/2018 1142   CHOLHDL 3 11/23/2019 1350   VLDL 34.8 11/23/2019 1350   LDLCALC 40 11/23/2019 1350   LDLCALC 58 11/12/2018 1142      BP Readings from Last 3 Encounters:  05/03/20 (!) 171/81  03/28/20 (!) 144/79  03/02/20 (!) 150/80    Assessment: Review of patient past medical history, allergies, medications, health status, including review of consultants reports, laboratory and other test data, was performed as part of comprehensive evaluation and provision of chronic care management services.   SDOH:  (Social Determinants of Health) assessments and interventions performed:  SDOH Interventions   Flowsheet Row Most Recent Value  SDOH Interventions   Financial Strain Interventions Other (Comment)  [manufacturer assistance]      CCM Care Plan  Allergies  Allergen Reactions  . Bee Venom Swelling  . Dye  Fdc Red [Red Dye] Swelling and Other (See Comments)    Reaction: gout  . Atenolol Other (See Comments)    Did not regulate blood pressure  . Benadryl [Diphenhydramine Hcl (Sleep)] Other (See Comments)    Hyperactivity   . Enalapril Itching    itching  . Gabapentin Palpitations and Rash    rash  . Hctz [Hydrochlorothiazide] Other (See Comments)    Decreased potassium    Medications Reviewed Today    Reviewed by De Hollingshead, RPH-CPP (Pharmacist) on 05/11/20 at 1003  Med List Status: <None>  Medication Order Taking? Sig Documenting Provider Last Dose Status Informant  allopurinol (ZYLOPRIM) 300 MG tablet [id number] Yes TAKE 1 TABLET(300 MG) BY MOUTH DAILY [name], MD Taking Active   aspirin 81 MG tablet [id number] Yes Take 1 tablet (81 mg total) by mouth daily. Wellington Hampshire, MD Taking Active Self  BD PEN NEEDLE NANO 2ND GEN 32G X 4 MM MISC [id number] Yes USE DAILY [name], MD Taking Active   Blood Glucose Monitoring Suppl (FREESTYLE FREEDOM LITE) W/DEVICE KIT [id number] Yes Use as directed Jackolyn Confer, MD Taking Active Self  calcitRIOL (ROCALTROL) 0.25 MCG capsule [id number] Yes Take 0.25 mcg by mouth daily.  [provider] Taking Active Self  carvedilol (COREG) 6.25 MG tablet [id number] Yes Take 1 tablet (6.25 mg total) by mouth 2 (two) times daily with a meal. [name], MD Taking Active   clove oil liquid [id number] Yes Apply 1 application  topically as needed. [provider] Taking Active Self  doxazosin (CARDURA) 4 MG tablet [id number] Yes Take 0.5 tablets (2 mg total) by mouth daily. [name], MD Taking Active   DULoxetine (CYMBALTA) 60 MG capsule [id number] Yes TAKE 1 CAPSULE(60 MG) BY MOUTH DAILY [name], MD Taking Active   esomeprazole (NEXIUM) 40 MG capsule [id number] Yes Take 1 capsule (40 mg total) by mouth daily at 12 noon.  Patient taking differently: Take 40 mg by mouth daily.   Jackolyn Confer, MD Taking Active   finasteride (PROSCAR) 5 MG tablet [id number] Yes TAKE 1 TABLET(5 MG) BY MOUTH AT BEDTIME Stoioff, Ronda Fairly, MD Taking Active   fluticasone (FLONASE) 50 MCG/ACT nasal spray [id number] Yes Place 2 sprays into both nostrils daily. [name], MD Taking Active   FREESTYLE LITE test strip [id number] Yes CHECK BLOOD SUGAR TWICE DAILY. [name], MD Taking Active   furosemide (LASIX) 40 MG tablet [id number] Yes TAKE 1 TABLET(40 MG) BY MOUTH DAILY Einar Pheasant, MD Taking Active   HYDROcodone-acetaminophen (NORCO/VICODIN) 5-325 MG tablet [id number] Yes Take 1 tablet by mouth every 6 (six) hours as needed for moderate pain. [name], MD Taking Active   insulin degludec [facility]) 100 UNIT/ML FlexTouch Pen [id number] Yes Inject 48 Units into the skin daily. [name], MD Taking Active            Med Note Nat Christen Mar 22, 2020  1:36 PM) Lantus 52 units  levothyroxine (SYNTHROID) 100 MCG tablet [id number] Yes TAKE 1 TABLET(100 MCG) BY MOUTH DAILY BEFORE BREAKFAST [name], MD Taking Active   loratadine (CLARITIN) 10 MG tablet [id number] No Take 10 mg by mouth daily as needed for allergies.   Patient not taking: Reported on 05/11/2020   [provider] Not Taking Active Self  losartan (COZAAR) 25 MG tablet [id number] Yes Take 1 tablet QAM, 2 tablets QPM [name], MD Taking Active   Magnesium 250 MG TABS [id number]  Take 1 tablet by mouth daily.  [provider]  Active Self  metFORMIN (GLUCOPHAGE-XR) 500 MG 24 hr tablet [id number] Yes Take 1 tablet (500 mg) by mouth in the morning, take 2 tablets (1000 mg) by mouth in the evening [name], MD Taking Active   pregabalin (LYRICA) 100 MG capsule [id number] Yes TAKE 2 CAPSULES BY MOUTH TWICE DAILY [name], MD Taking Active   Semaglutide,0.25 or 0.5MG/DOS, (OZEMPIC, 0.25 OR 0.5 MG/DOSE,) 2 MG/1.5ML SOPN [id number] No Inject 0.5 mg into the  skin once a week.  Patient not taking: Reported on 05/11/2020   [provider] Not Taking Active   simvastatin (ZOCOR) 40 MG tablet [id number] Yes TAKE 1 TABLET(40 MG) BY MOUTH AT BEDTIME [name], MD Taking Active   tadalafil (CIALIS) 20 MG tablet [id number] Yes Take by mouth. [provider] Taking Active   testosterone cypionate (DEPOTESTOSTERONE CYPIONATE) 200 MG/ML injection [id number] Yes Inject 200 mg into the muscle every 14 (fourteen) days. [provider] Taking Active           Patient Active Problem List   Diagnosis Date Noted  . Skin exam, screening for cancer 03/02/2020  . Anemia in chronic kidney disease 02/18/2019  . Secondary hyperparathyroidism of renal origin ([place]) 02/18/2019  . Right kidney mass 01/20/2019  . Erectile dysfunction due to arterial insufficiency 11/09/2018  . Respiratory illness 11/05/2018  . Tick bite of knee, initial encounter 08/17/2018  .  Left shoulder pain 07/02/2018  . Allergic rhinitis 07/02/2018  . Cervical radiculopathy 11/20/2017  . Toe pain, bilateral 11/20/2017  . Abdominal pain 02/18/2017  . Clavicle enlargement 02/18/2017  . Anxiety 02/18/2017  . Degenerative arthritis of knee, bilateral 09/04/2016  . BPH (benign prostatic hyperplasia) 08/18/2016  . Knee osteoarthritis 08/18/2016  . Hyperlipidemia 05/20/2016  . Essential hypertension 05/31/2015  . Low testosterone 02/27/2015  . Diabetic neuropathy ([place]) 01/05/2013  . Obesity (BMI 30-39.9) 01/05/2013  . Chronic back pain greater than 3 months duration 01/05/2013  . Gout 01/05/2013  . OSA (obstructive sleep apnea) 01/05/2013  . Chronic kidney disease 01/04/2013  . DM type 2 with diabetic peripheral neuropathy ([place]) 01/04/2013  . Hypothyroidism 01/04/2013    Conditions to be addressed/monitored: HTN, HLD, DMII and CKD  Care Plan : Medication Management  Updates made by De Hollingshead, RPH-CPP since 05/11/2020 12:00 AM    Problem:  Diabetes, Hypertension     Long-Range Goal: Disease Progression Prevention   Start Date: 01/11/2020  Recent Progress: On track  Priority: High  Note:   Current Barriers:  . Unable to independently afford treatment regimen . Unable to achieve control of diabetes   Pharmacist Clinical Goal(s):  Marland Kitchen Over the next 90 days, patient will verbalize ability to afford treatment regimen. . Over the next 90 days, patient will achieve control of diabetes as evidenced by improvement in A1c through collaboration with PharmD and provider.   Interventions: . 1:1 collaboration with [name], MD regarding development and update of comprehensive plan of care as evidenced by provider attestation and co-signature . Inter-disciplinary care team collaboration (see longitudinal plan of care) . Comprehensive medication review performed; medication list updated in electronic medical record  Diabetes: . Uncontrolled; current treatment: Ozempic 0.5 mg weekly; Lantus 52 units daily (will transition to [place] in the future via patient assistance), metformin XR 500 mg QAM, 1000 mg QPM (max tolerated dose d/t GI upset) . Completed current Ozempic supply. Cannot afford to refill.  . Reports steroid injection in his back last week, resulted in higher glucose readings. Notes that he is being woken up by a high glucose alarm every evening.  . Current glucose readings:  Date of Download: 05/11/20 % Time CGM is active: 85%; scans per day: 12 Average Glucose: 185 mg/dL; 188-> 149 -> 185 -> 212; last 7 days  Time in Goal:  - Time in range 70-180: 49% - Time above range: 51% - Time below range: 0% Observed patterns: lows around 3 am before steroid injection  . Reviewed how to silence high glucose alarms.  . Patient requests script for Lantus vials, as he believes this was more affordable on his insurance plan. Per Epic, pen is cheaper. today.  . Patient's income should now meet eligibility for [employer] patient  assistance. Will collaborate w/ CPhT to pursue assistance for Ozempic 1 mg weekly, [name] U200 20 units daily . Will collaborate with office staff to procure Ozempic sample for patient to continue 0.5 mg weekly until patient assistance supply can be obtained . Continue scanning glucose at least TID.   Hypertension: . Variable but generally trending towards control; current treatment: losartan 25 mg QAM, 50 mg QPM, doxazosin 2 mg QPM, carvedilol 6.25 mg BID; furosemide 40 mg QAM . Confirms accuracy of home cuff - reports that home cuff was accurate with office manual measurement o Notes dizziness w/ higher dose of doxazosin o Afternoon dizziness/sleepiness w/ losartan 50 mg dose in the morning o Hx  hypokalemia w/ HCTZ o Itching w/ enalapril o Reported lack of benefit w/ generic atenolol o No hx spironolactone.  . Current BP readings:  SBP DBP HR  20-Jan 151 74 77  21-Jan 137 79 77  22-Jan 126 72 80  23-Jan 111 64 87  24-Jan 152 81 80  25-Jan 138 82 75  26-Jan 162 88 75   139 77 78   . Notes that positioning of cuff to avoid messing with Libre sensor has related to fluctuations in BP. Also notes that he can tell that when he has bigger meals or higher salt meals, next morning BP is higher.  . Denies any episodes of dizziness/lightheadedness signalling hypotension . Discussed minimizing sodium intake. Could consider carvedilol titration moving forward. . Recommend to continue current regimen at this time along with home monitoring. Provide readings at next PCP appointment.    Hyperlipidemia: . Controlled per last lab work; current treatment: simvastatin 40 mg daily . Recommended to continue current regimen  Peripheral Neuropathy, Chronic Back Pain: . Appropriately controlled at this time, but notes an episode of significant pain prior to back injection; current treatment: duloxetine 60 mg daily, pregabalin 200 mg BID, hydocodone-APAP 5/325 mg PRN (reports that a prescription for 30  tablets will last about a year), topical clove oil and Salonpas; PT for mobility support; cyclobenzaprine 10 mg PRN  . Reports improvement s/p steroid injection last week.  . Encouraged to continue current regimen. Reviewed goals of reduction in opioid use through non-opioid medication . Patient asked if any other treatments for peripheral neuropathy. Patient is at maximum recommended doses of duloxetine and pregabalin for diabetic neuropathy or neuropathic pain, given renal function. Does not appear patient has tried amitriptyline previously. Could consider a trial in the future if increased need for PRN opioids. Recommend to continue current regimen at this time  CKD: . Stable. Current treatment: calcitriol 0.25 mg daily. Follows w/ Dr. Holley Raring . Encouraged to continue current regimen and collaboration w/ nephrology at this time . Reviewed importance of continuing focus on achieving and maintaining goal BP and glycemic control  Gout: . Controlled, patient has not had a gout flare since initial diagnosis; current treatment: allopurinol 300 mg daily . Wonders if he still needs to take this medication. Discussed that we could consider reducing dose and checking follow up uric acid level. However, patient decided he didn't want to risk a gout flare. Reviewed that no concern for long term allopurinol use. Continue current regimen at this time  Patient Goals/Self-Care Activities . Over the next 90 days, patient will:  - take medications as prescribed check glucose TID using CGM, document, and provide at future appointments check blood pressure daily, document, and provide at future appointments collaborate with provider on medication access solutions   Follow Up Plan: Face to Face appointment with care management team member scheduled for:  ~ 6 weeks     Medication Assistance: Application for Novo Nordisk  medication assistance program. in process.  Anticipated assistance start date TBD.  See  plan of care for additional detail.  Follow Up:  Patient agrees to Care Plan and Follow-up.  Plan: Telephone follow up appointment with care management team member scheduled for:  ~ 6 weeks  Catie Darnelle Maffucci, PharmD, [place], [place] Clinical Pharmacist [employer] [facility] [employer] [phone]

## 2020-05-14 ENCOUNTER — Telehealth: Payer: Self-pay | Admitting: Pharmacist

## 2020-05-14 NOTE — Telephone Encounter (Signed)
Medication Samples have been labeled and logged for the patient. He will come by and pick up at his earliest convenience.   Drug: Ozempic Strength: 0.25/0.5 mg Qty: 1 pen LOT: [id number] Exp.Date: 09/2022

## 2020-05-23 DIAGNOSIS — E221 Hyperprolactinemia: Secondary | ICD-10-CM | POA: Diagnosis not present

## 2020-05-23 DIAGNOSIS — N529 Male erectile dysfunction, unspecified: Secondary | ICD-10-CM | POA: Diagnosis not present

## 2020-05-23 DIAGNOSIS — E230 Hypopituitarism: Secondary | ICD-10-CM | POA: Diagnosis not present

## 2020-05-23 DIAGNOSIS — Z79899 Other long term (current) drug therapy: Secondary | ICD-10-CM | POA: Diagnosis not present

## 2020-06-01 ENCOUNTER — Other Ambulatory Visit: Payer: Self-pay

## 2020-06-05 ENCOUNTER — Encounter: Payer: Self-pay | Admitting: Family Medicine

## 2020-06-05 ENCOUNTER — Other Ambulatory Visit: Payer: Self-pay

## 2020-06-05 ENCOUNTER — Ambulatory Visit (INDEPENDENT_AMBULATORY_CARE_PROVIDER_SITE_OTHER): Payer: Medicare Other | Admitting: Family Medicine

## 2020-06-05 VITALS — BP 138/84 | HR 71 | Temp 97.0°F | Ht 71.0 in | Wt 268.2 lb

## 2020-06-05 DIAGNOSIS — I10 Essential (primary) hypertension: Secondary | ICD-10-CM | POA: Diagnosis not present

## 2020-06-05 DIAGNOSIS — N2889 Other specified disorders of kidney and ureter: Secondary | ICD-10-CM

## 2020-06-05 DIAGNOSIS — E1142 Type 2 diabetes mellitus with diabetic polyneuropathy: Secondary | ICD-10-CM | POA: Diagnosis not present

## 2020-06-05 LAB — BASIC METABOLIC PANEL
BUN: 16 mg/dL (ref 6–23)
CO2: 32 mEq/L (ref 19–32)
Calcium: 9.4 mg/dL (ref 8.4–10.5)
Chloride: 98 mEq/L (ref 96–112)
Creatinine, Ser: 1.51 mg/dL — ABNORMAL HIGH (ref 0.40–1.50)
GFR: 45.05 mL/min — ABNORMAL LOW (ref >60.00)
Glucose, Bld: 175 mg/dL — ABNORMAL HIGH (ref 70–99)
Potassium: 4.2 mEq/L (ref 3.5–5.1)
Sodium: 137 mEq/L (ref 135–145)

## 2020-06-05 LAB — HEMOGLOBIN A1C: Hgb A1c MFr Bld: 7.6 % — ABNORMAL HIGH (ref 4.6–6.5)

## 2020-06-05 MED ORDER — HYDROCODONE-ACETAMINOPHEN 5-325 MG PO TABS: 1.0000 | ORAL_TABLET | Freq: Four times a day (QID) | ORAL | 0 refills | Status: DC | PRN

## 2020-06-05 MED ORDER — FREESTYLE LIBRE 14 DAY SENSOR MISC
1 refills | Status: DC
Start: 2020-06-05 — End: 2020-07-03

## 2020-06-05 NOTE — Patient Instructions (Signed)
Nice to see you. We will work on your patient assistance applications. We will get lab work today and contact you with the results.

## 2020-06-05 NOTE — Progress Notes (Signed)
Douglas Rumps, MD Phone: [phone]  Douglas [name] is a 75 y.o. male who presents today for follow-up.  Hypertension: Patient notes his blood pressure does go up after he takes his testosterone injection though eventually tapers down to 120/79.  Oftentimes gets to 150s over 80s.  Rarely into the 180s over 90s.  Taking Cardura, Lasix, losartan, and carvedilol.  No chest pain or shortness of breath.  Diabetes: Patient has been out of his Lantus over the last week.  He has also been out of the [place] since January.  He is taking Metformin 1000 mg twice daily.  Stable polyuria and polydipsia.  Was having nighttime hypoglycemic episodes when he was on Lantus, Metformin, and Ozempic though that has improved.  He checks his blood glucose with his freestyle libre more than 4 times daily.  Neuropathy: This is stable.  Lyrica is beneficial.  He very rarely takes Vicodin.  He will typically go with Salonpas or clove oil which are beneficial.  He does follow intermittently with podiatry.  He does report an ingrown toenail recently which he dug out on his own with relief of pain.  Kidney lesion: Patient notes he has an MRI scheduled to evaluate this further.  Social History   Tobacco Use  Smoking Status Never Smoker  Smokeless Tobacco Never Used    Current Outpatient Medications on File Prior to Visit  Medication Sig Dispense Refill  . allopurinol (ZYLOPRIM) 300 MG tablet TAKE 1 TABLET(300 MG) BY MOUTH DAILY 90 tablet 1  . aspirin 81 MG tablet Take 1 tablet (81 mg total) by mouth daily.    . BD PEN NEEDLE NANO 2ND GEN 32G X 4 MM MISC USE DAILY 100 each 1  . Blood Glucose Monitoring Suppl (FREESTYLE FREEDOM LITE) W/DEVICE KIT Use as directed 1 each 0  . calcitRIOL (ROCALTROL) 0.25 MCG capsule Take 0.25 mcg by mouth daily.   5  . carvedilol (COREG) 6.25 MG tablet Take 1 tablet (6.25 mg total) by mouth 2 (two) times daily with a meal. 180 tablet 3  . clove oil liquid Apply 1 application  topically as needed.    . doxazosin (CARDURA) 4 MG tablet Take 0.5 tablets (2 mg total) by mouth daily. 90 tablet 1  . DULoxetine (CYMBALTA) 60 MG capsule TAKE 1 CAPSULE(60 MG) BY MOUTH DAILY 90 capsule 1  . esomeprazole (NEXIUM) 40 MG capsule Take 1 capsule (40 mg total) by mouth daily at 12 noon. (Patient taking differently: Take 40 mg by mouth daily.) 90 capsule 3  . finasteride (PROSCAR) 5 MG tablet TAKE 1 TABLET(5 MG) BY MOUTH AT BEDTIME 90 tablet 3  . fluticasone (FLONASE) 50 MCG/ACT nasal spray Place 2 sprays into both nostrils daily. 16 g 6  . FREESTYLE LITE test strip CHECK BLOOD SUGAR TWICE DAILY. 200 strip 1  . furosemide (LASIX) 40 MG tablet TAKE 1 TABLET(40 MG) BY MOUTH DAILY 90 tablet 0  . levothyroxine (SYNTHROID) 100 MCG tablet TAKE 1 TABLET(100 MCG) BY MOUTH DAILY BEFORE BREAKFAST 90 tablet 3  . loratadine (CLARITIN) 10 MG tablet Take 10 mg by mouth daily as needed for allergies.    Marland Kitchen losartan (COZAAR) 25 MG tablet Take 1 tablet QAM, 2 tablets QPM 270 tablet 1  . Magnesium 250 MG TABS Take 1 tablet by mouth daily.     . metFORMIN (GLUCOPHAGE-XR) 500 MG 24 hr tablet Take 1 tablet (500 mg) by mouth in the morning, take 2 tablets (1000 mg) by mouth in the evening 270  tablet 3  . pregabalin (LYRICA) 100 MG capsule TAKE 2 CAPSULES BY MOUTH TWICE DAILY 360 capsule 1  . Semaglutide,0.25 or 0.5MG/DOS, (OZEMPIC, 0.25 OR 0.5 MG/DOSE,) 2 MG/1.5ML SOPN Inject 0.5 mg into the skin once a week.    . simvastatin (ZOCOR) 40 MG tablet TAKE 1 TABLET(40 MG) BY MOUTH AT BEDTIME 90 tablet 3  . tadalafil (CIALIS) 20 MG tablet Take by mouth.    . testosterone cypionate (DEPOTESTOSTERONE CYPIONATE) 200 MG/ML injection Inject 200 mg into the muscle every 14 (fourteen) days.    . insulin glargine (LANTUS SOLOSTAR) 100 UNIT/ML Solostar Pen Inject 52 Units into the skin daily. 15 mL 11   No current facility-administered medications on file prior to visit.     ROS see history of present  illness  Objective  Physical Exam Vitals:   06/05/20 1034  BP: 138/84  Pulse: 71  Temp: (!) 97 F (36.1 C)    BP Readings from Last 3 Encounters:  06/05/20 138/84  05/03/20 (!) 171/81  03/28/20 (!) 144/79   Wt Readings from Last 3 Encounters:  06/05/20 268 lb 4 oz (121.7 kg)  05/03/20 269 lb (122 kg)  03/02/20 282 lb (127.9 kg)    Physical Exam Constitutional:      General: He is not in acute distress.    Appearance: He is not diaphoretic.  Cardiovascular:     Rate and Rhythm: Normal rate and regular rhythm.     Heart sounds: Normal heart sounds.  Pulmonary:     Effort: Pulmonary effort is normal.     Breath sounds: Normal breath sounds.  Musculoskeletal:        General: No edema.     Right lower leg: No edema.     Left lower leg: No edema.  Skin:    General: Skin is warm and dry.  Neurological:     Mental Status: He is alert.    Diabetic Foot Exam - Simple   Simple Foot Form Diabetic Foot exam was performed with the following findings: Yes 06/05/2020 10:55 AM  Visual Inspection See comments: Yes Sensation Testing See comments: Yes Pulse Check Posterior Tibialis and Dorsalis pulse intact bilaterally: Yes Comments Left great toe tibial aspect along the edge of the toenail with evidence of removal of ingrown toenail, there is no surrounding signs of infection such as erythema, purulent drainage, or tenderness, slight callus on the left great toe tip, no other deformities, ulcerations, or skin breakdown, sensation to light touch intact though monofilament testing absent or decreased throughout his feet     Assessment/Plan: Please see individual problem list.  Problem List Items Addressed This Visit    Diabetic neuropathy ([place])    He will continue Lyrica. He can continue as needed Vicodin use. He uses this very rarely. Controlled substance database reviewed. I advised him against doing any work on his own feet given risk of infection and delayed healing with  his diabetes. I encouraged him to follow-up with his podiatrist.      Relevant Medications   HYDROcodone-acetaminophen (NORCO/VICODIN) 5-325 MG tablet   DM type 2 with diabetic peripheral neuropathy ([place]) - Primary    Uncontrolled. Check an A1c today. He was given samples of Ozempic. Our clinical pharmacist is working on patient assistance for insulin and the Ozempic. He will continue on Ozempic 0.5 mg once weekly and Metformin 1000 mg twice daily.      Relevant Medications   Continuous Blood Gluc Sensor (FREESTYLE LIBRE 14 DAY SENSOR) MISC  Other Relevant Orders   HgB A1c (Completed)   Basic Metabolic Panel (BMET) (Completed)   Essential hypertension    Generally well controlled though blood pressure does increase with his testosterone supplementation. Discussed that he would need to monitor this closely. If it remains elevated he may have to discontinue testosterone supplementation. He will continue Cardura 4 mg once daily, carvedilol 6.25 mg twice daily, Lasix 40 mg daily, and losartan 25 mg daily. Labs as outlined.      Right kidney mass    He will keep his appointment for the MRI him continue to follow with urology.          This visit occurred during the SARS-CoV-2 public health emergency.  Safety protocols were in place, including screening questions prior to the visit, additional usage of staff PPE, and extensive cleaning of exam room while observing appropriate contact time as indicated for disinfecting solutions.    Douglas Rumps, MD [place]

## 2020-06-05 NOTE — Assessment & Plan Note (Signed)
He will keep his appointment for the MRI him continue to follow with urology.

## 2020-06-05 NOTE — Assessment & Plan Note (Signed)
Uncontrolled. Check an A1c today. He was given samples of Ozempic. Our clinical pharmacist is working on patient assistance for insulin and the Ozempic. He will continue on Ozempic 0.5 mg once weekly and Metformin 1000 mg twice daily.

## 2020-06-05 NOTE — Assessment & Plan Note (Addendum)
He will continue Lyrica. He can continue as needed Vicodin use. He uses this very rarely. Controlled substance database reviewed. I advised him against doing any work on his own feet given risk of infection and delayed healing with his diabetes. I encouraged him to follow-up with his podiatrist.

## 2020-06-05 NOTE — Assessment & Plan Note (Signed)
Generally well controlled though blood pressure does increase with his testosterone supplementation. Discussed that he would need to monitor this closely. If it remains elevated he may have to discontinue testosterone supplementation. He will continue Cardura 4 mg once daily, carvedilol 6.25 mg twice daily, Lasix 40 mg daily, and losartan 25 mg daily. Labs as outlined.

## 2020-06-07 ENCOUNTER — Other Ambulatory Visit: Payer: Self-pay | Admitting: Family Medicine

## 2020-06-07 NOTE — Progress Notes (Signed)
Lab results mailed to patient per patient request.  Keiri Solano,cma

## 2020-06-11 ENCOUNTER — Telehealth: Payer: Self-pay | Admitting: Family Medicine

## 2020-06-11 ENCOUNTER — Encounter: Payer: Self-pay | Admitting: Family Medicine

## 2020-06-11 NOTE — Telephone Encounter (Signed)
Patient called in stated that he tested positive for covid and he wanted to a prescription for plexlovid to prevent blood clots

## 2020-06-11 NOTE — Telephone Encounter (Signed)
Patient called back to follow up. Stated he has not heard anything and was concern, because he will run out of medication today.

## 2020-06-11 NOTE — Telephone Encounter (Signed)
Douglas [name]  I called and spoke with patient  Symptoms started 5 days ago, improving.  Covid test positive  5 days ago.  Symptoms started with nasal congestion., He started non bloody watery brown diarrhea - approx 3 episodes per day 3 days ago, which is now become formed stool. Endorses muscle aches in his low back however improve throughout the day as we walks. He has productive cough.  He has been using mucinex, nasal spray  No sob, wheezing, cp, leg swelling, vomiting, dysuria.   His daughter is in healthcare and he would like to discuss paxlovid prescription and prevention of blood clots. He has no h/o blood clot.  He is walking throughout the day.   He has had moderna vaccine , no booster.   H/o chronic low back pain, DM, HTN, OSA with 2L with Cipap.    He hasnt use norco in over 8 weeks.   Never smoker.   Tessalon perles do not work for him.   Plan:  Advised mucinex DM to help with cough and that he stop plain Mucinex. He declines tessalon perles.   Counseled that I have not prescribed Paxlovid under EUA which I discussed with supervising MD, Dr Derrel Nip. Paxlovid to my knowledge is not used to prevent blood clots. Dr Derrel Nip and I agreed that we didn't feel comfortable prescribing and I advised MAI based on co morbities. He declines MAI. Encouraged continued walking .  Advised moderna booster once he is feeling better.  Counseled on quarantine and if any dyspnea, worsening diarrhea, dizziness, he would need to go to ED for in person evaluation.  He will let know of any concerns

## 2020-06-14 NOTE — Telephone Encounter (Signed)
Reviewed

## 2020-06-28 DIAGNOSIS — N1831 Chronic kidney disease, stage 3a: Secondary | ICD-10-CM | POA: Diagnosis not present

## 2020-06-28 DIAGNOSIS — D631 Anemia in chronic kidney disease: Secondary | ICD-10-CM | POA: Diagnosis not present

## 2020-06-28 DIAGNOSIS — N2581 Secondary hyperparathyroidism of renal origin: Secondary | ICD-10-CM | POA: Diagnosis not present

## 2020-06-28 DIAGNOSIS — I10 Essential (primary) hypertension: Secondary | ICD-10-CM | POA: Diagnosis not present

## 2020-06-29 ENCOUNTER — Telehealth: Payer: Self-pay

## 2020-06-29 NOTE — Telephone Encounter (Signed)
I called the patient and informed him that I received forms for genetic testing and I want to know if he approves of this for the provider to sign.  Patient stated it was a robocall and he is not interested in doing the genetic testing.  Nina,cma

## 2020-07-03 ENCOUNTER — Ambulatory Visit (INDEPENDENT_AMBULATORY_CARE_PROVIDER_SITE_OTHER): Payer: Medicare Other | Admitting: Pharmacist

## 2020-07-03 DIAGNOSIS — E1142 Type 2 diabetes mellitus with diabetic polyneuropathy: Secondary | ICD-10-CM

## 2020-07-03 MED ORDER — DAPAGLIFLOZIN PROPANEDIOL 10 MG PO TABS: 10.0000 mg | ORAL_TABLET | Freq: Every day | ORAL | 3 refills | Status: DC

## 2020-07-03 NOTE — Patient Instructions (Addendum)
It was great talking with you today!  Start [name] 10 mg daily, in the morning. We will work on securing patient assistance from the manufacturer.   Keep a check on your blood pressure. Please let me know if you have more dizziness or low blood pressures.   CALL ME if you run out of Ozempic before the shipment arrives from [employer].   Catie Darnelle Maffucci, PharmD [phone]  Visit Information  PATIENT GOALS: Goals Addressed              This Visit's Progress     Patient Stated   .  Medication Monitoring (pt-stated)        Patient Goals/Self-Care Activities . Over the next 90 days, patient will:  - take medications as prescribed check blood glucose three times daily using CGM, document, and provide at future appointments check blood pressure twice daily, document, and provide at future appointments         Print copy of patient instructions, educational materials, and care plan provided in person.   Plan: Telephone follow up appointment with care management team member scheduled for:  ~ 4 weeks  Catie Darnelle Maffucci, PharmD, [place], [place] Clinical Pharmacist [employer] at [employer] [phone]

## 2020-07-03 NOTE — Chronic Care Management (AMB) (Signed)
Chronic Care Management Pharmacy Note  07/03/2020 Name:  Douglas [name] MRN:  [id number] DOB:  [birth year]  Subjective: Douglas [name] is an 75 y.o. year old male who is a primary patient of [name], [name], MD.  The CCM team was consulted for assistance with disease management and care coordination needs.    Engaged with patient by telephone for follow up visit in response to provider referral for pharmacy case management and/or care coordination services.   Consent to Services:  The patient was given information about Chronic Care Management services, agreed to services, and gave verbal consent prior to initiation of services.  Please see initial visit note for detailed documentation.   Patient Care Team: [name], MD as PCP - General (Family Medicine) Wellington Hampshire, MD as PCP - Cardiology (Cardiology) Anthonette Legato, MD (Internal Medicine) Watt Climes, PA as Physician Assistant (Physician Assistant) De Hollingshead, RPH-CPP (Pharmacist)  Recent office visits:  2/22- PCP- ran out of [place], had not informed us. Continue curregimen  Recent consult visits:  3/17 - nephrology Dr. Holley Raring, no change  Hospital visits: None in previous 6 months  Objective:  Lab Results  Component Value Date   CREATININE 1.51 (H) 06/05/2020   CREATININE 1.45 06/10/2019   CREATININE 1.49 (H) 01/19/2019    Lab Results  Component Value Date   HGBA1C 7.6 (H) 06/05/2020   Last diabetic Eye exam:  Lab Results  Component Value Date/Time   HMDIABEYEEXA No Retinopathy 03/12/2020 12:00 AM    Last diabetic Foot exam:  Lab Results  Component Value Date/Time   HMDIABFOOTEX Normal 11/24/2014 12:00 AM        Component Value Date/Time   CHOL 115 11/23/2019 1350   CHOL 142 11/12/2018 1142   TRIG 174.0 (H) 11/23/2019 1350   HDL 40.80 11/23/2019 1350   HDL 49 11/12/2018 1142   CHOLHDL 3 11/23/2019 1350   VLDL 34.8 11/23/2019 1350   LDLCALC 40 11/23/2019 1350    LDLCALC 58 11/12/2018 1142    Hepatic Function Latest Ref Rng & Units 11/23/2019 11/12/2018 08/19/2017  Total Protein 6.0 - 8.3 g/dL 6.5 6.3 6.6  Albumin 3.5 - 5.2 g/dL 4.1 4.3 3.8  AST 0 - 37 U/L 26 17 20   ALT 0 - 53 U/L 24 23 20   Alk Phosphatase 39 - 117 U/L 69 75 59  Total Bilirubin 0.2 - 1.2 mg/dL 0.7 0.6 0.6  Bilirubin, Direct 0.0 - 0.3 mg/dL 0.2 - -    Lab Results  Component Value Date/Time   TSH 1.72 03/02/2020 10:36 AM   TSH 3.72 04/21/2019 10:18 AM    CBC Latest Ref Rng & Units 01/19/2019 09/10/2017 02/18/2017  WBC 4.0 - 10.5 K/uL 10.6(H) 9.5 7.5  Hemoglobin 13.0 - 17.0 g/dL 11.8(L) 11.7(L) 13.0  Hematocrit 39.0 - 52.0 % 36.6(L) 36.0(L) 40.3  Platelets 150 - 400 K/uL 258 210 225.0    Lab Results  Component Value Date/Time   VD25OH 83 05/02/2013 01:18 PM    Clinical ASCVD: No      Social History   Tobacco Use  Smoking Status Never Smoker  Smokeless Tobacco Never Used   BP Readings from Last 3 Encounters:  06/05/20 138/84  05/03/20 (!) 171/81  03/28/20 (!) 144/79   Pulse Readings from Last 3 Encounters:  06/05/20 71  05/03/20 84  03/28/20 83   Wt Readings from Last 3 Encounters:  06/05/20 268 lb 4 oz (121.7 kg)  05/03/20 269 lb (122 kg)  03/02/20 282 lb (127.9 kg)    Assessment: Review of patient past medical history, allergies, medications, health status, including review of consultants reports, laboratory and other test data, was performed as part of comprehensive evaluation and provision of chronic care management services.   SDOH:  (Social Determinants of Health) assessments and interventions performed:    CCM Care Plan  Allergies  Allergen Reactions  . Bee Venom Swelling  . Dye Fdc Red [Red Dye] Swelling and Other (See Comments)    Reaction: gout  . Atenolol Other (See Comments)    Did not regulate blood pressure  . Benadryl [Diphenhydramine Hcl (Sleep)] Other (See Comments)    Hyperactivity   . Enalapril Itching    itching  .  Gabapentin Palpitations and Rash    rash  . Hctz [Hydrochlorothiazide] Other (See Comments)    Decreased potassium    Medications Reviewed Today    Reviewed by Burnard Hawthorne, FNP (Family Nurse Practitioner) on 06/11/20 at 1330  Med List Status: <None>  Medication Order Taking? Sig Documenting Provider Last Dose Status Informant  allopurinol (ZYLOPRIM) 300 MG tablet [id number] No TAKE 1 TABLET(300 MG) BY MOUTH DAILY [name], MD Taking Active   aspirin 81 MG tablet [id number] No Take 1 tablet (81 mg total) by mouth daily. Wellington Hampshire, MD Taking Active Self  BD PEN NEEDLE NANO 2ND GEN 32G X 4 MM MISC [id number]  USE DAILY [name], MD  Active   Blood Glucose Monitoring Suppl (FREESTYLE FREEDOM LITE) W/DEVICE KIT [id number] No Use as directed Jackolyn Confer, MD Taking Active Self  calcitRIOL (ROCALTROL) 0.25 MCG capsule [id number] No Take 0.25 mcg by mouth daily.  [provider] Taking Active Self  carvedilol (COREG) 6.25 MG tablet [id number] No Take 1 tablet (6.25 mg total) by mouth 2 (two) times daily with a meal. [name], MD Taking Active   clove oil liquid [id number] No Apply 1 application topically as needed. [provider] Taking Active Self  Continuous Blood Gluc Sensor (FREESTYLE LIBRE 14 DAY SENSOR) [place] [id number]  Apply every 2 weeks [name], MD  Active   doxazosin (CARDURA) 4 MG tablet [id number] No Take 0.5 tablets (2 mg total) by mouth daily. [name], MD Taking Active   DULoxetine (CYMBALTA) 60 MG capsule [id number] No TAKE 1 CAPSULE(60 MG) BY MOUTH DAILY [name], MD Taking Active   esomeprazole (NEXIUM) 40 MG capsule [id number] No Take 1 capsule (40 mg total) by mouth daily at 12 noon.  Patient taking differently: Take 40 mg by mouth daily.   Jackolyn Confer, MD Taking Active   finasteride (PROSCAR) 5 MG tablet [id number] No TAKE 1 TABLET(5 MG) BY MOUTH AT BEDTIME Stoioff, Ronda Fairly, MD Taking  Active   fluticasone (FLONASE) 50 MCG/ACT nasal spray [id number] No Place 2 sprays into both nostrils daily. [name], MD Taking Active   FREESTYLE LITE test strip [id number] No CHECK BLOOD SUGAR TWICE DAILY. [name], MD Taking Active   furosemide (LASIX) 40 MG tablet [id number] No TAKE 1 TABLET(40 MG) BY MOUTH DAILY Einar Pheasant, MD Taking Active   HYDROcodone-acetaminophen (NORCO/VICODIN) 5-325 MG tablet [id number]  Take 1 tablet by mouth every 6 (six) hours as needed for moderate pain. [name], MD  Active   insulin glargine (LANTUS SOLOSTAR) 100 UNIT/ML Solostar Pen [id number] No Inject 52 Units into the skin daily. [name], MD Unknown Active   levothyroxine (SYNTHROID) 100 MCG tablet  [id number] No TAKE 1 TABLET(100 MCG) BY MOUTH DAILY BEFORE BREAKFAST [name], MD Taking Active   loratadine (CLARITIN) 10 MG tablet [id number] No Take 10 mg by mouth daily as needed for allergies. [provider] Taking Active   losartan (COZAAR) 25 MG tablet [id number] No Take 1 tablet QAM, 2 tablets QPM [name], MD Taking Active   Magnesium 250 MG TABS [id number] No Take 1 tablet by mouth daily.  [provider] Taking Active Self  metFORMIN (GLUCOPHAGE-XR) 500 MG 24 hr tablet [id number] No Take 1 tablet (500 mg) by mouth in the morning, take 2 tablets (1000 mg) by mouth in the evening [name], MD Taking Active   pregabalin (LYRICA) 100 MG capsule [id number] No TAKE 2 CAPSULES BY MOUTH TWICE DAILY [name], MD Taking Active   Semaglutide,0.25 or 0.5MG/DOS, (OZEMPIC, 0.25 OR 0.5 MG/DOSE,) 2 MG/1.5ML SOPN [id number] No Inject 0.5 mg into the skin once a week. [provider] Taking Active   simvastatin (ZOCOR) 40 MG tablet [id number] No TAKE 1 TABLET(40 MG) BY MOUTH AT BEDTIME [name], MD Taking Active   tadalafil (CIALIS) 20 MG tablet [id number] No Take by mouth. [provider] Taking Active    testosterone cypionate (DEPOTESTOSTERONE CYPIONATE) 200 MG/ML injection [id number] No Inject 200 mg into the muscle every 14 (fourteen) days. [provider] Taking Active           Patient Active Problem List   Diagnosis Date Noted  . Skin exam, screening for cancer 03/02/2020  . Anemia in chronic kidney disease 02/18/2019  . Secondary hyperparathyroidism of renal origin ([place]) 02/18/2019  . Right kidney mass 01/20/2019  . Erectile dysfunction due to arterial insufficiency 11/09/2018  . Respiratory illness 11/05/2018  . Tick bite of knee, initial encounter 08/17/2018  . Left shoulder pain 07/02/2018  . Allergic rhinitis 07/02/2018  . Cervical radiculopathy 11/20/2017  . Toe pain, bilateral 11/20/2017  . Abdominal pain 02/18/2017  . Clavicle enlargement 02/18/2017  . Anxiety 02/18/2017  . Degenerative arthritis of knee, bilateral 09/04/2016  . BPH (benign prostatic hyperplasia) 08/18/2016  . Knee osteoarthritis 08/18/2016  . Hyperlipidemia 05/20/2016  . Essential hypertension 05/31/2015  . Low testosterone 02/27/2015  . Diabetic neuropathy ([place]) 01/05/2013  . Obesity (BMI 30-39.9) 01/05/2013  . Chronic back pain greater than 3 months duration 01/05/2013  . Gout 01/05/2013  . OSA (obstructive sleep apnea) 01/05/2013  . Chronic kidney disease 01/04/2013  . DM type 2 with diabetic peripheral neuropathy ([place]) 01/04/2013  . Hypothyroidism 01/04/2013    Immunization History  Administered Date(s) Administered  . Fluad Quad(high Dose 65+) 04/21/2019, 03/02/2020  . Influenza, High Dose Seasonal PF 02/15/2016, 02/16/2017, 03/01/2018  . Influenza,inj,Quad PF,6+ Mos 01/04/2013, 02/20/2014, 02/27/2015  . Moderna Sars-Covid-2 Vaccination 10/10/2019, 11/07/2019  . Pneumococcal Conjugate-13 05/02/2013  . Pneumococcal Polysaccharide-23 01/05/2011  . Tdap 01/05/2008    Conditions to be addressed/monitored: HTN, HLD, DMII and CKD  Care Plan : Medication Management  Updates  made by De Hollingshead, RPH-CPP since 07/03/2020 12:00 AM    Problem: Diabetes, Hypertension     Long-Range Goal: Disease Progression Prevention   Start Date: 01/11/2020  This Visit's Progress: On track  Recent Progress: On track  Priority: High  Note:   Current Barriers:  . Unable to independently afford treatment regimen . Unable to achieve control of diabetes   Pharmacist Clinical Goal(s):  Marland Kitchen Over the next 90 days, patient will verbalize ability to afford treatment regimen. . Over  the next 90 days, patient will achieve control of diabetes as evidenced by improvement in A1c through collaboration with PharmD and provider.   Interventions: . 1:1 collaboration with [name], MD regarding development and update of comprehensive plan of care as evidenced by provider attestation and co-signature . Inter-disciplinary care team collaboration (see longitudinal plan of care) . Comprehensive medication review performed; medication list updated in electronic medical record  SDOH: . Reports he is recovering from [place]. Notes some fatigue.  Diabetes: . Uncontrolled; current treatment: Ozempic 0.5 mg weekly; Lantus 52 units daily - plan was to transition [place] in the future via patient assistance- notes today that Lantus was over $100 so he has not been taking it, metformin XR 1000 mg QAM, 1000 mg QPM  . Approved for patient assistance, awaiting first fill in the next several weeks . Reports steroid injection in his back last week, resulted in higher glucose readings. Notes that he is being woken up by a high glucose alarm every evening.  . Current glucose readings: using Libre 2 CGM. Date of Download: 07/03/20 Average Glucose: last 14 days: 154 mg/dL; midnight- 6 am: 154; 6 am-noon: 145; noon- 6 pm:153; 6 pm - midnight: 164;  Time in Goal:  - Time in range 70-180: 80% - Time above range: 20% - Time below range: 0% . Discussed use of SGLT2 for glycemic and renal benefits. He  would qualify for [place] assistance through [place]. Counseled on benefits, side effects. Patient amenable. Start [name] 10 mg daily.  . Continue Ozempic 0.5 mg weekly until supply arrives from manufacturer. Continue metformin 1000 mg BID. Will send updated script for metformin.  . Discontinue insulin as patient's glycemic control can likely be achieved w/ addition of SGLT2 and dose increase of Ozempic. Reviewed that CGM may not be covered if patient is no longer on insulin. Will follow. Continue use of CGM would be very beneficial for continued glucose management  Hypertension: . Controlled per home readings; current treatment: losartan 25 mg QAM, 50 mg QPM, doxazosin 2 mg QPM, carvedilol 6.25 mg BID; furosemide 40 mg QAM . Confirms accuracy of home cuff - reports that home cuff was accurate with office manual measurement o Notes dizziness w/ higher dose of doxazosin o Afternoon dizziness/sleepiness w/ losartan 50 mg dose in the morning o Hx hypokalemia w/ HCTZ o Itching w/ enalapril o Reported lack of benefit w/ generic atenolol o No hx spironolactone.  . Current BP readings: 120-130s/70-80s . Does report some episodes of dizziness, but relates more to post COVID . Continue current regimen at this time. Discussed diuretic benefit of SGLT2 and that it may have beneficial impact on BP. Contact us if worsening dizziness, lightheadedness. Would consider dose reduction of diuretic at that time.   Hyperlipidemia: . Controlled per last lab work; current treatment: simvastatin 40 mg daily . Recommended to continue current regimen  Peripheral Neuropathy, Chronic Back Pain: . Controlled at this time current treatment: duloxetine 60 mg daily, pregabalin 200 mg BID, hydocodone-APAP 5/325 mg PRN (reports that a prescription for 30 tablets will last about a year), topical clove oil and Salonpas; PT for mobility support; cyclobenzaprine 10 mg PRN  . Reports improvement due to [facility] patches  lately . Continue current regimen at this time  CKD: . Stable. Current treatment: calcitriol 0.25 mg daily. Follows w/ Dr. Holley Raring . Encouraged to continue current regimen and collaboration w/ nephrology at this time. . Addition of SGLT2 as above . Reviewed importance of continuing focus  on achieving and maintaining goal BP and glycemic control  Gout: . Controlled, patient has not had a gout flare since initial diagnosis; current treatment: allopurinol 300 mg daily . Continue current regimen at this time  Patient Goals/Self-Care Activities . Over the next 90 days, patient will:  - take medications as prescribed check glucose three times daily using CGM, document, and provide at future appointments check blood pressure daily, document, and provide at future appointments collaborate with provider on medication access solutions   Follow Up Plan: Telephone follow up appointment with care management team member scheduled for: ~ 4 weeks     Medication Assistance: Ozempic obtained through [employer] medication assistance program.  Enrollment ends 03/13/21. Working on [place] assistance through [employer].   Patient's preferred pharmacy is:  [id number] [address], [address] [place] [address] Phone: [phone] Fax: [phone]  Uses pill box? Yes Pt endorses 100% compliance  Follow Up:  Patient agrees to Care Plan and Follow-up.  Plan: Telephone follow up appointment with care management team member scheduled for:  ~ 4 weeks  Catie Darnelle Maffucci, PharmD, [place], [place] Clinical Pharmacist [employer] at [employer] [phone]

## 2020-07-06 ENCOUNTER — Other Ambulatory Visit: Payer: Self-pay

## 2020-07-06 ENCOUNTER — Telehealth: Payer: Self-pay

## 2020-07-06 NOTE — Telephone Encounter (Signed)
I called and spoke with the patient's wife and informed her that the patient's medication, tresiba, ozempic and needles has arrived from patient assistance and to pick up before 5 pm and she understood.  Nina,cma

## 2020-07-07 ENCOUNTER — Other Ambulatory Visit: Payer: Self-pay | Admitting: Family Medicine

## 2020-07-07 DIAGNOSIS — E1142 Type 2 diabetes mellitus with diabetic polyneuropathy: Secondary | ICD-10-CM

## 2020-07-07 DIAGNOSIS — E118 Type 2 diabetes mellitus with unspecified complications: Secondary | ICD-10-CM

## 2020-07-09 ENCOUNTER — Ambulatory Visit: Payer: Medicare Other | Admitting: Pharmacist

## 2020-07-09 DIAGNOSIS — E1142 Type 2 diabetes mellitus with diabetic polyneuropathy: Secondary | ICD-10-CM

## 2020-07-09 DIAGNOSIS — E785 Hyperlipidemia, unspecified: Secondary | ICD-10-CM | POA: Diagnosis not present

## 2020-07-09 DIAGNOSIS — N1831 Chronic kidney disease, stage 3a: Secondary | ICD-10-CM | POA: Diagnosis not present

## 2020-07-09 DIAGNOSIS — I10 Essential (primary) hypertension: Secondary | ICD-10-CM

## 2020-07-09 NOTE — Chronic Care Management (AMB) (Signed)
Chronic Care Management Pharmacy Note  07/09/2020 Name:  JAESHAWN SILVIO MRN:  [id number] DOB:  [birth year]  Subjective: Douglas [name] is an 75 y.o. year old male who is a primary patient of [name], [name], MD.  The CCM team was consulted for assistance with disease management and care coordination needs.    Engaged with patient face to face for follow up visit in response to provider referral for pharmacy case management and/or care coordination services.   Consent to Services:  The patient was given information about Chronic Care Management services, agreed to services, and gave verbal consent prior to initiation of services.  Please see initial visit note for detailed documentation.   Patient Care Team: [name], MD as PCP - General (Family Medicine) Wellington Hampshire, MD as PCP - Cardiology (Cardiology) Anthonette Legato, MD (Internal Medicine) Watt Climes, PA as Physician Assistant (Physician Assistant) De Hollingshead, RPH-CPP (Pharmacist)  Recent office visits: None since our last call  Recent consult visits: None since our last call  Hospital visits: None in previous 6 months  Objective:  Lab Results  Component Value Date   CREATININE 1.51 (H) 06/05/2020   CREATININE 1.45 06/10/2019   CREATININE 1.49 (H) 01/19/2019    Lab Results  Component Value Date   HGBA1C 7.6 (H) 06/05/2020   Last diabetic Eye exam:  Lab Results  Component Value Date/Time   HMDIABEYEEXA No Retinopathy 03/12/2020 12:00 AM    Last diabetic Foot exam:  Lab Results  Component Value Date/Time   HMDIABFOOTEX Normal 11/24/2014 12:00 AM        Component Value Date/Time   CHOL 115 11/23/2019 1350   CHOL 142 11/12/2018 1142   TRIG 174.0 (H) 11/23/2019 1350   HDL 40.80 11/23/2019 1350   HDL 49 11/12/2018 1142   CHOLHDL 3 11/23/2019 1350   VLDL 34.8 11/23/2019 1350   LDLCALC 40 11/23/2019 1350   LDLCALC 58 11/12/2018 1142    Hepatic Function Latest Ref  Rng & Units 11/23/2019 11/12/2018 08/19/2017  Total Protein 6.0 - 8.3 g/dL 6.5 6.3 6.6  Albumin 3.5 - 5.2 g/dL 4.1 4.3 3.8  AST 0 - 37 U/L _0 ALT 0 - 53 U/L _1 Alk Phosphatase 39 - 117 U/L 69 75 59  Total Bilirubin 0.2 - 1.2 mg/dL 0.7 0.6 0.6  Bilirubin, Direct 0.0 - 0.3 mg/dL 0.2 - -    Lab Results  Component Value Date/Time   TSH 1.72 03/02/2020 10:36 AM   TSH 3.72 04/21/2019 10:18 AM    CBC Latest Ref Rng & Units 01/19/2019 09/10/2017 02/18/2017  WBC 4.0 - 10.5 K/uL 10.6(H) 9.5 7.5  Hemoglobin 13.0 - 17.0 g/dL 11.8(L) 11.7(L) 13.0  Hematocrit 39.0 - 52.0 % 36.6(L) 36.0(L) 40.3  Platelets 150 - 400 K/uL 258 210 225.0    Lab Results  Component Value Date/Time   VD25OH 83 05/02/2013 01:18 PM    Clinical ASCVD: No    Social History   Tobacco Use  Smoking Status Never Smoker  Smokeless Tobacco Never Used   BP Readings from Last 3 Encounters:  06/05/20 138/84  05/03/20 (!) 171/81  03/28/20 (!) 144/79   Pulse Readings from Last 3 Encounters:  06/05/20 71  05/03/20 84  03/28/20 83   Wt Readings from Last 3 Encounters:  06/05/20 268 lb 4 oz (121.7 kg)  05/03/20 269 lb (122 kg)  03/02/20 282 lb (127.9 kg)    Assessment: Review of  patient past medical history, allergies, medications, health status, including review of consultants reports, laboratory and other test data, was performed as part of comprehensive evaluation and provision of chronic care management services.   SDOH:  (Social Determinants of Health) assessments and interventions performed:  SDOH Interventions   Flowsheet Row Most Recent Value  SDOH Interventions   Financial Strain Interventions Other (Comment)  [patient assistance]      CCM Care Plan  Allergies  Allergen Reactions  . Bee Venom Swelling  . Dye Fdc Red [Red Dye] Swelling and Other (See Comments)    Reaction: gout  . Atenolol Other (See Comments)    Did not regulate blood pressure  . Benadryl [Diphenhydramine Hcl (Sleep)]  Other (See Comments)    Hyperactivity   . Enalapril Itching    itching  . Gabapentin Palpitations and Rash    rash  . Hctz [Hydrochlorothiazide] Other (See Comments)    Decreased potassium    Medications Reviewed Today    Reviewed by Burnard Hawthorne, FNP (Family Nurse Practitioner) on 06/11/20 at 1330  Med List Status: <None>  Medication Order Taking? Sig Documenting Provider Last Dose Status Informant  allopurinol (ZYLOPRIM) 300 MG tablet [id number] No TAKE 1 TABLET(300 MG) BY MOUTH DAILY [name], MD Taking Active   aspirin 81 MG tablet [id number] No Take 1 tablet (81 mg total) by mouth daily. Wellington Hampshire, MD Taking Active Self  BD PEN NEEDLE NANO 2ND GEN 32G X 4 MM MISC [id number]  USE DAILY [name], MD  Active   Blood Glucose Monitoring Suppl (FREESTYLE FREEDOM LITE) W/DEVICE KIT [id number] No Use as directed Jackolyn Confer, MD Taking Active Self  calcitRIOL (ROCALTROL) 0.25 MCG capsule [id number] No Take 0.25 mcg by mouth daily.  [provider] Taking Active Self  carvedilol (COREG) 6.25 MG tablet [id number] No Take 1 tablet (6.25 mg total) by mouth 2 (two) times daily with a meal. [name], MD Taking Active   clove oil liquid [id number] No Apply 1 application topically as needed. [provider] Taking Active Self  Continuous Blood Gluc Sensor (FREESTYLE LIBRE 14 DAY SENSOR) [place] [id number]  Apply every 2 weeks [name], MD  Active   doxazosin (CARDURA) 4 MG tablet [id number] No Take 0.5 tablets (2 mg total) by mouth daily. [name], MD Taking Active   DULoxetine (CYMBALTA) 60 MG capsule [id number] No TAKE 1 CAPSULE(60 MG) BY MOUTH DAILY [name], MD Taking Active   esomeprazole (NEXIUM) 40 MG capsule [id number] No Take 1 capsule (40 mg total) by mouth daily at 12 noon.  Patient taking differently: Take 40 mg by mouth daily.   Jackolyn Confer, MD Taking Active   finasteride (PROSCAR) 5 MG tablet  [id number] No TAKE 1 TABLET(5 MG) BY MOUTH AT BEDTIME Stoioff, Ronda Fairly, MD Taking Active   fluticasone (FLONASE) 50 MCG/ACT nasal spray [id number] No Place 2 sprays into both nostrils daily. [name], MD Taking Active   FREESTYLE LITE test strip [id number] No CHECK BLOOD SUGAR TWICE DAILY. [name], MD Taking Active   furosemide (LASIX) 40 MG tablet [id number] No TAKE 1 TABLET(40 MG) BY MOUTH DAILY Einar Pheasant, MD Taking Active   HYDROcodone-acetaminophen (NORCO/VICODIN) 5-325 MG tablet [id number]  Take 1 tablet by mouth every 6 (six) hours as needed for moderate pain. [name], MD  Active   insulin glargine (LANTUS SOLOSTAR) 100 UNIT/ML Solostar Pen [id number] No Inject 52 Units into the skin  daily. [name], MD Unknown Active   levothyroxine (SYNTHROID) 100 MCG tablet [id number] No TAKE 1 TABLET(100 MCG) BY MOUTH DAILY BEFORE BREAKFAST [name], MD Taking Active   loratadine (CLARITIN) 10 MG tablet [id number] No Take 10 mg by mouth daily as needed for allergies. [provider] Taking Active   losartan (COZAAR) 25 MG tablet [id number] No Take 1 tablet QAM, 2 tablets QPM [name], MD Taking Active   Magnesium 250 MG TABS [id number] No Take 1 tablet by mouth daily.  [provider] Taking Active Self  metFORMIN (GLUCOPHAGE-XR) 500 MG 24 hr tablet [id number] No Take 1 tablet (500 mg) by mouth in the morning, take 2 tablets (1000 mg) by mouth in the evening [name], MD Taking Active   pregabalin (LYRICA) 100 MG capsule [id number] No TAKE 2 CAPSULES BY MOUTH TWICE DAILY [name], MD Taking Active   Semaglutide,0.25 or 0.5MG/DOS, (OZEMPIC, 0.25 OR 0.5 MG/DOSE,) 2 MG/1.5ML SOPN [id number] No Inject 0.5 mg into the skin once a week. [provider] Taking Active   simvastatin (ZOCOR) 40 MG tablet [id number] No TAKE 1 TABLET(40 MG) BY MOUTH AT BEDTIME [name], MD Taking Active   tadalafil (CIALIS) 20 MG  tablet [id number] No Take by mouth. [provider] Taking Active   testosterone cypionate (DEPOTESTOSTERONE CYPIONATE) 200 MG/ML injection [id number] No Inject 200 mg into the muscle every 14 (fourteen) days. [provider] Taking Active           Patient Active Problem List   Diagnosis Date Noted  . Skin exam, screening for cancer 03/02/2020  . Anemia in chronic kidney disease 02/18/2019  . Secondary hyperparathyroidism of renal origin ([place]) 02/18/2019  . Right kidney mass 01/20/2019  . Erectile dysfunction due to arterial insufficiency 11/09/2018  . Respiratory illness 11/05/2018  . Tick bite of knee, initial encounter 08/17/2018  . Left shoulder pain 07/02/2018  . Allergic rhinitis 07/02/2018  . Cervical radiculopathy 11/20/2017  . Toe pain, bilateral 11/20/2017  . Abdominal pain 02/18/2017  . Clavicle enlargement 02/18/2017  . Anxiety 02/18/2017  . Degenerative arthritis of knee, bilateral 09/04/2016  . BPH (benign prostatic hyperplasia) 08/18/2016  . Knee osteoarthritis 08/18/2016  . Hyperlipidemia 05/20/2016  . Essential hypertension 05/31/2015  . Low testosterone 02/27/2015  . Diabetic neuropathy ([place]) 01/05/2013  . Obesity (BMI 30-39.9) 01/05/2013  . Chronic back pain greater than 3 months duration 01/05/2013  . Gout 01/05/2013  . OSA (obstructive sleep apnea) 01/05/2013  . Chronic kidney disease 01/04/2013  . DM type 2 with diabetic peripheral neuropathy ([place]) 01/04/2013  . Hypothyroidism 01/04/2013    Immunization History  Administered Date(s) Administered  . Fluad Quad(high Dose 65+) 04/21/2019, 03/02/2020  . Influenza, High Dose Seasonal PF 02/15/2016, 02/16/2017, 03/01/2018  . Influenza,inj,Quad PF,6+ Mos 01/04/2013, 02/20/2014, 02/27/2015  . Moderna Sars-Covid-2 Vaccination 10/10/2019, 11/07/2019  . Pneumococcal Conjugate-13 05/02/2013  . Pneumococcal Polysaccharide-23 01/05/2011  . Tdap 01/05/2008    Conditions to be  addressed/monitored: HTN, HLD and DMII  Care Plan : Medication Management  Updates made by De Hollingshead, RPH-CPP since 07/09/2020 12:00 AM    Problem: Diabetes, Hypertension     Long-Range Goal: Disease Progression Prevention   Start Date: 01/11/2020  Recent Progress: On track  Priority: High  Note:   Current Barriers:  . Unable to independently afford treatment regimen . Unable to achieve control of diabetes   Pharmacist Clinical Goal(s):  Marland Kitchen Over the next 90 days, patient will verbalize  ability to afford treatment regimen. . Over the next 90 days, patient will achieve control of diabetes as evidenced by improvement in A1c through collaboration with PharmD and provider.   Interventions: . 1:1 collaboration with [name], MD regarding development and update of comprehensive plan of care as evidenced by provider attestation and co-signature . Inter-disciplinary care team collaboration (see longitudinal plan of care) . Comprehensive medication review performed; medication list updated in electronic medical record  Diabetes: . Uncontrolled; current treatment: Ozempic 0.5 mg weekly - to increase to 1 mg when he completes current supply, metformin XR 1000 mg BID, [name] 10 mg daily - notes today that he is splitting in half because 10 mg was resulting in extremely dry mouth . [name] patient assistance submitted 3/22. CPhT to follow up.  . Continue [name] 5 mg daily at this time. Increase Ozempic to 1 mg weekly as previously discussed when he completes current supply. Continue metformin XR 1000 mg BID  Hypertension: . Controlled per home readings; current treatment: losartan 25 mg QAM, 50 mg QPM, doxazosin 2 mg QPM, carvedilol 6.25 mg BID; furosemide 40 mg QAM . Confirms accuracy of home cuff - reports that home cuff was accurate with office manual measurement o Notes dizziness w/ higher dose of doxazosin o Afternoon dizziness/sleepiness w/ losartan 50 mg dose in the  morning o Hx hypokalemia w/ HCTZ o Itching w/ enalapril o Reported lack of benefit w/ generic atenolol o No hx spironolactone.  . Recommend to continue current regimen at this time.   Hyperlipidemia: . Controlled per last lab work; current treatment: simvastatin 40 mg daily . Recommended to continue current regimen  Peripheral Neuropathy, Chronic Back Pain: . Controlled at this time current treatment: duloxetine 60 mg daily, pregabalin 200 mg BID, hydocodone-APAP 5/325 mg PRN (reports that a prescription for 30 tablets will last about a year), topical clove oil and Salonpas; PT for mobility support; cyclobenzaprine 10 mg PRN  . Reports improvement due to [facility] patches lately . Previously recommended to continue current regimen.   CKD: . Stable. Current treatment: calcitriol 0.25 mg daily. Follows w/ Dr. Holley Raring . Previously recommended to continue current regimen and collaboration w/ nephrology at this time.  Gout: . Controlled, patient has not had a gout flare since initial diagnosis; current treatment: allopurinol 300 mg daily . Previously recommended to continue current regimen at this time  Patient Goals/Self-Care Activities . Over the next 90 days, patient will:  - take medications as prescribed check glucose three times daily using CGM, document, and provide at future appointments check blood pressure daily, document, and provide at future appointments collaborate with provider on medication access solutions   Follow Up Plan: Telephone follow up appointment with care management team member scheduled for: ~ 3 weeks as previously scheduled      Medication Assistance: None required.  Patient affirms current coverage meets needs.  Patient's preferred pharmacy is:  [id number] [address], [address] [place] [address] Phone: [phone] Fax: [phone]   Follow Up:  Patient  agrees to Care Plan and Follow-up.  Plan: Telephone follow up appointment with care management team member scheduled for:  ~ 3 weeks as previously scheduled  Catie Darnelle Maffucci, PharmD, [place], [place] Clinical Pharmacist [employer] [facility] [employer] [phone]

## 2020-07-09 NOTE — Patient Instructions (Signed)
Visit Information  PATIENT GOALS: Goals Addressed              This Visit's Progress     Patient Stated   .  Medication Monitoring (pt-stated)        Patient Goals/Self-Care Activities . Over the next 90 days, patient will:  - take medications as prescribed check blood glucose three times daily using CGM, document, and provide at future appointments check blood pressure twice daily, document, and provide at future appointments          Patient verbalizes understanding of instructions provided today and agrees to view in [place].   Plan: Telephone follow up appointment with care management team member scheduled for:  ~ 3 weeks as previously scheduled  Catie Darnelle Maffucci, PharmD, [place], [place] Clinical Pharmacist [employer] [facility] [employer] [phone]

## 2020-07-11 ENCOUNTER — Telehealth: Payer: Self-pay | Admitting: Pharmacy Technician

## 2020-07-11 DIAGNOSIS — Z596 Low income: Secondary | ICD-10-CM

## 2020-07-11 NOTE — Progress Notes (Signed)
[facility] [facility])                                            [place] Team    07/11/2020  Douglas [name] 1945-08-08 [id number]  Care coordination call placed to [name] in regards to patient's application for [name] to [address] and she informed patient is APPROVED in the program from  07/11/20-04/13/21.  Fausto Sampedro P. Christohper Dube, [name]  [phone]

## 2020-07-12 ENCOUNTER — Telehealth: Payer: Self-pay

## 2020-07-12 NOTE — Telephone Encounter (Signed)
I received a fax that the the patient was approved for the [employer] Prescription Savings Program effective  07/11/2020 through 04/13/2021.  Kamelia Lampkins,cma

## 2020-08-01 ENCOUNTER — Ambulatory Visit (INDEPENDENT_AMBULATORY_CARE_PROVIDER_SITE_OTHER): Payer: Medicare Other | Admitting: Pharmacist

## 2020-08-01 DIAGNOSIS — E118 Type 2 diabetes mellitus with unspecified complications: Secondary | ICD-10-CM | POA: Diagnosis not present

## 2020-08-01 DIAGNOSIS — E1142 Type 2 diabetes mellitus with diabetic polyneuropathy: Secondary | ICD-10-CM

## 2020-08-01 DIAGNOSIS — N1831 Chronic kidney disease, stage 3a: Secondary | ICD-10-CM | POA: Diagnosis not present

## 2020-08-01 DIAGNOSIS — I10 Essential (primary) hypertension: Secondary | ICD-10-CM

## 2020-08-01 DIAGNOSIS — E785 Hyperlipidemia, unspecified: Secondary | ICD-10-CM

## 2020-08-01 MED ORDER — METFORMIN HCL ER 500 MG PO TB24: 1000.0000 mg | ORAL_TABLET | Freq: Two times a day (BID) | ORAL | 3 refills | Status: DC

## 2020-08-01 MED ORDER — TRESIBA FLEXTOUCH 200 UNIT/ML ~~LOC~~ SOPN: 44.0000 [IU] | PEN_INJECTOR | Freq: Every day | SUBCUTANEOUS | 0 refills | Status: DC

## 2020-08-01 MED ORDER — OZEMPIC (1 MG/DOSE) 4 MG/3ML ~~LOC~~ SOPN: 1.0000 mg | PEN_INJECTOR | SUBCUTANEOUS | 3 refills | Status: DC

## 2020-08-01 NOTE — Patient Instructions (Addendum)
Mr. Hollister,   It was great talking to you today!  Please schedule podiatry follow up regarding that pain in your toes/toenails.   Continue [name] 5 mg (1/2 of 10 mg tablet) to see if you will tolerate that. Continue metformin 1000 mg twice daily.   When you complete your current supply of Ozempic 0.5 mg (red pen), increase to Ozempic 1 mg (blue pen). When you increase to the 1 mg weekly, reduce your [name] insulin to 44 units daily. Call me if any issues with low blood sugars.   Take care!  Catie Darnelle Maffucci, PharmD [phone]  Visit Information  PATIENT GOALS: Goals Addressed              This Visit's Progress     Patient Stated   .  Medication Monitoring (pt-stated)        Patient Goals/Self-Care Activities . Over the next 90 days, patient will:  - take medications as prescribed check blood glucose three times daily using CGM, document, and provide at future appointments check blood pressure twice daily, document, and provide at future appointments         The patient verbalized understanding of instructions, educational materials, and care plan provided today and agreed to receive a mailed copy of patient instructions, educational materials, and care plan.   Plan: Telephone follow up appointment with care management team member scheduled for:  ~ 8 weeks  Catie Darnelle Maffucci, PharmD, [place], [place] Clinical Pharmacist [employer] at [employer] [phone]

## 2020-08-01 NOTE — Chronic Care Management (AMB) (Signed)
Chronic Care Management Pharmacy Note  08/01/2020 Name:  Douglas [name] MRN:  [id number] DOB:  14-Aug-1945  Subjective: Douglas [name] is an 75 y.o. year old male who is a primary patient of [name], [name], MD.  The CCM team was consulted for assistance with disease management and care coordination needs.    Engaged with patient by telephone for follow up visit in response to provider referral for pharmacy case management and/or care coordination services.   Consent to Services:  The patient was given information about Chronic Care Management services, agreed to services, and gave verbal consent prior to initiation of services.  Please see initial visit note for detailed documentation.   Patient Care Team: [name], MD as PCP - General (Family Medicine) Wellington Hampshire, MD as PCP - Cardiology (Cardiology) Anthonette Legato, MD (Internal Medicine) Watt Climes, PA as Physician Assistant (Physician Assistant) De Hollingshead, RPH-CPP (Pharmacist)  Recent office visits: None since our last call  Recent consult visits: None since our last call  Hospital visits: None in previous 6 months  Objective:  Lab Results  Component Value Date   CREATININE 1.51 (H) 06/05/2020   CREATININE 1.45 06/10/2019   CREATININE 1.49 (H) 01/19/2019    Lab Results  Component Value Date   HGBA1C 7.6 (H) 06/05/2020   Last diabetic Eye exam:  Lab Results  Component Value Date/Time   HMDIABEYEEXA No Retinopathy 03/12/2020 12:00 AM    Last diabetic Foot exam:  Lab Results  Component Value Date/Time   HMDIABFOOTEX Normal 11/24/2014 12:00 AM        Component Value Date/Time   CHOL 115 11/23/2019 1350   CHOL 142 11/12/2018 1142   TRIG 174.0 (H) 11/23/2019 1350   HDL 40.80 11/23/2019 1350   HDL 49 11/12/2018 1142   CHOLHDL 3 11/23/2019 1350   VLDL 34.8 11/23/2019 1350   LDLCALC 40 11/23/2019 1350   LDLCALC 58 11/12/2018 1142    Hepatic Function Latest Ref  Rng & Units 11/23/2019 11/12/2018 08/19/2017  Total Protein 6.0 - 8.3 g/dL 6.5 6.3 6.6  Albumin 3.5 - 5.2 g/dL 4.1 4.3 3.8  AST 0 - 37 U/L 26 17 20   ALT 0 - 53 U/L 24 23 20   Alk Phosphatase 39 - 117 U/L 69 75 59  Total Bilirubin 0.2 - 1.2 mg/dL 0.7 0.6 0.6  Bilirubin, Direct 0.0 - 0.3 mg/dL 0.2 - -    Lab Results  Component Value Date/Time   TSH 1.72 03/02/2020 10:36 AM   TSH 3.72 04/21/2019 10:18 AM    CBC Latest Ref Rng & Units 01/19/2019 09/10/2017 02/18/2017  WBC 4.0 - 10.5 K/uL 10.6(H) 9.5 7.5  Hemoglobin 13.0 - 17.0 g/dL 11.8(L) 11.7(L) 13.0  Hematocrit 39.0 - 52.0 % 36.6(L) 36.0(L) 40.3  Platelets 150 - 400 K/uL 258 210 225.0    Lab Results  Component Value Date/Time   VD25OH 83 05/02/2013 01:18 PM    Clinical ASCVD: No    Social History   Tobacco Use  Smoking Status Never Smoker  Smokeless Tobacco Never Used   BP Readings from Last 3 Encounters:  06/05/20 138/84  05/03/20 (!) 171/81  03/28/20 (!) 144/79   Pulse Readings from Last 3 Encounters:  06/05/20 71  05/03/20 84  03/28/20 83   Wt Readings from Last 3 Encounters:  06/05/20 268 lb 4 oz (121.7 kg)  05/03/20 269 lb (122 kg)  03/02/20 282 lb (127.9 kg)    Assessment: Review of patient  past medical history, allergies, medications, health status, including review of consultants reports, laboratory and other test data, was performed as part of comprehensive evaluation and provision of chronic care management services.   SDOH:  (Social Determinants of Health) assessments and interventions performed:  SDOH Interventions   Flowsheet Row Most Recent Value  SDOH Interventions   Financial Strain Interventions Other (Comment)  [manufacturer assistance]      CCM Care Plan  Allergies  Allergen Reactions  . Bee Venom Swelling  . Dye Fdc Red [Red Dye] Swelling and Other (See Comments)    Reaction: gout  . Atenolol Other (See Comments)    Did not regulate blood pressure  . Benadryl [Diphenhydramine Hcl  (Sleep)] Other (See Comments)    Hyperactivity   . Enalapril Itching    itching  . Gabapentin Palpitations and Rash    rash  . Hctz [Hydrochlorothiazide] Other (See Comments)    Decreased potassium    Medications Reviewed Today    Reviewed by De Hollingshead, RPH-CPP (Pharmacist) on 08/01/20 at 1358  Med List Status: <None>  Medication Order Taking? Sig Documenting Provider Last Dose Status Informant  acetaminophen (TYLENOL) 500 MG tablet [id number]  Take 500 mg by mouth every 6 (six) hours as needed. [provider]  Active   albuterol (VENTOLIN HFA) 108 (90 Base) MCG/ACT inhaler [id number]  INHALE 2 PUFFS BY ORAL INHALATION AS NEEDED [provider]  Active   allopurinol (ZYLOPRIM) 300 MG tablet [id number] Yes TAKE 1 TABLET(300 MG) BY MOUTH DAILY [name], MD Taking Active   aspirin 81 MG tablet [id number] Yes Take 1 tablet (81 mg total) by mouth daily. Wellington Hampshire, MD Taking Active Self  BD PEN NEEDLE NANO 2ND GEN 32G X 4 MM MISC [id number]  USE DAILY [name], MD  Active   calcitRIOL (ROCALTROL) 0.25 MCG capsule [id number] Yes Take 0.25 mcg by mouth daily.  [provider] Taking Active Self  clove oil liquid [id number] Yes Apply 1 application topically as needed. [provider] Taking Active   dapagliflozin propanediol ([name]) 10 MG TABS tablet [id number] No Take 1 tablet (10 mg total) by mouth daily before breakfast.  Patient not taking: Reported on 08/01/2020   [name], MD Not Taking Active   DULoxetine (CYMBALTA) 60 MG capsule [id number] Yes TAKE 1 CAPSULE(60 MG) BY MOUTH DAILY [name], MD Taking Active   esomeprazole (NEXIUM) 40 MG capsule [id number] No Take 1 capsule (40 mg total) by mouth daily at 12 noon.  Patient not taking: Reported on 08/01/2020   Jackolyn Confer, MD Not Taking Active   finasteride (PROSCAR) 5 MG tablet [id number] Yes TAKE 1 TABLET(5 MG) BY MOUTH AT BEDTIME Stoioff, Ronda Fairly, MD  Taking Active   fluticasone (FLONASE) 50 MCG/ACT nasal spray [id number] Yes Place 2 sprays into both nostrils daily. [name], MD Taking Active   HYDROcodone-acetaminophen (NORCO/VICODIN) 5-325 MG tablet [id number] Yes Take 1 tablet by mouth every 6 (six) hours as needed for moderate pain. [name], MD Taking Active   insulin degludec ([name] FLEXTOUCH) 200 UNIT/ML FlexTouch Pen [id number] Yes Inject 52 Units into the skin daily. [provider] Taking Active   levothyroxine (SYNTHROID) 100 MCG tablet [id number] Yes TAKE 1 TABLET(100 MCG) BY MOUTH DAILY BEFORE BREAKFAST [name], MD Taking Active   loratadine (CLARITIN) 10 MG tablet [id number] Yes Take 10 mg by mouth daily as needed for allergies. [provider] Taking Active   Magnesium  250 MG TABS [id number] Yes Take 1 tablet by mouth daily. [provider] Taking Active   metFORMIN (GLUCOPHAGE-XR) 500 MG 24 hr tablet [id number] Yes TAKE 1 TABLET BY MOUTH IN THE MORNING AND 2 TABLETS IN THE Cleatis Polka, MD Taking Active            Med Note Darnelle Maffucci, Arville Lime   Wed Aug 01, 2020  1:36 PM) 2 tablet BID  montelukast (SINGULAIR) 10 MG tablet [id number] Yes Take 1 tablet by mouth daily. [provider] Taking Active   pregabalin (LYRICA) 100 MG capsule [id number] Yes TAKE 2 CAPSULES BY MOUTH TWICE DAILY [name], MD Taking Active   Semaglutide,0.25 or 0.5MG/DOS, (OZEMPIC, 0.25 OR 0.5 MG/DOSE,) 2 MG/1.5ML SOPN [id number] Yes Inject 0.5 mg into the skin once a week. [provider] Taking Active   simvastatin (ZOCOR) 40 MG tablet [id number] Yes TAKE 1 TABLET(40 MG) BY MOUTH AT BEDTIME [name], MD Taking Active   tadalafil (CIALIS) 20 MG tablet [id number] Yes Take by mouth. [provider] Taking Active   testosterone cypionate (DEPOTESTOSTERONE CYPIONATE) 200 MG/ML injection [id number] Yes Inject 200 mg into the muscle every 14 (fourteen) days.  [provider] Taking Active           Patient Active Problem List   Diagnosis Date Noted  . Skin exam, screening for cancer 03/02/2020  . Anemia in chronic kidney disease 02/18/2019  . Secondary hyperparathyroidism of renal origin ([place]) 02/18/2019  . Right kidney mass 01/20/2019  . Erectile dysfunction due to arterial insufficiency 11/09/2018  . Respiratory illness 11/05/2018  . Tick bite of knee, initial encounter 08/17/2018  . Left shoulder pain 07/02/2018  . Allergic rhinitis 07/02/2018  . Cervical radiculopathy 11/20/2017  . Toe pain, bilateral 11/20/2017  . Abdominal pain 02/18/2017  . Clavicle enlargement 02/18/2017  . Anxiety 02/18/2017  . Degenerative arthritis of knee, bilateral 09/04/2016  . BPH (benign prostatic hyperplasia) 08/18/2016  . Knee osteoarthritis 08/18/2016  . Hyperlipidemia 05/20/2016  . Essential hypertension 05/31/2015  . Low testosterone 02/27/2015  . Diabetic neuropathy ([place]) 01/05/2013  . Obesity (BMI 30-39.9) 01/05/2013  . Chronic back pain greater than 3 months duration 01/05/2013  . Gout 01/05/2013  . OSA (obstructive sleep apnea) 01/05/2013  . Chronic kidney disease 01/04/2013  . DM type 2 with diabetic peripheral neuropathy ([place]) 01/04/2013  . Hypothyroidism 01/04/2013    Immunization History  Administered Date(s) Administered  . Fluad Quad(high Dose 65+) 04/21/2019, 03/02/2020  . Influenza, High Dose Seasonal PF 02/15/2016, 02/16/2017, 03/01/2018  . Influenza,inj,Quad PF,6+ Mos 01/04/2013, 02/20/2014, 02/27/2015  . Moderna Sars-Covid-2 Vaccination 10/10/2019, 11/07/2019  . Pneumococcal Conjugate-13 05/02/2013  . Pneumococcal Polysaccharide-23 01/05/2011  . Tdap 01/05/2008    Conditions to be addressed/monitored: HTN, HLD and DMII  Care Plan : Medication Management  Updates made by De Hollingshead, RPH-CPP since 08/01/2020 12:00 AM    Problem: Diabetes, Hypertension     Long-Range Goal: Disease Progression  Prevention   Start Date: 01/11/2020  This Visit's Progress: On track  Recent Progress: On track  Priority: High  Note:   Current Barriers:  . Unable to independently afford treatment regimen . Unable to achieve control of diabetes   Pharmacist Clinical Goal(s):  Marland Kitchen Over the next 90 days, patient will verbalize ability to afford treatment regimen. . Over the next 90 days, patient will achieve control of diabetes as evidenced by improvement in A1c through collaboration with PharmD and provider.   Interventions: . 1:1  collaboration with [name], MD regarding development and update of comprehensive plan of care as evidenced by provider attestation and co-signature . Inter-disciplinary care team collaboration (see longitudinal plan of care) . Comprehensive medication review performed; medication list updated in electronic medical record  Health Maintenance: . Reports toe pain/toe nail pain. Took hydrocodone/acetaminophen for this pain last night after clove oil, salon pas topically did not help. Previously saw podiatrist, has not been seen for several months. Recommended he schedule follow up with podiatry for foot care.   Diabetes: . Uncontrolled; current treatment: Ozempic 0.5 mg weekly - to increase to 1 mg when he completes current supply, metformin XR 1000 mg BID, [name] 52 units daily; [name] 10 mg daily - has been holding for the past few days. Reports significant dry mouth on [name] 10 mg daily. Reports that also results in acid reflux.  . Recently received order for Ozempic 1 mg and [name] U200 weekly.  . Current glucose readings: Libre 2, last 14 days % Time CGM is active: % Average Glucose: 140 mg/dL - midnight- 6 am: 138; 6 am-noon: 120; noon- 6 pm: 146; 6 pm-midnight: 168 Time in Goal:  - Time in range 70-180: 92% - Time above range: 8% - Time below range: 0% . Continue metformin 1000 mg BID, Ozempic 0.5 mg weekly, [name] 52 units daily, and [name] 5 mg daily.  Plan to increase Ozempic to 1 mg once 0.5 mg supply complete. He thinks he has 1-2 weeks of 0.5 mg weekly remaining. Once Ozempic is increased to 1 mg weekly, reduce [name] to 44 units daily.  . Patient notes he also plans on cutting back on carb snacking.   Hypertension: . Controlled per home readings; current treatment: losartan 25 mg QAM, 50 mg QPM, doxazosin 2 mg QPM, carvedilol 6.25 mg BID; furosemide 40 mg QAM o Notes dizziness w/ higher dose of doxazosin o Afternoon dizziness/sleepiness w/ losartan 50 mg dose in the morning o Hx hypokalemia w/ HCTZ o Itching w/ enalapril o Reported lack of benefit w/ generic atenolol o No hx spironolactone.  . Recommend to continue current regimen at this time. PCP f/u in ~ 6 weeks.   Hyperlipidemia: . Controlled per last lab work; current treatment: simvastatin 40 mg daily . Recommended to continue current regimen . PCP f/u in ~ 6 weeks, lipid panel expected.   Peripheral Neuropathy, Chronic Back Pain: . Controlled at this time current treatment: duloxetine 60 mg daily, pregabalin 200 mg BID, hydocodone-APAP 5/325 mg PRN (reports that a prescription for 30 tablets will last about a year), topical clove oil and Salonpas; PT for mobility support; cyclobenzaprine 10 mg PRN  . See above note about foot pain. Podiatry evaluation suggested.  . Recommended to continue current regimen.   CKD: . Stable. Current treatment: calcitriol 0.25 mg daily. Follows w/ Dr. Holley Raring . Recommended to continue current regimen and collaboration w/ nephrology at this time. Discussed CKD benefit of [name], hence patient being interested in retrial.   Gout: . Controlled, patient has not had a gout flare since initial diagnosis; current treatment: allopurinol 300 mg daily . Recommended to continue current regimen at this time  Patient Goals/Self-Care Activities . Over the next 90 days, patient will:  - take medications as prescribed check glucose three times daily using  CGM, document, and provide at future appointments check blood pressure daily, document, and provide at future appointments collaborate with provider on medication access solutions   Follow Up Plan: Telephone follow up appointment with  care management team member scheduled for: ~ 8 weeks      Medication Assistance: [name], [name] obtained through [place], [place] medication assistance program.  Enrollment ends 03/13/21 for [employer] products, 04/13/21 for [place] products  Patient's preferred pharmacy is:  [id number] [address], [place] [address] Phone: [phone] Fax: [phone]  Uses pill box? Yes Pt endorses 100% compliance  Follow Up:  Patient agrees to Care Plan and Follow-up.  Plan: Telephone follow up appointment with care management team member scheduled for:  ~ 8 weeks  Catie Darnelle Maffucci, PharmD, [place], [place] Clinical Pharmacist [employer] [facility] [employer] [phone]

## 2020-08-06 ENCOUNTER — Other Ambulatory Visit: Payer: Self-pay | Admitting: Internal Medicine

## 2020-08-08 ENCOUNTER — Telehealth: Payer: Self-pay | Admitting: Family Medicine

## 2020-08-08 ENCOUNTER — Ambulatory Visit: Payer: Medicare Other | Admitting: Pharmacist

## 2020-08-08 DIAGNOSIS — I10 Essential (primary) hypertension: Secondary | ICD-10-CM | POA: Diagnosis not present

## 2020-08-08 DIAGNOSIS — E785 Hyperlipidemia, unspecified: Secondary | ICD-10-CM

## 2020-08-08 DIAGNOSIS — N1831 Chronic kidney disease, stage 3a: Secondary | ICD-10-CM | POA: Diagnosis not present

## 2020-08-08 DIAGNOSIS — E118 Type 2 diabetes mellitus with unspecified complications: Secondary | ICD-10-CM | POA: Diagnosis not present

## 2020-08-08 DIAGNOSIS — E1142 Type 2 diabetes mellitus with diabetic polyneuropathy: Secondary | ICD-10-CM | POA: Diagnosis not present

## 2020-08-08 NOTE — Telephone Encounter (Signed)
Faxed the paperwork today, confirmation given.  Bana Borgmeyer,cma

## 2020-08-08 NOTE — Telephone Encounter (Signed)
Called [employer] back. They were letting us know that they were faxing over a certificate of medical necessity to be filled out. Faxed today. Gae Bon, will you be on the lookout for this and complete when you receive?  Patient is currently using CGM and checking glucose 3-4 times daily. Injecting 1 shot of insulin daily.

## 2020-08-08 NOTE — Chronic Care Management (AMB) (Signed)
Chronic Care Management Pharmacy Note  08/08/2020 Name:  Douglas [name] MRN:  [id number] DOB:  [birth year]  Subjective: [name] Twitty is an 75 y.o. year old male who is a primary patient of [name], [name], MD.  The CCM team was consulted for assistance with disease management and care coordination needs.    Care coordination for device access in response to provider referral for pharmacy case management and/or care coordination services.   Consent to Services:  The patient was given information about Chronic Care Management services, agreed to services, and gave verbal consent prior to initiation of services.  Please see initial visit note for detailed documentation.   Patient Care Team: [name], MD as PCP - General (Family Medicine) Wellington Hampshire, MD as PCP - Cardiology (Cardiology) Anthonette Legato, MD (Internal Medicine) Watt Climes, PA as Physician Assistant (Physician Assistant) De Hollingshead, RPH-CPP (Pharmacist)  Recent office visits: None since our last call  Recent consult visits: None since our last call  Hospital visits: None in previous 6 months  Objective:  Lab Results  Component Value Date   CREATININE 1.51 (H) 06/05/2020   CREATININE 1.45 06/10/2019   CREATININE 1.49 (H) 01/19/2019    Lab Results  Component Value Date   HGBA1C 7.6 (H) 06/05/2020   Last diabetic Eye exam:  Lab Results  Component Value Date/Time   HMDIABEYEEXA No Retinopathy 03/12/2020 12:00 AM    Last diabetic Foot exam:  Lab Results  Component Value Date/Time   HMDIABFOOTEX Normal 11/24/2014 12:00 AM        Component Value Date/Time   CHOL 115 11/23/2019 1350   CHOL 142 11/12/2018 1142   TRIG 174.0 (H) 11/23/2019 1350   HDL 40.80 11/23/2019 1350   HDL 49 11/12/2018 1142   CHOLHDL 3 11/23/2019 1350   VLDL 34.8 11/23/2019 1350   LDLCALC 40 11/23/2019 1350   LDLCALC 58 11/12/2018 1142    Hepatic Function Latest Ref Rng & Units  11/23/2019 11/12/2018 08/19/2017  Total Protein 6.0 - 8.3 g/dL 6.5 6.3 6.6  Albumin 3.5 - 5.2 g/dL 4.1 4.3 3.8  AST 0 - 37 U/L 26 17 20   ALT 0 - 53 U/L 24 23 20   Alk Phosphatase 39 - 117 U/L 69 75 59  Total Bilirubin 0.2 - 1.2 mg/dL 0.7 0.6 0.6  Bilirubin, Direct 0.0 - 0.3 mg/dL 0.2 - -    Lab Results  Component Value Date/Time   TSH 1.72 03/02/2020 10:36 AM   TSH 3.72 04/21/2019 10:18 AM    CBC Latest Ref Rng & Units 01/19/2019 09/10/2017 02/18/2017  WBC 4.0 - 10.5 K/uL 10.6(H) 9.5 7.5  Hemoglobin 13.0 - 17.0 g/dL 11.8(L) 11.7(L) 13.0  Hematocrit 39.0 - 52.0 % 36.6(L) 36.0(L) 40.3  Platelets 150 - 400 K/uL 258 210 225.0    Lab Results  Component Value Date/Time   VD25OH 83 05/02/2013 01:18 PM    Clinical ASCVD: No    Social History   Tobacco Use  Smoking Status Never Smoker  Smokeless Tobacco Never Used   BP Readings from Last 3 Encounters:  06/05/20 138/84  05/03/20 (!) 171/81  03/28/20 (!) 144/79   Pulse Readings from Last 3 Encounters:  06/05/20 71  05/03/20 84  03/28/20 83   Wt Readings from Last 3 Encounters:  06/05/20 268 lb 4 oz (121.7 kg)  05/03/20 269 lb (122 kg)  03/02/20 282 lb (127.9 kg)    Assessment: Review of patient past medical history, allergies,  medications, health status, including review of consultants reports, laboratory and other test data, was performed as part of comprehensive evaluation and provision of chronic care management services.   SDOH:  (Social Determinants of Health) assessments and interventions performed:  SDOH Interventions   Flowsheet Row Most Recent Value  SDOH Interventions   Financial Strain Interventions Other (Comment)  [manufacturer assistance]      CCM Care Plan  Allergies  Allergen Reactions  . Bee Venom Swelling  . Dye Fdc Red [Red Dye] Swelling and Other (See Comments)    Reaction: gout  . Atenolol Other (See Comments)    Did not regulate blood pressure  . Benadryl [Diphenhydramine Hcl (Sleep)] Other  (See Comments)    Hyperactivity   . Enalapril Itching    itching  . Gabapentin Palpitations and Rash    rash  . Hctz [Hydrochlorothiazide] Other (See Comments)    Decreased potassium    Medications Reviewed Today    Reviewed by De Hollingshead, RPH-CPP (Pharmacist) on 08/01/20 at 1358  Med List Status: <None>  Medication Order Taking? Sig Documenting Provider Last Dose Status Informant  acetaminophen (TYLENOL) 500 MG tablet [id number]  Take 500 mg by mouth every 6 (six) hours as needed. [provider]  Active   albuterol (VENTOLIN HFA) 108 (90 Base) MCG/ACT inhaler [id number]  INHALE 2 PUFFS BY ORAL INHALATION AS NEEDED [provider]  Active   allopurinol (ZYLOPRIM) 300 MG tablet [id number] Yes TAKE 1 TABLET(300 MG) BY MOUTH DAILY [name], MD Taking Active   aspirin 81 MG tablet [id number] Yes Take 1 tablet (81 mg total) by mouth daily. Wellington Hampshire, MD Taking Active Self  BD PEN NEEDLE NANO 2ND GEN 32G X 4 MM MISC [id number]  USE DAILY [name], MD  Active   calcitRIOL (ROCALTROL) 0.25 MCG capsule [id number] Yes Take 0.25 mcg by mouth daily.  [provider] Taking Active Self  clove oil liquid [id number] Yes Apply 1 application topically as needed. [provider] Taking Active   dapagliflozin propanediol ([name]) 10 MG TABS tablet [id number] No Take 1 tablet (10 mg total) by mouth daily before breakfast.  Patient not taking: Reported on 08/01/2020   [name], MD Not Taking Active   DULoxetine (CYMBALTA) 60 MG capsule [id number] Yes TAKE 1 CAPSULE(60 MG) BY MOUTH DAILY [name], MD Taking Active   esomeprazole (NEXIUM) 40 MG capsule [id number] No Take 1 capsule (40 mg total) by mouth daily at 12 noon.  Patient not taking: Reported on 08/01/2020   Jackolyn Confer, MD Not Taking Active   finasteride (PROSCAR) 5 MG tablet [id number] Yes TAKE 1 TABLET(5 MG) BY MOUTH AT BEDTIME Stoioff, Ronda Fairly, MD Taking Active    fluticasone (FLONASE) 50 MCG/ACT nasal spray [id number] Yes Place 2 sprays into both nostrils daily. [name], MD Taking Active   HYDROcodone-acetaminophen (NORCO/VICODIN) 5-325 MG tablet [id number] Yes Take 1 tablet by mouth every 6 (six) hours as needed for moderate pain. [name], MD Taking Active   insulin degludec ([name] FLEXTOUCH) 200 UNIT/ML FlexTouch Pen [id number] Yes Inject 52 Units into the skin daily. [provider] Taking Active   levothyroxine (SYNTHROID) 100 MCG tablet [id number] Yes TAKE 1 TABLET(100 MCG) BY MOUTH DAILY BEFORE BREAKFAST [name], MD Taking Active   loratadine (CLARITIN) 10 MG tablet [id number] Yes Take 10 mg by mouth daily as needed for allergies. [provider] Taking Active   Magnesium 250 MG TABS [id number]  Yes Take 1 tablet by mouth daily. [provider] Taking Active   metFORMIN (GLUCOPHAGE-XR) 500 MG 24 hr tablet [id number] Yes TAKE 1 TABLET BY MOUTH IN THE MORNING AND 2 TABLETS IN THE Cleatis Polka, MD Taking Active            Med Note Darnelle Maffucci, Arville Lime   Wed Aug 01, 2020  1:36 PM) 2 tablet BID  montelukast (SINGULAIR) 10 MG tablet [id number] Yes Take 1 tablet by mouth daily. [provider] Taking Active   pregabalin (LYRICA) 100 MG capsule [id number] Yes TAKE 2 CAPSULES BY MOUTH TWICE DAILY [name], MD Taking Active   Semaglutide,0.25 or 0.5MG/DOS, (OZEMPIC, 0.25 OR 0.5 MG/DOSE,) 2 MG/1.5ML SOPN [id number] Yes Inject 0.5 mg into the skin once a week. [provider] Taking Active   simvastatin (ZOCOR) 40 MG tablet [id number] Yes TAKE 1 TABLET(40 MG) BY MOUTH AT BEDTIME [name], MD Taking Active   tadalafil (CIALIS) 20 MG tablet [id number] Yes Take by mouth. [provider] Taking Active   testosterone cypionate (DEPOTESTOSTERONE CYPIONATE) 200 MG/ML injection [id number] Yes Inject 200 mg into the muscle every 14 (fourteen) days. [provider] Taking Active           Patient Active Problem List   Diagnosis Date Noted  . Skin exam, screening for cancer 03/02/2020  . Anemia in chronic kidney disease 02/18/2019  . Secondary hyperparathyroidism of renal origin ([place]) 02/18/2019  . Right kidney mass 01/20/2019  . Erectile dysfunction due to arterial insufficiency 11/09/2018  . Respiratory illness 11/05/2018  . Tick bite of knee, initial encounter 08/17/2018  . Left shoulder pain 07/02/2018  . Allergic rhinitis 07/02/2018  . Cervical radiculopathy 11/20/2017  . Toe pain, bilateral 11/20/2017  . Abdominal pain 02/18/2017  . Clavicle enlargement 02/18/2017  . Anxiety 02/18/2017  . Degenerative arthritis of knee, bilateral 09/04/2016  . BPH (benign prostatic hyperplasia) 08/18/2016  . Knee osteoarthritis 08/18/2016  . Hyperlipidemia 05/20/2016  . Essential hypertension 05/31/2015  . Low testosterone 02/27/2015  . Diabetic neuropathy ([place]) 01/05/2013  . Obesity (BMI 30-39.9) 01/05/2013  . Chronic back pain greater than 3 months duration 01/05/2013  . Gout 01/05/2013  . OSA (obstructive sleep apnea) 01/05/2013  . Chronic kidney disease 01/04/2013  . DM type 2 with diabetic peripheral neuropathy ([place]) 01/04/2013  . Hypothyroidism 01/04/2013    Immunization History  Administered Date(s) Administered  . Fluad Quad(high Dose 65+) 04/21/2019, 03/02/2020  . Influenza, High Dose Seasonal PF 02/15/2016, 02/16/2017, 03/01/2018  . Influenza,inj,Quad PF,6+ Mos 01/04/2013, 02/20/2014, 02/27/2015  . Moderna Sars-Covid-2 Vaccination 10/10/2019, 11/07/2019  . Pneumococcal Conjugate-13 05/02/2013  . Pneumococcal Polysaccharide-23 01/05/2011  . Tdap 01/05/2008    Conditions to be addressed/monitored: HTN, HLD and DMII, CKD  Care Plan : Medication Management  Updates made by De Hollingshead, RPH-CPP since 08/08/2020 12:00 AM    Problem: Diabetes, Hypertension     Long-Range Goal: Disease Progression Prevention    Start Date: 01/11/2020  Recent Progress: On track  Priority: High  Note:   Current Barriers:  . Unable to independently afford treatment regimen . Unable to achieve control of diabetes   Pharmacist Clinical Goal(s):  Marland Kitchen Over the next 90 days, patient will verbalize ability to afford treatment regimen. . Over the next 90 days, patient will achieve control of diabetes as evidenced by improvement in A1c through collaboration with PharmD and provider.   Interventions: . 1:1 collaboration with [name], MD regarding development and  update of comprehensive plan of care as evidenced by provider attestation and co-signature . Inter-disciplinary care team collaboration (see longitudinal plan of care) . Comprehensive medication review performed; medication list updated in electronic medical record  Health Maintenance: . Podiatry f/u scheduled   Diabetes: . Uncontrolled; current treatment: Ozempic 0.5 mg weekly - to increase to 1 mg when he completes current supply, metformin XR 1000 mg BID, [name] 52 units daily; [name] 10 mg daily - plan to try 5 mg daily . Moving forward - if eGFR remains >45, will continue metformin 1000 mg BID. If renal function drops to <45, recommend reducing to 500 mg BID.  Marland Kitchen Receiving patient assistance for [name], [name]  . Current glucose readings: Libre 2- received call from [employer] [facility] that a new certificate of medical necessity  . Contacted [employer] [facility]. They were just calling to let us know that they were faxing over paperwork to be completed for continued CGM use. Will collaborate w/ office staff to complete.  Hypertension: . Controlled per home readings; current treatment: losartan 25 mg QAM, 50 mg QPM, doxazosin 2 mg QPM, carvedilol 6.25 mg BID; furosemide 40 mg QAM o Notes dizziness w/ higher dose of doxazosin o Afternoon dizziness/sleepiness w/ losartan 50 mg dose in the morning o Hx hypokalemia w/  HCTZ o Itching w/ enalapril o Reported lack of benefit w/ generic atenolol o No hx spironolactone.  . Recommend to continue current regimen at this time. PCP f/u in ~ 6 weeks.   Hyperlipidemia: . Controlled per last lab work; current treatment: simvastatin 40 mg daily . Recommended to continue current regimen . PCP f/u in ~ 6 weeks, lipid panel expected.   Peripheral Neuropathy, Chronic Back Pain: . Controlled at this time current treatment: duloxetine 60 mg daily, pregabalin 200 mg BID, hydocodone-APAP 5/325 mg PRN (reports that a prescription for 30 tablets will last about a year), topical clove oil and Salonpas; PT for mobility support; cyclobenzaprine 10 mg PRN  . Podiatry evaluation scheduled. . Recommended to continue current regimen.   CKD: . Stable. Current treatment: calcitriol 0.25 mg daily. Follows w/ Dr. Holley Raring . Recommended to continue current regimen and collaboration w/ nephrology at this time. Discussed CKD benefit of [name], hence patient being interested in retrial.   Gout: . Controlled, patient has not had a gout flare since initial diagnosis; current treatment: allopurinol 300 mg daily . Recommended to continue current regimen at this time  Patient Goals/Self-Care Activities . Over the next 90 days, patient will:  - take medications as prescribed check glucose three times daily using CGM, document, and provide at future appointments check blood pressure daily, document, and provide at future appointments collaborate with provider on medication access solutions   Follow Up Plan: Telephone follow up appointment with care management team member scheduled for: ~ 6 weeks as previously scheduled      Medication Assistance:Medication Assistance: [name], [name] obtained through [place], [place] medication assistance program.  Enrollment ends 03/13/21 for Nov[employer] products, 04/13/21 for [place] products  Patient's preferred pharmacy is:  [id number] [address], [place] [address] Phone: [phone] Fax: [phone]    Follow Up:  Patient agrees to Care Plan and Follow-up.  Plan: Telephone follow up appointment with care management team member scheduled for:  ~ 6 weeks as previously scheduled   Catie Darnelle Maffucci, PharmD, [place], [place] Clinical Pharmacist [facility] [employer] [phone]

## 2020-08-08 NOTE — Patient Instructions (Signed)
Visit Information  PATIENT GOALS: Goals Addressed              This Visit's Progress     Patient Stated   .  Medication Monitoring (pt-stated)        Patient Goals/Self-Care Activities . Over the next 90 days, patient will:  - take medications as prescribed check blood glucose three times daily using CGM, document, and provide at future appointments check blood pressure twice daily, document, and provide at future appointments          Patient verbalizes understanding of instructions provided today and agrees to view in [place].   Plan: Telephone follow up appointment with care management team member scheduled for:  ~ 6 weeks as previously scheduled   Catie Darnelle Maffucci, PharmD, [place], [place] Clinical Pharmacist [employer] [facility] [employer] [phone]

## 2020-08-08 NOTE — Telephone Encounter (Signed)
[facility] called in regarding to a cert of medical neccessary

## 2020-08-10 ENCOUNTER — Other Ambulatory Visit: Payer: Self-pay | Admitting: Family Medicine

## 2020-08-10 ENCOUNTER — Ambulatory Visit (INDEPENDENT_AMBULATORY_CARE_PROVIDER_SITE_OTHER): Payer: Medicare Other | Admitting: Podiatry

## 2020-08-10 ENCOUNTER — Other Ambulatory Visit: Payer: Self-pay

## 2020-08-10 ENCOUNTER — Encounter: Payer: Self-pay | Admitting: Podiatry

## 2020-08-10 DIAGNOSIS — B351 Tinea unguium: Secondary | ICD-10-CM

## 2020-08-10 DIAGNOSIS — M7752 Other enthesopathy of left foot: Secondary | ICD-10-CM

## 2020-08-10 DIAGNOSIS — K219 Gastro-esophageal reflux disease without esophagitis: Secondary | ICD-10-CM | POA: Insufficient documentation

## 2020-08-10 DIAGNOSIS — E291 Testicular hypofunction: Secondary | ICD-10-CM | POA: Insufficient documentation

## 2020-08-10 DIAGNOSIS — L989 Disorder of the skin and subcutaneous tissue, unspecified: Secondary | ICD-10-CM | POA: Diagnosis not present

## 2020-08-10 DIAGNOSIS — M79674 Pain in right toe(s): Secondary | ICD-10-CM

## 2020-08-10 DIAGNOSIS — R945 Abnormal results of liver function studies: Secondary | ICD-10-CM | POA: Insufficient documentation

## 2020-08-10 DIAGNOSIS — M79675 Pain in left toe(s): Secondary | ICD-10-CM

## 2020-08-10 DIAGNOSIS — M545 Low back pain, unspecified: Secondary | ICD-10-CM | POA: Insufficient documentation

## 2020-08-10 DIAGNOSIS — G2581 Restless legs syndrome: Secondary | ICD-10-CM | POA: Insufficient documentation

## 2020-08-13 NOTE — Telephone Encounter (Signed)
RX Refill:lyrica Last Seen:06-05-20 Last ordered:03-01-20

## 2020-08-28 DIAGNOSIS — M79675 Pain in left toe(s): Secondary | ICD-10-CM | POA: Diagnosis not present

## 2020-08-28 DIAGNOSIS — M79674 Pain in right toe(s): Secondary | ICD-10-CM | POA: Diagnosis not present

## 2020-08-28 DIAGNOSIS — M7752 Other enthesopathy of left foot: Secondary | ICD-10-CM | POA: Diagnosis not present

## 2020-08-28 DIAGNOSIS — L989 Disorder of the skin and subcutaneous tissue, unspecified: Secondary | ICD-10-CM | POA: Diagnosis not present

## 2020-08-28 DIAGNOSIS — B351 Tinea unguium: Secondary | ICD-10-CM | POA: Diagnosis not present

## 2020-08-28 MED ORDER — BETAMETHASONE SOD PHOS & ACET 6 (3-3) MG/ML IJ SUSP
3.0000 mg | Freq: Once | INTRAMUSCULAR | Status: AC
  Administered 2020-08-28: 3 mg via INTRA_ARTICULAR

## 2020-08-28 NOTE — Progress Notes (Signed)
SUBJECTIVE Patient presents to office today complaining of elongated, thickened nails that cause pain while ambulating in shoes.  He is unable to trim his own nails.  He is also been complaining of pain to the medial border of the bilateral great toenails.  He believes the rescanning callus buildup in that area.  Patient is here for further evaluation and treatment.   Patient also has a new complaint today regarding some pain and sensitivity to the left ankle joint is been going on for a few weeks now.  He denies a history of injury.  He states that it is symptomatic and painful with his shoes.  He presents for further treatment evaluation.  Currently he has not done anything to treat this  Past Medical History:  Diagnosis Date  . Arthritis   . Depression   . Diabetes mellitus with complication ([place])   . Diabetic neuropathy ([place])   . Diastolic dysfunction    a. TTE 07/2017: EF 60-65%, mild concentric LVH, no RWMA, Gr1DD, trivial AI, mildly dilated LA, RVSF normal, PASP normal  . Edema    feet/legs  . GERD (gastroesophageal reflux disease)   . Gout   . History of stress test    a. MV 06/2017: small in size, mild in severity, apical anterior and apical defect that was minimally reversible and most likely represented artifact and less likely ischemia/scar, LVEF 55-65%, low risk, probably normal stress test  . Hypertension   . Hypothyroidism   . Kidney cysts    renal failure 2013  . Kidney stones    Dr. Rogers Blocker  . OSA (obstructive sleep apnea)    supplemental oxygen at night  . Oxygen dependent    hs  . [place] spotted fever 12/10/2017   Positve IgG in titer on 11/20/17  . S/P cardiac cath 1998   a. no obstructive disease  . Syncope and collapse   . Temporary low platelet count ([place])     OBJECTIVE General Patient is awake, alert, and oriented x 3 and in no acute distress. Derm Skin is dry and supple bilateral. Negative open lesions or macerations. Remaining integument  unremarkable. Nails are tender, long, thickened and dystrophic with subungual debris, consistent with onychomycosis, 1-5 bilateral. No signs of infection noted.  Hyperkeratotic preulcerative callus tissue noted to the bilateral toes Vasc  DP and PT pedal pulses palpable bilaterally. Temperature gradient within normal limits.  Neuro Epicritic and protective threshold sensation grossly intact bilaterally.  Musculoskeletal Exam No symptomatic pedal deformities noted bilateral. Muscular strength within normal limits.  Today there is pain on palpation to the anterior aspect of the left ankle joint.  Pain with range of motion as well.  ASSESSMENT 1. Onychodystrophic nails 1-5 bilateral with hyperkeratosis of nails.  2. Onychomycosis of nail due to dermatophyte bilateral 3. Pain in foot bilateral 4.  Hyperkeratotic painful callus tissue bilateral great toes 5.  Capsulitis left ankle joint  PLAN OF CARE 1. Patient evaluated today.  2. Instructed to maintain good pedal hygiene and foot care.  3. Mechanical debridement of nails 1-5 bilaterally performed using a nail nipper. Filed with dremel without incident.  4.  Excisional debridement of the hyperkeratotic callus tissue was performed using a tissue nipper without incident or bleeding  5.  Injection of 0.5 cc Celestone Soluspan injection to the anterior aspect of the left ankle joint  6.  Return to clinic in 3 mos.    Edrick Kins, DPM [facility]  Dr.  Edrick Kins, DPM    [address]                                        [place], [place] [address]                Office [phone]  Fax [phone]

## 2020-09-03 ENCOUNTER — Ambulatory Visit: Payer: Medicare Other | Admitting: Family Medicine

## 2020-09-04 ENCOUNTER — Telehealth: Payer: Self-pay | Admitting: Internal Medicine

## 2020-09-04 NOTE — Telephone Encounter (Signed)
Who declined this?

## 2020-09-04 NOTE — Telephone Encounter (Signed)
It was taken off med list due to a duplication,a CMA did it during office visit back in march.

## 2020-09-04 NOTE — Telephone Encounter (Signed)
PT is calling in to get a refill of his Furosemide 40 mg. He states he got a call from the Rx that stated it was rejected by the doctor office.

## 2020-09-05 ENCOUNTER — Ambulatory Visit (INDEPENDENT_AMBULATORY_CARE_PROVIDER_SITE_OTHER): Payer: Medicare Other

## 2020-09-05 VITALS — Ht 71.0 in | Wt 268.0 lb

## 2020-09-05 DIAGNOSIS — Z0000 Encounter for general adult medical examination without abnormal findings: Secondary | ICD-10-CM | POA: Diagnosis not present

## 2020-09-05 MED ORDER — FUROSEMIDE 40 MG PO TABS: 40.0000 mg | ORAL_TABLET | Freq: Every day | ORAL | 1 refills | Status: DC

## 2020-09-05 NOTE — Progress Notes (Signed)
Subjective:   EFFREY DAVIDOW is a 75 y.o. male who presents for Medicare Annual/Subsequent preventive examination.  Review of Systems    No ROS.  Medicare Wellness Virtual Visit.  Visual/audio telehealth visit, UTA vital signs.   See social history for additional risk factors.   Cardiac Risk Factors include: advanced age (>87men, >50 women);diabetes mellitus;male gender;hypertension     Objective:    Today's Vitals   09/05/20 1123  Weight: 268 lb (121.6 kg)  Height: 5\' 11"  (1.803 m)   Body mass index is 37.38 kg/m.  Advanced Directives 09/05/2020 09/05/2019 01/19/2019 12/10/2018 08/09/2018 07/03/2017 08/13/2016  Does Patient Have a Medical Advance Directive? Yes Yes No No Yes No No  Type of Paramedic of [place];Living will [place];Living will - - [place];Living will - -  Does patient want to make changes to medical advance directive? No - Patient declined No - Patient declined - - No - Patient declined - -  Copy of [facility] in Chart? No - copy requested No - copy requested - - No - copy requested - -  Would patient like information on creating a medical advance directive? - - - - - No - Patient declined No - Patient declined    Current Medications (verified) Outpatient Encounter Medications as of 09/05/2020  Medication Sig  . acetaminophen (TYLENOL) 500 MG tablet Take 500 mg by mouth every 6 (six) hours as needed.  Marland Kitchen albuterol (VENTOLIN HFA) 108 (90 Base) MCG/ACT inhaler INHALE 2 PUFFS BY ORAL INHALATION AS NEEDED  . allopurinol (ZYLOPRIM) 300 MG tablet TAKE 1 TABLET(300 MG) BY MOUTH DAILY  . amLODipine (NORVASC) 10 MG tablet TAKE ONE-HALF TABLET BY MOUTH ONCE EVERY DAY  . aspirin 81 MG tablet Take 1 tablet (81 mg total) by mouth daily.  . BD PEN NEEDLE NANO 2ND GEN 32G X 4 MM MISC USE DAILY  . calcitRIOL (ROCALTROL) 0.25 MCG capsule Take 0.25 mcg by mouth daily.   . clove oil liquid Apply  1 application topically as needed.  . cyclobenzaprine (FLEXERIL) 10 MG tablet Take 1 tablet by mouth as needed.  . dapagliflozin propanediol ([name]) 10 MG TABS tablet Take 1 tablet (10 mg total) by mouth daily before breakfast. (Patient not taking: No sig reported)  . DULoxetine (CYMBALTA) 60 MG capsule TAKE 1 CAPSULE(60 MG) BY MOUTH DAILY  . esomeprazole (NEXIUM) 40 MG capsule Take 1 capsule (40 mg total) by mouth daily at 12 noon.  . finasteride (PROSCAR) 5 MG tablet TAKE 1 TABLET(5 MG) BY MOUTH AT BEDTIME  . fluticasone (FLONASE) 50 MCG/ACT nasal spray Place 2 sprays into both nostrils daily.  . furosemide (LASIX) 40 MG tablet Take 1 tablet (40 mg total) by mouth daily.  Marland Kitchen HYDROcodone-acetaminophen (NORCO/VICODIN) 5-325 MG tablet Take 1 tablet by mouth every 6 (six) hours as needed for moderate pain.  Marland Kitchen insulin degludec ([name] FLEXTOUCH) 200 UNIT/ML FlexTouch Pen Inject 44 Units into the skin daily.  Marland Kitchen levothyroxine (SYNTHROID) 100 MCG tablet TAKE 1 TABLET(100 MCG) BY MOUTH DAILY BEFORE BREAKFAST  . loratadine (CLARITIN) 10 MG tablet Take 10 mg by mouth daily as needed for allergies.  . Magnesium 250 MG TABS Take 1 tablet by mouth daily.  . metFORMIN (GLUCOPHAGE-XR) 500 MG 24 hr tablet Take 2 tablets (1,000 mg total) by mouth 2 (two) times daily. TAKE 1 TABLET BY MOUTH IN THE MORNING AND 2 TABLETS IN THE EVENING  . minoxidil (LONITEN) 10 MG  tablet Take 1 tablet by mouth daily.  . montelukast (SINGULAIR) 10 MG tablet Take 1 tablet by mouth daily.  . nebivolol (BYSTOLIC) 10 MG tablet TAKE TWO TABLETS BY MOUTH ONCE EVERY DAY (Patient not taking: Reported on 08/10/2020)  . piroxicam (FELDENE) 20 MG capsule TAKE 1 CAPSULE BY MOUTH ONCE EVERY DAY  . pregabalin (LYRICA) 100 MG capsule TAKE 2 CAPSULES BY MOUTH TWICE DAILY  . Semaglutide, 1 MG/DOSE, (OZEMPIC, 1 MG/DOSE,) 4 MG/3ML SOPN Inject 1 mg into the skin once a week.  . simvastatin (ZOCOR) 40 MG tablet TAKE 1 TABLET(40 MG) BY MOUTH AT BEDTIME   . tadalafil (CIALIS) 20 MG tablet Take by mouth.  . testosterone cypionate (DEPOTESTOSTERONE CYPIONATE) 200 MG/ML injection Inject 200 mg into the muscle every 14 (fourteen) days.   No facility-administered encounter medications on file as of 09/05/2020.    Allergies (verified) Bee venom, Dye fdc red [red dye], Omeprazole, Atenolol, Benadryl [diphenhydramine hcl (sleep)], Enalapril, Gabapentin, and Hctz [hydrochlorothiazide]   History: Past Medical History:  Diagnosis Date  . Arthritis   . Depression   . Diabetes mellitus with complication ([place])   . Diabetic neuropathy ([place])   . Diastolic dysfunction    a. TTE 07/2017: EF 60-65%, mild concentric LVH, no RWMA, Gr1DD, trivial AI, mildly dilated LA, RVSF normal, PASP normal  . Edema    feet/legs  . GERD (gastroesophageal reflux disease)   . Gout   . History of stress test    a. MV 06/2017: small in size, mild in severity, apical anterior and apical defect that was minimally reversible and most likely represented artifact and less likely ischemia/scar, LVEF 55-65%, low risk, probably normal stress test  . Hypertension   . Hypothyroidism   . Kidney cysts    renal failure 2013  . Kidney stones    Dr. Rogers Blocker  . OSA (obstructive sleep apnea)    supplemental oxygen at night  . Oxygen dependent    hs  . [place] spotted fever 12/10/2017   Positve IgG in titer on 11/20/17  . S/P cardiac cath 1998   a. no obstructive disease  . Syncope and collapse   . Temporary low platelet count (HCC)    Past Surgical History:  Procedure Laterality Date  . ANAL FISSURE REPAIR    . BACK SURGERY  1977   rupture disc lumbar spine  . [facility].   Marland Kitchen CATARACT EXTRACTION W/PHACO Right 07/16/2016   Procedure: CATARACT EXTRACTION PHACO AND INTRAOCULAR LENS PLACEMENT (IOC);  Surgeon: [name], MD;  Location: [facility] ORS;  Service: Ophthalmology;  Laterality: Right;  [place]  01:11 AP% 17.6 CDE 24.83 fluid pack lot # [id number]  . CATARACT EXTRACTION W/PHACO Left 08/13/2016   Procedure: CATARACT EXTRACTION PHACO AND INTRAOCULAR LENS PLACEMENT ([place]) suture placed in left eye at end of procedure;  Surgeon: [name], MD;  Location: [facility] ORS;  Service: Ophthalmology;  Laterality: Left;  [place] 01:55 AP% 22.7 CDE 53.56 fluid pack lot # [id number]  . FINGER AMPUTATION     partial  . KNEE SURGERY  1993   arthroscopy  . SHOULDER SURGERY Bilateral 1998   arthroscopic right, rotator cuff repair left   Family History  Problem Relation Age of Onset  . Breast cancer Mother   . Lung cancer Mother   . Bone cancer Mother   . Heart Problems Mother   . Cancer Mother        breast,  lung and rib  . AAA (abdominal aortic aneurysm) Mother   . Heart disease Mother   . Heart attack Father   . Heart disease Father   . Cancer Brother        esophageal  . Heart attack Brother   . Colon cancer Neg Hx    Social History   Socioeconomic History  . Marital status: Married    Spouse name: [name]  . Number of children: 3  . Years of education: 70  . Highest education level: Not on file  Occupational History    Comment: retired  Tobacco Use  . Smoking status: Never Smoker  . Smokeless tobacco: Never Used  Vaping Use  . Vaping Use: Never used  Substance and Sexual Activity  . Alcohol use: No  . Drug use: No  . Sexual activity: Yes  Other Topics Concern  . Not on file  Social History Narrative   Lives in [place] with his wife ([name]). No children. Three step children. No pets.      Work - Patient is retired. Maintenance supervisor.      School - One year college education.      Right handed.      [place], served in [place], no combat               Social Determinants of Health   Financial Resource Strain: Medium Risk  . Difficulty of Paying Living Expenses: Somewhat hard  Food Insecurity: No Food Insecurity  . Worried About  Charity fundraiser in the Last Year: Never true  . Ran Out of Food in the Last Year: Never true  Transportation Needs: No Transportation Needs  . Lack of Transportation (Medical): No  . Lack of Transportation (Non-Medical): No  Physical Activity: Insufficiently Active  . Days of Exercise per Week: 3 days  . Minutes of Exercise per Session: 30 min  Stress: No Stress Concern Present  . Feeling of Stress : Not at all  Social Connections: Unknown  . Frequency of Communication with Friends and Family: Not on file  . Frequency of Social Gatherings with Friends and Family: Not on file  . Attends Religious Services: Not on file  . Active Member of Clubs or Organizations: Not on file  . Attends Archivist Meetings: Not on file  . Marital Status: Married    Tobacco Counseling Counseling given: Not Answered   Clinical Intake:  Pre-visit preparation completed: Yes        Diabetes: Yes (Followed by pcp)  How often do you need to have someone help you when you read instructions, pamphlets, or other written materials from your doctor or pharmacy?: 1 - Never  Nutrition Risk Assessment: Has the patient had any N/V/D within the last 2 weeks?  No  Does the patient have any non-healing wounds?  No  Has the patient had any unintentional weight loss or weight gain?  No   Diabetes: If diabetic, was a CBG obtained today?  No  Did the patient bring in their glucometer from home?  No  How often do you monitor your CBG's? Libre.   Financial Strains and Diabetes Management: Are you having any financial strains with the device, your supplies or your medication? No .  Does the patient want to be seen by Chronic Care Management for management of their diabetes?  No  Would the patient like to be referred to a Nutritionist or for Diabetic Management?  No  Interpreter Needed?:  No      Activities of Daily Living In your present state of health, do you have any difficulty performing the  following activities: 09/05/2020  Hearing? N  Vision? N  Difficulty concentrating or making decisions? N  Walking or climbing stairs? N  Comment Paces self  Dressing or bathing? N  Doing errands, shopping? N  Preparing Food and eating ? N  Using the Toilet? N  In the past six months, have you accidently leaked urine? N  Do you have problems with loss of bowel control? N  Managing your Medications? N  Managing your Finances? N  Comment Wife assist  Housekeeping or managing your Housekeeping? N  Some recent data might be hidden    Patient Care Team: [name], MD as PCP - General (Family Medicine) Wellington Hampshire, MD as PCP - Cardiology (Cardiology) Anthonette Legato, MD (Internal Medicine) Watt Climes, PA as Physician Assistant (Physician Assistant) De Hollingshead, RPH-CPP (Pharmacist)  Indicate any recent Medical Services you may have received from other than [facility] providers in the past year (date may be approximate).     Assessment:   This is a routine wellness examination for Clark.  I connected with Shonta today by telephone and verified that I am speaking with the correct person using two identifiers. Location patient: home Location provider: work Persons participating in the virtual visit: patient, Marine scientist.    I discussed the limitations, risks, security and privacy concerns of performing an evaluation and management service by telephone and the availability of in person appointments. The patient expressed understanding and verbally consented to this telephonic visit.    Interactive audio and video telecommunications were attempted between this provider and patient, however failed, due to patient having technical difficulties OR patient did not have access to video capability.  We continued and completed visit with audio only.  Some vital signs may be absent or patient reported.   Hearing/Vision screen  Hearing Screening   125Hz  250Hz  500Hz  1000Hz   2000Hz  3000Hz  4000Hz  6000Hz  8000Hz   Right ear:           Left ear:           Comments: Patient is able to hear conversational tones without difficulty.  No issues reported.  Vision Screening Comments: Wears corrective lenses Cataract extraction, bilateral Visual acuity not assessed, virtual visit.  They have seen their ophthalmologist in the last 12 months.     Dietary issues and exercise activities discussed: Current Exercise Habits: Home exercise routine  Regular diet Good water intake  Goals Addressed              This Visit's Progress     Patient Stated   .  HEMOGLOBIN A1C < 7 (pt-stated)        Kidney function at 60      Depression Screen PHQ 2/9 Scores 09/05/2020 03/02/2020 11/23/2019 09/05/2019 08/22/2019 04/20/2019 11/05/2018  PHQ - 2 Score 0 0 0 0 0 0 0    Fall Risk Fall Risk  09/05/2020 03/02/2020 11/23/2019 09/05/2019 08/22/2019  Falls in the past year? 0 0 1 0 0  Comment - - - - -  Number falls in past yr: 0 0 0 0 0  Injury with Fall? 0 - 0 - -  Comment - - - - -  Risk Factor Category  - - - - -  Risk for fall due to : - - - - -  Risk for fall due to: Comment - - - - -  Follow up Falls evaluation completed Falls evaluation completed Falls evaluation completed Falls evaluation completed Falls evaluation completed    FALL RISK PREVENTION PERTAINING TO THE HOME: Handrails in use when climbing stairs? Yes Home free of loose throw rugs in walkways, pet beds, electrical cords, etc? Yes  Adequate lighting in your home to reduce risk of falls? Yes   ASSISTIVE DEVICES UTILIZED TO PREVENT FALLS: Use of a cane, walker or w/c? Yes , as needed  TIMED UP AND GO: Was the test performed? No . Virtual visit.  Cognitive Function: Patient is alert and oriented x3.  Denies difficulty focusing,making decisions, memory loss.  MMSE/6CIT deferred. Normal by direct communication/observation.   MMSE - Mini Mental State Exam 07/03/2017 07/02/2016 07/03/2015  Orientation to time 5  5 5   Orientation to Place 5 5 5   Registration 3 3 3   Attention/ Calculation 5 5 5   Recall 3 3 3   Language- name 2 objects 2 2 2   Language- repeat 1 1 1   Language- follow 3 step command 3 3 3   Language- read & follow direction 1 1 1   Write a sentence 1 1 1   Copy design 1 1 1   Total score 30 30 30      6CIT Screen 09/05/2019 08/09/2018  What Year? 0 points 0 points  What month? 0 points 0 points  What time? - 0 points  Count back from 20 - 0 points  Months in reverse 0 points 0 points  Repeat phrase - 0 points  Total Score - 0    Immunizations Immunization History  Administered Date(s) Administered  . Fluad Quad(high Dose 65+) 04/21/2019, 03/02/2020  . Influenza, High Dose Seasonal PF 02/15/2016, 02/16/2017, 03/01/2018  . Influenza,inj,Quad PF,6+ Mos 01/04/2013, 02/20/2014, 02/27/2015  . Moderna Sars-Covid-2 Vaccination 10/10/2019, 11/07/2019  . Pneumococcal Conjugate-13 05/02/2013  . Pneumococcal Polysaccharide-23 01/05/2011  . Tdap 01/05/2008    TDAP status: Due, Education has been provided regarding the importance of this vaccine. Advised may receive this vaccine at local pharmacy or Health Dept. Aware to provide a copy of the vaccination record if obtained from local pharmacy or Health Dept. Verbalized acceptance and understanding. Deferred.  Health Maintenance Health Maintenance  Topic Date Due  . TETANUS/TDAP  09/05/2021 (Originally 01/04/2018)  . INFLUENZA VACCINE  11/12/2020  . HEMOGLOBIN A1C  12/03/2020  . OPHTHALMOLOGY EXAM  03/12/2021  . FOOT EXAM  06/05/2021  . Hepatitis C Screening  Completed  . PNA vac Low Risk Adult  Completed  . HPV VACCINES  Aged Out  . COVID-19 Vaccine  Discontinued   Colorectal cancer screening: No longer required.   Vision Screening: Recommended annual ophthalmology exams for early detection of glaucoma and other disorders of the eye. Is the patient up to date with their annual eye exam?  Yes  Who is the provider or what is the  name of the office in which the patient attends annual eye exams? [facility]  Dental Screening: Recommended annual dental exams for proper oral hygiene.  Community Resource Referral / Chronic Care Management: CRR required this visit?  No   CCM required this visit?  No      Plan:   Keep all routine maintenance appointments.   I have personally reviewed and noted the following in the patient's chart:   . Medical and social history . Use of alcohol, tobacco or illicit drugs  . Current medications and supplements including opioid prescriptions. Patient is currently taking opioid prescriptions. Information provided to patient regarding non-opioid alternatives.  Patient advised to discuss non-opioid treatment plan with their provider. Followed by pcp.  . Functional ability and status . Nutritional status . Physical activity . Advanced directives . List of other physicians . Hospitalizations, surgeries, and ER visits in previous 12 months . Vitals . Screenings to include cognitive, depression, and falls . Referrals and appointments  In addition, I have reviewed and discussed with patient certain preventive protocols, quality metrics, and best practice recommendations. A written personalized care plan for preventive services as well as general preventive health recommendations were provided to patient via mychart.     Varney Biles, LPN   [id number]

## 2020-09-05 NOTE — Telephone Encounter (Signed)
Refill sent to pharmacy.   

## 2020-09-05 NOTE — Patient Instructions (Addendum)
Douglas [name] , Thank you for taking time to come for your Medicare Wellness Visit. I appreciate your ongoing commitment to your health goals. Please review the following plan we discussed and let me know if I can assist you in the future.   These are the goals we discussed: Goals      Patient Stated   .  HEMOGLOBIN A1C < 7 (pt-stated)      Kidney function at 60    .  Medication Monitoring (pt-stated)      Patient Goals/Self-Care Activities . Over the next 90 days, patient will:  - take medications as prescribed check blood glucose three times daily using CGM, document, and provide at future appointments check blood pressure twice daily, document, and provide at future appointments          This is a list of the screening recommended for you and due dates:  Health Maintenance  Topic Date Due  . Tetanus Vaccine  09/05/2021*  . Flu Shot  11/12/2020  . Hemoglobin A1C  12/03/2020  . Eye exam for diabetics  03/12/2021  . Complete foot exam   06/05/2021  . Hepatitis C Screening: USPSTF Recommendation to screen - Ages 13-79 yo.  Completed  . Pneumonia vaccines  Completed  . HPV Vaccine  Aged Out  . COVID-19 Vaccine  Discontinued  *Topic was postponed. The date shown is not the original due date.   Advanced directives: End of life planning; Advance aging; Advanced directives discussed.  Copy of current HCPOA/Living Will requested.    Conditions/risks identified:none new  Follow up in one year for your annual wellness visit.   Preventive Care 26 Years and Older, Male Preventive care refers to lifestyle choices and visits with your health care provider that can promote health and wellness. What does preventive care include?  A yearly physical exam. This is also called an annual well check.  Dental exams once or twice a year.  Routine eye exams. Ask your health care provider how often you should have your eyes checked.  Personal lifestyle choices, including:  Daily care of  your teeth and gums.  Regular physical activity.  Eating a healthy diet.  Avoiding tobacco and drug use.  Limiting alcohol use.  Practicing safe sex.  Taking low doses of aspirin every day.  Taking vitamin and mineral supplements as recommended by your health care provider. What happens during an annual well check? The services and screenings done by your health care provider during your annual well check will depend on your age, overall health, lifestyle risk factors, and family history of disease. Counseling  Your health care provider may ask you questions about your:  Alcohol use.  Tobacco use.  Drug use.  Emotional well-being.  Home and relationship well-being.  Sexual activity.  Eating habits.  History of falls.  Memory and ability to understand (cognition).  Work and work Statistician. Screening  You may have the following tests or measurements:  Height, weight, and BMI.  Blood pressure.  Lipid and cholesterol levels. These may be checked every 5 years, or more frequently if you are over 28 years old.  Skin check.  Lung cancer screening. You may have this screening every year starting at age 72 if you have a 30-pack-year history of smoking and currently smoke or have quit within the past 15 years.  Fecal occult blood test (FOBT) of the stool. You may have this test every year starting at age 67.  Flexible sigmoidoscopy or colonoscopy. You may  have a sigmoidoscopy every 5 years or a colonoscopy every 10 years starting at age 3.  Prostate cancer screening. Recommendations will vary depending on your family history and other risks.  Hepatitis C blood test.  Hepatitis B blood test.  Sexually transmitted disease (STD) testing.  Diabetes screening. This is done by checking your blood sugar (glucose) after you have not eaten for a while (fasting). You may have this done every 1-3 years.  Abdominal aortic aneurysm (AAA) screening. You may need this if  you are a current or former smoker.  Osteoporosis. You may be screened starting at age 66 if you are at high risk. Talk with your health care provider about your test results, treatment options, and if necessary, the need for more tests. Vaccines  Your health care provider may recommend certain vaccines, such as:  Influenza vaccine. This is recommended every year.  Tetanus, diphtheria, and acellular pertussis (Tdap, Td) vaccine. You may need a Td booster every 10 years.  Zoster vaccine. You may need this after age 62.  Pneumococcal 13-valent conjugate (PCV13) vaccine. One dose is recommended after age 19.  Pneumococcal polysaccharide (PPSV23) vaccine. One dose is recommended after age 26. Talk to your health care provider about which screenings and vaccines you need and how often you need them. This information is not intended to replace advice given to you by your health care provider. Make sure you discuss any questions you have with your health care provider. Document Released: 04/27/2015 Document Revised: 12/19/2015 Document Reviewed: 01/30/2015 Elsevier Interactive Patient Education  2017 [place] Prevention in the Home Falls can cause injuries. They can happen to people of all ages. There are many things you can do to make your home safe and to help prevent falls. What can I do on the outside of my home?  Regularly fix the edges of walkways and driveways and fix any cracks.  Remove anything that might make you trip as you walk through a door, such as a raised step or threshold.  Trim any bushes or trees on the path to your home.  Use bright outdoor lighting.  Clear any walking paths of anything that might make someone trip, such as rocks or tools.  Regularly check to see if handrails are loose or broken. Make sure that both sides of any steps have handrails.  Any raised decks and porches should have guardrails on the edges.  Have any leaves, snow, or ice  cleared regularly.  Use sand or salt on walking paths during winter.  Clean up any spills in your garage right away. This includes oil or grease spills. What can I do in the bathroom?  Use night lights.  Install grab bars by the toilet and in the tub and shower. Do not use towel bars as grab bars.  Use non-skid mats or decals in the tub or shower.  If you need to sit down in the shower, use a plastic, non-slip stool.  Keep the floor dry. Clean up any water that spills on the floor as soon as it happens.  Remove soap buildup in the tub or shower regularly.  Attach bath mats securely with double-sided non-slip rug tape.  Do not have throw rugs and other things on the floor that can make you trip. What can I do in the bedroom?  Use night lights.  Make sure that you have a light by your bed that is easy to reach.  Do not use any sheets or blankets  that are too big for your bed. They should not hang down onto the floor.  Have a firm chair that has side arms. You can use this for support while you get dressed.  Do not have throw rugs and other things on the floor that can make you trip. What can I do in the kitchen?  Clean up any spills right away.  Avoid walking on wet floors.  Keep items that you use a lot in easy-to-reach places.  If you need to reach something above you, use a strong step stool that has a grab bar.  Keep electrical cords out of the way.  Do not use floor polish or wax that makes floors slippery. If you must use wax, use non-skid floor wax.  Do not have throw rugs and other things on the floor that can make you trip. What can I do with my stairs?  Do not leave any items on the stairs.  Make sure that there are handrails on both sides of the stairs and use them. Fix handrails that are broken or loose. Make sure that handrails are as long as the stairways.  Check any carpeting to make sure that it is firmly attached to the stairs. Fix any carpet that  is loose or worn.  Avoid having throw rugs at the top or bottom of the stairs. If you do have throw rugs, attach them to the floor with carpet tape.  Make sure that you have a light switch at the top of the stairs and the bottom of the stairs. If you do not have them, ask someone to add them for you. What else can I do to help prevent falls?  Wear shoes that:  Do not have high heels.  Have rubber bottoms.  Are comfortable and fit you well.  Are closed at the toe. Do not wear sandals.  If you use a stepladder:  Make sure that it is fully opened. Do not climb a closed stepladder.  Make sure that both sides of the stepladder are locked into place.  Ask someone to hold it for you, if possible.  Clearly mark and make sure that you can see:  Any grab bars or handrails.  First and last steps.  Where the edge of each step is.  Use tools that help you move around (mobility aids) if they are needed. These include:  Canes.  Walkers.  Scooters.  Crutches.  Turn on the lights when you go into a dark area. Replace any light bulbs as soon as they burn out.  Set up your furniture so you have a clear path. Avoid moving your furniture around.  If any of your floors are uneven, fix them.  If there are any pets around you, be aware of where they are.  Review your medicines with your doctor. Some medicines can make you feel dizzy. This can increase your chance of falling. Ask your doctor what other things that you can do to help prevent falls. This information is not intended to replace advice given to you by your health care provider. Make sure you discuss any questions you have with your health care provider. Document Released: 01/25/2009 Document Revised: 09/06/2015 Document Reviewed: 05/05/2014 Elsevier Interactive Patient Education  2017 [place]. Opioid Pain Medicine Management Opioid pain medicines are strong medicines that are used to treat bad or very bad pain. When  you take them for a short time, they can help you:  Sleep better.  Do better in  physical therapy.  Feel better during the first few days after you get hurt.  Recover from surgery. Only take these medicines if a doctor says that you can. You should only take them for a short time. This is because opioids can be hard to stop taking (they are addictive). The longer you take opioids, the harder it may be to stop taking them (opioid use disorder). What are the risks? Opioids can cause problems (side effects). Taking them for more than 3 days raises your chance of problems, such as:  Trouble pooping (constipation).  Feeling sick to your stomach (nausea).  Vomiting.  Feeling very sleepy.  Confusion.  Not being able to stop taking the medicine.  Breathing problems. Taking opioids for a long time can make it hard for you to do daily tasks. It can also put you at risk for:  Car accidents.  Depression.  Suicide.  Heart attack.  Taking too much of the medicine (overdose), which can sometimes lead to death. What is a pain treatment plan? A pain treatment plan is a plan made by you and your doctor. Work with your doctor to make a plan for treating your pain. To help you do this:  Talk about the goals of your treatment, including: ? How much pain you might expect to have. ? How you will manage the pain.  Talk about the risks and benefits of taking these medicines for your condition.  Remember that a good treatment plan uses more than one approach and lowers the risks of side effects.  Tell your doctor about the amount of medicines you take and about any drug or alcohol use.  Get your pain medicine prescriptions from only one doctor. Pain can be managed with other treatments. Work with your doctor to find other ways to help your pain, such as:  Physical therapy.  Counseling.  Eating healthy foods.  Brain exercises.  Massage.  Meditation.  Other pain  medicines.  Doing gentle exercises. Tapering your use of opioids If you have been taking opioids for more than a few weeks, you may need to slowly decrease (taper) how much you take until you stop taking them. Doing this can lower your chance of having symptoms, such as:  Pain and cramping in your belly (abdomen).  Feeling sick to your stomach.  Sweating.  Feeling very sleepy.  Feeling restless.  Shaking you cannot control (tremors).  Cravings for the medicine. Do not try to stop taking them by yourself. Work with your doctor to stop. Your doctor will help you take less until you are not taking the medicine at all. Follow these instructions at home: Safety and storage  While you are taking opioids: ? Do not drive. ? Do not use machines or power tools. ? Do not sign important papers (legal documents). ? Do not drink alcohol. ? Do not take sleeping pills. ? Do not take care of children by yourself. ? Do not do activities where you need to climb or be in high places, like working on a ladder. ? Do not go into any water, such as a lake, river, ocean, swimming pool, or hot tub.  Keep your opioids locked up or in a place where children cannot reach them.  Do not share your pain medicine with anyone.   Getting rid of leftover pills Do not save any leftover pills. Get rid of leftover pills safely by:  Taking them to a take-back program in your area.  Bringing them to a pharmacy  that has a container for throwing away pills (pill disposal).  Flushing them down the toilet. Check the label or package insert of your medicine to see whether this is safe to do.  Throwing them in the trash. Check the label or package insert of your medicine to see whether this is safe to do. If it is safe to throw them out: 1. Take the pills out of their container. 2. Put the pills into a container you can seal. 3. Mix the pills with used coffee grounds, food scraps, dirt, or cat litter. 4. Put this  in the trash. Activity  Return to your normal activities as told by your doctor. Ask your doctor what activities are safe for you.  Avoid doing things that make your pain worse.  Do exercises as told by your doctor. General instructions  You may need to take these actions to prevent or treat trouble pooping: ? Drink enough fluid to keep your pee (urine) pale yellow. ? Take over-the-counter or prescription medicines. ? Eat foods that are high in fiber. These include beans, whole grains, and fresh fruits and vegetables. ? Limit foods that are high in fat and sugar. These include fried or sweet foods.  Keep all follow-up visits as told by your doctor. This is important. Where to find support If you have been taking opioids for a long time, think about getting help quitting from a local support group or counselor. Ask your doctor about this. Where to find more information  Centers for Disease Control and Prevention (CDC): http://www.wolf.info/  U.[place]. Food and Drug Administration (FDA): [place] Get help right away if: Seek medical care right away if you are taking opioids and you, or people close to you, notice any of the following:  You have trouble breathing.  Your breathing is slower or more shallow than normal.  You have a very slow heartbeat.  You feel very confused.  You pass out (faint).  You are very sleepy.  Your speech is not normal.  You feel sick to your stomach and vomit.  You have cold skin.  You have blue lips or fingernails.  Your muscles are weak (limp) and your body seems floppy.  The black centers of your eyes (pupils) are smaller than normal. If you think that you or someone else may have taken too much of an opioid medicine, get medical help right away. Call your local emergency services (911 in the [place] Do not drive yourself to the hospital. If you ever feel like you may hurt yourself or others, or have thoughts about taking your own life, get help right  away. You can go to your nearest emergency department or call:  Your local emergency services (911 in the [place]  The hotline of the [facility] (754)307-2168 in the [place]  A suicide crisis helpline, such as the [place] at (515)411-0593. This is open 24 hours a day. Summary  Opioid are strong medicines that are used to treat bad or very bad pain.  A pain treatment plan is a plan made by you and your doctor. Work with your doctor to make a plan for treating your pain.  Work with your doctor to find other ways to help your pain.  If you think that you or someone else may have taken too much of an opioid, get help right away. This information is not intended to replace advice given to you by your health care provider. Make sure you discuss any  questions you have with your health care provider. Document Revised: 02/04/2019 Document Reviewed: 04/30/2018 Elsevier Patient Education  2021 [employer].

## 2020-09-05 NOTE — Addendum Note (Signed)
Addended by: [name] on: 09/05/2020 09:11 AM   Modules accepted: Orders

## 2020-09-06 ENCOUNTER — Telehealth (INDEPENDENT_AMBULATORY_CARE_PROVIDER_SITE_OTHER): Payer: Medicare Other | Admitting: Family Medicine

## 2020-09-06 ENCOUNTER — Other Ambulatory Visit: Payer: Self-pay

## 2020-09-06 DIAGNOSIS — E1165 Type 2 diabetes mellitus with hyperglycemia: Secondary | ICD-10-CM

## 2020-09-06 DIAGNOSIS — I10 Essential (primary) hypertension: Secondary | ICD-10-CM

## 2020-09-06 DIAGNOSIS — K219 Gastro-esophageal reflux disease without esophagitis: Secondary | ICD-10-CM

## 2020-09-06 DIAGNOSIS — E1142 Type 2 diabetes mellitus with diabetic polyneuropathy: Secondary | ICD-10-CM | POA: Diagnosis not present

## 2020-09-06 MED ORDER — METFORMIN HCL ER 500 MG PO TB24: 1000.0000 mg | ORAL_TABLET | Freq: Two times a day (BID) | ORAL | 3 refills | Status: DC

## 2020-09-06 NOTE — Assessment & Plan Note (Signed)
Discussed decreasing carbohydrate intake later in the day as that is likely driving his sugar up around 6 PM.  He will continue [place], metformin, and Ozempic.  He will come in for an A1c.

## 2020-09-06 NOTE — Assessment & Plan Note (Signed)
Generally has decent control.  He will continue Lasix, losartan, carvedilol, and doxazosin.  He will continue to monitor his blood pressure.

## 2020-09-06 NOTE — Assessment & Plan Note (Signed)
Well-controlled on prior version of Nexium.  He will try to get that as he had no symptoms while taking it.

## 2020-09-06 NOTE — Progress Notes (Signed)
Virtual Visit via video Note  This visit type was conducted due to national recommendations for restrictions regarding the COVID-19 pandemic (e.g. social distancing).  This format is felt to be most appropriate for this patient at this time.  All issues noted in this document were discussed and addressed.  No physical exam was performed (except for noted visual exam findings with Video Visits).   I connected with Douglas [name] today at 11:30 AM EDT by a video enabled telemedicine application or telephone and verified that I am speaking with the correct person using two identifiers. Location patient: home Location provider: work Persons participating in the virtual visit: patient, provider  I discussed the limitations, risks, security and privacy concerns of performing an evaluation and management service by telephone and the availability of in person appointments. I also discussed with the patient that there may be a patient responsible charge related to this service. The patient expressed understanding and agreed to proceed.   Reason for visit: f/u.  HPI: HYPERTENSION  Disease Monitoring  Home BP Monitoring generally less than 140/90 chest pain-no    dyspnea-no Medications  Compliance-taking Lasix 40 mg a day though has been out for 2 to 3 days, losartan 50 mg in the evening and 25 mg in the morning, carvedilol 6.25 mg twice daily, and doxazosin 2 mg daily.  Edema-he has had some increase in swelling since he has been out of his Lasix, he is going to pick this up later today  DIABETES Disease Monitoring: Blood Sugar ranges-average is 144, notes it is higher around 6 PM, checking sugar with freestyle libre multiple times per day polyuria/phagia/dipsia-no      Optho-up-to-date Medications: Compliance-taking tresiba 44 units once daily, metformin 1000 mg twice daily, and Ozempic 1 mg weekly hypoglycemic symptoms-no  GERD: Patient has been taking Nexium for quite some time.  He had to  change which version he got recently and had some increase in reflux.  He is going to go back to the prior version.  No abdominal pain.  No blood in stool.  No dysphagia.  Medication reconciliation was performed.  ROS: See pertinent positives and negatives per HPI.  Past Medical History:  Diagnosis Date  . Arthritis   . Depression   . Diabetes mellitus with complication ([place])   . Diabetic neuropathy ([place])   . Diastolic dysfunction    a. TTE 07/2017: EF 60-65%, mild concentric LVH, no RWMA, Gr1DD, trivial AI, mildly dilated LA, RVSF normal, PASP normal  . Edema    feet/legs  . GERD (gastroesophageal reflux disease)   . Gout   . History of stress test    a. MV 06/2017: small in size, mild in severity, apical anterior and apical defect that was minimally reversible and most likely represented artifact and less likely ischemia/scar, LVEF 55-65%, low risk, probably normal stress test  . Hypertension   . Hypothyroidism   . Kidney cysts    renal failure 2013  . Kidney stones    Dr. Rogers Blocker  . OSA (obstructive sleep apnea)    supplemental oxygen at night  . Oxygen dependent    hs  . [place] fever 12/10/2017   Positve IgG in titer on 11/20/17  . S/P cardiac cath 1998   a. no obstructive disease  . Syncope and collapse   . Temporary low platelet count (HCC)     Past Surgical History:  Procedure Laterality Date  . ANAL FISSURE REPAIR    . BACK SURGERY  1977   rupture disc lumbar spine  . [facility] with [facility].   Marland Kitchen CATARACT EXTRACTION W/PHACO Right 07/16/2016   Procedure: CATARACT EXTRACTION PHACO AND INTRAOCULAR LENS PLACEMENT (IOC);  Surgeon: [name], MD;  Location: [facility] ORS;  Service: Ophthalmology;  Laterality: Right;  [place] 01:11 AP% 17.6 CDE 24.83 fluid pack lot # [id number]  . CATARACT EXTRACTION W/PHACO Left 08/13/2016   Procedure: CATARACT EXTRACTION PHACO AND INTRAOCULAR LENS PLACEMENT ([place]) suture  placed in left eye at end of procedure;  Surgeon: [name], MD;  Location: [facility] ORS;  Service: Ophthalmology;  Laterality: Left;  [place] 01:55 AP% 22.7 CDE 53.56 fluid pack lot # [id number]  . FINGER AMPUTATION     partial  . KNEE SURGERY  1993   arthroscopy  . SHOULDER SURGERY Bilateral 1998   arthroscopic right, rotator cuff repair left    Family History  Problem Relation Age of Onset  . Breast cancer Mother   . Lung cancer Mother   . Bone cancer Mother   . Heart Problems Mother   . Cancer Mother        breast, lung and rib  . AAA (abdominal aortic aneurysm) Mother   . Heart disease Mother   . Heart attack Father   . Heart disease Father   . Cancer Brother        esophageal  . Heart attack Brother   . Colon cancer Neg Hx     SOCIAL HX: Non-smoker   Current Outpatient Medications:  .  carvedilol (COREG) 6.25 MG tablet, Take 6.25 mg by mouth 2 (two) times daily with a meal., Disp: , Rfl:  .  losartan (COZAAR) 25 MG tablet, Take 25 mg by mouth. Takes 50 mg pm and 25 mg am, Disp: , Rfl:  .  acetaminophen (TYLENOL) 500 MG tablet, Take 500 mg by mouth every 6 (six) hours as needed., Disp: , Rfl:  .  albuterol (VENTOLIN HFA) 108 (90 Base) MCG/ACT inhaler, INHALE 2 PUFFS BY ORAL INHALATION AS NEEDED, Disp: , Rfl:  .  allopurinol (ZYLOPRIM) 300 MG tablet, TAKE 1 TABLET(300 MG) BY MOUTH DAILY, Disp: 90 tablet, Rfl: 1 .  aspirin 81 MG tablet, Take 1 tablet (81 mg total) by mouth daily., Disp: , Rfl:  .  BD PEN NEEDLE NANO 2ND GEN 32G X 4 MM MISC, USE DAILY, Disp: 100 each, Rfl: 1 .  calcitRIOL (ROCALTROL) 0.25 MCG capsule, Take 0.25 mcg by mouth daily. , Disp: , Rfl: 5 .  clove oil liquid, Apply 1 application topically as needed., Disp: , Rfl:  .  cyclobenzaprine (FLEXERIL) 10 MG tablet, Take 1 tablet by mouth as needed., Disp: , Rfl:  .  DULoxetine (CYMBALTA) 60 MG capsule, TAKE 1 CAPSULE(60 MG) BY MOUTH DAILY, Disp: 90 capsule, Rfl: 1 .  esomeprazole (NEXIUM) 40 MG capsule,  Take 1 capsule (40 mg total) by mouth daily at 12 noon., Disp: 90 capsule, Rfl: 3 .  finasteride (PROSCAR) 5 MG tablet, TAKE 1 TABLET(5 MG) BY MOUTH AT BEDTIME, Disp: 90 tablet, Rfl: 3 .  fluticasone (FLONASE) 50 MCG/ACT nasal spray, Place 2 sprays into both nostrils daily., Disp: 16 g, Rfl: 6 .  furosemide (LASIX) 40 MG tablet, Take 1 tablet (40 mg total) by mouth daily., Disp: 90 tablet, Rfl: 1 .  HYDROcodone-acetaminophen (NORCO/VICODIN) 5-325 MG tablet, Take 1 tablet by mouth every 6 (six) hours as needed for moderate pain., Disp: 30 tablet, Rfl: 0 .  insulin degludec ([name] FLEXTOUCH) 200 UNIT/ML FlexTouch Pen, Inject 44 Units into the skin daily., Disp: 15 mL, Rfl: 0 .  levothyroxine (SYNTHROID) 100 MCG tablet, TAKE 1 TABLET(100 MCG) BY MOUTH DAILY BEFORE BREAKFAST, Disp: 90 tablet, Rfl: 3 .  loratadine (CLARITIN) 10 MG tablet, Take 10 mg by mouth daily as needed for allergies., Disp: , Rfl:  .  Magnesium 250 MG TABS, Take 1 tablet by mouth daily., Disp: , Rfl:  .  metFORMIN (GLUCOPHAGE-XR) 500 MG 24 hr tablet, Take 2 tablets (1,000 mg total) by mouth 2 (two) times daily., Disp: 360 tablet, Rfl: 3 .  pregabalin (LYRICA) 100 MG capsule, TAKE 2 CAPSULES BY MOUTH TWICE DAILY, Disp: 360 capsule, Rfl: 1 .  Semaglutide, 1 MG/DOSE, (OZEMPIC, 1 MG/DOSE,) 4 MG/3ML SOPN, Inject 1 mg into the skin once a week., Disp: 3 mL, Rfl: 3 .  simvastatin (ZOCOR) 40 MG tablet, TAKE 1 TABLET(40 MG) BY MOUTH AT BEDTIME, Disp: 90 tablet, Rfl: 3 .  tadalafil (CIALIS) 20 MG tablet, Take by mouth., Disp: , Rfl:  .  testosterone cypionate (DEPOTESTOSTERONE CYPIONATE) 200 MG/ML injection, Inject 200 mg into the muscle every 14 (fourteen) days., Disp: , Rfl:   EXAM:  VITALS per patient if applicable:  GENERAL: alert, oriented, appears well and in no acute distress  HEENT: atraumatic, conjunttiva clear, no obvious abnormalities on inspection of external nose and ears  NECK: normal movements of the head and  neck  LUNGS: on inspection no signs of respiratory distress, breathing rate appears normal, no obvious gross SOB, gasping or wheezing  CV: no obvious cyanosis  MS: moves all visible extremities without noticeable abnormality  PSYCH/NEURO: pleasant and cooperative, no obvious depression or anxiety, speech and thought processing grossly intact  ASSESSMENT AND PLAN:  Discussed the following assessment and plan:  Problem List Items Addressed This Visit    DM type 2 with diabetic peripheral neuropathy ([place])    Discussed decreasing carbohydrate intake later in the day as that is likely driving his sugar up around 6 PM.  He will continue [place], metformin, and Ozempic.  He will come in for an A1c.      Relevant Medications   losartan (COZAAR) 25 MG tablet   metFORMIN (GLUCOPHAGE-XR) 500 MG 24 hr tablet   Other Relevant Orders   POCT HgB A1C   Essential hypertension    Generally has decent control.  He will continue Lasix, losartan, carvedilol, and doxazosin.  He will continue to monitor his blood pressure.      Relevant Medications   losartan (COZAAR) 25 MG tablet   carvedilol (COREG) 6.25 MG tablet   Esophageal reflux    Well-controlled on prior version of Nexium.  He will try to get that as he had no symptoms while taking it.       Other Visit Diagnoses    Uncontrolled type 2 diabetes mellitus with hyperglycemia (HCC)       Relevant Medications   losartan (COZAAR) 25 MG tablet   metFORMIN (GLUCOPHAGE-XR) 500 MG 24 hr tablet      Return in about 1 week (around 09/13/2020) for a1c with nurse visit, 4 month pcp.   I discussed the assessment and treatment plan with the patient. The patient was provided an opportunity to ask questions and all were answered. The patient agreed with the plan and demonstrated an understanding of the instructions.   The patient was advised to call back or seek an in-person evaluation if the symptoms worsen or if the  condition fails to improve as  anticipated.   [name], MD

## 2020-09-07 ENCOUNTER — Ambulatory Visit: Payer: Medicare Other | Admitting: Pharmacist

## 2020-09-07 DIAGNOSIS — E785 Hyperlipidemia, unspecified: Secondary | ICD-10-CM

## 2020-09-07 DIAGNOSIS — E1142 Type 2 diabetes mellitus with diabetic polyneuropathy: Secondary | ICD-10-CM

## 2020-09-07 DIAGNOSIS — I10 Essential (primary) hypertension: Secondary | ICD-10-CM

## 2020-09-07 DIAGNOSIS — N1831 Chronic kidney disease, stage 3a: Secondary | ICD-10-CM

## 2020-09-07 NOTE — Patient Instructions (Signed)
Visit Information  PATIENT GOALS: Goals Addressed              This Visit's Progress     Patient Stated   .  Medication Monitoring (pt-stated)        Patient Goals/Self-Care Activities . Over the next 90 days, patient will:  - take medications as prescribed check blood glucose three times daily using CGM, document, and provide at future appointments check blood pressure twice daily, document, and provide at future appointments         Patient verbalizes understanding of instructions provided today and agrees to view in [place].   Plan: Telephone follow up appointment with care management team member scheduled for:  ~ 4 weeks as previously scheduled  Catie Darnelle Maffucci, PharmD, [place], [place] Clinical Pharmacist [employer] [facility] [employer] [phone]

## 2020-09-07 NOTE — Chronic Care Management (AMB) (Signed)
Chronic Care Management Pharmacy Note  09/07/2020 Name:  ALBIE ARIZPE MRN:  [id number] DOB:  10/24/1945  Subjective: [name] Heslin is an 75 y.o. year old male who is a primary patient of [name], [name], MD.  The CCM team was consulted for assistance with disease management and care coordination needs.    Care coordination  for device access in response to provider referral for pharmacy case management and/or care coordination services.   Consent to Services:  The patient was given information about Chronic Care Management services, agreed to services, and gave verbal consent prior to initiation of services.  Please see initial visit note for detailed documentation.   Patient Care Team: [name], MD as PCP - General (Family Medicine) Wellington Hampshire, MD as PCP - Cardiology (Cardiology) Anthonette Legato, MD (Internal Medicine) Watt Climes, PA as Physician Assistant (Physician Assistant) De Hollingshead, RPH-CPP (Pharmacist)  Recent office visits: 5/26 - PCP visit. Continue current regimen  Recent consult visits: None recently  Hospital visits: None in previous 6 months  Objective:  Lab Results  Component Value Date   CREATININE 1.51 (H) 06/05/2020   CREATININE 1.45 06/10/2019   CREATININE 1.49 (H) 01/19/2019    Lab Results  Component Value Date   HGBA1C 7.6 (H) 06/05/2020   Last diabetic Eye exam:  Lab Results  Component Value Date/Time   HMDIABEYEEXA No Retinopathy 03/12/2020 12:00 AM    Last diabetic Foot exam:  Lab Results  Component Value Date/Time   HMDIABFOOTEX Normal 11/24/2014 12:00 AM        Component Value Date/Time   CHOL 115 11/23/2019 1350   CHOL 142 11/12/2018 1142   TRIG 174.0 (H) 11/23/2019 1350   HDL 40.80 11/23/2019 1350   HDL 49 11/12/2018 1142   CHOLHDL 3 11/23/2019 1350   VLDL 34.8 11/23/2019 1350   LDLCALC 40 11/23/2019 1350   LDLCALC 58 11/12/2018 1142    Hepatic Function Latest Ref Rng & Units  11/23/2019 11/12/2018 08/19/2017  Total Protein 6.0 - 8.3 g/dL 6.5 6.3 6.6  Albumin 3.5 - 5.2 g/dL 4.1 4.3 3.8  AST 0 - 37 U/L 26 17 20   ALT 0 - 53 U/L 24 23 20   Alk Phosphatase 39 - 117 U/L 69 75 59  Total Bilirubin 0.2 - 1.2 mg/dL 0.7 0.6 0.6  Bilirubin, Direct 0.0 - 0.3 mg/dL 0.2 - -    Lab Results  Component Value Date/Time   TSH 1.72 03/02/2020 10:36 AM   TSH 3.72 04/21/2019 10:18 AM    CBC Latest Ref Rng & Units 01/19/2019 09/10/2017 02/18/2017  WBC 4.0 - 10.5 K/uL 10.6(H) 9.5 7.5  Hemoglobin 13.0 - 17.0 g/dL 11.8(L) 11.7(L) 13.0  Hematocrit 39.0 - 52.0 % 36.6(L) 36.0(L) 40.3  Platelets 150 - 400 K/uL 258 210 225.0    Lab Results  Component Value Date/Time   VD25OH 83 05/02/2013 01:18 PM    Clinical ASCVD: Yes    Social History   Tobacco Use  Smoking Status Never Smoker  Smokeless Tobacco Never Used   BP Readings from Last 3 Encounters:  09/06/20 (!) 159/94  06/05/20 138/84  05/03/20 (!) 171/81   Pulse Readings from Last 3 Encounters:  09/06/20 80  06/05/20 71  05/03/20 84   Wt Readings from Last 3 Encounters:  09/06/20 257 lb (116.6 kg)  09/05/20 268 lb (121.6 kg)  06/05/20 268 lb 4 oz (121.7 kg)    Assessment: Review of patient past medical history, allergies,  medications, health status, including review of consultants reports, laboratory and other test data, was performed as part of comprehensive evaluation and provision of chronic care management services.   SDOH:  (Social Determinants of Health) assessments and interventions performed:    CCM Care Plan  Allergies  Allergen Reactions  . Bee Venom Swelling  . Dye Fdc Red [Red Dye] Swelling and Other (See Comments)    Reaction: gout  . Omeprazole     Other reaction(s): increased sx on high dose  . Atenolol Other (See Comments)    Did not regulate blood pressure  . Benadryl [Diphenhydramine Hcl (Sleep)] Other (See Comments)    Hyperactivity   . Enalapril Itching    itching  . Gabapentin  Palpitations and Rash    rash  . Hctz [Hydrochlorothiazide] Other (See Comments)    Decreased potassium    Medications Reviewed Today    Reviewed by Babs Bertin, CMA (Certified Medical Assistant) on 09/06/20 at 1107  Med List Status: <None>  Medication Order Taking? Sig Documenting Provider Last Dose Status Informant  acetaminophen (TYLENOL) 500 MG tablet [id number]  Take 500 mg by mouth every 6 (six) hours as needed. [provider]  Active   albuterol (VENTOLIN HFA) 108 (90 Base) MCG/ACT inhaler [id number]  INHALE 2 PUFFS BY ORAL INHALATION AS NEEDED [provider]  Active   allopurinol (ZYLOPRIM) 300 MG tablet [id number]  TAKE 1 TABLET(300 MG) BY MOUTH DAILY [name], MD  Active   amLODipine (NORVASC) 10 MG tablet [id number]  TAKE ONE-HALF TABLET BY MOUTH ONCE EVERY DAY [provider]  Active   aspirin 81 MG tablet [id number]  Take 1 tablet (81 mg total) by mouth daily. Wellington Hampshire, MD  Active Self  BD PEN NEEDLE NANO 2ND GEN 32G X 4 MM MISC [id number]  USE DAILY [name], MD  Active   calcitRIOL (ROCALTROL) 0.25 MCG capsule [id number]  Take 0.25 mcg by mouth daily.  [provider]  Active Self  clove oil liquid [id number]  Apply 1 application topically as needed. [provider]  Active   cyclobenzaprine (FLEXERIL) 10 MG tablet [id number]  Take 1 tablet by mouth as needed. [provider]  Active   dapagliflozin propanediol ([name]) 10 MG TABS tablet [id number]  Take 1 tablet (10 mg total) by mouth daily before breakfast.  Patient not taking: No sig reported   [name], MD  Active   DULoxetine (CYMBALTA) 60 MG capsule [id number]  TAKE 1 CAPSULE(60 MG) BY MOUTH DAILY [name], MD  Active   esomeprazole (NEXIUM) 40 MG capsule [id number]  Take 1 capsule (40 mg total) by mouth daily at 12 noon. Jackolyn Confer, MD  Active   finasteride (PROSCAR) 5 MG tablet [id number]  TAKE 1 TABLET(5 MG) BY MOUTH  AT BEDTIME Stoioff, Ronda Fairly, MD  Active   fluticasone (FLONASE) 50 MCG/ACT nasal spray [id number]  Place 2 sprays into both nostrils daily. [name], MD  Active   furosemide (LASIX) 40 MG tablet [id number]  Take 1 tablet (40 mg total) by mouth daily. [name], MD  Active   HYDROcodone-acetaminophen (NORCO/VICODIN) 5-325 MG tablet [id number]  Take 1 tablet by mouth every 6 (six) hours as needed for moderate pain. [name], MD  Active   insulin degludec ([name] FLEXTOUCH) 200 UNIT/ML FlexTouch Pen [id number]  Inject 44 Units into the skin daily. [name], MD  Active   levothyroxine (SYNTHROID) 100 MCG tablet  [id number]  TAKE 1 TABLET(100 MCG) BY MOUTH DAILY BEFORE BREAKFAST [name], MD  Active   loratadine (CLARITIN) 10 MG tablet [id number]  Take 10 mg by mouth daily as needed for allergies. [provider]  Active   Magnesium 250 MG TABS [id number]  Take 1 tablet by mouth daily. [provider]  Active   metFORMIN (GLUCOPHAGE-XR) 500 MG 24 hr tablet [id number]  Take 2 tablets (1,000 mg total) by mouth 2 (two) times daily. TAKE 1 TABLET BY MOUTH IN THE MORNING AND 2 TABLETS IN THE EVENING [name], MD  Active   minoxidil (LONITEN) 10 MG tablet [id number]  Take 1 tablet by mouth daily. [provider]  Active   montelukast (SINGULAIR) 10 MG tablet [id number]  Take 1 tablet by mouth daily. [provider]  Active   nebivolol (BYSTOLIC) 10 MG tablet [id number]  TAKE TWO TABLETS BY MOUTH ONCE EVERY DAY  Patient not taking: Reported on 08/10/2020   [provider]  Active   piroxicam (FELDENE) 20 MG capsule [id number]  TAKE 1 CAPSULE BY MOUTH ONCE EVERY DAY [provider]  Active   pregabalin (LYRICA) 100 MG capsule [id number]  TAKE 2 CAPSULES BY MOUTH TWICE DAILY [name], MD  Active   Semaglutide, 1 MG/DOSE, (OZEMPIC, 1 MG/DOSE,) 4 MG/3ML SOPN [id number]  Inject 1 mg into the skin once a week.  [name], MD  Active   simvastatin (ZOCOR) 40 MG tablet [id number]  TAKE 1 TABLET(40 MG) BY MOUTH AT BEDTIME [name], MD  Active   tadalafil (CIALIS) 20 MG tablet [id number]  Take by mouth. [provider]  Active   testosterone cypionate (DEPOTESTOSTERONE CYPIONATE) 200 MG/ML injection [id number]  Inject 200 mg into the muscle every 14 (fourteen) days. [provider]  Active           Patient Active Problem List   Diagnosis Date Noted  . Abnormal results of liver function studies 08/10/2020  . Esophageal reflux 08/10/2020  . Low back pain 08/10/2020  . Other testicular hypofunction 08/10/2020  . Restless legs syndrome 08/10/2020  . Skin exam, screening for cancer 03/02/2020  . Anemia in chronic kidney disease 02/18/2019  . Secondary hyperparathyroidism of renal origin ([place]) 02/18/2019  . Right kidney mass 01/20/2019  . Erectile dysfunction due to arterial insufficiency 11/09/2018  . Respiratory illness 11/05/2018  . Tick bite of knee, initial encounter 08/17/2018  . Left shoulder pain 07/02/2018  . Allergic rhinitis 07/02/2018  . Cervical radiculopathy 11/20/2017  . Toe pain, bilateral 11/20/2017  . Abdominal pain 02/18/2017  . Clavicle enlargement 02/18/2017  . Anxiety 02/18/2017  . Degenerative arthritis of knee, bilateral 09/04/2016  . BPH (benign prostatic hyperplasia) 08/18/2016  . Knee osteoarthritis 08/18/2016  . Hyperlipidemia 05/20/2016  . Essential hypertension 05/31/2015  . Low testosterone 02/27/2015  . Diabetic neuropathy ([place]) 01/05/2013  . Obesity (BMI 30-39.9) 01/05/2013  . Chronic back pain greater than 3 months duration 01/05/2013  . Gout 01/05/2013  . OSA (obstructive sleep apnea) 01/05/2013  . Chronic kidney disease 01/04/2013  . DM type 2 with diabetic peripheral neuropathy ([place]) 01/04/2013  . Hypothyroidism 01/04/2013    Immunization History  Administered Date(s) Administered  . Fluad Quad(high Dose 65+)  04/21/2019, 03/02/2020  . Influenza, High Dose Seasonal PF 02/15/2016, 02/16/2017, 03/01/2018  . Influenza,inj,Quad PF,6+ Mos 01/04/2013, 02/20/2014, 02/27/2015  . Moderna Sars-Covid-2 Vaccination 10/10/2019, 11/07/2019  . Pneumococcal Conjugate-13 05/02/2013  . Pneumococcal Polysaccharide-23 01/05/2011  . Tdap 01/05/2008  Conditions to be addressed/monitored: HTN and DMII  Care Plan : Medication Management  Updates made by De Hollingshead, RPH-CPP since 09/07/2020 12:00 AM    Problem: Diabetes, Hypertension     Long-Range Goal: Disease Progression Prevention   Start Date: 01/11/2020  Recent Progress: On track  Priority: High  Note:   Current Barriers:  . Unable to independently afford treatment regimen . Unable to achieve control of diabetes   Pharmacist Clinical Goal(s):  Marland Kitchen Over the next 90 days, patient will verbalize ability to afford treatment regimen. . Over the next 90 days, patient will achieve control of diabetes as evidenced by improvement in A1c through collaboration with PharmD and provider.   Interventions: . 1:1 collaboration with [name], MD regarding development and update of comprehensive plan of care as evidenced by provider attestation and co-signature . Inter-disciplinary care team collaboration (see longitudinal plan of care) . Comprehensive medication review performed; medication list updated in electronic medical record   Diabetes: . Uncontrolled; current treatment: Ozempic 1 mg weekly, metformin XR 1000 mg BID, [name] 44 units daily;  o Hx [name] - discontinued due to side effects . Moving forward - if eGFR remains >45, will continue metformin 1000 mg BID. If renal function drops to <45, recommend reducing to 500 mg BID.  Marland Kitchen Receiving patient assistance for Lee, Tyler Aas, . Contacted [facility], as we had received multiple faxes for an updated DWO. They note they received this and will process refill when due.    Hypertension: . Controlled per home readings; current treatment: losartan 25 mg QAM, 50 mg QPM, doxazosin 2 mg QPM, carvedilol 6.25 mg BID; furosemide 40 mg QAM o Notes dizziness w/ higher dose of doxazosin o Afternoon dizziness/sleepiness w/ losartan 50 mg dose in the morning o Hx hypokalemia w/ HCTZ o Itching w/ enalapril o Reported lack of benefit w/ generic atenolol o No hx spironolactone.  Marland Kitchen PCP recommended to continue regimen at this time.Continue to follow home readings.    Hyperlipidemia: . Controlled per last lab work; current treatment: simvastatin 40 mg daily . Previously recommended to continue current regimen at this time  Peripheral Neuropathy, Chronic Back Pain: . Controlled at this time current treatment: duloxetine 60 mg daily, pregabalin 200 mg BID, hydocodone-APAP 5/325 mg PRN (reports that a prescription for 30 tablets will last about a year), topical clove oil and Salonpas; PT for mobility support; cyclobenzaprine 10 mg PRN  . Previously recommended to continue current regimen.   CKD: . Stable. Current treatment: calcitriol 0.25 mg daily. Follows w/ Dr. Holley Raring o Pt did not tolerate Wilder Glade . Previously recommended to continue current regimen and collaboration w/ nephrology at this time  Gout: . Controlled, patient has not had a gout flare since initial diagnosis; current treatment: allopurinol 300 mg daily . Recommended to continue current regimen at this time  Patient Goals/Self-Care Activities . Over the next 90 days, patient will:  - take medications as prescribed check glucose three times daily using CGM, document, and provide at future appointments check blood pressure daily, document, and provide at future appointments collaborate with provider on medication access solutions   Follow Up Plan: Telephone follow up appointment with care management team member scheduled for: ~ 4 weeks as previously scheduled      Medication Assistance: Ozempic,  [name],obtained through[employer] medication assistance program. Enrollment ends 03/13/21  Patient's preferred pharmacy is:  [place], [place].  [place] [address] Phone: [phone] Fax: [phone]    Follow Up:  Patient agrees to Care Plan and Follow-up.  Plan: Telephone follow up appointment with care management team member scheduled for:  ~ 4 weeks as previously scheduled  Catie Darnelle Maffucci, PharmD, [place], [place] Clinical Pharmacist [employer] at [employer] [phone]

## 2020-10-02 ENCOUNTER — Telehealth: Payer: Self-pay

## 2020-10-02 NOTE — Telephone Encounter (Signed)
His wife are out of town, I left a message with his granddaughter that his medication is ready for pickup when they return and she understood.  Garren Greenman,cma

## 2020-10-03 ENCOUNTER — Telehealth: Payer: Medicare Other

## 2020-10-03 ENCOUNTER — Telehealth: Payer: Self-pay | Admitting: Pharmacist

## 2020-10-03 NOTE — Telephone Encounter (Signed)
  Chronic Care Management   Note  10/03/2020 Name: MISHA ANTONINI MRN: [id number] DOB: 07/15/1945   Attempted to contact patient for scheduled appointment for medication management support. His granddaughter answered the phone and noted that patient and his wife were in [place] on vacation and would return end of next week.   Will collaborate with Care Guide to outreach for rescheduling.   Patient needs lab appointment per PCP.  Catie Darnelle Maffucci, PharmD, [place], [place] Clinical Pharmacist [employer] at [place]

## 2020-10-10 ENCOUNTER — Telehealth: Payer: Self-pay

## 2020-10-10 NOTE — Chronic Care Management (AMB) (Signed)
  Care Management   Note  10/10/2020 Name: Douglas [name] MRN: [id number] DOB: 1946/03/27  [name] Date is a 75 y.o. year old male who is a primary care patient of [name], MD and is actively engaged with the care management team. I reached out to [name] by phone today to assist with re-scheduling a follow up visit with the Pharmacist  Follow up plan: Unsuccessful telephone outreach attempt made.  The care management team will reach out to the patient again over the next 5 days.  If patient returns call to provider office, please advise to call [facility]  at [place], [place], [place], [place] [address] Direct Dial: [phone] [email or web address] Website: [email or web address]

## 2020-10-12 ENCOUNTER — Other Ambulatory Visit: Payer: Self-pay | Admitting: Family Medicine

## 2020-10-17 NOTE — Chronic Care Management (AMB) (Signed)
  Care Management   Note  10/17/2020 Name: Douglas [name] MRN: [id number] DOB: 10-11-1945  Douglas [name] is a 75 y.o. year old male who is a primary care patient of [name], MD and is actively engaged with the care management team. I reached out to [name] by phone today to assist with re-scheduling a follow up visit with the Pharmacist  Follow up plan: Unsuccessful telephone outreach attempt made. A HIPAA compliant phone message was left for the patient providing contact information and requesting a return call.  The care management team will reach out to the patient again over the next 7 days.  If patient returns call to provider office, please advise to call [facility]  at [place], [place], [place], [place] [address] Direct Dial: [phone] [name] Website: [email or web address]

## 2020-10-18 NOTE — Telephone Encounter (Signed)
I called the patient and reminded him to pick up his medication before 5 pm and he stated he would come today.  Douglas [name],cma

## 2020-10-23 NOTE — Telephone Encounter (Signed)
Patient was in [place] finally reached him

## 2020-11-01 DIAGNOSIS — N2581 Secondary hyperparathyroidism of renal origin: Secondary | ICD-10-CM | POA: Diagnosis not present

## 2020-11-01 DIAGNOSIS — I10 Essential (primary) hypertension: Secondary | ICD-10-CM | POA: Diagnosis not present

## 2020-11-01 DIAGNOSIS — E1122 Type 2 diabetes mellitus with diabetic chronic kidney disease: Secondary | ICD-10-CM | POA: Diagnosis not present

## 2020-11-01 DIAGNOSIS — R600 Localized edema: Secondary | ICD-10-CM | POA: Diagnosis not present

## 2020-11-01 DIAGNOSIS — D631 Anemia in chronic kidney disease: Secondary | ICD-10-CM | POA: Diagnosis not present

## 2020-11-01 DIAGNOSIS — N1831 Chronic kidney disease, stage 3a: Secondary | ICD-10-CM | POA: Diagnosis not present

## 2020-11-09 ENCOUNTER — Ambulatory Visit (INDEPENDENT_AMBULATORY_CARE_PROVIDER_SITE_OTHER): Payer: Medicare Other | Admitting: Podiatry

## 2020-11-09 ENCOUNTER — Other Ambulatory Visit: Payer: Self-pay

## 2020-11-09 DIAGNOSIS — M79675 Pain in left toe(s): Secondary | ICD-10-CM | POA: Diagnosis not present

## 2020-11-09 DIAGNOSIS — M7752 Other enthesopathy of left foot: Secondary | ICD-10-CM | POA: Diagnosis not present

## 2020-11-09 DIAGNOSIS — B351 Tinea unguium: Secondary | ICD-10-CM | POA: Diagnosis not present

## 2020-11-09 DIAGNOSIS — M79674 Pain in right toe(s): Secondary | ICD-10-CM

## 2020-11-09 DIAGNOSIS — L989 Disorder of the skin and subcutaneous tissue, unspecified: Secondary | ICD-10-CM

## 2020-11-09 MED ORDER — MELOXICAM 15 MG PO TABS: 15.0000 mg | ORAL_TABLET | Freq: Every day | ORAL | 1 refills | Status: DC

## 2020-11-09 MED ORDER — BETAMETHASONE SOD PHOS & ACET 6 (3-3) MG/ML IJ SUSP: 3.0000 mg | Freq: Once | INTRAMUSCULAR | Status: DC

## 2020-11-09 NOTE — Progress Notes (Signed)
SUBJECTIVE Patient presents to office today complaining of elongated, thickened nails that cause pain while ambulating in shoes.  He is unable to trim his own nails.  He is also been complaining of pain to the medial border of the bilateral great toenails.  He believes the rescanning callus buildup in that area.  Patient is here for further evaluation and treatment.   Patient also has a new complaint today regarding some pain and sensitivity to the left ankle joint is been going on for a few weeks now.  He denies a history of injury.  He states that it is symptomatic and painful with his shoes.  He presents for further treatment evaluation.  Currently he has not done anything to treat this  Past Medical History:  Diagnosis Date   Arthritis    Depression    Diabetes mellitus with complication ([place])    Diabetic neuropathy ([place])    Diastolic dysfunction    a. TTE 07/2017: EF 60-65%, mild concentric LVH, no RWMA, Gr1DD, trivial AI, mildly dilated LA, RVSF normal, PASP normal   Edema    feet/legs   GERD (gastroesophageal reflux disease)    Gout    History of stress test    a. MV 06/2017: small in size, mild in severity, apical anterior and apical defect that was minimally reversible and most likely represented artifact and less likely ischemia/scar, LVEF 55-65%, low risk, probably normal stress test   Hypertension    Hypothyroidism    Kidney cysts    renal failure 2013   Kidney stones    Dr. Rogers Blocker   OSA (obstructive sleep apnea)    supplemental oxygen at night   Oxygen dependent    hs   [facility] spotted fever 12/10/2017   Positve IgG in titer on 11/20/17   S/P cardiac cath 1998   a. no obstructive disease   Syncope and collapse    Temporary low platelet count ([place])     OBJECTIVE General Patient is awake, alert, and oriented x 3 and in no acute distress. Derm Skin is dry and supple bilateral. Negative open lesions or macerations. Remaining integument unremarkable. Nails are  tender, long, thickened and dystrophic with subungual debris, consistent with onychomycosis, 1-5 bilateral. No signs of infection noted.  Hyperkeratotic preulcerative callus tissue noted to the bilateral toes Vasc  DP and PT pedal pulses palpable bilaterally. Temperature gradient within normal limits.  Neuro Epicritic and protective threshold sensation grossly intact bilaterally.  Musculoskeletal Exam No symptomatic pedal deformities noted bilateral. Muscular strength within normal limits.  Today there is pain on palpation to the anterior aspect of the left ankle joint.  Pain with range of motion as well.  ASSESSMENT 1. Onychodystrophic nails 1-5 bilateral with hyperkeratosis of nails.  2. Onychomycosis of nail due to dermatophyte bilateral 3. Pain in foot bilateral 4.  Hyperkeratotic painful callus tissue bilateral great toes 5.  Capsulitis left ankle joint  PLAN OF CARE 1. Patient evaluated today.  2. Instructed to maintain good pedal hygiene and foot care.  3. Mechanical debridement of nails 1-5 bilaterally performed using a nail nipper. Filed with dremel without incident.  4.  Excisional debridement of the hyperkeratotic callus tissue was performed using a tissue nipper without incident or bleeding  5.  Injection of 0.5 cc Celestone Soluspan injection to the anterior aspect of the left ankle joint  6.  Return to clinic in 3 mos.    Edrick Kins, DPM [facility]  Dr.  Edrick Kins, DPM    [address]                                        [place], [place] [address]                Office [phone]  Fax [phone]

## 2020-11-11 ENCOUNTER — Other Ambulatory Visit: Payer: Self-pay | Admitting: Family Medicine

## 2020-11-16 ENCOUNTER — Ambulatory Visit (INDEPENDENT_AMBULATORY_CARE_PROVIDER_SITE_OTHER): Payer: Medicare Other | Admitting: Pharmacist

## 2020-11-16 DIAGNOSIS — E1142 Type 2 diabetes mellitus with diabetic polyneuropathy: Secondary | ICD-10-CM

## 2020-11-16 DIAGNOSIS — E785 Hyperlipidemia, unspecified: Secondary | ICD-10-CM

## 2020-11-16 DIAGNOSIS — I10 Essential (primary) hypertension: Secondary | ICD-10-CM

## 2020-11-16 DIAGNOSIS — N1831 Chronic kidney disease, stage 3a: Secondary | ICD-10-CM

## 2020-11-16 NOTE — Chronic Care Management (AMB) (Signed)
Chronic Care Management Pharmacy Note  11/16/2020 Name:  Douglas [name] MRN:  [id number] DOB:  1945-11-20  Subjective: Douglas [name] is an 75 y.o. year old male who is a primary patient of [name], [name], MD.  The CCM team was consulted for assistance with disease management and care coordination needs.    Engaged with patient by telephone for follow up visit in response to provider referral for pharmacy case management and/or care coordination services.   Consent to Services:  The patient was given information about Chronic Care Management services, agreed to services, and gave verbal consent prior to initiation of services.  Please see initial visit note for detailed documentation.   Patient Care Team: [name], MD as PCP - General (Family Medicine) Wellington Hampshire, MD as PCP - Cardiology (Cardiology) Anthonette Legato, MD (Internal Medicine) Watt Climes, PA as Physician Assistant (Physician Assistant) De Hollingshead, RPH-CPP (Pharmacist)   Objective:  Lab Results  Component Value Date   CREATININE 1.51 (H) 06/05/2020   CREATININE 1.45 06/10/2019   CREATININE 1.49 (H) 01/19/2019    Lab Results  Component Value Date   HGBA1C 7.6 (H) 06/05/2020   Last diabetic Eye exam:  Lab Results  Component Value Date/Time   HMDIABEYEEXA No Retinopathy 03/12/2020 12:00 AM    Last diabetic Foot exam:  Lab Results  Component Value Date/Time   HMDIABFOOTEX Normal 11/24/2014 12:00 AM        Component Value Date/Time   CHOL 115 11/23/2019 1350   CHOL 142 11/12/2018 1142   TRIG 174.0 (H) 11/23/2019 1350   HDL 40.80 11/23/2019 1350   HDL 49 11/12/2018 1142   CHOLHDL 3 11/23/2019 1350   VLDL 34.8 11/23/2019 1350   LDLCALC 40 11/23/2019 1350   LDLCALC 58 11/12/2018 1142    Hepatic Function Latest Ref Rng & Units 11/23/2019 11/12/2018 08/19/2017  Total Protein 6.0 - 8.3 g/dL 6.5 6.3 6.6  Albumin 3.5 - 5.2 g/dL 4.1 4.3 3.8  AST 0 - 37 U/L 26 17 20    ALT 0 - 53 U/L 24 23 20   Alk Phosphatase 39 - 117 U/L 69 75 59  Total Bilirubin 0.2 - 1.2 mg/dL 0.7 0.6 0.6  Bilirubin, Direct 0.0 - 0.3 mg/dL 0.2 - -    Lab Results  Component Value Date/Time   TSH 1.72 03/02/2020 10:36 AM   TSH 3.72 04/21/2019 10:18 AM    CBC Latest Ref Rng & Units 01/19/2019 09/10/2017 02/18/2017  WBC 4.0 - 10.5 K/uL 10.6(H) 9.5 7.5  Hemoglobin 13.0 - 17.0 g/dL 11.8(L) 11.7(L) 13.0  Hematocrit 39.0 - 52.0 % 36.6(L) 36.0(L) 40.3  Platelets 150 - 400 K/uL 258 210 225.0    Lab Results  Component Value Date/Time   VD25OH 83 05/02/2013 01:18 PM    Clinical ASCVD: No  The ASCVD Risk score Mikey Bussing DC Jr., et al., 2013) failed to calculate for the following reasons:   The valid total cholesterol range is 130 to 320 mg/dL      Social History   Tobacco Use  Smoking Status Never  Smokeless Tobacco Never   BP Readings from Last 3 Encounters:  09/06/20 (!) 159/94  06/05/20 138/84  05/03/20 (!) 171/81   Pulse Readings from Last 3 Encounters:  09/06/20 80  06/05/20 71  05/03/20 84   Wt Readings from Last 3 Encounters:  09/06/20 257 lb (116.6 kg)  09/05/20 268 lb (121.6 kg)  06/05/20 268 lb 4 oz (121.7 kg)  Assessment: Review of patient past medical history, allergies, medications, health status, including review of consultants reports, laboratory and other test data, was performed as part of comprehensive evaluation and provision of chronic care management services.   SDOH:  (Social Determinants of Health) assessments and interventions performed:  SDOH Interventions    Flowsheet Row Most Recent Value  SDOH Interventions   Financial Strain Interventions Other (Comment)  [patient assistance]       CCM Care Plan  Allergies  Allergen Reactions   Bee Venom Swelling   Dye Fdc Red [Red Dye] Swelling and Other (See Comments)    Reaction: gout   Omeprazole     Other reaction(s): increased sx on high dose   Atenolol Other (See Comments)    Did not  regulate blood pressure   Benadryl [Diphenhydramine Hcl (Sleep)] Other (See Comments)    Hyperactivity    Enalapril Itching    itching   Gabapentin Palpitations and Rash    rash   Hctz [Hydrochlorothiazide] Other (See Comments)    Decreased potassium    Medications Reviewed Today     Reviewed by De Hollingshead, RPH-CPP (Pharmacist) on 11/16/20 at [place] List Status: <None>   Medication Order Taking? Sig Documenting Provider Last Dose Status Informant  acetaminophen (TYLENOL) 500 MG tablet [id number] Yes Take 500 mg by mouth every 6 (six) hours as needed. [provider] Taking Active   allopurinol (ZYLOPRIM) 300 MG tablet [id number] Yes TAKE 1 TABLET(300 MG) BY MOUTH DAILY [name], MD Taking Active   aspirin 81 MG tablet [id number] Yes Take 1 tablet (81 mg total) by mouth daily. Wellington Hampshire, MD Taking Active Self  BD PEN NEEDLE NANO 2ND GEN 32G X 4 MM MISC [id number] Yes USE DAILY [name], MD Taking Active   betamethasone acetate-betamethasone sodium phosphate (CELESTONE) injection 3 mg [id number]   Edrick Kins, DPM  Active   calcitRIOL (ROCALTROL) 0.25 MCG capsule [id number] Yes Take 0.25 mcg by mouth daily.  [provider] Taking Active Self  carvedilol (COREG) 6.25 MG tablet [id number] Yes Take 6.25 mg by mouth 2 (two) times daily with a meal. [provider] Taking Active   clove oil liquid [id number] Yes Apply 1 application topically as needed. [provider] Taking Active   cyclobenzaprine (FLEXERIL) 10 MG tablet [id number] No Take 1 tablet by mouth as needed.  Patient not taking: Reported on 11/16/2020   [provider] Not Taking Active   dapagliflozin propanediol ([name]) 10 MG TABS tablet [id number] Yes Take 10 mg by mouth daily. [provider] Taking Active   DULoxetine (CYMBALTA) 60 MG capsule [id number] Yes TAKE 1 CAPSULE(60 MG) BY MOUTH DAILY [name], MD Taking Active    esomeprazole (NEXIUM) 40 MG capsule [id number] Yes Take 1 capsule (40 mg total) by mouth daily at 12 noon. Jackolyn Confer, MD Taking Active   finasteride (PROSCAR) 5 MG tablet [id number] Yes TAKE 1 TABLET(5 MG) BY MOUTH AT BEDTIME Stoioff, Ronda Fairly, MD Taking Active   fluticasone (FLONASE) 50 MCG/ACT nasal spray [id number] Yes Place 2 sprays into both nostrils daily. [name], MD Taking Active   furosemide (LASIX) 40 MG tablet [id number] Yes Take 1 tablet (40 mg total) by mouth daily. [name], MD Taking Active   HYDROcodone-acetaminophen (NORCO/VICODIN) 5-325 MG tablet [id number] Yes Take 1 tablet by mouth every 6 (six) hours as needed for moderate pain. [name], MD Taking Active   insulin degludec (  [name] FLEXTOUCH) 200 UNIT/ML FlexTouch Pen [id number] Yes Inject 44 Units into the skin daily. [name], MD Taking Active   levothyroxine (SYNTHROID) 100 MCG tablet [id number] Yes TAKE 1 TABLET(100 MCG) BY MOUTH DAILY BEFORE BREAKFAST [name], MD Taking Active   loratadine (CLARITIN) 10 MG tablet [id number] No Take 10 mg by mouth daily as needed for allergies.  Patient not taking: Reported on 11/16/2020   [provider] Not Taking Active   losartan (COZAAR) 25 MG tablet [id number] Yes TAKE 1 TABLET BY MOUTH EVERY MORNING THEN TAKE 2 TABLETS BY MOUTH EVERY EVENING [name], MD Taking Active   meloxicam (MOBIC) 15 MG tablet [id number] No Take 1 tablet (15 mg total) by mouth daily.  Patient not taking: Reported on 11/16/2020   Edrick Kins, DPM Not Taking Active   metFORMIN (GLUCOPHAGE-XR) 500 MG 24 hr tablet [id number] Yes Take 2 tablets (1,000 mg total) by mouth 2 (two) times daily. [name], MD Taking Active   Multiple Vitamins-Minerals (MULTIVITAMIN MEN PO) [id number] Yes Take 1 tablet by mouth daily. [provider] Taking Active   pregabalin (LYRICA) 100 MG capsule [id number] Yes TAKE 2 CAPSULES BY MOUTH TWICE DAILY  [name], MD Taking Active   Semaglutide, 1 MG/DOSE, (OZEMPIC, 1 MG/DOSE,) 4 MG/3ML SOPN [id number] Yes Inject 1 mg into the skin once a week. [name], MD Taking Active   simvastatin (ZOCOR) 40 MG tablet [id number] Yes TAKE 1 TABLET(40 MG) BY MOUTH AT BEDTIME [name], MD Taking Active   tadalafil (CIALIS) 20 MG tablet [id number] Yes Take by mouth. [provider] Taking Active   testosterone cypionate (DEPOTESTOSTERONE CYPIONATE) 200 MG/ML injection [id number] Yes Inject 200 mg into the muscle every 14 (fourteen) days. [provider] Taking Active             Patient Active Problem List   Diagnosis Date Noted   Abnormal results of liver function studies 08/10/2020   Esophageal reflux 08/10/2020   Low back pain 08/10/2020   Other testicular hypofunction 08/10/2020   Restless legs syndrome 08/10/2020   Skin exam, screening for cancer 03/02/2020   Anemia in chronic kidney disease 02/18/2019   Secondary hyperparathyroidism of renal origin ([place]) 02/18/2019   Right kidney mass 01/20/2019   Erectile dysfunction due to arterial insufficiency 11/09/2018   Respiratory illness 11/05/2018   Tick bite of knee, initial encounter 08/17/2018   Left shoulder pain 07/02/2018   Allergic rhinitis 07/02/2018   Cervical radiculopathy 11/20/2017   Toe pain, bilateral 11/20/2017   Abdominal pain 02/18/2017   Clavicle enlargement 02/18/2017   Anxiety 02/18/2017   Degenerative arthritis of knee, bilateral 09/04/2016   BPH (benign prostatic hyperplasia) 08/18/2016   Knee osteoarthritis 08/18/2016   Hyperlipidemia 05/20/2016   Essential hypertension 05/31/2015   Low testosterone 02/27/2015   Diabetic neuropathy ([place]) 01/05/2013   Obesity (BMI 30-39.9) 01/05/2013   Chronic back pain greater than 3 months duration 01/05/2013   Gout 01/05/2013   OSA (obstructive sleep apnea) 01/05/2013   Chronic kidney disease 01/04/2013   DM type 2 with diabetic  peripheral neuropathy ([place]) 01/04/2013   Hypothyroidism 01/04/2013    Immunization History  Administered Date(s) Administered   Fluad Quad(high Dose 65+) 04/21/2019, 03/02/2020   Influenza, High Dose Seasonal PF 02/15/2016, 02/16/2017, 03/01/2018   Influenza,inj,Quad PF,6+ Mos 01/04/2013, 02/20/2014, 02/27/2015   Moderna Sars-Covid-2 Vaccination 10/10/2019, 11/07/2019   Pneumococcal Conjugate-13 05/02/2013   Pneumococcal Polysaccharide-23 01/05/2011   Tdap 01/05/2008  Conditions to be addressed/monitored: HTN, HLD, and DMII  Care Plan : Medication Management  Updates made by De Hollingshead, RPH-CPP since 11/16/2020 12:00 AM     Problem: Diabetes, Hypertension      Long-Range Goal: Disease Progression Prevention   Start Date: 01/11/2020  This Visit's Progress: On track  Recent Progress: On track  Priority: High  Note:   Current Barriers:  Unable to independently afford treatment regimen Unable to achieve control of diabetes   Pharmacist Clinical Goal(s):  Over the next 90 days, patient will verbalize ability to afford treatment regimen. Over the next 90 days, patient will achieve control of diabetes as evidenced by improvement in A1c through collaboration with PharmD and provider.   Interventions: 1:1 collaboration with [name], MD regarding development and update of comprehensive plan of care as evidenced by provider attestation and co-signature Inter-disciplinary care team collaboration (see longitudinal plan of care) Comprehensive medication review performed; medication list updated in electronic medical record   Diabetes: Controlled per A1c at [facility]; current treatment: Ozempic 1 mg weekly, metformin XR 1000 mg BID, [name] 44 units daily, [name] 10 mg daily Hx [name] - discontinued due to side effects eGFR appropriate for continuation of metformin 1000 mg BID Does report occasional episodes of feeling dizzy, lightheaded when BG  <80 Current glucose readings: using Libre 2 CGM Date of Download: last 14 day Average Glucose: 129 mg/dL - 134-> 114; 132; 142 Time in Goal:  - Time in range 70-180: 97% - Time above range: 3% - Time below range: 0% Receiving patient assistance for Ozempic, [name], [name] through end of the year.  Decrease [name] to 42 units daily to reduce episodes of symptomatic BG. Continue to monitor w/ Libre CGM Continue metformin XR 1000 mg BID, [name] 10 mg daily, Ozempic 1 mg wekely.   Hypertension: Controlled per home readings; current treatment: losartan 25 mg QAM, 50 mg QPM, doxazosin 2 mg QPM- requests today to increase doxazosin dose to 4 mg due to issues with BPH (see below), carvedilol 6.25 mg BID; furosemide 40 mg QAM Afternoon dizziness/sleepiness w/ losartan 50 mg dose in the morning Hx hypokalemia w/ HCTZ Itching w/ enalapril Reported lack of benefit w/ generic atenolol No hx spironolactone.  Reports home BP: 120-130/70-80s, denies dizziness, lightheadedness. Does report some dry mouth that he relates somewhat to dehydration with [place].  Advised to trial reducing furosemide to 20 mg (1/2 tablet) as needed on days that he feels more dry, given recent addition of [name] 10 mg daily. He verbalized understanding. Monitor for increased swelling in feet or ankle.  Continue current antihypertensive regimen at this time.     Hyperlipidemia: Controlled per last lab work; current treatment: simvastatin 40 mg daily Previously recommended to continue current regimen at this time  Peripheral Neuropathy, Chronic Back Pain: Uncontrolled; working with podiatry current treatment: duloxetine 60 mg daily, pregabalin 200 mg BID, hydocodone-APAP 5/325 mg PRN (reports that a prescription for 30 tablets will last about a year), topical clove oil and Salonpas; PT for mobility support; cyclobenzaprine 10 mg PRN Reports he was able to walk a bit in [place] recently while on vacation, which was  encouraging.  Previously recommended to continue current regimen.   CKD: Stable. Current treatment: calcitriol 0.25 mg daily, restarted [name] 10 mg daily per Dr. Holley Raring Reports that he is tolerating [name] better now. Discussed furosemide as above.  Recommended to continue current regimen and collaboration w/ nephrology at this time  BPH:  Reports difficulty  starting stream, weak stream; current regimen: finasteride 5 mg daily, doxazosin 2 mg daily; follows w/ urology Hx tamsulosin, dizziness Hx dizziness with doxazosin 4 mg daily Requests to increase doxazosin to 4 mg daily. Will discuss with PCP. Advised on risk of dry mouth, dizziness. Patient notes he has "dry mouth all the time" so he is not concerned with the risk of this side effect.   Gout: Controlled, wonders if he has a gout flare vs bone spurs vs neuropathy; current treatment: allopurinol 300 mg daily Advised to continue to follow up with podiatry for treatment, as well as collaboration with primary care Recommended to continue current regimen at this time.  Patient Goals/Self-Care Activities Over the next 90 days, patient will:  - take medications as prescribed check glucose three times daily using CGM, document, and provide at future appointments check blood pressure daily, document, and provide at future appointments collaborate with provider on medication access solutions   Follow Up Plan: Telephone follow up appointment with care management team member scheduled for: ~ 12 weeks     Medication Assistance: [name], [name] obtained through [employer] medication assistance program.  Enrollment ends 03/13/21, 04/13/21  Patient's preferred pharmacy is:  [id number] [address]on, [place] [address] [place] [address] Phone: [phone] Fax: [phone]   Follow Up:  Patient agrees to Care Plan and Follow-up.  Plan:  Telephone follow up appointment with care management team member scheduled for:  ~ 12 weeks  Catie Darnelle Maffucci, PharmD, [place], [place] Clinical Pharmacist [employer] [facility] [employer] [phone]

## 2020-11-16 NOTE — Patient Instructions (Signed)
Visit Information  PATIENT GOALS:  Goals Addressed               This Visit's Progress     Patient Stated     Medication Monitoring (pt-stated)        Patient Goals/Self-Care Activities Over the next 90 days, patient will:  - take medications as prescribed check blood glucose three times daily using CGM, document, and provide at future appointments check blood pressure twice daily, document, and provide at future appointments           Patient verbalizes understanding of instructions provided today and agrees to view in [place].   Plan: Telephone follow up appointment with care management team member scheduled for:  ~ 12 weeks  Catie Darnelle Maffucci, PharmD, [place], [place] Clinical Pharmacist [employer] [facility] [employer] [phone]

## 2020-11-19 ENCOUNTER — Ambulatory Visit: Payer: Medicare Other | Admitting: Pharmacist

## 2020-11-19 ENCOUNTER — Telehealth: Payer: Self-pay | Admitting: Pharmacist

## 2020-11-19 DIAGNOSIS — E1142 Type 2 diabetes mellitus with diabetic polyneuropathy: Secondary | ICD-10-CM

## 2020-11-19 DIAGNOSIS — N1831 Chronic kidney disease, stage 3a: Secondary | ICD-10-CM

## 2020-11-19 DIAGNOSIS — I10 Essential (primary) hypertension: Secondary | ICD-10-CM

## 2020-11-19 MED ORDER — DOXAZOSIN MESYLATE 4 MG PO TABS: 4.0000 mg | ORAL_TABLET | Freq: Every day | ORAL | 1 refills | Status: DC

## 2020-11-19 NOTE — Patient Instructions (Signed)
Visit Information  PATIENT GOALS:  Goals Addressed               This Visit's Progress     Patient Stated     Medication Monitoring (pt-stated)        Patient Goals/Self-Care Activities Over the next 90 days, patient will:  - take medications as prescribed check blood glucose three times daily using CGM, document, and provide at future appointments check blood pressure twice daily, document, and provide at future appointments          Patient verbalizes understanding of instructions provided today and agrees to view in [place].    Plan: Telephone follow up appointment with care management team member scheduled for:  ~ 12 weeks as previously scheduled  Catie Darnelle Maffucci, PharmD, [place], [place] Clinical Pharmacist [employer] at [employer] [phone]

## 2020-11-19 NOTE — Telephone Encounter (Signed)
Pt called returning your call 

## 2020-11-19 NOTE — Telephone Encounter (Signed)
  Chronic Care Management   Note  11/19/2020 Name: Douglas [name] MRN: [id number] DOB: 02-12-1946   Attempted to contact patient to follow up on doxazosin dosing after speaking w/ Dr. [name], per our CCM appointment on Friday. Left message with someone at his home asking to call me back when he wakes up.   Catie Darnelle Maffucci, PharmD, [place], [place] Clinical Pharmacist [employer] at [place]

## 2020-11-19 NOTE — Telephone Encounter (Signed)
Spoke with patient. See CCM documentation

## 2020-11-19 NOTE — Chronic Care Management (AMB) (Signed)
Chronic Care Management Pharmacy Note  11/19/2020 Name:  Douglas [name] MRN:  [id number] DOB:  March 30, 1946    Subjective: Douglas [name] is an 75 y.o. year old male who is a primary patient of [name], [name], MD.  The CCM team was consulted for assistance with disease management and care coordination needs.    Engaged with patient by telephone for  medication follow up  in response to provider referral for pharmacy case management and/or care coordination services.   Consent to Services:  The patient was given information about Chronic Care Management services, agreed to services, and gave verbal consent prior to initiation of services.  Please see initial visit note for detailed documentation.   Patient Care Team: [name], MD as PCP - General (Family Medicine) Wellington Hampshire, MD as PCP - Cardiology (Cardiology) Anthonette Legato, MD (Internal Medicine) Watt Climes, PA as Physician Assistant (Physician Assistant) De Hollingshead, RPH-CPP (Pharmacist)   Objective:  Lab Results  Component Value Date   CREATININE 1.51 (H) 06/05/2020   CREATININE 1.45 06/10/2019   CREATININE 1.49 (H) 01/19/2019    Lab Results  Component Value Date   HGBA1C 7.6 (H) 06/05/2020   Last diabetic Eye exam:  Lab Results  Component Value Date/Time   HMDIABEYEEXA No Retinopathy 03/12/2020 12:00 AM    Last diabetic Foot exam:  Lab Results  Component Value Date/Time   HMDIABFOOTEX Normal 11/24/2014 12:00 AM        Component Value Date/Time   CHOL 115 11/23/2019 1350   CHOL 142 11/12/2018 1142   TRIG 174.0 (H) 11/23/2019 1350   HDL 40.80 11/23/2019 1350   HDL 49 11/12/2018 1142   CHOLHDL 3 11/23/2019 1350   VLDL 34.8 11/23/2019 1350   LDLCALC 40 11/23/2019 1350   LDLCALC 58 11/12/2018 1142    Hepatic Function Latest Ref Rng & Units 11/23/2019 11/12/2018 08/19/2017  Total Protein 6.0 - 8.3 g/dL 6.5 6.3 6.6  Albumin 3.5 - 5.2 g/dL 4.1 4.3 3.8  AST 0 - 37 U/L  _0 ALT 0 - 53 U/L _1 Alk Phosphatase 39 - 117 U/L 69 75 59  Total Bilirubin 0.2 - 1.2 mg/dL 0.7 0.6 0.6  Bilirubin, Direct 0.0 - 0.3 mg/dL 0.2 - -    Lab Results  Component Value Date/Time   TSH 1.72 03/02/2020 10:36 AM   TSH 3.72 04/21/2019 10:18 AM    CBC Latest Ref Rng & Units 01/19/2019 09/10/2017 02/18/2017  WBC 4.0 - 10.5 K/uL 10.6(H) 9.5 7.5  Hemoglobin 13.0 - 17.0 g/dL 11.8(L) 11.7(L) 13.0  Hematocrit 39.0 - 52.0 % 36.6(L) 36.0(L) 40.3  Platelets 150 - 400 K/uL 258 210 225.0    Lab Results  Component Value Date/Time   VD25OH 83 05/02/2013 01:18 PM    Clinical ASCVD: No  The ASCVD Risk score Mikey Bussing DC Jr., et al., 2013) failed to calculate for the following reasons:   The valid total cholesterol range is 130 to 320 mg/dL    Social History   Tobacco Use  Smoking Status Never  Smokeless Tobacco Never   BP Readings from Last 3 Encounters:  09/06/20 (!) 159/94  06/05/20 138/84  05/03/20 (!) 171/81   Pulse Readings from Last 3 Encounters:  09/06/20 80  06/05/20 71  05/03/20 84   Wt Readings from Last 3 Encounters:  09/06/20 257 lb (116.6 kg)  09/05/20 268 lb (121.6 kg)  06/05/20 268 lb 4 oz (121.7 kg)  Assessment: Review of patient past medical history, allergies, medications, health status, including review of consultants reports, laboratory and other test data, was performed as part of comprehensive evaluation and provision of chronic care management services.   SDOH:  (Social Determinants of Health) assessments and interventions performed:  SDOH Interventions    Flowsheet Row Most Recent Value  SDOH Interventions   Financial Strain Interventions Intervention Not Indicated  [patient assistance]       CCM Care Plan  Allergies  Allergen Reactions   Bee Venom Swelling   Dye Fdc Red [Red Dye] Swelling and Other (See Comments)    Reaction: gout   Omeprazole     Other reaction(s): increased sx on high dose   Atenolol Other (See  Comments)    Did not regulate blood pressure   Benadryl [Diphenhydramine Hcl (Sleep)] Other (See Comments)    Hyperactivity    Enalapril Itching    itching   Gabapentin Palpitations and Rash    rash   Hctz [Hydrochlorothiazide] Other (See Comments)    Decreased potassium    Medications Reviewed Today     Reviewed by De Hollingshead, RPH-CPP (Pharmacist) on 11/16/20 at [place] List Status: <None>   Medication Order Taking? Sig Documenting Provider Last Dose Status Informant  acetaminophen (TYLENOL) 500 MG tablet [id number] Yes Take 500 mg by mouth every 6 (six) hours as needed. [provider] Taking Active   allopurinol (ZYLOPRIM) 300 MG tablet [id number] Yes TAKE 1 TABLET(300 MG) BY MOUTH DAILY [name], MD Taking Active   aspirin 81 MG tablet [id number] Yes Take 1 tablet (81 mg total) by mouth daily. Wellington Hampshire, MD Taking Active Self  BD PEN NEEDLE NANO 2ND GEN 32G X 4 MM MISC [id number] Yes USE DAILY [name], MD Taking Active   betamethasone acetate-betamethasone sodium phosphate (CELESTONE) injection 3 mg [id number]   Edrick Kins, DPM  Active   calcitRIOL (ROCALTROL) 0.25 MCG capsule [id number] Yes Take 0.25 mcg by mouth daily.  [provider] Taking Active Self  carvedilol (COREG) 6.25 MG tablet [id number] Yes Take 6.25 mg by mouth 2 (two) times daily with a meal. [provider] Taking Active   clove oil liquid [id number] Yes Apply 1 application topically as needed. [provider] Taking Active   cyclobenzaprine (FLEXERIL) 10 MG tablet [id number] No Take 1 tablet by mouth as needed.  Patient not taking: Reported on 11/16/2020   [provider] Not Taking Active   dapagliflozin propanediol ([name]) 10 MG TABS tablet [id number] Yes Take 10 mg by mouth daily. [provider] Taking Active   DULoxetine (CYMBALTA) 60 MG capsule [id number] Yes TAKE 1 CAPSULE(60 MG) BY MOUTH DAILY [name], MD  Taking Active   esomeprazole (NEXIUM) 40 MG capsule [id number] Yes Take 1 capsule (40 mg total) by mouth daily at 12 noon. Jackolyn Confer, MD Taking Active   finasteride (PROSCAR) 5 MG tablet [id number] Yes TAKE 1 TABLET(5 MG) BY MOUTH AT BEDTIME Stoioff, Ronda Fairly, MD Taking Active   fluticasone (FLONASE) 50 MCG/ACT nasal spray [id number] Yes Place 2 sprays into both nostrils daily. [name], MD Taking Active   furosemide (LASIX) 40 MG tablet [id number] Yes Take 1 tablet (40 mg total) by mouth daily. [name], MD Taking Active   HYDROcodone-acetaminophen (NORCO/VICODIN) 5-325 MG tablet [id number] Yes Take 1 tablet by mouth every 6 (six) hours as needed for moderate pain. [name], MD Taking Active   insulin  degludec ([name] FLEXTOUCH) 200 UNIT/ML FlexTouch Pen [id number] Yes Inject 44 Units into the skin daily. [name], MD Taking Active   levothyroxine (SYNTHROID) 100 MCG tablet [id number] Yes TAKE 1 TABLET(100 MCG) BY MOUTH DAILY BEFORE BREAKFAST [name], MD Taking Active   loratadine (CLARITIN) 10 MG tablet [id number] No Take 10 mg by mouth daily as needed for allergies.  Patient not taking: Reported on 11/16/2020   [provider] Not Taking Active   losartan (COZAAR) 25 MG tablet [id number] Yes TAKE 1 TABLET BY MOUTH EVERY MORNING THEN TAKE 2 TABLETS BY MOUTH EVERY EVENING [name], MD Taking Active   meloxicam (MOBIC) 15 MG tablet [id number] No Take 1 tablet (15 mg total) by mouth daily.  Patient not taking: Reported on 11/16/2020   Edrick Kins, DPM Not Taking Active   metFORMIN (GLUCOPHAGE-XR) 500 MG 24 hr tablet [id number] Yes Take 2 tablets (1,000 mg total) by mouth 2 (two) times daily. [name], MD Taking Active   Multiple Vitamins-Minerals (MULTIVITAMIN MEN PO) [id number] Yes Take 1 tablet by mouth daily. [provider] Taking Active   pregabalin (LYRICA) 100 MG capsule [id number] Yes TAKE 2 CAPSULES BY MOUTH  TWICE DAILY [name], MD Taking Active   Semaglutide, 1 MG/DOSE, (OZEMPIC, 1 MG/DOSE,) 4 MG/3ML SOPN [id number] Yes Inject 1 mg into the skin once a week. [name], MD Taking Active   simvastatin (ZOCOR) 40 MG tablet [id number] Yes TAKE 1 TABLET(40 MG) BY MOUTH AT BEDTIME [name], MD Taking Active   tadalafil (CIALIS) 20 MG tablet [id number] Yes Take by mouth. [provider] Taking Active   testosterone cypionate (DEPOTESTOSTERONE CYPIONATE) 200 MG/ML injection [id number] Yes Inject 200 mg into the muscle every 14 (fourteen) days. [provider] Taking Active             Patient Active Problem List   Diagnosis Date Noted   Abnormal results of liver function studies 08/10/2020   Esophageal reflux 08/10/2020   Low back pain 08/10/2020   Other testicular hypofunction 08/10/2020   Restless legs syndrome 08/10/2020   Skin exam, screening for cancer 03/02/2020   Anemia in chronic kidney disease 02/18/2019   Secondary hyperparathyroidism of renal origin ([place]) 02/18/2019   Right kidney mass 01/20/2019   Erectile dysfunction due to arterial insufficiency 11/09/2018   Respiratory illness 11/05/2018   Tick bite of knee, initial encounter 08/17/2018   Left shoulder pain 07/02/2018   Allergic rhinitis 07/02/2018   Cervical radiculopathy 11/20/2017   Toe pain, bilateral 11/20/2017   Abdominal pain 02/18/2017   Clavicle enlargement 02/18/2017   Anxiety 02/18/2017   Degenerative arthritis of knee, bilateral 09/04/2016   BPH (benign prostatic hyperplasia) 08/18/2016   Knee osteoarthritis 08/18/2016   Hyperlipidemia 05/20/2016   Essential hypertension 05/31/2015   Low testosterone 02/27/2015   Diabetic neuropathy ([place]) 01/05/2013   Obesity (BMI 30-39.9) 01/05/2013   Chronic back pain greater than 3 months duration 01/05/2013   Gout 01/05/2013   OSA (obstructive sleep apnea) 01/05/2013   Chronic kidney disease 01/04/2013   DM type 2 with  diabetic peripheral neuropathy ([place]) 01/04/2013   Hypothyroidism 01/04/2013    Immunization History  Administered Date(s) Administered   Fluad Quad(high Dose 65+) 04/21/2019, 03/02/2020   Influenza, High Dose Seasonal PF 02/15/2016, 02/16/2017, 03/01/2018   Influenza,inj,Quad PF,6+ Mos 01/04/2013, 02/20/2014, 02/27/2015   Moderna Sars-Covid-2 Vaccination 10/10/2019, 11/07/2019   Pneumococcal Conjugate-13 05/02/2013   Pneumococcal Polysaccharide-23 01/05/2011   Tdap 01/05/2008  Conditions to be addressed/monitored: HTN, HLD, and DMII  Care Plan : Medication Management  Updates made by De Hollingshead, RPH-CPP since 11/19/2020 12:00 AM     Problem: Diabetes, Hypertension      Long-Range Goal: Disease Progression Prevention   Start Date: 01/11/2020  Recent Progress: On track  Priority: High  Note:   Current Barriers:  Unable to independently afford treatment regimen Unable to achieve control of diabetes   Pharmacist Clinical Goal(s):  Over the next 90 days, patient will verbalize ability to afford treatment regimen. Over the next 90 days, patient will achieve control of diabetes as evidenced by improvement in A1c through collaboration with PharmD and provider.   Interventions: 1:1 collaboration with [name], MD regarding development and update of comprehensive plan of care as evidenced by provider attestation and co-signature Inter-disciplinary care team collaboration (see longitudinal plan of care) Comprehensive medication review performed; medication list updated in electronic medical record   Diabetes: Controlled per A1c at [facility]; current treatment: Ozempic 1 mg weekly, metformin XR 1000 mg BID, [name] 44 units daily, [name] 10 mg daily Hx [name] - discontinued due to side effects Previously recommended to continue current regimen at this time  Hypertension: Controlled per home readings; current treatment: losartan 25 mg QAM, 50 mg  QPM, doxazosin 2 mg QPM- requested to increase doxazosin dose to 4 mg due to issues with BPH (see below), carvedilol 6.25 mg BID; furosemide 40 mg QAM Afternoon dizziness/sleepiness w/ losartan 50 mg dose in the morning Hx hypokalemia w/ HCTZ Itching w/ enalapril Reported lack of benefit w/ generic atenolol No hx spironolactone.  Discussed w/ Dr. [name]. Increase doxazosin to 4 mg daily (for BPH symptoms). Continue to monitor BP at home and monitor for s/sx dizziness, dry mouth, constipation. Patient verbalizes understanding. Follow up with PCP scheduled in 4 weeks.  Hyperlipidemia: Controlled per last lab work; current treatment: simvastatin 40 mg daily Previously recommended to continue current regimen at this time  Peripheral Neuropathy, Chronic Back Pain: Uncontrolled; working with podiatry current treatment: duloxetine 60 mg daily, pregabalin 200 mg BID, hydocodone-APAP 5/325 mg PRN (reports that a prescription for 30 tablets will last about a year), topical clove oil and Salonpas; PT for mobility support; cyclobenzaprine 10 mg PRN Reports he was able to walk a bit in [place] recently while on vacation, which was encouraging.  Previously recommended to continue current regimen.   CKD: Stable. Current treatment: calcitriol 0.25 mg daily, restarted [name] 10 mg daily per Dr. Holley Raring Reports that he is tolerating [name] better now. Discussed furosemide as above.  Recommended to continue current regimen and collaboration w/ nephrology at this time  BPH:  Reports difficulty starting stream, weak stream; current regimen: finasteride 5 mg daily, doxazosin 2 mg daily; follows w/ urology Hx tamsulosin, dizziness Hx dizziness with doxazosin 4 mg daily Requests to increase doxazosin to 4 mg daily. Increased as above. Follow up with urology as scheduled, sooner if no benefit seen with doxazosin dose increase or hypotensive/CNS symptoms become too troublesome to continue increased dose    Gout: Controlled, wonders if he has a gout flare vs bone spurs vs neuropathy; current treatment: allopurinol 300 mg daily Advised to continue to follow up with podiatry for treatment, as well as collaboration with primary care Previously recommended to continue current regimen at this time.  Patient Goals/Self-Care Activities Over the next 90 days, patient will:  - take medications as prescribed check glucose three times daily using CGM, document, and provide  at future appointments check blood pressure daily, document, and provide at future appointments collaborate with provider on medication access solutions   Follow Up Plan: Telephone follow up appointment with care management team member scheduled for: ~ 12 weeks as previously scheduled     Medication Assistance: [name], [name] obtained through [place], [employer] medication assistance program.  Enrollment ends 03/13/21, 04/13/21    Patient's preferred pharmacy is:  [id number] [address], [place] [address] [place] [address] Phone: [phone] Fax: [phone]    Follow Up:  Patient agrees to Care Plan and Follow-up.  Plan: Telephone follow up appointment with care management team member scheduled for:  ~ 12 weeks as previously scheduled  Catie Darnelle Maffucci, PharmD, [place], [place] Clinical Pharmacist [employer] [facility] [employer] [phone]

## 2020-11-22 ENCOUNTER — Telehealth: Payer: Self-pay | Admitting: Family Medicine

## 2020-11-22 DIAGNOSIS — E1142 Type 2 diabetes mellitus with diabetic polyneuropathy: Secondary | ICD-10-CM

## 2020-11-22 NOTE — Telephone Encounter (Signed)
Patient is requesting a refill on his HYDROcodone-acetaminophen (NORCO/VICODIN) 5-325 MG tablet 

## 2020-11-23 MED ORDER — HYDROCODONE-ACETAMINOPHEN 5-325 MG PO TABS: 1.0000 | ORAL_TABLET | Freq: Four times a day (QID) | ORAL | 0 refills | Status: DC | PRN

## 2020-11-23 NOTE — Telephone Encounter (Signed)
Refill sent to pharmacy.   

## 2020-11-24 ENCOUNTER — Other Ambulatory Visit: Payer: Self-pay | Admitting: Family Medicine

## 2020-11-26 NOTE — Telephone Encounter (Signed)
Patient has picked up medication 

## 2020-11-27 ENCOUNTER — Ambulatory Visit: Payer: Medicare Other | Admitting: Pharmacist

## 2020-11-27 DIAGNOSIS — N1831 Chronic kidney disease, stage 3a: Secondary | ICD-10-CM

## 2020-11-27 DIAGNOSIS — E1142 Type 2 diabetes mellitus with diabetic polyneuropathy: Secondary | ICD-10-CM

## 2020-11-27 DIAGNOSIS — I10 Essential (primary) hypertension: Secondary | ICD-10-CM

## 2020-11-27 NOTE — Patient Instructions (Signed)
Visit Information  PATIENT GOALS:  Goals Addressed               This Visit's Progress     Patient Stated     Medication Monitoring (pt-stated)        Patient Goals/Self-Care Activities Over the next 90 days, patient will:  - take medications as prescribed check blood glucose three times daily using CGM, document, and provide at future appointments check blood pressure twice daily, document, and provide at future appointments           Patient verbalizes understanding of instructions provided today and agrees to view in [place].    Plan: Telephone follow up appointment with care management team member scheduled for:  10 weeks as previously scheduled  Catie Darnelle Maffucci, PharmD, [place], [place] Clinical Pharmacist [employer] at [employer] [phone]

## 2020-11-27 NOTE — Chronic Care Management (AMB) (Signed)
Chronic Care Management Pharmacy Note  11/27/2020 Name:  Douglas [name] MRN:  [id number] DOB:  07/18/1945   Subjective: Douglas [name] is an 75 y.o. year old male who is a primary patient of [name], [name], MD.  The CCM team was consulted for assistance with disease management and care coordination needs.    Care coordination  for  medication access  in response to provider referral for pharmacy case management and/or care coordination services.   Consent to Services:  The patient was given information about Chronic Care Management services, agreed to services, and gave verbal consent prior to initiation of services.  Please see initial visit note for detailed documentation.   Patient Care Team: [name], MD as PCP - General (Family Medicine) Wellington Hampshire, MD as PCP - Cardiology (Cardiology) Anthonette Legato, MD (Internal Medicine) Watt Climes, PA as Physician Assistant (Physician Assistant) De Hollingshead, RPH-CPP (Pharmacist)   Objective:  Lab Results  Component Value Date   CREATININE 1.51 (H) 06/05/2020   CREATININE 1.45 06/10/2019   CREATININE 1.49 (H) 01/19/2019    Lab Results  Component Value Date   HGBA1C 7.6 (H) 06/05/2020   Last diabetic Eye exam:  Lab Results  Component Value Date/Time   HMDIABEYEEXA No Retinopathy 03/12/2020 12:00 AM    Last diabetic Foot exam:  Lab Results  Component Value Date/Time   HMDIABFOOTEX Normal 11/24/2014 12:00 AM        Component Value Date/Time   CHOL 115 11/23/2019 1350   CHOL 142 11/12/2018 1142   TRIG 174.0 (H) 11/23/2019 1350   HDL 40.80 11/23/2019 1350   HDL 49 11/12/2018 1142   CHOLHDL 3 11/23/2019 1350   VLDL 34.8 11/23/2019 1350   LDLCALC 40 11/23/2019 1350   LDLCALC 58 11/12/2018 1142    Hepatic Function Latest Ref Rng & Units 11/23/2019 11/12/2018 08/19/2017  Total Protein 6.0 - 8.3 g/dL 6.5 6.3 6.6  Albumin 3.5 - 5.2 g/dL 4.1 4.3 3.8  AST 0 - 37 U/L _0 ALT 0 -  53 U/L _1 Alk Phosphatase 39 - 117 U/L 69 75 59  Total Bilirubin 0.2 - 1.2 mg/dL 0.7 0.6 0.6  Bilirubin, Direct 0.0 - 0.3 mg/dL 0.2 - -    Lab Results  Component Value Date/Time   TSH 1.72 03/02/2020 10:36 AM   TSH 3.72 04/21/2019 10:18 AM    CBC Latest Ref Rng & Units 01/19/2019 09/10/2017 02/18/2017  WBC 4.0 - 10.5 K/uL 10.6(H) 9.5 7.5  Hemoglobin 13.0 - 17.0 g/dL 11.8(L) 11.7(L) 13.0  Hematocrit 39.0 - 52.0 % 36.6(L) 36.0(L) 40.3  Platelets 150 - 400 K/uL 258 210 225.0    Lab Results  Component Value Date/Time   VD25OH 83 05/02/2013 01:18 PM    Clinical ASCVD: No  The ASCVD Risk score Mikey Bussing DC Jr., et al., 2013) failed to calculate for the following reasons:   The valid total cholesterol range is 130 to 320 mg/dL      Social History   Tobacco Use  Smoking Status Never  Smokeless Tobacco Never   BP Readings from Last 3 Encounters:  09/06/20 (!) 159/94  06/05/20 138/84  05/03/20 (!) 171/81   Pulse Readings from Last 3 Encounters:  09/06/20 80  06/05/20 71  05/03/20 84   Wt Readings from Last 3 Encounters:  09/06/20 257 lb (116.6 kg)  09/05/20 268 lb (121.6 kg)  06/05/20 268 lb 4 oz (121.7 kg)  Assessment: Review of patient past medical history, allergies, medications, health status, including review of consultants reports, laboratory and other test data, was performed as part of comprehensive evaluation and provision of chronic care management services.   SDOH:  (Social Determinants of Health) assessments and interventions performed:  SDOH Interventions    Flowsheet Row Most Recent Value  SDOH Interventions   Financial Strain Interventions Other (Comment)  [patient assistance]       CCM Care Plan  Allergies  Allergen Reactions   Bee Venom Swelling   Dye Fdc Red [Red Dye] Swelling and Other (See Comments)    Reaction: gout   Omeprazole     Other reaction(s): increased sx on high dose   Atenolol Other (See Comments)    Did not regulate  blood pressure   Benadryl [Diphenhydramine Hcl (Sleep)] Other (See Comments)    Hyperactivity    Enalapril Itching    itching   Gabapentin Palpitations and Rash    rash   Hctz [Hydrochlorothiazide] Other (See Comments)    Decreased potassium    Medications Reviewed Today     Reviewed by De Hollingshead, RPH-CPP (Pharmacist) on 11/16/20 at [facility] List Status: <None>   Medication Order Taking? Sig Documenting Provider Last Dose Status Informant  acetaminophen (TYLENOL) 500 MG tablet [id number] Yes Take 500 mg by mouth every 6 (six) hours as needed. [provider] Taking Active   allopurinol (ZYLOPRIM) 300 MG tablet [id number] Yes TAKE 1 TABLET(300 MG) BY MOUTH DAILY [name], MD Taking Active   aspirin 81 MG tablet [id number] Yes Take 1 tablet (81 mg total) by mouth daily. Wellington Hampshire, MD Taking Active Self  BD PEN NEEDLE NANO 2ND GEN 32G X 4 MM MISC [id number] Yes USE DAILY [name], MD Taking Active   betamethasone acetate-betamethasone sodium phosphate (CELESTONE) injection 3 mg [id number]   Edrick Kins, DPM  Active   calcitRIOL (ROCALTROL) 0.25 MCG capsule [id number] Yes Take 0.25 mcg by mouth daily.  [provider] Taking Active Self  carvedilol (COREG) 6.25 MG tablet [id number] Yes Take 6.25 mg by mouth 2 (two) times daily with a meal. [provider] Taking Active   clove oil liquid [id number] Yes Apply 1 application topically as needed. [provider] Taking Active   cyclobenzaprine (FLEXERIL) 10 MG tablet [id number] No Take 1 tablet by mouth as needed.  Patient not taking: Reported on 11/16/2020   [provider] Not Taking Active   dapagliflozin propanediol ([name]) 10 MG TABS tablet [id number] Yes Take 10 mg by mouth daily. [provider] Taking Active   DULoxetine (CYMBALTA) 60 MG capsule [id number] Yes TAKE 1 CAPSULE(60 MG) BY MOUTH DAILY [name], MD Taking Active   esomeprazole  (NEXIUM) 40 MG capsule [id number] Yes Take 1 capsule (40 mg total) by mouth daily at 12 noon. Jackolyn Confer, MD Taking Active   finasteride (PROSCAR) 5 MG tablet [id number] Yes TAKE 1 TABLET(5 MG) BY MOUTH AT BEDTIME Stoioff, Ronda Fairly, MD Taking Active   fluticasone (FLONASE) 50 MCG/ACT nasal spray [id number] Yes Place 2 sprays into both nostrils daily. [name], MD Taking Active   furosemide (LASIX) 40 MG tablet [id number] Yes Take 1 tablet (40 mg total) by mouth daily. [name], MD Taking Active   HYDROcodone-acetaminophen (NORCO/VICODIN) 5-325 MG tablet [id number] Yes Take 1 tablet by mouth every 6 (six) hours as needed for moderate pain. [name], MD Taking Active   insulin degludec (  [name] FLEXTOUCH) 200 UNIT/ML FlexTouch Pen [id number] Yes Inject 44 Units into the skin daily. [name], MD Taking Active   levothyroxine (SYNTHROID) 100 MCG tablet [id number] Yes TAKE 1 TABLET(100 MCG) BY MOUTH DAILY BEFORE BREAKFAST [name], MD Taking Active   loratadine (CLARITIN) 10 MG tablet [id number] No Take 10 mg by mouth daily as needed for allergies.  Patient not taking: Reported on 11/16/2020   [provider] Not Taking Active   losartan (COZAAR) 25 MG tablet [id number] Yes TAKE 1 TABLET BY MOUTH EVERY MORNING THEN TAKE 2 TABLETS BY MOUTH EVERY EVENING [name], MD Taking Active   meloxicam (MOBIC) 15 MG tablet [id number] No Take 1 tablet (15 mg total) by mouth daily.  Patient not taking: Reported on 11/16/2020   Edrick Kins, DPM Not Taking Active   metFORMIN (GLUCOPHAGE-XR) 500 MG 24 hr tablet [id number] Yes Take 2 tablets (1,000 mg total) by mouth 2 (two) times daily. [name], MD Taking Active   Multiple Vitamins-Minerals (MULTIVITAMIN MEN PO) [id number] Yes Take 1 tablet by mouth daily. [provider] Taking Active   pregabalin (LYRICA) 100 MG capsule [id number] Yes TAKE 2 CAPSULES BY MOUTH TWICE DAILY [name], MD Taking Active   Semaglutide, 1 MG/DOSE, (OZEMPIC, 1 MG/DOSE,) 4 MG/3ML SOPN [id number] Yes Inject 1 mg into the skin once a week. [name], MD Taking Active   simvastatin (ZOCOR) 40 MG tablet [id number] Yes TAKE 1 TABLET(40 MG) BY MOUTH AT BEDTIME [name], MD Taking Active   tadalafil (CIALIS) 20 MG tablet [id number] Yes Take by mouth. [provider] Taking Active   testosterone cypionate (DEPOTESTOSTERONE CYPIONATE) 200 MG/ML injection [id number] Yes Inject 200 mg into the muscle every 14 (fourteen) days. [provider] Taking Active             Patient Active Problem List   Diagnosis Date Noted   Abnormal results of liver function studies 08/10/2020   Esophageal reflux 08/10/2020   Low back pain 08/10/2020   Other testicular hypofunction 08/10/2020   Restless legs syndrome 08/10/2020   Skin exam, screening for cancer 03/02/2020   Anemia in chronic kidney disease 02/18/2019   Secondary hyperparathyroidism of renal origin ([place]) 02/18/2019   Right kidney mass 01/20/2019   Erectile dysfunction due to arterial insufficiency 11/09/2018   Respiratory illness 11/05/2018   Tick bite of knee, initial encounter 08/17/2018   Left shoulder pain 07/02/2018   Allergic rhinitis 07/02/2018   Cervical radiculopathy 11/20/2017   Toe pain, bilateral 11/20/2017   Abdominal pain 02/18/2017   Clavicle enlargement 02/18/2017   Anxiety 02/18/2017   Degenerative arthritis of knee, bilateral 09/04/2016   BPH (benign prostatic hyperplasia) 08/18/2016   Knee osteoarthritis 08/18/2016   Hyperlipidemia 05/20/2016   Essential hypertension 05/31/2015   Low testosterone 02/27/2015   Diabetic neuropathy ([place]) 01/05/2013   Obesity (BMI 30-39.9) 01/05/2013   Chronic back pain greater than 3 months duration 01/05/2013   Gout 01/05/2013   OSA (obstructive sleep apnea) 01/05/2013   Chronic kidney disease 01/04/2013   DM type 2 with diabetic peripheral neuropathy  ([place]) 01/04/2013   Hypothyroidism 01/04/2013    Immunization History  Administered Date(s) Administered   Fluad Quad(high Dose 65+) 04/21/2019, 03/02/2020   Influenza, High Dose Seasonal PF 02/15/2016, 02/16/2017, 03/01/2018   Influenza,inj,Quad PF,6+ Mos 01/04/2013, 02/20/2014, 02/27/2015   Moderna Sars-Covid-2 Vaccination 10/10/2019, 11/07/2019   Pneumococcal Conjugate-13 05/02/2013   Pneumococcal Polysaccharide-23 01/05/2011   Tdap 01/05/2008  Conditions to be addressed/monitored: HTN, HLD, and DMII  Care Plan : Medication Management  Updates made by De Hollingshead, RPH-CPP since 11/27/2020 12:00 AM     Problem: Diabetes, Hypertension      Long-Range Goal: Disease Progression Prevention   Start Date: 01/11/2020  This Visit's Progress: On track  Recent Progress: On track  Priority: High  Note:   Current Barriers:  Unable to independently afford treatment regimen Unable to achieve control of diabetes   Pharmacist Clinical Goal(s):  Over the next 90 days, patient will verbalize ability to afford treatment regimen. Over the next 90 days, patient will achieve control of diabetes as evidenced by improvement in A1c through collaboration with PharmD and provider.   Interventions: 1:1 collaboration with [name], MD regarding development and update of comprehensive plan of care as evidenced by provider attestation and co-signature Inter-disciplinary care team collaboration (see longitudinal plan of care) Comprehensive medication review performed; medication list updated in electronic medical record  Diabetes: Controlled per A1c at [place]; current treatment: Ozempic 1 mg weekly, metformin XR 1000 mg BID, [name] 44 units daily, [name] 10 mg daily Approved for patient assistance for Ozempic, [name], and [name] through 2022 Previously recommended to continue current regimen at this time Faxed refill for Ozempic, [name], and pen needles to  [employer] patient assistance  Hypertension: Controlled per home readings; current treatment: losartan 25 mg QAM, 50 mg QPM, doxazosin 4 mg QPM-, carvedilol 6.25 mg BID; furosemide 40 mg QAM Afternoon dizziness/sleepiness w/ losartan 50 mg dose in the morning Hx hypokalemia w/ HCTZ Itching w/ enalapril Reported lack of benefit w/ generic atenolol No hx spironolactone.  Previously recommended to continue current regimen. Follow for impact of doxazosin dose increase.   Hyperlipidemia: Controlled per last lab work; current treatment: simvastatin 40 mg daily Previously recommended to continue current regimen at this time  Peripheral Neuropathy, Chronic Back Pain: Uncontrolled; working with podiatry current treatment: duloxetine 60 mg daily, pregabalin 200 mg BID, hydocodone-APAP 5/325 mg PRN (reports that a prescription for 30 tablets will last about a year), topical clove oil and Salonpas; PT for mobility support; cyclobenzaprine 10 mg PRN Previously recommended to continue current regimen.   CKD: Stable. Current treatment: calcitriol 0.25 mg daily, restarted [name] 10 mg daily per Dr. Holley Raring  Previously recommended to continue current regimen and collaboration w/ nephrology at this time  BPH:  Reports difficulty starting stream, weak stream; current regimen: finasteride 5 mg daily, doxazosin 2 mg daily; follows w/ urology Hx tamsulosin, dizziness Hx dizziness with doxazosin 4 mg daily Previously recommended to continue current regimen. Follow for impact of doxazosin dose increase.   Gout: Controlled, wonders if he has a gout flare vs bone spurs vs neuropathy; current treatment: allopurinol 300 mg daily Advised to continue to follow up with podiatry for treatment, as well as collaboration with primary care Previously recommended to continue current regimen at this time.  Patient Goals/Self-Care Activities Over the next 90 days, patient will:  - take medications as  prescribed check glucose three times daily using CGM, document, and provide at future appointments check blood pressure daily, document, and provide at future appointments collaborate with provider on medication access solutions   Follow Up Plan: Telephone follow up appointment with care management team member scheduled for: ~ 10 weeks as previously scheduled     Medication Assistance: [name], [name] obtained through [place], [employer] medication assistance program.  Enrollment ends 03/13/21, 04/13/21    Patient's preferred pharmacy  is:  [id number] [address], [address] [place] [address] Phone: [phone] Fax: [phone]   Follow Up:  Patient agrees to Care Plan and Follow-up.  Plan: Telephone follow up appointment with care management team member scheduled for:  10 weeks as previously scheduled  Catie Darnelle Maffucci, PharmD, [place], [place] Clinical Pharmacist [employer] at [employer] [phone]

## 2020-12-03 ENCOUNTER — Ambulatory Visit: Payer: Medicare Other | Admitting: Pharmacist

## 2020-12-03 DIAGNOSIS — E785 Hyperlipidemia, unspecified: Secondary | ICD-10-CM

## 2020-12-03 DIAGNOSIS — N1831 Chronic kidney disease, stage 3a: Secondary | ICD-10-CM

## 2020-12-03 DIAGNOSIS — I10 Essential (primary) hypertension: Secondary | ICD-10-CM

## 2020-12-03 DIAGNOSIS — E1142 Type 2 diabetes mellitus with diabetic polyneuropathy: Secondary | ICD-10-CM

## 2020-12-03 NOTE — Chronic Care Management (AMB) (Signed)
Chronic Care Management Pharmacy Note  12/03/2020 Name:  Douglas [name] MRN:  [id number] DOB:  [birth year]  Subjective: Douglas [name] is an 75 y.o. year old male who is a primary patient of [name], [name], MD.  The CCM team was consulted for assistance with disease management and care coordination needs.    Care coordination  for  medication access  in response to provider referral for pharmacy case management and/or care coordination services.   Consent to Services:  The patient was given information about Chronic Care Management services, agreed to services, and gave verbal consent prior to initiation of services.  Please see initial visit note for detailed documentation.   Patient Care Team: [name], MD as PCP - General (Family Medicine) Wellington Hampshire, MD as PCP - Cardiology (Cardiology) Anthonette Legato, MD (Internal Medicine) Watt Climes, PA as Physician Assistant (Physician Assistant) De Hollingshead, RPH-CPP (Pharmacist)   Objective:  Lab Results  Component Value Date   CREATININE 1.51 (H) 06/05/2020   CREATININE 1.45 06/10/2019   CREATININE 1.49 (H) 01/19/2019    Lab Results  Component Value Date   HGBA1C 7.6 (H) 06/05/2020   Last diabetic Eye exam:  Lab Results  Component Value Date/Time   HMDIABEYEEXA No Retinopathy 03/12/2020 12:00 AM    Last diabetic Foot exam:  Lab Results  Component Value Date/Time   HMDIABFOOTEX Normal 11/24/2014 12:00 AM        Component Value Date/Time   CHOL 115 11/23/2019 1350   CHOL 142 11/12/2018 1142   TRIG 174.0 (H) 11/23/2019 1350   HDL 40.80 11/23/2019 1350   HDL 49 11/12/2018 1142   CHOLHDL 3 11/23/2019 1350   VLDL 34.8 11/23/2019 1350   LDLCALC 40 11/23/2019 1350   LDLCALC 58 11/12/2018 1142    Hepatic Function Latest Ref Rng & Units 11/23/2019 11/12/2018 08/19/2017  Total Protein 6.0 - 8.3 g/dL 6.5 6.3 6.6  Albumin 3.5 - 5.2 g/dL 4.1 4.3 3.8  AST 0 - 37 U/L _0 ALT 0 - 53  U/L _1 Alk Phosphatase 39 - 117 U/L 69 75 59  Total Bilirubin 0.2 - 1.2 mg/dL 0.7 0.6 0.6  Bilirubin, Direct 0.0 - 0.3 mg/dL 0.2 - -    Lab Results  Component Value Date/Time   TSH 1.72 03/02/2020 10:36 AM   TSH 3.72 04/21/2019 10:18 AM    CBC Latest Ref Rng & Units 01/19/2019 09/10/2017 02/18/2017  WBC 4.0 - 10.5 K/uL 10.6(H) 9.5 7.5  Hemoglobin 13.0 - 17.0 g/dL 11.8(L) 11.7(L) 13.0  Hematocrit 39.0 - 52.0 % 36.6(L) 36.0(L) 40.3  Platelets 150 - 400 K/uL 258 210 225.0    Lab Results  Component Value Date/Time   VD25OH 83 05/02/2013 01:18 PM    Clinical ASCVD: No  The ASCVD Risk score Mikey Bussing DC Jr., et al., 2013) failed to calculate for the following reasons:   The valid total cholesterol range is 130 to 320 mg/dL      Social History   Tobacco Use  Smoking Status Never  Smokeless Tobacco Never   BP Readings from Last 3 Encounters:  09/06/20 (!) 159/94  06/05/20 138/84  05/03/20 (!) 171/81   Pulse Readings from Last 3 Encounters:  09/06/20 80  06/05/20 71  05/03/20 84   Wt Readings from Last 3 Encounters:  09/06/20 257 lb (116.6 kg)  09/05/20 268 lb (121.6 kg)  06/05/20 268 lb 4 oz (121.7 kg)  Assessment: Review of patient past medical history, allergies, medications, health status, including review of consultants reports, laboratory and other test data, was performed as part of comprehensive evaluation and provision of chronic care management services.   SDOH:  (Social Determinants of Health) assessments and interventions performed:  SDOH Interventions    Flowsheet Row Most Recent Value  SDOH Interventions   Financial Strain Interventions Other (Comment)  [manufacturer assistance]       CCM Care Plan  Allergies  Allergen Reactions   Bee Venom Swelling   Dye Fdc Red [Red Dye] Swelling and Other (See Comments)    Reaction: gout   Omeprazole     Other reaction(s): increased sx on high dose   Atenolol Other (See Comments)    Did not regulate  blood pressure   Benadryl [Diphenhydramine Hcl (Sleep)] Other (See Comments)    Hyperactivity    Enalapril Itching    itching   Gabapentin Palpitations and Rash    rash   Hctz [Hydrochlorothiazide] Other (See Comments)    Decreased potassium    Medications Reviewed Today     Reviewed by De Hollingshead, RPH-CPP (Pharmacist) on 11/16/20 at [place] List Status: <None>   Medication Order Taking? Sig Documenting Provider Last Dose Status Informant  acetaminophen (TYLENOL) 500 MG tablet [id number] Yes Take 500 mg by mouth every 6 (six) hours as needed. [provider] Taking Active   allopurinol (ZYLOPRIM) 300 MG tablet [id number] Yes TAKE 1 TABLET(300 MG) BY MOUTH DAILY [name], MD Taking Active   aspirin 81 MG tablet [id number] Yes Take 1 tablet (81 mg total) by mouth daily. Wellington Hampshire, MD Taking Active Self  BD PEN NEEDLE NANO 2ND GEN 32G X 4 MM MISC [id number] Yes USE DAILY [name], MD Taking Active   betamethasone acetate-betamethasone sodium phosphate (CELESTONE) injection 3 mg [id number]   Edrick Kins, DPM  Active   calcitRIOL (ROCALTROL) 0.25 MCG capsule [id number] Yes Take 0.25 mcg by mouth daily.  [provider] Taking Active Self  carvedilol (COREG) 6.25 MG tablet [id number] Yes Take 6.25 mg by mouth 2 (two) times daily with a meal. [provider] Taking Active   clove oil liquid [id number] Yes Apply 1 application topically as needed. [provider] Taking Active   cyclobenzaprine (FLEXERIL) 10 MG tablet [id number] No Take 1 tablet by mouth as needed.  Patient not taking: Reported on 11/16/2020   [provider] Not Taking Active   dapagliflozin propanediol ([name]) 10 MG TABS tablet [id number] Yes Take 10 mg by mouth daily. [provider] Taking Active   DULoxetine (CYMBALTA) 60 MG capsule [id number] Yes TAKE 1 CAPSULE(60 MG) BY MOUTH DAILY [name], MD Taking Active   esomeprazole  (NEXIUM) 40 MG capsule [id number] Yes Take 1 capsule (40 mg total) by mouth daily at 12 noon. Jackolyn Confer, MD Taking Active   finasteride (PROSCAR) 5 MG tablet [id number] Yes TAKE 1 TABLET(5 MG) BY MOUTH AT BEDTIME Stoioff, Ronda Fairly, MD Taking Active   fluticasone (FLONASE) 50 MCG/ACT nasal spray [id number] Yes Place 2 sprays into both nostrils daily. [name], MD Taking Active   furosemide (LASIX) 40 MG tablet [id number] Yes Take 1 tablet (40 mg total) by mouth daily. [name], MD Taking Active   HYDROcodone-acetaminophen (NORCO/VICODIN) 5-325 MG tablet [id number] Yes Take 1 tablet by mouth every 6 (six) hours as needed for moderate pain. [name], MD Taking Active   insulin degludec (  [name] FLEXTOUCH) 200 UNIT/ML FlexTouch Pen [id number] Yes Inject 44 Units into the skin daily. [name], MD Taking Active   levothyroxine (SYNTHROID) 100 MCG tablet [id number] Yes TAKE 1 TABLET(100 MCG) BY MOUTH DAILY BEFORE BREAKFAST [name], MD Taking Active   loratadine (CLARITIN) 10 MG tablet [id number] No Take 10 mg by mouth daily as needed for allergies.  Patient not taking: Reported on 11/16/2020   [provider] Not Taking Active   losartan (COZAAR) 25 MG tablet [id number] Yes TAKE 1 TABLET BY MOUTH EVERY MORNING THEN TAKE 2 TABLETS BY MOUTH EVERY EVENING [name], MD Taking Active   meloxicam (MOBIC) 15 MG tablet [id number] No Take 1 tablet (15 mg total) by mouth daily.  Patient not taking: Reported on 11/16/2020   Edrick Kins, DPM Not Taking Active   metFORMIN (GLUCOPHAGE-XR) 500 MG 24 hr tablet [id number] Yes Take 2 tablets (1,000 mg total) by mouth 2 (two) times daily. [name], MD Taking Active   Multiple Vitamins-Minerals (MULTIVITAMIN MEN PO) [id number] Yes Take 1 tablet by mouth daily. [provider] Taking Active   pregabalin (LYRICA) 100 MG capsule [id number] Yes TAKE 2 CAPSULES BY MOUTH TWICE DAILY [name], MD Taking Active   Semaglutide, 1 MG/DOSE, (OZEMPIC, 1 MG/DOSE,) 4 MG/3ML SOPN [id number] Yes Inject 1 mg into the skin once a week. [name], MD Taking Active   simvastatin (ZOCOR) 40 MG tablet [id number] Yes TAKE 1 TABLET(40 MG) BY MOUTH AT BEDTIME [name], MD Taking Active   tadalafil (CIALIS) 20 MG tablet [id number] Yes Take by mouth. [provider] Taking Active   testosterone cypionate (DEPOTESTOSTERONE CYPIONATE) 200 MG/ML injection [id number] Yes Inject 200 mg into the muscle every 14 (fourteen) days. [provider] Taking Active             Patient Active Problem List   Diagnosis Date Noted   Abnormal results of liver function studies 08/10/2020   Esophageal reflux 08/10/2020   Low back pain 08/10/2020   Other testicular hypofunction 08/10/2020   Restless legs syndrome 08/10/2020   Skin exam, screening for cancer 03/02/2020   Anemia in chronic kidney disease 02/18/2019   Secondary hyperparathyroidism of renal origin ([place]) 02/18/2019   Right kidney mass 01/20/2019   Erectile dysfunction due to arterial insufficiency 11/09/2018   Respiratory illness 11/05/2018   Tick bite of knee, initial encounter 08/17/2018   Left shoulder pain 07/02/2018   Allergic rhinitis 07/02/2018   Cervical radiculopathy 11/20/2017   Toe pain, bilateral 11/20/2017   Abdominal pain 02/18/2017   Clavicle enlargement 02/18/2017   Anxiety 02/18/2017   Degenerative arthritis of knee, bilateral 09/04/2016   BPH (benign prostatic hyperplasia) 08/18/2016   Knee osteoarthritis 08/18/2016   Hyperlipidemia 05/20/2016   Essential hypertension 05/31/2015   Low testosterone 02/27/2015   Diabetic neuropathy ([place]) 01/05/2013   Obesity (BMI 30-39.9) 01/05/2013   Chronic back pain greater than 3 months duration 01/05/2013   Gout 01/05/2013   OSA (obstructive sleep apnea) 01/05/2013   Chronic kidney disease 01/04/2013   DM type 2 with diabetic peripheral neuropathy  ([place]) 01/04/2013   Hypothyroidism 01/04/2013    Immunization History  Administered Date(s) Administered   Fluad Quad(high Dose 65+) 04/21/2019, 03/02/2020   Influenza, High Dose Seasonal PF 02/15/2016, 02/16/2017, 03/01/2018   Influenza,inj,Quad PF,6+ Mos 01/04/2013, 02/20/2014, 02/27/2015   Moderna Sars-Covid-2 Vaccination 10/10/2019, 11/07/2019   Pneumococcal Conjugate-13 05/02/2013   Pneumococcal Polysaccharide-23 01/05/2011   Tdap 01/05/2008  Conditions to be addressed/monitored: HTN and DMII  Care Plan : Medication Management  Updates made by De Hollingshead, RPH-CPP since 12/03/2020 12:00 AM     Problem: Diabetes, Hypertension      Long-Range Goal: Disease Progression Prevention   Start Date: 01/11/2020  Recent Progress: On track  Priority: High  Note:   Current Barriers:  Unable to independently afford treatment regimen Unable to achieve control of diabetes   Pharmacist Clinical Goal(s):  Over the next 90 days, patient will verbalize ability to afford treatment regimen. Over the next 90 days, patient will achieve control of diabetes as evidenced by improvement in A1c through collaboration with PharmD and provider.   Interventions: 1:1 collaboration with [name], MD regarding development and update of comprehensive plan of care as evidenced by provider attestation and co-signature Inter-disciplinary care team collaboration (see longitudinal plan of care) Comprehensive medication review performed; medication list updated in electronic medical record  Diabetes: Controlled per A1c at [place]; current treatment: Ozempic 1 mg weekly, metformin XR 1000 mg BID, [name] 44 units daily, [name] 10 mg daily Approved for patient assistance for Ozempic, [name], and [name] through 2022 Previously recommended to continue current regimen at this time [employer] to f/u on refill order. Processed 11/22/20, should arrive to our office in  10-14 business days (by 8/31)  Hypertension: Controlled per home readings; current treatment: losartan 25 mg QAM, 50 mg QPM, doxazosin 4 mg QPM-, carvedilol 6.25 mg BID; furosemide 40 mg QAM Afternoon dizziness/sleepiness w/ losartan 50 mg dose in the morning Hx hypokalemia w/ HCTZ Itching w/ enalapril Reported lack of benefit w/ generic atenolol No hx spironolactone.  Previously recommended to continue current regimen. Follow for impact of doxazosin dose increase.   Hyperlipidemia: Controlled per last lab work; current treatment: simvastatin 40 mg daily Previously recommended to continue current regimen at this time  Peripheral Neuropathy, Chronic Back Pain: Uncontrolled; working with podiatry current treatment: duloxetine 60 mg daily, pregabalin 200 mg BID, hydocodone-APAP 5/325 mg PRN (reports that a prescription for 30 tablets will last about a year), topical clove oil and Salonpas; PT for mobility support; cyclobenzaprine 10 mg PRN Previously recommended to continue current regimen.   CKD: Stable. Current treatment: calcitriol 0.25 mg daily, restarted [name] 10 mg daily per Dr. Holley Raring  Previously recommended to continue current regimen and collaboration w/ nephrology at this time  BPH:  Reports difficulty starting stream, weak stream; current regimen: finasteride 5 mg daily, doxazosin 2 mg daily; follows w/ urology Hx tamsulosin, dizziness Hx dizziness with doxazosin 4 mg daily Previously recommended to continue current regimen. Follow for impact of doxazosin dose increase.   Gout: Controlled, wonders if he has a gout flare vs bone spurs vs neuropathy; current treatment: allopurinol 300 mg daily Advised to continue to follow up with podiatry for treatment, as well as collaboration with primary care Previously recommended to continue current regimen at this time.  Patient Goals/Self-Care Activities Over the next 90 days, patient will:  - take medications as prescribed check  glucose three times daily using CGM, document, and provide at future appointments check blood pressure daily, document, and provide at future appointments collaborate with provider on medication access solutions   Follow Up Plan: Telephone follow up appointment with care management team member scheduled for: ~ 10 weeks as previously scheduled     Medication Assistance: [name], [name] obtained through [place], [employer] medication assistance program.  Enrollment ends 03/13/21, 04/13/21  Patient's preferred pharmacy is:  [id number] [address], [place] AT [address] [place] [address] Phone: [phone] Fax: [phone]   Follow Up:  Patient agrees to Care Plan and Follow-up.  Plan: Telephone follow up appointment with care management team member scheduled for:  ~ 10 weeks as previously scheduled  Catie Darnelle Maffucci, PharmD, [place], [place] Clinical Pharmacist [employer] at [employer] [phone]

## 2020-12-03 NOTE — Patient Instructions (Signed)
Visit Information  PATIENT GOALS:  Goals Addressed               This Visit's Progress     Patient Stated     Medication Monitoring (pt-stated)        Patient Goals/Self-Care Activities Over the next 90 days, patient will:  - take medications as prescribed check blood glucose three times daily using CGM, document, and provide at future appointments check blood pressure twice daily, document, and provide at future appointments          Patient verbalizes understanding of instructions provided today and agrees to view in [place].   Plan: Telephone follow up appointment with care management team member scheduled for:  ~ 10 weeks as previously scheduled  Catie Darnelle Maffucci, PharmD, [place], [place] Clinical Pharmacist [employer] at [employer] [phone]

## 2020-12-10 ENCOUNTER — Telehealth: Payer: Self-pay | Admitting: Family Medicine

## 2020-12-10 MED ORDER — PREGABALIN 100 MG PO CAPS: 200.0000 mg | ORAL_CAPSULE | Freq: Two times a day (BID) | ORAL | 1 refills | Status: DC

## 2020-12-10 NOTE — Telephone Encounter (Addendum)
Sent to pharmacy.  Please let the patient know.  If the Lyrica is not available for him to pick up later today the patient should let us know so we can see what the issue is.

## 2020-12-10 NOTE — Telephone Encounter (Signed)
Patient insurance calling back in and states the Patient has run out due to the pharmacy only filling 3 pills for the weekend.   Please advise

## 2020-12-10 NOTE — Telephone Encounter (Signed)
Patient has gone without his pregabalin (LYRICA) 100 MG capsule for 5 days, wife states they are waiting for Prior Authorization from office.

## 2020-12-10 NOTE — Telephone Encounter (Signed)
Patient has gone without his pregabalin (LYRICA) 100 MG capsule for 5 days, wife states they are waiting for Prior Authorization from office. I never received a PA for this patient, Deadra Diggins,cma

## 2020-12-10 NOTE — Telephone Encounter (Signed)
Prior authorization for Lyrica has been submitted.  Key: [id number]  Awaiting determination.

## 2020-12-10 NOTE — Telephone Encounter (Signed)
I called and spoke with the patients wife and informed her that the prescription for Lyrica was resent to the pharmacy and if they have any other issues with getting it to call and let us know and she understood.  Krystyna Cleckley,cma

## 2020-12-10 NOTE — Addendum Note (Signed)
Addended by: [name] on: 12/10/2020 03:29 PM   Modules accepted: Orders

## 2020-12-10 NOTE — Telephone Encounter (Signed)
Called to speak with Douglas [name] and spoke with his wife Douglas [name]. Douglas [name] states that he was unable to receive his lyrica because it is prescribed for 4 pills a day. She states that the pharmacy wants to fill it for 3 pills a day and requires a prior authorization to fill it for 4 pills a day.

## 2020-12-11 ENCOUNTER — Telehealth: Payer: Self-pay

## 2020-12-11 NOTE — Telephone Encounter (Signed)
Patient was called and informed the medication Douglas [name], Douglas [name],  and needles has arrived and she understood and pick the medication up today.  Caylor Tallarico,cma

## 2020-12-12 DIAGNOSIS — E785 Hyperlipidemia, unspecified: Secondary | ICD-10-CM | POA: Diagnosis not present

## 2020-12-12 DIAGNOSIS — E1142 Type 2 diabetes mellitus with diabetic polyneuropathy: Secondary | ICD-10-CM | POA: Diagnosis not present

## 2020-12-12 DIAGNOSIS — N1831 Chronic kidney disease, stage 3a: Secondary | ICD-10-CM

## 2020-12-12 DIAGNOSIS — I10 Essential (primary) hypertension: Secondary | ICD-10-CM | POA: Diagnosis not present

## 2020-12-13 ENCOUNTER — Telehealth: Payer: Self-pay

## 2020-12-13 NOTE — Telephone Encounter (Signed)
A fax came in with the approval for Lyrica for the patient  from 12/11/2020-12/11/2021 and I called and informed the patient.  The pharmacy was notified as well.  Joani Cosma,cma

## 2020-12-19 ENCOUNTER — Encounter: Payer: Self-pay | Admitting: Family Medicine

## 2020-12-19 ENCOUNTER — Ambulatory Visit (INDEPENDENT_AMBULATORY_CARE_PROVIDER_SITE_OTHER): Payer: Medicare Other | Admitting: Family Medicine

## 2020-12-19 ENCOUNTER — Other Ambulatory Visit: Payer: Self-pay

## 2020-12-19 VITALS — BP 158/96 | HR 93 | Temp 95.8°F | Ht 70.98 in | Wt 256.8 lb

## 2020-12-19 DIAGNOSIS — E1142 Type 2 diabetes mellitus with diabetic polyneuropathy: Secondary | ICD-10-CM

## 2020-12-19 DIAGNOSIS — I10 Essential (primary) hypertension: Secondary | ICD-10-CM | POA: Diagnosis not present

## 2020-12-19 DIAGNOSIS — R3911 Hesitancy of micturition: Secondary | ICD-10-CM | POA: Diagnosis not present

## 2020-12-19 DIAGNOSIS — N401 Enlarged prostate with lower urinary tract symptoms: Secondary | ICD-10-CM | POA: Diagnosis not present

## 2020-12-19 DIAGNOSIS — Z23 Encounter for immunization: Secondary | ICD-10-CM | POA: Diagnosis not present

## 2020-12-19 DIAGNOSIS — E785 Hyperlipidemia, unspecified: Secondary | ICD-10-CM

## 2020-12-19 LAB — LIPID PANEL
Cholesterol: 101 mg/dL (ref 0–200)
HDL: 29.7 mg/dL — ABNORMAL LOW (ref >39.00)
NonHDL: 70.95
Total CHOL/HDL Ratio: 3
Triglycerides: 229 mg/dL — ABNORMAL HIGH (ref 0.0–149.0)
VLDL: 45.8 mg/dL — ABNORMAL HIGH (ref 0.0–40.0)

## 2020-12-19 LAB — COMPREHENSIVE METABOLIC PANEL
ALT: 9 U/L (ref 0–53)
AST: 12 U/L (ref 0–37)
Albumin: 3.9 g/dL (ref 3.5–5.2)
Alkaline Phosphatase: 62 U/L (ref 39–117)
BUN: 11 mg/dL (ref 6–23)
CO2: 29 mEq/L (ref 19–32)
Calcium: 8.9 mg/dL (ref 8.4–10.5)
Chloride: 100 mEq/L (ref 96–112)
Creatinine, Ser: 1.31 mg/dL (ref 0.40–1.50)
GFR: 53.23 mL/min — ABNORMAL LOW (ref >60.00)
Glucose, Bld: 92 mg/dL (ref 70–99)
Potassium: 4.3 mEq/L (ref 3.5–5.1)
Sodium: 137 mEq/L (ref 135–145)
Total Bilirubin: 0.7 mg/dL (ref 0.2–1.2)
Total Protein: 6.4 g/dL (ref 6.0–8.3)

## 2020-12-19 LAB — HEMOGLOBIN A1C: Hgb A1c MFr Bld: 6.6 % — ABNORMAL HIGH (ref 4.6–6.5)

## 2020-12-19 LAB — LDL CHOLESTEROL, DIRECT: Direct LDL: 40 mg/dL

## 2020-12-19 MED ORDER — PREGABALIN 200 MG PO CAPS: 200.0000 mg | ORAL_CAPSULE | Freq: Two times a day (BID) | ORAL | 1 refills | Status: DC

## 2020-12-19 NOTE — Assessment & Plan Note (Signed)
Generally well controlled.  We may need to consider decreasing his Cardura again.  Discussed continuing with Cardura, Lasix, losartan, and carvedilol.  He will monitor for more frequent low blood pressures or lightheadedness and if that occurs he could decrease the Cardura dose.

## 2020-12-19 NOTE — Assessment & Plan Note (Signed)
Douglas [name] was 54 when he was in the office.  He was given juice as well as crackers and peanut butter and his Douglas [name] came up to 109 on fingerstick.  His recent sugars have been well controlled and this low Douglas [name] seems to be an outlier.  We will check his lab work as outlined.  He will continue the Ozempic 1 mg once weekly, [name] 40 units once daily, metformin 1000 mg twice daily, and [name] 10 mg daily.  I would consider altering his regimen depending on his A1c.

## 2020-12-19 NOTE — Assessment & Plan Note (Signed)
This seems to be more of a positional issue when he sitting on the toilet.  He will try to sit upright more.  He will continue Proscar and Cardura.

## 2020-12-19 NOTE — Patient Instructions (Addendum)
Nice to see you. We will check lab work today. Please let us know if you continue to have low blood sugars. Please continue to monitor your blood pressure.  If you continue to have low blood pressures or feel lightheaded please let us know and we will need to back off on one of your medications.

## 2020-12-19 NOTE — Progress Notes (Signed)
Douglas Rumps, MD Phone: [phone]  Douglas [name] is a 75 y.o. male who presents today for follow-up.  Hypertension: Was 138/83 recently.  That is the typical number he gets though it does at times dropped down to 90/60.  He will get lightheaded with that.  He had increased his Cardura back to a whole tablet to help with prostate issues.  He continues on Lasix, losartan, and carvedilol.  No chest pain or shortness of breath.  Diabetes: Typically 70-120.  He notes he was up all night last night and has had some lows today.  He has been on Ozempic, metformin, [name], and [name] 40 units once daily.  He does note some chronic polydipsia.  His blood sugar was 54 while in the office.  He is asymptomatic.  BPH: The patient has been on Proscar and Cardura.  He notes some stream issues if he is leaning forward while sitting down on the toilet but if he sits more upright this resolves and his flow is good.  Neuropathy: He was off of his Lyrica as we had trouble getting it approved by his insurance.  He has restarted it and notes he is getting it back in his system.  He notes when he first reassured he felt dizzy and foggy though that is improving.  The patient reports he was up all night after his granddaughter overdosed.  She had to go to the hospital and was given Narcan.  He had to complete CPR on her to bring her back to life.  Social History   Tobacco Use  Smoking Status Never  Smokeless Tobacco Never    Current Outpatient Medications on File Prior to Visit  Medication Sig Dispense Refill   acetaminophen (TYLENOL) 500 MG tablet Take 500 mg by mouth every 6 (six) hours as needed.     allopurinol (ZYLOPRIM) 300 MG tablet TAKE 1 TABLET(300 MG) BY MOUTH DAILY 90 tablet 1   aspirin 81 MG tablet Take 1 tablet (81 mg total) by mouth daily.     BD PEN NEEDLE NANO 2ND GEN 32G X 4 MM MISC USE DAILY 100 each 1   calcitRIOL (ROCALTROL) 0.25 MCG capsule Take 0.25 mcg by mouth daily.   5    carvedilol (COREG) 6.25 MG tablet Take 6.25 mg by mouth 2 (two) times daily with a meal.     clove oil liquid Apply 1 application topically as needed.     cyclobenzaprine (FLEXERIL) 10 MG tablet Take 1 tablet by mouth as needed.     dapagliflozin propanediol ([name]) 10 MG TABS tablet Take 10 mg by mouth daily.     doxazosin (CARDURA) 4 MG tablet Take 1 tablet (4 mg total) by mouth daily. 90 tablet 1   DULoxetine (CYMBALTA) 60 MG capsule TAKE 1 CAPSULE(60 MG) BY MOUTH DAILY 90 capsule 1   esomeprazole (NEXIUM) 40 MG capsule Take 1 capsule (40 mg total) by mouth daily at 12 noon. 90 capsule 3   finasteride (PROSCAR) 5 MG tablet TAKE 1 TABLET(5 MG) BY MOUTH AT BEDTIME 90 tablet 3   fluticasone (FLONASE) 50 MCG/ACT nasal spray Place 2 sprays into both nostrils daily. 16 g 6   furosemide (LASIX) 40 MG tablet Take 1 tablet (40 mg total) by mouth daily. 90 tablet 1   HYDROcodone-acetaminophen (NORCO/VICODIN) 5-325 MG tablet Take 1 tablet by mouth every 6 (six) hours as needed for moderate pain. 30 tablet 0   insulin degludec ([name] FLEXTOUCH) 200 UNIT/ML FlexTouch Pen Inject 44 Units  into the skin daily. 15 mL 0   levothyroxine (SYNTHROID) 100 MCG tablet TAKE 1 TABLET(100 MCG) BY MOUTH DAILY BEFORE BREAKFAST 90 tablet 0   loratadine (CLARITIN) 10 MG tablet Take 10 mg by mouth daily as needed for allergies.     losartan (COZAAR) 25 MG tablet TAKE 1 TABLET BY MOUTH EVERY MORNING THEN TAKE 2 TABLETS BY MOUTH EVERY EVENING 270 tablet 0   meloxicam (MOBIC) 15 MG tablet Take 1 tablet (15 mg total) by mouth daily. 30 tablet 1   metFORMIN (GLUCOPHAGE-XR) 500 MG 24 hr tablet Take 2 tablets (1,000 mg total) by mouth 2 (two) times daily. 360 tablet 3   Multiple Vitamins-Minerals (MULTIVITAMIN MEN PO) Take 1 tablet by mouth daily.     pregabalin (LYRICA) 100 MG capsule Take 2 capsules (200 mg total) by mouth 2 (two) times daily. 360 capsule 1   Semaglutide, 1 MG/DOSE, (OZEMPIC, 1 MG/DOSE,) 4 MG/3ML SOPN Inject 1  mg into the skin once a week. 3 mL 3   simvastatin (ZOCOR) 40 MG tablet TAKE 1 TABLET(40 MG) BY MOUTH AT BEDTIME 90 tablet 3   tadalafil (CIALIS) 20 MG tablet Take by mouth.     testosterone cypionate (DEPOTESTOSTERONE CYPIONATE) 200 MG/ML injection Inject 200 mg into the muscle every 14 (fourteen) days.     Current Facility-Administered Medications on File Prior to Visit  Medication Dose Route Frequency Provider Last Rate Last Admin   betamethasone acetate-betamethasone sodium phosphate (CELESTONE) injection 3 mg  3 mg Intra-articular Once Edrick Kins, DPM         ROS see history of present illness  Objective  Physical Exam Vitals:   12/19/20 1121 12/19/20 1135  BP: (!) 160/100 (!) 158/96  Pulse: 93   Temp: (!) 95.8 F (35.4 C)   SpO2: 96%     BP Readings from Last 3 Encounters:  12/19/20 (!) 158/96  09/06/20 (!) 159/94  06/05/20 138/84   Wt Readings from Last 3 Encounters:  12/19/20 256 lb 12.8 oz (116.5 kg)  09/06/20 257 lb (116.6 kg)  09/05/20 268 lb (121.6 kg)    Physical Exam Constitutional:      General: He is not in acute distress.    Appearance: He is not diaphoretic.  Cardiovascular:     Rate and Rhythm: Normal rate and regular rhythm.     Heart sounds: Normal heart sounds.  Pulmonary:     Effort: Pulmonary effort is normal.     Breath sounds: Normal breath sounds.  Musculoskeletal:     Right lower leg: No edema.     Left lower leg: No edema.  Skin:    General: Skin is warm and dry.  Neurological:     Mental Status: He is alert.     Assessment/Plan: Please see individual problem list.  Problem List Items Addressed This Visit     BPH (benign prostatic hyperplasia)    This seems to be more of a positional issue when he sitting on the toilet.  He will try to sit upright more.  He will continue Proscar and Cardura.      Diabetic neuropathy (HCC)    Urine drug screen today.  He will continue on his Lyrica as it has been beneficial.       Relevant Orders   Drug Monitoring Panel [id number] , Urine   DM type 2 with diabetic peripheral neuropathy ([place])    Sugar was 54 when he was in the office.  He was given juice as  well as crackers and peanut butter and his sugar came up to 109 on fingerstick.  His recent sugars have been well controlled and this low sugar seems to be an outlier.  We will check his lab work as outlined.  He will continue the Ozempic 1 mg once weekly, [name] 40 units once daily, metformin 1000 mg twice daily, and [name] 10 mg daily.  I would consider altering his regimen depending on his A1c.      Relevant Orders   HgB A1c   Essential hypertension - Primary    Generally well controlled.  We may need to consider decreasing his Cardura again.  Discussed continuing with Cardura, Lasix, losartan, and carvedilol.  He will monitor for more frequent low blood pressures or lightheadedness and if that occurs he could decrease the Cardura dose.      Relevant Orders   Comp Met (CMET)   Hyperlipidemia   Relevant Orders   Lipid panel   Other Visit Diagnoses     Need for influenza vaccination             Return in about 3 months (around 03/20/2021).  This visit occurred during the SARS-CoV-2 public health emergency.  Safety protocols were in place, including screening questions prior to the visit, additional usage of staff PPE, and extensive cleaning of exam room while observing appropriate contact time as indicated for disinfecting solutions.    Douglas Rumps, MD [place]

## 2020-12-19 NOTE — Addendum Note (Signed)
Addended by: Baron Hamper on: 12/19/2020 01:43 PM   Modules accepted: Orders

## 2020-12-19 NOTE — Assessment & Plan Note (Signed)
Urine drug screen today.  He will continue on his Lyrica as it has been beneficial.

## 2020-12-21 LAB — DRUG MONITORING PANEL 376104, URINE
Amphetamines: NEGATIVE ng/mL (ref <500)
Barbiturates: NEGATIVE ng/mL (ref <300)
Benzodiazepines: NEGATIVE ng/mL (ref <100)
Cocaine Metabolite: NEGATIVE ng/mL (ref <150)
Desmethyltramadol: NEGATIVE ng/mL (ref <100)
Opiates: NEGATIVE ng/mL (ref <100)
Oxycodone: NEGATIVE ng/mL (ref <100)
Tramadol: NEGATIVE ng/mL (ref <100)

## 2020-12-21 LAB — DM TEMPLATE

## 2020-12-31 ENCOUNTER — Other Ambulatory Visit: Payer: Self-pay | Admitting: Family Medicine

## 2021-01-07 ENCOUNTER — Telehealth: Payer: Self-pay | Admitting: Family Medicine

## 2021-01-07 MED ORDER — PREGABALIN 200 MG PO CAPS: 200.0000 mg | ORAL_CAPSULE | Freq: Two times a day (BID) | ORAL | 1 refills | Status: DC

## 2021-01-07 NOTE — Telephone Encounter (Signed)
Estill Bamberg from [facility] called in stating that the patient is running out of his pregablin early and he has been having side effects from missing this medicine.She stated that the patients pharmacy says that if his prescription is changed from 100 mg twice daily to 200 mg twice daily this will helps with the patients medicine supply.Please call Estill Bamberg at [phone].

## 2021-01-07 NOTE — Telephone Encounter (Signed)
Patient's wife called and said patient is out of his pregabalin (LYRICA) 200 MG capsule, pt takes 2x a day.

## 2021-01-07 NOTE — Telephone Encounter (Signed)
Left a message to call back.

## 2021-01-07 NOTE — Telephone Encounter (Signed)
Sent to pharmacy 

## 2021-01-07 NOTE — Addendum Note (Signed)
Addended by: [name] on: 01/07/2021 04:10 PM   Modules accepted: Orders

## 2021-01-07 NOTE — Telephone Encounter (Signed)
Patients wife Douglas [name] returned the call and was informed that we have sent off a 90 day prescription on 12/19/20. [name] verbalized understanding and states that she will contact the pharmacy. She believes that they have been giving her the refills off of [name]'s old prescription.

## 2021-01-08 NOTE — Telephone Encounter (Signed)
Received a call back from [name] asking what to do when they run out of refills and they need more. Informed her that when Douglas [name] is completely out of refills and they are nearing the end of their current prescription, they can call our office and we can send in additional medication after informing his provider. [name] verbalized understanding and had no further questions .

## 2021-01-08 NOTE — Telephone Encounter (Signed)
Called and spoke with Douglas [name] to see if Douglas [name] has picked up his medication. She states that Douglas [name] has picked up his medication and had no further questions.

## 2021-01-15 DIAGNOSIS — Z20822 Contact with and (suspected) exposure to covid-19: Secondary | ICD-10-CM | POA: Diagnosis not present

## 2021-01-16 DIAGNOSIS — Z20822 Contact with and (suspected) exposure to covid-19: Secondary | ICD-10-CM | POA: Diagnosis not present

## 2021-01-23 DIAGNOSIS — E1142 Type 2 diabetes mellitus with diabetic polyneuropathy: Secondary | ICD-10-CM | POA: Diagnosis not present

## 2021-01-23 DIAGNOSIS — R972 Elevated prostate specific antigen [PSA]: Secondary | ICD-10-CM | POA: Diagnosis not present

## 2021-01-23 DIAGNOSIS — E230 Hypopituitarism: Secondary | ICD-10-CM | POA: Diagnosis not present

## 2021-01-24 ENCOUNTER — Ambulatory Visit: Payer: Medicare Other | Admitting: Urology

## 2021-01-24 DIAGNOSIS — E230 Hypopituitarism: Secondary | ICD-10-CM | POA: Diagnosis not present

## 2021-01-24 DIAGNOSIS — R972 Elevated prostate specific antigen [PSA]: Secondary | ICD-10-CM | POA: Diagnosis not present

## 2021-01-31 ENCOUNTER — Ambulatory Visit: Payer: Medicare Other | Admitting: Urology

## 2021-02-01 ENCOUNTER — Ambulatory Visit
Admission: RE | Admit: 2021-02-01 | Discharge: 2021-02-01 | Disposition: A | Discharge status: Home / Self Care | Payer: Medicare Other | Source: Ambulatory Visit | Attending: Urology | Admitting: Urology

## 2021-02-01 DIAGNOSIS — N2889 Other specified disorders of kidney and ureter: Secondary | ICD-10-CM | POA: Diagnosis not present

## 2021-02-01 DIAGNOSIS — K7689 Other specified diseases of liver: Secondary | ICD-10-CM | POA: Diagnosis not present

## 2021-02-01 DIAGNOSIS — N189 Chronic kidney disease, unspecified: Secondary | ICD-10-CM | POA: Diagnosis not present

## 2021-02-01 DIAGNOSIS — N281 Cyst of kidney, acquired: Secondary | ICD-10-CM | POA: Diagnosis not present

## 2021-02-08 ENCOUNTER — Encounter: Payer: Self-pay | Admitting: Podiatry

## 2021-02-08 ENCOUNTER — Other Ambulatory Visit: Payer: Self-pay

## 2021-02-08 ENCOUNTER — Ambulatory Visit (INDEPENDENT_AMBULATORY_CARE_PROVIDER_SITE_OTHER): Payer: Medicare Other | Admitting: Podiatry

## 2021-02-08 DIAGNOSIS — B351 Tinea unguium: Secondary | ICD-10-CM | POA: Diagnosis not present

## 2021-02-08 DIAGNOSIS — M7752 Other enthesopathy of left foot: Secondary | ICD-10-CM | POA: Diagnosis not present

## 2021-02-08 DIAGNOSIS — M79674 Pain in right toe(s): Secondary | ICD-10-CM

## 2021-02-08 DIAGNOSIS — M79675 Pain in left toe(s): Secondary | ICD-10-CM | POA: Diagnosis not present

## 2021-02-08 DIAGNOSIS — M7751 Other enthesopathy of right foot: Secondary | ICD-10-CM

## 2021-02-08 DIAGNOSIS — L989 Disorder of the skin and subcutaneous tissue, unspecified: Secondary | ICD-10-CM

## 2021-02-08 MED ORDER — BETAMETHASONE SOD PHOS & ACET 6 (3-3) MG/ML IJ SUSP
3.0000 mg | Freq: Once | INTRAMUSCULAR | Status: AC
  Administered 2021-02-08: 3 mg via INTRA_ARTICULAR

## 2021-02-08 NOTE — Progress Notes (Signed)
SUBJECTIVE Patient presents to office today complaining of elongated, thickened nails that cause pain while ambulating in shoes.  He is unable to trim his own nails.  He is also been complaining of pain to the medial border of the bilateral great toenails.  He believes the rescanning callus buildup in that area.  Patient is here for further evaluation and treatment.   Patient continues to state that he does get some swelling with low-grade chronic pain to the bilateral ankle joints.  He says the injections helped significantly.  He presents for further treatment and evaluation  Past Medical History:  Diagnosis Date   Arthritis    Depression    Diabetes mellitus with complication ([place])    Diabetic neuropathy ([place])    Diastolic dysfunction    a. TTE 07/2017: EF 60-65%, mild concentric LVH, no RWMA, Gr1DD, trivial AI, mildly dilated LA, RVSF normal, PASP normal   Edema    feet/legs   GERD (gastroesophageal reflux disease)    Gout    History of stress test    a. MV 06/2017: small in size, mild in severity, apical anterior and apical defect that was minimally reversible and most likely represented artifact and less likely ischemia/scar, LVEF 55-65%, low risk, probably normal stress test   Hypertension    Hypothyroidism    Kidney cysts    renal failure 2013   Kidney stones    Dr. Rogers Blocker   OSA (obstructive sleep apnea)    supplemental oxygen at night   Oxygen dependent    hs   [facility] spotted fever 12/10/2017   Positve IgG in titer on 11/20/17   S/P cardiac cath 1998   a. no obstructive disease   Syncope and collapse    Temporary low platelet count ([place])     OBJECTIVE General Patient is awake, alert, and oriented x 3 and in no acute distress. Derm Skin is dry and supple bilateral. Negative open lesions or macerations. Remaining integument unremarkable. Nails are tender, long, thickened and dystrophic with subungual debris, consistent with onychomycosis, 1-5 bilateral. No signs of  infection noted.  Hyperkeratotic preulcerative callus tissue noted to the bilateral toes Vasc  DP and PT pedal pulses palpable bilaterally. Temperature gradient within normal limits.  Neuro Epicritic and protective threshold sensation grossly intact bilaterally.  Musculoskeletal Exam No symptomatic pedal deformities noted bilateral. Muscular strength within normal limits.  Today there is pain on palpation to the anterior aspect of the bilateral ankle joints.  Range of motion within normal limits  ASSESSMENT 1. Onychodystrophic nails 1-5 bilateral with hyperkeratosis of nails.  2. Onychomycosis of nail due to dermatophyte bilateral 3. Pain in foot bilateral 4.  Hyperkeratotic painful callus tissue bilateral great toes 5.  Capsulitis left ankle joint  PLAN OF CARE 1. Patient evaluated today.  2. Instructed to maintain good pedal hygiene and foot care.  3. Mechanical debridement of nails 1-5 bilaterally performed using a nail nipper. Filed with dremel without incident.  4.  Excisional debridement of the hyperkeratotic callus tissue was performed using a tissue nipper without incident or bleeding  5.  Injection of 0.5 cc Celestone Soluspan injection to the anterior aspect of the bilateral ankles [address]. 6.  Return to clinic in 3 mos.    Edrick Kins, DPM [facility]  Dr. Edrick Kins, DPM    [place]  [place], [place] [address]                Office [phone]  Fax [phone]

## 2021-02-13 ENCOUNTER — Ambulatory Visit: Payer: Medicare Other | Admitting: Urology

## 2021-02-13 NOTE — Progress Notes (Deleted)
02/13/2021 7:36 AM   Douglas [name] May 03, 1945 [id number]  Referring provider: [name], MD [address] [place],  [place] [address]  No chief complaint on file.   Urologic history: 1. T1 renal mass -CT angio chest/abdomen/pelvis with incidental enhancing 2.8 x 2.7 x 2.4 cm left upper pole mass 01/2019 -MRI abdomen without contrast 03/13/2019 ordered by nephrology measured at 2.9 x 2.5 x 2.5 cm -Elected surveillance -RUS 07/2019 2.9 x 2.6 x 3.0   2.  BPH with lower urinary tract symptoms -Combination tx doxazosin/finasteride   3.  Erectile dysfunction   HPI: 75 y.o. male presents for follow-up.  Doing well since last visit No bothersome LUTS Remains on doxazosin/finasteride Denies dysuria, gross hematuria Denies flank, abdominal or pelvic pain Renal MRI without contrast 02/01/2021 with a stable left upper pole renal lesion which has not increased in size   PMH: Past Medical History:  Diagnosis Date   Arthritis    Depression    Diabetes mellitus with complication (HCC)    Diabetic neuropathy (HCC)    Diastolic dysfunction    a. TTE 07/2017: EF 60-65%, mild concentric LVH, no RWMA, Gr1DD, trivial AI, mildly dilated LA, RVSF normal, PASP normal   Edema    feet/legs   GERD (gastroesophageal reflux disease)    Gout    History of stress test    a. MV 06/2017: small in size, mild in severity, apical anterior and apical defect that was minimally reversible and most likely represented artifact and less likely ischemia/scar, LVEF 55-65%, low risk, probably normal stress test   Hypertension    Hypothyroidism    Kidney cysts    renal failure 2013   Kidney stones    Dr. Rogers Blocker   OSA (obstructive sleep apnea)    supplemental oxygen at night   Oxygen dependent    hs   [facility] spotted fever 12/10/2017   Positve IgG in titer on 11/20/17   S/P cardiac cath 1998   a. no obstructive disease   Syncope and collapse    Temporary low platelet  count ([place])     Surgical History: Past Surgical History:  Procedure Laterality Date   ANAL FISSURE REPAIR     BACK SURGERY  1977   rupture disc lumbar spine   [facility] with [place] [facility].    CATARACT EXTRACTION W/PHACO Right 07/16/2016   Procedure: CATARACT EXTRACTION PHACO AND INTRAOCULAR LENS PLACEMENT (IOC);  Surgeon: [name], MD;  Location: [facility] ORS;  Service: Ophthalmology;  Laterality: Right;  [place] 01:11 AP% 17.6 CDE 24.83 fluid pack lot # [id number]   CATARACT EXTRACTION W/PHACO Left 08/13/2016   Procedure: CATARACT EXTRACTION PHACO AND INTRAOCULAR LENS PLACEMENT ([employer]) suture placed in left eye at end of procedure;  Surgeon: [name], MD;  Location: [facility] ORS;  Service: Ophthalmology;  Laterality: Left;  [place] 01:55 AP% 22.7 CDE 53.56 fluid pack lot # [id number]   FINGER AMPUTATION     partial   KNEE SURGERY  1993   arthroscopy   SHOULDER SURGERY Bilateral 1998   arthroscopic right, rotator cuff repair left    Home Medications:  Allergies as of 02/13/2021       Reactions   Bee Venom Swelling   Dye Fdc Red [red Dye] Swelling, Other (See Comments)   Reaction: gout   Omeprazole    Other reaction(s): increased sx on high dose   Atenolol Other (See Comments)   Did not  regulate blood pressure   Benadryl [diphenhydramine Hcl (sleep)] Other (See Comments)   Hyperactivity   Enalapril Itching   itching   Gabapentin Palpitations, Rash   rash   Hctz [hydrochlorothiazide] Other (See Comments)   Decreased potassium        Medication List        Accurate as of February 13, 2021  7:36 AM. If you have any questions, ask your nurse or doctor.          acetaminophen 500 MG tablet Commonly known as: TYLENOL Take 500 mg by mouth every 6 (six) hours as needed.   allopurinol 300 MG tablet Commonly known as: ZYLOPRIM TAKE 1 TABLET(300 MG) BY MOUTH DAILY   aspirin 81 MG tablet Take 1 tablet (81 mg total) by  mouth daily.   BD Pen Needle Nano 2nd Gen 32G X 4 MM Misc Generic drug: Insulin Pen Needle USE DAILY   calcitRIOL 0.25 MCG capsule Commonly known as: ROCALTROL Take 0.25 mcg by mouth daily.   carvedilol 6.25 MG tablet Commonly known as: COREG Take 6.25 mg by mouth 2 (two) times daily with a meal.   clove oil liquid Apply 1 application topically as needed.   cyclobenzaprine 10 MG tablet Commonly known as: FLEXERIL Take 1 tablet by mouth as needed.   dapagliflozin propanediol 10 MG Tabs tablet Commonly known as: [name] Take 10 mg by mouth daily.   doxazosin 4 MG tablet Commonly known as: CARDURA Take 1 tablet (4 mg total) by mouth daily.   DULoxetine 60 MG capsule Commonly known as: CYMBALTA TAKE 1 CAPSULE(60 MG) BY MOUTH DAILY   esomeprazole 40 MG capsule Commonly known as: NexIUM Take 1 capsule (40 mg total) by mouth daily at 12 noon.   finasteride 5 MG tablet Commonly known as: PROSCAR TAKE 1 TABLET(5 MG) BY MOUTH AT BEDTIME   fluticasone 50 MCG/ACT nasal spray Commonly known as: FLONASE Place 2 sprays into both nostrils daily.   furosemide 40 MG tablet Commonly known as: LASIX Take 1 tablet (40 mg total) by mouth daily.   HYDROcodone-acetaminophen 5-325 MG tablet Commonly known as: NORCO/VICODIN Take 1 tablet by mouth every 6 (six) hours as needed for moderate pain.   levothyroxine 100 MCG tablet Commonly known as: SYNTHROID TAKE 1 TABLET(100 MCG) BY MOUTH DAILY BEFORE BREAKFAST   loratadine 10 MG tablet Commonly known as: CLARITIN Take 10 mg by mouth daily as needed for allergies.   losartan 25 MG tablet Commonly known as: COZAAR TAKE 1 TABLET BY MOUTH EVERY MORNING THEN TAKE 2 TABLETS BY MOUTH EVERY EVENING   meloxicam 15 MG tablet Commonly known as: MOBIC Take 1 tablet (15 mg total) by mouth daily.   metFORMIN 500 MG 24 hr tablet Commonly known as: GLUCOPHAGE-XR Take 2 tablets (1,000 mg total) by mouth 2 (two) times daily.   MULTIVITAMIN  MEN PO Take 1 tablet by mouth daily.   Ozempic (1 MG/DOSE) 4 MG/3ML Sopn Generic drug: Semaglutide (1 MG/DOSE) Inject 1 mg into the skin once a week.   pregabalin 200 MG capsule Commonly known as: LYRICA Take 1 capsule (200 mg total) by mouth 2 (two) times daily.   simvastatin 40 MG tablet Commonly known as: ZOCOR TAKE 1 TABLET(40 MG) BY MOUTH AT BEDTIME   tadalafil 20 MG tablet Commonly known as: CIALIS Take by mouth.   testosterone cypionate 200 MG/ML injection Commonly known as: DEPOTESTOSTERONE CYPIONATE Inject 200 mg into the muscle every 14 (fourteen) days.   Tyler Aas FlexTouch 200 UNIT/ML  FlexTouch Pen Generic drug: insulin degludec Inject 44 Units into the skin daily.        Allergies:  Allergies  Allergen Reactions   Bee Venom Swelling   Dye Fdc Red [Red Dye] Swelling and Other (See Comments)    Reaction: gout   Omeprazole     Other reaction(s): increased sx on high dose   Atenolol Other (See Comments)    Did not regulate blood pressure   Benadryl [Diphenhydramine Hcl (Sleep)] Other (See Comments)    Hyperactivity    Enalapril Itching    itching   Gabapentin Palpitations and Rash    rash   Hctz [Hydrochlorothiazide] Other (See Comments)    Decreased potassium    Family History: Family History  Problem Relation Age of Onset   Breast cancer Mother    Lung cancer Mother    Bone cancer Mother    Heart Problems Mother    Cancer Mother        breast, lung and rib   AAA (abdominal aortic aneurysm) Mother    Heart disease Mother    Heart attack Father    Heart disease Father    Cancer Brother        esophageal   Heart attack Brother    Colon cancer Neg Hx     Social History:  reports that he has never smoked. He has never used smokeless tobacco. He reports that he does not drink alcohol and does not use drugs.   Physical Exam: There were no vitals taken for this visit.  Constitutional:  Alert and oriented, No acute distress. HEENT: [place] AT,  moist mucus membranes.  Trachea midline, no masses. Cardiovascular: No clubbing, cyanosis, or edema. Respiratory: Normal respiratory effort, no increased work of breathing. GI: Abdomen is soft, nontender, nondistended, no abdominal masses GU: No CVA tenderness Skin: No rashes, bruises or suspicious lesions. Neurologic: Grossly intact, no focal deficits, moving all 4 extremities. Psychiatric: Normal mood and affect.  Laboratory Data: Lab Results  Component Value Date   WBC 10.6 (H) 01/19/2019   HGB 11.8 (L) 01/19/2019   HCT 36.6 (L) 01/19/2019   MCV 89.3 01/19/2019   PLT 258 01/19/2019    Lab Results  Component Value Date   CREATININE 1.31 12/19/2020    Lab Results  Component Value Date   PSA 0.81 01/27/2015    Lab Results  Component Value Date   TESTOSTERONE 199.59 (L) 11/23/2019    Lab Results  Component Value Date   HGBA1C 6.6 (H) 12/19/2020    Urinalysis    Component Value Date/Time   COLORURINE STRAW (A) 01/20/2019 0150   APPEARANCEUR CLEAR (A) 01/20/2019 0150   APPEARANCEUR Clear 06/12/2017 1434   LABSPEC 1.015 01/20/2019 0150   LABSPEC 1.029 03/23/2012 2320   PHURINE 5.0 01/20/2019 0150   GLUCOSEU NEGATIVE 01/20/2019 0150   GLUCOSEU 50 mg/dL 03/23/2012 2320   HGBUR NEGATIVE 01/20/2019 0150   BILIRUBINUR NEGATIVE 01/20/2019 0150   BILIRUBINUR Negative 06/12/2017 1434   BILIRUBINUR Negative 03/23/2012 2320   KETONESUR NEGATIVE 01/20/2019 0150   PROTEINUR NEGATIVE 01/20/2019 0150   NITRITE NEGATIVE 01/20/2019 0150   LEUKOCYTESUR NEGATIVE 01/20/2019 0150   LEUKOCYTESUR Trace 03/23/2012 2320    Lab Results  Component Value Date   LABMICR See below: 06/12/2017   WBCUA None seen 06/12/2017   RBCUA None seen 06/12/2017   LABEPIT None seen 06/12/2017   MUCUS Present (A) 06/12/2017   BACTERIA NONE SEEN 01/20/2019    Pertinent Imaging: *** No results  found for this or any previous visit.  No results found for this or any previous visit.  No  results found for this or any previous visit.  No results found for this or any previous visit.  Results for orders placed during the hospital encounter of 04/30/20  Ultrasound renal complete  Narrative CLINICAL DATA:  Follow-up left renal mass  EXAM: RENAL / URINARY TRACT ULTRASOUND COMPLETE  COMPARISON:  08/01/2019  FINDINGS: Right Kidney:  Renal measurements: 10.9 x 6.4 x 6.0 cm. = volume: 217 mL. No mass lesion or hydronephrosis is noted. Bilobed partially septated cystic lesion is noted in the lower pole which measures 5.5 x 4.4 x 4.1 cm. This is similar in appearance to that seen on prior CT as well as prior ultrasound examination. A smaller 2 cm cyst is noted in the midportion of the right kidney.  Left Kidney:  Renal measurements: 10.9 x 5.7 x 5.1 cm. = volume: 168 mL. 3.0 cm solid lesion is noted in the medial aspect of the left upper pole similar to that seen on the prior exam. Additionally a mildly septated cyst is noted in the lower pole of the left kidney stable in appearance from the prior exam.  Bladder:  Appears normal for degree of bladder distention.  Other:  None.  IMPRESSION: When compared with the prior exam the solid lesion in the upper pole of the left kidney is stable in appearance.  Mildly complicated and simple cysts in the kidneys bilaterally stable from the previous exam.   Electronically Signed By: Inez Catalina M.D. On: 04/30/2020 15:14  No results found for this or any previous visit.  No results found for this or any previous visit.  No results found for this or any previous visit.   Assessment & Plan:    There are no diagnoses linked to this encounter.  No follow-ups on file.  Abbie Sons, [place] [address] [place], [place] [address] [phone]

## 2021-02-15 ENCOUNTER — Other Ambulatory Visit: Payer: Self-pay | Admitting: Family Medicine

## 2021-02-17 NOTE — Telephone Encounter (Signed)
Can you see if he has enough synthroid to last until he is seen? If he does not have enough then it is ok to refill this and we can get labs at his visit.

## 2021-02-18 NOTE — Telephone Encounter (Signed)
Called and spoke to [name]. Douglas [name] states that he has enough synthroid medication to last until his appointment. Lasix and Cymbalta have been refilled.

## 2021-02-21 ENCOUNTER — Ambulatory Visit (INDEPENDENT_AMBULATORY_CARE_PROVIDER_SITE_OTHER): Payer: Medicare Other | Admitting: Pharmacist

## 2021-02-21 DIAGNOSIS — E1142 Type 2 diabetes mellitus with diabetic polyneuropathy: Secondary | ICD-10-CM

## 2021-02-21 DIAGNOSIS — E785 Hyperlipidemia, unspecified: Secondary | ICD-10-CM

## 2021-02-21 DIAGNOSIS — N1831 Chronic kidney disease, stage 3a: Secondary | ICD-10-CM

## 2021-02-21 NOTE — Chronic Care Management (AMB) (Signed)
Chronic Care Management CCM Pharmacy Note  02/21/2021 Name:  Douglas [name] MRN:  [id number] DOB:  23-Aug-1945  Summary: - Due to reapply for patient assistance for 2023. Discussed with wife.   Recommendations/Changes made from today's visit: - Will reschedule follow up call with patient.   Subjective: Douglas [name] is an 75 y.o. year old male who is a primary patient of Douglas [name], Douglas Adam, MD.  The CCM team was consulted for assistance with disease management and care coordination needs.    Unable to get in touch with patient by telephone, spoke with his wife for  medication access  for pharmacy case management and/or care coordination services.   Objective:  Medications Reviewed Today     Reviewed by Baron Hamper, CMA (Certified Medical Assistant) on 12/19/20 at 1121  Med List Status: <None>   Medication Order Taking? Sig Documenting Provider Last Dose Status Informant  acetaminophen (TYLENOL) 500 MG tablet [id number] Yes Take 500 mg by mouth every 6 (six) hours as needed. [provider] Taking Active   allopurinol (ZYLOPRIM) 300 MG tablet [id number] Yes TAKE 1 TABLET(300 MG) BY MOUTH DAILY [name], MD Taking Active   aspirin 81 MG tablet [id number] Yes Take 1 tablet (81 mg total) by mouth daily. Wellington Hampshire, MD Taking Active Self  BD PEN NEEDLE NANO 2ND GEN 32G X 4 MM MISC [id number] Yes USE DAILY [name], MD Taking Active   betamethasone acetate-betamethasone sodium phosphate (CELESTONE) injection 3 mg [id number]   Edrick Kins, DPM  Active   calcitRIOL (ROCALTROL) 0.25 MCG capsule [id number] Yes Take 0.25 mcg by mouth daily.  [provider] Taking Active Self  carvedilol (COREG) 6.25 MG tablet [id number] Yes Take 6.25 mg by mouth 2 (two) times daily with a meal. [provider] Taking Active   clove oil liquid [id number] Yes Apply 1 application topically as needed. [provider] Taking Active    cyclobenzaprine (FLEXERIL) 10 MG tablet [id number] Yes Take 1 tablet by mouth as needed. [provider] Taking Consider Medication Status and Discontinue   dapagliflozin propanediol ([name]) 10 MG TABS tablet [id number] Yes Take 10 mg by mouth daily. [provider] Taking Active   doxazosin (CARDURA) 4 MG tablet [id number] Yes Take 1 tablet (4 mg total) by mouth daily. [name], MD Taking Active   DULoxetine (CYMBALTA) 60 MG capsule [id number] Yes TAKE 1 CAPSULE(60 MG) BY MOUTH DAILY [name], MD Taking Active   esomeprazole (NEXIUM) 40 MG capsule [id number] Yes Take 1 capsule (40 mg total) by mouth daily at 12 noon. Jackolyn Confer, MD Taking Active   finasteride (PROSCAR) 5 MG tablet [id number] Yes TAKE 1 TABLET(5 MG) BY MOUTH AT BEDTIME Stoioff, Ronda Fairly, MD Taking Active   fluticasone (FLONASE) 50 MCG/ACT nasal spray [id number] Yes Place 2 sprays into both nostrils daily. [name], MD Taking Active   furosemide (LASIX) 40 MG tablet [id number] Yes Take 1 tablet (40 mg total) by mouth daily. [name], MD Taking Active   HYDROcodone-acetaminophen (NORCO/VICODIN) 5-325 MG tablet [id number] Yes Take 1 tablet by mouth every 6 (six) hours as needed for moderate pain. [name], MD Taking Active   insulin degludec ([name] FLEXTOUCH) 200 UNIT/ML FlexTouch Pen [id number] Yes Inject 44 Units into the skin daily. [name], MD Taking Active   levothyroxine (SYNTHROID) 100 MCG tablet [id number] Yes TAKE 1 TABLET(100 MCG) BY MOUTH DAILY BEFORE BREAKFAST Douglas [name] Douglas Adam, MD  Taking Active   loratadine (CLARITIN) 10 MG tablet [id number] Yes Take 10 mg by mouth daily as needed for allergies. [provider] Taking Active   losartan (COZAAR) 25 MG tablet [id number] Yes TAKE 1 TABLET BY MOUTH EVERY MORNING THEN TAKE 2 TABLETS BY MOUTH EVERY EVENING [name], MD Taking Active   meloxicam (MOBIC) 15 MG tablet [id number] Yes Take 1  tablet (15 mg total) by mouth daily. Edrick Kins, DPM Taking Active   metFORMIN (GLUCOPHAGE-XR) 500 MG 24 hr tablet [id number] Yes Take 2 tablets (1,000 mg total) by mouth 2 (two) times daily. [name], MD Taking Active   Multiple Vitamins-Minerals (MULTIVITAMIN MEN PO) [id number] Yes Take 1 tablet by mouth daily. [provider] Taking Active   pregabalin (LYRICA) 100 MG capsule [id number] Yes Take 2 capsules (200 mg total) by mouth 2 (two) times daily. [name], MD Taking Active   Semaglutide, 1 MG/DOSE, (OZEMPIC, 1 MG/DOSE,) 4 MG/3ML SOPN [id number] Yes Inject 1 mg into the skin once a week. [name], MD Taking Active   simvastatin (ZOCOR) 40 MG tablet [id number] Yes TAKE 1 TABLET(40 MG) BY MOUTH AT BEDTIME [name], MD Taking Active   tadalafil (CIALIS) 20 MG tablet [id number] Yes Take by mouth. [provider] Taking Active   testosterone cypionate (DEPOTESTOSTERONE CYPIONATE) 200 MG/ML injection [id number] Yes Inject 200 mg into the muscle every 14 (fourteen) days. [provider] Taking Active             Pertinent Labs:   Lab Results  Component Value Date   HGBA1C 6.6 (H) 12/19/2020   Lab Results  Component Value Date   CHOL 101 12/19/2020   HDL 29.70 (L) 12/19/2020   LDLCALC 40 11/23/2019   LDLDIRECT 40.0 12/19/2020   TRIG 229.0 (H) 12/19/2020   CHOLHDL 3 12/19/2020   Lab Results  Component Value Date   CREATININE 1.31 12/19/2020   BUN 11 12/19/2020   NA 137 12/19/2020   K 4.3 12/19/2020   CL 100 12/19/2020   CO2 29 12/19/2020    SDOH:  (Social Determinants of Health) assessments and interventions performed:  SDOH Interventions    Flowsheet Row Most Recent Value  SDOH Interventions   Financial Strain Interventions Other (Comment)  [manufacturer assistance]       CCM Care Plan  Review of patient past medical history, allergies, medications, health status, including review of consultants reports,  laboratory and other test data, was performed as part of comprehensive evaluation and provision of chronic care management services.   Care Plan : Medication Management  Updates made by De Hollingshead, RPH-CPP since 02/21/2021 12:00 AM     Problem: Diabetes, Hypertension      Long-Range Goal: Disease Progression Prevention   Start Date: 01/11/2020  Recent Progress: On track  Priority: High  Note:   Current Barriers:  Unable to independently afford treatment regimen Unable to achieve control of diabetes   Pharmacist Clinical Goal(s):  Over the next 90 days, patient will verbalize ability to afford treatment regimen. Over the next 90 days, patient will achieve control of diabetes as evidenced by improvement in A1c through collaboration with PharmD and provider.   Interventions: 1:1 collaboration with [name], MD regarding development and update of comprehensive plan of care as evidenced by provider attestation and co-signature Inter-disciplinary care team collaboration (see longitudinal plan of care) Comprehensive medication review performed; medication list updated in electronic medical record  Diabetes: Controlled per A1c;  current treatment: Ozempic 1 mg weekly, metformin XR 1000 mg BID, [name] 44 units daily, [name] 10 mg daily Approved for patient assistance for Ozempic, [name], and [name] through 2022 Previously recommended to continue current regimen at this time.  Will collaborate w/ CPhT, patient, and providers to reapply for patient assistance for [name], and [place] for 2023.  Hypertension: Controlled per home readings; current treatment: losartan 25 mg QAM, 50 mg QPM, doxazosin 4 mg QPM, carvedilol 6.25 mg BID; furosemide 40 mg QAM Afternoon dizziness/sleepiness w/ losartan 50 mg dose in the morning Hx hypokalemia w/ HCTZ Itching w/ enalapril Reported lack of benefit w/ generic atenolol No hx spironolactone.  Previously recommended to  continue current regimen.   Hyperlipidemia: Controlled per last lab work; current treatment: simvastatin 40 mg daily Previously recommended to continue current regimen at this time  Peripheral Neuropathy, Chronic Back Pain: Uncontrolled; working with podiatry current treatment: duloxetine 60 mg daily, pregabalin 200 mg BID, hydocodone-APAP 5/325 mg PRN (reports that a prescription for 30 tablets will last about a year), topical clove oil and Salonpas; PT for mobility support; cyclobenzaprine 10 mg PRN Previously recommended to continue current regimen.   CKD: Stable. Current treatment: calcitriol 0.25 mg daily, restarted [name] 10 mg daily per Dr. Holley Raring  Previously recommended to continue current regimen and collaboration w/ nephrology at this time  BPH:  Reports difficulty starting stream, weak stream; current regimen: finasteride 5 mg daily, doxazosin 2 mg daily; follows w/ urology Hx tamsulosin, dizziness Hx dizziness with doxazosin 4 mg daily Previously recommended to continue current regimen. Follow for impact of doxazosin dose increase.   Gout: Controlled, wonders if he has a gout flare vs bone spurs vs neuropathy; current treatment: allopurinol 300 mg daily Advised to continue to follow up with podiatry for treatment, as well as collaboration with primary care Previously recommended to continue current regimen at this time.  Patient Goals/Self-Care Activities Over the next 90 days, patient will:  - take medications as prescribed check glucose three times daily using CGM, document, and provide at future appointments check blood pressure daily, document, and provide at future appointments collaborate with provider on medication access solutions      Plan: Telephone follow up appointment with care management team member scheduled for:  3 months  Catie Darnelle Maffucci, PharmD, [place], [place] Pharmacist [employer] at [employer] [phone]

## 2021-02-21 NOTE — Patient Instructions (Signed)
Visit Information  Patient Goals/Self-Care Activities Over the next 90 days, patient will:  - take medications as prescribed check blood glucose three times daily using CGM, document, and provide at future appointments check blood pressure twice daily, document, and provide at future appointments    Patient verbalizes understanding of instructions provided today and agrees to view in [place].   Plan: Telephone follow up appointment with care management team member scheduled for:  3 months  Catie Darnelle Maffucci, PharmD, [place], [place] Clinical Pharmacist [employer] at [employer] [phone]

## 2021-02-25 ENCOUNTER — Telehealth: Payer: Self-pay

## 2021-02-25 NOTE — Chronic Care Management (AMB) (Signed)
  Care Management   Note  02/25/2021 Name: Douglas [name] MRN: [id number] DOB: April 11, 1946  Douglas [name] is a 75 y.o. year old male who is a primary care patient of [name], MD and is actively engaged with the care management team. I reached out to [name] by phone today to assist with re-scheduling a follow up visit with the Pharmacist  Follow up plan: Unsuccessful telephone outreach attempt made. A HIPAA compliant phone message was left for the patient providing contact information and requesting a return call.  The care management team will reach out to the patient again over the next 7 days.  If patient returns call to provider office, please advise to call [place]  at [place], [place], [place], [place] [address] Direct Dial: [phone] [name] Website: [email or web address]

## 2021-02-27 ENCOUNTER — Telehealth: Payer: Self-pay | Admitting: Family Medicine

## 2021-02-27 NOTE — Chronic Care Management (AMB) (Signed)
  Care Management   Note  02/27/2021 Name: AZEEZ DUNKER MRN: [id number] DOB: June 03, 1945  [name] Gupton is a 75 y.o. year old male who is a primary care patient of [name], [name], MD and is actively engaged with the care management team. I reached out to [name] by phone today to assist with re-scheduling a follow up visit with the Pharmacist  Follow up plan: Telephone appointment with care management team member scheduled for:05/13/2020  Noreene Larsson, [place], [place], [place] [address] Direct Dial: [phone] [email or web address] Website: [email or web address]

## 2021-02-27 NOTE — Telephone Encounter (Signed)
Pt called in stating that someone called his home phone number and didn't leave a message. Check chart. Advise Pt that care coordinator had reach out to him. Transfer Pt to care coordinator. Pt called back in stating that care coordinator had called him a few days ago. Pt request to speak with Dr. [name] nurse. Pt request callback at home number.

## 2021-03-04 ENCOUNTER — Telehealth: Payer: Self-pay | Admitting: Pharmacy Technician

## 2021-03-04 DIAGNOSIS — R600 Localized edema: Secondary | ICD-10-CM | POA: Diagnosis not present

## 2021-03-04 DIAGNOSIS — N2581 Secondary hyperparathyroidism of renal origin: Secondary | ICD-10-CM | POA: Diagnosis not present

## 2021-03-04 DIAGNOSIS — D631 Anemia in chronic kidney disease: Secondary | ICD-10-CM | POA: Diagnosis not present

## 2021-03-04 DIAGNOSIS — I10 Essential (primary) hypertension: Secondary | ICD-10-CM | POA: Diagnosis not present

## 2021-03-04 DIAGNOSIS — Z20828 Contact with and (suspected) exposure to other viral communicable diseases: Secondary | ICD-10-CM | POA: Diagnosis not present

## 2021-03-04 DIAGNOSIS — E1122 Type 2 diabetes mellitus with diabetic chronic kidney disease: Secondary | ICD-10-CM | POA: Diagnosis not present

## 2021-03-04 DIAGNOSIS — Z596 Low income: Secondary | ICD-10-CM

## 2021-03-04 DIAGNOSIS — N1831 Chronic kidney disease, stage 3a: Secondary | ICD-10-CM | POA: Diagnosis not present

## 2021-03-04 NOTE — Progress Notes (Signed)
[facility])                                            [place] Team    03/04/2021  Jarrah Babich Lant 10-30-1945 [id number]  FOR 2023 RE ENROLLMENT                                      Medication Assistance Referral  Referral From: [facility] Embedded RPh Catie T.   Medication/Company: Tyler Aas / [employer] Patient application portion:  Education officer, museum portion: Faxed  to Dr. [name] Provider address/fax verified via: Call to office  Medication/Company: Larna Daughters / [employer] Patient application portion:  Education officer, museum portion: Faxed  to Dr. [name] Provider address/fax verified via: Office website  Medication/Company: Wilder Glade / [name] Patient application portion:  N/A already enrolled for [id number] Provider application portion: Faxed  to Dr. [name] Provider address/fax verified via: Office website    Trivia Heffelfinger P. Gaelen Brager, [place]  [phone]

## 2021-03-05 DIAGNOSIS — D2271 Melanocytic nevi of right lower limb, including hip: Secondary | ICD-10-CM | POA: Diagnosis not present

## 2021-03-05 DIAGNOSIS — L820 Inflamed seborrheic keratosis: Secondary | ICD-10-CM | POA: Diagnosis not present

## 2021-03-05 DIAGNOSIS — D2262 Melanocytic nevi of left upper limb, including shoulder: Secondary | ICD-10-CM | POA: Diagnosis not present

## 2021-03-05 DIAGNOSIS — D2261 Melanocytic nevi of right upper limb, including shoulder: Secondary | ICD-10-CM | POA: Diagnosis not present

## 2021-03-05 DIAGNOSIS — D2272 Melanocytic nevi of left lower limb, including hip: Secondary | ICD-10-CM | POA: Diagnosis not present

## 2021-03-05 DIAGNOSIS — X32XXXA Exposure to sunlight, initial encounter: Secondary | ICD-10-CM | POA: Diagnosis not present

## 2021-03-05 DIAGNOSIS — R208 Other disturbances of skin sensation: Secondary | ICD-10-CM | POA: Diagnosis not present

## 2021-03-05 DIAGNOSIS — L570 Actinic keratosis: Secondary | ICD-10-CM | POA: Diagnosis not present

## 2021-03-05 DIAGNOSIS — L821 Other seborrheic keratosis: Secondary | ICD-10-CM | POA: Diagnosis not present

## 2021-03-05 DIAGNOSIS — D225 Melanocytic nevi of trunk: Secondary | ICD-10-CM | POA: Diagnosis not present

## 2021-03-05 DIAGNOSIS — L538 Other specified erythematous conditions: Secondary | ICD-10-CM | POA: Diagnosis not present

## 2021-03-08 ENCOUNTER — Other Ambulatory Visit: Payer: Self-pay | Admitting: Family Medicine

## 2021-03-11 ENCOUNTER — Telehealth: Payer: Self-pay | Admitting: Family Medicine

## 2021-03-11 NOTE — Telephone Encounter (Signed)
Patient called and said that he has been out of his losartan (COZAAR) 25 MG tablet. He was told by pharmacy that this medication needs a prior authorization. Patient has been out of medication for 3 days.

## 2021-03-12 ENCOUNTER — Ambulatory Visit: Payer: Medicare Other | Admitting: Pharmacist

## 2021-03-12 DIAGNOSIS — E1142 Type 2 diabetes mellitus with diabetic polyneuropathy: Secondary | ICD-10-CM

## 2021-03-12 DIAGNOSIS — N1831 Chronic kidney disease, stage 3a: Secondary | ICD-10-CM

## 2021-03-12 MED ORDER — DAPAGLIFLOZIN PROPANEDIOL 10 MG PO TABS: 10.0000 mg | ORAL_TABLET | Freq: Every day | ORAL | 3 refills | Status: DC

## 2021-03-12 NOTE — Chronic Care Management (AMB) (Signed)
Chronic Care Management CCM Pharmacy Note  03/12/2021 Name:  Douglas [name] MRN:  [id number] DOB:  [birth year]  Summary: - Refill for Wilder Glade needed for patient assistance to [employer]  Recommendations/Changes made from today's visit: - Escribed to [employer] patient assistance  Subjective: Douglas [name] is an 75 y.o. year old male who is a primary patient of [name], [name], MD.  The CCM team was consulted for assistance with disease management and care coordination needs.    Care coordination  for  medication access  for pharmacy case management and/or care coordination services.   Objective:  Medications Reviewed Today     Reviewed by Baron Hamper, CMA (Certified Medical Assistant) on 12/19/20 at 1121  Med List Status: <None>   Medication Order Taking? Sig Documenting Provider Last Dose Status Informant  acetaminophen (TYLENOL) 500 MG tablet [id number] Yes Take 500 mg by mouth every 6 (six) hours as needed. [provider] Taking Active   allopurinol (ZYLOPRIM) 300 MG tablet [id number] Yes TAKE 1 TABLET(300 MG) BY MOUTH DAILY [name], MD Taking Active   aspirin 81 MG tablet [id number] Yes Take 1 tablet (81 mg total) by mouth daily. Wellington Hampshire, MD Taking Active Self  BD PEN NEEDLE NANO 2ND GEN 32G X 4 MM MISC [id number] Yes USE DAILY [name], MD Taking Active   betamethasone acetate-betamethasone sodium phosphate (CELESTONE) injection 3 mg [id number]   Edrick Kins, DPM  Active   calcitRIOL (ROCALTROL) 0.25 MCG capsule [id number] Yes Take 0.25 mcg by mouth daily.  [provider] Taking Active Self  carvedilol (COREG) 6.25 MG tablet [id number] Yes Take 6.25 mg by mouth 2 (two) times daily with a meal. [provider] Taking Active   clove oil liquid [id number] Yes Apply 1 application topically as needed. [provider] Taking Active   cyclobenzaprine (FLEXERIL) 10 MG tablet [id number] Yes Take 1 tablet by  mouth as needed. [provider] Taking Consider Medication Status and Discontinue   dapagliflozin propanediol ([name]) 10 MG TABS tablet [id number] Yes Take 10 mg by mouth daily. [provider] Taking Active   doxazosin (CARDURA) 4 MG tablet [id number] Yes Take 1 tablet (4 mg total) by mouth daily. [name], MD Taking Active   DULoxetine (CYMBALTA) 60 MG capsule [id number] Yes TAKE 1 CAPSULE(60 MG) BY MOUTH DAILY [name], MD Taking Active   esomeprazole (NEXIUM) 40 MG capsule [id number] Yes Take 1 capsule (40 mg total) by mouth daily at 12 noon. Jackolyn Confer, MD Taking Active   finasteride (PROSCAR) 5 MG tablet [id number] Yes TAKE 1 TABLET(5 MG) BY MOUTH AT BEDTIME Stoioff, Ronda Fairly, MD Taking Active   fluticasone (FLONASE) 50 MCG/ACT nasal spray [id number] Yes Place 2 sprays into both nostrils daily. [name], MD Taking Active   furosemide (LASIX) 40 MG tablet [id number] Yes Take 1 tablet (40 mg total) by mouth daily. [name], MD Taking Active   HYDROcodone-acetaminophen (NORCO/VICODIN) 5-325 MG tablet [id number] Yes Take 1 tablet by mouth every 6 (six) hours as needed for moderate pain. [name], MD Taking Active   insulin degludec ([name] FLEXTOUCH) 200 UNIT/ML FlexTouch Pen [id number] Yes Inject 44 Units into the skin daily. [name], MD Taking Active   levothyroxine (SYNTHROID) 100 MCG tablet [id number] Yes TAKE 1 TABLET(100 MCG) BY MOUTH DAILY BEFORE BREAKFAST [name], MD Taking Active   loratadine (CLARITIN) 10 MG tablet [id number] Yes Take 10 mg by  mouth daily as needed for allergies. [provider] Taking Active   losartan (COZAAR) 25 MG tablet [id number] Yes TAKE 1 TABLET BY MOUTH EVERY MORNING THEN TAKE 2 TABLETS BY MOUTH EVERY EVENING [name], MD Taking Active   meloxicam (MOBIC) 15 MG tablet [id number] Yes Take 1 tablet (15 mg total) by mouth daily. Edrick Kins, DPM Taking Active    metFORMIN (GLUCOPHAGE-XR) 500 MG 24 hr tablet [id number] Yes Take 2 tablets (1,000 mg total) by mouth 2 (two) times daily. [name], MD Taking Active   Multiple Vitamins-Minerals (MULTIVITAMIN MEN PO) [id number] Yes Take 1 tablet by mouth daily. [provider] Taking Active   pregabalin (LYRICA) 100 MG capsule [id number] Yes Take 2 capsules (200 mg total) by mouth 2 (two) times daily. [name], MD Taking Active   Semaglutide, 1 MG/DOSE, (OZEMPIC, 1 MG/DOSE,) 4 MG/3ML SOPN [id number] Yes Inject 1 mg into the skin once a week. [name], MD Taking Active   simvastatin (ZOCOR) 40 MG tablet [id number] Yes TAKE 1 TABLET(40 MG) BY MOUTH AT BEDTIME [name], MD Taking Active   tadalafil (CIALIS) 20 MG tablet [id number] Yes Take by mouth. [provider] Taking Active   testosterone cypionate (DEPOTESTOSTERONE CYPIONATE) 200 MG/ML injection [id number] Yes Inject 200 mg into the muscle every 14 (fourteen) days. [provider] Taking Active             Pertinent Labs:   Lab Results  Component Value Date   HGBA1C 6.6 (H) 12/19/2020   Lab Results  Component Value Date   CHOL 101 12/19/2020   HDL 29.70 (L) 12/19/2020   LDLCALC 40 11/23/2019   LDLDIRECT 40.0 12/19/2020   TRIG 229.0 (H) 12/19/2020   CHOLHDL 3 12/19/2020   Lab Results  Component Value Date   CREATININE 1.31 12/19/2020   BUN 11 12/19/2020   NA 137 12/19/2020   K 4.3 12/19/2020   CL 100 12/19/2020   CO2 29 12/19/2020    SDOH:  (Social Determinants of Health) assessments and interventions performed:  SDOH Interventions    Flowsheet Row Most Recent Value  SDOH Interventions   Financial Strain Interventions Other (Comment)  [manufacturer assistance]       CCM Care Plan  Review of patient past medical history, allergies, medications, health status, including review of consultants reports, laboratory and other test data, was performed as part of comprehensive  evaluation and provision of chronic care management services.   Care Plan : Medication Management  Updates made by De Hollingshead, RPH-CPP since 03/12/2021 12:00 AM     Problem: Diabetes, Hypertension      Long-Range Goal: Disease Progression Prevention   Start Date: 01/11/2020  Recent Progress: On track  Priority: High  Note:   Current Barriers:  Unable to independently afford treatment regimen Unable to achieve control of diabetes   Pharmacist Clinical Goal(s):  Over the next 90 days, patient will verbalize ability to afford treatment regimen. Over the next 90 days, patient will achieve control of diabetes as evidenced by improvement in A1c through collaboration with PharmD and provider.   Interventions: 1:1 collaboration with [name], MD regarding development and update of comprehensive plan of care as evidenced by provider attestation and co-signature Inter-disciplinary care team collaboration (see longitudinal plan of care) Comprehensive medication review performed; medication list updated in electronic medical record  Diabetes: Controlled per A1c; current treatment: Ozempic 1 mg weekly, metformin XR 1000 mg BID, [name] 44 units daily,  [name] 10 mg daily Approved for patient assistance for Ozempic, [name], and [name] through 2022 Refill for [name] needed for [employer] patient assistance. Sent today.   Hypertension: Controlled per home readings; current treatment: losartan 25 mg QAM, 50 mg QPM, doxazosin 4 mg QPM, carvedilol 6.25 mg BID; furosemide 40 mg QAM Afternoon dizziness/sleepiness w/ losartan 50 mg dose in the morning Hx hypokalemia w/ HCTZ Itching w/ enalapril Reported lack of benefit w/ generic atenolol No hx spironolactone.  Previously recommended to continue current regimen.   Hyperlipidemia: Controlled per last lab work; current treatment: simvastatin 40 mg daily Previously recommended to continue current regimen at this  time  Peripheral Neuropathy, Chronic Back Pain: Uncontrolled; working with podiatry current treatment: duloxetine 60 mg daily, pregabalin 200 mg BID, hydocodone-APAP 5/325 mg PRN (reports that a prescription for 30 tablets will last about a year), topical clove oil and Salonpas; PT for mobility support; cyclobenzaprine 10 mg PRN Previously recommended to continue current regimen.   CKD: Stable. Current treatment: calcitriol 0.25 mg daily, restarted [name] 10 mg daily per Dr. Holley Raring  Previously recommended to continue current regimen and collaboration w/ nephrology at this time  BPH:  Reports difficulty starting stream, weak stream; current regimen: finasteride 5 mg daily, doxazosin 2 mg daily; follows w/ urology Hx tamsulosin, dizziness Hx dizziness with doxazosin 4 mg daily Previously recommended to continue current regimen. Follow for impact of doxazosin dose increase.   Gout: Controlled, wonders if he has a gout flare vs bone spurs vs neuropathy; current treatment: allopurinol 300 mg daily Advised to continue to follow up with podiatry for treatment, as well as collaboration with primary care Previously recommended to continue current regimen at this time.  Patient Goals/Self-Care Activities Over the next 90 days, patient will:  - take medications as prescribed check glucose three times daily using CGM, document, and provide at future appointments check blood pressure daily, document, and provide at future appointments collaborate with provider on medication access solutions      Plan: Telephone follow up appointment with care management team member scheduled for:  2 months  Catie Darnelle Maffucci, PharmD, [place], [place] Pharmacist [employer] at [employer] [phone]

## 2021-03-12 NOTE — Patient Instructions (Signed)
Visit Information  Following are the goals we discussed today:  Patient Goals/Self-Care Activities Over the next 90 days, patient will:  - take medications as prescribed check glucose three times daily using CGM, document, and provide at future appointments check blood pressure daily, document, and provide at future appointments collaborate with provider on medication access solutions        Plan: Telephone follow up appointment with care management team member scheduled for:  2 months   Catie Darnelle Maffucci, PharmD, [place], CPP Clinical Pharmacist [place] at [facility] [phone]     Please call the care guide team at [phone] if you need to cancel or reschedule your appointment.   Patient verbalizes understanding of instructions provided today and agrees to view in [place].

## 2021-03-12 NOTE — Telephone Encounter (Signed)
Filed PA on Cover my meds.Shandon Dannielle Karvonen Key: Maylene Roes

## 2021-03-13 DIAGNOSIS — E785 Hyperlipidemia, unspecified: Secondary | ICD-10-CM

## 2021-03-13 DIAGNOSIS — N1831 Chronic kidney disease, stage 3a: Secondary | ICD-10-CM

## 2021-03-13 DIAGNOSIS — I129 Hypertensive chronic kidney disease with stage 1 through stage 4 chronic kidney disease, or unspecified chronic kidney disease: Secondary | ICD-10-CM | POA: Diagnosis not present

## 2021-03-13 DIAGNOSIS — E1142 Type 2 diabetes mellitus with diabetic polyneuropathy: Secondary | ICD-10-CM

## 2021-03-14 ENCOUNTER — Telehealth: Payer: Self-pay

## 2021-03-14 NOTE — Telephone Encounter (Signed)
A fax was received for [employer]BS and the patient was approved for Potassium 25 mg. The approval will end on 03/12/2022. Patient and pharmacy was notified.  Elizar Alpern,cma

## 2021-03-15 ENCOUNTER — Telehealth: Payer: Self-pay | Admitting: Pharmacy Technician

## 2021-03-15 ENCOUNTER — Ambulatory Visit: Payer: Medicare Other | Admitting: Podiatry

## 2021-03-15 DIAGNOSIS — Z596 Low income: Secondary | ICD-10-CM

## 2021-03-15 NOTE — Progress Notes (Signed)
[facility])                                            [place] Team    03/15/2021  Douglas [name] Aug 14, 1945 [id number]  Received provider portion(s) of patient assistance application(s) for [name]. Embedded PharmD escribed completed prescription into [name] as patient is automatically enrolled for 2023 as he was enrolled in 2022 and has Medicare Part D per program guidelines.   Breannah Kratt P. Jacon Whetzel, [place]  [phone]

## 2021-03-19 ENCOUNTER — Other Ambulatory Visit: Payer: Self-pay

## 2021-03-19 ENCOUNTER — Ambulatory Visit (INDEPENDENT_AMBULATORY_CARE_PROVIDER_SITE_OTHER): Payer: Medicare Other | Admitting: Podiatry

## 2021-03-19 DIAGNOSIS — L600 Ingrowing nail: Secondary | ICD-10-CM | POA: Diagnosis not present

## 2021-03-19 NOTE — Progress Notes (Signed)
Subjective: 75 y.o. male presenting today for follow-up evaluation of bilateral great toe pain.  Patient states that he is experiencing pain around the base of the nail plate bilaterally.  The most symptomatic area is the medial border.  He is concerned that there is a piece of nail growing into the toes.  Its very painful throughout the day equally to the big toes.  He presents for further treatment and evaluation  Past Medical History:  Diagnosis Date   Arthritis    Depression    Diabetes mellitus with complication ([place])    Diabetic neuropathy ([place])    Diastolic dysfunction    a. TTE 07/2017: EF 60-65%, mild concentric LVH, no RWMA, Gr1DD, trivial AI, mildly dilated LA, RVSF normal, PASP normal   Edema    feet/legs   GERD (gastroesophageal reflux disease)    Gout    History of stress test    a. MV 06/2017: small in size, mild in severity, apical anterior and apical defect that was minimally reversible and most likely represented artifact and less likely ischemia/scar, LVEF 55-65%, low risk, probably normal stress test   Hypertension    Hypothyroidism    Kidney cysts    renal failure 2013   Kidney stones    Dr. Rogers Blocker   OSA (obstructive sleep apnea)    supplemental oxygen at night   Oxygen dependent    hs   [facility] spotted fever 12/10/2017   Positve IgG in titer on 11/20/17   S/P cardiac cath 1998   a. no obstructive disease   Syncope and collapse    Temporary low platelet count (HCC)     Objective:  General: Well developed, nourished, in no acute distress, alert and oriented x3   Dermatology: Hyperkeratotic, discolored, thickened, onychodystrophy of the bilateral great toenails.  He does have history of partial nail matricectomy to the medial border bilateral.  There does appear to be evidence of an ingrowing nail.  No infection noted.  No erythema.. Skin is warm, dry and supple bilateral lower extremities. Negative for open lesions or macerations.  Vascular: Dorsalis  Pedis artery and Posterior Tibial artery pedal pulses palpable. No lower extremity edema noted.   Neruologic: Grossly intact via light touch bilateral.  Musculoskeletal: Muscular strength within normal limits in all groups bilateral. Normal range of motion noted to all pedal and ankle joints.  Associated tenderness to palpation along the medial and proximal portions of the nail plate bilateral great toes  Assessment:  1.  Ingrowing toenail bilateral great toes with nail dystrophy  Plan of Care:  1. Patient evaluated.  2. Discussed treatment alternatives and plan of care. Explained nail avulsion procedure and post procedure course to patient. 3. Patient opted for total temporary nail avulsion of the bilateral great toenails.  4. Prior to procedure, local anesthesia infiltration utilized using 3 ml of a 50:50 mixture of 2% plain lidocaine and 0.5% plain marcaine in a normal hallux block fashion and a betadine prep performed.  The nail was avulsed in toto in the standard manner to the bilateral great toes 5. Light dressing applied.  Silvadene cream provided 6. Return to clinic 2 weeks.   Edrick Kins, DPM [facility]  Dr. Edrick Kins, DPM    [address]  [place], [place] [address]                Office [phone]  Fax [phone]

## 2021-03-20 ENCOUNTER — Ambulatory Visit: Payer: Medicare Other | Admitting: Family Medicine

## 2021-03-27 ENCOUNTER — Other Ambulatory Visit: Payer: Self-pay

## 2021-03-27 ENCOUNTER — Ambulatory Visit (INDEPENDENT_AMBULATORY_CARE_PROVIDER_SITE_OTHER): Payer: Medicare Other | Admitting: Urology

## 2021-03-27 ENCOUNTER — Encounter: Payer: Self-pay | Admitting: Urology

## 2021-03-27 VITALS — BP 151/78 | HR 79 | Ht 71.0 in | Wt 251.0 lb

## 2021-03-27 DIAGNOSIS — N2889 Other specified disorders of kidney and ureter: Secondary | ICD-10-CM | POA: Diagnosis not present

## 2021-03-27 DIAGNOSIS — N401 Enlarged prostate with lower urinary tract symptoms: Secondary | ICD-10-CM | POA: Diagnosis not present

## 2021-03-27 NOTE — Progress Notes (Signed)
03/27/2021 1:46 PM   Douglas [name] [birth year] [id number]  Referring provider: [name], MD [address] [place],  [place] [address]  Chief Complaint  Patient presents with   Follow-up    Urologic history:  1. T1 renal mass -CT angio chest/abdomen/pelvis with incidental enhancing 2.8 x 2.7 x 2.4 cm left upper pole mass 01/2019 -MRI abdomen without contrast 03/13/2019 ordered by nephrology measured at 2.9 x 2.5 x 2.5 cm -Elected surveillance -RUS 07/2019 2.9 x 2.6 x 3.0   2.  BPH with lower urinary tract symptoms -Combination tx doxazosin/finasteride   3.  Erectile dysfunction   HPI: 75 y.o. male presents for follow-up visit.  Since his last visit he did note worsening voiding symptoms and Dr. [name] increased his doxazosin dose with improvement in his voiding pattern He was also started on TRT and has seen some improvement in his erections Denies flank, abdominal or pelvic pain No gross hematuria Noncontrast MRI 02/01/2021 without significant change of the left upper pole renal mass  PMH: Past Medical History:  Diagnosis Date   Arthritis    Depression    Diabetes mellitus with complication ([place])    Diabetic neuropathy ([place])    Diastolic dysfunction    a. TTE 07/2017: EF 60-65%, mild concentric LVH, no RWMA, Gr1DD, trivial AI, mildly dilated LA, RVSF normal, PASP normal   Edema    feet/legs   GERD (gastroesophageal reflux disease)    Gout    History of stress test    a. MV 06/2017: small in size, mild in severity, apical anterior and apical defect that was minimally reversible and most likely represented artifact and less likely ischemia/scar, LVEF 55-65%, low risk, probably normal stress test   Hypertension    Hypothyroidism    Kidney cysts    renal failure 2013   Kidney stones    Dr. Rogers Blocker   OSA (obstructive sleep apnea)    supplemental oxygen at night   Oxygen dependent    hs   [facility] spotted fever 12/10/2017    Positve IgG in titer on 11/20/17   S/P cardiac cath 1998   a. no obstructive disease   Syncope and collapse    Temporary low platelet count ([place])     Surgical History: Past Surgical History:  Procedure Laterality Date   ANAL FISSURE REPAIR     BACK SURGERY  1977   rupture disc lumbar spine   [facility] [place] [facility].    CATARACT EXTRACTION W/PHACO Right 07/16/2016   Procedure: CATARACT EXTRACTION PHACO AND INTRAOCULAR LENS PLACEMENT (IOC);  Surgeon: [name], MD;  Location: [facility] ORS;  Service: Ophthalmology;  Laterality: Right;  [place] 01:11 AP% 17.6 CDE 24.83 fluid pack lot # [id number]   CATARACT EXTRACTION W/PHACO Left 08/13/2016   Procedure: CATARACT EXTRACTION PHACO AND INTRAOCULAR LENS PLACEMENT ([place]) suture placed in left eye at end of procedure;  Surgeon: [name], MD;  Location: [facility] ORS;  Service: Ophthalmology;  Laterality: Left;  [place] 01:55 AP% 22.7 CDE 53.56 fluid pack lot # [id number]   FINGER AMPUTATION     partial   KNEE SURGERY  1993   arthroscopy   SHOULDER SURGERY Bilateral 1998   arthroscopic right, rotator cuff repair left    Home Medications:  Allergies as of 03/27/2021       Reactions   Bee Venom Swelling   Dye Fdc Red [red Dye] Swelling, Other (See Comments)  Reaction: gout   Omeprazole    Other reaction(s): increased sx on high dose   Atenolol Other (See Comments)   Did not regulate blood pressure   Benadryl [diphenhydramine Hcl (sleep)] Other (See Comments)   Hyperactivity   Enalapril Itching   itching   Gabapentin Palpitations, Rash   rash   Hctz [hydrochlorothiazide] Other (See Comments)   Decreased potassium        Medication List        Accurate as of March 27, 2021  1:46 PM. If you have any questions, ask your nurse or doctor.          acetaminophen 500 MG tablet Commonly known as: TYLENOL Take 500 mg by mouth every 6 (six) hours as needed.   allopurinol  300 MG tablet Commonly known as: ZYLOPRIM TAKE 1 TABLET(300 MG) BY MOUTH DAILY   aspirin 81 MG tablet Take 1 tablet (81 mg total) by mouth daily.   BD Pen Needle Nano 2nd Gen 32G X 4 MM Misc Generic drug: Insulin Pen Needle USE DAILY   calcitRIOL 0.25 MCG capsule Commonly known as: ROCALTROL Take 0.25 mcg by mouth daily.   carvedilol 6.25 MG tablet Commonly known as: COREG Take 6.25 mg by mouth 2 (two) times daily with a meal.   clove oil liquid Apply 1 application topically as needed.   cyclobenzaprine 10 MG tablet Commonly known as: FLEXERIL Take 1 tablet by mouth as needed.   dapagliflozin propanediol 10 MG Tabs tablet Commonly known as: [name] Take 1 tablet (10 mg total) by mouth daily.   doxazosin 4 MG tablet Commonly known as: CARDURA Take 1 tablet (4 mg total) by mouth daily.   DULoxetine 60 MG capsule Commonly known as: CYMBALTA TAKE 1 CAPSULE(60 MG) BY MOUTH DAILY   esomeprazole 40 MG capsule Commonly known as: NexIUM Take 1 capsule (40 mg total) by mouth daily at 12 noon.   finasteride 5 MG tablet Commonly known as: PROSCAR TAKE 1 TABLET(5 MG) BY MOUTH AT BEDTIME   fluticasone 50 MCG/ACT nasal spray Commonly known as: FLONASE Place 2 sprays into both nostrils daily.   furosemide 40 MG tablet Commonly known as: LASIX TAKE 1 TABLET(40 MG) BY MOUTH DAILY   HYDROcodone-acetaminophen 5-325 MG tablet Commonly known as: NORCO/VICODIN Take 1 tablet by mouth every 6 (six) hours as needed for moderate pain.   levothyroxine 100 MCG tablet Commonly known as: SYNTHROID TAKE 1 TABLET(100 MCG) BY MOUTH DAILY BEFORE BREAKFAST   loratadine 10 MG tablet Commonly known as: CLARITIN Take 10 mg by mouth daily as needed for allergies.   losartan 25 MG tablet Commonly known as: COZAAR TAKE 1 TABLET BY MOUTH EVERY MORNING THEN TAKE 2 TABLETS BY MOUTH EVERY EVENING   meloxicam 15 MG tablet Commonly known as: MOBIC Take 1 tablet (15 mg total) by mouth daily.    metFORMIN 500 MG 24 hr tablet Commonly known as: GLUCOPHAGE-XR Take 2 tablets (1,000 mg total) by mouth 2 (two) times daily.   MULTIVITAMIN MEN PO Take 1 tablet by mouth daily.   Ozempic (1 MG/DOSE) 4 MG/3ML Sopn Generic drug: Semaglutide (1 MG/DOSE) Inject 1 mg into the skin once a week.   pregabalin 200 MG capsule Commonly known as: LYRICA Take 1 capsule (200 mg total) by mouth 2 (two) times daily.   simvastatin 40 MG tablet Commonly known as: ZOCOR TAKE 1 TABLET(40 MG) BY MOUTH AT BEDTIME   tadalafil 20 MG tablet Commonly known as: CIALIS Take by mouth.  testosterone cypionate 200 MG/ML injection Commonly known as: DEPOTESTOSTERONE CYPIONATE Inject 200 mg into the muscle every 14 (fourteen) days.   Tyler Aas FlexTouch 200 UNIT/ML FlexTouch Pen Generic drug: insulin degludec Inject 44 Units into the skin daily.        Allergies:  Allergies  Allergen Reactions   Bee Venom Swelling   Dye Fdc Red [Red Dye] Swelling and Other (See Comments)    Reaction: gout   Omeprazole     Other reaction(s): increased sx on high dose   Atenolol Other (See Comments)    Did not regulate blood pressure   Benadryl [Diphenhydramine Hcl (Sleep)] Other (See Comments)    Hyperactivity    Enalapril Itching    itching   Gabapentin Palpitations and Rash    rash   Hctz [Hydrochlorothiazide] Other (See Comments)    Decreased potassium    Family History: Family History  Problem Relation Age of Onset   Breast cancer Mother    Lung cancer Mother    Bone cancer Mother    Heart Problems Mother    Cancer Mother        breast, lung and rib   AAA (abdominal aortic aneurysm) Mother    Heart disease Mother    Heart attack Father    Heart disease Father    Cancer Brother        esophageal   Heart attack Brother    Colon cancer Neg Hx     Social History:  reports that he has never smoked. He has never used smokeless tobacco. He reports that he does not drink alcohol and does not use  drugs.   Physical Exam: BP (!) 151/78    Pulse 79    Ht 5\' 11"  (1.803 m)    Wt 251 lb (113.9 kg)    BMI 35.01 kg/m   Constitutional:  Alert and oriented, No acute distress. HEENT: [place] AT, moist mucus membranes.  Trachea midline, no masses. Cardiovascular: No clubbing, cyanosis, or edema. Respiratory: Normal respiratory effort, no increased work of breathing. GI: Abdomen is soft, nontender, nondistended, no abdominal masses Psychiatric: Normal mood and affect.   Pertinent Imaging: MR images were personally reviewed and interpreted.  They were not measured.  On my review measures 2.1 x 2.4 x 2.4 cm  Assessment & Plan:    1.  Left renal mass Stable Based on his current renal function he would be a candidate for renal mass protocol MR.   55-month follow-up with repeat imaging  2.  BPH with LUTS Recent titration of doxazosin with improvement in voiding pattern Continue combination therapy   Abbie Sons, MD  [facility] [address] [place], [place] [address] [phone]

## 2021-03-30 ENCOUNTER — Other Ambulatory Visit: Payer: Self-pay | Admitting: Urology

## 2021-03-30 ENCOUNTER — Encounter: Payer: Self-pay | Admitting: Urology

## 2021-03-30 DIAGNOSIS — N2889 Other specified disorders of kidney and ureter: Secondary | ICD-10-CM

## 2021-04-02 ENCOUNTER — Other Ambulatory Visit: Payer: Self-pay

## 2021-04-02 ENCOUNTER — Ambulatory Visit (INDEPENDENT_AMBULATORY_CARE_PROVIDER_SITE_OTHER): Payer: Medicare Other | Admitting: Podiatry

## 2021-04-02 DIAGNOSIS — L600 Ingrowing nail: Secondary | ICD-10-CM

## 2021-04-02 MED ORDER — DOXYCYCLINE HYCLATE 100 MG PO TABS: 100.0000 mg | ORAL_TABLET | Freq: Two times a day (BID) | ORAL | 0 refills | Status: DC

## 2021-04-02 NOTE — Progress Notes (Signed)
HPI: 75 y.o. male presenting today for follow-up evaluation of total permanent nail avulsions to the bilateral great toes performed on 03/19/2021.  Patient states that he is doing well.  The nail beds are slowly healing.  He did some soakings and he is overall doing well.  The right great toe remains somewhat red so he says.  He does experience some tenderness to the base of the left hallux extending proximally.  He presents for further treatment evaluation  Past Medical History:  Diagnosis Date   Arthritis    Depression    Diabetes mellitus with complication ([place])    Diabetic neuropathy ([place])    Diastolic dysfunction    a. TTE 07/2017: EF 60-65%, mild concentric LVH, no RWMA, Gr1DD, trivial AI, mildly dilated LA, RVSF normal, PASP normal   Edema    feet/legs   GERD (gastroesophageal reflux disease)    Gout    History of stress test    a. MV 06/2017: small in size, mild in severity, apical anterior and apical defect that was minimally reversible and most likely represented artifact and less likely ischemia/scar, LVEF 55-65%, low risk, probably normal stress test   Hypertension    Hypothyroidism    Kidney cysts    renal failure 2013   Kidney stones    Dr. Rogers Blocker   OSA (obstructive sleep apnea)    supplemental oxygen at night   Oxygen dependent    hs   [facility] spotted fever 12/10/2017   Positve IgG in titer on 11/20/17   S/P cardiac cath 1998   a. no obstructive disease   Syncope and collapse    Temporary low platelet count ([place])     Past Surgical History:  Procedure Laterality Date   ANAL FISSURE REPAIR     BACK SURGERY  1977   rupture disc lumbar spine   [facility] [place] [facility].    CATARACT EXTRACTION W/PHACO Right 07/16/2016   Procedure: CATARACT EXTRACTION PHACO AND INTRAOCULAR LENS PLACEMENT (IOC);  Surgeon: [name], MD;  Location: [facility] ORS;  Service: Ophthalmology;  Laterality: Right;  [place] 01:11 AP%  17.6 CDE 24.83 fluid pack lot # [id number]   CATARACT EXTRACTION W/PHACO Left 08/13/2016   Procedure: CATARACT EXTRACTION PHACO AND INTRAOCULAR LENS PLACEMENT ([place]) suture placed in left eye at end of procedure;  Surgeon: [name], MD;  Location: [facility] ORS;  Service: Ophthalmology;  Laterality: Left;  [place] 01:55 AP% 22.7 CDE 53.56 fluid pack lot # [id number]   FINGER AMPUTATION     partial   KNEE SURGERY  1993   arthroscopy   SHOULDER SURGERY Bilateral 1998   arthroscopic right, rotator cuff repair left    Allergies  Allergen Reactions   Bee Venom Swelling   Dye Fdc Red [Red Dye] Swelling and Other (See Comments)    Reaction: gout   Omeprazole     Other reaction(s): increased sx on high dose   Atenolol Other (See Comments)    Did not regulate blood pressure   Benadryl [Diphenhydramine Hcl (Sleep)] Other (See Comments)    Hyperactivity    Enalapril Itching    itching   Gabapentin Palpitations and Rash    rash   Hctz [Hydrochlorothiazide] Other (See Comments)    Decreased potassium     Physical Exam: General: The patient is alert and oriented x3 in no acute distress.  Dermatology: Absence of the nail plates noted bilateral great toes.  There is some  erythema and edema around the base of the right hallux nail bed.  No purulent drainage noted to either toe.  Left great toe appears healthy with good routine healing.  Vascular: Palpable pedal pulses bilaterally. Capillary refill within normal limits.  Negative for any significant edema or erythema  Neurological: Light touch and protective threshold grossly intact  Musculoskeletal Exam: No pedal deformities   Assessment: 1.  S/P total temporary nail avulsions bilateral great toenails   Plan of Care:  1. Patient evaluated.  2.  To address the redness with slight swelling to the right hallux nail plate we will prescribe some doxycycline 100 mg 2 times daily #20 3.  Continue Silvadene cream daily with a light Band-Aid 4.   Continue daily Epsom salt soaks 5.  Return to clinic in 3 weeks      Edrick Kins, DPM [facility]  Dr. Edrick Kins, DPM    [address] [place], [place] [address]                Office [phone]  Fax [phone]

## 2021-04-05 ENCOUNTER — Other Ambulatory Visit: Payer: Self-pay | Admitting: Family Medicine

## 2021-04-17 ENCOUNTER — Telehealth: Payer: Self-pay | Admitting: Pharmacy Technician

## 2021-04-17 DIAGNOSIS — Z596 Low income: Secondary | ICD-10-CM

## 2021-04-17 NOTE — Progress Notes (Signed)
[facility])                                            [place] Team    04/17/2021  Syre Knerr Gesner 09/06/1945 [id number]  Received both patient and provider portion(s) of patient assistance application(s) for [place] and Ozempic. Faxed completed application and required documents into [employer].   Halil Rentz P. Reshaun Briseno, [place]  [phone]

## 2021-04-22 ENCOUNTER — Telehealth: Payer: Self-pay | Admitting: Pharmacy Technician

## 2021-04-22 DIAGNOSIS — Z596 Low income: Secondary | ICD-10-CM

## 2021-04-22 NOTE — Progress Notes (Addendum)
[facility])                                            [place] Team    04/22/2021  Douglas [name] 02-Jul-1945 [id number]  Care coordination call placed to [name] in regard to  [facility] application.  Spoke to [place] who informs patient is APPROVED 04/14/21-04/13/22. She informs a new rx is on file and that next refill will process based on last fill date in 2022 automatically. She informs once processed the medication will be delivered to the patient's home.  Douglas [name] [name], [place]  [phone]

## 2021-04-26 ENCOUNTER — Encounter: Payer: Self-pay | Admitting: Podiatry

## 2021-04-26 ENCOUNTER — Other Ambulatory Visit: Payer: Self-pay

## 2021-04-26 ENCOUNTER — Ambulatory Visit: Payer: Medicare Other | Admitting: Podiatry

## 2021-04-26 ENCOUNTER — Ambulatory Visit (INDEPENDENT_AMBULATORY_CARE_PROVIDER_SITE_OTHER): Payer: Medicare Other | Admitting: Podiatry

## 2021-04-26 DIAGNOSIS — L600 Ingrowing nail: Secondary | ICD-10-CM

## 2021-04-26 DIAGNOSIS — M7752 Other enthesopathy of left foot: Secondary | ICD-10-CM

## 2021-04-26 MED ORDER — BETAMETHASONE SOD PHOS & ACET 6 (3-3) MG/ML IJ SUSP
3.0000 mg | Freq: Once | INTRAMUSCULAR | Status: AC
  Administered 2021-04-26: 3 mg via INTRA_ARTICULAR

## 2021-04-26 NOTE — Progress Notes (Signed)
HPI: 76 y.o. male presenting today for follow-up evaluation of total permanent nail avulsions to bilateral great toes performed on 03/20/2019.  Patient states that he is feeling much better.  He continues to have some pain and tenderness to the left great toe proximal to the nail plate.  He says the right great toe is doing very well.  He presents for further treatment and evaluation  Past Medical History:  Diagnosis Date   Arthritis    Depression    Diabetes mellitus with complication ([place])    Diabetic neuropathy ([place])    Diastolic dysfunction    a. TTE 07/2017: EF 60-65%, mild concentric LVH, no RWMA, Gr1DD, trivial AI, mildly dilated LA, RVSF normal, PASP normal   Edema    feet/legs   GERD (gastroesophageal reflux disease)    Gout    History of stress test    a. MV 06/2017: small in size, mild in severity, apical anterior and apical defect that was minimally reversible and most likely represented artifact and less likely ischemia/scar, LVEF 55-65%, low risk, probably normal stress test   Hypertension    Hypothyroidism    Kidney cysts    renal failure 2013   Kidney stones    Dr. Rogers Blocker   OSA (obstructive sleep apnea)    supplemental oxygen at night   Oxygen dependent    hs   [facility] spotted fever 12/10/2017   Positve IgG in titer on 11/20/17   S/P cardiac cath 1998   a. no obstructive disease   Syncope and collapse    Temporary low platelet count ([place])     Past Surgical History:  Procedure Laterality Date   ANAL FISSURE REPAIR     BACK SURGERY  1977   rupture disc lumbar spine   [facility] [place] [facility].    CATARACT EXTRACTION W/PHACO Right 07/16/2016   Procedure: CATARACT EXTRACTION PHACO AND INTRAOCULAR LENS PLACEMENT (IOC);  Surgeon: [name], MD;  Location: [facility] ORS;  Service: Ophthalmology;  Laterality: Right;  [place] 01:11 AP% 17.6 CDE 24.83 fluid pack lot # [id number]   CATARACT EXTRACTION W/PHACO  Left 08/13/2016   Procedure: CATARACT EXTRACTION PHACO AND INTRAOCULAR LENS PLACEMENT ([place]) suture placed in left eye at end of procedure;  Surgeon: [name], MD;  Location: [facility] ORS;  Service: Ophthalmology;  Laterality: Left;  [place] 01:55 AP% 22.7 CDE 53.56 fluid pack lot # [id number]   FINGER AMPUTATION     partial   KNEE SURGERY  1993   arthroscopy   SHOULDER SURGERY Bilateral 1998   arthroscopic right, rotator cuff repair left    Allergies  Allergen Reactions   Bee Venom Swelling   Dye Fdc Red [Red Dye] Swelling and Other (See Comments)    Reaction: gout   Omeprazole     Other reaction(s): increased sx on high dose   Atenolol Other (See Comments)    Did not regulate blood pressure   Benadryl [Diphenhydramine Hcl (Sleep)] Other (See Comments)    Hyperactivity    Enalapril Itching    itching   Gabapentin Palpitations and Rash    rash   Hctz [Hydrochlorothiazide] Other (See Comments)    Decreased potassium     Physical Exam: General: The patient is alert and oriented x3 in no acute distress.  Dermatology: Skin is warm, dry and supple bilateral lower extremities. Negative for open lesions or macerations.  Absence of the nail plates noted to the bilateral great  toes.  The nailbed is somewhat callused and healing.  There is only a small area of granular tissue to the nailbed of the right hallux.  Vascular: Palpable pedal pulses bilaterally. Capillary refill within normal limits.  Negative for any significant edema or erythema  Neurological: Light touch and protective threshold grossly intact  Musculoskeletal Exam: No pedal deformities noted.  There is pain on palpation to the IPJ of the left hallux proximal to the nail bed.  Assessment: 1.  Status post total permanent nail avulsions bilateral great toes.  03/19/2021 2.  Capsulitis left hallux IPJ   Plan of Care:  1. Patient evaluated.  2.  Light debridement of the nailbeds was performed using a tissue nipper.   Continue antibiotic cream and a Band-Aid daily until healed 3.  Decision was made today to administer 0.5 cc Celestone Soluspan injected into the IPJ left hallux to see if this helps alleviate some the patient's pain 4.  Continue wearing good shoes supportive shoes  5.  Return to clinic as needed      Edrick Kins, DPM [facility]  Dr. Edrick Kins, DPM    [address] [place], [place] [address]                Office [phone]  Fax [phone]

## 2021-04-30 ENCOUNTER — Ambulatory Visit: Payer: Medicare Other | Admitting: Podiatry

## 2021-05-03 ENCOUNTER — Telehealth: Payer: Self-pay | Admitting: Pharmacy Technician

## 2021-05-03 DIAGNOSIS — Z596 Low income: Secondary | ICD-10-CM

## 2021-05-03 NOTE — Progress Notes (Addendum)
[place] [facility])                                            [place] Team    05/03/2021  Douglas [name] May 06, 1945 [id number]  Care coordination call placed to [employer] in regard to [name].  Spoke to [place] who informs patient was APPROVED 04/14/21-04/13/22. Refills will be processed automatically based on last fill date in 2022 and going forward with delivery to the provider's office.   [place] Madding P. Channing Savich, [place]  [phone]

## 2021-05-07 ENCOUNTER — Other Ambulatory Visit: Payer: Self-pay | Admitting: Family Medicine

## 2021-05-08 DIAGNOSIS — Z20822 Contact with and (suspected) exposure to covid-19: Secondary | ICD-10-CM | POA: Diagnosis not present

## 2021-05-10 DIAGNOSIS — Z20822 Contact with and (suspected) exposure to covid-19: Secondary | ICD-10-CM | POA: Diagnosis not present

## 2021-05-13 ENCOUNTER — Ambulatory Visit (INDEPENDENT_AMBULATORY_CARE_PROVIDER_SITE_OTHER): Payer: Medicare Other | Admitting: Pharmacist

## 2021-05-13 DIAGNOSIS — E1142 Type 2 diabetes mellitus with diabetic polyneuropathy: Secondary | ICD-10-CM

## 2021-05-13 DIAGNOSIS — N1831 Chronic kidney disease, stage 3a: Secondary | ICD-10-CM

## 2021-05-13 DIAGNOSIS — I10 Essential (primary) hypertension: Secondary | ICD-10-CM

## 2021-05-13 NOTE — Chronic Care Management (AMB) (Signed)
Chronic Care Management CCM Pharmacy Note  05/13/2021 Name:  Douglas [name] MRN:  [id number] DOB:  [birth year]  Summary: - Tolerating current regimen well. Approved for patient assistance   Recommendations/Changes made from today's visit: - Recommended to continue current regimen at this time - Reviewed vaccination and screening recommendations  Subjective: Douglas [name] is an 76 y.o. year old male who is a primary patient of [name], [name], MD.  The CCM team was consulted for assistance with disease management and care coordination needs.    Engaged with patient by telephone for follow up visit for pharmacy case management and/or care coordination services.   Objective:  Medications Reviewed Today     Reviewed by De Hollingshead, RPH-CPP (Pharmacist) on 05/13/21 at [place] List Status: <None>   Medication Order Taking? Sig Documenting Provider Last Dose Status Informant  acetaminophen (TYLENOL) 500 MG tablet [id number] Yes Take 500 mg by mouth every 6 (six) hours as needed. [provider] Taking Active   allopurinol (ZYLOPRIM) 300 MG tablet [id number] Yes TAKE 1 TABLET(300 MG) BY MOUTH DAILY [name], MD Taking Active   aspirin 81 MG tablet [id number] Yes Take 1 tablet (81 mg total) by mouth daily. Wellington Hampshire, MD Taking Active Self  calcitRIOL (ROCALTROL) 0.25 MCG capsule [id number] Yes Take 0.25 mcg by mouth daily.  [provider] Taking Active Self  carvedilol (COREG) 6.25 MG tablet [id number] Yes TAKE 1 TABLET(6.25 MG) BY MOUTH TWICE DAILY WITH A MEAL [name], MD Taking Active   clove oil liquid [id number] Yes Apply 1 application topically as needed. [provider] Taking Active   cyclobenzaprine (FLEXERIL) 10 MG tablet [id number] Yes Take 1 tablet by mouth as needed. [provider] Taking Active   dapagliflozin propanediol ([name]) 10 MG TABS tablet [id number] Yes Take 1 tablet (10 mg total) by  mouth daily. [name], MD Taking Active   doxazosin (CARDURA) 4 MG tablet [id number]  Take 1 tablet (4 mg total) by mouth daily. [name], MD  Active   DULoxetine (CYMBALTA) 60 MG capsule [id number] Yes TAKE 1 CAPSULE(60 MG) BY MOUTH DAILY [name], MD Taking Active   esomeprazole (NEXIUM) 40 MG capsule [id number] Yes Take 1 capsule (40 mg total) by mouth daily at 12 noon. Jackolyn Confer, MD Taking Active   finasteride (PROSCAR) 5 MG tablet [id number] Yes TAKE 1 TABLET(5 MG) BY MOUTH AT BEDTIME Stoioff, Ronda Fairly, MD Taking Active   fluticasone (FLONASE) 50 MCG/ACT nasal spray [id number]  Place 2 sprays into both nostrils daily. [name], MD  Active   furosemide (LASIX) 40 MG tablet [id number] Yes TAKE 1 TABLET(40 MG) BY MOUTH DAILY [name], MD Taking Active   HYDROcodone-acetaminophen (NORCO/VICODIN) 5-325 MG tablet [id number] Yes Take 1 tablet by mouth every 6 (six) hours as needed for moderate pain. [name], MD Taking Active   insulin degludec ([name] FLEXTOUCH) 200 UNIT/ML FlexTouch Pen [id number] Yes Inject 44 Units into the skin daily. [name], MD Taking Active   levothyroxine (SYNTHROID) 100 MCG tablet [id number] Yes TAKE 1 TABLET(100 MCG) BY MOUTH DAILY BEFORE BREAKFAST [name], MD Taking Active   loratadine (CLARITIN) 10 MG tablet [id number] Yes Take 10 mg by mouth daily as needed for allergies. [provider] Taking Active   losartan (COZAAR) 25 MG tablet [id number] Yes TAKE 1 TABLET BY MOUTH EVERY MORNING THEN TAKE 2 TABLETS BY MOUTH EVERY EVENING [name]  G, MD Taking Active   meloxicam (MOBIC) 15 MG tablet [id number] Yes Take 1 tablet (15 mg total) by mouth daily. Edrick Kins, DPM Taking Active   metFORMIN (GLUCOPHAGE-XR) 500 MG 24 hr tablet [id number] Yes Take 2 tablets (1,000 mg total) by mouth 2 (two) times daily. [name], MD Taking Active   Multiple Vitamins-Minerals (MULTIVITAMIN  MEN PO) [id number] Yes Take 1 tablet by mouth daily. [provider] Taking Active   pregabalin (LYRICA) 200 MG capsule [id number] Yes Take 1 capsule (200 mg total) by mouth 2 (two) times daily. [name], MD Taking Active   Semaglutide, 1 MG/DOSE, (OZEMPIC, 1 MG/DOSE,) 4 MG/3ML SOPN [id number] Yes Inject 1 mg into the skin once a week. [name], MD Taking Active   simvastatin (ZOCOR) 40 MG tablet [id number] Yes TAKE 1 TABLET(40 MG) BY MOUTH AT BEDTIME [name], MD Taking Active   tadalafil (CIALIS) 20 MG tablet [id number] Yes Take by mouth. [provider] Taking Active   testosterone cypionate (DEPOTESTOSTERONE CYPIONATE) 200 MG/ML injection [id number] Yes Inject 200 mg into the muscle every 14 (fourteen) days. [provider] Taking Active             Pertinent Labs:   Lab Results  Component Value Date   HGBA1C 6.6 (H) 12/19/2020   Lab Results  Component Value Date   CHOL 101 12/19/2020   HDL 29.70 (L) 12/19/2020   LDLCALC 40 11/23/2019   LDLDIRECT 40.0 12/19/2020   TRIG 229.0 (H) 12/19/2020   CHOLHDL 3 12/19/2020   Lab Results  Component Value Date   CREATININE 1.31 12/19/2020   BUN 11 12/19/2020   NA 137 12/19/2020   K 4.3 12/19/2020   CL 100 12/19/2020   CO2 29 12/19/2020    SDOH:  (Social Determinants of Health) assessments and interventions performed:  SDOH Interventions    Flowsheet Row Most Recent Value  SDOH Interventions   Financial Strain Interventions Other (Comment)  [manufacturer assistance]       CCM Care Plan  Review of patient past medical history, allergies, medications, health status, including review of consultants reports, laboratory and other test data, was performed as part of comprehensive evaluation and provision of chronic care management services.   Care Plan : Medication Management  Updates made by De Hollingshead, RPH-CPP since 05/13/2021 12:00 AM     Problem: Diabetes,  Hypertension      Long-Range Goal: Disease Progression Prevention   Start Date: 01/11/2020  Recent Progress: On track  Priority: High  Note:   Current Barriers:  Unable to independently afford treatment regimen Unable to achieve control of diabetes   Pharmacist Clinical Goal(s):  Over the next 90 days, patient will verbalize ability to afford treatment regimen. Over the next 90 days, patient will achieve control of diabetes as evidenced by improvement in A1c through collaboration with PharmD and provider.   Interventions: 1:1 collaboration with [name], MD regarding development and update of comprehensive plan of care as evidenced by provider attestation and co-signature Inter-disciplinary care team collaboration (see longitudinal plan of care) Comprehensive medication review performed; medication list updated in electronic medical record  Health Maintenance   Yearly diabetic eye exam: due - reports he had his last eye exam in December, he will call them to ask to send records to [place] Yearly diabetic foot exam: up to date Urine microalbumin: up to date Yearly influenza vaccination: up to date Td/Tdap vaccination: due - discussed, encouraged to pursue  at pharmacy Pneumonia vaccination: up to date COVID vaccinations: up to date Shingrix vaccinations: due - discussed, encouraged to pursue at pharmacy Colonoscopy: up to date  Diabetes: Controlled per A1c; current treatment: Ozempic 1 mg weekly, metformin XR 1000 mg BID, [name] 44 units daily, [name] 10 mg daily Approved for patient assistance for Ozempic, [name], and [name] through 2023 Reviewed refill procedure for [name] and [employer]. Reviewed shipping information  Follow up with PCP tomorrow for lab work.  Hypertension: Controlled per home readings; current treatment: losartan 25 mg QAM, 50 mg QPM, doxazosin 4 mg QPM, carvedilol 6.25 mg BID; furosemide 40 mg QAM Afternoon dizziness/sleepiness w/ losartan 50 mg  dose in the morning Hx hypokalemia w/ HCTZ Itching w/ enalapril Reported lack of benefit w/ generic atenolol No hx spironolactone.  Previously recommended to continue current regimen. Follow up with PCP tomorrow  Hyperlipidemia: Controlled per last lab work; current treatment: simvastatin 40 mg daily Recommended to continue current regimen at this time  Peripheral Neuropathy, Chronic Back Pain: Improved; working with podiatry current treatment: duloxetine 60 mg daily, pregabalin 200 mg BID, hydocodone-APAP 5/325 mg PRN (reports that a prescription for 30 tablets will last about a year), topical clove oil and Salonpas; PT for mobility support; cyclobenzaprine 10 mg PRN Reports improvement in toe pain after removal of the toe nails Recommended to continue current regimen.   CKD: Stable. Current treatment: calcitriol 0.25 mg daily, ([name] 10 mg) Recommended to continue current regimen and collaboration w/ nephrology at this time  BPH:  Controlled per patient report; current regimen: finasteride 5 mg daily, doxazosin 2 mg daily; follows w/ urology Hx tamsulosin, dizziness Hx dizziness with doxazosin 4 mg daily Recommended to continue current regimen at this time  Gout: Controlled; current treatment: allopurinol 300 mg daily Previously recommended to continue current regimen at this time.  Patient Goals/Self-Care Activities Over the next 90 days, patient will:  - take medications as prescribed check glucose three times daily using CGM, document, and provide at future appointments check blood pressure daily, document, and provide at future appointments collaborate with provider on medication access solutions      Plan: Telephone follow up appointment with care management team member scheduled for:  4 months  Catie Darnelle Maffucci, PharmD, [place], [place] Pharmacist [employer] at [employer] [phone]

## 2021-05-13 NOTE — Patient Instructions (Addendum)
Alben,   It was great talking to you today! Keep up the great work!  We recommend the Shingrix (shingles) vaccine series for all over age 76. We also recommend the Tdap (tetanus, diptheria, and pertussis booster) every 10 years. Both should have a $0 copay on all Medicare plans this year. You can pursue this without a prescription at your local pharmacy, or feel free to call our [place] at [facility] at [phone].  Please call your eye doctor and have them send the results from your most recent diabetic eye exam to our office.   The [employer] Patient Assistance Program (for [place], [place]) has an auto-refill program where you do not need to call and request a refill each time. You should receive your medication shipment before you run out of your medication. However, if you have any issues or need assistance, you can call them at [phone]. They are available Monday though Friday 8:00 AM - 8:00 PM.   For prescription refills through the [name] Prescription Savings Program (for [place]) call [phone]. Once you have been enrolled in the program, your prescriptions can easily be refilled by contacting the phone number above Monday though Friday 9:00 AM - 6:00 PM, though they should have you enrolled in automatic refills.   Take care!  Catie Darnelle Maffucci, PharmD  Visit Information  Following are the goals we discussed today:  Patient Goals/Self-Care Activities Over the next 90 days, patient will:  - take medications as prescribed check glucose three times daily using CGM, document, and provide at future appointments check blood pressure daily, document, and provide at future appointments collaborate with provider on medication access solutions        Plan: Telephone follow up appointment with care management team member scheduled for:  4 months   Catie Darnelle Maffucci, PharmD, [place], CPP Clinical Pharmacist [place] at [facility] [phone]   Please  call the care guide team at [phone] if you need to cancel or reschedule your appointment.   The patient verbalized understanding of instructions, educational materials, and care plan provided today and agreed to receive a mailed copy of patient instructions, educational materials, and care plan.

## 2021-05-14 ENCOUNTER — Encounter: Payer: Self-pay | Admitting: Family Medicine

## 2021-05-14 ENCOUNTER — Other Ambulatory Visit: Payer: Self-pay

## 2021-05-14 ENCOUNTER — Ambulatory Visit (INDEPENDENT_AMBULATORY_CARE_PROVIDER_SITE_OTHER): Payer: Medicare Other | Admitting: Family Medicine

## 2021-05-14 VITALS — BP 130/80 | HR 78 | Temp 98.2°F | Ht 71.0 in | Wt 254.2 lb

## 2021-05-14 DIAGNOSIS — J309 Allergic rhinitis, unspecified: Secondary | ICD-10-CM

## 2021-05-14 DIAGNOSIS — I10 Essential (primary) hypertension: Secondary | ICD-10-CM

## 2021-05-14 DIAGNOSIS — E669 Obesity, unspecified: Secondary | ICD-10-CM

## 2021-05-14 DIAGNOSIS — E039 Hypothyroidism, unspecified: Secondary | ICD-10-CM

## 2021-05-14 DIAGNOSIS — E785 Hyperlipidemia, unspecified: Secondary | ICD-10-CM

## 2021-05-14 DIAGNOSIS — E1142 Type 2 diabetes mellitus with diabetic polyneuropathy: Secondary | ICD-10-CM

## 2021-05-14 DIAGNOSIS — N1831 Chronic kidney disease, stage 3a: Secondary | ICD-10-CM

## 2021-05-14 LAB — TSH: TSH: 1.19 u[IU]/mL (ref 0.35–5.50)

## 2021-05-14 LAB — HEMOGLOBIN A1C: Hgb A1c MFr Bld: 6.5 % (ref 4.6–6.5)

## 2021-05-14 NOTE — Progress Notes (Signed)
Douglas Rumps, MD Phone: [phone]  Douglas [name] is a 76 y.o. male who presents today for f/u.  HYPERTENSION Disease Monitoring Home BP Monitoring 130s/70s Chest pain- no    Dyspnea- no Medications Compliance-  taking losartan, cardura, carvedilol. Lightheadedness-  rare when standing up  Edema- no BMET    Component Value Date/Time   NA 137 12/19/2020 1156   NA 140 11/12/2018 1142   NA 142 12/04/2012 0414   K 4.3 12/19/2020 1156   K 4.4 12/04/2012 0414   CL 100 12/19/2020 1156   CL 110 (H) 12/04/2012 0414   CO2 29 12/19/2020 1156   CO2 26 12/04/2012 0414   GLUCOSE 92 12/19/2020 1156   GLUCOSE 116 (H) 12/04/2012 0414   BUN 11 12/19/2020 1156   BUN 13 11/12/2018 1142   BUN 32 (H) 12/04/2012 0414   CREATININE 1.31 12/19/2020 1156   CREATININE 1.30 (H) 11/17/2016 1012   CALCIUM 8.9 12/19/2020 1156   CALCIUM 9.1 12/04/2012 0414   GFRNONAA 46 (L) 01/19/2019 1920   GFRNONAA 28 (L) 12/04/2012 0414   GFRAA 53 (L) 01/19/2019 1920   GFRAA 32 (L) 12/04/2012 0414   DIABETES Disease Monitoring: Blood Sugar ranges-using his libre, 127 is his 7 day average Polyuria/phagia/dipsia- some polydipsia      Optho- due, the eye doctor had to reschedule Medications: Compliance- taking tresiba 44 u daily, metformin, farxiga, ozempic Hypoglycemic symptoms- no  HYPOTHYROIDISM Disease Monitoring Weight changes: some purposeful weight loss  Skin Changes: no Heat/Cold intolerance: no  Medication Monitoring Compliance:  taking synthroid   Last TSH:   Lab Results  Component Value Date   TSH 1.72 03/02/2020   Obesity: Patient notes he is eating less sweets and eating more vegetables.  He is getting more activity.  Notes his weight has trended down.  Allergic rhinitis: Patient notes some postnasal drip and occasional phlegm in his throat.  No sneezing or rhinorrhea.  He uses Flonase with generally good benefit.  Notes this all worsened after he had COVID.  Foot pain: Patient has  seen podiatry.  He received injections which were somewhat beneficial.  Notes some discomfort at the corner of his toes at this time.  Occasionally has to take hydrocodone for this.   Social History   Tobacco Use  Smoking Status Never  Smokeless Tobacco Never    Current Outpatient Medications on File Prior to Visit  Medication Sig Dispense Refill   acetaminophen (TYLENOL) 500 MG tablet Take 500 mg by mouth every 6 (six) hours as needed.     allopurinol (ZYLOPRIM) 300 MG tablet TAKE 1 TABLET(300 MG) BY MOUTH DAILY 90 tablet 1   aspirin 81 MG tablet Take 1 tablet (81 mg total) by mouth daily.     calcitRIOL (ROCALTROL) 0.25 MCG capsule Take 0.25 mcg by mouth daily.   5   carvedilol (COREG) 6.25 MG tablet TAKE 1 TABLET(6.25 MG) BY MOUTH TWICE DAILY WITH A MEAL 180 tablet 0   clove oil liquid Apply 1 application topically as needed.     cyclobenzaprine (FLEXERIL) 10 MG tablet Take 1 tablet by mouth as needed.     dapagliflozin propanediol ([name]) 10 MG TABS tablet Take 1 tablet (10 mg total) by mouth daily. 90 tablet 3   doxazosin (CARDURA) 4 MG tablet Take 1 tablet (4 mg total) by mouth daily. 90 tablet 1   DULoxetine (CYMBALTA) 60 MG capsule TAKE 1 CAPSULE(60 MG) BY MOUTH DAILY 90 capsule 1   esomeprazole (NEXIUM) 40 MG capsule  Take 1 capsule (40 mg total) by mouth daily at 12 noon. 90 capsule 3   finasteride (PROSCAR) 5 MG tablet TAKE 1 TABLET(5 MG) BY MOUTH AT BEDTIME 90 tablet 3   fluticasone (FLONASE) 50 MCG/ACT nasal spray Place 2 sprays into both nostrils daily. 16 g 6   furosemide (LASIX) 40 MG tablet TAKE 1 TABLET(40 MG) BY MOUTH DAILY 90 tablet 1   HYDROcodone-acetaminophen (NORCO/VICODIN) 5-325 MG tablet Take 1 tablet by mouth every 6 (six) hours as needed for moderate pain. 30 tablet 0   insulin degludec ([name] FLEXTOUCH) 200 UNIT/ML FlexTouch Pen Inject 44 Units into the skin daily. 15 mL 0   levothyroxine (SYNTHROID) 100 MCG tablet TAKE 1 TABLET(100 MCG) BY MOUTH DAILY  BEFORE BREAKFAST 90 tablet 0   loratadine (CLARITIN) 10 MG tablet Take 10 mg by mouth daily as needed for allergies.     losartan (COZAAR) 25 MG tablet TAKE 1 TABLET BY MOUTH EVERY MORNING THEN TAKE 2 TABLETS BY MOUTH EVERY EVENING 270 tablet 0   meloxicam (MOBIC) 15 MG tablet Take 1 tablet (15 mg total) by mouth daily. 30 tablet 1   metFORMIN (GLUCOPHAGE-XR) 500 MG 24 hr tablet Take 2 tablets (1,000 mg total) by mouth 2 (two) times daily. 360 tablet 3   Multiple Vitamins-Minerals (MULTIVITAMIN MEN PO) Take 1 tablet by mouth daily.     pregabalin (LYRICA) 200 MG capsule Take 1 capsule (200 mg total) by mouth 2 (two) times daily. 180 capsule 1   Semaglutide, 1 MG/DOSE, (OZEMPIC, 1 MG/DOSE,) 4 MG/3ML SOPN Inject 1 mg into the skin once a week. 3 mL 3   simvastatin (ZOCOR) 40 MG tablet TAKE 1 TABLET(40 MG) BY MOUTH AT BEDTIME 90 tablet 3   tadalafil (CIALIS) 20 MG tablet Take by mouth.     testosterone cypionate (DEPOTESTOSTERONE CYPIONATE) 200 MG/ML injection Inject 200 mg into the muscle every 14 (fourteen) days.     No current facility-administered medications on file prior to visit.     ROS see history of present illness  Objective  Physical Exam Vitals:   05/14/21 1159 05/14/21 1324  BP: (!) 150/80 130/80  Pulse: 78   Temp: 98.2 F (36.8 C)   SpO2: 98%     BP Readings from Last 3 Encounters:  05/14/21 130/80  03/27/21 (!) 151/78  12/19/20 (!) 158/96   Wt Readings from Last 3 Encounters:  05/14/21 254 lb 3.2 oz (115.3 kg)  03/27/21 251 lb (113.9 kg)  12/19/20 256 lb 12.8 oz (116.5 kg)    Physical Exam Constitutional:      General: He is not in acute distress.    Appearance: He is not diaphoretic.  Cardiovascular:     Rate and Rhythm: Normal rate and regular rhythm.     Heart sounds: Normal heart sounds.  Pulmonary:     Effort: Pulmonary effort is normal.     Breath sounds: Normal breath sounds.  Skin:    General: Skin is warm and dry.  Neurological:     Mental  Status: He is alert.     Assessment/Plan: Please see individual problem list.  Problem List Items Addressed This Visit     Hypothyroidism (Chronic)    Check TSH.  Continue Synthroid 100 mg daily.      Relevant Orders   TSH   Allergic rhinitis    Patient with intermittent symptoms of allergic rhinitis.  He will continue Flonase.  Discussed potential for adding an additional nasal spray though he declines  this.      Diabetic neuropathy ([place])    Chronic issues with foot pain related to this.  He will monitor.      DM type 2 with diabetic peripheral neuropathy (HCC)    Check A1c.  Continue [name] 44 units daily, [name] 10 mg daily, metformin 1000 mg twice daily, and Ozempic 1 mg weekly.      Relevant Orders   HgB A1c   Essential hypertension - Primary    Well-controlled.  He will continue Cardura 4 mg once daily, carvedilol 6.25 mg twice daily, and losartan 25 mg in the morning and 50 mg at night.      Obesity (BMI 30-39.9)    Encouraged continued diet and exercise changes.        Return in about 3 months (around 08/11/2021) for Diabetes/hypertension/weight.  This visit occurred during the SARS-CoV-2 public health emergency.  Safety protocols were in place, including screening questions prior to the visit, additional usage of staff PPE, and extensive cleaning of exam room while observing appropriate contact time as indicated for disinfecting solutions.    Douglas Rumps, MD [place]

## 2021-05-14 NOTE — Patient Instructions (Signed)
Nice to see you. Will get lab work today and contact you with the results. You can call Dr. Tamala Julian to set up an appointment for your knees. Please continue to work on increasing her activity level and eating less sweets.

## 2021-05-14 NOTE — Assessment & Plan Note (Signed)
Well-controlled.  He will continue Cardura 4 mg once daily, carvedilol 6.25 mg twice daily, and losartan 25 mg in the morning and 50 mg at night.

## 2021-05-14 NOTE — Assessment & Plan Note (Signed)
Check A1c.  Continue [name] 44 units daily, [name] 10 mg daily, metformin 1000 mg twice daily, and Ozempic 1 mg weekly.

## 2021-05-14 NOTE — Assessment & Plan Note (Signed)
Check TSH.  Continue Synthroid 100 mg daily.

## 2021-05-14 NOTE — Assessment & Plan Note (Signed)
Chronic issues with foot pain related to this.  He will monitor.

## 2021-05-14 NOTE — Assessment & Plan Note (Signed)
Encouraged continued diet and exercise changes.

## 2021-05-14 NOTE — Assessment & Plan Note (Signed)
Patient with intermittent symptoms of allergic rhinitis.  He will continue Flonase.  Discussed potential for adding an additional nasal spray though he declines this.

## 2021-05-15 ENCOUNTER — Other Ambulatory Visit: Payer: Self-pay | Admitting: *Deleted

## 2021-05-15 DIAGNOSIS — N401 Enlarged prostate with lower urinary tract symptoms: Secondary | ICD-10-CM

## 2021-05-15 MED ORDER — FINASTERIDE 5 MG PO TABS: ORAL_TABLET | ORAL | 3 refills | Status: DC

## 2021-05-18 DIAGNOSIS — Z20822 Contact with and (suspected) exposure to covid-19: Secondary | ICD-10-CM | POA: Diagnosis not present

## 2021-06-06 ENCOUNTER — Other Ambulatory Visit: Payer: Self-pay | Admitting: Family Medicine

## 2021-06-06 DIAGNOSIS — I10 Essential (primary) hypertension: Secondary | ICD-10-CM

## 2021-06-08 DIAGNOSIS — Z20822 Contact with and (suspected) exposure to covid-19: Secondary | ICD-10-CM | POA: Diagnosis not present

## 2021-06-10 ENCOUNTER — Telehealth: Payer: Self-pay

## 2021-06-10 NOTE — Telephone Encounter (Signed)
I called and spoke with the patient and informed him that his medication has arrived and is available for pickup and he understood.  Elliot Simoneaux,cma

## 2021-06-12 ENCOUNTER — Telehealth: Payer: Self-pay

## 2021-06-12 DIAGNOSIS — Z20822 Contact with and (suspected) exposure to covid-19: Secondary | ICD-10-CM | POA: Diagnosis not present

## 2021-06-12 NOTE — Telephone Encounter (Signed)
I called and informed the patient that his Ozempic has arrived and is available for pickup and he understood.  Koraima Albertsen,cma  ?

## 2021-06-18 ENCOUNTER — Ambulatory Visit: Payer: Self-pay | Admitting: Pharmacist

## 2021-06-18 NOTE — Chronic Care Management (AMB) (Signed)
?  Chronic Care Management  ? ?Note ? ?06/18/2021 ?Name: Douglas [name] MRN: [id number] DOB: [birth year] ? ? ? ?Closing pharmacy CCM case at this time. Will collaborate with Care Guide to outreach to schedule follow up with RN CM. Patient has clinic contact information for future questions or concerns.  ? ?Catie Darnelle Maffucci, PharmD, [place], CPP ?Clinical Pharmacist ?Therapist, music at [employer] ?[phone] ? ?

## 2021-06-25 ENCOUNTER — Other Ambulatory Visit: Payer: Self-pay | Admitting: Family Medicine

## 2021-07-03 DIAGNOSIS — E1122 Type 2 diabetes mellitus with diabetic chronic kidney disease: Secondary | ICD-10-CM | POA: Diagnosis not present

## 2021-07-03 DIAGNOSIS — I10 Essential (primary) hypertension: Secondary | ICD-10-CM | POA: Diagnosis not present

## 2021-07-03 DIAGNOSIS — N1831 Chronic kidney disease, stage 3a: Secondary | ICD-10-CM | POA: Diagnosis not present

## 2021-07-03 DIAGNOSIS — D631 Anemia in chronic kidney disease: Secondary | ICD-10-CM | POA: Diagnosis not present

## 2021-07-03 DIAGNOSIS — N2581 Secondary hyperparathyroidism of renal origin: Secondary | ICD-10-CM | POA: Diagnosis not present

## 2021-07-05 ENCOUNTER — Other Ambulatory Visit: Payer: Self-pay | Admitting: Family Medicine

## 2021-07-11 DIAGNOSIS — Z20822 Contact with and (suspected) exposure to covid-19: Secondary | ICD-10-CM | POA: Diagnosis not present

## 2021-07-13 DIAGNOSIS — Z20822 Contact with and (suspected) exposure to covid-19: Secondary | ICD-10-CM | POA: Diagnosis not present

## 2021-07-25 ENCOUNTER — Other Ambulatory Visit: Payer: Self-pay

## 2021-07-25 ENCOUNTER — Telehealth: Payer: Self-pay

## 2021-07-25 DIAGNOSIS — E1142 Type 2 diabetes mellitus with diabetic polyneuropathy: Secondary | ICD-10-CM

## 2021-07-25 DIAGNOSIS — Z20822 Contact with and (suspected) exposure to covid-19: Secondary | ICD-10-CM | POA: Diagnosis not present

## 2021-07-25 MED ORDER — TRESIBA FLEXTOUCH 200 UNIT/ML ~~LOC~~ SOPN: 44.0000 [IU] | PEN_INJECTOR | Freq: Every day | SUBCUTANEOUS | 0 refills | Status: DC

## 2021-07-25 MED ORDER — NOVOFINE PEN NEEDLE 32G X 6 MM MISC: 1.0000 | Freq: Every day | 3 refills | Status: AC

## 2021-07-25 NOTE — Progress Notes (Signed)
Reorder of Novofine needles and tresiba to send to [employer] patient assistance. Douglas [name],cma  ?

## 2021-07-26 NOTE — Telephone Encounter (Signed)
Signed.

## 2021-07-29 NOTE — Telephone Encounter (Signed)
Faxed today to patient assistance. Confirmation give. ng ?

## 2021-07-30 ENCOUNTER — Telehealth: Payer: Medicare Other

## 2021-08-06 DIAGNOSIS — Z20822 Contact with and (suspected) exposure to covid-19: Secondary | ICD-10-CM | POA: Diagnosis not present

## 2021-08-12 ENCOUNTER — Encounter: Payer: Self-pay | Admitting: Family Medicine

## 2021-08-12 ENCOUNTER — Ambulatory Visit (INDEPENDENT_AMBULATORY_CARE_PROVIDER_SITE_OTHER): Payer: Medicare Other | Admitting: Family Medicine

## 2021-08-12 ENCOUNTER — Ambulatory Visit (INDEPENDENT_AMBULATORY_CARE_PROVIDER_SITE_OTHER): Payer: Medicare Other

## 2021-08-12 ENCOUNTER — Other Ambulatory Visit: Payer: Self-pay | Admitting: Family Medicine

## 2021-08-12 VITALS — BP 120/80 | HR 83 | Temp 98.0°F | Ht 71.0 in | Wt 251.0 lb

## 2021-08-12 DIAGNOSIS — E1142 Type 2 diabetes mellitus with diabetic polyneuropathy: Secondary | ICD-10-CM | POA: Diagnosis not present

## 2021-08-12 DIAGNOSIS — H1010 Acute atopic conjunctivitis, unspecified eye: Secondary | ICD-10-CM

## 2021-08-12 DIAGNOSIS — L089 Local infection of the skin and subcutaneous tissue, unspecified: Secondary | ICD-10-CM

## 2021-08-12 DIAGNOSIS — M7989 Other specified soft tissue disorders: Secondary | ICD-10-CM | POA: Diagnosis not present

## 2021-08-12 DIAGNOSIS — Z20822 Contact with and (suspected) exposure to covid-19: Secondary | ICD-10-CM | POA: Diagnosis not present

## 2021-08-12 MED ORDER — SULFAMETHOXAZOLE-TRIMETHOPRIM 800-160 MG PO TABS: 1.0000 | ORAL_TABLET | Freq: Two times a day (BID) | ORAL | 0 refills | Status: DC

## 2021-08-12 MED ORDER — HYDROCODONE-ACETAMINOPHEN 5-325 MG PO TABS: 1.0000 | ORAL_TABLET | Freq: Four times a day (QID) | ORAL | 0 refills | Status: DC | PRN

## 2021-08-12 NOTE — Patient Instructions (Addendum)
Nice to see you. ?We will start you on Bactrim to treat possible infection in your toe.  We will try to get you into see the podiatrist fairly quickly as well. ?We will contact you with your x-ray result. ?If you develop spreading redness, increasing pain, fevers, or any new symptoms with your foot please get reevaluated. ?I refilled your hydrocodone. ?

## 2021-08-12 NOTE — Assessment & Plan Note (Signed)
Stable.  Controlled substance database reviewed.  Hydrocodone refilled.  He will continue Cymbalta 60 mg daily and Lyrica 200 mg daily. ?

## 2021-08-12 NOTE — Assessment & Plan Note (Signed)
Well-controlled on recent labs through nephrology.  He will continue Ozempic 1 mg weekly, metformin XR 1000 mg twice daily, [name] 44 units daily, and [name] 10 mg daily. ?

## 2021-08-12 NOTE — Assessment & Plan Note (Signed)
I encouraged the patient to try Zyrtec given that Douglas [name] is making him drowsy. ?

## 2021-08-12 NOTE — Assessment & Plan Note (Signed)
Exam is concerning for toe infection.  We will start the patient on Bactrim.  We will get an x-ray to evaluate his bony structures in his foot.  He will be referred to his podiatrist for further evaluation and management.  He is advised to seek medical attention for worsening pain, spreading redness, fevers, or any worsening or new symptoms. ?

## 2021-08-12 NOTE — Progress Notes (Signed)
?[name], MD ?Phone: [phone] ? ?Douglas [name] is a 76 y.o. male who presents today for follow-up. ? ?Diabetes: Patient notes his sugars were less than 130.  He lost his meter and is in the process of getting another one.  He is taking [place], Ozempic, metformin, and [name] 44 units daily.  He notes some chronic polydipsia.  He only has low blood sugar issues if he skips a meal.  He is due to see ophthalmology. ? ?Allergic conjunctivitis: Patient reports intermittent issues with itchy watery eyes related to allergies.  Claritin stopped working.  He started on Allegra which is beneficial that does make him somewhat drowsy. ? ?Neuropathy: Patient continues to take Lyrica, Cymbalta, and use clove oil.  He has not required a hydrocodone in about 2 months. ? ?Left toe infection: Patient notes he had his great toenails removed bilaterally a few months ago.  He notes the left one grew back abnormally and has been inflamed.  Notes it was so irritated 3 to 4 weeks ago he tried to get set up with podiatry though notes they were unable to see him until it had been 3 to 4 weeks and he did not end up scheduling appointment.  He debrided the area with a brush and cleanse the area with peroxide and Epsom salts.  He notes some occasional purulent drainage.  He notes this has been going on about 5 weeks. ? ?Social History  ? ?Tobacco Use  ?Smoking Status Never  ?Smokeless Tobacco Never  ? ? ?Current Outpatient Medications on File Prior to Visit  ?Medication Sig Dispense Refill  ? acetaminophen (TYLENOL) 500 MG tablet Take 500 mg by mouth every 6 (six) hours as needed.    ? allopurinol (ZYLOPRIM) 300 MG tablet TAKE 1 TABLET(300 MG) BY MOUTH DAILY 90 tablet 1  ? aspirin 81 MG tablet Take 1 tablet (81 mg total) by mouth daily.    ? calcitRIOL (ROCALTROL) 0.25 MCG capsule Take 0.25 mcg by mouth daily.   5  ? clove oil liquid Apply 1 application topically as needed.    ? cyclobenzaprine (FLEXERIL) 10 MG tablet Take 1  tablet by mouth as needed.    ? dapagliflozin propanediol ([name]) 10 MG TABS tablet Take 1 tablet (10 mg total) by mouth daily. 90 tablet 3  ? doxazosin (CARDURA) 4 MG tablet TAKE 1 TABLET(4 MG) BY MOUTH DAILY 90 tablet 1  ? DULoxetine (CYMBALTA) 60 MG capsule TAKE 1 CAPSULE(60 MG) BY MOUTH DAILY 90 capsule 1  ? esomeprazole (NEXIUM) 40 MG capsule Take 1 capsule (40 mg total) by mouth daily at 12 noon. 90 capsule 3  ? finasteride (PROSCAR) 5 MG tablet TAKE 1 TABLET(5 MG) BY MOUTH AT BEDTIME 90 tablet 3  ? fluticasone (FLONASE) 50 MCG/ACT nasal spray Place 2 sprays into both nostrils daily. 16 g 6  ? furosemide (LASIX) 40 MG tablet TAKE 1 TABLET(40 MG) BY MOUTH DAILY 90 tablet 1  ? insulin degludec ([name] FLEXTOUCH) 200 UNIT/ML FlexTouch Pen Inject 44 Units into the skin daily. 15 mL 0  ? Insulin Pen Needle (NOVOFINE PEN NEEDLE) 32G X 6 MM MISC 1 each by Does not apply route daily. 100 each 3  ? levothyroxine (SYNTHROID) 100 MCG tablet TAKE 1 TABLET(100 MCG) BY MOUTH DAILY BEFORE AND BREAKFAST 90 tablet 0  ? loratadine (CLARITIN) 10 MG tablet Take 10 mg by mouth daily as needed for allergies.    ? losartan (COZAAR) 25 MG tablet TAKE 1 TABLET BY MOUTH  EVERY MORNING THEN TAKE 2 TABLETS BY MOUTH EVERY EVENING 270 tablet 0  ? meloxicam (MOBIC) 15 MG tablet Take 1 tablet (15 mg total) by mouth daily. 30 tablet 1  ? metFORMIN (GLUCOPHAGE-XR) 500 MG 24 hr tablet Take 2 tablets (1,000 mg total) by mouth 2 (two) times daily. 360 tablet 3  ? Multiple Vitamins-Minerals (MULTIVITAMIN MEN PO) Take 1 tablet by mouth daily.    ? pregabalin (LYRICA) 200 MG capsule TAKE 1 CAPSULE(200 MG) BY MOUTH TWICE DAILY 180 capsule 1  ? Semaglutide, 1 MG/DOSE, (OZEMPIC, 1 MG/DOSE,) 4 MG/3ML SOPN Inject 1 mg into the skin once a week. 3 mL 3  ? simvastatin (ZOCOR) 40 MG tablet TAKE 1 TABLET(40 MG) BY MOUTH AT BEDTIME 90 tablet 3  ? tadalafil (CIALIS) 20 MG tablet Take by mouth.    ? testosterone cypionate (DEPOTESTOSTERONE CYPIONATE) 200  MG/ML injection Inject 200 mg into the muscle every 14 (fourteen) days.    ? ?No current facility-administered medications on file prior to visit.  ? ? ? ?ROS see history of present illness ? ?Objective ? ?Physical Exam ?Vitals:  ? 08/12/21 1134  ?BP: 120/80  ?Pulse: 83  ?Temp: 98 ?F (36.7 ?C)  ?SpO2: 97%  ? ? ?BP Readings from Last 3 Encounters:  ?08/12/21 120/80  ?05/14/21 130/80  ?03/27/21 (!) 151/78  ? ?Wt Readings from Last 3 Encounters:  ?08/12/21 251 lb (113.9 kg)  ?05/14/21 254 lb 3.2 oz (115.3 kg)  ?03/27/21 251 lb (113.9 kg)  ? ? ?Physical Exam ?Constitutional:   ?   General: He is not in acute distress. ?   Appearance: He is not diaphoretic.  ?Cardiovascular:  ?   Rate and Rhythm: Normal rate and regular rhythm.  ?   Heart sounds: Normal heart sounds.  ?Pulmonary:  ?   Effort: Pulmonary effort is normal.  ?   Breath sounds: Normal breath sounds.  ?Skin: ?   General: Skin is warm and dry.  ?Neurological:  ?   Mental Status: He is alert.  ? ? ? ? ? ? ?Diabetic Foot Exam - Simple   ?Simple Foot Form ?Diabetic Foot exam was performed with the following findings: Yes 08/12/2021 12:23 PM  ?Visual Inspection ?See comments: Yes ?Sensation Testing ?See comments: Yes ?Pulse Check ?Posterior Tibialis and Dorsalis pulse intact bilaterally: Yes ?Comments ?See pictures of left great toe for deformity, reduced light touch sensation bilateral feet, absent monofilament sensation bilateral feet ?  ? ? ? ?Assessment/Plan: Please see individual problem list. ? ?Problem List Items Addressed This Visit   ? ? Allergic conjunctivitis  ?  I encouraged the patient to try Zyrtec given that Delma Freeze is making him drowsy. ? ?  ?  ? Diabetic neuropathy ([place])  ?  Stable.  Controlled substance database reviewed.  Hydrocodone refilled.  He will continue Cymbalta 60 mg daily and Lyrica 200 mg daily. ? ?  ?  ? Relevant Medications  ? HYDROcodone-acetaminophen (NORCO/VICODIN) 5-325 MG tablet  ? DM type 2 with diabetic peripheral neuropathy  ([place])  ?  Well-controlled on recent labs through nephrology.  He will continue Ozempic 1 mg weekly, metformin XR 1000 mg twice daily, [name] 44 units daily, and [name] 10 mg daily. ? ?  ?  ? Toe infection - Primary  ?  Exam is concerning for toe infection.  We will start the patient on Bactrim.  We will get an x-ray to evaluate his bony structures in his foot.  He will be referred to his  podiatrist for further evaluation and management.  He is advised to seek medical attention for worsening pain, spreading redness, fevers, or any worsening or new symptoms. ? ?  ?  ? Relevant Medications  ? sulfamethoxazole-trimethoprim (BACTRIM DS) 800-160 MG tablet  ? Other Relevant Orders  ? DG Foot Complete Left  ? Ambulatory referral to Podiatry  ? ? ? ?Health Maintenance: The patient was encouraged to call his eye doctor for his yearly eye exam.   ? ?Return in about 3 months (around 11/12/2021). ? ?This visit occurred during the SARS-CoV-2 public health emergency.  Safety protocols were in place, including screening questions prior to the visit, additional usage of staff PPE, and extensive cleaning of exam room while observing appropriate contact time as indicated for disinfecting solutions.  ? ? ?[name], MD ?[place] ? ?

## 2021-08-16 DIAGNOSIS — Z20822 Contact with and (suspected) exposure to covid-19: Secondary | ICD-10-CM | POA: Diagnosis not present

## 2021-08-20 DIAGNOSIS — Z20822 Contact with and (suspected) exposure to covid-19: Secondary | ICD-10-CM | POA: Diagnosis not present

## 2021-08-24 IMAGING — US US RENAL
1 series · 13 of 25 positions shown · non-contrast
Comparison: Abdominal MRI 03/13/2019

CLINICAL DATA: Follow-up renal mass.

EXAM:
RENAL / URINARY TRACT ULTRASOUND COMPLETE

[Series 1: us renal · 0.31mm/px · 63 acquisitions, 13 frames shown]
[im 1/63]
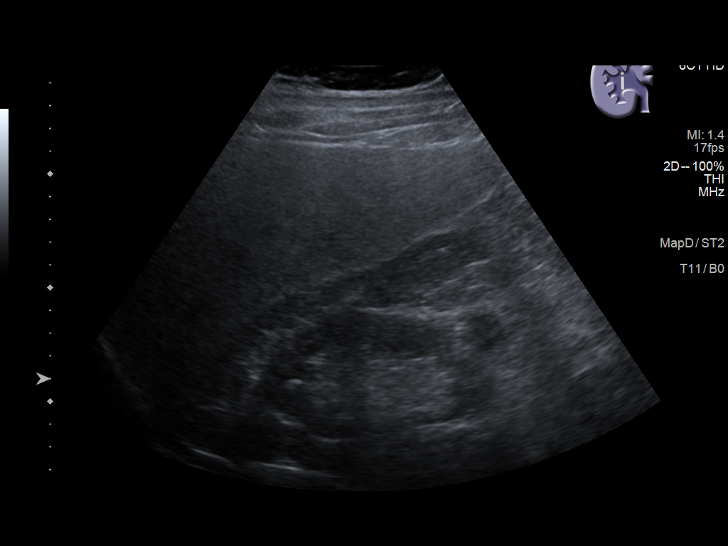
[im 6/63]
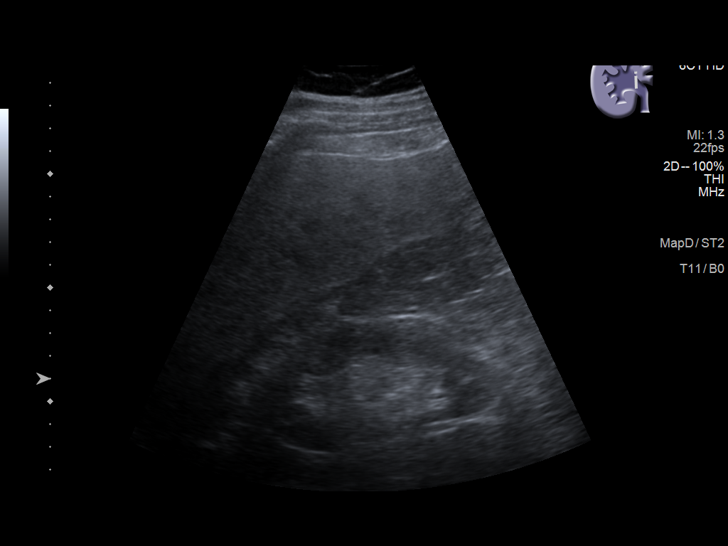
[im 11/63]
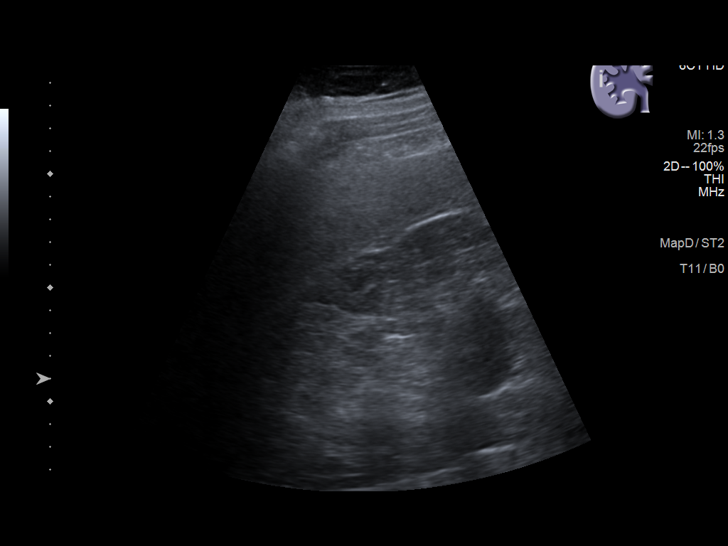
[im 16/63]
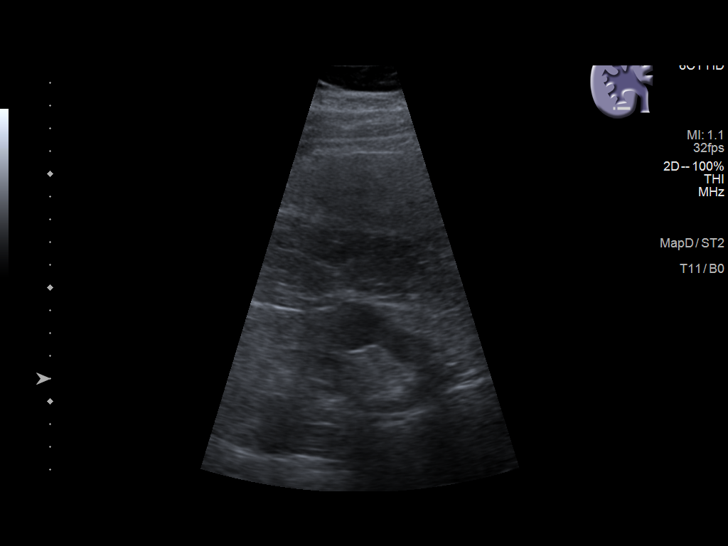
[im 21/63]
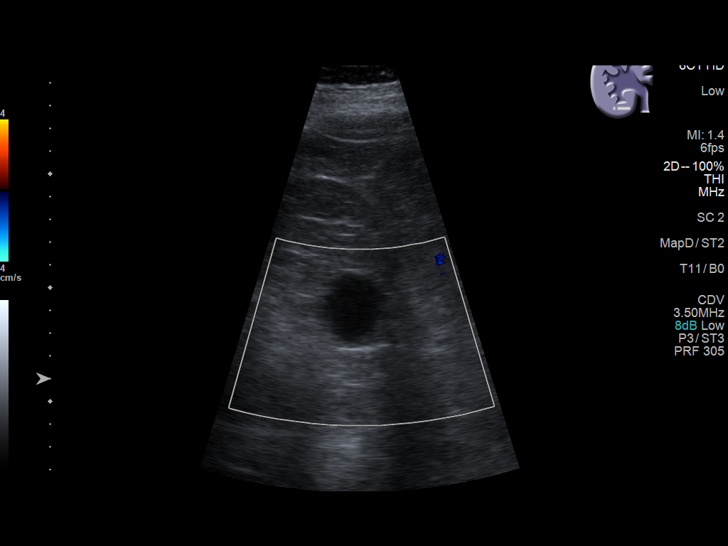
[im 26/63]
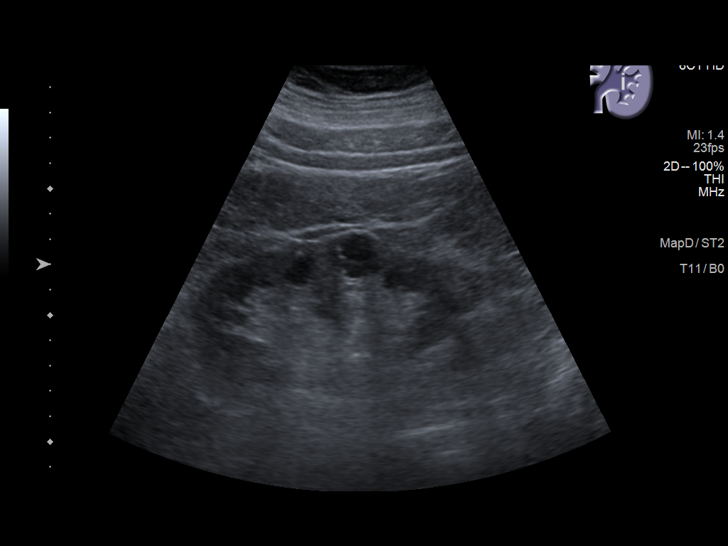
[im 32/63]
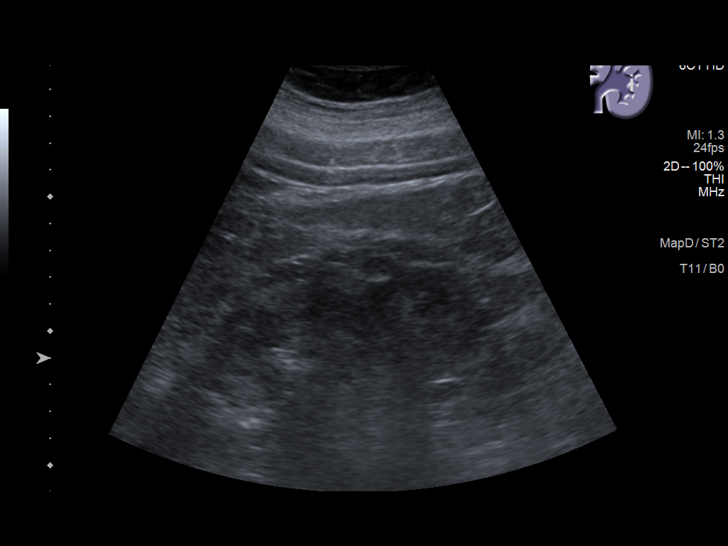
[im 37/63]
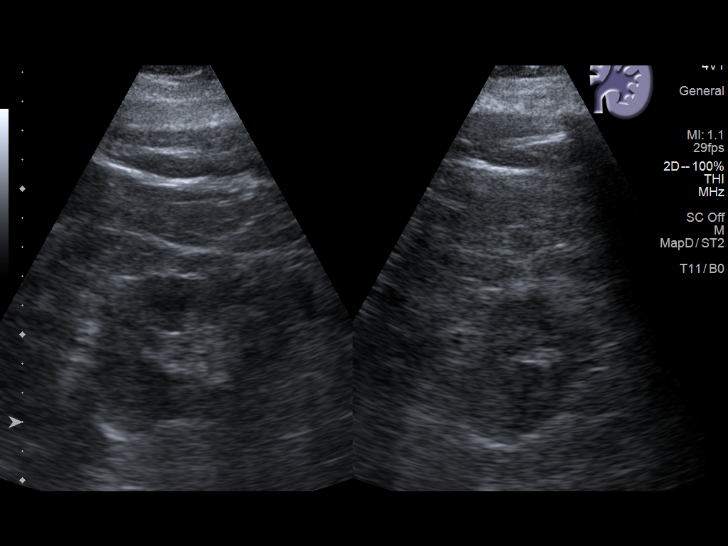
[im 42/63]
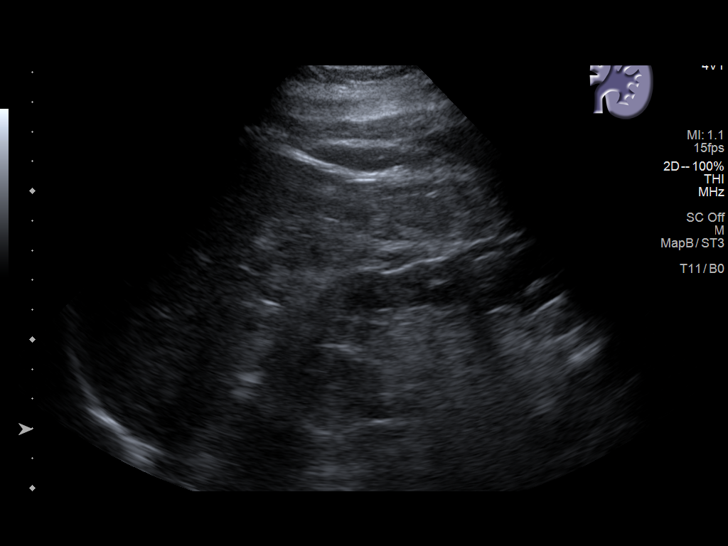
[im 47/63]
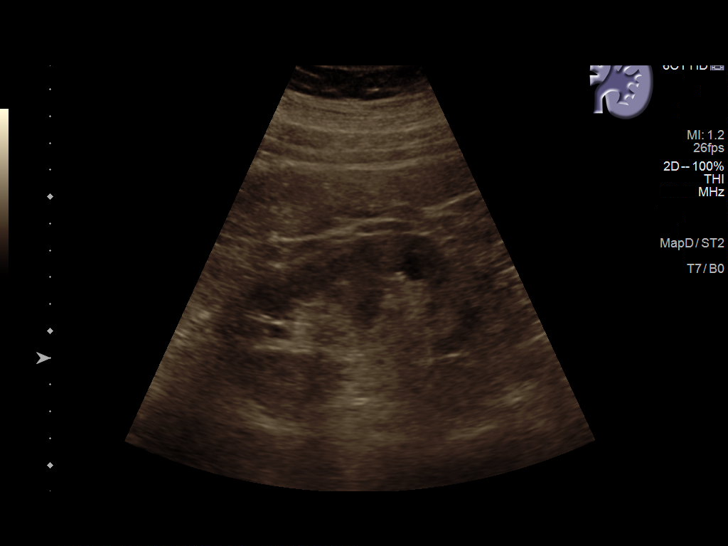
[im 52/63]
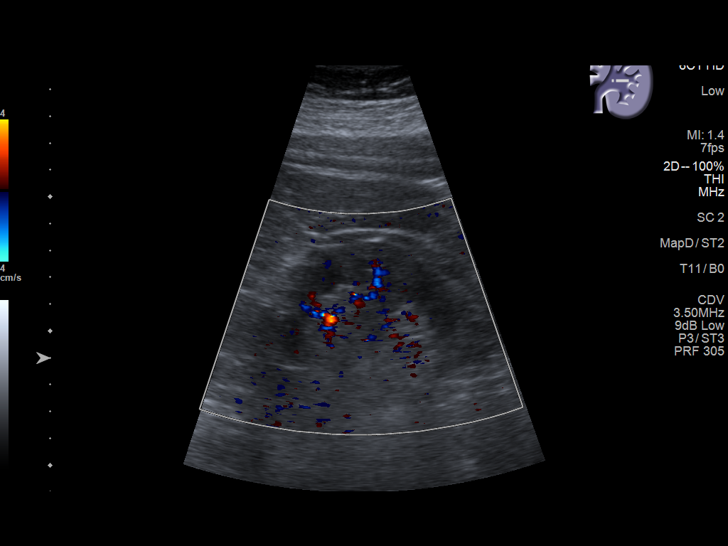
[im 57/63]
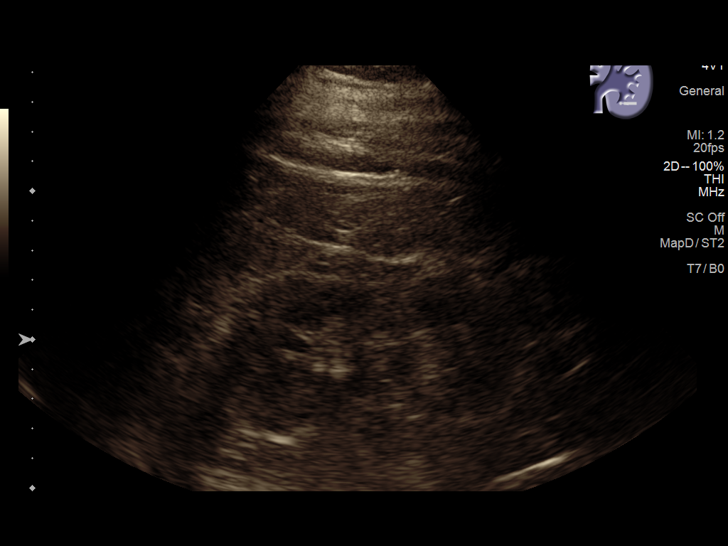
[im 63/63]
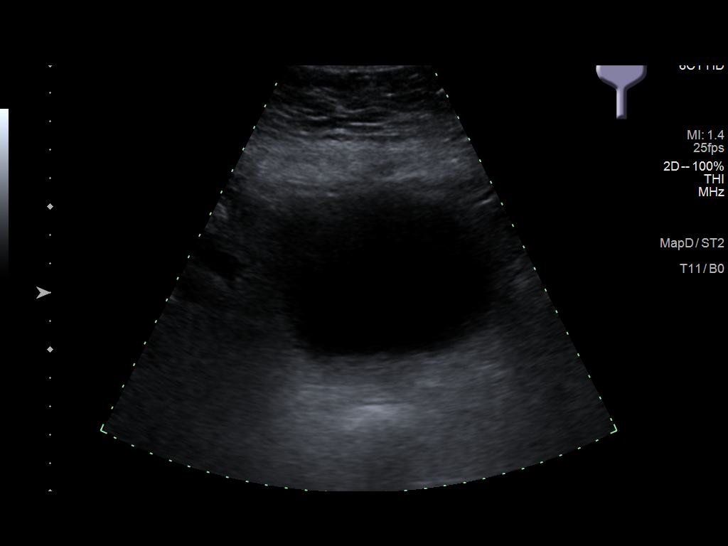

[13 of 25 positions shown; findings below may reference images not displayed]

FINDINGS: Sonographic evaluation is limited due to habitus.

Right Kidney:

Renal measurements: 10.8 x 5.3 x 5.6 cm = volume: 166 mL.
Echogenicity within normal limits. Two adjacent cysts versus a
septated cyst in the lower right kidney, measuring 3.1 x 3.5 x
cm and 4.2 x 4.8 x 4.0 cm. This is grossly similar to that seen on
prior MRI. Small cortical cyst in the mid kidney. Many of the
smaller cysts are not well characterized sonographically.

Left Kidney:

Renal measurements: 10.8 x 5.3 x 5.0 cm = volume: 150 mL. Isoechoic
lesion in the upper pole measures 2.9 x 2.6 x 3.0 cm, and
corresponds to the suspicious lesion on MRI where it measured 2.9 x
2.5 x 2.5 cm. Probable internal blood flow is demonstrated. There is
no additional cyst in the mid lower kidney measuring 1.5 x 1.2 x
cm. No hydronephrosis.

Bladder:

Appears normal for degree of bladder distention.

Other:

None.
IMPRESSION: 1. Solid-appearing lesion in the upper left kidney measuring 2.9 x
2.6 x 3.0 cm. This measured 2.9 x 2.5 x 2.5 cm on abdominal MRI 5
months ago.
2. Additional bilateral renal cysts, including a probable complex
cyst in the lower right kidney. Some of the lesions on prior MRI are
not well seen sonographically.

## 2021-09-04 ENCOUNTER — Telehealth: Payer: Self-pay

## 2021-09-04 NOTE — Telephone Encounter (Signed)
I called and spoke with the patients wife and informed her that the patients [name] medication has came in and they can pick it up anytime between 8 am and 5 pm.  Duanne Duchesne,cma

## 2021-09-06 ENCOUNTER — Ambulatory Visit (INDEPENDENT_AMBULATORY_CARE_PROVIDER_SITE_OTHER): Payer: Medicare Other | Admitting: Podiatry

## 2021-09-06 ENCOUNTER — Encounter: Payer: Self-pay | Admitting: Podiatry

## 2021-09-06 DIAGNOSIS — L308 Other specified dermatitis: Secondary | ICD-10-CM

## 2021-09-06 MED ORDER — GENTAMICIN SULFATE 0.1 % EX CREA: 1.0000 "application " | TOPICAL_CREAM | Freq: Two times a day (BID) | CUTANEOUS | 1 refills | Status: DC

## 2021-09-06 MED ORDER — CLOTRIMAZOLE-BETAMETHASONE 1-0.05 % EX CREA: 1.0000 "application " | TOPICAL_CREAM | Freq: Two times a day (BID) | CUTANEOUS | 1 refills | Status: DC

## 2021-09-06 NOTE — Progress Notes (Signed)
HPI: 76 y.o. male presenting today for concern of callus formation and concern to the left hallux.  Patient was last seen in the office 04/26/2021.  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  He states that he is developing some sensitivity and tenderness to the left hallux.  X-rays were taken at the PCP office negative for osteo-.  He recently completed a round of Bactrim DS.  Presenting for further treatment and evaluation  Past Medical History:  Diagnosis Date   Arthritis    Depression    Diabetes mellitus with complication ([place])    Diabetic neuropathy ([place])    Diastolic dysfunction    a. TTE 07/2017: EF 60-65%, mild concentric LVH, no RWMA, Gr1DD, trivial AI, mildly dilated LA, RVSF normal, PASP normal   Edema    feet/legs   GERD (gastroesophageal reflux disease)    Gout    History of stress test    a. MV 06/2017: small in size, mild in severity, apical anterior and apical defect that was minimally reversible and most likely represented artifact and less likely ischemia/scar, LVEF 55-65%, low risk, probably normal stress test   Hypertension    Hypothyroidism    Kidney cysts    renal failure 2013   Kidney stones    Dr. Rogers Blocker   OSA (obstructive sleep apnea)    supplemental oxygen at night   Oxygen dependent    hs   [facility] spotted fever 12/10/2017   Positve IgG in titer on 11/20/17   S/P cardiac cath 1998   a. no obstructive disease   Syncope and collapse    Temporary low platelet count ([place])     Past Surgical History:  Procedure Laterality Date   ANAL FISSURE REPAIR     BACK SURGERY  1977   rupture disc lumbar spine   [facility] with [place] [facility].    CATARACT EXTRACTION W/PHACO Right 07/16/2016   Procedure: CATARACT EXTRACTION PHACO AND INTRAOCULAR LENS PLACEMENT (IOC);  Surgeon: [name], MD;  Location: [facility] ORS;  Service: Ophthalmology;  Laterality: Right;  [place]  01:11 AP% 17.6 CDE 24.83 fluid pack lot # [id number]   CATARACT EXTRACTION W/PHACO Left 08/13/2016   Procedure: CATARACT EXTRACTION PHACO AND INTRAOCULAR LENS PLACEMENT ([place]) suture placed in left eye at end of procedure;  Surgeon: [name], MD;  Location: [facility] ORS;  Service: Ophthalmology;  Laterality: Left;  [place] 01:55 AP% 22.7 CDE 53.56 fluid pack lot # [id number]   FINGER AMPUTATION     partial   KNEE SURGERY  1993   arthroscopy   SHOULDER SURGERY Bilateral 1998   arthroscopic right, rotator cuff repair left    Allergies  Allergen Reactions   Bee Venom Swelling   Dye Fdc Red [Red Dye] Swelling and Other (See Comments)    Reaction: gout   Omeprazole     Other reaction(s): increased sx on high dose   Atenolol Other (See Comments)    Did not regulate blood pressure   Benadryl [Diphenhydramine Hcl (Sleep)] Other (See Comments)    Hyperactivity    Enalapril Itching    itching   Gabapentin Palpitations and Rash    rash   Hctz [Hydrochlorothiazide] Other (See Comments)    Decreased potassium     Physical Exam: General: The patient is alert and oriented x3 in no acute distress.  Dermatology: Skin is warm, dry and supple bilateral lower extremities. Negative for  open lesions or macerations.  Vascular: Palpable pedal pulses bilaterally. Capillary refill within normal limits.  Negative for any significant edema or erythema  Neurological: Light touch and protective threshold grossly intact  Musculoskeletal Exam: No pedal deformities noted  Radiographic Exam:  Normal osseous mineralization. Joint spaces preserved. No fracture/dislocation/boney destruction.    Assessment: 1.  Vesicular dermatitis/possible tinea pedis left foot   Plan of Care:  1. Patient evaluated.  Light debridement of the hyperkeratotic callus tissue and skin as well as the vesicular lesions was performed today 2.  Vesicular lesions noted at the plantar base of the lesser toes of the left foot.  With  debridement there is serous drainage.  Concerning for possible vesicular dermatitis or tinea pedis 3.  Prescription for both gentamicin cream and Lotrisone cream to apply a 50-50 mixture 2 times daily 4.  Return to clinic 3 weeks     Edrick Kins, DPM [facility]  Dr. Edrick Kins, DPM    [address], [place] [address]                Office [phone]  Fax [phone]

## 2021-09-10 ENCOUNTER — Ambulatory Visit (INDEPENDENT_AMBULATORY_CARE_PROVIDER_SITE_OTHER): Payer: Medicare Other

## 2021-09-10 VITALS — BP 129/71 | Ht 71.0 in | Wt 251.0 lb

## 2021-09-10 DIAGNOSIS — Z0000 Encounter for general adult medical examination without abnormal findings: Secondary | ICD-10-CM

## 2021-09-10 NOTE — Progress Notes (Signed)
Subjective:   Douglas [name] is a 76 y.o. male who presents for Medicare Annual/Subsequent preventive examination.  Review of Systems    No ROS.  Medicare Wellness Virtual Visit.  Visual/audio telehealth visit, UTA vital signs.   See social history for additional risk factors.   Cardiac Risk Factors include: advanced age (>18mn, >>39women);diabetes mellitus;male gender     Objective:    Today's Vitals   09/10/21 1234  BP: 129/71  Weight: 251 lb (113.9 kg)  Height: '5\' 11"'$  (1.803 m)   Body mass index is 35.01 kg/m.     09/10/2021   12:47 PM 09/05/2020   11:44 AM 09/05/2019    1:24 PM 01/19/2019    7:28 PM 12/10/2018   10:55 AM 08/09/2018    3:58 PM 07/03/2017   10:51 AM  Advanced Directives  Does Patient Have a Medical Advance Directive? Yes Yes Yes No No Yes No  Type of AParamedicof ALesterLiving will HCentre HallLiving will HLandoverLiving will   [place]ay[place] will   Does patient want to make changes to medical advance directive? No - Patient declined No - Patient declined No - Patient declined   No - Patient declined   Copy of HCliftonin Chart? No - copy requested No - copy requested No - copy requested   No - copy requested   Would patient like information on creating a medical advance directive?       No - Patient declined    Current Medications (verified) Outpatient Encounter Medications as of 09/10/2021  Medication Sig   acetaminophen (TYLENOL) 500 MG tablet Take 500 mg by mouth every 6 (six) hours as needed.   allopurinol (ZYLOPRIM) 300 MG tablet TAKE 1 TABLET(300 MG) BY MOUTH DAILY   aspirin 81 MG tablet Take 1 tablet (81 mg total) by mouth daily.   calcitRIOL (ROCALTROL) 0.25 MCG capsule Take 0.25 mcg by mouth daily.    carvedilol (COREG) 6.25 MG tablet TAKE 1 TABLET(6.25 MG) BY MOUTH TWICE DAILY WITH A MEAL   clotrimazole-betamethasone (LOTRISONE) cream  Apply 1 application. topically 2 (two) times daily.   clove oil liquid Apply 1 application topically as needed.   cyclobenzaprine (FLEXERIL) 10 MG tablet Take 1 tablet by mouth as needed.   dapagliflozin propanediol ([name]) 10 MG TABS tablet Take 1 tablet (10 mg total) by mouth daily.   doxazosin (CARDURA) 4 MG tablet TAKE 1 TABLET(4 MG) BY MOUTH DAILY   DULoxetine (CYMBALTA) 60 MG capsule TAKE 1 CAPSULE(60 MG) BY MOUTH DAILY   esomeprazole (NEXIUM) 40 MG capsule Take 1 capsule (40 mg total) by mouth daily at 12 noon.   finasteride (PROSCAR) 5 MG tablet TAKE 1 TABLET(5 MG) BY MOUTH AT BEDTIME   fluticasone (FLONASE) 50 MCG/ACT nasal spray Place 2 sprays into both nostrils daily.   furosemide (LASIX) 40 MG tablet TAKE 1 TABLET(40 MG) BY MOUTH DAILY   gentamicin cream (GARAMYCIN) 0.1 % Apply 1 application. topically 2 (two) times daily.   HYDROcodone-acetaminophen (NORCO/VICODIN) 5-325 MG tablet Take 1 tablet by mouth every 6 (six) hours as needed for moderate pain.   insulin degludec ([name] FLEXTOUCH) 200 UNIT/ML FlexTouch Pen Inject 44 Units into the skin daily.   Insulin Pen Needle (NOVOFINE PEN NEEDLE) 32G X 6 MM MISC 1 each by Does not apply route daily.   levothyroxine (SYNTHROID) 100 MCG tablet TAKE 1 TABLET(100 MCG) BY MOUTH DAILY BEFORE AND BREAKFAST  loratadine (CLARITIN) 10 MG tablet Take 10 mg by mouth daily as needed for allergies.   losartan (COZAAR) 25 MG tablet TAKE 1 TABLET BY MOUTH EVERY MORNING THEN TAKE 2 TABLETS BY MOUTH EVERY EVENING   meloxicam (MOBIC) 15 MG tablet Take 1 tablet (15 mg total) by mouth daily.   metFORMIN (GLUCOPHAGE-XR) 500 MG 24 hr tablet Take 2 tablets (1,000 mg total) by mouth 2 (two) times daily.   Multiple Vitamins-Minerals (MULTIVITAMIN MEN PO) Take 1 tablet by mouth daily.   pregabalin (LYRICA) 200 MG capsule TAKE 1 CAPSULE(200 MG) BY MOUTH TWICE DAILY   Semaglutide, 1 MG/DOSE, (OZEMPIC, 1 MG/DOSE,) 4 MG/3ML SOPN Inject 1 mg into the skin once a  week.   simvastatin (ZOCOR) 40 MG tablet TAKE 1 TABLET(40 MG) BY MOUTH AT BEDTIME   sulfamethoxazole-trimethoprim (BACTRIM DS) 800-160 MG tablet Take 1 tablet by mouth 2 (two) times daily.   tadalafil (CIALIS) 20 MG tablet Take by mouth.   testosterone cypionate (DEPOTESTOSTERONE CYPIONATE) 200 MG/ML injection Inject 200 mg into the muscle every 14 (fourteen) days.   No facility-administered encounter medications on file as of 09/10/2021.    Allergies (verified) Bee venom, Dye fdc red [red dye], Omeprazole, Atenolol, Benadryl [diphenhydramine hcl (sleep)], Enalapril, Gabapentin, and Hctz [hydrochlorothiazide]   History: Past Medical History:  Diagnosis Date   Arthritis    Depression    Diabetes mellitus with complication ([place])    Diabetic neuropathy ([place])    Diastolic dysfunction    a. TTE 07/2017: EF 60-65%, mild concentric LVH, no RWMA, Gr1DD, trivial AI, mildly dilated LA, RVSF normal, PASP normal   Edema    feet/legs   GERD (gastroesophageal reflux disease)    Gout    History of stress test    a. MV 06/2017: small in size, mild in severity, apical anterior and apical defect that was minimally reversible and most likely represented artifact and less likely ischemia/scar, LVEF 55-65%, low risk, probably normal stress test   Hypertension    Hypothyroidism    Kidney cysts    renal failure 2013   Kidney stones    Dr. Rogers Blocker   OSA (obstructive sleep apnea)    supplemental oxygen at night   Oxygen dependent    hs   [facility] spotted fever 12/10/2017   Positve IgG in titer on 11/20/17   S/P cardiac cath 1998   a. no obstructive disease   Syncope and collapse    Temporary low platelet count ([place])    Past Surgical History:  Procedure Laterality Date   ANAL FISSURE REPAIR     BACK SURGERY  1977   rupture disc lumbar spine   [facility] [place] [facility].    CATARACT EXTRACTION W/PHACO Right 07/16/2016   Procedure: CATARACT  EXTRACTION PHACO AND INTRAOCULAR LENS PLACEMENT (IOC);  Surgeon: [name], MD;  Location: [facility] ORS;  Service: Ophthalmology;  Laterality: Right;  [place] 01:11 AP% 17.6 CDE 24.83 fluid pack lot # [id number]   CATARACT EXTRACTION W/PHACO Left 08/13/2016   Procedure: CATARACT EXTRACTION PHACO AND INTRAOCULAR LENS PLACEMENT ([place]) suture placed in left eye at end of procedure;  Surgeon: [name], MD;  Location: [facility] ORS;  Service: Ophthalmology;  Laterality: Left;  [place] 01:55 AP% 22.7 CDE 53.56 fluid pack lot # [id number]   FINGER AMPUTATION     partial   KNEE SURGERY  1993   arthroscopy   SHOULDER SURGERY Bilateral 1998   arthroscopic right, rotator  cuff repair left   Family History  Problem Relation Age of Onset   Breast cancer Mother    Lung cancer Mother    Bone cancer Mother    Heart Problems Mother    Cancer Mother        breast, lung and rib   AAA (abdominal aortic aneurysm) Mother    Heart disease Mother    Heart attack Father    Heart disease Father    Cancer Brother        esophageal   Heart attack Brother    Colon cancer Neg Hx    Social History   Socioeconomic History   Marital status: Married    Spouse name: [name]   Number of children: 3   Years of education: 13   Highest education level: Not on file  Occupational History    Comment: retired  Tobacco Use   Smoking status: Never   Smokeless tobacco: Never  Vaping Use   Vaping Use: Never used  Substance and Sexual Activity   Alcohol use: No   Drug use: No   Sexual activity: Yes  Other Topics Concern   Not on file  Social History Narrative   Lives in [place] with his wife ([name]). No children. Three step children. No pets.      Work - Patient is retired. Maintenance supervisor.      School - One year college education.      Right handed.      [place], served in [place], no combat               Social Determinants of Radio broadcast assistant Strain: Low Risk     Difficulty of Paying Living Expenses: Not very hard  Food Insecurity: No Food Insecurity   Worried About Charity fundraiser in the Last Year: Never true   Arboriculturist in the Last Year: Never true  Transportation Needs: No Transportation Needs   Lack of Transportation (Medical): No   Lack of Transportation (Non-Medical): No  Physical Activity: Insufficiently Active   Days of Exercise per Week: 4 days   Minutes of Exercise per Session: 10 min  Stress: No Stress Concern Present   Feeling of Stress : Only a little  Social Connections: Unknown   Frequency of Communication with Friends and Family: Not on file   Frequency of Social Gatherings with Friends and Family: Not on file   Attends Religious Services: Not on Electrical engineer or Organizations: Not on file   Attends Archivist Meetings: Not on file   Marital Status: Married    Tobacco Counseling Counseling given: Not Answered   Clinical Intake:  Pre-visit preparation completed: Yes        Diabetes: Yes (Followed by Endocrinology)  How often do you need to have someone help you when you read instructions, pamphlets, or other written materials from your doctor or pharmacy?: 1 - Never  Interpreter Needed?: No      Activities of Daily Living    09/10/2021   12:50 PM  In your present state of health, do you have any difficulty performing the following activities:  Hearing? 0  Vision? 0  Difficulty concentrating or making decisions? 0  Walking or climbing stairs? 0  Dressing or bathing? 0  Doing errands, shopping? 0  Preparing Food and eating ? N  Using the Toilet? N  In the past six months, have you accidently  leaked urine? N  Do you have problems with loss of bowel control? N  Managing your Medications? N  Managing your Finances? N  Housekeeping or managing your Housekeeping? N    Patient Care Team: [name], MD as PCP - General (Family Medicine) Wellington Hampshire, MD  as PCP - Cardiology (Cardiology) Anthonette Legato, MD (Internal Medicine) Watt Climes, PA as Physician Assistant (Physician Assistant)  Indicate any recent Medical Services you may have received from other than [facility] providers in the past year (date may be approximate).     Assessment:   This is a routine wellness examination for Douglas [name].  Virtual Visit via Telephone Note  I connected with  Douglas [name] on 09/10/21 at 12:30 PM EDT by telephone and verified that I am speaking with the correct person using two identifiers.  Persons participating in the virtual visit: patient/Nurse Health Advisor   I discussed the limitations of performing an evaluation and management service by telehealth. We continued and completed visit with audio only. Some vital signs may be absent or patient reported.   Hearing/Vision screen Hearing Screening - Comments:: Patient is able to hear conversational tones without difficulty. No issues reported. Vision Screening - Comments:: Wears corrective lenses when reading Cataract extraction, bilateral  Dietary issues and exercise activities discussed: Current Exercise Habits: Home exercise routine, Type of exercise: walking, Intensity: Mild Low carb diet FBS 71   Goals Addressed             This Visit's Progress    COMPLETED: Low carb   On track    Maintain Healthy Lifestyle       Walk as tolerated. Healthy diet.       Depression Screen    09/10/2021   12:45 PM 08/12/2021   11:35 AM 05/14/2021   12:01 PM 12/19/2020   11:22 AM 09/05/2020   11:40 AM 03/02/2020   10:19 AM 11/23/2019    1:27 PM  PHQ 2/9 Scores  PHQ - 2 Score 0 0 0 0 0 0 0    Fall Risk    09/10/2021   12:48 PM 08/12/2021   11:35 AM 05/14/2021   12:01 PM 12/19/2020   11:22 AM 09/05/2020   11:45 AM  [place] in the past year? 0 0 0 1 0  Number falls in past yr: 0 0 0 0 0  Injury with Fall?  0 0 0 0  Risk for fall due to :  No Fall Risks No Fall Risks    Follow up Falls  evaluation completed Falls evaluation completed Falls evaluation completed Falls evaluation completed Falls evaluation completed    [place]: Home free of loose throw rugs in walkways, pet beds, electrical cords, etc? Yes  Adequate lighting in your home to reduce risk of falls? Yes   ASSISTIVE DEVICES UTILIZED TO PREVENT FALLS: Life alert? No  Use of a cane, walker or w/c? No   TIMED UP AND GO: Was the test performed? No .   Cognitive Function: Patient is alert and oriented x3.     07/03/2017   10:54 AM 07/02/2016   10:41 AM 07/03/2015   11:01 AM  MMSE - Mini Mental State Exam  Orientation to time '5 5 5  '$ Orientation to Place '5 5 5  '$ Registration '3 3 3  '$ Attention/ Calculation '5 5 5  '$ Recall '3 3 3  '$ Language- name 2 objects '2 2 2  '$ Language- repeat 1  1 1  Language- follow 3 step command '3 3 3  '$ Language- read & follow direction '1 1 1  '$ Write a sentence '1 1 1  '$ Copy design '1 1 1  '$ Total score '30 30 30        '$ 09/10/2021   12:52 PM 09/05/2019    1:30 PM 08/09/2018    4:19 PM  6CIT Screen  What Year? 0 points 0 points 0 points  What month? 0 points 0 points 0 points  What time? 0 points  0 points  Count back from 20 0 points  0 points  Months in reverse 0 points 0 points 0 points  Repeat phrase 0 points  0 points  Total Score 0 points  0 points    Immunizations Immunization History  Administered Date(s) Administered   Fluad Quad(high Dose 65+) 04/21/2019, 03/02/2020, 12/19/2020   Influenza, High Dose Seasonal PF 02/15/2016, 02/16/2017, 03/01/2018   Influenza,inj,Quad PF,6+ Mos 01/04/2013, 02/20/2014, 02/27/2015   Moderna Sars-Covid-2 Vaccination 10/10/2019, 11/07/2019   Pneumococcal Conjugate-13 05/02/2013   Pneumococcal Polysaccharide-23 01/05/2011   Tdap 01/05/2008    TDAP status: Due, Education has been provided regarding the importance of this vaccine. Advised may receive this vaccine at local pharmacy or Health Dept. Aware to provide  a copy of the vaccination record if obtained from local pharmacy or Health Dept. Verbalized acceptance and understanding.  Shingrix Completed?: No.    Education has been provided regarding the importance of this vaccine. Patient has been advised to call insurance company to determine out of pocket expense if they have not yet received this vaccine. Advised may also receive vaccine at local pharmacy or Health Dept. Verbalized acceptance and understanding.  Screening Tests Health Maintenance  Topic Date Due   OPHTHALMOLOGY EXAM  03/12/2021   Zoster Vaccines- Shingrix (1 of 2) 12/11/2021 (Originally 05/27/1964)   TETANUS/TDAP  09/11/2022 (Originally 01/04/2018)   HEMOGLOBIN A1C  11/11/2021   INFLUENZA VACCINE  11/12/2021   FOOT EXAM  08/13/2022   Pneumonia Vaccine 47+ Years old  Completed   Hepatitis C Screening  Completed   HPV VACCINES  Aged Out   COVID-19 Vaccine  Discontinued   Health Maintenance Health Maintenance Due  Topic Date Due   OPHTHALMOLOGY EXAM  03/12/2021   Lung Cancer Screening: (Low Dose CT Chest recommended if Age 30-80 years, 30 pack-year currently smoking OR have quit w/in 15years.) does not qualify.   Vision Screening: Recommended annual ophthalmology exams for early detection of glaucoma and other disorders of the eye.  Dental Screening: Recommended annual dental exams for proper oral hygiene  Community Resource Referral / Chronic Care Management: CRR required this visit?  No   CCM required this visit?  No      Plan:   Keep all routine maintenance appointments.   I have personally reviewed and noted the following in the patient's chart:   Medical and social history Use of alcohol, tobacco or illicit drugs  Current medications and supplements including opioid prescriptions. Patient is currently taking opioid prescriptions. Information provided to patient regarding non-opioid alternatives. Patient advised to discuss non-opioid treatment plan with their  provider. Followed by PCP. Functional ability and status Nutritional status Physical activity Advanced directives List of other physicians Hospitalizations, surgeries, and ER visits in previous 12 months Vitals Screenings to include cognitive, depression, and falls Referrals and appointments  In addition, I have reviewed and discussed with patient certain preventive protocols, quality metrics, and best practice recommendations. A written personalized care plan for preventive services  as well as general preventive health recommendations were provided to patient.     Varney Biles, LPN   [id number]/[id number]

## 2021-09-10 NOTE — Patient Instructions (Addendum)
Douglas [name] , Thank you for taking time to come for your Medicare Wellness Visit. I appreciate your ongoing commitment to your health goals. Please review the following plan we discussed and let me know if I can assist you in the future.   These are the goals we discussed:  Goals       Patient Stated     HEMOGLOBIN A1C < 7 (pt-stated)      Kidney function at 60      Other     Maintain Healthy Lifestyle      Walk as tolerated. Healthy diet.        This is a list of the screening recommended for you and due dates:  Health Maintenance  Topic Date Due   Eye exam for diabetics  03/12/2021   Zoster (Shingles) Vaccine (1 of 2) 12/11/2021*   Tetanus Vaccine  09/11/2022*   Hemoglobin A1C  11/11/2021   Flu Shot  11/12/2021   Complete foot exam   08/13/2022   Pneumonia Vaccine  Completed   Hepatitis C Screening: USPSTF Recommendation to screen - Ages 18-79 yo.  Completed   HPV Vaccine  Aged Out   COVID-19 Vaccine  Discontinued  *Topic was postponed. The date shown is not the original due date.    Opioid Pain Medicine Management Opioids are powerful medicines that are used to treat moderate to severe pain. When used for short periods of time, they can help you to: Sleep better. Do better in physical or occupational therapy. Feel better in the first few days after an injury. Recover from surgery. Opioids should be taken with the supervision of a trained health care provider. They should be taken for the shortest period of time possible. This is because opioids can be addictive, and the longer you take opioids, the greater your risk of addiction. This addiction can also be called opioid use disorder. What are the risks? Using opioid pain medicines for longer than 3 days increases your risk of side effects. Side effects include: Constipation. Nausea and vomiting. Breathing difficulties (respiratory depression). Drowsiness. Confusion. Opioid use disorder. Itching. Taking opioid  pain medicine for a long period of time can affect your ability to do daily tasks. It also puts you at risk for: Motor vehicle crashes. Depression. Suicide. Heart attack. Overdose, which can be life-threatening. What is a pain treatment plan? A pain treatment plan is an agreement between you and your health care provider. Pain is unique to each person, and treatments vary depending on your condition. To manage your pain, you and your health care provider need to work together. To help you do this: Discuss the goals of your treatment, including how much pain you might expect to have and how you will manage the pain. Review the risks and benefits of taking opioid medicines. Remember that a good treatment plan uses more than one approach and minimizes the chance of side effects. Be honest about the amount of medicines you take and about any drug or alcohol use. Get pain medicine prescriptions from only one health care provider. Pain can be managed with many types of alternative treatments. Ask your health care provider to refer you to one or more specialists who can help you manage pain through: Physical or occupational therapy. Counseling (cognitive behavioral therapy). Good nutrition. Biofeedback. Massage. Meditation. Non-opioid medicine. Following a gentle exercise program. How to use opioid pain medicine Taking medicine Take your pain medicine exactly as told by your health care provider. Take it only when  you need it. If your pain gets less severe, you may take less than your prescribed dose if your health care provider approves. If you are not having pain, do nottake pain medicine unless your health care provider tells you to take it. If your pain is severe, do nottry to treat it yourself by taking more pills than instructed on your prescription. Contact your health care provider for help. Write down the times when you take your pain medicine. It is easy to become confused while on  pain medicine. Writing the time can help you avoid overdose. Take other over-the-counter or prescription medicines only as told by your health care provider. Keeping yourself and others safe  While you are taking opioid pain medicine: Do not drive, use machinery, or power tools. Do not sign legal documents. Do not drink alcohol. Do not take sleeping pills. Do not supervise children by yourself. Do not do activities that require climbing or being in high places. Do not go to a lake, river, ocean, spa, or swimming pool. Do not share your pain medicine with anyone. Keep pain medicine in a locked cabinet or in a secure area where pets and children cannot reach it. Stopping your use of opioids If you have been taking opioid medicine for more than a few weeks, you may need to slowly decrease (taper) how much you take until you stop completely. Tapering your use of opioids can decrease your risk of symptoms of withdrawal, such as: Pain and cramping in the abdomen. Nausea. Sweating. Sleepiness. Restlessness. Uncontrollable shaking (tremors). Cravings for the medicine. Do not attempt to taper your use of opioids on your own. Talk with your health care provider about how to do this. Your health care provider may prescribe a step-down schedule based on how much medicine you are taking and how long you have been taking it. Getting rid of leftover pills Do not save any leftover pills. Get rid of leftover pills safely by: Taking the medicine to a prescription take-back program. This is usually offered by the county or law enforcement. Bringing them to a pharmacy that has a drug disposal container. Flushing them down the toilet. Check the label or package insert of your medicine to see whether this is safe to do. Throwing them out in the trash. Check the label or package insert of your medicine to see whether this is safe to do. If it is safe to throw it out, remove the medicine from the original  container, put it into a sealable bag or container, and mix it with used coffee grounds, food scraps, dirt, or cat litter before putting it in the trash. Follow these instructions at home: Activity Do exercises as told by your health care provider. Avoid activities that make your pain worse. Return to your normal activities as told by your health care provider. Ask your health care provider what activities are safe for you. General instructions You may need to take these actions to prevent or treat constipation: Drink enough fluid to keep your urine pale yellow. Take over-the-counter or prescription medicines. Eat foods that are high in fiber, such as beans, whole grains, and fresh fruits and vegetables. Limit foods that are high in fat and processed sugars, such as fried or sweet foods. Keep all follow-up visits. This is important. Where to find support If you have been taking opioids for a long time, you may benefit from receiving support for quitting from a local support group or counselor. Ask your health care provider  for a referral to these resources in your area. Where to find more information Centers for Disease Control and Prevention (CDC): http://www.wolf.info/ U.[place]. Food and Drug Administration (FDA): [place]amGaming.ch Get help right away if: You may have taken too much of an opioid (overdosed). Common symptoms of an overdose: Your breathing is slower or more shallow than normal. You have a very slow heartbeat (pulse). You have slurred speech. You have nausea and vomiting. Your pupils become very small. You have other potential symptoms: You are very confused. You faint or feel like you will faint. You have cold, clammy skin. You have blue lips or fingernails. You have thoughts of harming yourself or harming others. These symptoms may represent a serious problem that is an emergency. Do not wait to see if the symptoms will go away. Get medical help right away. Call your local emergency  services (911 in the [place] Do not drive yourself to the hospital.  If you ever feel like you may hurt yourself or others, or have thoughts about taking your own life, get help right away. Go to your nearest emergency department or: Call your local emergency services (911 in the [place] Call the [facility] (343)598-0973 in the [place] Call a suicide crisis helpline, such as the [facility] at 587-376-0543 or 988 in the [place]. This is open 24 hours a day in the [place] Text the Crisis Text Line at 339-605-9568 (in the [place].). Summary Opioid medicines can help you manage moderate to severe pain for a short period of time. A pain treatment plan is an agreement between you and your health care provider. Discuss the goals of your treatment, including how much pain you might expect to have and how you will manage the pain. If you think that you or someone else may have taken too much of an opioid, get medical help right away. This information is not intended to replace advice given to you by your health care provider. Make sure you discuss any questions you have with your health care provider. Document Revised: 10/24/2020 Document Reviewed: 07/11/2020 Elsevier Patient Education  [place].

## 2021-09-11 ENCOUNTER — Ambulatory Visit (INDEPENDENT_AMBULATORY_CARE_PROVIDER_SITE_OTHER): Payer: Medicare Other | Admitting: Cardiovascular Disease

## 2021-09-11 ENCOUNTER — Telehealth: Payer: Self-pay

## 2021-09-11 ENCOUNTER — Encounter: Payer: Self-pay | Admitting: Cardiovascular Disease

## 2021-09-11 VITALS — BP 138/70 | HR 77 | Ht 72.0 in | Wt 256.6 lb

## 2021-09-11 DIAGNOSIS — R002 Palpitations: Secondary | ICD-10-CM

## 2021-09-11 DIAGNOSIS — E785 Hyperlipidemia, unspecified: Secondary | ICD-10-CM

## 2021-09-11 DIAGNOSIS — R42 Dizziness and giddiness: Secondary | ICD-10-CM | POA: Diagnosis not present

## 2021-09-11 DIAGNOSIS — I10 Essential (primary) hypertension: Secondary | ICD-10-CM

## 2021-09-11 NOTE — Telephone Encounter (Signed)
I called and spoke with the patient and informed him his medication arrived and is available for pickup and he understood and will pick it up. Arias Weinert,cma

## 2021-09-11 NOTE — Patient Instructions (Signed)
Medication Instructions:   Your physician recommends that you continue on your current medications as directed. Please refer to the Current Medication list given to you today.   *If you need a refill on your cardiac medications before your next appointment, please call your pharmacy*   Lab Work: None ordered   If you have labs (blood work) drawn today and your tests are completely normal, you will receive your results only by: [place] (if you have MyChart) OR A paper copy in the mail If you have any lab test that is abnormal or we need to change your treatment, we will call you to review the results.   Testing/Procedures: None ordered   Follow-Up: At [facility], you and your health needs are our priority.  As part of our continuing mission to provide you with exceptional heart care, we have created designated Provider Care Teams.  These Care Teams include your primary Cardiologist (physician) and Advanced Practice Providers (APPs -  Physician Assistants and Nurse Practitioners) who all work together to provide you with the care you need, when you need it.  We recommend signing up for the patient portal called "MyChart".  Sign up information is provided on this After Visit Summary.  MyChart is used to connect with patients for Virtual Visits (Telemedicine).  Patients are able to view lab/test results, encounter notes, upcoming appointments, etc.  Non-urgent messages can be sent to your provider as well.   To learn more about what you can do with MyChart, go to [email or web address].    Your next appointment:   As needed  The format for your next appointment:   In Person  Provider:   You may see Kathlyn Sacramento, MD or one of the following Advanced Practice Providers on your designated Care Team:   Murray Hodgkins, NP Christell Faith, PA-C Cadence Kathlen Mody, [name]   Other Instructions N/A  Important Information About Sugar

## 2021-09-11 NOTE — Progress Notes (Signed)
Cardiology Office Note   Date:  09/11/2021   ID:  Douglas [name], DOB [birth year], MRN [id number]  PCP:  [name], MD  Cardiologist:   Kathlyn Sacramento, MD   Chief Complaint  Patient presents with   Follow-up    12 month follow up. Meds reviewed verbally with the patient.       History of Present Illness: Douglas [name] is a 76 y.o. male who is here today for a follow-up visit regarding dizziness with syncope thought to be due to orthostatic hypotension while on high-dose Cardura .    The patient reports history of abnormal stress test in 1998 which was followed by cardiac catheterization that showed no obstructive disease.  It was done by Dr. Rex Kras.  He has multiple chronic medical conditions that include hyperlipidemia, essential hypertension that started in his early 81s, type 2 diabetes of at least 10 years duration, chronic kidney disease with prior acute renal failure, obesity and BPH.  Orthostatic dizziness improved after decreasing Cardura from 8 mg to 4 mg daily.  The patient preferred to stay on this medication due to significant BPH.  Nuclear stress testing in 2019 showed no evidence of ischemia with normal ejection fraction.  Echocardiogram in 2019 showed normal LV systolic function and no evidence of pulmonary hypertension.   Outpatient monitor in 2019 showed sinus rhythm with few short runs of SVT.  He has been doing well with no recent chest pain, shortness of breath or palpitations.  He reports only mild dizziness when he stands up quickly.  Past Medical History:  Diagnosis Date   Arthritis    Depression    Diabetes mellitus with complication (HCC)    Diabetic neuropathy (HCC)    Diastolic dysfunction    a. TTE 07/2017: EF 60-65%, mild concentric LVH, no RWMA, Gr1DD, trivial AI, mildly dilated LA, RVSF normal, PASP normal   Edema    feet/legs   GERD (gastroesophageal reflux disease)    Gout    History of stress test    a. MV 06/2017: small in  size, mild in severity, apical anterior and apical defect that was minimally reversible and most likely represented artifact and less likely ischemia/scar, LVEF 55-65%, low risk, probably normal stress test   Hypertension    Hypothyroidism    Kidney cysts    renal failure 2013   Kidney stones    Dr. Rogers Blocker   OSA (obstructive sleep apnea)    supplemental oxygen at night   Oxygen dependent    hs   [facility] spotted fever 12/10/2017   Positve IgG in titer on 11/20/17   S/P cardiac cath 1998   a. no obstructive disease   Syncope and collapse    Temporary low platelet count ([place])     Past Surgical History:  Procedure Laterality Date   ANAL FISSURE REPAIR     BACK SURGERY  1977   rupture disc lumbar spine   [facility].    CATARACT EXTRACTION W/PHACO Right 07/16/2016   Procedure: CATARACT EXTRACTION PHACO AND INTRAOCULAR LENS PLACEMENT (IOC);  Surgeon: [name], MD;  Location: [facility] ORS;  Service: Ophthalmology;  Laterality: Right;  [place] 01:11 AP% 17.6 CDE 24.83 fluid pack lot # [id number]   CATARACT EXTRACTION W/PHACO Left 08/13/2016   Procedure: CATARACT EXTRACTION PHACO AND INTRAOCULAR LENS PLACEMENT (IOC) suture placed in left eye at end of procedure;  Surgeon: [name], MD;  Location: [facility] ORS;  Service: Ophthalmology;  Laterality: Left;  [place] 01:55 AP% 22.7 CDE 53.56 fluid pack lot # [id number]   FINGER AMPUTATION     partial   KNEE SURGERY  1993   arthroscopy   SHOULDER SURGERY Bilateral 1998   arthroscopic right, rotator cuff repair left     Current Outpatient Medications  Medication Sig Dispense Refill   acetaminophen (TYLENOL) 500 MG tablet Take 500 mg by mouth every 6 (six) hours as needed.     allopurinol (ZYLOPRIM) 300 MG tablet TAKE 1 TABLET(300 MG) BY MOUTH DAILY 90 tablet 1   aspirin 81 MG tablet Take 1 tablet (81 mg total) by mouth daily.     B Complex Vitamins (BL VITAMIN B  COMPLEX PO) Take by mouth daily.     calcitRIOL (ROCALTROL) 0.25 MCG capsule Take 0.25 mcg by mouth daily.   5   carvedilol (COREG) 6.25 MG tablet TAKE 1 TABLET(6.25 MG) BY MOUTH TWICE DAILY WITH A MEAL 180 tablet 0   Cholecalciferol (VITAMIN D3 PO) Take by mouth daily.     clotrimazole-betamethasone (LOTRISONE) cream Apply 1 application. topically 2 (two) times daily. 45 g 1   clove oil liquid Apply 1 application topically as needed.     cyclobenzaprine (FLEXERIL) 10 MG tablet Take 1 tablet by mouth as needed.     dapagliflozin propanediol ([name]) 10 MG TABS tablet Take 1 tablet (10 mg total) by mouth daily. 90 tablet 3   doxazosin (CARDURA) 4 MG tablet TAKE 1 TABLET(4 MG) BY MOUTH DAILY 90 tablet 1   DULoxetine (CYMBALTA) 60 MG capsule TAKE 1 CAPSULE(60 MG) BY MOUTH DAILY 90 capsule 1   esomeprazole (NEXIUM) 40 MG capsule Take 1 capsule (40 mg total) by mouth daily at 12 [name]. 90 capsule 3   finasteride (PROSCAR) 5 MG tablet TAKE 1 TABLET(5 MG) BY MOUTH AT BEDTIME 90 tablet 3   fluticasone (FLONASE) 50 MCG/ACT nasal spray Place 2 sprays into both nostrils daily. 16 g 6   furosemide (LASIX) 40 MG tablet TAKE 1 TABLET(40 MG) BY MOUTH DAILY 90 tablet 1   gentamicin cream (GARAMYCIN) 0.1 % Apply 1 application. topically 2 (two) times daily. 30 g 1   HYDROcodone-acetaminophen (NORCO/VICODIN) 5-325 MG tablet Take 1 tablet by mouth every 6 (six) hours as needed for moderate pain. 30 tablet 0   insulin degludec ([name] FLEXTOUCH) 200 UNIT/ML FlexTouch Pen Inject 44 Units into the skin daily. 15 mL 0   Insulin Pen Needle (NOVOFINE PEN NEEDLE) 32G X 6 MM MISC 1 each by Does not apply route daily. 100 each 3   levothyroxine (SYNTHROID) 100 MCG tablet TAKE 1 TABLET(100 MCG) BY MOUTH DAILY BEFORE AND BREAKFAST 90 tablet 0   loratadine (CLARITIN) 10 MG tablet Take 10 mg by mouth daily as needed for allergies.     losartan (COZAAR) 25 MG tablet TAKE 1 TABLET BY MOUTH EVERY MORNING THEN TAKE 2 TABLETS BY  MOUTH EVERY EVENING 270 tablet 0   MAGNESIUM PO Take by mouth daily at 8 pm.     meloxicam (MOBIC) 15 MG tablet Take 1 tablet (15 mg total) by mouth daily. 30 tablet 1   metFORMIN (GLUCOPHAGE-XR) 500 MG 24 hr tablet Take 2 tablets (1,000 mg total) by mouth 2 (two) times daily. 360 tablet 3   Multiple Vitamins-Minerals (MULTIVITAMIN MEN PO) Take 1 tablet by mouth daily.     pregabalin (LYRICA) 200 MG capsule TAKE 1 CAPSULE(200 MG) BY MOUTH TWICE DAILY 180 capsule 1  Semaglutide, 1 MG/DOSE, (OZEMPIC, 1 MG/DOSE,) 4 MG/3ML SOPN Inject 1 mg into the skin once a week. 3 mL 3   simvastatin (ZOCOR) 40 MG tablet TAKE 1 TABLET(40 MG) BY MOUTH AT BEDTIME 90 tablet 3   sulfamethoxazole-trimethoprim (BACTRIM DS) 800-160 MG tablet Take 1 tablet by mouth 2 (two) times daily. 14 tablet 0   tadalafil (CIALIS) 20 MG tablet Take by mouth.     testosterone cypionate (DEPOTESTOSTERONE CYPIONATE) 200 MG/ML injection Inject 200 mg into the muscle every 14 (fourteen) days.     No current facility-administered medications for this visit.    Allergies:   Bee venom, Dye fdc red [red dye], Omeprazole, Atenolol, Benadryl [diphenhydramine hcl (sleep)], Enalapril, Gabapentin, and Hctz [hydrochlorothiazide]    Social History:  The patient  reports that he has never smoked. He has never used smokeless tobacco. He reports that he does not drink alcohol and does not use drugs.   Family History:  The patient's family history includes AAA (abdominal aortic aneurysm) in his mother; Bone cancer in his mother; Breast cancer in his mother; Cancer in his brother and mother; Heart Problems in his mother; Heart attack in his brother and father; Heart disease in his father and mother; Lung cancer in his mother.    ROS:  Please see the history of present illness.   Otherwise, review of systems are positive for none.   All other systems are reviewed and negative.    PHYSICAL EXAM: VS:  BP 138/70 (BP Location: Left Arm, Patient  Position: Sitting, Cuff Size: Normal)   Pulse 77   Ht 6' (1.829 m)   Wt 256 lb 9.6 oz (116.4 kg)   SpO2 98%   BMI 34.80 kg/m  , BMI Body mass index is 34.8 kg/m. GEN: Well nourished, well developed, in no acute distress  HEENT: normal  Neck: no JVD, carotid bruits, or masses Cardiac: RRR; no  rubs, or gallops, mild bilateral edema .  2 / 6 systolic murmur in the aortic area Respiratory:  clear to auscultation bilaterally, normal work of breathing GI: soft, nontender, nondistended, + BS MS: no deformity or atrophy  Skin: warm and dry, no rash Neuro:  Strength and sensation are intact Psych: euthymic mood, full affect   EKG:  EKG is ordered today. The ekg ordered today demonstrates sinus rhythm with first-degree AV block, LVH with repolarization abnormalities.  Recent Labs: 12/19/2020: ALT 9; BUN 11; Creatinine, Ser 1.31; Potassium 4.3; Sodium 137 05/14/2021: TSH 1.19    Lipid Panel    Component Value Date/Time   CHOL 101 12/19/2020 1156   CHOL 142 11/12/2018 1142   TRIG 229.0 (H) 12/19/2020 1156   HDL 29.70 (L) 12/19/2020 1156   HDL 49 11/12/2018 1142   CHOLHDL 3 12/19/2020 1156   VLDL 45.8 (H) 12/19/2020 1156   LDLCALC 40 11/23/2019 1350   LDLCALC 58 11/12/2018 1142   LDLDIRECT 40.0 12/19/2020 1156      Wt Readings from Last 3 Encounters:  09/11/21 256 lb 9.6 oz (116.4 kg)  09/10/21 251 lb (113.9 kg)  08/12/21 251 lb (113.9 kg)          06/25/2017    2:02 PM  PAD Screen  Previous PAD dx? No  Previous surgical procedure? No  Pain with walking? No  Feet/toe relief with dangling? No  Painful, non-healing ulcers? No  Extremities discolored? No      ASSESSMENT AND PLAN:  1.  Orthostatic dizziness and syncope: His symptoms have been stable over  the last 2 years.  He reports only mild dizziness if he stands up quickly.  2.  Essential hypertension: Blood pressure is reasonably controlled on current medications.  3.   Hyperlipidemia: Given that he is  diabetic, recommended target LDL of less than 70.  He is currently on simvastatin 40 mg once daily.  Most recent lipid profile showed an LDL of 40.  4.  Mild palpitations: Monitor showed only short runs of SVT.  Symptoms are well controlled with carvedilol.    Disposition:   FU as needed.  Signed,  Kathlyn Sacramento, MD  09/11/2021 11:28 AM    [place]

## 2021-09-20 ENCOUNTER — Ambulatory Visit: Payer: PPO | Admitting: Podiatry

## 2021-09-20 DIAGNOSIS — L308 Other specified dermatitis: Secondary | ICD-10-CM

## 2021-09-20 NOTE — Progress Notes (Signed)
HPI: 76 y.o. male presenting today for follow-up evaluation of symptomatic vesicular lesions to the left hallux.  Last visit about 3 weeks ago Lotrisone cream and gentamicin cream was sent to the pharmacy with instructions to apply a 50-50 mixture and apply 2 times daily.  Patient states that this is helped significantly.  He presents for further treatment evaluation  Past Medical History:  Diagnosis Date   Arthritis    Depression    Diabetes mellitus with complication ([place])    Diabetic neuropathy ([place])    Diastolic dysfunction    a. TTE 07/2017: EF 60-65%, mild concentric LVH, no RWMA, Gr1DD, trivial AI, mildly dilated LA, RVSF normal, PASP normal   Edema    feet/legs   GERD (gastroesophageal reflux disease)    Gout    History of stress test    a. MV 06/2017: small in size, mild in severity, apical anterior and apical defect that was minimally reversible and most likely represented artifact and less likely ischemia/scar, LVEF 55-65%, low risk, probably normal stress test   Hypertension    Hypothyroidism    Kidney cysts    renal failure 2013   Kidney stones    Dr. Rogers Blocker   OSA (obstructive sleep apnea)    supplemental oxygen at night   Oxygen dependent    hs   [facility] spotted fever 12/10/2017   Positve IgG in titer on 11/20/17   S/P cardiac cath 1998   a. no obstructive disease   Syncope and collapse    Temporary low platelet count ([place])     Past Surgical History:  Procedure Laterality Date   ANAL FISSURE REPAIR     BACK SURGERY  1977   rupture disc lumbar spine   [facility].    CATARACT EXTRACTION W/PHACO Right 07/16/2016   Procedure: CATARACT EXTRACTION PHACO AND INTRAOCULAR LENS PLACEMENT (IOC);  Surgeon: [name], MD;  Location: [facility] ORS;  Service: Ophthalmology;  Laterality: Right;  [place] 01:11 AP% 17.6 CDE 24.83 fluid pack lot # [id number]   CATARACT EXTRACTION W/PHACO Left 08/13/2016    Procedure: CATARACT EXTRACTION PHACO AND INTRAOCULAR LENS PLACEMENT ([place]) suture placed in left eye at end of procedure;  Surgeon: [name], MD;  Location: [facility] ORS;  Service: Ophthalmology;  Laterality: Left;  [place] 01:55 AP% 22.7 CDE 53.56 fluid pack lot # [id number]   FINGER AMPUTATION     partial   KNEE SURGERY  1993   arthroscopy   SHOULDER SURGERY Bilateral 1998   arthroscopic right, rotator cuff repair left    Allergies  Allergen Reactions   Bee Venom Swelling   Dye Fdc Red [Red Dye] Swelling and Other (See Comments)    Reaction: gout   Omeprazole     Other reaction(s): increased sx on high dose   Atenolol Other (See Comments)    Did not regulate blood pressure   Benadryl [Diphenhydramine Hcl (Sleep)] Other (See Comments)    Hyperactivity    Enalapril Itching    itching   Gabapentin Palpitations and Rash    rash   Hctz [Hydrochlorothiazide] Other (See Comments)    Decreased potassium     Physical Exam: General: The patient is alert and oriented x3 in no acute distress.  Dermatology: Skin is warm, dry and supple bilateral lower extremities. Negative for open lesions or macerations.  Hyperkeratotic callus noted especially to the left hallux with vesicular lesions.  Overall there is significant improvement.  The skin is dry in the area and there is no visible drainage.  Nails are elongated hyperkeratotic 1-5 bilateral  Vascular: Palpable pedal pulses bilaterally. Capillary refill within normal limits.  Negative for any significant edema or erythema  Neurological: Light touch and protective threshold grossly intact  Musculoskeletal Exam: No pedal deformities noted   Assessment: 1.  Vesicular dermatitis/possible tinea pedis left foot; improved 2.  Pain due to onychomycosis of toenails both   Plan of Care:  1. Patient evaluated.  Light debridement of the hyperkeratotic callus tissue and skin as well as the vesicular lesions was performed today 2.  Continue the  gentamicin and Lotrisone cream combined in a 50-50 mixture and apply 2 times daily.  This seems to provide significant relief and improvement of the lesions 3.  Debridement of nails 1-5 bilateral was performed using a nail nipper without incident or bleeding 4.  Return to clinic 3 months     Edrick Kins, DPM [facility]  Dr. Edrick Kins, DPM    [address], [place] [address]                Office [phone]  Fax [phone]

## 2021-09-21 ENCOUNTER — Other Ambulatory Visit: Payer: Self-pay | Admitting: Family Medicine

## 2021-10-09 ENCOUNTER — Other Ambulatory Visit: Payer: Self-pay | Admitting: Family Medicine

## 2021-10-09 ENCOUNTER — Other Ambulatory Visit: Payer: Self-pay | Admitting: Family

## 2021-10-09 DIAGNOSIS — E1165 Type 2 diabetes mellitus with hyperglycemia: Secondary | ICD-10-CM

## 2021-10-16 ENCOUNTER — Other Ambulatory Visit: Payer: Self-pay | Admitting: Family Medicine

## 2021-10-23 ENCOUNTER — Other Ambulatory Visit: Payer: Self-pay | Admitting: Family Medicine

## 2021-10-23 DIAGNOSIS — E039 Hypothyroidism, unspecified: Secondary | ICD-10-CM

## 2021-11-14 DIAGNOSIS — N1831 Chronic kidney disease, stage 3a: Secondary | ICD-10-CM | POA: Diagnosis not present

## 2021-11-14 DIAGNOSIS — E1122 Type 2 diabetes mellitus with diabetic chronic kidney disease: Secondary | ICD-10-CM | POA: Diagnosis not present

## 2021-11-14 DIAGNOSIS — N2581 Secondary hyperparathyroidism of renal origin: Secondary | ICD-10-CM | POA: Diagnosis not present

## 2021-11-14 DIAGNOSIS — D631 Anemia in chronic kidney disease: Secondary | ICD-10-CM | POA: Diagnosis not present

## 2021-11-14 DIAGNOSIS — I10 Essential (primary) hypertension: Secondary | ICD-10-CM | POA: Diagnosis not present

## 2021-11-18 ENCOUNTER — Ambulatory Visit (INDEPENDENT_AMBULATORY_CARE_PROVIDER_SITE_OTHER): Payer: PPO | Admitting: Family Medicine

## 2021-11-18 ENCOUNTER — Encounter: Payer: Self-pay | Admitting: Family Medicine

## 2021-11-18 DIAGNOSIS — I10 Essential (primary) hypertension: Secondary | ICD-10-CM

## 2021-11-18 DIAGNOSIS — L989 Disorder of the skin and subcutaneous tissue, unspecified: Secondary | ICD-10-CM

## 2021-11-18 DIAGNOSIS — M67912 Unspecified disorder of synovium and tendon, left shoulder: Secondary | ICD-10-CM | POA: Diagnosis not present

## 2021-11-18 DIAGNOSIS — J989 Respiratory disorder, unspecified: Secondary | ICD-10-CM | POA: Diagnosis not present

## 2021-11-18 DIAGNOSIS — E1142 Type 2 diabetes mellitus with diabetic polyneuropathy: Secondary | ICD-10-CM | POA: Diagnosis not present

## 2021-11-18 MED ORDER — TRESIBA FLEXTOUCH 200 UNIT/ML ~~LOC~~ SOPN: 39.0000 [IU] | PEN_INJECTOR | Freq: Every day | SUBCUTANEOUS | 0 refills | Status: DC

## 2021-11-18 NOTE — Patient Instructions (Signed)
Nice to see you. We are going to reduce your [name] to 39 units daily given how well controlled your A1c is. Please make sure you call [facility] to schedule a visit. Please add Claritin over-the-counter.  If your symptoms are not improving over the next week or 2 please let me know.  If you have any worsening symptoms please let me know.

## 2021-11-18 NOTE — Progress Notes (Signed)
[name], MD Phone: [phone]  Douglas [name] is a 76 y.o. male who presents today for follow-up.  Diabetes: Patient has not been checking his sugars given the cost of supplies.  He is taking [place], [name], metformin, and Ozempic.  No polyuria or polydipsia.  No hypoglycemia.  He is due to see ophthalmology.  Hypertension: Typically 120/80.  He is taking Cardura and Lasix.  No chest pain.  No shortness of breath prior to his recent respiratory illness.  Respiratory illness: Patient notes this started 3 weeks ago.  Started with cough and chest congestion.  Has had some head congestion.  He has postnasal drip and is blowing clear mucus out of his nose.  He had a sore throat to start with.  He has had some mild shortness of breath.  He notes no sneezing.  He had a negative home COVID test.  He feels about two thirds better than he did at the start.  He does report some light sensitivity and clear eye drainage.  He notes no eye pain.  No vision changes.  Neuropathy: Patient continues on Lyrica.  He has not required any recent hydrocodone.  He is using Salonpas and delta 8 Gummies.  He has been taking 1/8 of a gummy only when he has bad pain.  Left shoulder pain: Patient reports discomfort going on for some time with some weakness in his left shoulder.  Notes discomfort if he tries to abduct his shoulder.  Notes he can only lift it so high.  He reports several rotator cuff tears in the past.  He has seen orthopedics in the past for his rotator cuff.  Skin lesion: Patient notes this is on his upper back.  He notes this has been itching a lot recently and has been scratching at it.  He does see dermatology in November.  Social History   Tobacco Use  Smoking Status Never  Smokeless Tobacco Never    Current Outpatient Medications on File Prior to Visit  Medication Sig Dispense Refill   acetaminophen (TYLENOL) 500 MG tablet Take 500 mg by mouth every 6 (six) hours as needed.      allopurinol (ZYLOPRIM) 300 MG tablet TAKE 1 TABLET(300 MG) BY MOUTH DAILY 90 tablet 1   aspirin 81 MG tablet Take 1 tablet (81 mg total) by mouth daily.     B Complex Vitamins (BL VITAMIN B COMPLEX PO) Take by mouth daily.     calcitRIOL (ROCALTROL) 0.25 MCG capsule Take 0.25 mcg by mouth daily.   5   carvedilol (COREG) 6.25 MG tablet TAKE 1 TABLET(6.25 MG) BY MOUTH TWICE DAILY WITH A MEAL 180 tablet 0   Cholecalciferol (VITAMIN D3 PO) Take by mouth daily.     clotrimazole-betamethasone (LOTRISONE) cream Apply 1 application. topically 2 (two) times daily. 45 g 1   clove oil liquid Apply 1 application topically as needed.     cyclobenzaprine (FLEXERIL) 10 MG tablet Take 1 tablet by mouth as needed.     dapagliflozin propanediol ([name]) 10 MG TABS tablet Take 1 tablet (10 mg total) by mouth daily. 90 tablet 3   doxazosin (CARDURA) 4 MG tablet TAKE 1 TABLET(4 MG) BY MOUTH DAILY 90 tablet 1   DULoxetine (CYMBALTA) 60 MG capsule TAKE 1 CAPSULE(60 MG) BY MOUTH DAILY 90 capsule 1   esomeprazole (NEXIUM) 40 MG capsule Take 1 capsule (40 mg total) by mouth daily at 12 noon. 90 capsule 3   finasteride (PROSCAR) 5 MG tablet TAKE 1 TABLET(5  MG) BY MOUTH AT BEDTIME 90 tablet 3   fluticasone (FLONASE) 50 MCG/ACT nasal spray Place 2 sprays into both nostrils daily. 16 g 6   furosemide (LASIX) 40 MG tablet TAKE 1 TABLET(40 MG) BY MOUTH DAILY 90 tablet 1   gentamicin cream (GARAMYCIN) 0.1 % Apply 1 application. topically 2 (two) times daily. 30 g 1   HYDROcodone-acetaminophen (NORCO/VICODIN) 5-325 MG tablet Take 1 tablet by mouth every 6 (six) hours as needed for moderate pain. 30 tablet 0   levothyroxine (SYNTHROID) 100 MCG tablet TAKE 1 TABLET(100 MCG) BY MOUTH DAILY BEFORE BREAKFAST 90 tablet 1   loratadine (CLARITIN) 10 MG tablet Take 10 mg by mouth daily as needed for allergies.     losartan (COZAAR) 25 MG tablet TAKE 1 TABLET BY MOUTH EVERY MORNING THEN TAKE 2 TABLETS BY MOUTH EVERY EVENING 270 tablet 0    MAGNESIUM PO Take by mouth daily at 8 pm.     meloxicam (MOBIC) 15 MG tablet Take 1 tablet (15 mg total) by mouth daily. 30 tablet 1   metFORMIN (GLUCOPHAGE-XR) 500 MG 24 hr tablet TAKE 2 TABLETS(1000 MG) BY MOUTH TWICE DAILY 360 tablet 3   Multiple Vitamins-Minerals (MULTIVITAMIN MEN PO) Take 1 tablet by mouth daily.     pregabalin (LYRICA) 200 MG capsule TAKE 1 CAPSULE(200 MG) BY MOUTH TWICE DAILY 180 capsule 1   Semaglutide, 1 MG/DOSE, (OZEMPIC, 1 MG/DOSE,) 4 MG/3ML SOPN Inject 1 mg into the skin once a week. 3 mL 3   simvastatin (ZOCOR) 40 MG tablet TAKE 1 TABLET(40 MG) BY MOUTH AT BEDTIME 90 tablet 3   sulfamethoxazole-trimethoprim (BACTRIM DS) 800-160 MG tablet Take 1 tablet by mouth 2 (two) times daily. 14 tablet 0   tadalafil (CIALIS) 20 MG tablet Take by mouth.     testosterone cypionate (DEPOTESTOSTERONE CYPIONATE) 200 MG/ML injection Inject 200 mg into the muscle every 14 (fourteen) days.     No current facility-administered medications on file prior to visit.     ROS see history of present illness  Objective  Physical Exam Vitals:   11/18/21 1138  BP: 122/80  Pulse: 77  Temp: 97.9 F (36.6 C)  SpO2: 95%    BP Readings from Last 3 Encounters:  11/18/21 122/80  09/11/21 138/70  09/10/21 129/71   Wt Readings from Last 3 Encounters:  11/18/21 253 lb (114.8 kg)  09/11/21 256 lb 9.6 oz (116.4 kg)  09/10/21 251 lb (113.9 kg)    Physical Exam Constitutional:      General: He is not in acute distress.    Appearance: He is not diaphoretic.  HENT:     Head: Normocephalic and atraumatic.     Ears:     Comments: Left TM normal, there is cerumen in the left ear canal, right TM obscured by cerumen    Mouth/Throat:     Mouth: Mucous membranes are moist.     Pharynx: Oropharynx is clear.  Eyes:     Pupils: Pupils are equal, round, and reactive to light.     Comments: Mild conjunctival erythema bilaterally with clear eye drainage  Cardiovascular:     Rate and  Rhythm: Normal rate and regular rhythm.     Heart sounds: Normal heart sounds.  Pulmonary:     Effort: Pulmonary effort is normal.     Breath sounds: Normal breath sounds.  Musculoskeletal:     Comments: Left shoulder with no tenderness, patient is only able to abduct this to about 70 degrees without discomfort,  he has full range of motion passively  Skin:    General: Skin is warm and dry.       Neurological:     Mental Status: He is alert.      Assessment/Plan: Please see individual problem list.  Problem List Items Addressed This Visit     Diabetic neuropathy ([place]) (Chronic)    Stable.  Discussed that we do not know how CBD interacts with other medications and I do not know how safe CBD is.  Discussed that CBD is not regulated and thus could have other substances in it.  Discussed that typically do not recommend CBD though if he is going to use this he needs to be extremely cautious.      Relevant Medications   insulin degludec ([name] FLEXTOUCH) 200 UNIT/ML FlexTouch Pen   DM type 2 with diabetic peripheral neuropathy (HCC) (Chronic)    A1c is well controlled on most recent labs through nephrology.  He will continue Ozempic 1 mg weekly, metformin XR 1000 mg twice daily, and [name] 10 mg daily.  We will have him reduce his Tyler Aas to 39 units daily given how well controlled his A1c is.      Relevant Medications   insulin degludec ([name] FLEXTOUCH) 200 UNIT/ML FlexTouch Pen   Essential hypertension (Chronic)    Adequately controlled.  He will continue Lasix 40 mg daily and Cardura 4 mg daily.      Respiratory illness    Likely viral in nature though could have some allergy component as well.  He has a benign exam today.  Discussed that antibiotics would not be warranted.  He can continue Nasonex.  He we will add Claritin over-the-counter.  If not improving he will let us know.      Skin lesion    Lesion appears to be an excoriated seborrheic keratosis.  He will contact  his dermatologist to see if they can schedule a sooner appointment for removal given persistent irritation.      Tendinopathy of left rotator cuff    The patient will contact his orthopedic office to schedule an evaluation.        Return in about 3 months (around 02/18/2022).   [name], MD [place]

## 2021-11-18 NOTE — Assessment & Plan Note (Signed)
Adequately controlled.  He will continue Lasix 40 mg daily and Cardura 4 mg daily.

## 2021-11-18 NOTE — Assessment & Plan Note (Signed)
Lesion appears to be an excoriated seborrheic keratosis.  He will contact his dermatologist to see if they can schedule a sooner appointment for removal given persistent irritation.

## 2021-11-18 NOTE — Assessment & Plan Note (Signed)
The patient will contact his orthopedic office to schedule an evaluation.

## 2021-11-18 NOTE — Assessment & Plan Note (Signed)
Likely viral in nature though could have some allergy component as well.  He has a benign exam today.  Discussed that antibiotics would not be warranted.  He can continue Nasonex.  He we will add Claritin over-the-counter.  If not improving he will let us know.

## 2021-11-18 NOTE — Assessment & Plan Note (Signed)
A1c is well controlled on most recent labs through nephrology.  He will continue Ozempic 1 mg weekly, metformin XR 1000 mg twice daily, and [name] 10 mg daily.  We will have him reduce his Tyler Aas to 39 units daily given how well controlled his A1c is.

## 2021-11-18 NOTE — Assessment & Plan Note (Signed)
Stable.  Discussed that we do not know how CBD interacts with other medications and I do not know how safe CBD is.  Discussed that CBD is not regulated and thus could have other substances in it.  Discussed that typically do not recommend CBD though if he is going to use this he needs to be extremely cautious.

## 2021-12-02 ENCOUNTER — Other Ambulatory Visit: Payer: Self-pay | Admitting: Family Medicine

## 2021-12-02 DIAGNOSIS — I10 Essential (primary) hypertension: Secondary | ICD-10-CM

## 2021-12-03 ENCOUNTER — Other Ambulatory Visit: Payer: Self-pay | Admitting: Family Medicine

## 2021-12-11 ENCOUNTER — Telehealth: Payer: Self-pay

## 2021-12-11 NOTE — Telephone Encounter (Signed)
Pt has picked up medication.  

## 2021-12-11 NOTE — Telephone Encounter (Signed)
I called the patient and informed him that his Douglas [name] has arrived from patient assistance and is ready for pickup and he understood.  Mechel Schutter,cma

## 2021-12-17 ENCOUNTER — Telehealth: Payer: Self-pay | Admitting: Podiatry

## 2021-12-17 NOTE — Telephone Encounter (Signed)
Received a message to cxl an appt for pt on Friday @ 1015 but no dob/phone number was left.  I called pt and left message for pt to call to confirm he is wanting to cancel appt.

## 2021-12-19 ENCOUNTER — Telehealth: Payer: Self-pay

## 2021-12-19 NOTE — Telephone Encounter (Signed)
Called pt to inform that we received his ozempic medication in ofc & is welcome to come p/u medicine M-F 8-5. Unable to reach nor lvm, due to vm not being set up.  Medicine placed in pt assistance fridge.

## 2021-12-20 ENCOUNTER — Ambulatory Visit: Payer: PPO | Admitting: Podiatry

## 2021-12-26 ENCOUNTER — Ambulatory Visit: Payer: Medicare Other | Admitting: Urology

## 2022-01-09 ENCOUNTER — Other Ambulatory Visit: Payer: Self-pay | Admitting: Family Medicine

## 2022-01-09 NOTE — Telephone Encounter (Signed)
Refilled: 07/05/2021 Last OV: 11/18/2021 Next OV: 02/19/2022

## 2022-01-10 ENCOUNTER — Ambulatory Visit: Payer: PPO | Admitting: Podiatry

## 2022-01-17 ENCOUNTER — Ambulatory Visit: Payer: PPO | Admitting: Podiatry

## 2022-01-20 ENCOUNTER — Other Ambulatory Visit: Payer: Self-pay | Admitting: Family Medicine

## 2022-01-20 ENCOUNTER — Ambulatory Visit
Admission: RE | Admit: 2022-01-20 | Discharge: 2022-01-20 | Disposition: A | Discharge status: Home / Self Care | Payer: PPO | Source: Ambulatory Visit | Attending: Urology | Admitting: Urology

## 2022-01-20 DIAGNOSIS — N2889 Other specified disorders of kidney and ureter: Secondary | ICD-10-CM | POA: Diagnosis not present

## 2022-01-20 DIAGNOSIS — N281 Cyst of kidney, acquired: Secondary | ICD-10-CM | POA: Diagnosis not present

## 2022-01-20 MED ORDER — GADOBUTROL 1 MMOL/ML IV SOLN
10.0000 mL | Freq: Once | INTRAVENOUS | Status: AC | PRN
  Administered 2022-01-20: 10 mL via INTRAVENOUS

## 2022-01-21 ENCOUNTER — Other Ambulatory Visit: Payer: Self-pay | Admitting: Family Medicine

## 2022-01-24 ENCOUNTER — Ambulatory Visit (INDEPENDENT_AMBULATORY_CARE_PROVIDER_SITE_OTHER): Payer: Medicare Other

## 2022-01-24 ENCOUNTER — Ambulatory Visit: Payer: PPO | Admitting: Podiatry

## 2022-01-24 DIAGNOSIS — M2042 Other hammer toe(s) (acquired), left foot: Secondary | ICD-10-CM | POA: Diagnosis not present

## 2022-01-24 DIAGNOSIS — M2041 Other hammer toe(s) (acquired), right foot: Secondary | ICD-10-CM

## 2022-01-24 NOTE — Progress Notes (Unsigned)
[facility] b/l toes Xrays    Chief Complaint  Patient presents with   Follow-up    Patient is here for bilateral great toe follow-up.patient states that both toes are sore.    Subjective: 76 y.o. male presenting to the office today to the office for follow-up evaluation of bilateral great toe pain.  Patient has developed symptomatic calluses to the bilateral toes.  Denies a history of injury.  He does have a history of nail dystrophy to the great toes as well   Past Medical History:  Diagnosis Date   Arthritis    Depression    Diabetes mellitus with complication ([place])    Diabetic neuropathy ([place])    Diastolic dysfunction    a. TTE 07/2017: EF 60-65%, mild concentric LVH, no RWMA, Gr1DD, trivial AI, mildly dilated LA, RVSF normal, PASP normal   Edema    feet/legs   GERD (gastroesophageal reflux disease)    Gout    History of stress test    a. MV 06/2017: small in size, mild in severity, apical anterior and apical defect that was minimally reversible and most likely represented artifact and less likely ischemia/scar, LVEF 55-65%, low risk, probably normal stress test   Hypertension    Hypothyroidism    Kidney cysts    renal failure 2013   Kidney stones    Dr. Rogers Blocker   OSA (obstructive sleep apnea)    supplemental oxygen at night   Oxygen dependent    hs   [facility] spotted fever 12/10/2017   Positve IgG in titer on 11/20/17   S/P cardiac cath 1998   a. no obstructive disease   Syncope and collapse    Temporary low platelet count ([place])     Past Surgical History:  Procedure Laterality Date   ANAL FISSURE REPAIR     BACK SURGERY  1977   rupture disc lumbar spine   [facility].    CATARACT EXTRACTION W/PHACO Right 07/16/2016   Procedure: CATARACT EXTRACTION PHACO AND INTRAOCULAR LENS PLACEMENT (IOC);  Surgeon: [name], MD;  Location: [facility] ORS;  Service: Ophthalmology;  Laterality: Right;  [place]  01:11 AP% 17.6 CDE 24.83 fluid pack lot # [id number]   CATARACT EXTRACTION W/PHACO Left 08/13/2016   Procedure: CATARACT EXTRACTION PHACO AND INTRAOCULAR LENS PLACEMENT ([place]) suture placed in left eye at end of procedure;  Surgeon: [name], MD;  Location: [facility] ORS;  Service: Ophthalmology;  Laterality: Left;  [place] 01:55 AP% 22.7 CDE 53.56 fluid pack lot # [id number]   FINGER AMPUTATION     partial   KNEE SURGERY  1993   arthroscopy   SHOULDER SURGERY Bilateral 1998   arthroscopic right, rotator cuff repair left    Allergies  Allergen Reactions   Bee Venom Swelling   Dye Fdc Red [Red Dye] Swelling and Other (See Comments)    Reaction: gout   Omeprazole     Other reaction(s): increased sx on high dose   Atenolol Other (See Comments)    Did not regulate blood pressure   Benadryl [Diphenhydramine Hcl (Sleep)] Other (See Comments)    Hyperactivity    Enalapril Itching    itching   Gabapentin Palpitations and Rash    rash   Hctz [Hydrochlorothiazide] Other (See Comments)    Decreased potassium     Objective:  Physical Exam General: Alert and oriented x3 in no acute distress  Dermatology: Hyperkeratotic lesion(s) present on the bilateral great toes.  Pain on palpation with a central nucleated core noted. Skin is warm, dry and supple bilateral lower extremities. Negative for open lesions or macerations.  Vascular: Palpable pedal pulses bilaterally. No edema or erythema noted. Capillary refill within normal limits.  Neurological: Epicritic and protective threshold grossly intact bilaterally.   Musculoskeletal Exam: Pain on palpation at the keratotic lesion(s) noted. Range of motion within normal limits bilateral. Muscle strength 5/5 in all groups bilateral.  Radiographic exam B/L feet 01/24/2022: Normal osseous mineralization.  There are small plantar spurs noted to the distal phalanx bilateral great toes but this does not clinically correlate with the patient's  symptoms  Assessment: 1.  Symptomatic callus; benign skin lesion 2.  Nail dystrophy bilateral great toes   Plan of Care:  1. Patient evaluated 2. Excisional debridement of keratoic lesion(s) using a chisel blade was performed without incident.  Patient felt significant relief 3. Dressed area with light dressing. 4. Foot miracle lotion provided at checkout to apply daily 5.  Return to clinic as needed  Edrick Kins, DPM [facility]  Dr. Edrick Kins, DPM    [address], [place] [address]                Office [phone]  Fax [phone]

## 2022-01-29 DIAGNOSIS — E230 Hypopituitarism: Secondary | ICD-10-CM | POA: Diagnosis not present

## 2022-01-29 DIAGNOSIS — R972 Elevated prostate specific antigen [PSA]: Secondary | ICD-10-CM | POA: Diagnosis not present

## 2022-01-29 DIAGNOSIS — E221 Hyperprolactinemia: Secondary | ICD-10-CM | POA: Diagnosis not present

## 2022-01-30 ENCOUNTER — Telehealth: Payer: Self-pay | Admitting: Urology

## 2022-01-30 DIAGNOSIS — N2889 Other specified disorders of kidney and ureter: Secondary | ICD-10-CM

## 2022-01-30 NOTE — Telephone Encounter (Signed)
I contacted Douglas [name] to discuss his MRI report.  The left upper pole renal lesion was measured at 2.2 x 1.9 cm and was slightly smaller than previous imaging.  We discussed the stability of this lesion and he desires to continue active surveillance.  Will see back in 1 year with a renal ultrasound

## 2022-02-04 ENCOUNTER — Telehealth: Payer: Self-pay | Admitting: Pharmacist

## 2022-02-04 NOTE — Progress Notes (Signed)
[facility] [facility])  [place] Team    02/04/2022  Douglas [name] 06-27-1945 [id number]  Reason for referral: Medication Assistance  Referral source:  2024 Re-Enrollment for Douglas [name], and [name]  Current insurance: [employer]  Outreach:  Successful telephone call with Douglas [name].  HIPAA identifiers verified.   Allergies  Allergen Reactions   Bee Venom Swelling   Dye Fdc Red [Red Dye] Swelling and Other (See Comments)    Reaction: gout   Omeprazole     Other reaction(s): increased sx on high dose   Atenolol Other (See Comments)    Did not regulate blood pressure   Benadryl [Diphenhydramine Hcl (Sleep)] Other (See Comments)    Hyperactivity    Enalapril Itching    itching   Gabapentin Palpitations and Rash    rash   Hctz [Hydrochlorothiazide] Other (See Comments)    Decreased potassium    Assessment: Reviewed the 2024 re-enrollment process with Douglas [name], patient wishes to proceed with re-enrollment for [name], [name] and Douglas [name].  Confirmed with patient no changes in household size or income, confirmed mailing address, instructed patient we will need an updated copy of household income documents for 2023. HTA card for 2023 is in media section of EMR. Patient verbalized understanding and was appreciative of my call.   Extra Help:  Not eligible for Extra Help Low Income Subsidy based on reported income and assets  Patient Assistance Programs: Douglas [name] made by Douglas [name] requirement met: Yes Out-of-pocket prescription expenditure met:   Not Applicable Patient has met application requirements to apply for this program.    Douglas [name] made by [employer] requirement met: Yes Out-of-pocket prescription expenditure met:   Not Applicable Patient has met application requirements to apply for this program.    Douglas [name] made by [employer] requirement met: Yes Out-of-pocket prescription  expenditure met:   Not Applicable Patient has met application requirements to apply for this program.    Plan: I will route patient assistance letter to Douglas [name] technician who will coordinate patient assistance program application process for medications listed above.  [facility] pharmacy technician will assist with obtaining all required documents from both patient and provider(s) and submit application(s) once completed.   Douglas [name], PharmD Douglas [name] Pharmacist Office: [phone]

## 2022-02-12 ENCOUNTER — Telehealth: Payer: Self-pay | Admitting: Pharmacy Technician

## 2022-02-12 DIAGNOSIS — Z596 Low income: Secondary | ICD-10-CM

## 2022-02-12 NOTE — Progress Notes (Signed)
[facility] [place] [facility])                                            [place] Team    02/12/2022  Douglas [name] [birth year] [id number]                                      Medication Assistance Referral-FOR 2024 RE ENROLLMENT  Referral From: [place]  Medication/Company: Larna Daughters / [employer] Patient application portion:  Education officer, museum portion: Faxed  to Dr. [name]  Provider address/fax verified via: Office website  Medication/Company: Tyler Aas / [employer] Patient application portion:  Education officer, museum portion: Faxed  to Dr. [name] Provider address/fax verified via: Office website  Medication/Company: Wilder Glade / [name] Patient application portion:  Mailed Provider application portion: Faxed  to Dr. [name] Provider address/fax verified via: Office website  Douglas [name] P. Douglas [name], [place]  [phone]

## 2022-02-19 ENCOUNTER — Other Ambulatory Visit: Payer: Self-pay

## 2022-02-19 ENCOUNTER — Ambulatory Visit: Payer: Medicare Other | Admitting: Family Medicine

## 2022-02-19 DIAGNOSIS — E1142 Type 2 diabetes mellitus with diabetic polyneuropathy: Secondary | ICD-10-CM

## 2022-02-19 MED ORDER — TRESIBA FLEXTOUCH 200 UNIT/ML ~~LOC~~ SOPN: 39.0000 [IU] | PEN_INJECTOR | Freq: Every day | SUBCUTANEOUS | 3 refills | Status: DC

## 2022-02-19 MED ORDER — DAPAGLIFLOZIN PROPANEDIOL 10 MG PO TABS: 10.0000 mg | ORAL_TABLET | Freq: Every day | ORAL | 3 refills | Status: DC

## 2022-02-19 MED ORDER — NOVOFINE PEN NEEDLE 32G X 6 MM MISC: 1.0000 | Freq: Two times a day (BID) | 3 refills | Status: AC

## 2022-03-05 ENCOUNTER — Ambulatory Visit (INDEPENDENT_AMBULATORY_CARE_PROVIDER_SITE_OTHER): Payer: PPO | Admitting: Family Medicine

## 2022-03-05 ENCOUNTER — Telehealth: Payer: Self-pay | Admitting: Family Medicine

## 2022-03-05 ENCOUNTER — Encounter: Payer: Self-pay | Admitting: Family Medicine

## 2022-03-05 VITALS — BP 118/78 | HR 76 | Temp 98.5°F | Ht 72.0 in | Wt 262.8 lb

## 2022-03-05 DIAGNOSIS — M1A3790 Chronic gout due to renal impairment, unspecified ankle and foot, without tophus (tophi): Secondary | ICD-10-CM | POA: Diagnosis not present

## 2022-03-05 DIAGNOSIS — G4733 Obstructive sleep apnea (adult) (pediatric): Secondary | ICD-10-CM | POA: Diagnosis not present

## 2022-03-05 DIAGNOSIS — Z23 Encounter for immunization: Secondary | ICD-10-CM | POA: Diagnosis not present

## 2022-03-05 DIAGNOSIS — E1142 Type 2 diabetes mellitus with diabetic polyneuropathy: Secondary | ICD-10-CM | POA: Diagnosis not present

## 2022-03-05 DIAGNOSIS — E785 Hyperlipidemia, unspecified: Secondary | ICD-10-CM

## 2022-03-05 DIAGNOSIS — I10 Essential (primary) hypertension: Secondary | ICD-10-CM | POA: Diagnosis not present

## 2022-03-05 DIAGNOSIS — N529 Male erectile dysfunction, unspecified: Secondary | ICD-10-CM | POA: Diagnosis not present

## 2022-03-05 LAB — LIPID PANEL
Cholesterol: 175 mg/dL (ref 0–200)
HDL: 40.2 mg/dL (ref >39.00)
NonHDL: 134.54
Total CHOL/HDL Ratio: 4
Triglycerides: 261 mg/dL — ABNORMAL HIGH (ref 0.0–149.0)
VLDL: 52.2 mg/dL — ABNORMAL HIGH (ref 0.0–40.0)

## 2022-03-05 LAB — COMPREHENSIVE METABOLIC PANEL
ALT: 12 U/L (ref 0–53)
AST: 13 U/L (ref 0–37)
Albumin: 4.1 g/dL (ref 3.5–5.2)
Alkaline Phosphatase: 65 U/L (ref 39–117)
BUN: 17 mg/dL (ref 6–23)
CO2: 28 mEq/L (ref 19–32)
Calcium: 8.5 mg/dL (ref 8.4–10.5)
Chloride: 101 mEq/L (ref 96–112)
Creatinine, Ser: 1.52 mg/dL — ABNORMAL HIGH (ref 0.40–1.50)
GFR: 44.15 mL/min — ABNORMAL LOW (ref >60.00)
Glucose, Bld: 94 mg/dL (ref 70–99)
Potassium: 4.2 mEq/L (ref 3.5–5.1)
Sodium: 138 mEq/L (ref 135–145)
Total Bilirubin: 0.7 mg/dL (ref 0.2–1.2)
Total Protein: 6.5 g/dL (ref 6.0–8.3)

## 2022-03-05 LAB — CBC WITH DIFFERENTIAL/PLATELET
Basophils Absolute: 0.1 10*3/uL (ref 0.0–0.1)
Basophils Relative: 1.4 % (ref 0.0–3.0)
Eosinophils Absolute: 0.5 10*3/uL (ref 0.0–0.7)
Eosinophils Relative: 7.3 % — ABNORMAL HIGH (ref 0.0–5.0)
HCT: 38.3 % — ABNORMAL LOW (ref 39.0–52.0)
Hemoglobin: 12.1 g/dL — ABNORMAL LOW (ref 13.0–17.0)
Lymphocytes Relative: 29.1 % (ref 12.0–46.0)
Lymphs Abs: 2 10*3/uL (ref 0.7–4.0)
MCHC: 31.6 g/dL (ref 30.0–36.0)
MCV: 79.8 fl (ref 78.0–100.0)
Monocytes Absolute: 0.6 10*3/uL (ref 0.1–1.0)
Monocytes Relative: 8 % (ref 3.0–12.0)
Neutro Abs: 3.8 10*3/uL (ref 1.4–7.7)
Neutrophils Relative %: 54.2 % (ref 43.0–77.0)
Platelets: 224 10*3/uL (ref 150.0–400.0)
RBC: 4.79 Mil/uL (ref 4.22–5.81)
RDW: 17.4 % — ABNORMAL HIGH (ref 11.5–15.5)
WBC: 7 10*3/uL (ref 4.0–10.5)

## 2022-03-05 LAB — MICROALBUMIN / CREATININE URINE RATIO
Creatinine,U: 109.7 mg/dL
Microalb Creat Ratio: 0.6 mg/g (ref 0.0–30.0)
Microalb, Ur: <0.7 mg/dL (ref 0.0–1.9)

## 2022-03-05 LAB — HEMOGLOBIN A1C: Hgb A1c MFr Bld: 6.5 % (ref 4.6–6.5)

## 2022-03-05 LAB — URIC ACID: Uric Acid, Serum: 6.7 mg/dL (ref 4.0–7.8)

## 2022-03-05 LAB — LDL CHOLESTEROL, DIRECT: Direct LDL: 106 mg/dL

## 2022-03-05 MED ORDER — MOMETASONE FUROATE 50 MCG/ACT NA SUSP: 2.0000 | Freq: Every day | NASAL | 12 refills | Status: DC

## 2022-03-05 NOTE — Telephone Encounter (Signed)
Pt called stating the flonase is to expensive and would like something cheaper

## 2022-03-05 NOTE — Assessment & Plan Note (Signed)
Adequately controlled for his age particularly in the setting of low blood pressures with higher doses of medication previously.  He will continue carvedilol 6.25 mg twice daily, Cardura 4 mg daily, Lasix 40 mg daily, and losartan 25 mg in the morning and 50 mg at night.

## 2022-03-05 NOTE — Assessment & Plan Note (Signed)
No recent flares.  Will check a uric acid.  Discussed we could try coming off of the allopurinol if he wants.  If he has a recurrent gout flare he will let us know and we can treat the flare and restart the allopurinol.

## 2022-03-05 NOTE — Assessment & Plan Note (Signed)
Patient declines use of CPAP.  Discussed possible inspire procedure versus oral device.  Patient declines having any surgery for this issue.  I advised that he discuss an oral device with his dentist.

## 2022-03-05 NOTE — Assessment & Plan Note (Signed)
I encouraged the patient to follow-up with urology to see what the other treatment options were.

## 2022-03-05 NOTE — Patient Instructions (Signed)
Nice to see you. We are stopping your allopurinol.  If you have recurrence of your gout please let us know. Will contact you with your lab results.

## 2022-03-05 NOTE — Assessment & Plan Note (Signed)
Check A1c and urine microalbumin.  Patient will continue Ozempic 1 mg weekly, metformin XR 1000 mg twice daily, [name] 40 units daily, and [name] 10 mg daily.  He was encouraged to see the ophthalmologist for a yearly checkup.

## 2022-03-05 NOTE — Progress Notes (Signed)
[name], MD Phone: [phone]  Douglas [name] is a 76 y.o. male who presents today for follow-up.  HYPERTENSION Disease Monitoring Home BP Monitoring 130s/80s Chest pain- no    Dyspnea- no Medications Compliance-  taking coreg, cardura, lasix, losartan. Lightheadedness-  no  Edema- notes resolves over night, is chronic and unchanged Patient has in the past we have tried to increase his blood pressure medications and he got significantly lightheaded. BMET    Component Value Date/Time   NA 137 12/19/2020 1156   NA 140 11/12/2018 1142   NA 142 12/04/2012 0414   K 4.3 12/19/2020 1156   K 4.4 12/04/2012 0414   CL 100 12/19/2020 1156   CL 110 (H) 12/04/2012 0414   CO2 29 12/19/2020 1156   CO2 26 12/04/2012 0414   GLUCOSE 92 12/19/2020 1156   GLUCOSE 116 (H) 12/04/2012 0414   BUN 11 12/19/2020 1156   BUN 13 11/12/2018 1142   BUN 32 (H) 12/04/2012 0414   CREATININE 1.31 12/19/2020 1156   CREATININE 1.30 (H) 11/17/2016 1012   CALCIUM 8.9 12/19/2020 1156   CALCIUM 9.1 12/04/2012 0414   GFRNONAA 46 (L) 01/19/2019 1920   GFRNONAA 28 (L) 12/04/2012 0414   GFRAA 53 (L) 01/19/2019 1920   GFRAA 32 (L) 12/04/2012 0414   Neuropathy: Patient notes Lyrica is quite beneficial.  He is occasionally sleepy with this.  He does not frequently take the Norco.   OSA: Patient does not use a CPAP.  He sleeps 12 hours at night and takes 1 to 2-hour nap during the day.  Diabetes: Does not check sugars.  He is taking [place], [name], metformin, and Ozempic.  He is due to see ophthalmology.  Gout: Patient has not had a recent gout flare.  He wonders about stopping the allopurinol.  Hyperlipidemia: He is on simvastatin.  He asks if he can stop this medication.  Erectile dysfunction: Patient notes he cannot maintain an erection or ejaculate.  He is on Cialis which helps a little bit.  He recently bought a pump that has been somewhat beneficial.  He does follow with urology.  Social  History   Tobacco Use  Smoking Status Never  Smokeless Tobacco Never    Current Outpatient Medications on File Prior to Visit  Medication Sig Dispense Refill   acetaminophen (TYLENOL) 500 MG tablet Take 500 mg by mouth every 6 (six) hours as needed.     aspirin 81 MG tablet Take 1 tablet (81 mg total) by mouth daily.     B Complex Vitamins (BL VITAMIN B COMPLEX PO) Take by mouth daily.     calcitRIOL (ROCALTROL) 0.25 MCG capsule Take 0.25 mcg by mouth daily.   5   carvedilol (COREG) 6.25 MG tablet TAKE 1 TABLET(6.25 MG) BY MOUTH TWICE DAILY WITH A MEAL 180 tablet 0   Cholecalciferol (VITAMIN D3 PO) Take by mouth daily.     clotrimazole-betamethasone (LOTRISONE) cream Apply 1 application. topically 2 (two) times daily. 45 g 1   clove oil liquid Apply 1 application topically as needed.     cyclobenzaprine (FLEXERIL) 10 MG tablet Take 1 tablet by mouth as needed.     dapagliflozin propanediol ([name]) 10 MG TABS tablet Take 1 tablet (10 mg total) by mouth daily. 90 tablet 3   doxazosin (CARDURA) 4 MG tablet TAKE 1 TABLET(4 MG) BY MOUTH DAILY 90 tablet 1   DULoxetine (CYMBALTA) 60 MG capsule TAKE 1 CAPSULE(60 MG) BY MOUTH DAILY 90 capsule 1  esomeprazole (NEXIUM) 40 MG capsule Take 1 capsule (40 mg total) by mouth daily at 12 noon. 90 capsule 3   finasteride (PROSCAR) 5 MG tablet TAKE 1 TABLET(5 MG) BY MOUTH AT BEDTIME 90 tablet 3   furosemide (LASIX) 40 MG tablet TAKE 1 TABLET(40 MG) BY MOUTH DAILY 90 tablet 1   gentamicin cream (GARAMYCIN) 0.1 % Apply 1 application. topically 2 (two) times daily. 30 g 1   HYDROcodone-acetaminophen (NORCO/VICODIN) 5-325 MG tablet Take 1 tablet by mouth every 6 (six) hours as needed for moderate pain. 30 tablet 0   insulin degludec ([name] FLEXTOUCH) 200 UNIT/ML FlexTouch Pen Inject 40 Units into the skin daily. 15 mL 3   Insulin Pen Needle (NOVOFINE PEN NEEDLE) 32G X 6 MM MISC 1 each by Does not apply route 2 (two) times daily. 1 each 3   levothyroxine  (SYNTHROID) 100 MCG tablet TAKE 1 TABLET(100 MCG) BY MOUTH DAILY BEFORE BREAKFAST 90 tablet 1   loratadine (CLARITIN) 10 MG tablet Take 10 mg by mouth daily as needed for allergies.     losartan (COZAAR) 25 MG tablet TAKE 1 TABLET BY MOUTH EVERY MORNING THEN TAKE 2 TABLETS BY MOUTH EVERY EVENING 270 tablet 0   MAGNESIUM PO Take by mouth daily at 8 pm.     meloxicam (MOBIC) 15 MG tablet Take 1 tablet (15 mg total) by mouth daily. 30 tablet 1   metFORMIN (GLUCOPHAGE-XR) 500 MG 24 hr tablet TAKE 2 TABLETS(1000 MG) BY MOUTH TWICE DAILY 360 tablet 3   Multiple Vitamins-Minerals (MULTIVITAMIN MEN PO) Take 1 tablet by mouth daily.     Needle, Disp, (HYPODERMIC NEEDLE 18GX1") 18G X 1" MISC Use every 14 days to draw up testosterone     Needle, Disp, (HYPODERMIC NEEDLE 25GX1") 25G X 1" MISC Use every 14 days to administer testosterone     pregabalin (LYRICA) 200 MG capsule TAKE 1 CAPSULE(200 MG) BY MOUTH TWICE DAILY 180 capsule 0   Semaglutide, 1 MG/DOSE, (OZEMPIC, 1 MG/DOSE,) 4 MG/3ML SOPN Inject 1 mg into the skin once a week. 3 mL 3   simvastatin (ZOCOR) 40 MG tablet TAKE 1 TABLET(40 MG) BY MOUTH AT BEDTIME 90 tablet 3   sulfamethoxazole-trimethoprim (BACTRIM DS) 800-160 MG tablet Take 1 tablet by mouth 2 (two) times daily. 14 tablet 0   tadalafil (CIALIS) 20 MG tablet Take by mouth.     testosterone cypionate (DEPOTESTOSTERONE CYPIONATE) 200 MG/ML injection Inject 200 mg into the muscle every 14 (fourteen) days.     No current facility-administered medications on file prior to visit.     ROS see history of present illness  Objective  Physical Exam Vitals:   03/05/22 1057 03/05/22 1114  BP: 124/82 118/78  Pulse: 76   Temp: 98.5 F (36.9 C)   SpO2: 98%     BP Readings from Last 3 Encounters:  03/05/22 118/78  11/18/21 122/80  09/11/21 138/70   Wt Readings from Last 3 Encounters:  03/05/22 262 lb 12.8 oz (119.2 kg)  11/18/21 253 lb (114.8 kg)  09/11/21 256 lb 9.6 oz (116.4 kg)     Physical Exam Constitutional:      General: He is not in acute distress.    Appearance: He is not diaphoretic.  Cardiovascular:     Rate and Rhythm: Normal rate and regular rhythm.     Heart sounds: Normal heart sounds.  Pulmonary:     Effort: Pulmonary effort is normal.     Breath sounds: Normal breath sounds.  Skin:  General: Skin is warm and dry.  Neurological:     Mental Status: He is alert.      Assessment/Plan: Please see individual problem list.  Problem List Items Addressed This Visit     Diabetic neuropathy ([place]) (Chronic)    Stable.  Patient will continue Lyrica 200 mg twice daily.  He can continue as needed use of Vicodin.      DM type 2 with diabetic peripheral neuropathy (HCC) (Chronic)    Check A1c and urine microalbumin.  Patient will continue Ozempic 1 mg weekly, metformin XR 1000 mg twice daily, [name] 40 units daily, and [name] 10 mg daily.  He was encouraged to see the ophthalmologist for a yearly checkup.      Relevant Orders   HgB A1c   Urine Microalbumin w/creat. ratio   Essential hypertension - Primary (Chronic)    Adequately controlled for his age particularly in the setting of low blood pressures with higher doses of medication previously.  He will continue carvedilol 6.25 mg twice daily, Cardura 4 mg daily, Lasix 40 mg daily, and losartan 25 mg in the morning and 50 mg at night.      Relevant Orders   Comp Met (CMET)   Gout (Chronic)    No recent flares.  Will check a uric acid.  Discussed we could try coming off of the allopurinol if he wants.  If he has a recurrent gout flare he will let us know and we can treat the flare and restart the allopurinol.      Relevant Orders   Uric acid   CBC w/Diff   Hyperlipidemia (Chronic)    Discussed that the patient needed to stay on the simvastatin given his diabetes history.  He will continue simvastatin 40 mg daily.  Will check labs today.      Relevant Orders   Lipid panel   OSA  (obstructive sleep apnea) (Chronic)    Patient declines use of CPAP.  Discussed possible inspire procedure versus oral device.  Patient declines having any surgery for this issue.  I advised that he discuss an oral device with his dentist.      Erectile dysfunction    I encouraged the patient to follow-up with urology to see what the other treatment options were.       Return in about 3 months (around 06/05/2022).   [name], MD [place]

## 2022-03-05 NOTE — Assessment & Plan Note (Signed)
Stable.  Patient will continue Lyrica 200 mg twice daily.  He can continue as needed use of Vicodin.

## 2022-03-05 NOTE — Assessment & Plan Note (Signed)
Discussed that the patient needed to stay on the simvastatin given his diabetes history.  He will continue simvastatin 40 mg daily.  Will check labs today.

## 2022-03-09 ENCOUNTER — Other Ambulatory Visit: Payer: Self-pay | Admitting: Family Medicine

## 2022-03-09 DIAGNOSIS — D649 Anemia, unspecified: Secondary | ICD-10-CM

## 2022-03-10 DIAGNOSIS — L570 Actinic keratosis: Secondary | ICD-10-CM | POA: Diagnosis not present

## 2022-03-10 DIAGNOSIS — L821 Other seborrheic keratosis: Secondary | ICD-10-CM | POA: Diagnosis not present

## 2022-03-10 DIAGNOSIS — D2261 Melanocytic nevi of right upper limb, including shoulder: Secondary | ICD-10-CM | POA: Diagnosis not present

## 2022-03-10 DIAGNOSIS — D225 Melanocytic nevi of trunk: Secondary | ICD-10-CM | POA: Diagnosis not present

## 2022-03-10 DIAGNOSIS — D2262 Melanocytic nevi of left upper limb, including shoulder: Secondary | ICD-10-CM | POA: Diagnosis not present

## 2022-03-10 DIAGNOSIS — X32XXXA Exposure to sunlight, initial encounter: Secondary | ICD-10-CM | POA: Diagnosis not present

## 2022-03-10 DIAGNOSIS — D2272 Melanocytic nevi of left lower limb, including hip: Secondary | ICD-10-CM | POA: Diagnosis not present

## 2022-03-10 DIAGNOSIS — D2271 Melanocytic nevi of right lower limb, including hip: Secondary | ICD-10-CM | POA: Diagnosis not present

## 2022-03-11 ENCOUNTER — Telehealth: Payer: Self-pay

## 2022-03-11 NOTE — Telephone Encounter (Signed)
I do not see flonase on his medication list. Please confirm that is the medication that he meant. Thanks.

## 2022-03-11 NOTE — Telephone Encounter (Signed)
Attempted to call Patient-voicemail box has not been set up

## 2022-03-11 NOTE — Telephone Encounter (Signed)
-----   Message from [name], MD sent at 03/09/2022  2:52 PM EST ----- Please let the patient know that he is mildly anemic. I would like to get some additional labs to determine what might be causing this. Orders placed. His uric acid was above a goal of 6. If he has a gout flare off of the allopurinol he needs to let me know. His A1c is well controlled. His kidney function is stable. His LDL is not at goal. Had he been taking his simvastatin?

## 2022-03-13 ENCOUNTER — Other Ambulatory Visit: Payer: Self-pay

## 2022-03-13 MED ORDER — MOMETASONE FUROATE 50 MCG/ACT NA SUSP: 2.0000 | Freq: Every day | NASAL | 12 refills | Status: AC

## 2022-03-13 NOTE — Telephone Encounter (Signed)
Patient need Nasonex and the refill has been sent to Pharmacy

## 2022-03-17 ENCOUNTER — Other Ambulatory Visit: Payer: Self-pay

## 2022-03-17 ENCOUNTER — Telehealth: Payer: Self-pay

## 2022-03-17 ENCOUNTER — Other Ambulatory Visit (INDEPENDENT_AMBULATORY_CARE_PROVIDER_SITE_OTHER): Payer: PPO

## 2022-03-17 ENCOUNTER — Other Ambulatory Visit: Payer: Self-pay | Admitting: Family Medicine

## 2022-03-17 DIAGNOSIS — D649 Anemia, unspecified: Secondary | ICD-10-CM | POA: Diagnosis not present

## 2022-03-17 DIAGNOSIS — I10 Essential (primary) hypertension: Secondary | ICD-10-CM

## 2022-03-17 LAB — IBC + FERRITIN
Ferritin: 9.2 ng/mL — ABNORMAL LOW (ref 22.0–322.0)
Iron: 44 ug/dL (ref 42–165)
Saturation Ratios: 10.3 % — ABNORMAL LOW (ref 20.0–50.0)
TIBC: 427 ug/dL (ref 250.0–450.0)
Transferrin: 305 mg/dL (ref 212.0–360.0)

## 2022-03-17 LAB — FOLATE: Folate: >23.8 ng/mL (ref >5.9)

## 2022-03-17 LAB — VITAMIN B12: Vitamin B-12: 166 pg/mL — ABNORMAL LOW (ref 211–911)

## 2022-03-17 MED ORDER — CALCITRIOL 0.25 MCG PO CAPS: 0.2500 ug | ORAL_CAPSULE | Freq: Every day | ORAL | 5 refills | Status: DC

## 2022-03-17 MED ORDER — DOXAZOSIN MESYLATE 4 MG PO TABS: ORAL_TABLET | ORAL | 1 refills | Status: DC

## 2022-03-17 NOTE — Telephone Encounter (Signed)
Patient states he needs a refill for his  doxazosin (CARDURA) 4 MG tablet and his calcitRIOL (ROCALTROL) 0.25 MCG capsule.  Patient states he has been out of these medications at least a week and he is having side effects from not having the doxazosin (CARDURA) 4 MG tablet (really weak urination).  Patient states his nasal spray (flonase) is still $200 and he has been using over the counter medication.  *Pharmacy is [facility] on [address] and [address].

## 2022-03-17 NOTE — Telephone Encounter (Signed)
Prescriptions sent in

## 2022-03-18 ENCOUNTER — Telehealth: Payer: Self-pay

## 2022-03-18 ENCOUNTER — Other Ambulatory Visit: Payer: Self-pay | Admitting: Family Medicine

## 2022-03-18 DIAGNOSIS — E538 Deficiency of other specified B group vitamins: Secondary | ICD-10-CM

## 2022-03-18 NOTE — Telephone Encounter (Signed)
Patient states he is returning our call from today.  I was unable to reach Jeralyn Bennett, CMA, at the time of the call so I let patient know I will send her a message.

## 2022-03-20 ENCOUNTER — Other Ambulatory Visit (INDEPENDENT_AMBULATORY_CARE_PROVIDER_SITE_OTHER): Payer: PPO

## 2022-03-20 ENCOUNTER — Ambulatory Visit (INDEPENDENT_AMBULATORY_CARE_PROVIDER_SITE_OTHER): Payer: PPO

## 2022-03-20 ENCOUNTER — Telehealth: Payer: Self-pay

## 2022-03-20 DIAGNOSIS — E538 Deficiency of other specified B group vitamins: Secondary | ICD-10-CM

## 2022-03-20 MED ORDER — CYANOCOBALAMIN 1000 MCG/ML IJ SOLN
1000.0000 ug | Freq: Once | INTRAMUSCULAR | Status: AC
  Administered 2022-03-20: 1000 ug via INTRAMUSCULAR

## 2022-03-20 NOTE — Telephone Encounter (Signed)
Discussed message with patient at lab visit today. He is scheduled to see Dr. [name] on 12/27. He is aware the he needs to contact us if he gets worse. He is also aware if his abdominal pain becomes really bad that he needs to go to the ER & the patient agreed.  Pt states that he does not have any visible blood in his urine or dark stools. Patient states that he only has gas & believes that is the cause of his abdominal pain.  Note: Upon discussing this with patient I observed that his skin appears to look jaundice

## 2022-03-20 NOTE — Telephone Encounter (Signed)
Noted. His liver enzymes have been normal. His skin color had not changed when I last saw him.

## 2022-03-20 NOTE — Telephone Encounter (Signed)
Called prt to discuss information given by Dr. Biagio Quint and to possibly get him scheduled for an OV;   His wiffe answered and stated she will have him call the office back. :    [name], MD 03/19/2022 11:59 AM EST Back to Top    The only way to know if he is losing blood from his GI tract is to have the GI work up. He can be scheduled to see me in the office to discuss this further if needed.

## 2022-03-20 NOTE — Progress Notes (Signed)
Patient presented for B 12 injection to right deltoid, patient voiced no concerns nor showed any signs of distress during injection. 

## 2022-03-25 ENCOUNTER — Telehealth: Payer: Self-pay | Admitting: Pharmacy Technician

## 2022-03-25 DIAGNOSIS — Z596 Low income: Secondary | ICD-10-CM

## 2022-03-25 LAB — INTRINSIC FACTOR ANTIBODIES: Intrinsic Factor: NEGATIVE

## 2022-03-25 NOTE — Progress Notes (Signed)
[facility])                                            [place] Team    03/25/2022  Milus Fritze Albo 1946-03-09 [id number]  Incoming mail received from patient in regard to [name] application for [place]  The letter informs that the patient is APPROVED 04/14/22-04/14/23. Faxed into [name] an update prescription for use in 2024. Medication will be automatically be refilled and  delivered to the patient's home address onfile with [name] as refills come due.  Neosha Switalski P. [place], [place]  [phone]

## 2022-03-25 NOTE — Progress Notes (Signed)
[facility])                                            [place] Team    03/25/2022  Jabez Molner Storlie [birth year] [id number]  Received both patient and provider portion(s) of patient assistance application(s) for [place] and [name] & Ozempic. Faxed completed application and required documents into [name] for [place] and into [employer] for [employer].Sharee Pimple P. [place], [facility]  [phone]

## 2022-03-27 ENCOUNTER — Ambulatory Visit (INDEPENDENT_AMBULATORY_CARE_PROVIDER_SITE_OTHER): Payer: PPO

## 2022-03-27 DIAGNOSIS — E538 Deficiency of other specified B group vitamins: Secondary | ICD-10-CM

## 2022-03-27 MED ORDER — CYANOCOBALAMIN 1000 MCG/ML IJ SOLN
1000.0000 ug | Freq: Once | INTRAMUSCULAR | Status: AC
  Administered 2022-03-27: 1000 ug via INTRAMUSCULAR

## 2022-03-27 NOTE — Progress Notes (Signed)
Pt presented for their vitamin B12 injection. Pt was identified through two identifiers. Pt tolerated shot well in their left  deltoid.  

## 2022-04-03 ENCOUNTER — Ambulatory Visit: Payer: PPO

## 2022-04-03 DIAGNOSIS — E538 Deficiency of other specified B group vitamins: Secondary | ICD-10-CM

## 2022-04-03 MED ORDER — CYANOCOBALAMIN 1000 MCG/ML IJ SOLN
1000.0000 ug | Freq: Once | INTRAMUSCULAR | Status: AC
  Administered 2022-04-03: 1000 ug via INTRAMUSCULAR

## 2022-04-03 NOTE — Progress Notes (Signed)
Pt presented for their vitamin B12 injection. Pt was identified through two identifiers. Pt tolerated shot well in their right deltoid.  

## 2022-04-09 ENCOUNTER — Telehealth: Payer: PPO | Admitting: Family Medicine

## 2022-04-16 ENCOUNTER — Other Ambulatory Visit: Payer: Self-pay | Admitting: Family

## 2022-04-16 ENCOUNTER — Other Ambulatory Visit: Payer: Self-pay | Admitting: Family Medicine

## 2022-04-17 ENCOUNTER — Ambulatory Visit (INDEPENDENT_AMBULATORY_CARE_PROVIDER_SITE_OTHER): Payer: PPO | Admitting: Family Medicine

## 2022-04-17 ENCOUNTER — Encounter: Payer: Self-pay | Admitting: Family Medicine

## 2022-04-17 VITALS — BP 110/70 | HR 83 | Temp 98.6°F | Ht 72.0 in | Wt 263.0 lb

## 2022-04-17 DIAGNOSIS — E538 Deficiency of other specified B group vitamins: Secondary | ICD-10-CM

## 2022-04-17 DIAGNOSIS — E611 Iron deficiency: Secondary | ICD-10-CM | POA: Diagnosis not present

## 2022-04-17 DIAGNOSIS — M79676 Pain in unspecified toe(s): Secondary | ICD-10-CM

## 2022-04-17 MED ORDER — PREGABALIN 200 MG PO CAPS: ORAL_CAPSULE | ORAL | 1 refills | Status: DC

## 2022-04-17 MED ORDER — CYANOCOBALAMIN 1000 MCG/ML IJ SOLN
1000.0000 ug | Freq: Once | INTRAMUSCULAR | Status: AC
  Administered 2022-04-17: 1000 ug via INTRAMUSCULAR

## 2022-04-17 NOTE — Progress Notes (Signed)
[name], MD Phone: [phone]  Douglas [name] is a 77 y.o. male who presents today for follow-up.  Iron deficiency: Patient notes no abdominal pain, nausea, vomiting, or constipation.  Occasionally has diarrhea from metformin.  No blood in his stool or melena.  He does take Nexium for reflux.  He reports an EGD quite sometime ago.  His last colonoscopy was in 2017.  Great toe pain: This is a chronic issue.  He has been following with podiatry.  They took his nails off of his great toes.  He has constant pain in his bilateral great toes from the edge of the nailbed back into the toe and up into the foot.  Feels like a file is stuck into his foot and toe.  He reports podiatry does not know if they have anything else to provide to him.  Social History   Tobacco Use  Smoking Status Never  Smokeless Tobacco Never    Current Outpatient Medications on File Prior to Visit  Medication Sig Dispense Refill   acetaminophen (TYLENOL) 500 MG tablet Take 500 mg by mouth every 6 (six) hours as needed.     aspirin 81 MG tablet Take 1 tablet (81 mg total) by mouth daily.     B Complex Vitamins (BL VITAMIN B COMPLEX PO) Take by mouth daily.     calcitRIOL (ROCALTROL) 0.25 MCG capsule Take 1 capsule (0.25 mcg total) by mouth daily. 30 capsule 5   carvedilol (COREG) 6.25 MG tablet TAKE 1 TABLET(6.25 MG) BY MOUTH TWICE DAILY WITH A MEAL 180 tablet 0   Cholecalciferol (VITAMIN D3 PO) Take by mouth daily.     clotrimazole-betamethasone (LOTRISONE) cream Apply 1 application. topically 2 (two) times daily. 45 g 1   clove oil liquid Apply 1 application topically as needed.     cyclobenzaprine (FLEXERIL) 10 MG tablet Take 1 tablet by mouth as needed.     dapagliflozin propanediol ([name]) 10 MG TABS tablet Take 1 tablet (10 mg total) by mouth daily. 90 tablet 3   doxazosin (CARDURA) 4 MG tablet TAKE 1 TABLET(4 MG) BY MOUTH DAILY 90 tablet 1   DULoxetine (CYMBALTA) 60 MG capsule TAKE 1 CAPSULE(60  MG) BY MOUTH DAILY 90 capsule 1   esomeprazole (NEXIUM) 40 MG capsule Take 1 capsule (40 mg total) by mouth daily at 12 noon. 90 capsule 3   finasteride (PROSCAR) 5 MG tablet TAKE 1 TABLET(5 MG) BY MOUTH AT BEDTIME 90 tablet 3   furosemide (LASIX) 40 MG tablet TAKE 1 TABLET(40 MG) BY MOUTH DAILY 90 tablet 1   gentamicin cream (GARAMYCIN) 0.1 % Apply 1 application. topically 2 (two) times daily. 30 g 1   HYDROcodone-acetaminophen (NORCO/VICODIN) 5-325 MG tablet Take 1 tablet by mouth every 6 (six) hours as needed for moderate pain. 30 tablet 0   insulin degludec ([name] FLEXTOUCH) 200 UNIT/ML FlexTouch Pen Inject 40 Units into the skin daily. 15 mL 3   Insulin Pen Needle (NOVOFINE PEN NEEDLE) 32G X 6 MM MISC 1 each by Does not apply route 2 (two) times daily. 1 each 3   levothyroxine (SYNTHROID) 100 MCG tablet TAKE 1 TABLET(100 MCG) BY MOUTH DAILY BEFORE BREAKFAST 90 tablet 1   loratadine (CLARITIN) 10 MG tablet Take 10 mg by mouth daily as needed for allergies.     losartan (COZAAR) 25 MG tablet TAKE 1 TABLET BY MOUTH EVERY MORNING THEN TAKE 2 TABLETS BY MOUTH EVERY EVENING 270 tablet 0   MAGNESIUM PO Take by mouth daily  at 8 pm.     meloxicam (MOBIC) 15 MG tablet Take 1 tablet (15 mg total) by mouth daily. 30 tablet 1   metFORMIN (GLUCOPHAGE-XR) 500 MG 24 hr tablet TAKE 2 TABLETS(1000 MG) BY MOUTH TWICE DAILY 360 tablet 3   mometasone (NASONEX) 50 MCG/ACT nasal spray Place 2 sprays into the nose daily. 1 each 12   Multiple Vitamins-Minerals (MULTIVITAMIN MEN PO) Take 1 tablet by mouth daily.     Needle, Disp, (HYPODERMIC NEEDLE 18GX1") 18G X 1" MISC Use every 14 days to draw up testosterone     Needle, Disp, (HYPODERMIC NEEDLE 25GX1") 25G X 1" MISC Use every 14 days to administer testosterone     Semaglutide, 1 MG/DOSE, (OZEMPIC, 1 MG/DOSE,) 4 MG/3ML SOPN Inject 1 mg into the skin once a week. 3 mL 3   simvastatin (ZOCOR) 40 MG tablet TAKE 1 TABLET(40 MG) BY MOUTH AT BEDTIME 90 tablet 3    sulfamethoxazole-trimethoprim (BACTRIM DS) 800-160 MG tablet Take 1 tablet by mouth 2 (two) times daily. 14 tablet 0   tadalafil (CIALIS) 20 MG tablet Take by mouth.     testosterone cypionate (DEPOTESTOSTERONE CYPIONATE) 200 MG/ML injection Inject 200 mg into the muscle every 14 (fourteen) days.     No current facility-administered medications on file prior to visit.     ROS see history of present illness  Objective  Physical Exam Vitals:   04/17/22 1607  BP: 110/70  Pulse: 83  Temp: 98.6 F (37 C)  SpO2: 96%    BP Readings from Last 3 Encounters:  04/17/22 110/70  03/05/22 118/78  11/18/21 122/80   Wt Readings from Last 3 Encounters:  04/17/22 263 lb (119.3 kg)  03/05/22 262 lb 12.8 oz (119.2 kg)  11/18/21 253 lb (114.8 kg)    Physical Exam Constitutional:      General: He is not in acute distress.    Appearance: He is not diaphoretic.  Pulmonary:     Effort: Pulmonary effort is normal.  Abdominal:     General: Bowel sounds are normal. There is no distension.     Palpations: Abdomen is soft.     Tenderness: There is no abdominal tenderness.  Skin:    General: Skin is warm and dry.  Neurological:     Mental Status: He is alert.      Assessment/Plan: Please see individual problem list.  Pain of great toe, unspecified laterality Assessment & Plan: Chronic issue.  I will refer her to orthopedics in [facility] to see if they have any other thoughts for his chronic toe pain.  Orders: -     Ambulatory referral to Orthopedic Surgery  Iron deficiency Assessment & Plan: Discussed with the patient that the typical workup for iron deficiency includes GI workup with an upper endoscopy and colonoscopy to evaluate for blood loss.  Patient feels he does not need this at this time as he is not having any symptoms or issues that would indicate blood loss.  Discussed that he may not be able to see any blood in his stool though could still be losing blood.  He understands  the risk of missing a significant lesion without having the workup.  He defers workup at this time.  Patient needs to start on iron supplementation though he has a red dye allergy.  Will check with our clinical pharmacist to see if there is a specific formulation of iron supplementation that may be good for him.  If you are able to get him  on iron supplementation we can recheck his labs a month later.   B12 deficiency -     Cyanocobalamin  Other orders -     Pregabalin; TAKE 1 CAPSULE(200 MG) BY MOUTH TWICE DAILY  Dispense: 180 capsule; Refill: 1    Return in about 3 months (around 07/17/2022).   [name], MD [place]

## 2022-04-17 NOTE — Assessment & Plan Note (Signed)
Chronic issue.  I will refer her to orthopedics in [facility] to see if they have any other thoughts for his chronic toe pain.

## 2022-04-17 NOTE — Progress Notes (Signed)
Patient presented for B 12 injection to left deltoid, patient voiced no concerns nor showed any signs of distress during injection Patient presented for B 12 injection to left deltoid, patient voiced no concerns nor showed any signs of distress during injection.  Douglas [name],cma

## 2022-04-17 NOTE — Assessment & Plan Note (Addendum)
Discussed with the patient that the typical workup for iron deficiency includes GI workup with an upper endoscopy and colonoscopy to evaluate for blood loss.  Patient feels he does not need this at this time as he is not having any symptoms or issues that would indicate blood loss.  Discussed that he may not be able to see any blood in his stool though could still be losing blood.  He understands the risk of missing a significant lesion without having the workup.  He defers workup at this time.  Patient needs to start on iron supplementation though he has a red dye allergy.  Will check with our clinical pharmacist to see if there is a specific formulation of iron supplementation that may be good for him.  If you are able to get him on iron supplementation we can recheck his labs a month later.

## 2022-04-18 ENCOUNTER — Telehealth: Payer: Self-pay | Admitting: Pharmacy Technician

## 2022-04-18 DIAGNOSIS — Z596 Low income: Secondary | ICD-10-CM

## 2022-04-18 NOTE — Progress Notes (Signed)
[facility])                                            [place] Team    04/18/2022  Joeziah Voit Davidian [birth year] [id number]  Care coordination call placed to [employer] in regard to [place] application.   Spoke to [place] who informs patient is APPROVED 04/14/22-04/14/23. Medication will be delivered to the prescriber's office based on last fill date in 2023.  Keighley Deckman P. Magnus Crescenzo, [place]  [phone]

## 2022-04-21 ENCOUNTER — Other Ambulatory Visit: Payer: Self-pay | Admitting: Family Medicine

## 2022-04-22 ENCOUNTER — Telehealth: Payer: Self-pay | Admitting: Family Medicine

## 2022-04-22 MED ORDER — FERROUS SULFATE 325 (65 FE) MG PO TABS: 325.0000 mg | ORAL_TABLET | Freq: Every day | ORAL | 1 refills | Status: DC

## 2022-04-22 NOTE — Telephone Encounter (Signed)
Please let patient know that I sent an iron supplementation for him to start on.  The pharmacy should be dispensing a red dye free version of this.  He should confirm with the pharmacy that it is red dye free when he picks it up.

## 2022-04-22 NOTE — Telephone Encounter (Signed)
-----   Message from Osker Mason, [place] sent at 04/21/2022  8:53 AM EST ----- Hey,   Looks like the [employer] brand of ferrous sulfate is dye free. I'd just write "please dispense red dye-free formulation" in note to pharmacy, and they can grab and use one that he can take.   Catie ----- Message ----- From: [name], MD Sent: 04/17/2022   4:24 PM EST To: Osker Mason, RPH-CPP  Hey,   I tried to prescribe ferrous sulfate for this patient for iron deficiency.  He has red dye allergy.  Is there an iron supplement formulation that does not have red dye in it?  Thanks.

## 2022-04-23 NOTE — Telephone Encounter (Signed)
I called the patient on yesterday and informed him of this message and he understood.  Douglas [name],cma

## 2022-05-05 ENCOUNTER — Ambulatory Visit (INDEPENDENT_AMBULATORY_CARE_PROVIDER_SITE_OTHER): Payer: PPO

## 2022-05-05 DIAGNOSIS — E538 Deficiency of other specified B group vitamins: Secondary | ICD-10-CM | POA: Diagnosis not present

## 2022-05-05 MED ORDER — CYANOCOBALAMIN 1000 MCG/ML IJ SOLN
1000.0000 ug | Freq: Once | INTRAMUSCULAR | Status: AC
  Administered 2022-05-05: 1000 ug via INTRAMUSCULAR

## 2022-05-05 NOTE — Progress Notes (Signed)
Pt presented for their vitamin B12 injection. Pt was identified through two identifiers. Pt tolerated shot well in their left  deltoid.  

## 2022-05-06 ENCOUNTER — Ambulatory Visit (INDEPENDENT_AMBULATORY_CARE_PROVIDER_SITE_OTHER): Payer: PPO | Admitting: Orthopedic Surgery

## 2022-05-06 ENCOUNTER — Telehealth: Payer: Self-pay

## 2022-05-06 DIAGNOSIS — M79675 Pain in left toe(s): Secondary | ICD-10-CM | POA: Diagnosis not present

## 2022-05-06 DIAGNOSIS — M79674 Pain in right toe(s): Secondary | ICD-10-CM | POA: Diagnosis not present

## 2022-05-06 DIAGNOSIS — M216X9 Other acquired deformities of unspecified foot: Secondary | ICD-10-CM | POA: Diagnosis not present

## 2022-05-06 DIAGNOSIS — M2021 Hallux rigidus, right foot: Secondary | ICD-10-CM

## 2022-05-06 DIAGNOSIS — M6702 Short Achilles tendon (acquired), left ankle: Secondary | ICD-10-CM

## 2022-05-06 DIAGNOSIS — M6701 Short Achilles tendon (acquired), right ankle: Secondary | ICD-10-CM

## 2022-05-06 DIAGNOSIS — M2022 Hallux rigidus, left foot: Secondary | ICD-10-CM | POA: Diagnosis not present

## 2022-05-06 NOTE — Telephone Encounter (Signed)
I called and spoke with the patients wife and informed her that the patients Ozempic has arrived and the the company sent in the wrong dose of the tresiba and I had to do a change form and when it arrives I will let him know and she understood.  Kipling Graser,cma

## 2022-05-07 ENCOUNTER — Encounter: Payer: Self-pay | Admitting: Orthopedic Surgery

## 2022-05-07 NOTE — Progress Notes (Signed)
Office Visit Note   Patient: Douglas [name]           Date of Birth: October 11, 1945           MRN: [id number] Visit Date: 05/06/2022              Requested by: [name], MD [address] [place],  [place] [address] PCP: [name], MD  Chief Complaint  Patient presents with   Right Foot - Pain    2nd opinion GT pain   Left Foot - Pain      HPI: Patient is a 77 year old gentleman who is seen for initial evaluation for bilateral great toe pain which she states is chronic.  Patient states he has been followed by podiatry.  He states he has pain around the great toenail bilaterally and states he has had his toenails removed.  Patient states he also has lateral sided foot pain and arch pain and stinging pain in both feet.  Patient states that he is taking medication for neuropathy.  Past medical history positive for diabetes.  Assessment & Plan: Visit Diagnoses:  1. Toe pain, bilateral   2. Acquired cavovarus deformity of foot, unspecified laterality   3. Hallux rigidus, bilateral   4. Achilles tendon contracture, bilateral     Plan: Recommended Achilles stretching this was demonstrated.  Recommended modification of the orthotic the area underneath the first metatarsal head was resected to allow for plantarflexion of the first ray and to get the hindfoot out of varus.  Discussed that we may need to do some lateral posting.  Recommended wool socks for the fungal dermatitis.  Follow-Up Instructions: Return in about 3 months (around 08/05/2022).   Ortho Exam  Patient is alert, oriented, no adenopathy, well-dressed, normal affect, normal respiratory effort. Examination patient has a fixed cavovarus deformity bilaterally with a plantarflexed first ray and hallux rigidus with dorsiflexion of the great toes of only 45 degrees bilaterally he has clawing of the toes he has a good dorsalis pedis pulse bilaterally both great toe nails have been excised and there is  no evidence of a paronychial infection no redness no cellulitis no drainage.  He has a fungal dermatitis.  Review of radiographs of both feet show osteoarthritis with bony spurs throughout the midfoot.  Imaging: No results found. No images are attached to the encounter.  Labs: Lab Results  Component Value Date   HGBA1C 6.5 03/05/2022   HGBA1C 6.5 05/14/2021   HGBA1C 6.6 (H) 12/19/2020   LABURIC 6.7 03/05/2022   LABURIC 5.0 11/12/2018   LABURIC 4.9 11/17/2016     Lab Results  Component Value Date   ALBUMIN 4.1 03/05/2022   ALBUMIN 3.9 12/19/2020   ALBUMIN 4.1 11/23/2019    No results found for: "MG" Lab Results  Component Value Date   VD25OH 83 05/02/2013    No results found for: "PREALBUMIN"    Latest Ref Rng & Units 03/05/2022   11:30 AM 01/19/2019    7:20 PM 09/10/2017    5:47 PM  CBC EXTENDED  WBC 4.0 - 10.5 K/uL 7.0  10.6  9.5   RBC 4.22 - 5.81 Mil/uL 4.79  4.10  4.10   Hemoglobin 13.0 - 17.0 g/dL 12.1  11.8  11.7   HCT 39.0 - 52.0 % 38.3  36.6  36.0   Platelets 150.0 - 400.0 K/uL 224.0  258  210   NEUT# 1.4 - 7.7 K/uL 3.8     Lymph# 0.7 -  4.0 K/uL 2.0        There is no height or weight on file to calculate BMI.  Orders:  No orders of the defined types were placed in this encounter.  No orders of the defined types were placed in this encounter.    Procedures: No procedures performed  Clinical Data: No additional findings.  ROS:  All other systems negative, except as noted in the HPI. Review of Systems  Objective: Vital Signs: There were no vitals taken for this visit.  Specialty Comments:  No specialty comments available.  PMFS History: Patient Active Problem List   Diagnosis Date Noted   Iron deficiency 04/17/2022   Pain of great toe 04/17/2022   Erectile dysfunction 03/05/2022   Tendinopathy of left rotator cuff 11/18/2021   Toe infection 08/12/2021   Abnormal results of liver function studies 08/10/2020   Esophageal reflux  08/10/2020   Low back pain 08/10/2020   Other testicular hypofunction 08/10/2020   Restless legs syndrome 08/10/2020   Skin exam, screening for cancer 03/02/2020   Anemia in chronic kidney disease 02/18/2019   Secondary hyperparathyroidism of renal origin ([place]) 02/18/2019   Right kidney mass 01/20/2019   Erectile dysfunction due to arterial insufficiency 11/09/2018   Respiratory illness 11/05/2018   Tick bite of knee, initial encounter 08/17/2018   Left shoulder pain 07/02/2018   Allergic rhinitis 07/02/2018   Cervical radiculopathy 11/20/2017   Toe pain, bilateral 11/20/2017   Abdominal pain 02/18/2017   Clavicle enlargement 02/18/2017   Anxiety 02/18/2017   Degenerative arthritis of knee, bilateral 09/04/2016   BPH (benign prostatic hyperplasia) 08/18/2016   Knee osteoarthritis 08/18/2016   Hyperlipidemia 05/20/2016   Allergic conjunctivitis 08/30/2015   Essential hypertension 05/31/2015   Low testosterone 02/27/2015   Skin lesion 11/24/2014   Diabetic neuropathy ([place]) 01/05/2013   Obesity (BMI 30-39.9) 01/05/2013   Chronic back pain greater than 3 months duration 01/05/2013   Gout 01/05/2013   OSA (obstructive sleep apnea) 01/05/2013   Chronic kidney disease 01/04/2013   DM type 2 with diabetic peripheral neuropathy ([place]) 01/04/2013   Hypothyroidism 01/04/2013   Past Medical History:  Diagnosis Date   Arthritis    Depression    Diabetes mellitus with complication ([place])    Diabetic neuropathy (H[facility])    Diastolic dysfunction    a. TTE 07/2017: EF 60-65%, mild concentric LVH, no RWMA, Gr1DD, trivial AI, mildly dilated LA, RVSF normal, PASP normal   Edema    feet/legs   GERD (gastroesophageal reflux disease)    Gout    History of stress test    a. MV 06/2017: small in size, mild in severity, apical anterior and apical defect that was minimally reversible and most likely represented artifact and less likely ischemia/scar, LVEF 55-65%, low risk, probably normal stress test    Hypertension    Hypothyroidism    Kidney cysts    renal failure 2013   Kidney stones    Dr. Rogers Blocker   OSA (obstructive sleep apnea)    supplemental oxygen at night   Oxygen dependent    hs   [facility] spotted fever 12/10/2017   Positve IgG in titer on 11/20/17   S/P cardiac cath 1998   a. no obstructive disease   Syncope and collapse    Temporary low platelet count ([place])     Family History  Problem Relation Age of Onset   Breast cancer Mother    Lung cancer Mother    Bone cancer Mother  Heart Problems Mother    Cancer Mother        breast, lung and rib   AAA (abdominal aortic aneurysm) Mother    Heart disease Mother    Heart attack Father    Heart disease Father    Cancer Brother        esophageal   Heart attack Brother    Colon cancer Neg Hx     Past Surgical History:  Procedure Laterality Date   ANAL FISSURE REPAIR     BACK SURGERY  1977   rupture disc lumbar spine   [facility].    CATARACT EXTRACTION W/PHACO Right 07/16/2016   Procedure: CATARACT EXTRACTION PHACO AND INTRAOCULAR LENS PLACEMENT (IOC);  Surgeon: [name], MD;  Location: [facility] ORS;  Service: Ophthalmology;  Laterality: Right;  [place] 01:11 AP% 17.6 CDE 24.83 fluid pack lot # [id number]   CATARACT EXTRACTION W/PHACO Left 08/13/2016   Procedure: CATARACT EXTRACTION PHACO AND INTRAOCULAR LENS PLACEMENT ([place]) suture placed in left eye at end of procedure;  Surgeon: [name], MD;  Location: [facility] ORS;  Service: Ophthalmology;  Laterality: Left;  [place] 01:55 AP% 22.7 CDE 53.56 fluid pack lot # [id number]   FINGER AMPUTATION     partial   KNEE SURGERY  1993   arthroscopy   SHOULDER SURGERY Bilateral 1998   arthroscopic right, rotator cuff repair left   Social History   Occupational History    Comment: retired  Tobacco Use   Smoking status: Never   Smokeless tobacco: Never  Vaping Use   Vaping Use: Never used   Substance and Sexual Activity   Alcohol use: No   Drug use: No   Sexual activity: Yes

## 2022-05-16 ENCOUNTER — Telehealth: Payer: Self-pay

## 2022-05-16 NOTE — Telephone Encounter (Signed)
PAP form is in the sign basket for provider signature and refill ordered.  Kebin Maye,cma

## 2022-05-19 ENCOUNTER — Ambulatory Visit (INDEPENDENT_AMBULATORY_CARE_PROVIDER_SITE_OTHER): Payer: PPO

## 2022-05-19 DIAGNOSIS — E538 Deficiency of other specified B group vitamins: Secondary | ICD-10-CM | POA: Diagnosis not present

## 2022-05-19 MED ORDER — CYANOCOBALAMIN 1000 MCG/ML IJ SOLN
1000.0000 ug | Freq: Once | INTRAMUSCULAR | Status: AC
  Administered 2022-05-19: 1000 ug via INTRAMUSCULAR

## 2022-05-19 NOTE — Progress Notes (Signed)
Pt presented for their vitamin B12 injection. Pt was identified through two identifiers. Pt tolerated shot well in their right deltoid.  

## 2022-05-19 NOTE — Telephone Encounter (Signed)
Faxed the form to [employer] and confirmation was given.  Jennilyn Esteve,cma

## 2022-05-19 NOTE — Telephone Encounter (Signed)
Signed. Please fax.

## 2022-06-09 ENCOUNTER — Ambulatory Visit: Payer: PPO | Admitting: Family Medicine

## 2022-06-10 ENCOUNTER — Telehealth: Payer: Self-pay

## 2022-06-10 NOTE — Telephone Encounter (Signed)
Called to let Patient know we received 1 box of Novofine 32 G needles of 100 count and I box of [name] 100 units/mL- 5 x 3 prefilled pens from [employer] that he needs to pick up. Inject 40 units of [name] into the skin daily.

## 2022-06-10 NOTE — Telephone Encounter (Signed)
Error

## 2022-06-10 NOTE — Telephone Encounter (Signed)
Patient's wife came and picked up the 1 box of Novofine 32 G needles and the 1 box of [name] 100 units/mL of 5 x 3 prefilled pens.

## 2022-06-19 ENCOUNTER — Ambulatory Visit (INDEPENDENT_AMBULATORY_CARE_PROVIDER_SITE_OTHER): Payer: PPO

## 2022-06-19 DIAGNOSIS — E538 Deficiency of other specified B group vitamins: Secondary | ICD-10-CM | POA: Diagnosis not present

## 2022-06-19 MED ORDER — CYANOCOBALAMIN 1000 MCG/ML IJ SOLN
1000.0000 ug | Freq: Once | INTRAMUSCULAR | Status: AC
  Administered 2022-06-19: 1000 ug via INTRAMUSCULAR

## 2022-06-19 NOTE — Progress Notes (Signed)
Patient arrived for him  B12 injection. Patient was administered him B12 into her left deltoid. Patient tolerated the injection well and did not show any signs of distress or voice any concerns.

## 2022-06-23 ENCOUNTER — Other Ambulatory Visit: Payer: Self-pay | Admitting: Family Medicine

## 2022-07-09 ENCOUNTER — Telehealth: Payer: Self-pay

## 2022-07-09 NOTE — Telephone Encounter (Signed)
I called and spoke with the patients wife and informed her that his medication tresiba came in and it can be picked up today before 4 pm.  She understood. Kristle Wesch,cma

## 2022-07-10 NOTE — Telephone Encounter (Signed)
Pt has picked up medication.  

## 2022-07-21 ENCOUNTER — Encounter: Payer: Self-pay | Admitting: Family Medicine

## 2022-07-21 ENCOUNTER — Telehealth: Payer: Self-pay | Admitting: Family Medicine

## 2022-07-21 ENCOUNTER — Ambulatory Visit (INDEPENDENT_AMBULATORY_CARE_PROVIDER_SITE_OTHER): Payer: PPO | Admitting: Family Medicine

## 2022-07-21 VITALS — BP 122/76 | HR 80 | Temp 98.0°F | Ht 72.0 in | Wt 257.2 lb

## 2022-07-21 DIAGNOSIS — I10 Essential (primary) hypertension: Secondary | ICD-10-CM

## 2022-07-21 DIAGNOSIS — E1142 Type 2 diabetes mellitus with diabetic polyneuropathy: Secondary | ICD-10-CM

## 2022-07-21 DIAGNOSIS — S90422A Blister (nonthermal), left great toe, initial encounter: Secondary | ICD-10-CM

## 2022-07-21 DIAGNOSIS — E538 Deficiency of other specified B group vitamins: Secondary | ICD-10-CM | POA: Diagnosis not present

## 2022-07-21 DIAGNOSIS — E039 Hypothyroidism, unspecified: Secondary | ICD-10-CM | POA: Diagnosis not present

## 2022-07-21 MED ORDER — PREGABALIN 200 MG PO CAPS: ORAL_CAPSULE | ORAL | 1 refills | Status: DC

## 2022-07-21 MED ORDER — LEVOTHYROXINE SODIUM 100 MCG PO TABS: ORAL_TABLET | ORAL | 1 refills | Status: DC

## 2022-07-21 MED ORDER — LOSARTAN POTASSIUM 25 MG PO TABS: ORAL_TABLET | ORAL | 3 refills | Status: DC

## 2022-07-21 MED ORDER — CYANOCOBALAMIN 1000 MCG/ML IJ SOLN
1000.0000 ug | Freq: Once | INTRAMUSCULAR | Status: AC
  Administered 2022-07-21: 1000 ug via INTRAMUSCULAR

## 2022-07-21 MED ORDER — FUROSEMIDE 40 MG PO TABS: ORAL_TABLET | ORAL | 1 refills | Status: DC

## 2022-07-21 NOTE — Assessment & Plan Note (Signed)
Recent onset issue.  No signs of infection.  Patient verbally consented to debridement of dead skin tissue from the blister.  The area was cleansed with alcohol swabs first and then debrided using sterile scissors.  Patient tolerated this well and had no adverse effects.  Once completed this was cleansed with alcohol swabs again.  It was wrapped with a nonstick pad.  I advised the patient to keep the area cleansed with soap and water.  Discussed applying Neosporin when he makes dressing changes twice daily though after the visit I thought best to let the patient know to use Vaseline after discussing with our clinic RN who noted wound care preferred Vaseline.  The patient will be called to let him know this.  He will monitor for signs of infection and he will seek medical attention if he develops signs of infection.  He was referred to wound care urgently.

## 2022-07-21 NOTE — Telephone Encounter (Signed)
Please let the patient know that instead of the antibiotic ointment he should use Vaseline on his wound.

## 2022-07-21 NOTE — Progress Notes (Signed)
Patient was administered a B12 injection into his Right Deltoid. Patient tolerated the injection well and did not show any signs of distress or voice any concerns.

## 2022-07-21 NOTE — Assessment & Plan Note (Signed)
Chronic issue.  Adequately controlled today.  Continue Cardura 4 mg daily, carvedilol 6.25 mg twice daily, and Lasix 40 mg daily.

## 2022-07-21 NOTE — Patient Instructions (Signed)
Nice to see you. Please cleanse your toe with soap and water.  Please do not soak this at this time.  He is do not use peroxide on this. You can keep the area bandaged though you need to change the bandage at least twice a day and do not tie it too tight.   I have referred you to wound care.  If they do not call you this week please let us know. If you develop any spreading redness, pain, drainage of pus, fevers, or any new symptoms related to your toe please seek medical attention immediately.

## 2022-07-21 NOTE — Assessment & Plan Note (Signed)
Chronic issue.  Last A1c was well-controlled.  Plan for labs at next visit.  Patient will continue [name] 40 units daily, [name] 10 mg daily, metformin 1000 mg twice daily, and Ozempic 1 mg weekly.

## 2022-07-21 NOTE — Assessment & Plan Note (Signed)
Chronic issue.  Check TSH at next visit.  Continue Synthroid 100 mcg daily.

## 2022-07-21 NOTE — Telephone Encounter (Signed)
Spoke to Patient regarding Vaseline. Patient states "good that is what he has been using." Patient has an appointment at the wound center on 4/25.

## 2022-07-21 NOTE — Progress Notes (Signed)
[name], MD Phone: [phone]  Douglas [name] is a 77 y.o. male who presents today for follow-up.  HYPERTENSION Disease Monitoring Home BP Monitoring not checking Chest pain- no    Dyspnea- no Medications Compliance-  taking coreg, cardura, lasix  Edema- no BMET    Component Value Date/Time   NA 138 03/05/2022 1130   NA 140 11/12/2018 1142   NA 142 12/04/2012 0414   K 4.2 03/05/2022 1130   K 4.4 12/04/2012 0414   CL 101 03/05/2022 1130   CL 110 (H) 12/04/2012 0414   CO2 28 03/05/2022 1130   CO2 26 12/04/2012 0414   GLUCOSE 94 03/05/2022 1130   GLUCOSE 116 (H) 12/04/2012 0414   BUN 17 03/05/2022 1130   BUN 13 11/12/2018 1142   BUN 32 (H) 12/04/2012 0414   CREATININE 1.52 (H) 03/05/2022 1130   CREATININE 1.30 (H) 11/17/2016 1012   CALCIUM 8.5 03/05/2022 1130   CALCIUM 9.1 12/04/2012 0414   GFRNONAA 46 (L) 01/19/2019 1920   GFRNONAA 28 (L) 12/04/2012 0414   GFRAA 53 (L) 01/19/2019 1920   GFRAA 32 (L) 12/04/2012 0414   DIABETES Disease Monitoring: Blood Sugar ranges-not checking Polyuria/phagia/dipsia- no      Medications: Compliance- taking farxiga, tresiba, metformin, ozempic Hypoglycemic symptoms- notes occasionally gets tired in the afternoon and eats and feels better  HYPOTHYROIDISM Disease Monitoring Weight changes: no  Skin Changes: no Heat/Cold intolerance: no  Medication Monitoring Compliance:  taking synthroid   Last TSH:   Lab Results  Component Value Date   TSH 1.19 05/14/2021   Toe blister: Patient notes he was having some discomfort in his left great toe and he put Coban on it.  He notes this was on for 32 hours and then he thought to take it off and the toe was blue at first when he got in the shower pinked back up.  He notes it then blistered and the skin came off of part of the still has some skin on it.  He notes no pain.  It does sting.   Social History   Tobacco Use  Smoking Status Never  Smokeless Tobacco Never     Current Outpatient Medications on File Prior to Visit  Medication Sig Dispense Refill   acetaminophen (TYLENOL) 500 MG tablet Take 500 mg by mouth every 6 (six) hours as needed.     aspirin 81 MG tablet Take 1 tablet (81 mg total) by mouth daily.     B Complex Vitamins (BL VITAMIN B COMPLEX PO) Take by mouth daily.     calcitRIOL (ROCALTROL) 0.25 MCG capsule Take 1 capsule (0.25 mcg total) by mouth daily. 30 capsule 5   carvedilol (COREG) 6.25 MG tablet TAKE 1 TABLET(6.25 MG) BY MOUTH TWICE DAILY WITH A MEAL 180 tablet 0   Cholecalciferol (VITAMIN D3 PO) Take by mouth daily.     clotrimazole-betamethasone (LOTRISONE) cream Apply 1 application. topically 2 (two) times daily. 45 g 1   clove oil liquid Apply 1 application topically as needed.     cyclobenzaprine (FLEXERIL) 10 MG tablet Take 1 tablet by mouth as needed.     dapagliflozin propanediol ([name]) 10 MG TABS tablet Take 1 tablet (10 mg total) by mouth daily. 90 tablet 3   doxazosin (CARDURA) 4 MG tablet TAKE 1 TABLET(4 MG) BY MOUTH DAILY 90 tablet 1   DULoxetine (CYMBALTA) 60 MG capsule TAKE 1 CAPSULE(60 MG) BY MOUTH DAILY 90 capsule 3   esomeprazole (NEXIUM) 40 MG capsule Take  1 capsule (40 mg total) by mouth daily at 12 noon. 90 capsule 3   ferrous sulfate 325 (65 FE) MG tablet Take 1 tablet (325 mg total) by mouth daily with breakfast. Please dispense dye free formulation. 90 tablet 1   finasteride (PROSCAR) 5 MG tablet TAKE 1 TABLET(5 MG) BY MOUTH AT BEDTIME 90 tablet 3   gentamicin cream (GARAMYCIN) 0.1 % Apply 1 application. topically 2 (two) times daily. 30 g 1   HYDROcodone-acetaminophen (NORCO/VICODIN) 5-325 MG tablet Take 1 tablet by mouth every 6 (six) hours as needed for moderate pain. 30 tablet 0   insulin degludec ([name] FLEXTOUCH) 200 UNIT/ML FlexTouch Pen Inject 40 Units into the skin daily. 15 mL 3   Insulin Pen Needle (NOVOFINE PEN NEEDLE) 32G X 6 MM MISC 1 each by Does not apply route 2 (two) times daily. 1 each  3   loratadine (CLARITIN) 10 MG tablet Take 10 mg by mouth daily as needed for allergies.     MAGNESIUM PO Take by mouth daily at 8 pm.     meloxicam (MOBIC) 15 MG tablet Take 1 tablet (15 mg total) by mouth daily. 30 tablet 1   metFORMIN (GLUCOPHAGE-XR) 500 MG 24 hr tablet TAKE 2 TABLETS(1000 MG) BY MOUTH TWICE DAILY 360 tablet 3   mometasone (NASONEX) 50 MCG/ACT nasal spray Place 2 sprays into the nose daily. 1 each 12   Multiple Vitamins-Minerals (MULTIVITAMIN MEN PO) Take 1 tablet by mouth daily.     Needle, Disp, (HYPODERMIC NEEDLE 18GX1") 18G X 1" MISC Use every 14 days to draw up testosterone     Needle, Disp, (HYPODERMIC NEEDLE 25GX1") 25G X 1" MISC Use every 14 days to administer testosterone     Semaglutide, 1 MG/DOSE, (OZEMPIC, 1 MG/DOSE,) 4 MG/3ML SOPN Inject 1 mg into the skin once a week. 3 mL 3   simvastatin (ZOCOR) 40 MG tablet TAKE 1 TABLET(40 MG) BY MOUTH AT BEDTIME 90 tablet 3   sulfamethoxazole-trimethoprim (BACTRIM DS) 800-160 MG tablet Take 1 tablet by mouth 2 (two) times daily. 14 tablet 0   tadalafil (CIALIS) 20 MG tablet Take by mouth.     testosterone cypionate (DEPOTESTOSTERONE CYPIONATE) 200 MG/ML injection Inject 200 mg into the muscle every 14 (fourteen) days.     No current facility-administered medications on file prior to visit.     ROS see history of present illness  Objective  Physical Exam Vitals:   07/21/22 1211 07/21/22 1226  BP: 126/82 122/76  Pulse: 80   Temp: 98 F (36.7 C)   SpO2: 99%     BP Readings from Last 3 Encounters:  07/21/22 122/76  04/17/22 110/70  03/05/22 118/78   Wt Readings from Last 3 Encounters:  07/21/22 257 lb 3.2 oz (116.7 kg)  04/17/22 263 lb (119.3 kg)  03/05/22 262 lb 12.8 oz (119.2 kg)    Physical Exam Constitutional:      General: He is not in acute distress.    Appearance: He is not diaphoretic.  Cardiovascular:     Rate and Rhythm: Normal rate and regular rhythm.     Heart sounds: Normal heart  sounds.  Pulmonary:     Effort: Pulmonary effort is normal.     Breath sounds: Normal breath sounds.  Skin:    General: Skin is warm and dry.  Neurological:     Mental Status: He is alert.      Before skin debridement   Before skin debridement   After skin debridement  After skin debridement  Assessment/Plan: Please see individual problem list.  Essential hypertension Assessment & Plan: Chronic issue.  Adequately controlled today.  Continue Cardura 4 mg daily, carvedilol 6.25 mg twice daily, and Lasix 40 mg daily.   DM type 2 with diabetic peripheral neuropathy Assessment & Plan: Chronic issue.  Last A1c was well-controlled.  Plan for labs at next visit.  Patient will continue [name] 40 units daily, [name] 10 mg daily, metformin 1000 mg twice daily, and Ozempic 1 mg weekly.   Hypothyroidism, unspecified type Assessment & Plan: Chronic issue.  Check TSH at next visit.  Continue Synthroid 100 mcg daily.  Orders: -     Levothyroxine Sodium; TAKE 1 TABLET(100 MCG) BY MOUTH DAILY BEFORE BREAKFAST  Dispense: 90 tablet; Refill: 1 -     TSH  B12 deficiency -     Cyanocobalamin  Blister (nonthermal), left great toe, initial encounter Assessment & Plan: Recent onset issue.  No signs of infection.  Patient verbally consented to debridement of dead skin tissue from the blister.  The area was cleansed with alcohol swabs first and then debrided using sterile scissors.  Patient tolerated this well and had no adverse effects.  Once completed this was cleansed with alcohol swabs again.  It was wrapped with a nonstick pad.  I advised the patient to keep the area cleansed with soap and water.  Discussed applying Neosporin when he makes dressing changes twice daily though after the visit I thought best to let the patient know to use Vaseline after discussing with our clinic RN who noted wound care preferred Vaseline.  The patient will be called to let him know this.  He will monitor  for signs of infection and he will seek medical attention if he develops signs of infection.  He was referred to wound care urgently.  Orders: -     Ambulatory referral to Wound Clinic  Other orders -     Furosemide; TAKE 1 TABLET(40 MG) BY MOUTH DAILY  Dispense: 90 tablet; Refill: 1 -     Losartan Potassium; TAKE 1 TABLET BY MOUTH EVERY MORNING THEN TAKE 2 TABLETS BY MOUTH EVERY EVENING  Dispense: 270 tablet; Refill: 3 -     Pregabalin; TAKE 1 CAPSULE(200 MG) BY MOUTH TWICE DAILY  Dispense: 180 capsule; Refill: 1    Return in about 3 months (around 10/20/2022).  I have spent 45 minutes in the care of this patient regarding history taking, documentation, completion of exam, discussion of plan, placing orders, completing debridement of his blister.   Marikay AlarEric Kairi Harshbarger, MD [facility]

## 2022-07-24 ENCOUNTER — Encounter: Payer: PPO | Attending: Physician Assistant | Admitting: Physician Assistant

## 2022-07-24 DIAGNOSIS — E11621 Type 2 diabetes mellitus with foot ulcer: Secondary | ICD-10-CM | POA: Insufficient documentation

## 2022-07-24 DIAGNOSIS — E1140 Type 2 diabetes mellitus with diabetic neuropathy, unspecified: Secondary | ICD-10-CM | POA: Diagnosis not present

## 2022-07-24 DIAGNOSIS — I10 Essential (primary) hypertension: Secondary | ICD-10-CM | POA: Diagnosis not present

## 2022-07-24 DIAGNOSIS — L97522 Non-pressure chronic ulcer of other part of left foot with fat layer exposed: Secondary | ICD-10-CM | POA: Insufficient documentation

## 2022-07-25 NOTE — Progress Notes (Addendum)
LABON, FONT ([id number]) [id number] Page 1 of 10 Visit Report for 07/24/2022 Allergy List Details Patient Name: Date of Service: Douglas [name], Douglas [name] RD L. 07/24/2022 2:00 PM Medical Record Number: [id number] Patient Account Number: [id number] Date of Birth/Sex: Treating RN: [birth year] (77 y.o. Roel Cluck Primary Care Odie Edmonds: [name] Other Clinician: Referring Taji Sather: Treating Zian Delair/Extender: [name] in Treatment: 0 Allergies Active Allergies esomeprazole Severity: Moderate atenolol Severity: Moderate enalapril gabapentin HCTZ Severity: Moderate Type: Medication Allergy Notes Electronic Signature(s) Signed: 07/25/2022 12:55:54 PM By: Midge Aver MSN RN CNS WTA Entered By: Midge Aver on 07/24/2022 11:38:56 -------------------------------------------------------------------------------- Arrival Information Details Patient Name: Date of Service: Douglas [name] RD L. 07/24/2022 2:00 PM Medical Record Number: [id number] Patient Account Number: [id number] Date of Birth/Sex: Treating RN: [birth year] (77 y.o. Roel Cluck Primary Care Kasean Denherder: [name] Other Clinician: Referring Khalid Lacko: Treating Jonerik Sliker/Extender: [name] in Treatment: 0 Visit Information Patient Arrived: Ambulatory Arrival Time: 14:31 ANDREJ, CROFTS ([id number]) [id number].pdf Page 2 of 10 Accompanied By: self Transfer Assistance: None Patient Identification Verified: Yes Secondary Verification Process Completed: Yes Patient Requires Transmission-Based Precautions: No Patient Has Alerts: Yes Patient Alerts: Diabetes type 2 ASA 325mg  Electronic Signature(s) Signed: 07/25/2022 12:55:54 PM By: Midge Aver MSN RN CNS WTA Entered By: Midge Aver on 07/24/2022 11:34:02 -------------------------------------------------------------------------------- Clinic Level of  Care Assessment Details Patient Name: Date of Service: SOLOMON, LILLQUIST RD L. 07/24/2022 2:00 PM Medical Record Number: [id number] Patient Account Number: [id number] Date of Birth/Sex: Treating RN: December 30, 1945 (77 y.o. Roel Cluck Primary Care Lendora Keys: [name] Other Clinician: Referring Khalis Hittle: Treating Kaylianna Detert/Extender: [name] in Treatment: 0 Clinic Level of Care Assessment Items TOOL 1 Quantity Score X- 1 0 Use when EandM and Procedure is performed on INITIAL visit ASSESSMENTS - Nursing Assessment / Reassessment X- 1 20 General Physical Exam (combine w/ comprehensive assessment (listed just below) when performed on new pt. evals) X- 1 25 Comprehensive Assessment (HX, ROS, Risk Assessments, Wounds Hx, etc.) ASSESSMENTS - Wound and Skin Assessment / Reassessment []  - 0 Dermatologic / Skin Assessment (not related to wound area) ASSESSMENTS - Ostomy and/or Continence Assessment and Care []  - 0 Incontinence Assessment and Management []  - 0 Ostomy Care Assessment and Management (repouching, etc.) PROCESS - Coordination of Care X - Simple Patient / Family Education for ongoing care 1 15 []  - 0 Complex (extensive) Patient / Family Education for ongoing care X- 1 10 Staff obtains Chiropractor, Records, T Results / Process Orders est []  - 0 Staff telephones HHA, Nursing Homes / Clarify orders / etc []  - 0 Routine Transfer to another Facility (non-emergent condition) []  - 0 Routine Hospital Admission (non-emergent condition) X- 1 15 New Admissions / Manufacturing engineer / Ordering NPWT Apligraf, etc. , []  - 0 Emergency Hospital Admission (emergent condition) PROCESS - Special Needs []  - 0 Pediatric / Minor Patient Management []  - 0 Isolation Patient Management []  - 0 Hearing / Language / Visual special needs []  - 0 Assessment of Community assistance (transportation, D/C planning, etc.) ZAITH, HURD ([id number])  [id number] Page 3 of 10 []  - 0 Additional assistance / Altered mentation []  - 0 Support Surface(s) Assessment (bed, cushion, seat, etc.) INTERVENTIONS - Miscellaneous []  - 0 External ear exam []  - 0 Patient Transfer (multiple staff / Nurse, adult / Similar devices) []  - 0 Simple Staple / Suture removal (25 or less) []  - 0 Complex Staple / Suture removal (26 or more) []  - 0 Hypo/Hyperglycemic Management (  do not check if billed separately) X- 1 15 Ankle / Brachial Index (ABI) - do not check if billed separately Has the patient been seen at the hospital within the last three years: Yes Total Score: 100 Level Of Care: New/Established - Level 3 Electronic Signature(s) Signed: 07/30/2022 4:41:30 PM By: Midge Aver MSN RN CNS WTA Previous Signature: 07/25/2022 12:55:54 PM Version By: Midge Aver MSN RN CNS WTA Entered By: Midge Aver on 07/30/2022 05:26:48 -------------------------------------------------------------------------------- Encounter Discharge Information Details Patient Name: Date of Service: Douglas [name] RD L. 07/24/2022 2:00 PM Medical Record Number: [id number] Patient Account Number: [id number] Date of Birth/Sex: Treating RN: 1946-03-03 (77 y.o. Roel Cluck Primary Care Ieesha Abbasi: [name] Other Clinician: Referring Breshay Ilg: Treating Jeanet Lupe/Extender: [name] in Treatment: 0 Encounter Discharge Information Items Discharge Condition: Stable Ambulatory Status: Ambulatory Discharge Destination: Home Transportation: Private Auto Accompanied By: self Schedule Follow-up Appointment: Yes Clinical Summary of Care: Electronic Signature(s) Signed: 12/12/2022 2:47:14 PM By: Midge Aver MSN RN CNS WTA Previous Signature: 07/24/2022 4:29:46 PM Version By: Midge Aver MSN RN CNS WTA Entered By: Midge Aver on 07/31/2022 05:12:56 Douglas [name] Bienenstock ([id number]) [id number] Page 4  of 10 -------------------------------------------------------------------------------- Lower Extremity Assessment Details Patient Name: Date of Service: TAKI, CURRIER RD L. 07/24/2022 2:00 PM Medical Record Number: [id number] Patient Account Number: [id number] Date of Birth/Sex: Treating RN: 1946/02/27 (77 y.o. Roel Cluck Primary Care Peaches Vanoverbeke: [name] Other Clinician: Referring Renly Roots: Treating Shelva Hetzer/Extender: [name] in Treatment: 0 Edema Assessment Assessed: Kyra Searles: Yes] [name]: Yes] [Left: Edema] [Right: :] Calf Left: Right: Point of Measurement: 35 cm From Medial Instep 39.4 cm 36 cm Ankle Left: Right: Point of Measurement: 12 cm From Medial Instep 25.3 cm 23.4 cm Knee To Floor Left: Right: From Medial Instep 43 cm 43 cm Vascular Assessment Pulses: Dorsalis Pedis Palpable: [Left:Yes] [Right:Yes] Blood Pressure: Brachial: [Left:135] [Right:135] Ankle: [Left:Dorsalis Pedis: 168 1.24] [Right:Dorsalis Pedis: 172 1.27] Electronic Signature(s) Signed: 07/24/2022 4:25:02 PM By: Midge Aver MSN RN CNS WTA Entered By: Midge Aver on 07/24/2022 13:25:02 -------------------------------------------------------------------------------- Multi Wound Chart Details Patient Name: Date of Service: Douglas [name] RD L. 07/24/2022 2:00 PM Medical Record Number: [id number] Patient Account Number: [id number] Date of Birth/Sex: Treating RN: 11-Sep-1945 (76 y.o. Roel Cluck Primary Care Gerene Nedd: [name] Other Clinician: Referring Sierra Bissonette: Treating Kylie Gros/Extender: [name] in Treatment: 0 Vital Signs Height(in): 72 Pulse(bpm): 75 Weight(lbs): 253 Blood Pressure(mmHg): 134/78 Body Mass Index(BMI): 34.3 Temperature(F): 98.2 Respiratory Rate(breaths/min): [address] ([id number]) [1:Photos:] [N/A:N/A] Left, Circumferential T Great oe N/A N/A Wound Location: Shear/Friction N/A  N/A Wounding Event: Diabetic Wound/Ulcer of the Lower N/A N/A Primary Etiology: Extremity Sleep Apnea, Hypertension, Type II N/A N/A Comorbid History: Diabetes, Neuropathy 07/17/2022 N/A N/A Date Acquired: 0 N/A N/A Weeks of Treatment: Open N/A N/A Wound Status: No N/A N/A Wound Recurrence: Yes N/A N/A Pending A mputation on Presentation: 6.4x10.5x0.1 N/A N/A Measurements L x W x D (cm) 52.779 N/A N/A A (cm) : rea 5.278 N/A N/A Volume (cm) : Grade 1 N/A N/A Classification: Medium N/A N/A Exudate A mount: Serosanguineous N/A N/A Exudate Type: red, brown N/A N/A Exudate Color: Medium (34-66%) N/A N/A Granulation A mount: Red, Pink N/A N/A Granulation Quality: Small (1-33%) N/A N/A Necrotic A mount: Fat Layer (Subcutaneous Tissue): Yes N/A N/A Exposed Structures: Fascia: No Tendon: No Muscle: No Joint: No Bone: No Debridement - Selective/Open Wound N/A N/A Debridement: Pre-procedure Verification/Time Out 15:20 N/A N/A Taken: Skin/Epidermis N/A N/A  Level: 67.2 N/A N/A Debridement A (sq cm): rea Curette N/A N/A Instrument: None N/A N/A Bleeding: 0 N/A N/A Procedural Pain: 0 N/A N/A Post Procedural Pain: Procedure was tolerated well N/A N/A Debridement Treatment Response: 6.4x10.5x0.1 N/A N/A Post Debridement Measurements L x W x D (cm) 5.278 N/A N/A Post Debridement Volume: (cm) Debridement N/A N/A Procedures Performed: Treatment Notes Wound #1 (Toe Great) Wound Laterality: Left, Circumferential Cleanser Byram Ancillary Kit - 15 Day Supply Discharge Instruction: Use supplies as instructed; Kit contains: (15) Saline Bullets; (15) 3x3 Gauze; 15 pr Gloves Soap and Water Discharge Instruction: Gently cleanse wound with antibacterial soap, rinse and pat dry prior to dressing wounds Peri-Wound Care Topical Primary Dressing Xeroform 4x4-HBD (in/in) Discharge Instruction: Apply Xeroform 4x4-HBD (in/in) as directed Secondary Dressing ABD Pad 5x9  (in/in) Discharge Instruction: Cover with ABD pad Gauze Discharge Instruction: As directed: dry, moistened with saline or moistened with Dakins Solution Secured With Medipore T - 45M Medipore H Soft Cloth Surgical T ape ape, 2x2 (in/yd) Compression JAAMAL, SANDRA ([id number]) [id number] Page 6 of 10 Compression Stockings Add-Ons Electronic Signature(s) Signed: 07/24/2022 4:28:54 PM By: Midge Aver MSN RN CNS WTA Entered By: Midge Aver on 07/24/2022 13:28:54 -------------------------------------------------------------------------------- Multi-Disciplinary Care Plan Details Patient Name: Date of Service: Douglas [name] RD L. 07/24/2022 2:00 PM Medical Record Number: [id number] Patient Account Number: [id number] Date of Birth/Sex: Treating RN: 22-Nov-1945 (77 y.o. Roel Cluck Primary Care Ashna Dorough: [name] Other Clinician: Referring Jeaninne Lodico: Treating Rennie Rouch/Extender: [name] in Treatment: 0 Active Inactive Necrotic Tissue Nursing Diagnoses: Impaired tissue integrity related to necrotic/devitalized tissue Knowledge deficit related to management of necrotic/devitalized tissue Goals: Necrotic/devitalized tissue will be minimized in the wound bed Date Initiated: 07/24/2022 Target Resolution Date: 08/23/2022 Goal Status: Active Patient/caregiver will verbalize understanding of reason and process for debridement of necrotic tissue Date Initiated: 07/24/2022 Target Resolution Date: 08/23/2022 Goal Status: Active Interventions: Assess patient pain level pre-, during and post procedure and prior to discharge Provide education on necrotic tissue and debridement process Treatment Activities: Excisional debridement : 07/24/2022 Notes: Orientation to the Wound Care Program Nursing Diagnoses: Knowledge deficit related to the wound healing center program Goals: Patient/caregiver will verbalize understanding of  the Wound Healing Center Program Date Initiated: 07/24/2022 Target Resolution Date: 07/24/2022 Goal Status: Active Interventions: Provide education on orientation to the wound center Notes: Peripheral Neuropathy Nursing Diagnoses: DONZEL, CLAVETTE ([id number]) [id number] Page 7 of 10 Knowledge deficit related to disease process and management of peripheral neurovascular dysfunction Potential alteration in peripheral tissue perfusion (select prior to confirmation of diagnosis) Goals: Patient/caregiver will verbalize understanding of disease process and disease management Date Initiated: 07/24/2022 Target Resolution Date: 08/23/2022 Goal Status: Active Interventions: Assess signs and symptoms of neuropathy upon admission and as needed Provide education on Management of Neuropathy and Related Ulcers Provide education on Management of Neuropathy upon discharge from the Wound Center Treatment Activities: Patient referred for customized footwear/offloading : 07/24/2022 Notes: Wound/Skin Impairment Nursing Diagnoses: Impaired tissue integrity Knowledge deficit related to ulceration/compromised skin integrity Goals: Patient/caregiver will verbalize understanding of skin care regimen Date Initiated: 07/24/2022 Target Resolution Date: 08/23/2022 Goal Status: Active Ulcer/skin breakdown will have a volume reduction of 30% by week 4 Date Initiated: 07/24/2022 Target Resolution Date: 08/23/2022 Goal Status: Active Ulcer/skin breakdown will have a volume reduction of 50% by week 8 Date Initiated: 07/24/2022 Target Resolution Date: 09/23/2022 Goal Status: Active Ulcer/skin breakdown will have a volume reduction of 80% by week 12 Date Initiated: 07/24/2022 Target Resolution  Date: 10/23/2022 Goal Status: Active Ulcer/skin breakdown will heal within 14 weeks Date Initiated: 07/24/2022 Target Resolution Date: 11/06/2022 Goal Status: Active Interventions: Assess  patient/caregiver ability to obtain necessary supplies Assess patient/caregiver ability to perform ulcer/skin care regimen upon admission and as needed Assess ulceration(s) every visit Provide education on ulcer and skin care Treatment Activities: Referred to DME Odies Desa for dressing supplies : 07/24/2022 Skin care regimen initiated : 07/24/2022 Notes: Electronic Signature(s) Signed: 07/24/2022 4:27:51 PM By: Midge Aver MSN RN CNS WTA Entered By: Midge Aver on 07/24/2022 13:27:51 -------------------------------------------------------------------------------- Pain Assessment Details Patient Name: Date of Service: Douglas [name] RD L. 07/24/2022 2:00 PM Medical Record Number: [id number] Patient Account Number: [id number] Date of Birth/Sex: Treating RN: Mar 31, 1946 (77 y.o. [place], [place], [place] ([id number]) [id number] Page 8 of 10 Primary Care Douglas [name] Bayly: [name] Other Clinician: Referring Yerachmiel Spinney: Treating Hayze Gazda/Extender: [name] in Treatment: 0 Active Problems Location of Pain Severity and Description of Pain Patient Has Paino No Site Locations Pain Management and Medication Current Pain Management: Electronic Signature(s) Signed: 07/25/2022 12:55:54 PM By: Midge Aver MSN RN CNS WTA Entered By: Midge Aver on 07/24/2022 11:34:11 -------------------------------------------------------------------------------- Patient/Caregiver Education Details Patient Name: Date of Service: Douglas [name] RD Elbert Ewings 4/11/2024andnbsp2:00 PM Medical Record Number: [id number] Patient Account Number: [id number] Date of Birth/Gender: Treating RN: 1946/01/13 (77 y.o. Roel Cluck Primary Care Physician: [name] Other Clinician: Referring Physician: Treating Physician/Extender: [name] in Treatment: 0 Education Assessment Education Provided To: Patient Education Topics  Provided Welcome T [facility]New Patient Packet: o Handouts: Welcome T [facility] o Methods: Explain/Verbal Responses: State content correctly Wound Debridement: Handouts: Wound Debridement Methods: Explain/Verbal Responses: State content correctly Douglas [name], Douglas [name] ([id number]) [id number] Page 9 of 10 Wound/Skin Impairment: Handouts: Caring for Your Ulcer Methods: Explain/Verbal Responses: State content correctly Electronic Signature(s) Signed: 07/25/2022 12:55:54 PM By: Midge Aver MSN RN CNS WTA Entered By: Midge Aver on 07/24/2022 13:28:26 -------------------------------------------------------------------------------- Wound Assessment Details Patient Name: Date of Service: Douglas [name] RD L. 07/24/2022 2:00 PM Medical Record Number: [id number] Patient Account Number: [id number] Date of Birth/Sex: Treating RN: 1945-10-01 (77 y.o. Roel Cluck Primary Care Skylar Priest: [name] Other Clinician: Referring Akeyla Molden: Treating Violetta Lavalle/Extender: [name] in Treatment: 0 Wound Status Wound Number: 1 Primary Etiology: Diabetic Wound/Ulcer of the Lower Extremity Wound Location: Left, Circumferential T Great oe Wound Status: Open Wounding Event: Shear/Friction Comorbid History: Sleep Apnea, Hypertension, Type II Diabetes, Neuropathy Date Acquired: 07/17/2022 Weeks Of Treatment: 0 Clustered Wound: No Pending Amputation On Presentation Photos Wound Measurements Length: (cm) Width: (cm) Depth: (cm) Area: (cm) Volume: (cm) 6.4 % Reduction in Area: 10.5 % Reduction in Volume: 0.1 52.779 5.278 Wound Description Classification: Grade 1 Exudate Amount: Medium Exudate Type: Serosanguineous Exudate Color: red, brown Foul Odor After Cleansing: No Slough/Fibrino Yes Wound Bed Granulation Amount: Medium (34-66%) Exposed Structure Granulation Quality: Red, Pink Fascia Exposed:  No Necrotic Amount: Small (1-33%) Fat Layer (Subcutaneous Tissue) Exposed: Yes Douglas [name], BIAGINI ([id number]) [id number] Page 10 of 10 Necrotic Quality: Adherent Slough Tendon Exposed: No Muscle Exposed: No Joint Exposed: No Bone Exposed: No Electronic Signature(s) Signed: 07/24/2022 4:24:45 PM By: Midge Aver MSN RN CNS WTA Entered By: Midge Aver on 07/24/2022 13:24:44 -------------------------------------------------------------------------------- Vitals Details Patient Name: Date of Service: Douglas [name] RD L. 07/24/2022 2:00 PM Medical Record Number: [id number] Patient Account Number: [id number] Date of Birth/Sex: Treating RN: 1946/01/16 (77 y.o. Roel Cluck Primary Care Douglas [name] Jallow: [name] Other  Clinician: Referring Antonin Meininger: Treating Kerah Hardebeck/Extender: [name] in Treatment: 0 Vital Signs Time Taken: 14:34 Temperature (F): 98.2 Height (in): 72 Pulse (bpm): 75 Source: Stated Respiratory Rate (breaths/min): 16 Weight (lbs): 253 Blood Pressure (mmHg): 134/78 Source: Stated Reference Range: 80 - 120 mg / dl Body Mass Index (BMI): 34.3 Electronic Signature(s) Signed: 07/25/2022 12:55:54 PM By: Midge Aver MSN RN CNS WTA Entered By: Midge Aver on 07/24/2022 11:36:26

## 2022-07-25 NOTE — Progress Notes (Signed)
LEXUS, BARLETTA ([id number]) [phone] [id number] Page 1 of 4 Visit Report for 07/24/2022 Abuse Risk Screen Details Patient Name: Date of Service: STATON, [name] 07/24/2022 2:00 PM Medical Record Number: [id number] Patient Account Number: [id number] Date of Birth/Sex: Treating RN: [birth year] (77 y.o. Douglas [name] Primary Care Jaykob Minichiello: [name] Other Clinician: Referring Pranay Hilbun: Treating Hector Taft/Extender: [name] in Treatment: 0 Abuse Risk Screen Items Answer Electronic Signature(s) Signed: 07/25/2022 12:55:54 PM By: Midge Aver MSN RN CNS WTA Entered By: Midge Aver on 07/24/2022 14:45:49 -------------------------------------------------------------------------------- Activities of Daily Living Details Patient Name: Date of Service: Douglas [name], [name] 07/24/2022 2:00 PM Medical Record Number: [id number] Patient Account Number: [id number] Date of Birth/Sex: Treating RN: 02-06-1946 (77 y.o. Douglas [name] Primary Care Moksh Loomer: [name] Other Clinician: Referring Alyzae Hawkey: Treating Draedyn Weidinger/Extender: [name] in Treatment: 0 Activities of Daily Living Items Answer Activities of Daily Living (Please select one for each item) Drive Automobile Completely Able T Medications ake Completely Able Use T elephone Completely Able Care for Appearance Completely Able Use T oilet Completely Able Bath / Shower Completely Able Dress Self Completely Able Feed Self Completely Able Walk Completely Able Get In / Out Bed Completely Able Housework Completely Able Prepare Meals Completely Able Handle Money Completely Able Shop for Self Completely Able Electronic Signature(s) Signed: 07/25/2022 12:55:54 PM By: Midge Aver MSN RN CNS WTA Entered By: Midge Aver on 07/24/2022 14:46:03 -------------------------------------------------------------------------------- Education  Screening Details Patient Name: Date of Service: Douglas Lime RD L. 07/24/2022 2:00 PM Medical Record Number: [id number] Patient Account Number: [id number] Date of Birth/Sex: Treating RN: Jun 25, 1945 (77 y.o. Douglas [name] Primary Care Oluwatoyin Banales: [name] Other Clinician: Referring Zeke Aker: Treating Kaycee Mcgaugh/Extender: [name] in Treatment: 0 Learning Preferences/Education Level/Primary Language Learning Preference: Explanation, Demonstration Douglas [name], Douglas [name] ([id number]) [phone] [id number] Page 2 of 4 Highest Education Level: College or Above Preferred Language: English Cognitive Barrier Language Barrier: No Translator Needed: No Memory Deficit: No Emotional Barrier: No Cultural/Religious Beliefs Affecting Medical Care: No Physical Barrier Impaired Vision: No Impaired Hearing: No Decreased Hand dexterity: No Knowledge/Comprehension Knowledge Level: High Comprehension Level: High Ability to understand written instructions: High Ability to understand verbal instructions: High Motivation Anxiety Level: Calm Cooperation: Cooperative Education Importance: Acknowledges Need Interest in Health Problems: Asks Questions Perception: Coherent Willingness to Engage in Self-Management High Activities: Readiness to Engage in Self-Management High Activities: Electronic Signature(s) Signed: 07/25/2022 12:55:54 PM By: Midge Aver MSN RN CNS WTA Entered By: Midge Aver on 07/24/2022 14:46:56 -------------------------------------------------------------------------------- Fall Risk Assessment Details Patient Name: Date of Service: Douglas Lime RD L. 07/24/2022 2:00 PM Medical Record Number: [id number] Patient Account Number: [id number] Date of Birth/Sex: Treating RN: 1945-07-18 (77 y.o. Douglas [name] Primary Care Krystyne Tewksbury: [name] Other Clinician: Referring Gwenneth Whiteman: Treating Kolston Lacount/Extender: [name] in Treatment: 0 Fall Risk Assessment Items Have you had 2 or more falls in the last 12 monthso 0 No Have you had any fall that resulted in injury in the last 12 monthso 0 No FALLS RISK SCREEN History of falling - immediate or within 3 months 0 No Secondary diagnosis (Do you have 2 or more medical diagnoseso) 0 No Ambulatory aid None/bed rest/wheelchair/nurse 0 No Crutches/cane/walker 0 No Furniture 0 No Intravenous therapy Access/Saline/Heparin Lock 0 No Gait/Transferring Normal/ bed rest/ wheelchair 0 No Weak (short steps with or without shuffle, stooped but able to lift head while walking, may seek 0 No support from furniture) Impaired (short steps with  shuffle, may have difficulty arising from chair, head down, impaired 0 No balance) Mental Status Oriented to own ability 0 Yes Electronic Signature(s) Signed: 07/25/2022 12:55:54 PM By: Midge Aver MSN RN CNS WTA Entered By: Midge Aver on 07/24/2022 14:47:18 [name] ([id number]) [id number] Page 3 of 4 -------------------------------------------------------------------------------- Foot Assessment Details Patient Name: Date of Service: [name], [name] 07/24/2022 2:00 PM Medical Record Number: [id number] Patient Account Number: [id number] Date of Birth/Sex: Treating RN: [birth year] (77 y.o. Douglas [name] Primary Care Daimen Shovlin: [name] Other Clinician: Referring Gregory Dowe: Treating Gillermo Poch/Extender: [name] in Treatment: 0 Foot Assessment Items Site Locations + = Sensation present, - = Sensation absent, C = Callus, U = Ulcer R = Redness, W = Warmth, M = Maceration, PU = Pre-ulcerative lesion F = Fissure, S = Swelling, D = Dryness Assessment Right: Left: Other Deformity: No No Prior Foot Ulcer: No No Prior Amputation: No No Charcot Joint: No No Ambulatory Status: Ambulatory Without Help Gait:  Steady Electronic Signature(s) Signed: 07/25/2022 12:55:54 PM By: Midge Aver MSN RN CNS WTA Entered By: Midge Aver on 07/24/2022 14:53:13 -------------------------------------------------------------------------------- Nutrition Risk Screening Details Patient Name: Date of Service: Douglas Lime RD L. 07/24/2022 2:00 PM Medical Record Number: [id number] Patient Account Number: [id number] Date of Birth/Sex: Treating RN: 1945-09-15 (77 y.o. Douglas [name] Primary Care Leanard Dimaio: [name] Other Clinician: Referring Khaya Theissen: Treating Philander Ake/Extender: [name] in Treatment: 0 Height (in): 72 Weight (lbs): 253 Body Mass Index (BMI): 34.3 Nutrition Risk Screening Items Score Screening YOSGAR, WEISENBERG ([id number]) [phone] [id number] Page 4 of 4 NUTRITION RISK SCREEN: I have an illness or condition that made me change the kind and/or amount of food I eat 0 No I eat fewer than two meals per day 0 No I eat few fruits and vegetables, or milk products 0 No I have three or more drinks of beer, liquor or wine almost every day 0 No I have tooth or mouth problems that make it hard for me to eat 0 No I don't always have enough money to buy the food I need 0 No I eat alone most of the time 0 No I take three or more different prescribed or over-the-counter drugs a day 1 Yes Without wanting to, I have lost or gained 10 pounds in the last six months 0 No I am not always physically able to shop, cook and/or feed myself 0 No Nutrition Protocols Good Risk Protocol 0 No interventions needed Moderate Risk Protocol High Risk Proctocol Risk Level: Good Risk Score: 1 Electronic Signature(s) Signed: 07/25/2022 12:55:54 PM By: Midge Aver MSN RN CNS WTA Entered By: Midge Aver on 07/24/2022 14:47:43

## 2022-07-25 NOTE — Progress Notes (Addendum)
KEYMON, SANDAHL ([id number]) [id number] Page 1 of 10 Visit Report for 07/24/2022 Chief Complaint Document Details Patient Name: Date of Service: Douglas [name], DISTASI RD L. 07/24/2022 2:00 PM Medical Record Number: [id number] Patient Account Number: [id number] Date of Birth/Sex: Treating RN: 03-19-1946 (77 y.o. Roel Cluck Primary Care Provider: [name] Other Clinician: Referring Provider: Treating Provider/Extender: [name] in Treatment: 0 Information Obtained from: Patient Chief Complaint Left great toe ulcer Electronic Signature(s) Signed: 07/24/2022 3:16:51 PM By: Allen Derry PA-C Entered By: Allen Derry on 07/24/2022 12:16:51 -------------------------------------------------------------------------------- Debridement Details Patient Name: Date of Service: Douglas [name] RD L. 07/24/2022 2:00 PM Medical Record Number: [id number] Patient Account Number: [id number] Date of Birth/Sex: Treating RN: 07/13/1945 (77 y.o. Roel Cluck Primary Care Provider: [name] Other Clinician: Referring Provider: Treating Provider/Extender: [name] in Treatment: 0 Debridement Performed for Assessment: Wound #1 Left,Circumferential T Great oe Performed By: Physician Allen Derry, PA-C Debridement Type: Debridement Severity of Tissue Pre Debridement: Fat layer exposed Level of Consciousness (Pre-procedure): Awake and Alert Pre-procedure Verification/Time Out Yes - 08:05 Taken: Start Time: 08:05 Pain Control: Lidocaine 4% T opical Solution T Area Debrided (L x W): otal 6.5 (cm) x 11 (cm) = 71.5 (cm) Tissue and other material debrided: Viable, Non-Viable, Slough, Subcutaneous, Biofilm, Slough Level: Skin/Subcutaneous Tissue Debridement Description: Excisional Instrument: Curette Bleeding: Minimum Hemostasis Achieved: Pressure Response to Treatment: Procedure was tolerated well Level of  Consciousness (Post- Awake and Alert procedure): Douglas [name], Douglas [name] ([id number]) [id number] Page 2 of 10 Post Debridement Measurements of Total Wound Length: (cm) 6.5 Width: (cm) 11 Depth: (cm) 0.1 Volume: (cm) 5.616 Character of Wound/Ulcer Post Debridement: Improved Severity of Tissue Post Debridement: Fat layer exposed Post Procedure Diagnosis Same as Pre-procedure Electronic Signature(s) Signed: 07/31/2022 9:58:22 AM By: Allen Derry PA-C Signed: 12/12/2022 2:47:14 PM By: Midge Aver MSN RN CNS WTA Entered By: Allen Derry on 07/31/2022 06:58:22 -------------------------------------------------------------------------------- HPI Details Patient Name: Date of Service: Douglas [name] RD L. 07/24/2022 2:00 PM Medical Record Number: [id number] Patient Account Number: [id number] Date of Birth/Sex: Treating RN: [birth year] (77 y.o. Roel Cluck Primary Care Provider: [name] Other Clinician: Referring Provider: Treating Provider/Extender: [name] in Treatment: 0 History of Present Illness HPI Description: 07-24-2022 upon evaluation today patient presents for initial inspection here in our clinic concerning issues that has been having with a wound on his left great toe. This is actually a circumferential issue which was precipitated by the fact that he has neuropathy. Subsequently he was trying to do what he could do to help alleviate some of his discomfort and what he noted was that pressure to the toe actually helped relieve some of the neuropathic pain. So we decided he was going to wrap his toe with tape and it felt good so he decided to leave it through the night. Unfortunately this was too much compression that actually cut off the blood flow to the toe and because a degloving injury to the toe pretty much from the base to the tip of the great toe. Unfortunately this has not completely healed yet but fortunately I do  not think it is likely going to end up is something that he will lose but nonetheless this is still questionable at this point. The patient states that he knows he should have done this and is hopefully learned his lesson he tells me that he has not been more careful and not do this type of thing in the future. Patient  does have a history of diabetes mellitus type 2, diabetic neuropathy related to the diabetes type 2, hypertension, and the neuropathy in his case is fairly severe. His most recent hemoglobin A1c was 6.5 that was on March 05, 2022 he does take a 325 mg aspirin. Electronic Signature(s) Signed: 07/24/2022 4:30:45 PM By: Allen Derry PA-C Entered By: Allen Derry on 07/24/2022 13:30:45 -------------------------------------------------------------------------------- Physical Exam Details Patient Name: Date of Service: Douglas [name] RD L. 07/24/2022 2:00 PM [name] ([id number]) [id number] Page 3 of 10 Medical Record Number: [id number] Patient Account Number: [id number] Date of Birth/Sex: Treating RN: 31-May-1945 (77 y.o. Roel Cluck Primary Care Provider: [name] Other Clinician: Referring Provider: Treating Provider/Extender: [name] in Treatment: 0 Constitutional sitting or standing blood pressure is within target range for patient.. pulse regular and within target range for patient.Marland Kitchen respirations regular, non-labored and within target range for patient.Marland Kitchen temperature within target range for patient.. Obese and well-hydrated in no acute distress. Eyes conjunctiva clear no eyelid edema noted. pupils equal round and reactive to light and accommodation. Ears, Nose, Mouth, and Throat no gross abnormality of ear auricles or external auditory canals. normal hearing noted during conversation. mucus membranes moist. Respiratory normal breathing without difficulty. Cardiovascular 2+ dorsalis  pedis/posterior tibialis pulses. no clubbing, cyanosis, significant edema, <3 sec cap refill. Musculoskeletal normal gait and posture. no significant deformity or arthritic changes, no loss or range of motion, no clubbing. Psychiatric this patient is able to make decisions and demonstrates good insight into disease process. Alert and Oriented x 3. pleasant and cooperative. Notes Upon inspection patient's wound bed actually showed signs of good granulation epithelization at this point. Fortunately there does not appear to be any signs of active infection at this time which is great news he does have a degloving injury to the great toe on his left foot which actually seems to be doing quite well he started to see some epithelial growth I think he has good blood flow at this point and I am hopeful that he will not end up losing anything as far as this toe is concerned. I think his chances of proceeding amputation are lower than what I would have thought considering the type of injury. Electronic Signature(s) Signed: 07/24/2022 4:31:30 PM By: Allen Derry PA-C Entered By: Allen Derry on 07/24/2022 13:31:30 -------------------------------------------------------------------------------- Physician Orders Details Patient Name: Date of Service: Douglas [name] RD L. 07/24/2022 2:00 PM Medical Record Number: [id number] Patient Account Number: [id number] Date of Birth/Sex: Treating RN: 1945-04-24 (77 y.o. Roel Cluck Primary Care Provider: [name] Other Clinician: Referring Provider: Treating Provider/Extender: [name] in Treatment: 0 Verbal / Phone Orders: No Diagnosis Coding ICD-10 Coding Code Description E11.621 Type 2 diabetes mellitus with foot ulcer L97.522 Non-pressure chronic ulcer of other part of left foot with fat layer exposed E11.40 Type 2 diabetes mellitus with diabetic neuropathy, unspecified I10 Essential (primary) hypertension Follow-up  Appointments Return Appointment in 1 week. STEPFAN, BRINING ([id number]) [id number] Page 4 of 10 Bathing/ [employer] wounds with antibacterial soap and water. Wound Treatment Wound #1 - T Great oe Wound Laterality: Left, Circumferential Cleanser: Byram Ancillary Kit - 15 Day Supply (DME) (Generic) 1 x Per Day/30 Days Discharge Instructions: Use supplies as instructed; Kit contains: (15) Saline Bullets; (15) 3x3 Gauze; 15 pr Gloves Cleanser: Soap and Water 1 x Per Day/30 Days Discharge Instructions: Gently cleanse wound with antibacterial soap, rinse and pat dry prior to dressing wounds Prim Dressing: Xeroform  4x4-HBD (in/in) (DME) (Generic) 1 x Per Day/30 Days ary Discharge Instructions: Apply Xeroform 4x4-HBD (in/in) as directed Secondary Dressing: ABD Pad 5x9 (in/in) (DME) (Generic) 1 x Per Day/30 Days Discharge Instructions: Cover with ABD pad Secondary Dressing: Gauze (DME) (Generic) 1 x Per Day/30 Days Discharge Instructions: As directed: dry, moistened with saline or moistened with Dakins Solution Secured With: Medipore T - 40M Medipore H Soft Cloth Surgical T ape ape, 2x2 (in/yd) (DME) (Generic) 1 x Per Day/30 Days Electronic Signature(s) Signed: 07/24/2022 10:26:23 PM By: Allen Derry PA-C Signed: 07/25/2022 12:55:54 PM By: Midge Aver MSN RN CNS WTA Entered By: Midge Aver on 07/24/2022 12:29:56 -------------------------------------------------------------------------------- Problem List Details Patient Name: Date of Service: Douglas [name] RD L. 07/24/2022 2:00 PM Medical Record Number: [id number] Patient Account Number: [id number] Date of Birth/Sex: Treating RN: 10/24/1945 (77 y.o. Roel Cluck Primary Care Provider: [name] Other Clinician: Referring Provider: Treating Provider/Extender: [name] in Treatment: 0 Active Problems ICD-10 Encounter Code Description Active Date  MDM Diagnosis E11.621 Type 2 diabetes mellitus with foot ulcer 07/24/2022 No Yes L97.522 Non-pressure chronic ulcer of other part of left foot with fat layer exposed 07/24/2022 No Yes E11.40 Type 2 diabetes mellitus with diabetic neuropathy, unspecified 07/24/2022 No Yes I10 Essential (primary) hypertension 07/24/2022 No Yes Inactive Problems DMARCO, FILTZ ([id number]) [id number] Page 5 of 10 Resolved Problems Electronic Signature(s) Signed: 07/24/2022 4:28:45 PM By: Midge Aver MSN RN CNS WTA Signed: 07/24/2022 10:26:23 PM By: Allen Derry PA-C Previous Signature: 07/24/2022 3:16:30 PM Version By: Allen Derry PA-C Entered By: Midge Aver on 07/24/2022 13:28:44 -------------------------------------------------------------------------------- Progress Note Details Patient Name: Date of Service: Douglas [name] RD L. 07/24/2022 2:00 PM Medical Record Number: [id number] Patient Account Number: [id number] Date of Birth/Sex: Treating RN: 1945/06/29 (77 y.o. Roel Cluck Primary Care Provider: [name] Other Clinician: Referring Provider: Treating Provider/Extender: [name] in Treatment: 0 Subjective Chief Complaint Information obtained from Patient Left great toe ulcer History of Present Illness (HPI) 07-24-2022 upon evaluation today patient presents for initial inspection here in our clinic concerning issues that has been having with a wound on his left great toe. This is actually a circumferential issue which was precipitated by the fact that he has neuropathy. Subsequently he was trying to do what he could do to help alleviate some of his discomfort and what he noted was that pressure to the toe actually helped relieve some of the neuropathic pain. So we decided he was going to wrap his toe with tape and it felt good so he decided to leave it through the night. Unfortunately this was too much compression that actually  cut off the blood flow to the toe and because a degloving injury to the toe pretty much from the base to the tip of the great toe. Unfortunately this has not completely healed yet but fortunately I do not think it is likely going to end up is something that he will lose but nonetheless this is still questionable at this point. The patient states that he knows he should have done this and is hopefully learned his lesson he tells me that he has not been more careful and not do this type of thing in the future. Patient does have a history of diabetes mellitus type 2, diabetic neuropathy related to the diabetes type 2, hypertension, and the neuropathy in his case is fairly severe. His most recent hemoglobin A1c was 6.5 that was on March 05, 2022 he does take  a 325 mg aspirin. Patient History Information obtained from Patient. Allergies esomeprazole (Severity: Moderate), atenolol (Severity: Moderate), enalapril, gabapentin, HCTZ (Severity: Moderate) Social History Never smoker, Marital Status - Married, Alcohol Use - Never, Drug Use - No History, Caffeine Use - Rarely. Medical History Respiratory Patient has history of Sleep Apnea Cardiovascular Patient has history of Hypertension Endocrine Patient has history of Type II Diabetes Neurologic Patient has history of Neuropathy Denies history of Dementia, Quadriplegia, Paraplegia, Seizure Disorder Psychiatric Denies history of Anorexia/bulimia, Confinement Anxiety Patient is treated with Insulin, Oral Agents. Blood sugar is not tested. Review of Systems (ROS) Constitutional Symptoms (General Health) Denies complaints or symptoms of Fatigue, Fever, Chills, Marked Weight Change. Eyes Denies complaints or symptoms of Dry Eyes, Vision Changes, Glasses / Contacts. Ear/Nose/Mouth/Throat Denies complaints or symptoms of Difficult clearing ears, Sinusitis. MEMPHYS, HENSARLING ([id number]) [id number] Page 6 of  10 Hematologic/Lymphatic Denies complaints or symptoms of Bleeding / Clotting Disorders, Human Immunodeficiency Virus. Respiratory Denies complaints or symptoms of Chronic or frequent coughs, Shortness of Breath. Endocrine Complains or has symptoms of Thyroid disease. Genitourinary Denies complaints or symptoms of Kidney failure/ Dialysis, Incontinence/dribbling. Immunological Denies complaints or symptoms of Hives, Itching. Integumentary (Skin) Denies complaints or symptoms of Wounds, Bleeding or bruising tendency, Breakdown, Swelling. Musculoskeletal Denies complaints or symptoms of Muscle Pain, Muscle Weakness. Neurologic Denies complaints or symptoms of Numbness/parasthesias, Focal/Weakness. Psychiatric Denies complaints or symptoms of Anxiety, Claustrophobia. Objective Constitutional sitting or standing blood pressure is within target range for patient.. pulse regular and within target range for patient.Marland Kitchen respirations regular, non-labored and within target range for patient.Marland Kitchen temperature within target range for patient.. Obese and well-hydrated in no acute distress. Vitals Time Taken: 2:34 PM, Height: 72 in, Source: Stated, Weight: 253 lbs, Source: Stated, BMI: 34.3, Temperature: 98.2 F, Pulse: 75 bpm, Respiratory Rate: 16 breaths/min, Blood Pressure: 134/78 mmHg. Eyes conjunctiva clear no eyelid edema noted. pupils equal round and reactive to light and accommodation. Ears, Nose, Mouth, and Throat no gross abnormality of ear auricles or external auditory canals. normal hearing noted during conversation. mucus membranes moist. Respiratory normal breathing without difficulty. Cardiovascular 2+ dorsalis pedis/posterior tibialis pulses. no clubbing, cyanosis, significant edema, Musculoskeletal normal gait and posture. no significant deformity or arthritic changes, no loss or range of motion, no clubbing. Psychiatric this patient is able to make decisions and demonstrates good  insight into disease process. Alert and Oriented x 3. pleasant and cooperative. General Notes: Upon inspection patient's wound bed actually showed signs of good granulation epithelization at this point. Fortunately there does not appear to be any signs of active infection at this time which is great news he does have a degloving injury to the great toe on his left foot which actually seems to be doing quite well he started to see some epithelial growth I think he has good blood flow at this point and I am hopeful that he will not end up losing anything as far as this toe is concerned. I think his chances of proceeding amputation are lower than what I would have thought considering the type of injury. Integumentary (Hair, Skin) Wound #1 status is Open. Original cause of wound was Shear/Friction. The date acquired was: 07/17/2022. The wound is located on the Left,Circumferential Toe Great. The wound measures 6.4cm length x 10.5cm width x 0.1cm depth; 52.779cm^2 area and 5.278cm^3 volume. There is Fat Layer (Subcutaneous Tissue) exposed. There is a medium amount of serosanguineous drainage noted. There is medium (34-66%) red, pink granulation within the wound bed.  There is a small (1-33%) amount of necrotic tissue within the wound bed including Adherent Slough. Assessment Active Problems ICD-10 Type 2 diabetes mellitus with foot ulcer Non-pressure chronic ulcer of other part of left foot with fat layer exposed Type 2 diabetes mellitus with diabetic neuropathy, unspecified Essential (primary) hypertension Procedures Wound #1 Pre-procedure diagnosis of Wound #1 is a Diabetic Wound/Ulcer of the Lower Extremity located on the Left,Circumferential T Great .Severity of Tissue Pre oe Debridement is: Fat layer exposed. There was a Excisional Skin/Subcutaneous Tissue Debridement with a total area of 71.5 sq cm performed by Clyde Canterbury, [place] ([id number]) [phone].pdf  Page 7 of 10 PA-C. With the following instrument(s): Curette to remove Viable and Non-Viable tissue/material. Material removed includes Subcutaneous Tissue, Slough, and Biofilm after achieving pain control using Lidocaine 4% Topical Solution. A time out was conducted at 08:05, prior to the start of the procedure. A Minimum amount of bleeding was controlled with Pressure. The procedure was tolerated well. Post Debridement Measurements: 6.5cm length x 11cm width x 0.1cm depth; 5.616cm^3 volume. Character of Wound/Ulcer Post Debridement is improved. Severity of Tissue Post Debridement is: Fat layer exposed. Post procedure Diagnosis Wound #1: Same as Pre-Procedure Plan Follow-up Appointments: Return Appointment in 1 week. Bathing/ Shower/ Hygiene: Wash wounds with antibacterial soap and water. WOUND #1: - T Great Wound Laterality: Left, Circumferential oe Cleanser: [employer]ram Ancillary Kit - 15 Day Supply (DME) (Generic) 1 x Per Day/30 Days Discharge Instructions: Use supplies as instructed; Kit contains: (15) Saline Bullets; (15) 3x3 Gauze; 15 pr Gloves Cleanser: Soap and Water 1 x Per Day/30 Days Discharge Instructions: Gently cleanse wound with antibacterial soap, rinse and pat dry prior to dressing wounds Prim Dressing: Xeroform 4x4-HBD (in/in) (DME) (Generic) 1 x Per Day/30 Days ary Discharge Instructions: Apply Xeroform 4x4-HBD (in/in) as directed Secondary Dressing: ABD Pad 5x9 (in/in) (DME) (Generic) 1 x Per Day/30 Days Discharge Instructions: Cover with ABD pad Secondary Dressing: Gauze (DME) (Generic) 1 x Per Day/30 Days Discharge Instructions: As directed: dry, moistened with saline or moistened with Dakins Solution Secured With: Medipore T - 88M Medipore H Soft Cloth Surgical T ape ape, 2x2 (in/yd) (DME) (Generic) 1 x Per Day/30 Days 1. I would recommend that we actually go ahead and initiate a continuation of treatment specifically with the Xeroform gauze dressing to the wound. Will  wrap a little bit of gauze around lightly secured with tape lightly and then subsequently do a duckbill ABD pad over the end of the foot secured with tape against the foot to prevent anything from being too tight on the end of his toe. 2. I am good recommend as well that he should continue to change this daily I think that is probably going to be the best option here and the patient voiced understanding he is in agreement with that plan. We will see patient back for reevaluation in 1 week here in the clinic. If anything worsens or changes patient will contact our office for additional recommendations. Electronic Signature(s) Signed: 07/31/2022 9:58:45 AM By: Allen Derry PA-C Previous Signature: 07/24/2022 4:32:03 PM Version By: Allen Derry PA-C Entered By: Allen Derry on 07/31/2022 06:58:45 -------------------------------------------------------------------------------- ROS/PFSH Details Patient Name: Date of Service: Douglas [name] RD L. 07/24/2022 2:00 PM Medical Record Number: [id number] Patient Account Number: [id number] Date of Birth/Sex: Treating RN: 01/15/1946 (77 y.o. Roel Cluck Primary Care Provider: [name] Other Clinician: Referring Provider: Treating Provider/Extender: [name] in Treatment: 0 Information Obtained From Patient Constitutional Symptoms (  General Health) Complaints and Symptoms: Negative for: Fatigue; Fever; Chills; Marked Weight Change Eyes Complaints and Symptoms: ADRIN, BOURDON ([id number]) [id number] Page 8 of 10 Negative for: Dry Eyes; Vision Changes; Glasses / Contacts Ear/Nose/Mouth/Throat Complaints and Symptoms: Negative for: Difficult clearing ears; Sinusitis Hematologic/Lymphatic Complaints and Symptoms: Negative for: Bleeding / Clotting Disorders; Human Immunodeficiency Virus Respiratory Complaints and Symptoms: Negative for: Chronic or frequent coughs; Shortness of  Breath Medical History: Positive for: Sleep Apnea Endocrine Complaints and Symptoms: Positive for: Thyroid disease Medical History: Positive for: Type II Diabetes Treated with: Insulin, Oral agents Blood sugar tested every day: No Genitourinary Complaints and Symptoms: Negative for: Kidney failure/ Dialysis; Incontinence/dribbling Immunological Complaints and Symptoms: Negative for: Hives; Itching Integumentary (Skin) Complaints and Symptoms: Negative for: Wounds; Bleeding or bruising tendency; Breakdown; Swelling Musculoskeletal Complaints and Symptoms: Negative for: Muscle Pain; Muscle Weakness Neurologic Complaints and Symptoms: Negative for: Numbness/parasthesias; Focal/Weakness Medical History: Positive for: Neuropathy Negative for: Dementia; Quadriplegia; Paraplegia; Seizure Disorder Psychiatric Complaints and Symptoms: Negative for: Anxiety; Claustrophobia Medical History: Negative for: Anorexia/bulimia; Confinement Anxiety Cardiovascular Medical History: Positive for: Hypertension Oncologic Immunizations Pneumococcal Vaccine: Received Pneumococcal Vaccination: Yes Received Pneumococcal Vaccination On or After [address], [place] ([id number]) [id number] Page 9 of 10 Implantable Devices None Family and Social History Never smoker; Marital Status - Married; Alcohol Use: Never; Drug Use: No History; Caffeine Use: Rarely Electronic Signature(s) Signed: 07/24/2022 10:26:23 PM By: Allen Derry PA-C Signed: 07/25/2022 12:55:54 PM By: Midge Aver MSN RN CNS WTA Entered By: Midge Aver on 07/24/2022 11:45:40 -------------------------------------------------------------------------------- SuperBill Details Patient Name: Date of Service: Douglas [name] RD L. 07/24/2022 Medical Record Number: [id number] Patient Account Number: [id number] Date of Birth/Sex: Treating RN: Mar 18, 1946 (77 y.o. Roel Cluck Primary Care  Provider: [name] Other Clinician: Referring Provider: Treating Provider/Extender: [name] in Treatment: 0 Diagnosis Coding ICD-10 Codes Code Description [phone] Type 2 diabetes mellitus with foot ulcer L97.522 Non-pressure chronic ulcer of other part of left foot with fat layer exposed E11.40 Type 2 diabetes mellitus with diabetic neuropathy, unspecified I10 Essential (primary) hypertension Facility Procedures : CPT4 Code: [id number] Description: 99213 - WOUND CARE VISIT-LEV 3 EST PT Modifier: Quantity: 1 : CPT4 Code: [id number] Description: 11042 - DEB SUBQ TISSUE 20 SQ CM/< ICD-10 Diagnosis Description L97.522 Non-pressure chronic ulcer of other part of left foot with fat layer exposed Modifier: Quantity: 1 : CPT4 Code: [id number] Description: 11045 - DEB SUBQ TISS EA ADDL 20CM ICD-10 Diagnosis Description L97.522 Non-pressure chronic ulcer of other part of left foot with fat layer exposed Modifier: Quantity: 3 Physician Procedures : CPT4 Code Description Modifier [id number] WC PHYS LEVEL 3 NEW PT 25 ICD-10 Diagnosis Description E11.621 Type 2 diabetes mellitus with foot ulcer L97.522 Non-pressure chronic ulcer of other part of left foot with fat layer exposed E11.40 Type 2 diabetes  mellitus with diabetic neuropathy, unspecified I10 Essential (primary) hypertension Quantity: 1 : [id number] 11042 - WC PHYS SUBQ TISS 20 SQ CM ICD-10 Diagnosis Description L97.522 Non-pressure chronic ulcer of other part of left foot with fat layer exposed DEANDRA, ABEGGLEN L ([id number]) [id number] 11045 - WC PHYS SUBQ  TISS EA ADDL 20 CM ICD-10 Diagnosis Description L97.522 Non-pressure chronic ulcer of other part of left foot with fat layer exposed Quantity: 1 [id number].pdf Page 10 of 10 3 Electronic Signature(s) Signed: 07/31/2022 10:00:27 AM By: Allen Derry PA-C Previous Signature: 07/30/2022 8:27:00 AM Version By: Midge Aver MSN RN CNS  WTA Previous Signature: 07/24/2022 4:34:20 PM Version By: Allen Derry  PA-C Entered By: Allen Derry on 07/31/2022 07:00:26

## 2022-07-31 ENCOUNTER — Encounter: Payer: PPO | Admitting: Physician Assistant

## 2022-07-31 DIAGNOSIS — E11621 Type 2 diabetes mellitus with foot ulcer: Secondary | ICD-10-CM | POA: Diagnosis not present

## 2022-07-31 DIAGNOSIS — L97522 Non-pressure chronic ulcer of other part of left foot with fat layer exposed: Secondary | ICD-10-CM | POA: Diagnosis not present

## 2022-07-31 NOTE — Progress Notes (Addendum)
NYHEIM, SEUFERT ([id number]) [id number] Page 1 of 6 Visit Report for 07/31/2022 Chief Complaint Document Details Patient Name: Date of Service: Douglas [name], [name] 07/31/2022 7:45 A M Medical Record Number: [id number] Patient Account Number: [id number] Date of Birth/Sex: Treating RN: [birth year] (77 y.o. Douglas [name] Primary Care Provider: Marikay [name] Other Clinician: Referring Provider: Treating Provider/Extender: Douglas [name] in Treatment: 1 Information Obtained from: Patient Chief Complaint Left great toe ulcer Electronic Signature(s) Signed: 07/31/2022 8:02:01 AM By: Douglas Derry PA-C Entered By: Douglas [name] on 07/31/2022 08:02:01 -------------------------------------------------------------------------------- Debridement Details Patient Name: Date of Service: Douglas Lime RD L. 07/31/2022 7:45 A M Medical Record Number: [id number] Patient Account Number: [id number] Date of Birth/Sex: Treating RN: 1945-10-20 (77 y.o. Douglas [name] Primary Care Provider: Marikay [name] Other Clinician: Referring Provider: Treating Provider/Extender: Douglas [name] in Treatment: 1 Debridement Performed for Assessment: Wound #1 Left,Circumferential T Great oe Performed By: Physician Douglas Derry, PA-C Debridement Type: Debridement Severity of Tissue Pre Debridement: Fat layer exposed Level of Consciousness (Pre-procedure): Awake and Alert Pre-procedure Verification/Time Out Yes - 08:05 Taken: Start Time: 08:05 T Area Debrided (L x W): otal 6.5 (cm) x 11 (cm) = 71.5 (cm) Tissue and other material debrided: Viable, Non-Viable, Slough, Subcutaneous, Biofilm, Slough Level: Skin/Subcutaneous Tissue Debridement Description: Excisional Instrument: Curette Bleeding: Minimum Hemostasis Achieved: Pressure Response to Treatment: Procedure was tolerated well Level of Consciousness (Post- Awake and  Alert procedure): Post Debridement Measurements of Total Wound Length: (cm) 6.5 Width: (cm) 11 Depth: (cm) 0.2 Volume: (cm) 11.231 Character of Wound/Ulcer Post Debridement: Improved Severity of Tissue Post Debridement: Fat layer exposed Post Procedure Diagnosis Same as Pre-procedure Electronic Signature(s) Signed: 07/31/2022 4:51:37 PM By: Douglas Derry PA-C Signed: 07/31/2022 4:52:52 PM By: Douglas [name] Entered By: Douglas [name] on 07/31/2022 16:51:37 Douglas Bienenstock ([id number]) [id number] Page 2 of 6 -------------------------------------------------------------------------------- HPI Details Patient Name: Date of Service: Douglas [name], [name] 07/31/2022 7:45 A M Medical Record Number: [id number] Patient Account Number: [id number] Date of Birth/Sex: Treating RN: 10/06/1945 (77 y.o. Douglas [name] Primary Care Provider: Marikay [name] Other Clinician: Referring Provider: Treating Provider/Extender: Douglas [name] in Treatment: 1 History of Present Illness HPI Description: 07-24-2022 upon evaluation today patient presents for initial inspection here in our clinic concerning issues that has been having with a wound on his left great toe. This is actually a circumferential issue which was precipitated by the fact that he has neuropathy. Subsequently he was trying to do what he could do to help alleviate some of his discomfort and what he noted was that pressure to the toe actually helped relieve some of the neuropathic pain. So we decided he was going to wrap his toe with tape and it felt good so he decided to leave it through the night. Unfortunately this was too much compression that actually cut off the blood flow to the toe and because a degloving injury to the toe pretty much from the base to the tip of the great toe. Unfortunately this has not completely healed yet but fortunately I do not think it is likely going  to end up is something that he will lose but nonetheless this is still questionable at this point. The patient states that he knows he should have done this and is hopefully learned his lesson he tells me that he has not been more careful and not do this type of thing in the future. Patient does have a history  of diabetes mellitus type 2, diabetic neuropathy related to the diabetes type 2, hypertension, and the neuropathy in his case is fairly severe. His most recent hemoglobin A1c was 6.5 that was on March 05, 2022 he does take a 325 mg aspirin. 07-31-2022 upon evaluation today patient appears to be doing well currently in regard to his wound. He has been tolerating the dressing changes without complication and in general I feel like that he is actually making some really good progress here but at the same time draining quite significantly. I think we need to do something to improve the drainage aspect here he is in agreement with that plan. Electronic Signature(s) Signed: 07/31/2022 8:20:06 AM By: Douglas Derry PA-C Entered By: Douglas [name] on 07/31/2022 08:20:06 -------------------------------------------------------------------------------- Physical Exam Details Patient Name: Date of Service: Douglas Lime RD L. 07/31/2022 7:45 A M Medical Record Number: [id number] Patient Account Number: [id number] Date of Birth/Sex: Treating RN: [birth year] (77 y.o. Douglas [name] Primary Care Provider: Marikay [name] Other Clinician: Referring Provider: Treating Provider/Extender: Douglas [name] in Treatment: 1 Constitutional Well-nourished and well-hydrated in no acute distress. Respiratory normal breathing without difficulty. Psychiatric this patient is Douglas [name] to make decisions and demonstrates good insight into disease process. Alert and Oriented x 3. pleasant and cooperative. Notes Upon inspection patient's wound bed actually showed signs of good granulation epithelization  at this point. Fortunately there does not appear to be any signs of active infection locally nor systemically which is great news and overall I am extremely pleased with where we stand at this point. I do think however he is draining quite a bit to continue just with the Xeroform I think we need to make a switch on this. Electronic Signature(s) Signed: 07/31/2022 8:20:33 AM By: Douglas Derry PA-C Entered By: Douglas [name] on 07/31/2022 08:20:32 -------------------------------------------------------------------------------- Physician Orders Details Patient Name: Date of Service: Douglas Lime RD L. 07/31/2022 7:45 A M Medical Record Number: [id number] Patient Account Number: [id number] Date of Birth/Sex: Treating RN: 13-Jan-1946 (77 y.o. Douglas [name] Primary Care Provider: Marikay [name] Other Clinician: Referring Provider: Treating Provider/Extender: Douglas [name] in Treatment: 1 Verbal / Phone Orders: No Douglas [name], Douglas [name] ([id number]) [id number] Page 3 of 6 Diagnosis Coding ICD-10 Coding Code Description E11.621 Type 2 diabetes mellitus with foot ulcer L97.522 Non-pressure chronic ulcer of other part of left foot with fat layer exposed E11.40 Type 2 diabetes mellitus with diabetic neuropathy, unspecified I10 Essential (primary) hypertension Follow-up Appointments Return Appointment in 1 week. Bathing/ [employer] wounds with antibacterial soap and water. Wound Treatment Wound #1 - T Great oe Wound Laterality: Left, Circumferential Cleanser: Byram Ancillary Kit - 15 Day Supply (Generic) 1 x Per Day/30 Days Discharge Instructions: Use supplies as instructed; Kit contains: (15) Saline Bullets; (15) 3x3 Gauze; 15 pr Gloves Cleanser: Soap and Water 1 x Per Day/30 Days Discharge Instructions: Gently cleanse wound with antibacterial soap, rinse and pat dry prior to dressing wounds Prim Dressing: Silvercel 4 1/4x 4 1/4  (in/in) 1 x Per Day/30 Days ary Discharge Instructions: Apply Silvercel 4 1/4x 4 1/4 (in/in) as instructed Secondary Dressing: ABD Pad 5x9 (in/in) (Generic) 1 x Per Day/30 Days Discharge Instructions: Cover with ABD pad Secondary Dressing: Gauze (Generic) 1 x Per Day/30 Days Discharge Instructions: As directed: dry, moistened with saline or moistened with Dakins Solution Secured With: Medipore T - 46M Medipore H Soft Cloth Surgical T ape ape, 2x2 (in/yd) (Generic) 1 x Per Day/30 Days Electronic Signature(s) Signed: 07/31/2022 4:52:52  PM By: Douglas [name] Signed: 08/01/2022 1:44:50 PM By: Douglas Derry PA-C Entered By: Douglas Aver on 07/31/2022 08:14:49 -------------------------------------------------------------------------------- Problem List Details Patient Name: Date of Service: Douglas Lime RD L. 07/31/2022 7:45 A M Medical Record Number: [id number] Patient Account Number: [id number] Date of Birth/Sex: Treating RN: 12-Oct-1945 (77 y.o. Douglas [name] Primary Care Provider: Marikay [name] Other Clinician: Referring Provider: Treating Provider/Extender: Douglas [name] in Treatment: 1 Active Problems ICD-10 Encounter Code Description Active Date MDM Diagnosis E11.621 Type 2 diabetes mellitus with foot ulcer 07/24/2022 No Yes L97.522 Non-pressure chronic ulcer of other part of left foot with fat layer exposed 07/24/2022 No Yes E11.40 Type 2 diabetes mellitus with diabetic neuropathy, unspecified 07/24/2022 No Yes I10 Essential (primary) hypertension 07/24/2022 No Yes Douglas [name], Douglas [name] ([id number]) [id number] Page 4 of 6 Inactive Problems Resolved Problems Electronic Signature(s) Signed: 07/31/2022 8:01:58 AM By: Douglas Derry PA-C Entered By: Douglas [name] on 07/31/2022 08:01:58 -------------------------------------------------------------------------------- Progress Note Details Patient Name: Date of  Service: Douglas Lime RD L. 07/31/2022 7:45 A M Medical Record Number: [id number] Patient Account Number: [id number] Date of Birth/Sex: Treating RN: 15-Apr-1945 (77 y.o. Douglas [name] Primary Care Provider: Marikay [name] Other Clinician: Referring Provider: Treating Provider/Extender: Douglas [name] in Treatment: 1 Subjective Chief Complaint Information obtained from Patient Left great toe ulcer History of Present Illness (HPI) 07-24-2022 upon evaluation today patient presents for initial inspection here in our clinic concerning issues that has been having with a wound on his left great toe. This is actually a circumferential issue which was precipitated by the fact that he has neuropathy. Subsequently he was trying to do what he could do to help alleviate some of his discomfort and what he noted was that pressure to the toe actually helped relieve some of the neuropathic pain. So we decided he was going to wrap his toe with tape and it felt good so he decided to leave it through the night. Unfortunately this was too much compression that actually cut off the blood flow to the toe and because a degloving injury to the toe pretty much from the base to the tip of the great toe. Unfortunately this has not completely healed yet but fortunately I do not think it is likely going to end up is something that he will lose but nonetheless this is still questionable at this point. The patient states that he knows he should have done this and is hopefully learned his lesson he tells me that he has not been more careful and not do this type of thing in the future. Patient does have a history of diabetes mellitus type 2, diabetic neuropathy related to the diabetes type 2, hypertension, and the neuropathy in his case is fairly severe. His most recent hemoglobin A1c was 6.5 that was on March 05, 2022 he does take a 325 mg aspirin. 07-31-2022 upon evaluation today patient appears to  be doing well currently in regard to his wound. He has been tolerating the dressing changes without complication and in general I feel like that he is actually making some really good progress here but at the same time draining quite significantly. I think we need to do something to improve the drainage aspect here he is in agreement with that plan. Objective Constitutional Well-nourished and well-hydrated in no acute distress. Vitals Time Taken: 7:45 AM, Height: 72 in, Weight: 253 lbs, BMI: 34.3, Temperature: 98.1 F, Pulse: 80 bpm, Respiratory Rate: 16 breaths/min, Blood Pressure:  144/81 mmHg. Respiratory normal breathing without difficulty. Psychiatric this patient is Douglas [name] to make decisions and demonstrates good insight into disease process. Alert and Oriented x 3. pleasant and cooperative. General Notes: Upon inspection patient's wound bed actually showed signs of good granulation epithelization at this point. Fortunately there does not appear to be any signs of active infection locally nor systemically which is great news and overall I am extremely pleased with where we stand at this point. I do think however he is draining quite a bit to continue just with the Xeroform I think we need to make a switch on this. Integumentary (Hair, Skin) Wound #1 status is Open. Original cause of wound was Shear/Friction. The date acquired was: 07/17/2022. The wound has been in treatment 1 weeks. The wound is located on the Left,Circumferential T Great. The wound measures 6.5cm length x 11cm width x 0.1cm depth; 56.156cm^2 area and 5.616cm^3 volume. oe There is Fat Layer (Subcutaneous Tissue) exposed. There is a medium amount of serosanguineous drainage noted. There is medium (34-66%) red, pink granulation within the wound bed. There is a small (1-33%) amount of necrotic tissue within the wound bed including Adherent Slough. Assessment Douglas [name], Douglas [name] [id number]  Page 5 of 6 Active Problems ICD-10 Type 2 diabetes mellitus with foot ulcer Non-pressure chronic ulcer of other part of left foot with fat layer exposed Type 2 diabetes mellitus with diabetic neuropathy, unspecified Essential (primary) hypertension Procedures Wound #1 Pre-procedure diagnosis of Wound #1 is a Diabetic Wound/Ulcer of the Lower Extremity located on the Left,Circumferential T Great .Severity of Tissue Pre oe Debridement is: Fat layer exposed. There was a Excisional Skin/Subcutaneous Tissue Debridement with a total area of 71.5 sq cm performed by Douglas Derry, PA-C. With the following instrument(s): Curette to remove Viable and Non-Viable tissue/material. Material removed includes Subcutaneous Tissue, Slough, and Biofilm. A time out was conducted at 08:05, prior to the start of the procedure. A Minimum amount of bleeding was controlled with Pressure. The procedure was tolerated well. Post Debridement Measurements: 6.5cm length x 11cm width x 0.2cm depth; 11.231cm^3 volume. Character of Wound/Ulcer Post Debridement is improved. Severity of Tissue Post Debridement is: Fat layer exposed. Post procedure Diagnosis Wound #1: Same as Pre-Procedure Plan Follow-up Appointments: Return Appointment in 1 week. Bathing/ Shower/ Hygiene: Wash wounds with antibacterial soap and water. WOUND #1: - T Great Wound Laterality: Left, Circumferential oe Cleanser: Byram Ancillary Kit - 15 Day Supply (Generic) 1 x Per Day/30 Days Discharge Instructions: Use supplies as instructed; Kit contains: (15) Saline Bullets; (15) 3x3 Gauze; 15 pr Gloves Cleanser: Soap and Water 1 x Per Day/30 Days Discharge Instructions: Gently cleanse wound with antibacterial soap, rinse and pat dry prior to dressing wounds Prim Dressing: Silvercel 4 1/4x 4 1/4 (in/in) 1 x Per Day/30 Days ary Discharge Instructions: Apply Silvercel 4 1/4x 4 1/4 (in/in) as instructed Secondary Dressing: ABD Pad 5x9 (in/in) (Generic) 1 x Per  Day/30 Days Discharge Instructions: Cover with ABD pad Secondary Dressing: Gauze (Generic) 1 x Per Day/30 Days Discharge Instructions: As directed: dry, moistened with saline or moistened with Dakins Solution Secured With: Medipore T - 21M Medipore H Soft Cloth Surgical T ape ape, 2x2 (in/yd) (Generic) 1 x Per Day/30 Days 1. I would recommend that we have the patient continue to monitor for any signs of infection or worsening. I do believe that the patient is making some pretty good progress but nonetheless I do think that he is going to require switch to silver cell.  He needs something to catch the drainage more I think this is good to be beneficial. 2. I am good recommend as well continuation of using the ABD pad and gauze over top to try to bolster a little bit and catch the excessive drainage she is securing this in place and doing quite well in that regard but nonetheless she is draining more than what I expected. We will see patient back for reevaluation in 1 week here in the clinic. If anything worsens or changes patient will contact our office for additional recommendations. Electronic Signature(s) Signed: 07/31/2022 4:51:57 PM By: Douglas Derry PA-C Previous Signature: 07/31/2022 8:21:07 AM Version By: Douglas Derry PA-C Entered By: Douglas [name] on 07/31/2022 16:51:57 -------------------------------------------------------------------------------- SuperBill Details Patient Name: Date of Service: Douglas Lime RD L. 07/31/2022 Medical Record Number: [id number] Patient Account Number: [id number] Date of Birth/Sex: Treating RN: [birth year] (77 y.o. Douglas [name] Primary Care Provider: Marikay [name] Other Clinician: Referring Provider: Treating Provider/Extender: Douglas [name] in Treatment: 1 Diagnosis Coding ICD-10 Codes Code Description SAYED, APOSTOL ([id number]) [id number] Page 6 of 6 E11.621 Type 2 diabetes mellitus with  foot ulcer L97.522 Non-pressure chronic ulcer of other part of left foot with fat layer exposed E11.40 Type 2 diabetes mellitus with diabetic neuropathy, unspecified I10 Essential (primary) hypertension Facility Procedures : CPT4 Code: [id number] Description: 11042 - DEB SUBQ TISSUE 20 SQ CM/< ICD-10 Diagnosis Description L97.522 Non-pressure chronic ulcer of other part of left foot with fat layer exposed Modifier: Quantity: 1 : CPT4 Code: [id number] Description: 11045 - DEB SUBQ TISS EA ADDL 20CM ICD-10 Diagnosis Description L97.522 Non-pressure chronic ulcer of other part of left foot with fat layer exposed Modifier: Quantity: 3 Physician Procedures : CPT4 Code Description Modifier [id number] 11042 - WC PHYS SUBQ TISS 20 SQ CM ICD-10 Diagnosis Description L97.522 Non-pressure chronic ulcer of other part of left foot with fat layer exposed Quantity: 1 : [id number] 11045 - WC PHYS SUBQ TISS EA ADDL 20 CM ICD-10 Diagnosis Description L97.522 Non-pressure chronic ulcer of other part of left foot with fat layer exposed Quantity: 3 Electronic Signature(s) Signed: 07/31/2022 4:52:05 PM By: Douglas Derry PA-C Entered By: Douglas [name] on 07/31/2022 16:52:05

## 2022-08-01 NOTE — Progress Notes (Signed)
AVYUKT, CIMO ([id number]) [id number] Page 1 of 8 Visit Report for 07/31/2022 Arrival Information Details Patient Name: Date of Service: Douglas [name], [name] 07/31/2022 7:45 A M Medical Record Number: [id number] Patient Account Number: [id number] Date of Birth/Sex: Treating RN: 08/30/1945 (77 y.o. Douglas [name] Primary Care Douglas [name]: Douglas [name] Other Clinician: Referring Douglas [name]: Treating Douglas [name]/Extender: Douglas [name] in Treatment: 1 Visit Information History Since Last Visit Added or deleted any medications: No Patient Arrived: Ambulatory Has Dressing in Place as Prescribed: Yes Arrival Time: 07:40 Pain Present Now: No Accompanied By: self Transfer Assistance: None Patient Identification Verified: Yes Secondary Verification Process Completed: Yes Patient Requires Transmission-Based Precautions: No Patient Has Alerts: Yes Patient Alerts: Diabetes type 2 ASA  Electronic Signature(s) Signed: 07/31/2022 4:52:52 PM By: Douglas Aver MSN RN CNS WTA Entered By: Douglas [name] on 07/31/2022 07:45:32 -------------------------------------------------------------------------------- Clinic Level of Care Assessment Details Patient Name: Date of Service: Douglas [name], [name] 07/31/2022 7:45 A M Medical Record Number: [id number] Patient Account Number: [id number] Date of Birth/Sex: Treating RN: 23-May-1945 (77 y.o. Douglas [name] Primary Care Douglas [name]: Douglas [name] Other Clinician: Referring Douglas [name]: Treating Douglas [name] in Treatment: 1 Clinic Level of Care Assessment Items TOOL 1 Quantity Score  - 0 Use when EandM and Procedure is performed on INITIAL visit ASSESSMENTS - Nursing Assessment / Reassessment  - 0 General Physical Exam (combine w/ comprehensive assessment (listed just below) when performed on new pt. evals)  - 0 Comprehensive Assessment (HX, ROS, Risk  Assessments, Wounds Hx, etc.) ASSESSMENTS - Wound and Skin Assessment / Reassessment  - 0 Dermatologic / Skin Assessment (not related to wound area) ASSESSMENTS - Ostomy and/or Continence Assessment and Care  - 0 Incontinence Assessment and Management  - 0 Ostomy Care Assessment and Management (repouching, etc.) PROCESS - Coordination of Care  - 0 Simple Patient / Family Education for ongoing care  - 0 Complex (extensive) Patient / Family Education for ongoing care  - 0 Staff obtains Chiropractor, Records, T Results / Process Orders est  - 0 Staff telephones HHA, Nursing Homes / Clarify orders / etc  - 0 Routine Transfer to another Facility (non-emergent condition)  - 0 Routine Hospital Admission (non-emergent condition)  - 0 New Admissions / Manufacturing engineer / Ordering NPWT Apligraf, etc. ,  - 0 Emergency Hospital Admission (emergent condition) Douglas [name], Douglas [name] ([id number]) [id number] Page 2 of 8 PROCESS - Special Needs  - 0 Pediatric / Minor Patient Management  - 0 Isolation Patient Management  - 0 Hearing / Language / Visual special needs  - 0 Assessment of Community assistance (transportation, D/C planning, etc.)  - 0 Additional assistance / Altered mentation  - 0 Support Surface(s) Assessment (bed, cushion, seat, etc.) INTERVENTIONS - Miscellaneous  - 0 External ear exam  - 0 Patient Transfer (multiple staff / Nurse, adult / Similar devices)  - 0 Simple Staple / Suture removal (25 or less)  - 0 Complex Staple / Suture removal (26 or more)  - 0 Hypo/Hyperglycemic Management (do not check if billed separately)  - 0 Ankle / Brachial Index (ABI) - do not check if billed separately Has the patient been seen at the hospital within the last three years: Yes Total Score: 0 Level Of Care: ____ Electronic Signature(s) Signed: 07/31/2022 4:52:52 PM By: Douglas Aver MSN RN CNS WTA Entered  By: Douglas [name] on 07/31/2022 08:14:57 -------------------------------------------------------------------------------- Encounter Discharge Information Details Patient Name: Date of Service: Douglas Lime RD L. 07/31/2022 7:45 A  M Medical Record Number: [id number] Patient Account Number: [id number] Date of Birth/Sex: Treating RN: Jun 11, 1945 (77 y.o. Douglas [name] Primary Care Ninoska Goswick: Douglas [name] Other Clinician: Referring Jaaliyah Lucatero: Treating Stockton Nunley/Extender: Douglas [name] in Treatment: 1 Encounter Discharge Information Items Discharge Condition: Stable Ambulatory Status: Ambulatory Discharge Destination: Home Transportation: Private Auto Accompanied By: self Schedule Follow-up Appointment: Yes Clinical Summary of Care: Electronic Signature(s) Signed: 07/31/2022 4:52:52 PM By: Douglas Aver MSN RN CNS WTA Entered By: Douglas [name] on 07/31/2022 08:25:26 -------------------------------------------------------------------------------- Lower Extremity Assessment Details Patient Name: Date of Service: Douglas Lime RD L. 07/31/2022 7:45 A M Medical Record Number: [id number] Patient Account Number: [id number] Date of Birth/Sex: Treating RN: 1945-11-03 (77 y.o. Douglas [name] Primary Care Calixto Pavel: Douglas [name] Other Clinician: Referring Aemilia Dedrick: Treating Sherby Moncayo/Extender: Douglas [name] in Treatment: 1 Edema Assessment Assessed: Douglas [name]: Yes] [name]: No] [Left: Edema] [name]: :] [name] ([id number])] [Right: [id number] Page 3 of 8] Calf Left: Right: Point of Measurement: 35 cm From Medial Instep 39.5 cm Ankle Left: Right: Point of Measurement: 12 cm From Medial Instep 26.5 cm Knee To Floor Left: Right: From Medial Instep 43 cm Vascular Assessment Pulses: Dorsalis Pedis Palpable: [Left:Yes] Electronic Signature(s) Signed: 07/31/2022 4:52:52 PM By: Douglas Aver MSN RN CNS  WTA Entered By: Douglas [name] on 07/31/2022 07:54:19 -------------------------------------------------------------------------------- Multi Wound Chart Details Patient Name: Date of Service: Douglas Lime RD L. 07/31/2022 7:45 A M Medical Record Number: [id number] Patient Account Number: [id number] Date of Birth/Sex: Treating RN: 1946-01-08 (77 y.o. Douglas [name] Primary Care Kiree Dejarnette: Douglas [name] Other Clinician: Referring Akesha Uresti: Treating Judythe Postema/Extender: Douglas [name] in Treatment: 1 Vital Signs Height(in): 72 Pulse(bpm): 80 Weight(lbs): 253 Blood Pressure(mmHg): 144/81 Body Mass Index(BMI): 34.3 Temperature(F): 98.1 Respiratory Rate(breaths/min): 16 [1:Photos:] [N/A:N/A] Left, Circumferential T Great oe N/A N/A Wound Location: Shear/Friction N/A N/A Wounding Event: Diabetic Wound/Ulcer of the Lower N/A N/A Primary Etiology: Extremity Sleep Apnea, Hypertension, Type II N/A N/A Comorbid History: Diabetes, Neuropathy 07/17/2022 N/A N/A Date Acquired: 1 N/A N/A Weeks of Treatment: Open N/A N/A Wound Status: No N/A N/A Wound Recurrence: Yes N/A N/A Pending A mputation on Presentation: 6.5x11x0.1 N/A N/A Measurements L x W x D (cm) 56.156 N/A N/A A (cm) : rea 5.616 N/A N/A Volume (cm) : -6.40% N/A N/A % Reduction in A rea: -6.40% N/A N/A % Reduction in Volume: Grade 1 N/A N/A Classification: Medium N/A N/A Exudate A mount: Serosanguineous N/A N/A Exudate Type: red, brown N/A N/A Exudate Color: Medium (34-66%) N/A N/A Granulation A mount: Douglas [name], Douglas [name] ([id number]) [id number] Page 4 of 8 Red, Pink N/A N/A Granulation Quality: Small (1-33%) N/A N/A Necrotic Amount: Fat Layer (Subcutaneous Tissue): Yes N/A N/A Exposed Structures: Fascia: No Tendon: No Muscle: No Joint: No Bone: No Treatment Notes Electronic Signature(s) Signed: 07/31/2022 4:52:52 PM By: Douglas Aver MSN RN CNS  WTA Entered By: Douglas [name] on 07/31/2022 07:57:09 -------------------------------------------------------------------------------- Multi-Disciplinary Care Plan Details Patient Name: Date of Service: Douglas Lime RD L. 07/31/2022 7:45 A M Medical Record Number: [id number] Patient Account Number: [id number] Date of Birth/Sex: Treating RN: 09-09-1945 (77 y.o. Douglas [name] Primary Care Selicia Windom: Douglas [name] Other Clinician: Referring Jereld Presti: Treating Lanessa Shill/Extender: Douglas [name] in Treatment: 1 Active Inactive Necrotic Tissue Nursing Diagnoses: Impaired tissue integrity related to necrotic/devitalized tissue Knowledge deficit related to management of necrotic/devitalized tissue Goals: Necrotic/devitalized tissue will be minimized in the wound bed Date Initiated: 07/24/2022 Target Resolution Date: 08/23/2022 Goal Status: Active Patient/caregiver will verbalize  understanding of reason and process for debridement of necrotic tissue Date Initiated: 07/24/2022 Target Resolution Date: 08/23/2022 Goal Status: Active Interventions: Assess patient pain level pre-, during and post procedure and prior to discharge Provide education on necrotic tissue and debridement process Treatment Activities: Excisional debridement : 07/24/2022 Notes: Peripheral Neuropathy Nursing Diagnoses: Knowledge deficit related to disease process and management of peripheral neurovascular dysfunction Potential alteration in peripheral tissue perfusion (select prior to confirmation of diagnosis) Goals: Patient/caregiver will verbalize understanding of disease process and disease management Date Initiated: 07/24/2022 Target Resolution Date: 08/23/2022 Goal Status: Active Interventions: Assess signs and symptoms of neuropathy upon admission and as needed Provide education on Management of Neuropathy and Related Ulcers Provide education on Management of Neuropathy upon discharge from  the Wound Center Treatment Activities: Patient referred for customized footwear/offloading : 07/24/2022 Notes: Wound/Skin Impairment Douglas [name], Douglas [name] [id number] Page 5 of 8 Nursing Diagnoses: Impaired tissue integrity Knowledge deficit related to ulceration/compromised skin integrity Goals: Patient/caregiver will verbalize understanding of skin care regimen Date Initiated: 07/24/2022 Target Resolution Date: 08/23/2022 Goal Status: Active Ulcer/skin breakdown will have a volume reduction of 30% by week 4 Date Initiated: 07/24/2022 Target Resolution Date: 08/23/2022 Goal Status: Active Ulcer/skin breakdown will have a volume reduction of 50% by week 8 Date Initiated: 07/24/2022 Target Resolution Date: 09/23/2022 Goal Status: Active Ulcer/skin breakdown will have a volume reduction of 80% by week 12 Date Initiated: 07/24/2022 Target Resolution Date: 10/23/2022 Goal Status: Active Ulcer/skin breakdown will heal within 14 weeks Date Initiated: 07/24/2022 Target Resolution Date: 11/06/2022 Goal Status: Active Interventions: Assess patient/caregiver ability to obtain necessary supplies Assess patient/caregiver ability to perform ulcer/skin care regimen upon admission and as needed Assess ulceration(s) every visit Provide education on ulcer and skin care Treatment Activities: Referred to DME [facility] for dressing supplies : 07/24/2022 Skin care regimen initiated : 07/24/2022 Notes: Electronic Signature(s) Signed: 07/31/2022 4:52:52 PM By: Douglas Aver MSN RN CNS WTA Entered By: Douglas [name] on 07/31/2022 08:24:23 -------------------------------------------------------------------------------- Pain Assessment Details Patient Name: Date of Service: Douglas Lime RD L. 07/31/2022 7:45 A M Medical Record Number: [id number] Patient Account Number: [id number] Date of Birth/Sex: Treating RN: [birth year] (77 y.o. Douglas [name] Primary Care Bren Steers:  Douglas [name] Other Clinician: Referring Levia Waltermire: Treating Madelene Kaatz/Extender: Douglas [name] in Treatment: 1 Active Problems Location of Pain Severity and Description of Pain Patient Has Paino No Site Locations Pain Management and Medication Current Pain Management: Douglas [name], Douglas [name] ([id number]) [id number] Page 6 of 8 Electronic Signature(s) Signed: 07/31/2022 4:52:52 PM By: Douglas Aver MSN RN CNS WTA Entered By: Douglas [name] on 07/31/2022 07:47:56 -------------------------------------------------------------------------------- Patient/Caregiver Education Details Patient Name: Date of Service: Douglas Lime RD L. 4/18/2024andnbsp7:45 A M Medical Record Number: [id number] Patient Account Number: [id number] Date of Birth/Gender: Treating RN: 1946/02/15 (77 y.o. Douglas [name] Primary Care Physician: Douglas [name] Other Clinician: Referring Physician: Treating Physician/Extender: Douglas [name] in Treatment: 1 Education Assessment Education Provided To: Patient Education Topics Provided Wound Debridement: Handouts: Wound Debridement Methods: Explain/Verbal Responses: State content correctly Electronic Signature(s) Signed: 07/31/2022 4:52:52 PM By: Douglas Aver MSN RN CNS WTA Entered By: Douglas [name] on 07/31/2022 08:24:40 -------------------------------------------------------------------------------- Wound Assessment Details Patient Name: Date of Service: Douglas Lime RD L. 07/31/2022 7:45 A M Medical Record Number: [id number] Patient Account Number: [id number] Date of Birth/Sex: Treating RN: July 23, 1945 (77 y.o. Douglas [name] Primary Care Zeus Marquis: Douglas [name] Other Clinician: Referring Cortez Steelman: Treating Suren Payne/Extender: Douglas [name] in Treatment: 1 Wound Status Wound Number: 1 Primary  Etiology: Diabetic Wound/Ulcer of the Lower Extremity Wound Location:  Left, Circumferential T Great oe Wound Status: Open Wounding Event: Shear/Friction Comorbid History: Sleep Apnea, Hypertension, Type II Diabetes, Neuropathy Date Acquired: 07/17/2022 Weeks Of Treatment: 1 Clustered Wound: No Pending Amputation On Presentation Photos Wound Measurements Length: (cm) 6.5 Douglas [name], Douglas [name] Width: (cm) 11 Depth: (cm) 0.1 Area: (cm) 56 Volume: (cm) 5. % Reduction in Area: -6.4% [id number] Page 7 of 8 % Reduction in Volume: -6.4% .[id number] Wound Description Classification: Grade 1 Exudate Amount: Medium Exudate Type: Serosanguineous Exudate Color: red, brown Foul Odor After Cleansing: No Slough/Fibrino Yes Wound Bed Granulation Amount: Medium (34-66%) Exposed Structure Granulation Quality: Red, Pink Fascia Exposed: No Necrotic Amount: Small (1-33%) Fat Layer (Subcutaneous Tissue) Exposed: Yes Necrotic Quality: Adherent Slough Tendon Exposed: No Muscle Exposed: No Joint Exposed: No Bone Exposed: No Treatment Notes Wound #1 (Toe Great) Wound Laterality: Left, Circumferential Cleanser [place] Ancillary Kit - 15 Day Supply Discharge Instruction: Use supplies as instructed; Kit contains: (15) Saline Bullets; (15) 3x3 Gauze; 15 pr Gloves Soap and Water Discharge Instruction: Gently cleanse wound with antibacterial soap, rinse and pat dry prior to dressing wounds Peri-Wound Care Topical Primary Dressing Silvercel 4 1/4x 4 1/4 (in/in) Discharge Instruction: Apply Silvercel 4 1/4x 4 1/4 (in/in) as instructed Secondary Dressing ABD Pad 5x9 (in/in) Discharge Instruction: Cover with ABD pad Gauze Discharge Instruction: As directed: dry, moistened with saline or moistened with Dakins Solution Secured With Medipore T - 45M Medipore H Soft Cloth Surgical T ape ape, 2x2 (in/yd) Compression Wrap Compression Stockings Add-Ons Electronic Signature(s) Signed: 07/31/2022 4:52:52 PM By: Douglas Aver MSN RN CNS  WTA Entered By: Douglas [name] on 07/31/2022 07:52:32 -------------------------------------------------------------------------------- Vitals Details Patient Name: Date of Service: Douglas Lime RD L. 07/31/2022 7:45 A M Medical Record Number: [id number] Patient Account Number: [id number] Date of Birth/Sex: Treating RN: 1946-01-03 (77 y.o. Douglas [name] Primary Care Robertha Staples: Douglas [name] Other Clinician: Referring Sim Choquette: Treating Casey Maxfield/Extender: Douglas [name] in Treatment: 1 Vital Signs Time Taken: 07:45 Temperature (F): 98.1 Height (in): 72 Pulse (bpm): 80 Weight (lbs): 253 Respiratory Rate (breaths/min): 16 Body Mass Index (BMI): 34.3 Blood Pressure (mmHg): 144/81 Douglas [name], Douglas [name] ([id number]) [id number] Page 8 of 8 Reference Range: 80 - 120 mg / dl Electronic Signature(s) Signed: 07/31/2022 4:52:52 PM By: Douglas Aver MSN RN CNS WTA Entered By: Douglas [name] on 07/31/2022 07:47:35

## 2022-08-07 ENCOUNTER — Telehealth: Payer: Self-pay | Admitting: *Deleted

## 2022-08-07 ENCOUNTER — Ambulatory Visit: Payer: PPO | Admitting: Orthopedic Surgery

## 2022-08-07 ENCOUNTER — Ambulatory Visit: Payer: PPO | Admitting: Physician Assistant

## 2022-08-07 NOTE — Telephone Encounter (Signed)
Pt's wife [place] picked up medications.

## 2022-08-07 NOTE — Telephone Encounter (Signed)
Notified patient medication Ozempic ready for pick up in fridge. Labeled , patient wife will pickup per patient.

## 2022-08-08 ENCOUNTER — Encounter: Payer: PPO | Admitting: Physician Assistant

## 2022-08-08 DIAGNOSIS — L97522 Non-pressure chronic ulcer of other part of left foot with fat layer exposed: Secondary | ICD-10-CM | POA: Diagnosis not present

## 2022-08-08 DIAGNOSIS — E11621 Type 2 diabetes mellitus with foot ulcer: Secondary | ICD-10-CM | POA: Diagnosis not present

## 2022-08-09 NOTE — Progress Notes (Addendum)
[name], Douglas ([id number]) [id number] Page 1 of 6 Visit Report for 08/08/2022 Chief Complaint Document Details Patient Name: Date of Service: Douglas [name], [name] 08/08/2022 10:00 A M Medical Record Number: [id number] Patient Account Number: [id number] Date of Birth/Sex: Treating RN: May 30, 1945 (77 y.o. Douglas [name] Primary Care Provider: Marikay [name] Other Clinician: Referring Provider: Treating Provider/Extender: Douglas [name] in Treatment: 2 Information Obtained from: Patient Chief Complaint Left great toe ulcer Electronic Signature(s) Signed: 08/08/2022 10:09:27 AM By: Douglas Derry PA-C Entered By: Douglas [name] on 08/08/2022 10:09:27 -------------------------------------------------------------------------------- Debridement Details Patient Name: Date of Service: Douglas Lime RD L. 08/08/2022 10:00 A M Medical Record Number: [id number] Patient Account Number: [id number] Date of Birth/Sex: Treating RN: 14-Jul-1945 (78 y.o. Loel Lofty, Selena Batten Primary Care Provider: Marikay [name] Other Clinician: Referring Provider: Treating Provider/Extender: Douglas [name] in Treatment: 2 Debridement Performed for Assessment: Wound #1 Left,Circumferential T Great oe Performed By: Physician Douglas Derry, PA-C Debridement Type: Debridement Severity of Tissue Pre Debridement: Fat layer exposed Level of Consciousness (Pre-procedure): Awake and Alert Pre-procedure Verification/Time Out Yes - 10:13 Taken: Percent of Wound Bed Debrided: 10% T Area Debrided (cm): otal 3.67 Tissue and other material debrided: Non-Viable, Callus Level: Non-Viable Tissue Debridement Description: Selective/Open Wound Instrument: Curette Bleeding: Minimum Hemostasis Achieved: Pressure Response to Treatment: Procedure was tolerated well Level of Consciousness (Post- Awake and Alert procedure): Post Debridement Measurements of Total  Wound Length: (cm) 5.5 Width: (cm) 8.5 Depth: (cm) 0.2 Volume: (cm) 7.343 Character of Wound/Ulcer Post Debridement: Stable Severity of Tissue Post Debridement: Fat layer exposed Post Procedure Diagnosis Same as Pre-procedure Electronic Signature(s) Signed: 08/08/2022 12:43:01 PM By: Douglas Derry PA-C Signed: 08/08/2022 1:21:35 PM By: Douglas [name], BSN, RN, CWS, Kim RN, BSN Entered By: Douglas [name], BSN, RN, CWS, Douglas [name] on 08/08/2022 10:14:56 [name] ([id number]) [id number] Page 2 of 6 -------------------------------------------------------------------------------- HPI Details Patient Name: Date of Service: Douglas [name], [name] 08/08/2022 10:00 A M Medical Record Number: [id number] Patient Account Number: [id number] Date of Birth/Sex: Treating RN: [birth year] (77 y.o. Douglas [name] Primary Care Provider: Marikay [name] Other Clinician: Referring Provider: Treating Provider/Extender: Douglas [name] in Treatment: 2 History of Present Illness HPI Description: 07-24-2022 upon evaluation today patient presents for initial inspection here in our clinic concerning issues that has been having with a wound on his left great toe. This is actually a circumferential issue which was precipitated by the fact that he has neuropathy. Subsequently he was trying to do what he could do to help alleviate some of his discomfort and what he noted was that pressure to the toe actually helped relieve some of the neuropathic pain. So we decided he was going to wrap his toe with tape and it felt good so he decided to leave it through the night. Unfortunately this was too much compression that actually cut off the blood flow to the toe and because a degloving injury to the toe pretty much from the base to the tip of the great toe. Unfortunately this has not completely healed yet but fortunately I do not think it is likely going to end up is something that he will lose  but nonetheless this is still questionable at this point. The patient states that he knows he should have done this and is hopefully learned his lesson he tells me that he has not been more careful and not do this type of thing in the future. Patient does have a history of diabetes mellitus type 2,  diabetic neuropathy related to the diabetes type 2, hypertension, and the neuropathy in his case is fairly severe. His most recent hemoglobin A1c was 6.5 that was on March 05, 2022 he does take a 325 mg aspirin. 07-31-2022 upon evaluation today patient appears to be doing well currently in regard to his wound. He has been tolerating the dressing changes without complication and in general I feel like that he is actually making some really good progress here but at the same time draining quite significantly. I think we need to do something to improve the drainage aspect here he is in agreement with that plan. 08-08-2022 upon evaluation today patient appears to be doing well currently in regard to his toe ulcer. He has been tolerating the dressing changes without complication. Fortunately there does not appear to be any signs of active infection locally or systemically which is great news. No fevers, chills, nausea, vomiting, or diarrhea. Electronic Signature(s) Signed: 08/08/2022 10:35:21 AM By: Douglas Derry PA-C Entered By: Douglas [name] on 08/08/2022 10:35:21 -------------------------------------------------------------------------------- Physical Exam Details Patient Name: Date of Service: Douglas Lime RD L. 08/08/2022 10:00 A M Medical Record Number: [id number] Patient Account Number: [id number] Date of Birth/Sex: Treating RN: Feb 25, 1946 (77 y.o. Douglas [name] Primary Care Provider: Marikay [name] Other Clinician: Referring Provider: Treating Provider/Extender: Douglas [name] in Treatment: 2 Constitutional Well-nourished and well-hydrated in no acute  distress. Respiratory normal breathing without difficulty. Psychiatric this patient is able to make decisions and demonstrates good insight into disease process. Alert and Oriented x 3. pleasant and cooperative. Notes Upon inspection patient's wound bed actually showed signs of good granulation and epithelization at this point. Fortunately I do not see any evidence of infection which is great news and overall I am extremely pleased with where we stand currently. Electronic Signature(s) Signed: 08/08/2022 10:35:50 AM By: Douglas Derry PA-C Entered By: Douglas [name] on 08/08/2022 10:35:49 -------------------------------------------------------------------------------- Physician Orders Details Patient Name: Date of Service: Douglas Lime RD L. 08/08/2022 10:00 A M Medical Record Number: [id number] Patient Account Number: [id number] Date of Birth/Sex: Treating RN: 1945-09-21 (77 y.o. Douglas [name] Primary Care Provider: Marikay [name] Other Clinician: Referring Provider: Treating Provider/Extender: Douglas [name] in Treatment: 2 Douglas [name], Douglas [name] ([id number]) [id number] Page 3 of 6 Verbal / Phone Orders: No Diagnosis Coding ICD-10 Coding Code Description E11.621 Type 2 diabetes mellitus with foot ulcer L97.522 Non-pressure chronic ulcer of other part of left foot with fat layer exposed E11.40 Type 2 diabetes mellitus with diabetic neuropathy, unspecified I10 Essential (primary) hypertension Follow-up Appointments Return Appointment in 1 week. Bathing/ [employer] wounds with antibacterial soap and water. Wound Treatment Wound #1 - T Great oe Wound Laterality: Left, Circumferential Cleanser: Byram Ancillary Kit - 15 Day Supply (Generic) 1 x Per Day/30 Days Discharge Instructions: Use supplies as instructed; Kit contains: (15) Saline Bullets; (15) 3x3 Gauze; 15 pr Gloves Cleanser: Soap and Water 1 x Per Day/30 Days Discharge  Instructions: Gently cleanse wound with antibacterial soap, rinse and pat dry prior to dressing wounds Prim Dressing: Silvercel 4 1/4x 4 1/4 (in/in) 1 x Per Day/30 Days ary Discharge Instructions: Apply Silvercel 4 1/4x 4 1/4 (in/in) as instructed Secondary Dressing: Gauze (Generic) 1 x Per Day/30 Days Discharge Instructions: As directed: dry, moistened with saline or moistened with Dakins Solution Secured With: Medipore T - 5M Medipore H Soft Cloth Surgical T ape ape, 2x2 (in/yd) (Generic) 1 x Per Day/30 Days Electronic Signature(s) Signed: 08/08/2022 12:43:01 PM By: Douglas Derry  PA-C Signed: 08/08/2022 1:21:35 PM By: Douglas [name], BSN, RN, CWS, Kim RN, BSN Entered By: Douglas [name], BSN, RN, CWS, Douglas [name] on 08/08/2022 10:15:26 -------------------------------------------------------------------------------- Problem List Details Patient Name: Date of Service: Douglas Lime RD L. 08/08/2022 10:00 A M Medical Record Number: [id number] Patient Account Number: [id number] Date of Birth/Sex: Treating RN: Feb 05, 1946 (77 y.o. Douglas [name] Primary Care Provider: Marikay [name] Other Clinician: Referring Provider: Treating Provider/Extender: Douglas [name] in Treatment: 2 Active Problems ICD-10 Encounter Code Description Active Date MDM Diagnosis E11.621 Type 2 diabetes mellitus with foot ulcer 07/24/2022 No Yes L97.522 Non-pressure chronic ulcer of other part of left foot with fat layer exposed 07/24/2022 No Yes E11.40 Type 2 diabetes mellitus with diabetic neuropathy, unspecified 07/24/2022 No Yes I10 Essential (primary) hypertension 07/24/2022 No Yes Douglas [name], Douglas [name] ([id number]) [id number] Page 4 of 6 Inactive Problems Resolved Problems Electronic Signature(s) Signed: 08/08/2022 10:09:24 AM By: Douglas Derry PA-C Entered By: Douglas [name] on 08/08/2022 10:09:24 -------------------------------------------------------------------------------- Progress Note  Details Patient Name: Date of Service: Douglas Lime RD L. 08/08/2022 10:00 A M Medical Record Number: [id number] Patient Account Number: [id number] Date of Birth/Sex: Treating RN: 02-01-1946 (77 y.o. Douglas [name] Primary Care Provider: Marikay [name] Other Clinician: Referring Provider: Treating Provider/Extender: Douglas [name] in Treatment: 2 Subjective Chief Complaint Information obtained from Patient Left great toe ulcer History of Present Illness (HPI) 07-24-2022 upon evaluation today patient presents for initial inspection here in our clinic concerning issues that has been having with a wound on his left great toe. This is actually a circumferential issue which was precipitated by the fact that he has neuropathy. Subsequently he was trying to do what he could do to help alleviate some of his discomfort and what he noted was that pressure to the toe actually helped relieve some of the neuropathic pain. So we decided he was going to wrap his toe with tape and it felt good so he decided to leave it through the night. Unfortunately this was too much compression that actually cut off the blood flow to the toe and because a degloving injury to the toe pretty much from the base to the tip of the great toe. Unfortunately this has not completely healed yet but fortunately I do not think it is likely going to end up is something that he will lose but nonetheless this is still questionable at this point. The patient states that he knows he should have done this and is hopefully learned his lesson he tells me that he has not been more careful and not do this type of thing in the future. Patient does have a history of diabetes mellitus type 2, diabetic neuropathy related to the diabetes type 2, hypertension, and the neuropathy in his case is fairly severe. His most recent hemoglobin A1c was 6.5 that was on March 05, 2022 he does take a 325 mg aspirin. 07-31-2022 upon  evaluation today patient appears to be doing well currently in regard to his wound. He has been tolerating the dressing changes without complication and in general I feel like that he is actually making some really good progress here but at the same time draining quite significantly. I think we need to do something to improve the drainage aspect here he is in agreement with that plan. 08-08-2022 upon evaluation today patient appears to be doing well currently in regard to his toe ulcer. He has been tolerating the dressing changes without complication. Fortunately there does not appear to be any  signs of active infection locally or systemically which is great news. No fevers, chills, nausea, vomiting, or diarrhea. Objective Constitutional Well-nourished and well-hydrated in no acute distress. Vitals Time Taken: 9:59 AM, Height: 72 in, Weight: 253 lbs, BMI: 34.3, Temperature: 98.3 F, Pulse: 81 bpm, Respiratory Rate: 16 breaths/min, Blood Pressure: 127/68 mmHg. Respiratory normal breathing without difficulty. Psychiatric this patient is able to make decisions and demonstrates good insight into disease process. Alert and Oriented x 3. pleasant and cooperative. General Notes: Upon inspection patient's wound bed actually showed signs of good granulation and epithelization at this point. Fortunately I do not see any evidence of infection which is great news and overall I am extremely pleased with where we stand currently. Integumentary (Hair, Skin) Wound #1 status is Open. Original cause of wound was Shear/Friction. The date acquired was: 07/17/2022. The wound has been in treatment 2 weeks. The wound is located on the Left,Circumferential T Great. The wound measures 5.5cm length x 8.5cm width x 0.2cm depth; 36.717cm^2 area and 7.343cm^3 volume. oe There is Fat Layer (Subcutaneous Tissue) exposed. There is no tunneling or undermining noted. There is a medium amount of serosanguineous drainage noted. The  wound margin is flat and intact. There is medium (34-66%) red, pink granulation within the wound bed. There is a small (1-33%) amount of necrotic tissue within the wound bed including Adherent Slough. Douglas [name], Douglas [name] [id number] Page 5 of 6 Assessment Active Problems ICD-10 Type 2 diabetes mellitus with foot ulcer Non-pressure chronic ulcer of other part of left foot with fat layer exposed Type 2 diabetes mellitus with diabetic neuropathy, unspecified Essential (primary) hypertension Procedures Wound #1 Pre-procedure diagnosis of Wound #1 is a Diabetic Wound/Ulcer of the Lower Extremity located on the Left,Circumferential T Great .Severity of Tissue Pre oe Debridement is: Fat layer exposed. There was a Selective/Open Wound Non-Viable Tissue Debridement with a total area of 3.67 sq cm performed by Douglas Derry, PA-C. With the following instrument(s): Curette to remove Non-Viable tissue/material. Material removed includes Callus. No specimens were taken. A time out was conducted at 10:13, prior to the start of the procedure. A Minimum amount of bleeding was controlled with Pressure. The procedure was tolerated well. Post Debridement Measurements: 5.5cm length x 8.5cm width x 0.2cm depth; 7.343cm^3 volume. Character of Wound/Ulcer Post Debridement is stable. Severity of Tissue Post Debridement is: Fat layer exposed. Post procedure Diagnosis Wound #1: Same as Pre-Procedure Plan Follow-up Appointments: Return Appointment in 1 week. Bathing/ Shower/ Hygiene: Wash wounds with antibacterial soap and water. WOUND #1: - T Great Wound Laterality: Left, Circumferential oe Cleanser: Byram Ancillary Kit - 15 Day Supply (Generic) 1 x Per Day/30 Days Discharge Instructions: Use supplies as instructed; Kit contains: (15) Saline Bullets; (15) 3x3 Gauze; 15 pr Gloves Cleanser: Soap and Water 1 x Per Day/30 Days Discharge Instructions: Gently cleanse wound with  antibacterial soap, rinse and pat dry prior to dressing wounds Prim Dressing: Silvercel 4 1/4x 4 1/4 (in/in) 1 x Per Day/30 Days ary Discharge Instructions: Apply Silvercel 4 1/4x 4 1/4 (in/in) as instructed Secondary Dressing: Gauze (Generic) 1 x Per Day/30 Days Discharge Instructions: As directed: dry, moistened with saline or moistened with Dakins Solution Secured With: Medipore T - 18M Medipore H Soft Cloth Surgical T ape ape, 2x2 (in/yd) (Generic) 1 x Per Day/30 Days 1. I am good recommend that we have the patient continue to utilize the silver alginate I think this is doing better than the Xeroform he seems to making some good progress  here. 2. I am also can recommend the patient should continue to utilize the roll gauze to secure in place slightly it does not have to be too tight he knows this is doing a great job with that and I think we are definitely moving in the right direction. We will see patient back for reevaluation in 1 week here in the clinic. If anything worsens or changes patient will contact our office for additional recommendations. Electronic Signature(s) Signed: 08/08/2022 10:36:05 AM By: Douglas Derry PA-C Entered By: Douglas [name] on 08/08/2022 10:36:05 -------------------------------------------------------------------------------- SuperBill Details Patient Name: Date of Service: Douglas Lime RD L. 08/08/2022 Medical Record Number: [id number] Patient Account Number: [id number] Date of Birth/Sex: Treating RN: Feb 13, 1946 (77 y.o. Douglas [name] Primary Care Provider: Marikay [name] Other Clinician: Referring Provider: Treating Provider/Extender: Douglas [name] in Treatment: 2 Diagnosis Coding ICD-10 Codes Douglas [name], Douglas [name] ([id number]) [id number] Page 6 of 6 Code Description E11.621 Type 2 diabetes mellitus with foot ulcer L97.522 Non-pressure chronic ulcer of other part of left foot with fat layer  exposed E11.40 Type 2 diabetes mellitus with diabetic neuropathy, unspecified I10 Essential (primary) hypertension Facility Procedures : CPT4 Code: [id number] Description: 97597 - DEBRIDE WOUND 1ST 20 SQ CM OR < ICD-10 Diagnosis Description L97.522 Non-pressure chronic ulcer of other part of left foot with fat layer exposed Modifier: Quantity: 1 Physician Procedures : CPT4 Code Description Modifier [id number] - WC PHYS DEBR WO ANESTH 20 SQ CM ICD-10 Diagnosis Description L97.522 Non-pressure chronic ulcer of other part of left foot with fat layer exposed Quantity: 1 Electronic Signature(s) Signed: 08/08/2022 10:37:50 AM By: Douglas Derry PA-C Entered By: Douglas [name] on 08/08/2022 10:37:49

## 2022-08-09 NOTE — Progress Notes (Signed)
Douglas, [name] [phone].pdf Page 1 of 6 Visit Report for 08/08/2022 Arrival Information Details Patient Name: Date of Service: Douglas, [name] 08/08/2022 10:00 A M Medical Record Number: [id number] Patient Account Number: [id number] Date of Birth/Sex: Treating RN: [birth year] (77 y.o. Douglas [name] Primary Care Douglas [name]: Douglas [name] Other Clinician: Referring Douglas [name]: Treating Douglas [name]/Extender: Douglas [name] in Treatment: 2 Visit Information History Since Last Visit Added or deleted any medications: No Patient Arrived: Ambulatory Has Dressing in Place as Prescribed: Yes Arrival Time: 09:58 Has Footwear/Offloading in Place as Yes Accompanied By: self Prescribed: Transfer Assistance: None Left: Surgical Shoe with Pressure Relief Insole Patient Identification Verified: Yes Pain Present Now: No Secondary Verification Process Completed: Yes Patient Requires Transmission-Based Precautions: No Patient Has Alerts: Yes Patient Alerts: Diabetes type 2 ASA 325mg  Electronic Signature(s) Signed: 08/08/2022 1:21:35 PM By: Douglas [name], BSN, RN, CWS, Kim RN, BSN Entered By: Douglas [name], BSN, RN, CWS, Kim on 08/08/2022 09:59:24 -------------------------------------------------------------------------------- Encounter Discharge Information Details Patient Name: Date of Service: Douglas Lime RD L. 08/08/2022 10:00 A M Medical Record Number: [id number] Patient Account Number: [id number] Date of Birth/Sex: Treating RN: 1945/06/09 (77 y.o. Douglas [name] Primary Care Douglas [name]: Douglas [name] Other Clinician: Referring Douglas [name]: Treating Douglas [name]/Extender: Douglas [name] in Treatment: 2 Encounter Discharge Information Items Post Procedure Vitals Discharge Condition: Stable Temperature (F): 98.3 Ambulatory Status: Ambulatory Pulse (bpm): 81 Discharge Destination: Home Respiratory Rate (breaths/min):  16 Transportation: Private Auto Blood Pressure (mmHg): 127/68 Accompanied By: self Schedule Follow-up Appointment: Yes Clinical Summary of Care: Electronic Signature(s) Signed: 08/08/2022 1:21:35 PM By: Douglas [name], BSN, RN, CWS, Kim RN, BSN Entered By: Douglas [name], BSN, RN, CWS, Kim on 08/08/2022 10:25:08 -------------------------------------------------------------------------------- Lower Extremity Assessment Details Patient Name: Date of Service: Douglas Lime RD L. 08/08/2022 10:00 A M Medical Record Number: [id number] Patient Account Number: [id number] Date of Birth/Sex: Treating RN: [birth year] (77 y.o. Douglas [name] Primary Care Douglas [name]: Douglas [name] Other Clinician: Referring Douglas [name]: Treating Douglas [name]/Extender: Douglas [name] in Treatment: 2 Vascular Assessment Pulses: Dorsalis Pedis Palpable: [Left:Yes] [Right:[id number] Page 2 of 6] Electronic Signature(s) Signed: 08/08/2022 1:21:35 PM By: Douglas [name], BSN, RN, CWS, Kim RN, BSN Entered By: Douglas [name], BSN, RN, CWS, Kim on 08/08/2022 10:06:54 -------------------------------------------------------------------------------- Multi Wound Chart Details Patient Name: Date of Service: Douglas Lime RD L. 08/08/2022 10:00 A M Medical Record Number: [id number] Patient Account Number: [id number] Date of Birth/Sex: Treating RN: 31-Aug-1945 (77 y.o. Douglas [name] Primary Care Vanesha [place]: Douglas [name] Other Clinician: Referring Douglas [name]: Treating Douglas [name]/Extender: Douglas [name] in Treatment: 2 Vital Signs Height(in): 72 Pulse(bpm): 81 Weight(lbs): 253 Blood Pressure(mmHg): 127/68 Body Mass Index(BMI): 34.3 Temperature(F): 98.3 Respiratory Rate(breaths/min): 16 [1:Photos:] [N/A:N/A] Left, Circumferential T Great oe N/A N/A Wound Location: Shear/Friction N/A N/A Wounding Event: Diabetic Wound/Ulcer of the Lower N/A N/A Primary Etiology: Extremity Sleep Apnea,  Hypertension, Type II N/A N/A Comorbid History: Diabetes, Neuropathy 07/17/2022 N/A N/A Date Acquired: 2 N/A N/A Weeks of Treatment: Open N/A N/A Wound Status: No N/A N/A Wound Recurrence: Yes N/A N/A Pending A mputation on Presentation: 5.5x8.5x0.2 N/A N/A Measurements L x W x D (cm) 36.717 N/A N/A A (cm) : rea 7.343 N/A N/A Volume (cm) : 30.40% N/A N/A % Reduction in A rea: -39.10% N/A N/A % Reduction in Volume: Grade 1 N/A N/A Classification: Medium N/A N/A Exudate A mount: Serosanguineous N/A N/A Exudate Type: red, brown N/A N/A Exudate Color: Flat and Intact N/A N/A Wound Margin: Medium (34-66%) N/A N/A Granulation A mount: Red,  Pink N/A N/A Granulation Quality: Small (1-33%) N/A N/A Necrotic A mount: Fat Layer (Subcutaneous Tissue): Yes N/A N/A Exposed Structures: Fascia: No Tendon: No Muscle: No Joint: No Bone: No None N/A N/A Epithelialization: Douglas [name], Douglas [name] [id number] Page 3 of 6 Treatment Notes Electronic Signature(s) Signed: 08/08/2022 1:21:35 PM By: Douglas [name], BSN, RN, CWS, Kim RN, BSN Entered By: Douglas [name], BSN, RN, CWS, Kim on 08/08/2022 10:11:32 -------------------------------------------------------------------------------- Multi-Disciplinary Care Plan Details Patient Name: Date of Service: Douglas Lime RD L. 08/08/2022 10:00 A M Medical Record Number: [id number] Patient Account Number: [id number] Date of Birth/Sex: Treating RN: 1946-01-14 (77 y.o. Douglas [name], Douglas [name] Primary Care Douglas [name]: Douglas [name] Other Clinician: Referring Douglas [name]: Treating Douglas [name]/Extender: Douglas [name] in Treatment: 2 Active Inactive Necrotic Tissue Nursing Diagnoses: Impaired tissue integrity related to necrotic/devitalized tissue Knowledge deficit related to management of necrotic/devitalized tissue Goals: Necrotic/devitalized tissue will be minimized in the wound bed Date Initiated:  07/24/2022 Target Resolution Date: 08/23/2022 Goal Status: Active Patient/caregiver will verbalize understanding of reason and process for debridement of necrotic tissue Date Initiated: 07/24/2022 Target Resolution Date: 08/23/2022 Goal Status: Active Interventions: Assess patient pain level pre-, during and post procedure and prior to discharge Provide education on necrotic tissue and debridement process Treatment Activities: Excisional debridement : 07/24/2022 Notes: Wound/Skin Impairment Nursing Diagnoses: Impaired tissue integrity Knowledge deficit related to ulceration/compromised skin integrity Goals: Patient/caregiver will verbalize understanding of skin care regimen Date Initiated: 07/24/2022 Date Inactivated: 08/08/2022 Target Resolution Date: 08/23/2022 Goal Status: Met Ulcer/skin breakdown will have a volume reduction of 30% by week 4 Date Initiated: 07/24/2022 Target Resolution Date: 08/23/2022 Goal Status: Active Ulcer/skin breakdown will have a volume reduction of 50% by week 8 Date Initiated: 07/24/2022 Target Resolution Date: 09/23/2022 Goal Status: Active Ulcer/skin breakdown will have a volume reduction of 80% by week 12 Date Initiated: 07/24/2022 Target Resolution Date: 10/23/2022 Goal Status: Active Ulcer/skin breakdown will heal within 14 weeks Date Initiated: 07/24/2022 Target Resolution Date: 11/06/2022 Goal Status: Active Interventions: Assess patient/caregiver ability to obtain necessary supplies Assess patient/caregiver ability to perform ulcer/skin care regimen upon admission and as needed Assess ulceration(s) every visit Provide education on ulcer and skin care Treatment Activities: Douglas, [name] ([id number]) [id number] Page 4 of 6 Referred to DME Kurstyn Larios for dressing supplies : 07/24/2022 Skin care regimen initiated : 07/24/2022 Notes: Electronic Signature(s) Signed: 08/08/2022 1:21:35 PM By: Douglas [name], BSN, RN, CWS, Kim RN,  BSN Entered By: Douglas [name], BSN, RN, CWS, Kim on 08/08/2022 10:23:54 -------------------------------------------------------------------------------- Pain Assessment Details Patient Name: Date of Service: Douglas Lime RD L. 08/08/2022 10:00 A M Medical Record Number: [id number] Patient Account Number: [id number] Date of Birth/Sex: Treating RN: June 29, 1945 (77 y.o. Douglas [name] Primary Care Sheyla Zaffino: Douglas [name] Other Clinician: Referring Emari Demmer: Treating Ailana Cuadrado/Extender: Douglas [name] in Treatment: 2 Active Problems Location of Pain Severity and Description of Pain Patient Has Paino No Site Locations Pain Management and Medication Current Pain Management: Electronic Signature(s) Signed: 08/08/2022 1:21:35 PM By: Douglas [name], BSN, RN, CWS, Kim RN, BSN Entered By: Douglas [name], BSN, RN, CWS, Kim on 08/08/2022 10:00:22 -------------------------------------------------------------------------------- Patient/Caregiver Education Details Patient Name: Date of Service: Douglas Lime RD Elbert Ewings 4/26/2024andnbsp10:00 A M Medical Record Number: [id number] Patient Account Number: [id number] Date of Birth/Gender: Treating RN: 10/21/1945 (77 y.o. Douglas [name] Primary Care Physician: Douglas [name] Other Clinician: Referring Physician: Treating Physician/Extender: Douglas [name] in Treatment: 2 Education Assessment Education Provided To: Patient Education Topics Provided Wound/Skin Impairment: Handouts: Caring for Your Ulcer Douglas, [name] ([id number]) [id number]  Page 5 of 6 Methods: Demonstration, Explain/Verbal Responses: State content correctly Electronic Signature(s) Signed: 08/08/2022 1:21:35 PM By: Douglas [name], BSN, RN, CWS, Kim RN, BSN Entered By: Douglas [name], BSN, RN, CWS, Kim on 08/08/2022 10:24:07 -------------------------------------------------------------------------------- Wound Assessment Details Patient Name: Date of  Service: Douglas Lime RD L. 08/08/2022 10:00 A M Medical Record Number: [id number] Patient Account Number: [id number] Date of Birth/Sex: Treating RN: 1946-03-12 (77 y.o. Douglas [name], Douglas [name] Primary Care Price Lach[place]: Douglas [name] Other Clinician: Referring Kimia Finan: Treating Brina Umeda/Extender: Douglas [name] in Treatment: 2 Wound Status Wound Number: 1 Primary Etiology: Diabetic Wound/Ulcer of the Lower Extremity Wound Location: Left, Circumferential T Great oe Wound Status: Open Wounding Event: Shear/Friction Comorbid History: Sleep Apnea, Hypertension, Type II Diabetes, Neuropathy Date Acquired: 07/17/2022 Weeks Of Treatment: 2 Clustered Wound: No Pending Amputation On Presentation Photos Wound Measurements Length: (cm) 5.5 Width: (cm) 8.5 Depth: (cm) 0.2 Area: (cm) 36.717 Volume: (cm) 7.343 % Reduction in Area: 30.4% % Reduction in Volume: -39.1% Epithelialization: None Tunneling: No Undermining: No Wound Description Classification: Grade 1 Wound Margin: Flat and Intact Exudate Amount: Medium Exudate Type: Serosanguineous Exudate Color: red, brown Foul Odor After Cleansing: No Slough/Fibrino Yes Wound Bed Granulation Amount: Medium (34-66%) Exposed Structure Granulation Quality: Red, Pink Fascia Exposed: No Necrotic Amount: Small (1-33%) Fat Layer (Subcutaneous Tissue) Exposed: Yes Necrotic Quality: Adherent Slough Tendon Exposed: No Muscle Exposed: No Joint Exposed: No Bone Exposed: No Treatment Notes Wound #1 (Toe Great) Wound Laterality: Left, Circumferential Cleanser [employer] Ancillary Kit - 15 Day Supply Discharge Instruction: Use supplies as instructed; Kit contains: (15) Saline Bullets; (15) 3x3 Gauze; 15 pr Gloves Douglas, [name] ([id number]) [id number] Page 6 of 6 Soap and Water Discharge Instruction: Gently cleanse wound with antibacterial soap, rinse and pat dry prior to dressing wounds Peri-Wound  Care Topical Primary Dressing Silvercel 4 1/4x 4 1/4 (in/in) Discharge Instruction: Apply Silvercel 4 1/4x 4 1/4 (in/in) as instructed Secondary Dressing Gauze Discharge Instruction: As directed: dry, moistened with saline or moistened with Dakins Solution Secured With Medipore T - 50M Medipore H Soft Cloth Surgical T ape ape, 2x2 (in/yd) Compression Wrap Compression Stockings Facilities manager) Signed: 08/08/2022 1:21:35 PM By: Douglas [name], BSN, RN, CWS, Kim RN, BSN Entered By: Douglas [name], BSN, RN, CWS, Kim on 08/08/2022 10:06:43 -------------------------------------------------------------------------------- Vitals Details Patient Name: Date of Service: Douglas Lime RD L. 08/08/2022 10:00 A M Medical Record Number: [id number] Patient Account Number: [id number] Date of Birth/Sex: Treating RN: 06-06-1945 (77 y.o. Douglas [name] Primary Care Joline Encalada: Douglas [name] Other Clinician: Referring Keiarra Charon: Treating Zeddie Njie/Extender: Douglas [name] in Treatment: 2 Vital Signs Time Taken: 09:59 Temperature (F): 98.3 Height (in): 72 Pulse (bpm): 81 Weight (lbs): 253 Respiratory Rate (breaths/min): 16 Body Mass Index (BMI): 34.3 Blood Pressure (mmHg): 127/68 Reference Range: 80 - 120 mg / dl Electronic Signature(s) Signed: 08/08/2022 1:21:35 PM By: Douglas [name], BSN, RN, CWS, Kim RN, BSN Entered By: Douglas [name], BSN, RN, CWS, Kim on 08/08/2022 10:00:04

## 2022-08-18 ENCOUNTER — Encounter: Payer: PPO | Attending: Physician Assistant | Admitting: Physician Assistant

## 2022-08-18 DIAGNOSIS — I10 Essential (primary) hypertension: Secondary | ICD-10-CM | POA: Insufficient documentation

## 2022-08-18 DIAGNOSIS — E11621 Type 2 diabetes mellitus with foot ulcer: Secondary | ICD-10-CM | POA: Insufficient documentation

## 2022-08-18 DIAGNOSIS — E1140 Type 2 diabetes mellitus with diabetic neuropathy, unspecified: Secondary | ICD-10-CM | POA: Diagnosis not present

## 2022-08-18 DIAGNOSIS — L97522 Non-pressure chronic ulcer of other part of left foot with fat layer exposed: Secondary | ICD-10-CM | POA: Insufficient documentation

## 2022-08-18 NOTE — Progress Notes (Signed)
BRIGGSTON, STIGGERS ([id number]) [id number] Page 1 of 6 Visit Report for 08/18/2022 Chief Complaint Document Details Patient Name: Date of Service: Douglas [name], [name] 08/18/2022 9:30 A M Medical Record Number: [id number] Patient Account Number: [id number] Date of Birth/Sex: Treating RN: [birth year] (77 y.o. Roel Cluck Primary Care Provider: [name] Other Clinician: Referring Provider: Treating Provider/Extender: [name] in Treatment: 3 Information Obtained from: Patient Chief Complaint Left great toe ulcer Electronic Signature(s) Signed: 08/18/2022 9:57:21 AM By: Allen Derry PA-C Entered By: Allen Derry on 08/18/2022 09:57:21 -------------------------------------------------------------------------------- Debridement Details Patient Name: Date of Service: Douglas Lime RD L. 08/18/2022 9:30 A M Medical Record Number: [id number] Patient Account Number: [id number] Date of Birth/Sex: Treating RN: May 30, 1945 (77 y.o. Roel Cluck Primary Care Provider: [name] Other Clinician: Referring Provider: Treating Provider/Extender: [name] in Treatment: 3 Debridement Performed for Assessment: Wound #1 Left,Circumferential T Great oe Performed By: Physician Allen Derry, PA-C Debridement Type: Debridement Severity of Tissue Pre Debridement: Fat layer exposed Level of Consciousness (Pre-procedure): Awake and Alert Pre-procedure Verification/Time Out Yes - 10:00 Taken: Start Time: 10:00 Pain Control: Lidocaine 4% T opical Solution Percent of Wound Bed Debrided: 50% T Area Debrided (cm): otal 9.71 Tissue and other material debrided: Viable, Non-Viable, Callus, Slough, Subcutaneous, Slough Level: Skin/Subcutaneous Tissue Debridement Description: Excisional Instrument: Curette Bleeding: Minimum Hemostasis Achieved: Pressure Procedural Pain: 0 Post Procedural Pain: 0 Response to  Treatment: Procedure was tolerated well Level of Consciousness (Post- Awake and Alert procedure): Post Debridement Measurements of Total Wound Length: (cm) 4.5 Width: (cm) 5.5 Depth: (cm) 0.3 Volume: (cm) 5.832 Character of Wound/Ulcer Post Debridement: Stable Severity of Tissue Post Debridement: Fat layer exposed Post Procedure Diagnosis Same as Pre-procedure Electronic Signature(s) Signed: 08/18/2022 10:32:54 AM By: Allen Derry PA-C Signed: 08/18/2022 4:56:10 PM By: Midge Aver MSN RN CNS WTA Entered By: Allen Derry on 08/18/2022 10:32:54 Douglas [name] ([id number]) [id number] Page 2 of 6 -------------------------------------------------------------------------------- HPI Details Patient Name: Date of Service: Douglas [name], [name] 08/18/2022 9:30 A M Medical Record Number: [id number] Patient Account Number: [id number] Date of Birth/Sex: Treating RN: 1946-03-17 (77 y.o. Roel Cluck Primary Care Provider: [name] Other Clinician: Referring Provider: Treating Provider/Extender: [name] in Treatment: 3 History of Present Illness HPI Description: 07-24-2022 upon evaluation today patient presents for initial inspection here in our clinic concerning issues that has been having with a wound on his left great toe. This is actually a circumferential issue which was precipitated by the fact that he has neuropathy. Subsequently he was trying to do what he could do to help alleviate some of his discomfort and what he noted was that pressure to the toe actually helped relieve some of the neuropathic pain. So we decided he was going to wrap his toe with tape and it felt good so he decided to leave it through the night. Unfortunately this was too much compression that actually cut off the blood flow to the toe and because a degloving injury to the toe pretty much from the base to the tip of the great toe. Unfortunately this has  not completely healed yet but fortunately I do not think it is likely going to end up is something that he will lose but nonetheless this is still questionable at this point. The patient states that he knows he should have done this and is hopefully learned his lesson he tells me that he has not been more careful and not do this  type of thing in the future. Patient does have a history of diabetes mellitus type 2, diabetic neuropathy related to the diabetes type 2, hypertension, and the neuropathy in his case is fairly severe. His most recent hemoglobin A1c was 6.5 that was on March 05, 2022 he does take a 325 mg aspirin. 07-31-2022 upon evaluation today patient appears to be doing well currently in regard to his wound. He has been tolerating the dressing changes without complication and in general I feel like that he is actually making some really good progress here but at the same time draining quite significantly. I think we need to do something to improve the drainage aspect here he is in agreement with that plan. 08-08-2022 upon evaluation today patient appears to be doing well currently in regard to his toe ulcer. He has been tolerating the dressing changes without complication. Fortunately there does not appear to be any signs of active infection locally or systemically which is great news. No fevers, chills, nausea, vomiting, or diarrhea. 08-18-2022 upon evaluation today patient appears to be doing well currently in regard to his big toe. He is going require some debridement today but in general seems to be making some good progress. Fortunately I do not see any signs of active infection locally or systemically. Electronic Signature(s) Signed: 08/18/2022 10:28:16 AM By: Allen Derry PA-C Entered By: Allen Derry on 08/18/2022 10:28:16 -------------------------------------------------------------------------------- Physical Exam Details Patient Name: Date of Service: Douglas Lime RD L.  08/18/2022 9:30 A M Medical Record Number: [id number] Patient Account Number: [id number] Date of Birth/Sex: Treating RN: 11-03-1945 (77 y.o. Roel Cluck Primary Care Provider: [name] Other Clinician: Referring Provider: Treating Provider/Extender: [name] in Treatment: 3 Constitutional Well-nourished and well-hydrated in no acute distress. Respiratory normal breathing without difficulty. Psychiatric this patient is able to make decisions and demonstrates good insight into disease process. Alert and Oriented x 3. pleasant and cooperative. Notes Upon inspection patient's wound bed actually showed signs of good granulation epithelization at this point. Fortunately I do not see any evidence of active infection locally nor systemically which is great news and in general I do believe that we are moving in the right direction. I did perform a significant debridement to remove callus as well as slough and biofilm from the surface of the wound postdebridement this appears to be doing much better. Electronic Signature(s) Signed: 08/18/2022 10:29:19 AM By: Allen Derry PA-C Entered By: Allen Derry on 08/18/2022 10:29:19 Douglas [name] ([id number]) [id number] Page 3 of 6 -------------------------------------------------------------------------------- Physician Orders Details Patient Name: Date of Service: Douglas [name], [name] 08/18/2022 9:30 A M Medical Record Number: [id number] Patient Account Number: [id number] Date of Birth/Sex: Treating RN: 11-02-1945 (77 y.o. 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 Bathing/ [employer] wounds with antibacterial soap and water. Wound Treatment Wound #1 - T Great oe Wound Laterality: Left, Circumferential Cleanser: Byram Ancillary Kit - 15 Day Supply (Generic) 1 x Per Day/30 Days Discharge Instructions: Use supplies as instructed; Kit contains: (15) Saline Bullets; (15) 3x3 Gauze; 15 pr Gloves Cleanser: Soap and Water 1  x Per Day/30 Days Discharge Instructions: Gently cleanse wound with antibacterial soap, rinse and pat dry prior to dressing wounds Prim Dressing: Silvercel 4 1/4x 4 1/4 (in/in) (DME) (Dispense As Written) 1 x Per Day/30 Days ary Discharge Instructions: Apply Silvercel 4 1/4x 4 1/4 (in/in) as instructed Secondary Dressing: Gauze (DME) (Generic) 1 x Per Day/30 Days Discharge Instructions: As directed: dry, moistened with saline or moistened with Dakins Solution Secured With: Medipore T - 33M Medipore H Soft Cloth Surgical T ape ape, 2x2 (in/yd) (Generic) 1 x Per Day/30 Days Electronic Signature(s) Signed: 08/18/2022 4:56:10 PM By: Midge Aver MSN RN CNS WTA Signed: 08/18/2022 6:09:56 PM By: Allen Derry PA-C Entered By: Midge Aver on 08/18/2022 10:04:44 -------------------------------------------------------------------------------- Problem List Details Patient Name: Date of Service: Douglas Lime RD L. 08/18/2022 9:30 A M Medical Record Number: [id number] Patient Account Number: [id number] Date of Birth/Sex: Treating RN: [birth year] (77 y.o. 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 08/18/2022 9:30 A M Medical Record Number: [id number] Patient Account Number: [id number] Date of Birth/Sex: Treating RN: Oct 17, 1945 (77 y.o. Roel Cluck Primary Care Provider: [name] Other Clinician: Referring Provider: Treating Provider/Extender: [name] in Treatment: 3 Subjective Chief Complaint Information obtained from Patient Left great toe ulcer History of Present Illness (HPI) 07-24-2022 upon evaluation today patient presents for initial inspection here in our clinic concerning issues that has been having with a wound on his left great toe. This is actually a circumferential issue which was precipitated by the fact that he has neuropathy. Subsequently he was trying to do what he could do to help alleviate some of his discomfort and what he noted was that pressure to the toe actually helped relieve some of the neuropathic pain. So we decided he was going to wrap his toe with tape and it felt good so he decided to leave it through the night. Unfortunately this was too much compression that actually cut off the blood flow to the toe and because a degloving injury to the toe pretty much from the base to the tip of the great toe. Unfortunately this has not completely healed yet but fortunately I do not think it is  likely going to end up is something that he will lose but nonetheless this is still questionable at this point. The patient states that he knows he should have done this and is hopefully learned his lesson he tells me that he has not been more careful and not do this type of thing in the future. Patient does have a history of diabetes mellitus type 2, diabetic neuropathy related to the diabetes type 2, hypertension, and the neuropathy in his case is fairly severe. His most recent hemoglobin A1c was 6.5 that was on March 05, 2022 he does take a 325 mg aspirin. 07-31-2022 upon evaluation today  patient appears to be doing well currently in regard to his wound. He has been tolerating the dressing changes without complication and in general I feel like that he is actually making some really good progress here but at the same time draining quite significantly. I think we need to do something to improve the drainage aspect here he is in agreement with that plan. 08-08-2022 upon evaluation today patient appears to be doing well currently in regard to his toe ulcer. He has been tolerating the dressing changes without complication. Fortunately there does not appear to be any signs of active infection locally or systemically which is great news. No fevers, chills, nausea, vomiting, or diarrhea. 08-18-2022 upon evaluation today patient appears to be doing well currently in regard to his big toe. He is going require some debridement today but in general seems to be making some good progress. Fortunately I do not see any signs of active infection locally or systemically. Objective Constitutional Well-nourished and well-hydrated in no acute distress. Vitals Time Taken: 9:39 AM, Height: 72 in, Weight: 253 lbs, BMI: 34.3, Temperature: 98.2 F, Pulse: 86 bpm, Respiratory Rate: 16 breaths/min, Blood Pressure: 131/76 mmHg. Respiratory normal breathing without difficulty. Psychiatric this patient is able to make  decisions and demonstrates good insight into disease process. Alert and Oriented x 3. pleasant and cooperative. Douglas [name], Douglas [name] ([id number]) [id number] Page 5 of 6 General Notes: Upon inspection patient's wound bed actually showed signs of good granulation epithelization at this point. Fortunately I do not see any evidence of active infection locally nor systemically which is great news and in general I do believe that we are moving in the right direction. I did perform a significant debridement to remove callus as well as slough and biofilm from the surface of the wound postdebridement this appears to be doing much better. Integumentary (Hair, Skin) Wound #1 status is Open. Original cause of wound was Shear/Friction. The date acquired was: 07/17/2022. The wound has been in treatment 3 weeks. The wound is located on the Left,Circumferential T Great. The wound measures 4.5cm length x 5.5cm width x 0.2cm depth; 19.439cm^2 area and 3.888cm^3 volume. oe There is Fat Layer (Subcutaneous Tissue) exposed. There is a medium amount of serosanguineous drainage noted. The wound margin is flat and intact. There is medium (34-66%) red, pink granulation within the wound bed. There is a small (1-33%) amount of necrotic tissue within the wound bed. Assessment Active Problems ICD-10 Type 2 diabetes mellitus with foot ulcer Non-pressure chronic ulcer of other part of left foot with fat layer exposed Type 2 diabetes mellitus with diabetic neuropathy, unspecified Essential (primary) hypertension Procedures Wound #1 Pre-procedure diagnosis of Wound #1 is a Diabetic Wound/Ulcer of the Lower Extremity located on the Left,Circumferential T Great .Severity of Tissue Pre oe Debridement is: Fat layer exposed. There was a Excisional Skin/Subcutaneous Tissue Debridement with a total area of 9.71 sq cm performed by Allen Derry, PA-C. With the following instrument(s): Curette to remove Viable  and Non-Viable tissue/material. Material removed includes Callus, Subcutaneous Tissue, and Slough after achieving pain control using Lidocaine 4% Topical Solution. No specimens were taken. A time out was conducted at 10:00, prior to the start of the procedure. A Minimum amount of bleeding was controlled with Pressure. The procedure was tolerated well with a pain level of 0 throughout and a pain level of 0 following the procedure. Post Debridement Measurements: 4.5cm length x 5.5cm width x 0.3cm depth; 5.832cm^3 volume. Character of Wound/Ulcer Post Debridement is stable. Severity  of Tissue Post Debridement is: Fat layer exposed. Post procedure Diagnosis Wound #1: Same as Pre-Procedure Plan Follow-up Appointments: Return Appointment in 1 week. Bathing/ Shower/ Hygiene: Wash wounds with antibacterial soap and water. WOUND #1: - T Great Wound Laterality: Left, Circumferential oe Cleanser: Byram Ancillary Kit - 15 Day Supply (Generic) 1 x Per Day/30 Days Discharge Instructions: Use supplies as instructed; Kit contains: (15) Saline Bullets; (15) 3x3 Gauze; 15 pr Gloves Cleanser: Soap and Water 1 x Per Day/30 Days Discharge Instructions: Gently cleanse wound with antibacterial soap, rinse and pat dry prior to dressing wounds Prim Dressing: Silvercel 4 1/4x 4 1/4 (in/in) (DME) (Dispense As Written) 1 x Per Day/30 Days ary Discharge Instructions: Apply Silvercel 4 1/4x 4 1/4 (in/in) as instructed Secondary Dressing: Gauze (DME) (Generic) 1 x Per Day/30 Days Discharge Instructions: As directed: dry, moistened with saline or moistened with Dakins Solution Secured With: Medipore T - 67M Medipore H Soft Cloth Surgical T ape ape, 2x2 (in/yd) (Generic) 1 x Per Day/30 Days 1. Based on what I am seeing I do believe that again I recommend continuing with the silver alginate which is still doing a good job here. 2. Also can recommend the patient should continue to monitor for any signs of infection or  worsening. In general I think that we are making pretty good progress here overall. We will see patient back for reevaluation in 1 week here in the clinic. If anything worsens or changes patient will contact our office for additional recommendations. Electronic Signature(s) Signed: 08/18/2022 10:33:07 AM By: Allen Derry PA-C Previous Signature: 08/18/2022 10:30:45 AM Version By: Allen Derry PA-C Entered By: Allen Derry on 08/18/2022 10:33:07 -------------------------------------------------------------------------------- SuperBill Details Patient Name: Date of Service: Douglas Lime RD L. 08/18/2022 Douglas [name] [id number] Page 6 of 6 Medical Record Number: [id number] Patient Account Number: [id number] Date of Birth/Sex: Treating RN: 1945-12-19 (77 y.o. 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 E11.40 Type 2 diabetes mellitus with diabetic neuropathy, unspecified I10 Essential (primary) hypertension Facility Procedures : CPT4 Code: [id number] Description: 11042 - DEB SUBQ TISSUE 20 SQ CM/< ICD-10 Diagnosis Description L97.522 Non-pressure chronic ulcer of other part of left foot with fat layer exposed Modifier: Quantity: 1 Physician Procedures : CPT4 Code Description Modifier [id number] 11042 - WC PHYS SUBQ TISS 20 SQ CM ICD-10 Diagnosis Description L97.522 Non-pressure chronic ulcer of other part of left foot with fat layer exposed Quantity: 1 Electronic Signature(s) Signed: 08/18/2022 10:33:29 AM By: Allen Derry PA-C Entered By: Allen Derry on 08/18/2022 10:33:28

## 2022-08-19 ENCOUNTER — Ambulatory Visit: Payer: PPO

## 2022-08-19 VITALS — Wt 255.0 lb

## 2022-08-19 DIAGNOSIS — Z0000 Encounter for general adult medical examination without abnormal findings: Secondary | ICD-10-CM

## 2022-08-19 NOTE — Progress Notes (Signed)
MOSTYN, BUETER ([id number]) [id number] Page 1 of 8 Visit Report for 08/18/2022 Arrival Information Details Patient Name: Date of Service: Douglas [name], [name] 08/18/2022 9:30 A M Medical Record Number: [id number] Patient Account Number: [id number] Date of Birth/Sex: Treating RN: 1946/02/05 (77 y.o. Douglas [name] Primary Care Douglas [name]: Douglas [name] Other Clinician: Referring Douglas [name]: Treating Douglas [name]: Douglas [name] in Treatment: 3 Visit Information History Since Last Visit Added or deleted any medications: No Patient Arrived: Ambulatory Any new allergies or adverse reactions: No Arrival Time: 09:37 Had a fall or experienced change in No Accompanied By: self activities of daily living that may affect Transfer Assistance: None risk of falls: Patient Identification Verified: Yes Hospitalized since last visit: No Secondary Verification Process Completed: Yes Has Dressing in Place as Prescribed: Yes Patient Requires Transmission-Based Precautions: No Pain Present Now: No Patient Has Alerts: Yes Patient Alerts: Diabetes type 2 ASA 325mg  Electronic Signature(s) Signed: 08/18/2022 4:56:10 PM By: Douglas Aver MSN RN CNS WTA Entered By: Douglas [name] on 08/18/2022 10:08:54 -------------------------------------------------------------------------------- Clinic Level of Care Assessment Details Patient Name: Date of Service: Douglas Lime RD L. 08/18/2022 9:30 A M Medical Record Number: [id number] Patient Account Number: [id number] Date of Birth/Sex: Treating RN: 1945/10/28 (77 y.o. Douglas [name] Primary Care Riley Hallum: Douglas [name] Other Clinician: Referring Douglas [name]: Treating Douglas [name]: Douglas [name] in Treatment: 3 Clinic Level of Care Assessment Items TOOL 1 Quantity Score []  - 0 Use when EandM and Procedure is performed on INITIAL visit ASSESSMENTS - Nursing Assessment /  Reassessment []  - 0 General Physical Exam (combine w/ comprehensive assessment (listed just below) when performed on new pt. evals) []  - 0 Comprehensive Assessment (HX, ROS, Risk Assessments, Wounds Hx, etc.) ASSESSMENTS - Wound and Skin Assessment / Reassessment []  - 0 Dermatologic / Skin Assessment (not related to wound area) ASSESSMENTS - Ostomy and/or Continence Assessment and Care []  - 0 Incontinence Assessment and Management []  - 0 Ostomy Care Assessment and Management (repouching, etc.) PROCESS - Coordination of Care []  - 0 Simple Patient / Family Education for ongoing care []  - 0 Complex (extensive) Patient / Family Education for ongoing care []  - 0 Staff obtains Chiropractor, Records, T Results / Process Orders est []  - 0 Staff telephones HHA, Nursing Homes / Clarify orders / etc []  - 0 Routine Transfer to another Facility (non-emergent condition) []  - 0 Routine Hospital Admission (non-emergent condition) []  - 0 New Admissions / Insurance Authorizations / Ordering NPWT Apligraf, etc. , []  - 0 Emergency Hospital Admission (emergent condition) Douglas [name], Douglas [name] ([id number]) [phone].pdf Page 2 of 8 PROCESS - Special Needs []  - 0 Pediatric / Minor Patient Management []  - 0 Isolation Patient Management []  - 0 Hearing / Language / Visual special needs []  - 0 Assessment of Community assistance (transportation, D/C planning, etc.) []  - 0 Additional assistance / Altered mentation []  - 0 Support Surface(s) Assessment (bed, cushion, seat, etc.) INTERVENTIONS - Miscellaneous []  - 0 External ear exam []  - 0 Patient Transfer (multiple staff / Nurse, adult / Similar devices) []  - 0 Simple Staple / Suture removal (25 or less) []  - 0 Complex Staple / Suture removal (26 or more) []  - 0 Hypo/Hyperglycemic Management (do not check if billed separately) []  - 0 Ankle / Brachial Index (ABI) - do not check if billed separately Has the patient been  seen at the hospital within the last three years: Yes Total Score: 0 Level Of Care: ____ Electronic Signature(s) Signed: 08/18/2022 4:56:10 PM By:  Douglas Aver MSN RN CNS WTA Entered By: Douglas [name] on 08/18/2022 10:04:52 -------------------------------------------------------------------------------- Encounter Discharge Information Details Patient Name: Date of Service: Douglas [name], [name] 08/18/2022 9:30 A M Medical Record Number: [id number] Patient Account Number: [id number] Date of Birth/Sex: Treating RN: Jun 10, 1945 (77 y.o. Douglas [name] Primary Care Douglas [name]: Douglas [name] Other Clinician: Referring Latunya Kissick: Treating Douglas [name]: Douglas [name] in Treatment: 3 Encounter Discharge Information Items Post Procedure Vitals Discharge Condition: Stable Temperature (F): 98.2 Ambulatory Status: Ambulatory Pulse (bpm): 86 Discharge Destination: Home Respiratory Rate (breaths/min): 16 Transportation: Private Auto Blood Pressure (mmHg): 131/76 Accompanied By: self Schedule Follow-up Appointment: Yes Clinical Summary of Care: Electronic Signature(s) Signed: 08/18/2022 4:56:10 PM By: Douglas Aver MSN RN CNS WTA Entered By: Douglas [name] on 08/18/2022 10:06:12 -------------------------------------------------------------------------------- Lower Extremity Assessment Details Patient Name: Date of Service: Douglas Lime RD L. 08/18/2022 9:30 A M Medical Record Number: [id number] Patient Account Number: [id number] Date of Birth/Sex: Treating RN: May 10, 1945 (77 y.o. Douglas [name] Primary Care Douglas [name]: Douglas [name] Other Clinician: Referring Douglas [name]: Treating Douglas [name]: Douglas [name] in Treatment: 3 Electronic Signature(s) Signed: 08/18/2022 4:56:10 PM By: Douglas Aver MSN RN CNS WTA Entered By: Douglas [name] on 08/18/2022 09:47:14 Douglas [name] ([id number]) [id number] Page 3 of  8 -------------------------------------------------------------------------------- Multi Wound Chart Details Patient Name: Date of Service: [name], [name] 08/18/2022 9:30 A M Medical Record Number: [id number] Patient Account Number: [id number] Date of Birth/Sex: Treating RN: 1946-01-24 (77 y.o. Douglas [name] Primary Care Fares Ramthun: Douglas [name] Other Clinician: Referring Kailei Cowens: Treating Royer Cristobal/Extender: Douglas [name] in Treatment: 3 Vital Signs Height(in): 72 Pulse(bpm): 86 Weight(lbs): 253 Blood Pressure(mmHg): 131/76 Body Mass Index(BMI): 34.3 Temperature(F): 98.2 Respiratory Rate(breaths/min): 16 [1:Photos:] [N/A:N/A] Left, Circumferential T Great oe N/A N/A Wound Location: Shear/Friction N/A N/A Wounding Event: Diabetic Wound/Ulcer of the Lower N/A N/A Primary Etiology: Extremity Sleep Apnea, Hypertension, Type II N/A N/A Comorbid History: Diabetes, Neuropathy 07/17/2022 N/A N/A Date Acquired: 3 N/A N/A Weeks of Treatment: Open N/A N/A Wound Status: No N/A N/A Wound Recurrence: Yes N/A N/A Pending A mputation on Presentation: 4.5x5.5x0.2 N/A N/A Measurements L x W x D (cm) 19.439 N/A N/A A (cm) : rea 3.888 N/A N/A Volume (cm) : 63.20% N/A N/A % Reduction in A rea: 26.30% N/A N/A % Reduction in Volume: Grade 1 N/A N/A Classification: Medium N/A N/A Exudate A mount: Serosanguineous N/A N/A Exudate Type: red, brown N/A N/A Exudate Color: Flat and Intact N/A N/A Wound Margin: Medium (34-66%) N/A N/A Granulation A mount: Red, Pink N/A N/A Granulation Quality: Small (1-33%) N/A N/A Necrotic A mount: Fat Layer (Subcutaneous Tissue): Yes N/A N/A Exposed Structures: Fascia: No Tendon: No Muscle: No Joint: No Bone: No None N/A N/A Epithelialization: Debridement - Selective/Open Wound N/A N/A Debridement: Pre-procedure Verification/Time Out 10:00 N/A N/A Taken: Lidocaine 4% Topical Solution N/A N/A Pain  Control: Callus N/A N/A Tissue Debrided: Non-Viable Tissue N/A N/A Level: 9.71 N/A N/A Debridement A (sq cm): rea Curette N/A N/A Instrument: Minimum N/A N/A Bleeding: Pressure N/A N/A Hemostasis A chieved: 0 N/A N/A Procedural Pain: 0 N/A N/A Post Procedural Pain: Procedure was tolerated well N/A N/A Debridement Treatment Response: 4.5x5.5x0.3 N/A N/A Post Debridement Measurements L x W x D (cm) 5.832 N/A N/A Post Debridement Volume: (cm) Debridement N/A N/A Procedures Performed: Douglas [name], Douglas [name] ([id number]) [id number] Page 4 of 8 Treatment Notes Wound #1 (Toe Great) Wound Laterality: Left, Circumferential Cleanser Byram Ancillary Kit - 15 Day Supply Discharge Instruction: Use  supplies as instructed; Kit contains: (15) Saline Bullets; (15) 3x3 Gauze; 15 pr Gloves Soap and Water Discharge Instruction: Gently cleanse wound with antibacterial soap, rinse and pat dry prior to dressing wounds Peri-Wound Care Topical Primary Dressing Silvercel 4 1/4x 4 1/4 (in/in) Discharge Instruction: Apply Silvercel 4 1/4x 4 1/4 (in/in) as instructed Secondary Dressing Gauze Discharge Instruction: As directed: dry, moistened with saline or moistened with Dakins Solution Secured With Medipore T - 15M Medipore H Soft Cloth Surgical T ape ape, 2x2 (in/yd) Compression Wrap Compression Stockings Add-Ons Electronic Signature(s) Signed: 08/18/2022 4:56:10 PM By: Douglas Aver MSN RN CNS WTA Entered By: Douglas [name] on 08/18/2022 10:17:55 -------------------------------------------------------------------------------- Multi-Disciplinary Care Plan Details Patient Name: Date of Service: Douglas Lime RD L. 08/18/2022 9:30 A M Medical Record Number: [id number] Patient Account Number: [id number] Date of Birth/Sex: Treating RN: 12-Nov-1945 (77 y.o. Douglas [name] Primary Care Toben Acuna: Douglas [name] Other Clinician: Referring Rayla Pember: Treating  Floreen Teegarden/Extender: Douglas [name] in Treatment: 3 Active Inactive Necrotic Tissue Nursing Diagnoses: Impaired tissue integrity related to necrotic/devitalized tissue Knowledge deficit related to management of necrotic/devitalized tissue Goals: Necrotic/devitalized tissue will be minimized in the wound bed Date Initiated: 07/24/2022 Target Resolution Date: 08/23/2022 Goal Status: Active Patient/caregiver will verbalize understanding of reason and process for debridement of necrotic tissue Date Initiated: 07/24/2022 Target Resolution Date: 08/23/2022 Goal Status: Active Interventions: Assess patient pain level pre-, during and post procedure and prior to discharge Provide education on necrotic tissue and debridement process Treatment Activities: Excisional debridement : 07/24/2022 Notes: Wound/Skin Impairment Douglas [name], Douglas [name] ([id number]) [phone].pdf Page 5 of 8 Nursing Diagnoses: Impaired tissue integrity Knowledge deficit related to ulceration/compromised skin integrity Goals: Patient/caregiver will verbalize understanding of skin care regimen Date Initiated: 07/24/2022 Date Inactivated: 08/08/2022 Target Resolution Date: 08/23/2022 Goal Status: Met Ulcer/skin breakdown will have a volume reduction of 30% by week 4 Date Initiated: 07/24/2022 Target Resolution Date: 08/23/2022 Goal Status: Active Ulcer/skin breakdown will have a volume reduction of 50% by week 8 Date Initiated: 07/24/2022 Target Resolution Date: 09/23/2022 Goal Status: Active Ulcer/skin breakdown will have a volume reduction of 80% by week 12 Date Initiated: 07/24/2022 Target Resolution Date: 10/23/2022 Goal Status: Active Ulcer/skin breakdown will heal within 14 weeks Date Initiated: 07/24/2022 Target Resolution Date: 11/06/2022 Goal Status: Active Interventions: Assess patient/caregiver ability to obtain necessary supplies Assess patient/caregiver ability to  perform ulcer/skin care regimen upon admission and as needed Assess ulceration(s) every visit Provide education on ulcer and skin care Treatment Activities: Referred to DME Douglas [name] for dressing supplies : 07/24/2022 Skin care regimen initiated : 07/24/2022 Notes: Electronic Signature(s) Signed: 08/18/2022 4:56:10 PM By: Douglas Aver MSN RN CNS WTA Entered By: Douglas [name] on 08/18/2022 10:05:14 -------------------------------------------------------------------------------- Pain Assessment Details Patient Name: Date of Service: Douglas Lime RD L. 08/18/2022 9:30 A M Medical Record Number: [id number] Patient Account Number: [id number] Date of Birth/Sex: Treating RN: [birth year] (77 y.o. Douglas [name] Primary Care Shashank Kwasnik: Douglas [name] Other Clinician: Referring Avarey Yaeger: Treating Douglas [name]/Extender: Douglas [name] in Treatment: 3 Active Problems Location of Pain Severity and Description of Pain Patient Has Paino No Site Locations Pain Management and Medication Current Pain Management: Douglas [name], Douglas [name] ([id number]) [phone].pdf Page 6 of 8 Electronic Signature(s) Signed: 08/18/2022 4:56:10 PM By: Douglas Aver MSN RN CNS WTA Entered By: Douglas [name] on 08/18/2022 09:41:50 -------------------------------------------------------------------------------- Patient/Caregiver Education Details Patient Name: Date of Service: Douglas Lime RD Elbert Ewings 5/6/2024andnbsp9:30 A M Medical Record Number: [id number] Patient Account Number: [id number] Date of Birth/Gender: Treating RN: 16-Nov-1945 (77 y.o.  Douglas [name] Primary Care Physician: Douglas [name] Other Clinician: Referring Physician: Treating Physician/Extender: Douglas [name] in Treatment: 3 Education Assessment Education Provided To: Patient Education Topics Provided Wound/Skin Impairment: Handouts: Caring for Your Ulcer Methods: Explain/Verbal Responses:  State content correctly Electronic Signature(s) Signed: 08/18/2022 4:56:10 PM By: Douglas Aver MSN RN CNS WTA Entered By: Douglas [name] on 08/18/2022 10:05:25 -------------------------------------------------------------------------------- Wound Assessment Details Patient Name: Date of Service: Douglas Lime RD L. 08/18/2022 9:30 A M Medical Record Number: [id number] Patient Account Number: [id number] Date of Birth/Sex: Treating RN: [birth year] (77 y.o. Douglas [name] Primary Care Jermond Burkemper: Douglas [name] Other Clinician: Referring Tayden Duran: Treating Douglas [name]/Extender: Douglas [name] in Treatment: 3 Wound Status Wound Number: 1 Primary Etiology: Diabetic Wound/Ulcer of the Lower Extremity Wound Location: Left, Circumferential T Great oe Wound Status: Open Wounding Event: Shear/Friction Comorbid History: Sleep Apnea, Hypertension, Type II Diabetes, Neuropathy Date Acquired: 07/17/2022 Weeks Of Treatment: 3 Clustered Wound: No Pending Amputation On Presentation Photos Wound Measurements Length: (cm) 4.5 Douglas [name], Douglas [name] ([id number]) Width: (cm) 5.5 Depth: (cm) 0.2 Area: (cm) 19 Volume: (cm) 3. % Reduction in Area: 63.2% [id number] Page 7 of 8 % Reduction in Volume: 26.3% Epithelialization: None .[phone] Wound Description Classification: Grade 1 Wound Margin: Flat and Intact Exudate Amount: Medium Exudate Type: Serosanguineous Exudate Color: red, brown Foul Odor After Cleansing: No Slough/Fibrino Yes Wound Bed Granulation Amount: Medium (34-66%) Exposed Structure Granulation Quality: Red, Pink Fascia Exposed: No Necrotic Amount: Small (1-33%) Fat Layer (Subcutaneous Tissue) Exposed: Yes Tendon Exposed: No Muscle Exposed: No Joint Exposed: No Bone Exposed: No Treatment Notes Wound #1 (Toe Great) Wound Laterality: Left, Circumferential Cleanser Byram Ancillary Kit - 15 Day Supply Discharge Instruction: Use  supplies as instructed; Kit contains: (15) Saline Bullets; (15) 3x3 Gauze; 15 pr Gloves Soap and Water Discharge Instruction: Gently cleanse wound with antibacterial soap, rinse and pat dry prior to dressing wounds Peri-Wound Care Topical Primary Dressing Silvercel 4 1/4x 4 1/4 (in/in) Discharge Instruction: Apply Silvercel 4 1/4x 4 1/4 (in/in) as instructed Secondary Dressing Gauze Discharge Instruction: As directed: dry, moistened with saline or moistened with Dakins Solution Secured With Medipore T - 69M Medipore H Soft Cloth Surgical T ape ape, 2x2 (in/yd) Compression Wrap Compression Stockings Add-Ons Electronic Signature(s) Signed: 08/18/2022 4:56:10 PM By: Douglas Aver MSN RN CNS WTA Entered By: Douglas [name] on 08/18/2022 10:03:25 -------------------------------------------------------------------------------- Vitals Details Patient Name: Date of Service: Douglas Lime RD L. 08/18/2022 9:30 A M Medical Record Number: [id number] Patient Account Number: [id number] Date of Birth/Sex: Treating RN: [birth year] (77 y.o. Douglas [name] Primary Care Trinten Boudoin: Douglas [name] Other Clinician: Referring Antonin Meininger: Treating Douglas [name]/Extender: Douglas [name] in Treatment: 3 Vital Signs Time Taken: 09:39 Temperature (F): 98.2 Height (in): 72 Pulse (bpm): 86 Weight (lbs): 253 Respiratory Rate (breaths/min): 16 Body Mass Index (BMI): 34.3 Blood Pressure (mmHg): 131/76 Reference Range: 80 - 120 mg / dl LASH, HOLLOMON ([id number]) [id number] Page 8 of 8 Electronic Signature(s) Signed: 08/18/2022 4:56:10 PM By: Douglas Aver MSN RN CNS WTA Entered By: Douglas [name] on 08/18/2022 09:41:44

## 2022-08-19 NOTE — Progress Notes (Deleted)
Subjective:   Douglas [name] is a 77 y.o. male who presents for Medicare Annual/Subsequent preventive examination.  Review of Systems    I connected with  Douglas [name] on 08/19/22 by a audio enabled telemedicine application and verified that I am speaking with the correct person using two identifiers.  Patient Location: Home  Provider Location: Home Office  I discussed the limitations of evaluation and management by telemedicine. The patient expressed understanding and agreed to proceed.  Cardiac Risk Factors include: advanced age (>79men, >57 women);obesity (BMI >30kg/m2)     Objective:    Today's Vitals   08/19/22 1301 08/19/22 1302  Weight: 255 lb (115.7 kg)   PainSc:  4    Body mass index is 34.58 kg/m.     08/19/2022    1:13 PM 09/10/2021   12:47 PM 09/05/2020   11:44 AM 09/05/2019    1:24 PM 01/19/2019    7:28 PM 12/10/2018   10:55 AM 08/09/2018    3:58 PM  Advanced Directives  Does Patient Have a Medical Advance Directive? Yes Yes Yes Yes No No Yes  Type of Estate agent of [place];Living will Healthcare Power of [place];Living will Healthcare Power of [place];Living will Healthcare Power of [place];Living will   Healthcare Power of [place];Living will  Does patient want to make changes to medical advance directive?  No - Patient declined No - Patient declined No - Patient declined   No - Patient declined  Copy of Healthcare Power of Attorney in Chart? No - copy requested No - copy requested No - copy requested No - copy requested   No - copy requested    Current Medications (verified) Outpatient Encounter Medications as of 08/19/2022  Medication Sig   acetaminophen (TYLENOL) 500 MG tablet Take 500 mg by mouth every 6 (six) hours as needed.   aspirin 81 MG tablet Take 1 tablet (81 mg total) by mouth daily.   B Complex Vitamins (BL VITAMIN B COMPLEX PO) Take by mouth daily.   calcitRIOL (ROCALTROL) 0.25 MCG capsule Take 1 capsule  (0.25 mcg total) by mouth daily.   carvedilol (COREG) 6.25 MG tablet TAKE 1 TABLET(6.25 MG) BY MOUTH TWICE DAILY WITH A MEAL   Cholecalciferol (VITAMIN D3 PO) Take by mouth daily.   clotrimazole-betamethasone (LOTRISONE) cream Apply 1 application. topically 2 (two) times daily.   clove oil liquid Apply 1 application topically as needed.   cyclobenzaprine (FLEXERIL) 10 MG tablet Take 1 tablet by mouth as needed.   dapagliflozin propanediol ([name]) 10 MG TABS tablet Take 1 tablet (10 mg total) by mouth daily.   doxazosin (CARDURA) 4 MG tablet TAKE 1 TABLET(4 MG) BY MOUTH DAILY   DULoxetine (CYMBALTA) 60 MG capsule TAKE 1 CAPSULE(60 MG) BY MOUTH DAILY   esomeprazole (NEXIUM) 40 MG capsule Take 1 capsule (40 mg total) by mouth daily at 12 noon.   ferrous sulfate 325 (65 FE) MG tablet Take 1 tablet (325 mg total) by mouth daily with breakfast. Please dispense dye free formulation.   finasteride (PROSCAR) 5 MG tablet TAKE 1 TABLET(5 MG) BY MOUTH AT BEDTIME   furosemide (LASIX) 40 MG tablet TAKE 1 TABLET(40 MG) BY MOUTH DAILY   gentamicin cream (GARAMYCIN) 0.1 % Apply 1 application. topically 2 (two) times daily.   HYDROcodone-acetaminophen (NORCO/VICODIN) 5-325 MG tablet Take 1 tablet by mouth every 6 (six) hours as needed for moderate pain.   insulin degludec ([name] FLEXTOUCH) 200 UNIT/ML FlexTouch Pen Inject 40 Units into the skin  daily.   Insulin Pen Needle (NOVOFINE PEN NEEDLE) 32G X 6 MM MISC 1 each by Does not apply route 2 (two) times daily.   levothyroxine (SYNTHROID) 100 MCG tablet TAKE 1 TABLET(100 MCG) BY MOUTH DAILY BEFORE BREAKFAST   loratadine (CLARITIN) 10 MG tablet Take 10 mg by mouth daily as needed for allergies.   losartan (COZAAR) 25 MG tablet TAKE 1 TABLET BY MOUTH EVERY MORNING THEN TAKE 2 TABLETS BY MOUTH EVERY EVENING   MAGNESIUM PO Take by mouth daily at 8 pm.   meloxicam (MOBIC) 15 MG tablet Take 1 tablet (15 mg total) by mouth daily.   metFORMIN (GLUCOPHAGE-XR) 500 MG  24 hr tablet TAKE 2 TABLETS(1000 MG) BY MOUTH TWICE DAILY   mometasone (NASONEX) 50 MCG/ACT nasal spray Place 2 sprays into the nose daily.   Multiple Vitamins-Minerals (MULTIVITAMIN MEN PO) Take 1 tablet by mouth daily.   Needle, Disp, (HYPODERMIC NEEDLE 18GX1") 18G X 1" MISC Use every 14 days to draw up testosterone   Needle, Disp, (HYPODERMIC NEEDLE 25GX1") 25G X 1" MISC Use every 14 days to administer testosterone   pregabalin (LYRICA) 200 MG capsule TAKE 1 CAPSULE(200 MG) BY MOUTH TWICE DAILY   Semaglutide, 1 MG/DOSE, (OZEMPIC, 1 MG/DOSE,) 4 MG/3ML SOPN Inject 1 mg into the skin once a week.   simvastatin (ZOCOR) 40 MG tablet TAKE 1 TABLET(40 MG) BY MOUTH AT BEDTIME   sulfamethoxazole-trimethoprim (BACTRIM DS) 800-160 MG tablet Take 1 tablet by mouth 2 (two) times daily.   tadalafil (CIALIS) 20 MG tablet Take by mouth.   testosterone cypionate (DEPOTESTOSTERONE CYPIONATE) 200 MG/ML injection Inject 200 mg into the muscle every 14 (fourteen) days.   No facility-administered encounter medications on file as of 08/19/2022.    Allergies (verified) Bee venom, Dye fdc red [red dye], Omeprazole, Atenolol, Benadryl [diphenhydramine hcl (sleep)], Enalapril, Gabapentin, and Hctz [hydrochlorothiazide]   History: Past Medical History:  Diagnosis Date   Arthritis    Depression    Diabetes mellitus with complication (HCC)    Diabetic neuropathy (HCC)    Diastolic dysfunction    a. TTE 07/2017: EF 60-65%, mild concentric LVH, no RWMA, Gr1DD, trivial AI, mildly dilated LA, RVSF normal, PASP normal   Edema    feet/legs   GERD (gastroesophageal reflux disease)    Gout    History of stress test    a. MV 06/2017: small in size, mild in severity, apical anterior and apical defect that was minimally reversible and most likely represented artifact and less likely ischemia/scar, LVEF 55-65%, low risk, probably normal stress test   Hypertension    Hypothyroidism    Kidney cysts    renal failure 2013    Kidney stones    Dr. Artis Flock   OSA (obstructive sleep apnea)    supplemental oxygen at night   Oxygen dependent    hs   [facility] spotted fever 12/10/2017   Positve IgG in titer on 11/20/17   S/P cardiac cath 1998   a. no obstructive disease   Syncope and collapse    Temporary low platelet count (HCC)    Past Surgical History:  Procedure Laterality Date   ANAL FISSURE REPAIR     BACK SURGERY  1977   rupture disc lumbar spine   CARDIAC CATHETERIZATION  1998   [facility] with [facility].    CATARACT EXTRACTION W/PHACO Right 07/16/2016   Procedure: CATARACT EXTRACTION PHACO AND INTRAOCULAR LENS PLACEMENT (IOC);  Surgeon: Sallee Lange, MD;  [facility];  Service: Ophthalmology;  Laterality: Right;  [place] 01:11 AP% 17.6 CDE 24.83 fluid pack lot # [id number]   CATARACT EXTRACTION W/PHACO Left 08/13/2016   Procedure: CATARACT EXTRACTION PHACO AND INTRAOCULAR LENS PLACEMENT (IOC) suture placed in left eye at end of procedure;  Surgeon: Sallee Lange, MD;  Location: [facility] ORS;  Service: Ophthalmology;  Laterality: Left;  [place] 01:55 AP% 22.7 CDE 53.56 fluid pack lot # [id number]   FINGER AMPUTATION     partial   KNEE SURGERY  1993   arthroscopy   SHOULDER SURGERY Bilateral 1998   arthroscopic right, rotator cuff repair left   Family History  Problem Relation Age of Onset   Breast cancer Mother    Lung cancer Mother    Bone cancer Mother    Heart Problems Mother    Cancer Mother        breast, lung and rib   AAA (abdominal aortic aneurysm) Mother    Heart disease Mother    Heart attack Father    Heart disease Father    Cancer Brother        esophageal   Heart attack Brother    Colon cancer Neg Hx    Social History   Socioeconomic History   Marital status: Married    Spouse name: [place]   Number of children: 3   Years of education: 13   Highest education level: Not on file  Occupational History    Comment: retired  Tobacco Use   Smoking  status: Never   Smokeless tobacco: Never  Vaping Use   Vaping Use: Never used  Substance and Sexual Activity   Alcohol use: No   Drug use: No   Sexual activity: Yes  Other Topics Concern   Not on file  Social History Narrative   Lives in [place] with his wife ([place]). No children. Three step children. No pets.      Work - Patient is retired. Maintenance supervisor.      School - One year college education.      Right handed.      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               Social Determinants of Health   Financial Resource Strain: Low Risk  (08/19/2022)   Overall Financial Resource Strain (CARDIA)    Difficulty of Paying Living Expenses: Not hard at all  Food Insecurity: No Food Insecurity (08/19/2022)   Hunger Vital Sign    Worried About Running Out of Food in the Last Year: Never true    Ran Out of Food in the Last Year: Never true  Transportation Needs: No Transportation Needs (08/19/2022)   PRAPARE - Administrator, Civil Service (Medical): No    Lack of Transportation (Non-Medical): No  Physical Activity: Insufficiently Active (08/19/2022)   Exercise Vital Sign    Days of Exercise per Week: 7 days    Minutes of Exercise per Session: 20 min  Stress: No Stress Concern Present (08/19/2022)   [employer] of Occupational Health - Occupational Stress Questionnaire    Feeling of Stress : Not at all  Social Connections: Socially Integrated (08/19/2022)   Social Connection and Isolation Panel [NHANES]    Frequency of Communication with Friends and Family: More than three times a week    Frequency of Social Gatherings with Friends and Family: More than three times a week    Attends Religious Services: More than 4 times per year  Active Member of Clubs or Organizations: Yes    Attends Engineer, structural: More than 4 times per year    Marital Status: Married    Tobacco Counseling Counseling given: Yes   Clinical  Intake:  Pre-visit preparation completed: Yes  Pain : 0-10 Pain Score: 4  Pain Location: Foot Pain Orientation: Right, Left Pain Descriptors / Indicators: Aching Pain Onset: 1 to 4 weeks ago Pain Frequency: Several days a week     BMI - recorded: 34.58 Nutritional Status: BMI > 30  Obese Nutritional Risks: Non-healing wound (left foot) Diabetes: Yes CBG done?: No Did pt. bring in CBG monitor from home?: No  How often do you need to have someone help you when you read instructions, pamphlets, or other written materials from your doctor or pharmacy?: 1 - Never  Diabetic?Yes  Interpreter Needed?: No  Information entered by :: Fredirick Maudlin   Activities of Daily Living    08/19/2022    1:16 PM 09/10/2021   12:50 PM  In your present state of health, do you have any difficulty performing the following activities:  Hearing? 0 0  Vision? 0 0  Difficulty concentrating or making decisions? 0 0  Walking or climbing stairs? 0 0  Dressing or bathing? 0 0  Doing errands, shopping? 0 0  Preparing Food and eating ? N N  Using the Toilet? N N  In the past six months, have you accidently leaked urine? N N  Do you have problems with loss of bowel control? N N  Managing your Medications? N N  Managing your Finances? N N  Housekeeping or managing your Housekeeping? N N    Patient Care Team: Glori Luis, MD as PCP - General (Family Medicine) Iran Ouch, MD as PCP - Cardiology (Cardiology) Mady Haagensen, MD (Internal Medicine) Tera Partridge, PA as Physician Assistant (Physician Assistant)  Indicate any recent Medical Services you may have received from other than [facility] providers in the past year (date may be approximate).     Assessment:   This is a routine wellness examination for Kylian.  Hearing/Vision screen Hearing Screening - Comments:: Denies hearing difficulties   Vision Screening - Comments:: Wears rx glasses - up to date with routine eye exams  with  Dr [facility]  Dietary issues and exercise activities discussed: Current Exercise Habits: Home exercise routine, Type of exercise: walking;Other - see comments (yard work and walks dog), Time (Minutes): 60, Frequency (Times/Week): 5, Weekly Exercise (Minutes/Week): 300, Exercise limited by: neurologic condition(s)   Goals Addressed             This Visit's Progress    Maintain Healthy Lifestyle   On track    Walk as tolerated. Healthy diet.      Depression Screen    07/21/2022   12:16 PM 03/05/2022   11:00 AM 11/18/2021   11:48 AM 09/10/2021   12:45 PM 08/12/2021   11:35 AM 05/14/2021   12:01 PM 12/19/2020   11:22 AM  PHQ 2/9 Scores  PHQ - 2 Score 0 0 0 0 0 0 0  PHQ- 9 Score 0          Fall Risk    08/19/2022    1:16 PM 07/21/2022   12:16 PM 03/05/2022   11:00 AM 11/18/2021   11:48 AM 09/10/2021   12:48 PM  Fall Risk   Falls in the past year? 0 0 0 0 0  Number falls in past yr: 0  0 0 0 0  Injury with Fall? 0 0 0 0   Risk for fall due to : No Fall Risks No Fall Risks No Fall Risks No Fall Risks   Follow up Falls prevention discussed;Falls evaluation completed Falls evaluation completed Falls evaluation completed Falls evaluation completed Falls evaluation completed    FALL RISK PREVENTION PERTAINING TO THE HOME:  Any stairs in or around the home? Yes  If so, are there any without handrails? No  Home free of loose throw rugs in walkways, pet beds, electrical cords, etc? No  Adequate lighting in your home to reduce risk of falls? No   ASSISTIVE DEVICES UTILIZED TO PREVENT FALLS:  Life alert? No  Use of a cane, walker or w/c? No  Grab bars in the bathroom? Yes  Shower chair or bench in shower? Yes  Elevated toilet seat or a handicapped toilet? No   TIMED UP AND GO:  Was the test performed? No . Televisit   Cognitive Function:    07/03/2017   10:54 AM 07/02/2016   10:41 AM 07/03/2015   11:01 AM  MMSE - Mini Mental State Exam  Orientation to time 5 5 5    Orientation to Place 5 5 5   Registration 3 3 3   Attention/ Calculation 5 5 5   Recall 3 3 3   Language- name 2 objects 2 2 2   Language- repeat 1 1 1   Language- follow 3 step command 3 3 3   Language- read & follow direction 1 1 1   Write a sentence 1 1 1   Copy design 1 1 1   Total score 30 30 30         08/19/2022    1:09 PM 09/10/2021   12:52 PM 09/05/2019    1:30 PM 08/09/2018    4:19 PM  6CIT Screen  What Year? 0 points 0 points 0 points 0 points  What month? 0 points 0 points 0 points 0 points  What time? 0 points 0 points  0 points  Count back from 20 0 points 0 points  0 points  Months in reverse 0 points 0 points 0 points 0 points  Repeat phrase 0 points 0 points  0 points  Total Score 0 points 0 points  0 points    Immunizations Immunization History  Administered Date(s) Administered   Fluad Quad(high Dose 65+) 04/21/2019, 03/02/2020, 12/19/2020, 03/05/2022   Influenza, High Dose Seasonal PF 02/15/2016, 02/16/2017, 03/01/2018   Influenza,inj,Quad PF,6+ Mos 01/04/2013, 02/20/2014, 02/27/2015   Moderna Sars-Covid-2 Vaccination 10/10/2019, 11/07/2019   Pneumococcal Conjugate-13 05/02/2013   Pneumococcal Polysaccharide-23 01/05/2011   Tdap 01/05/2008    TDAP status: Due, Education has been provided regarding the importance of this vaccine. Advised may receive this vaccine at local pharmacy or Health Dept. Aware to provide a copy of the vaccination record if obtained from local pharmacy or Health Dept. Verbalized acceptance and understanding.  Flu Vaccine status: Up to date  Pneumococcal vaccine status: Due, Education has been provided regarding the importance of this vaccine. Advised may receive this vaccine at local pharmacy or Health Dept. Aware to provide a copy of the vaccination record if obtained from local pharmacy or Health Dept. Verbalized acceptance and understanding.  Covid-19 vaccine status: Information provided on how to obtain vaccines.   Qualifies for  Shingles Vaccine? Yes   Zostavax completed No   Shingrix Completed?: No.    Education has been provided regarding the importance of this vaccine. Patient has been advised to call insurance company to determine  out of pocket expense if they have not yet received this vaccine. Advised may also receive vaccine at local pharmacy or Health Dept. Verbalized acceptance and understanding.  Screening Tests Health Maintenance  Topic Date Due   Zoster Vaccines- Shingrix (1 of 2) Never done   DTaP/Tdap/Td (2 - Td or Tdap) 01/04/2018   OPHTHALMOLOGY EXAM  03/12/2021   FOOT EXAM  08/13/2022   HEMOGLOBIN A1C  09/03/2022   INFLUENZA VACCINE  11/13/2022   Diabetic kidney evaluation - eGFR measurement  03/06/2023   Diabetic kidney evaluation - Urine ACR  03/06/2023   Medicare Annual Wellness (AWV)  08/19/2023   Pneumonia Vaccine 55+ Years old  Completed   Hepatitis C Screening  Completed   HPV VACCINES  Aged Out   COVID-19 Vaccine  Discontinued    Health Maintenance  Health Maintenance Due  Topic Date Due   Zoster Vaccines- Shingrix (1 of 2) Never done   DTaP/Tdap/Td (2 - Td or Tdap) 01/04/2018   OPHTHALMOLOGY EXAM  03/12/2021   FOOT EXAM  08/13/2022    Colorectal cancer screening: Type of screening: Colonoscopy. Completed 08/22/15. Repeat every 10 years  Lung Cancer Screening: (Low Dose CT Chest recommended if Age 30-80 years, 30 pack-year currently smoking OR have quit w/in 15years.) does not qualify.     Additional Screening:  Hepatitis C Screening: does qualify; Completed 06/08/15  Vision Screening: Recommended annual ophthalmology exams for early detection of glaucoma and other disorders of the eye. Is the patient up to date with their annual eye exam?  Yes  Who is the provider or what is the name of the office in which the patient attends annual eye exams? [facility] If pt is not established with a provider, would they like to be referred to a provider to establish care? No .    Dental Screening: Recommended annual dental exams for proper oral hygiene  Community Resource Referral / Chronic Care Management: CRR required this visit?  No   CCM required this visit?  No      Plan:     I have personally reviewed and noted the following in the patient's chart:   Medical and social history Use of alcohol, tobacco or illicit drugs  Current medications and supplements including opioid prescriptions. Patient is currently taking opioid prescriptions. Information provided to patient regarding non-opioid alternatives. Patient advised to discuss non-opioid treatment plan with their provider. Functional ability and status Nutritional status Physical activity Advanced directives List of other physicians Hospitalizations, surgeries, and ER visits in previous 12 months Vitals Screenings to include cognitive, depression, and falls Referrals and appointments  In addition, I have reviewed and discussed with patient certain preventive protocols, quality metrics, and best practice recommendations. A written personalized care plan for preventive services as well as general preventive health recommendations were provided to patient.     Annabell Sabal, CMA   08/19/2022   Nurse Notes: None

## 2022-08-19 NOTE — Patient Instructions (Signed)
Douglas [name] , Thank you for taking time to come for your Medicare Wellness Visit. I appreciate your ongoing commitment to your health goals. Please review the following plan we discussed and let me know if I can assist you in the future.   These are the goals we discussed:  Goals       Patient Stated     HEMOGLOBIN A1C < 7 (pt-stated)      Kidney function at 60      Other     Maintain Healthy Lifestyle      Walk as tolerated. Healthy diet.        This is a list of the screening recommended for you and due dates:  Health Maintenance  Topic Date Due   Zoster (Shingles) Vaccine (1 of 2) Never done   DTaP/Tdap/Td vaccine (2 - Td or Tdap) 01/04/2018   Eye exam for diabetics  03/12/2021   Complete foot exam   08/13/2022   Hemoglobin A1C  09/03/2022   Flu Shot  11/13/2022   Yearly kidney function blood test for diabetes  03/06/2023   Yearly kidney health urinalysis for diabetes  03/06/2023   Medicare Annual Wellness Visit  08/19/2023   Pneumonia Vaccine  Completed   Hepatitis C Screening: USPSTF Recommendation to screen - Ages 18-79 yo.  Completed   HPV Vaccine  Aged Out   COVID-19 Vaccine  Discontinued    Advanced directives: Please bring a copy of your health care power of attorney and living will to the office to be added to your chart at your convenience. Managing Pain Without Opioids Opioids are strong medicines used to treat moderate to severe pain. For some people, especially those who have long-term (chronic) pain, opioids may not be the best choice for pain management due to: Side effects like nausea, constipation, and sleepiness. The risk of addiction (opioid use disorder). The longer you take opioids, the greater your risk of addiction. Pain that lasts for more than 3 months is called chronic pain. Managing chronic pain usually requires more than one approach and is often provided by a team of health care providers working together (multidisciplinary approach). Pain  management may be done at a pain management center or pain clinic. How to manage pain without the use of opioids Use non-opioid medicines Non-opioid medicines for pain may include: Over-the-counter or prescription non-steroidal anti-inflammatory drugs (NSAIDs). These may be the first medicines used for pain. They work well for muscle and bone pain, and they reduce swelling. Acetaminophen. This over-the-counter medicine may work well for milder pain but not swelling. Antidepressants. These may be used to treat chronic pain. A certain type of antidepressant (tricyclics) is often used. These medicines are given in lower doses for pain than when used for depression. Anticonvulsants. These are usually used to treat seizures but may also reduce nerve (neuropathic) pain. Muscle relaxants. These relieve pain caused by sudden muscle tightening (spasms). You may also use a pain medicine that is applied to the skin as a patch, cream, or gel (topical analgesic), such as a numbing medicine. These may cause fewer side effects than medicines taken by mouth. Do certain therapies as directed Some therapies can help with pain management. They include: Physical therapy. You will do exercises to gain strength and flexibility. A physical therapist may teach you exercises to move and stretch parts of your body that are weak, stiff, or painful. You can learn these exercises at physical therapy visits and practice them at home. Physical therapy may  also involve: Massage. Heat wraps or applying heat or cold to affected areas. Electrical signals that interrupt pain signals (transcutaneous electrical nerve stimulation, TENS). Weak lasers that reduce pain and swelling (low-level laser therapy). Signals from your body that help you learn to regulate pain (biofeedback). Occupational therapy. This helps you to learn ways to function at home and work with less pain. Recreational therapy. This involves trying new activities or  hobbies, such as a physical activity or drawing. Mental health therapy, including: Cognitive behavioral therapy (CBT). This helps you learn coping skills for dealing with pain. Acceptance and commitment therapy (ACT) to change the way you think and react to pain. Relaxation therapies, including muscle relaxation exercises and mindfulness-based stress reduction. Pain management counseling. This may be individual, family, or group counseling.  Receive medical treatments Medical treatments for pain management include: Nerve block injections. These may include a pain blocker and anti-inflammatory medicines. You may have injections: Near the spine to relieve chronic back or neck pain. Into joints to relieve back or joint pain. Into nerve areas that supply a painful area to relieve body pain. Into muscles (trigger point injections) to relieve some painful muscle conditions. A medical device placed near your spine to help block pain signals and relieve nerve pain or chronic back pain (spinal cord stimulation device). Acupuncture. Follow these instructions at home Medicines Take over-the-counter and prescription medicines only as told by your health care provider. If you are taking pain medicine, ask your health care providers about possible side effects to watch out for. Do not drive or use heavy machinery while taking prescription opioid pain medicine. Lifestyle  Do not use drugs or alcohol to reduce pain. If you drink alcohol, limit how much you have to: 0-1 drink a day for women who are not pregnant. 0-2 drinks a day for men. Know how much alcohol is in a drink. In the [place] one drink equals one 12 oz bottle of beer (355 mL), one 5 oz glass of wine (148 mL), or one 1 oz glass of hard liquor (44 mL). Do not use any products that contain nicotine or tobacco. These products include cigarettes, chewing tobacco, and vaping devices, such as e-cigarettes. If you need help quitting, ask your health  care provider. Eat a healthy diet and maintain a healthy weight. Poor diet and excess weight may make pain worse. Eat foods that are high in fiber. These include fresh fruits and vegetables, whole grains, and beans. Limit foods that are high in fat and processed sugars, such as fried and sweet foods. Exercise regularly. Exercise lowers stress and may help relieve pain. Ask your health care provider what activities and exercises are safe for you. If your health care provider approves, join an exercise class that combines movement and stress reduction. Examples include yoga and tai chi. Get enough sleep. Lack of sleep may make pain worse. Lower stress as much as possible. Practice stress reduction techniques as told by your therapist. General instructions Work with all your pain management providers to find the treatments that work best for you. You are an important member of your pain management team. There are many things you can do to reduce pain on your own. Consider joining an online or in-person support group for people who have chronic pain. Keep all follow-up visits. This is important. Where to find more information You can find more information about managing pain without opioids from: American Academy of Pain Medicine: painmed.org Institute for Chronic Pain: instituteforchronicpain.org American Chronic Pain  Association: theacpa.org Contact a health care provider if: You have side effects from pain medicine. Your pain gets worse or does not get better with treatments or home therapy. You are struggling with anxiety or depression. Summary Many types of pain can be managed without opioids. Chronic pain may respond better to pain management without opioids. Pain is best managed when you and a team of health care providers work together. Pain management without opioids may include non-opioid medicines, medical treatments, physical therapy, mental health therapy, and lifestyle  changes. Tell your health care providers if your pain gets worse or is not being managed well enough. This information is not intended to replace advice given to you by your health care provider. Make sure you discuss any questions you have with your health care provider. Document Revised: 07/11/2020 Document Reviewed: 07/11/2020 Elsevier Patient Education  2023 Else[employer] Inc[employer]   Conditions/risks identified: Opioid Pain Medicine Management Opioids are powerful medicines that are used to treat moderate to severe pain. When used for short periods of time, they can help you to: Sleep better. Do better in physical or occupational therapy. Feel better in the first few days after an injury. Recover from surgery. Opioids should be taken with the supervision of a trained health care provider. They should be taken for the shortest period of time possible. This is because opioids can be addictive, and the longer you take opioids, the greater your risk of addiction. This addiction can also be called opioid use disorder. What are the risks? Using opioid pain medicines for longer than 3 days increases your risk of side effects. Side effects include: Constipation. Nausea and vomiting. Breathing difficulties (respiratory depression). Drowsiness. Confusion. Opioid use disorder. Itching. Taking opioid pain medicine for a long period of time can affect your ability to do daily tasks. It also puts you at risk for: Motor vehicle crashes. Depression. Suicide. Heart attack. Overdose, which can be life-threatening. What is a pain treatment plan? A pain treatment plan is an agreement between you and your health care provider. Pain is unique to each person, and treatments vary depending on your condition. To manage your pain, you and your health care provider need to work together. To help you do this: Discuss the goals of your treatment, including how much pain you might expect to have and how you will manage  the pain. Review the risks and benefits of taking opioid medicines. Remember that a good treatment plan uses more than one approach and minimizes the chance of side effects. Be honest about the amount of medicines you take and about any drug or alcohol use. Get pain medicine prescriptions from only one health care provider. Pain can be managed with many types of alternative treatments. Ask your health care provider to refer you to one or more specialists who can help you manage pain through: Physical or occupational therapy. Counseling (cognitive behavioral therapy). Good nutrition. Biofeedback. Massage. Meditation. Non-opioid medicine. Following a gentle exercise program. How to use opioid pain medicine Taking medicine Take your pain medicine exactly as told by your health care provider. Take it only when you need it. If your pain gets less severe, you may take less than your prescribed dose if your health care provider approves. If you are not having pain, do nottake pain medicine unless your health care provider tells you to take it. If your pain is severe, do nottry to treat it yourself by taking more pills than instructed on your prescription. Contact your health care provider for  help. Write down the times when you take your pain medicine. It is easy to become confused while on pain medicine. Writing the time can help you avoid overdose. Take other over-the-counter or prescription medicines only as told by your health care provider. Keeping yourself and others safe  While you are taking opioid pain medicine: Do not drive, use machinery, or power tools. Do not sign legal documents. Do not drink alcohol. Do not take sleeping pills. Do not supervise children by yourself. Do not do activities that require climbing or being in high places. Do not go to a lake, river, ocean, spa, or swimming pool. Do not share your pain medicine with anyone. Keep pain medicine in a locked cabinet or  in a secure area where pets and children cannot reach it. Stopping your use of opioids If you have been taking opioid medicine for more than a few weeks, you may need to slowly decrease (taper) how much you take until you stop completely. Tapering your use of opioids can decrease your risk of symptoms of withdrawal, such as: Pain and cramping in the abdomen. Nausea. Sweating. Sleepiness. Restlessness. Uncontrollable shaking (tremors). Cravings for the medicine. Do not attempt to taper your use of opioids on your own. Talk with your health care provider about how to do this. Your health care provider may prescribe a step-down schedule based on how much medicine you are taking and how long you have been taking it. Getting rid of leftover pills Do not save any leftover pills. Get rid of leftover pills safely by: Taking the medicine to a prescription take-back program. This is usually offered by the county or law enforcement. Bringing them to a pharmacy that has a drug disposal container. Flushing them down the toilet. Check the label or package insert of your medicine to see whether this is safe to do. Throwing them out in the trash. Check the label or package insert of your medicine to see whether this is safe to do. If it is safe to throw it out, remove the medicine from the original container, put it into a sealable bag or container, and mix it with used coffee grounds, food scraps, dirt, or cat litter before putting it in the trash. Follow these instructions at home: Activity Do exercises as told by your health care provider. Avoid activities that make your pain worse. Return to your normal activities as told by your health care provider. Ask your health care provider what activities are safe for you. General instructions You may need to take these actions to prevent or treat constipation: Drink enough fluid to keep your urine pale yellow. Take over-the-counter or prescription  medicines. Eat foods that are high in fiber, such as beans, whole grains, and fresh fruits and vegetables. Limit foods that are high in fat and processed sugars, such as fried or sweet foods. Keep all follow-up visits. This is important. Where to find support If you have been taking opioids for a long time, you may benefit from receiving support for quitting from a local support group or counselor. Ask your health care provider for a referral to these resources in your area. Where to find more information Centers for Disease Control and Prevention (CDC): FootballExhibition.com.br [place] Food and Drug Administration (FDA): PumpkinSearch.com.ee Get help right away if: You may have taken too much of an opioid (overdosed). Common symptoms of an overdose: Your breathing is slower or more shallow than normal. You have a very slow heartbeat (pulse). You have slurred speech.  You have nausea and vomiting. Your pupils become very small. You have other potential symptoms: You are very confused. You faint or feel like you will faint. You have cold, clammy skin. You have blue lips or fingernails. You have thoughts of harming yourself or harming others. These symptoms may represent a serious problem that is an emergency. Do not wait to see if the symptoms will go away. Get medical help right away. Call your local emergency services (911 in the [place] Do not drive yourself to the hospital.  If you ever feel like you may hurt yourself or others, or have thoughts about taking your own life, get help right away. Go to your nearest emergency department or: Call your local emergency services (911 in the [place] Call [facility] (8506783612 in the [place] Call a suicide crisis helpline, such as the National Suicide Prevention Lifeline at (973)604-7252 or 988 in the [place] This is open 24 hours a day in the [place] Text the Crisis Text Line at 262-839-9936 (in the [place] Summary Opioid medicines can help you manage  moderate to severe pain for a short period of time. A pain treatment plan is an agreement between you and your health care provider. Discuss the goals of your treatment, including how much pain you might expect to have and how you will manage the pain. If you think that you or someone else may have taken too much of an opioid, get medical help right away. This information is not intended to replace advice given to you by your health care provider. Make sure you discuss any questions you have with your health care provider. Document Revised: 10/24/2020 Document Reviewed: 07/11/2020 Elsevier Patient Education  2023 [employer].   Next appointment: Follow up in one year for your annual wellness visit.   Preventive Care 98 Years and Older, Male  Preventive care refers to lifestyle choices and visits with your health care provider that can promote health and wellness. What does preventive care include? A yearly physical exam. This is also called an annual well check. Dental exams once or twice a year. Routine eye exams. Ask your health care provider how often you should have your eyes checked. Personal lifestyle choices, including: Daily care of your teeth and gums. Regular physical activity. Eating a healthy diet. Avoiding tobacco and drug use. Limiting alcohol use. Practicing safe sex. Taking low doses of aspirin every day. Taking vitamin and mineral supplements as recommended by your health care provider. What happens during an annual well check? The services and screenings done by your health care provider during your annual well check will depend on your age, overall health, lifestyle risk factors, and family history of disease. Counseling  Your health care provider may ask you questions about your: Alcohol use. Tobacco use. Drug use. Emotional well-being. Home and relationship well-being. Sexual activity. Eating habits. History of falls. Memory and ability to understand  (cognition). Work and work Astronomer. Screening  You may have the following tests or measurements: Height, weight, and BMI. Blood pressure. Lipid and cholesterol levels. These may be checked every 5 years, or more frequently if you are over 73 years old. Skin check. Lung cancer screening. You may have this screening every year starting at age 27 if you have a 30-pack-year history of smoking and currently smoke or have quit within the past 15 years. Fecal occult blood test (FOBT) of the stool. You may have this test every year starting at age 16. Flexible sigmoidoscopy or  colonoscopy. You may have a sigmoidoscopy every 5 years or a colonoscopy every 10 years starting at age 67. Prostate cancer screening. Recommendations will vary depending on your family history and other risks. Hepatitis C blood test. Hepatitis B blood test. Sexually transmitted disease (STD) testing. Diabetes screening. This is done by checking your blood sugar (glucose) after you have not eaten for a while (fasting). You may have this done every 1-3 years. Abdominal aortic aneurysm (AAA) screening. You may need this if you are a current or former smoker. Osteoporosis. You may be screened starting at age 78 if you are at high risk. Talk with your health care provider about your test results, treatment options, and if necessary, the need for more tests. Vaccines  Your health care provider may recommend certain vaccines, such as: Influenza vaccine. This is recommended every year. Tetanus, diphtheria, and acellular pertussis (Tdap, Td) vaccine. You may need a Td booster every 10 years. Zoster vaccine. You may need this after age 33. Pneumococcal 13-valent conjugate (PCV13) vaccine. One dose is recommended after age 46. Pneumococcal polysaccharide (PPSV23) vaccine. One dose is recommended after age 26. Talk to your health care provider about which screenings and vaccines you need and how often you need them. This  information is not intended to replace advice given to you by your health care provider. Make sure you discuss any questions you have with your health care provider. Document Released: 04/27/2015 Document Revised: 12/19/2015 Document Reviewed: 01/30/2015 Elsevier Interactive Patient Education  2017 [employer].  Fall Prevention in the Home Falls can cause injuries. They can happen to people of all ages. There are many things you can do to make your home safe and to help prevent falls. What can I do on the outside of my home? Regularly fix the edges of walkways and driveways and fix any cracks. Remove anything that might make you trip as you walk through a door, such as a raised step or threshold. Trim any bushes or trees on the path to your home. Use bright outdoor lighting. Clear any walking paths of anything that might make someone trip, such as rocks or tools. Regularly check to see if handrails are loose or broken. Make sure that both sides of any steps have handrails. Any raised decks and porches should have guardrails on the edges. Have any leaves, snow, or ice cleared regularly. Use sand or salt on walking paths during winter. Clean up any spills in your garage right away. This includes oil or grease spills. What can I do in the bathroom? Use night lights. Install grab bars by the toilet and in the tub and shower. Do not use towel bars as grab bars. Use non-skid mats or decals in the tub or shower. If you need to sit down in the shower, use a plastic, non-slip stool. Keep the floor dry. Clean up any water that spills on the floor as soon as it happens. Remove soap buildup in the tub or shower regularly. Attach bath mats securely with double-sided non-slip rug tape. Do not have throw rugs and other things on the floor that can make you trip. What can I do in the bedroom? Use night lights. Make sure that you have a light by your bed that is easy to reach. Do not use any sheets or  blankets that are too big for your bed. They should not hang down onto the floor. Have a firm chair that has side arms. You can use this for support while  you get dressed. Do not have throw rugs and other things on the floor that can make you trip. What can I do in the kitchen? Clean up any spills right away. Avoid walking on wet floors. Keep items that you use a lot in easy-to-reach places. If you need to reach something above you, use a strong step stool that has a grab bar. Keep electrical cords out of the way. Do not use floor polish or wax that makes floors slippery. If you must use wax, use non-skid floor wax. Do not have throw rugs and other things on the floor that can make you trip. What can I do with my stairs? Do not leave any items on the stairs. Make sure that there are handrails on both sides of the stairs and use them. Fix handrails that are broken or loose. Make sure that handrails are as long as the stairways. Check any carpeting to make sure that it is firmly attached to the stairs. Fix any carpet that is loose or worn. Avoid having throw rugs at the top or bottom of the stairs. If you do have throw rugs, attach them to the floor with carpet tape. Make sure that you have a light switch at the top of the stairs and the bottom of the stairs. If you do not have them, ask someone to add them for you. What else can I do to help prevent falls? Wear shoes that: Do not have high heels. Have rubber bottoms. Are comfortable and fit you well. Are closed at the toe. Do not wear sandals. If you use a stepladder: Make sure that it is fully opened. Do not climb a closed stepladder. Make sure that both sides of the stepladder are locked into place. Ask someone to hold it for you, if possible. Clearly mark and make sure that you can see: Any grab bars or handrails. First and last steps. Where the edge of each step is. Use tools that help you move around (mobility aids) if they are  needed. These include: Canes. Walkers. Scooters. Crutches. Turn on the lights when you go into a dark area. Replace any light bulbs as soon as they burn out. Set up your furniture so you have a clear path. Avoid moving your furniture around. If any of your floors are uneven, fix them. If there are any pets around you, be aware of where they are. Review your medicines with your doctor. Some medicines can make you feel dizzy. This can increase your chance of falling. Ask your doctor what other things that you can do to help prevent falls. This information is not intended to replace advice given to you by your health care provider. Make sure you discuss any questions you have with your health care provider. Document Released: 01/25/2009 Document Revised: 09/06/2015 Document Reviewed: 05/05/2014 Elsevier Interactive Patient Education  2017 [employer].

## 2022-08-25 ENCOUNTER — Encounter: Payer: PPO | Admitting: Physician Assistant

## 2022-08-25 DIAGNOSIS — E11621 Type 2 diabetes mellitus with foot ulcer: Secondary | ICD-10-CM | POA: Diagnosis not present

## 2022-08-25 DIAGNOSIS — L97522 Non-pressure chronic ulcer of other part of left foot with fat layer exposed: Secondary | ICD-10-CM | POA: Diagnosis not present

## 2022-08-26 DIAGNOSIS — L97522 Non-pressure chronic ulcer of other part of left foot with fat layer exposed: Secondary | ICD-10-CM | POA: Diagnosis not present

## 2022-09-01 ENCOUNTER — Ambulatory Visit: Payer: PPO | Admitting: Physician Assistant

## 2022-09-01 DIAGNOSIS — H16223 Keratoconjunctivitis sicca, not specified as Sjogren's, bilateral: Secondary | ICD-10-CM | POA: Diagnosis not present

## 2022-09-02 ENCOUNTER — Encounter: Payer: PPO | Admitting: Physician Assistant

## 2022-09-02 DIAGNOSIS — L97522 Non-pressure chronic ulcer of other part of left foot with fat layer exposed: Secondary | ICD-10-CM | POA: Diagnosis not present

## 2022-09-02 DIAGNOSIS — E11621 Type 2 diabetes mellitus with foot ulcer: Secondary | ICD-10-CM | POA: Diagnosis not present

## 2022-09-09 NOTE — Progress Notes (Signed)
Addendum to PHQ 2 to note 08/19/22;same as 09/09/22.

## 2022-09-10 DIAGNOSIS — E11621 Type 2 diabetes mellitus with foot ulcer: Secondary | ICD-10-CM | POA: Diagnosis not present

## 2022-09-11 ENCOUNTER — Encounter: Payer: PPO | Admitting: Physician Assistant

## 2022-09-11 DIAGNOSIS — L97522 Non-pressure chronic ulcer of other part of left foot with fat layer exposed: Secondary | ICD-10-CM | POA: Diagnosis not present

## 2022-09-11 DIAGNOSIS — E11621 Type 2 diabetes mellitus with foot ulcer: Secondary | ICD-10-CM | POA: Diagnosis not present

## 2022-09-11 NOTE — Addendum Note (Signed)
Addended by: Winn Jock on: 09/11/2022 09:41 AM   Modules accepted: Level of Service

## 2022-09-11 NOTE — Progress Notes (Addendum)
Subjective:   Douglas [name] is a 77 y.o. male who presents for Medicare Annual/Subsequent preventive examination  PHQ9 from 07/21/22 same on visit 08/19/2022. (Addendum 09/11/22)  Review of Systems    I connected with  Douglas [name] on 08/19/2022 by a audio enabled telemedicine application and verified that I am speaking with the correct person using two identifiers.  Patient Location: Home  Provider Location: Home Office  I discussed the limitations of evaluation and management by telemedicine. The patient expressed understanding and agreed to proceed.  Cardiac Risk Factors include: advanced age (>57men, >48 women);obesity (BMI >30kg/m2)     Objective:    Today's Vitals   08/19/22 1301 08/19/22 1302  Weight: 255 lb (115.7 kg)   PainSc:  4    Body mass index is 34.58 kg/m.     08/19/2022    1:13 PM 09/10/2021   12:47 PM 09/05/2020   11:44 AM 09/05/2019    1:24 PM 01/19/2019    7:28 PM 12/10/2018   10:55 AM 08/09/2018    3:58 PM  Advanced Directives  Does Patient Have a Medical Advance Directive? Yes Yes Yes Yes No No Yes  Type of Estate agent of [place];Living will Healthcare Power of [place];Living will Healthcare Power of [place];Living will Healthcare Power of [place];Living will   Healthcare Power of [place];Living will  Does patient want to make changes to medical advance directive?  No - Patient declined No - Patient declined No - Patient declined   No - Patient declined  Copy of Healthcare Power of Attorney in Chart? No - copy requested No - copy requested No - copy requested No - copy requested   No - copy requested    Current Medications (verified) Outpatient Encounter Medications as of 08/19/2022  Medication Sig   acetaminophen (TYLENOL) 500 MG tablet Take 500 mg by mouth every 6 (six) hours as needed.   aspirin 81 MG tablet Take 1 tablet (81 mg total) by mouth daily.   B Complex Vitamins (BL VITAMIN B COMPLEX PO) Take by mouth  daily.   calcitRIOL (ROCALTROL) 0.25 MCG capsule Take 1 capsule (0.25 mcg total) by mouth daily.   carvedilol (COREG) 6.25 MG tablet TAKE 1 TABLET(6.25 MG) BY MOUTH TWICE DAILY WITH A MEAL   Cholecalciferol (VITAMIN D3 PO) Take by mouth daily.   clotrimazole-betamethasone (LOTRISONE) cream Apply 1 application. topically 2 (two) times daily.   clove oil liquid Apply 1 application topically as needed.   cyclobenzaprine (FLEXERIL) 10 MG tablet Take 1 tablet by mouth as needed.   dapagliflozin propanediol ([name]) 10 MG TABS tablet Take 1 tablet (10 mg total) by mouth daily.   doxazosin (CARDURA) 4 MG tablet TAKE 1 TABLET(4 MG) BY MOUTH DAILY   DULoxetine (CYMBALTA) 60 MG capsule TAKE 1 CAPSULE(60 MG) BY MOUTH DAILY   esomeprazole (NEXIUM) 40 MG capsule Take 1 capsule (40 mg total) by mouth daily at 12 noon.   ferrous sulfate 325 (65 FE) MG tablet Take 1 tablet (325 mg total) by mouth daily with breakfast. Please dispense dye free formulation.   finasteride (PROSCAR) 5 MG tablet TAKE 1 TABLET(5 MG) BY MOUTH AT BEDTIME   furosemide (LASIX) 40 MG tablet TAKE 1 TABLET(40 MG) BY MOUTH DAILY   gentamicin cream (GARAMYCIN) 0.1 % Apply 1 application. topically 2 (two) times daily.   HYDROcodone-acetaminophen (NORCO/VICODIN) 5-325 MG tablet Take 1 tablet by mouth every 6 (six) hours as needed for moderate pain.   insulin degludec ([name] FLEXTOUCH)  200 UNIT/ML FlexTouch Pen Inject 40 Units into the skin daily.   Insulin Pen Needle (NOVOFINE PEN NEEDLE) 32G X 6 MM MISC 1 each by Does not apply route 2 (two) times daily.   levothyroxine (SYNTHROID) 100 MCG tablet TAKE 1 TABLET(100 MCG) BY MOUTH DAILY BEFORE BREAKFAST   loratadine (CLARITIN) 10 MG tablet Take 10 mg by mouth daily as needed for allergies.   losartan (COZAAR) 25 MG tablet TAKE 1 TABLET BY MOUTH EVERY MORNING THEN TAKE 2 TABLETS BY MOUTH EVERY EVENING   MAGNESIUM PO Take by mouth daily at 8 pm.   meloxicam (MOBIC) 15 MG tablet Take 1 tablet  (15 mg total) by mouth daily.   metFORMIN (GLUCOPHAGE-XR) 500 MG 24 hr tablet TAKE 2 TABLETS(1000 MG) BY MOUTH TWICE DAILY   mometasone (NASONEX) 50 MCG/ACT nasal spray Place 2 sprays into the nose daily.   Multiple Vitamins-Minerals (MULTIVITAMIN MEN PO) Take 1 tablet by mouth daily.   Needle, Disp, (HYPODERMIC NEEDLE 18GX1") 18G X 1" MISC Use every 14 days to draw up testosterone   Needle, Disp, (HYPODERMIC NEEDLE 25GX1") 25G X 1" MISC Use every 14 days to administer testosterone   pregabalin (LYRICA) 200 MG capsule TAKE 1 CAPSULE(200 MG) BY MOUTH TWICE DAILY   Semaglutide, 1 MG/DOSE, (OZEMPIC, 1 MG/DOSE,) 4 MG/3ML SOPN Inject 1 mg into the skin once a week.   simvastatin (ZOCOR) 40 MG tablet TAKE 1 TABLET(40 MG) BY MOUTH AT BEDTIME   sulfamethoxazole-trimethoprim (BACTRIM DS) 800-160 MG tablet Take 1 tablet by mouth 2 (two) times daily.   tadalafil (CIALIS) 20 MG tablet Take by mouth.   testosterone cypionate (DEPOTESTOSTERONE CYPIONATE) 200 MG/ML injection Inject 200 mg into the muscle every 14 (fourteen) days.   No facility-administered encounter medications on file as of 08/19/2022.    Allergies (verified) Bee venom, Dye fdc red [red dye], Omeprazole, Atenolol, Benadryl [diphenhydramine hcl (sleep)], Enalapril, Gabapentin, and Hctz [hydrochlorothiazide]   History: Past Medical History:  Diagnosis Date   Arthritis    Depression    Diabetes mellitus with complication (HCC)    Diabetic neuropathy (HCC)    Diastolic dysfunction    a. TTE 07/2017: EF 60-65%, mild concentric LVH, no RWMA, Gr1DD, trivial AI, mildly dilated LA, RVSF normal, PASP normal   Edema    feet/legs   GERD (gastroesophageal reflux disease)    Gout    History of stress test    a. MV 06/2017: small in size, mild in severity, apical anterior and apical defect that was minimally reversible and most likely represented artifact and less likely ischemia/scar, LVEF 55-65%, low risk, probably normal stress test    Hypertension    Hypothyroidism    Kidney cysts    renal failure 2013   Kidney stones    Dr. Artis Flock   OSA (obstructive sleep apnea)    supplemental oxygen at night   Oxygen dependent    hs   [facility] spotted fever 12/10/2017   Positve IgG in titer on 11/20/17   S/P cardiac cath 1998   a. no obstructive disease   Syncope and collapse    Temporary low platelet count (HCC)    Past Surgical History:  Procedure Laterality Date   ANAL FISSURE REPAIR     BACK SURGERY  1977   rupture disc lumbar spine   CARDIAC CATHETERIZATION  1998   [facility] with [facility].    CATARACT EXTRACTION W/PHACO Right 07/16/2016   Procedure: CATARACT EXTRACTION PHACO AND INTRAOCULAR LENS  PLACEMENT (IOC);  Surgeon: Sallee Lange, MD;  Location: [facility] ORS;  Service: Ophthalmology;  Laterality: Right;  [place] 01:11 AP% 17.6 CDE 24.83 fluid pack lot # [id number]   CATARACT EXTRACTION W/PHACO Left 08/13/2016   Procedure: CATARACT EXTRACTION PHACO AND INTRAOCULAR LENS PLACEMENT (IOC) suture placed in left eye at end of procedure;  Surgeon: Sallee Lange, MD;  Location: [facility] ORS;  Service: Ophthalmology;  Laterality: Left;  [place] 01:55 AP% 22.7 CDE 53.56 fluid pack lot # [id number]   FINGER AMPUTATION     partial   KNEE SURGERY  1993   arthroscopy   SHOULDER SURGERY Bilateral 1998   arthroscopic right, rotator cuff repair left   Family History  Problem Relation Age of Onset   Breast cancer Mother    Lung cancer Mother    Bone cancer Mother    Heart Problems Mother    Cancer Mother        breast, lung and rib   AAA (abdominal aortic aneurysm) Mother    Heart disease Mother    Heart attack Father    Heart disease Father    Cancer Brother        esophageal   Heart attack Brother    Colon cancer Neg Hx    Social History   Socioeconomic History   Marital status: Married    Spouse name: [place]   Number of children: 3   Years of education: 13   Highest education level: Not  on file  Occupational History    Comment: retired  Tobacco Use   Smoking status: Never   Smokeless tobacco: Never  Vaping Use   Vaping Use: Never used  Substance and Sexual Activity   Alcohol use: No   Drug use: No   Sexual activity: Yes  Other Topics Concern   Not on file  Social History Narrative   Lives in [place] with his wife ([place]). No children. Three step children. No pets.      Work - Patient is retired. Maintenance supervisor.      School - One year college education.      Right handed.      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               Social Determinants of Health   Financial Resource Strain: Low Risk  (08/19/2022)   Overall Financial Resource Strain (CARDIA)    Difficulty of Paying Living Expenses: Not hard at all  Food Insecurity: No Food Insecurity (08/19/2022)   Hunger Vital Sign    Worried About Running Out of Food in the Last Year: Never true    Ran Out of Food in the Last Year: Never true  Transportation Needs: No Transportation Needs (08/19/2022)   PRAPARE - Administrator, Civil Service (Medical): No    Lack of Transportation (Non-Medical): No  Physical Activity: Insufficiently Active (08/19/2022)   Exercise Vital Sign    Days of Exercise per Week: 7 days    Minutes of Exercise per Session: 20 min  Stress: No Stress Concern Present (08/19/2022)   [employer] of Occupational Health - Occupational Stress Questionnaire    Feeling of Stress : Not at all  Social Connections: Socially Integrated (08/19/2022)   Social Connection and Isolation Panel [NHANES]    Frequency of Communication with Friends and Family: More than three times a week    Frequency of Social Gatherings with Friends and Family: More than three times a week  Attends Religious Services: More than 4 times per year    Active Member of Clubs or Organizations: Yes    Attends Engineer, structural: More than 4 times per year    Marital  Status: Married    Tobacco Counseling Counseling given: Yes   Clinical Intake:  Pre-visit preparation completed: Yes  Pain : 0-10 Pain Score: 4  Pain Location: Foot Pain Orientation: Right, Left Pain Descriptors / Indicators: Aching Pain Onset: 1 to 4 weeks ago Pain Frequency: Several days a week     BMI - recorded: 34.58 Nutritional Status: BMI > 30  Obese Nutritional Risks: Non-healing wound (left foot) Diabetes: Yes CBG done?: No Did pt. bring in CBG monitor from home?: No  How often do you need to have someone help you when you read instructions, pamphlets, or other written materials from your doctor or pharmacy?: 1 - Never  Diabetic?Yes  Interpreter Needed?: No  Information entered by :: Fredirick Maudlin   Activities of Daily Living    08/19/2022    1:16 PM  In your present state of health, do you have any difficulty performing the following activities:  Hearing? 0  Vision? 0  Difficulty concentrating or making decisions? 0  Walking or climbing stairs? 0  Dressing or bathing? 0  Doing errands, shopping? 0  Preparing Food and eating ? N  Using the Toilet? N  In the past six months, have you accidently leaked urine? N  Do you have problems with loss of bowel control? N  Managing your Medications? N  Managing your Finances? N  Housekeeping or managing your Housekeeping? N    Patient Care Team: Glori Luis, MD as PCP - General (Family Medicine) Iran Ouch, MD as PCP - Cardiology (Cardiology) Mady Haagensen, MD (Internal Medicine) Tera Partridge, PA as Physician Assistant (Physician Assistant)  Indicate any recent Medical Services you may have received from other than [facility] providers in the past year (date may be approximate).     Assessment:   This is a routine wellness examination for Chrystopher.  Hearing/Vision screen Hearing Screening - Comments:: Denies hearing difficulties   Vision Screening - Comments:: Wears rx glasses - up to  date with routine eye exams with  [facility]  Dietary issues and exercise activities discussed: Current Exercise Habits: Home exercise routine, Type of exercise: walking;Other - see comments (yard work and walks dog), Time (Minutes): 60, Frequency (Times/Week): 5, Weekly Exercise (Minutes/Week): 300, Exercise limited by: neurologic condition(s)   Goals Addressed             This Visit's Progress    Maintain Healthy Lifestyle   On track    Walk as tolerated. Healthy diet.      Depression Screen    09/09/2022    5:00 PM 09/09/2022    4:59 PM 07/21/2022   12:16 PM 03/05/2022   11:00 AM 11/18/2021   11:48 AM 09/10/2021   12:45 PM 08/12/2021   11:35 AM  PHQ 2/9 Scores  PHQ - 2 Score 0 0 0 0 0 0 0  PHQ- 9 Score 0 0 0        Fall Risk    08/19/2022    1:16 PM 07/21/2022   12:16 PM 03/05/2022   11:00 AM 11/18/2021   11:48 AM 09/10/2021   12:48 PM  Fall Risk   Falls in the past year? 0 0 0 0 0  Number falls in past yr: 0 0 0 0 0  Injury with Fall? 0 0 0 0   Risk for fall due to : No Fall Risks No Fall Risks No Fall Risks No Fall Risks   Follow up Falls prevention discussed;Falls evaluation completed Falls evaluation completed Falls evaluation completed Falls evaluation completed Falls evaluation completed    FALL RISK PREVENTION PERTAINING TO THE HOME:  Any stairs in or around the home? Yes  If so, are there any without handrails? No  Home free of loose throw rugs in walkways, pet beds, electrical cords, etc? No  Adequate lighting in your home to reduce risk of falls? No   ASSISTIVE DEVICES UTILIZED TO PREVENT FALLS:  Life alert? No  Use of a cane, walker or w/c? No  Grab bars in the bathroom? Yes  Shower chair or bench in shower? Yes  Elevated toilet seat or a handicapped toilet? No   TIMED UP AND GO:  Was the test performed? No . Televisit   Cognitive Function:    07/03/2017   10:54 AM 07/02/2016   10:41 AM 07/03/2015   11:01 AM  MMSE - Mini Mental State  Exam  Orientation to time 5 5 5   Orientation to Place 5 5 5   Registration 3 3 3   Attention/ Calculation 5 5 5   Recall 3 3 3   Language- name 2 objects 2 2 2   Language- repeat 1 1 1   Language- follow 3 step command 3 3 3   Language- read & follow direction 1 1 1   Write a sentence 1 1 1   Copy design 1 1 1   Total score 30 30 30         08/19/2022    1:09 PM 09/10/2021   12:52 PM 09/05/2019    1:30 PM 08/09/2018    4:19 PM  6CIT Screen  What Year? 0 points 0 points 0 points 0 points  What month? 0 points 0 points 0 points 0 points  What time? 0 points 0 points  0 points  Count back from 20 0 points 0 points  0 points  Months in reverse 0 points 0 points 0 points 0 points  Repeat phrase 0 points 0 points  0 points  Total Score 0 points 0 points  0 points    Immunizations Immunization History  Administered Date(s) Administered   Fluad Quad(high Dose 65+) 04/21/2019, 03/02/2020, 12/19/2020, 03/05/2022   Influenza, High Dose Seasonal PF 02/15/2016, 02/16/2017, 03/01/2018   Influenza,inj,Quad PF,6+ Mos 01/04/2013, 02/20/2014, 02/27/2015   Moderna Sars-Covid-2 Vaccination 10/10/2019, 11/07/2019   Pneumococcal Conjugate-13 05/02/2013   Pneumococcal Polysaccharide-23 01/05/2011   Tdap 01/05/2008    TDAP status: Due, Education has been provided regarding the importance of this vaccine. Advised may receive this vaccine at local pharmacy or Health Dept. Aware to provide a copy of the vaccination record if obtained from local pharmacy or Health Dept. Verbalized acceptance and understanding.  Flu Vaccine status: Up to date  Pneumococcal vaccine status: Due, Education has been provided regarding the importance of this vaccine. Advised may receive this vaccine at local pharmacy or Health Dept. Aware to provide a copy of the vaccination record if obtained from local pharmacy or Health Dept. Verbalized acceptance and understanding.  Covid-19 vaccine status: Information provided on how to obtain  vaccines.   Qualifies for Shingles Vaccine? Yes   Zostavax completed No   Shingrix Completed?: No.    Education has been provided regarding the importance of this vaccine. Patient has been advised to call insurance company to determine out of pocket expense if  they have not yet received this vaccine. Advised may also receive vaccine at local pharmacy or Health Dept. Verbalized acceptance and understanding.  Screening Tests Health Maintenance  Topic Date Due   Zoster Vaccines- Shingrix (1 of 2) Never done   DTaP/Tdap/Td (2 - Td or Tdap) 01/04/2018   OPHTHALMOLOGY EXAM  03/12/2021   FOOT EXAM  08/13/2022   HEMOGLOBIN A1C  09/03/2022   INFLUENZA VACCINE  11/13/2022   Diabetic kidney evaluation - eGFR measurement  03/06/2023   Diabetic kidney evaluation - Urine ACR  03/06/2023   Medicare Annual Wellness (AWV)  08/19/2023   Pneumonia Vaccine 75+ Years old  Completed   Hepatitis C Screening  Completed   HPV VACCINES  Aged Out   COVID-19 Vaccine  Discontinued    Health Maintenance  Health Maintenance Due  Topic Date Due   Zoster Vaccines- Shingrix (1 of 2) Never done   DTaP/Tdap/Td (2 - Td or Tdap) 01/04/2018   OPHTHALMOLOGY EXAM  03/12/2021   FOOT EXAM  08/13/2022   HEMOGLOBIN A1C  09/03/2022    Colorectal cancer screening: Type of screening: Colonoscopy. Completed 08/22/15. Repeat every 10 years  Lung Cancer Screening: (Low Dose CT Chest recommended if Age 93-80 years, 30 pack-year currently smoking OR have quit w/in 15years.) does not qualify.     Additional Screening:  Hepatitis C Screening: does qualify; Completed 06/08/15  Vision Screening: Recommended annual ophthalmology exams for early detection of glaucoma and other disorders of the eye. Is the patient up to date with their annual eye exam?  Yes  Who is the provider or what is the name of the office in which the patient attends annual eye exams? [facility] If pt is not established with a provider, would they  like to be referred to a provider to establish care? No .   Dental Screening: Recommended annual dental exams for proper oral hygiene  Community Resource Referral / Chronic Care Management: CRR required this visit?  No   CCM required this visit?  No      Plan:     I have personally reviewed and noted the following in the patient's chart:   Medical and social history Use of alcohol, tobacco or illicit drugs  Current medications and supplements including opioid prescriptions. Patient is currently taking opioid prescriptions. Information provided to patient regarding non-opioid alternatives. Patient advised to discuss non-opioid treatment plan with their provider. Functional ability and status Nutritional status Physical activity Advanced directives List of other physicians Hospitalizations, surgeries, and ER visits in previous 12 months Vitals Screenings to include cognitive, depression, and falls Referrals and appointments  In addition, I have reviewed and discussed with patient certain preventive protocols, quality metrics, and best practice recommendations. A written personalized care plan for preventive services as well as general preventive health recommendations were provided to patient.     Annabell Sabal, CMA   08/19/2022   Nurse Notes: None   I have reviewed the above information and agree with above.   Duncan Dull, MD

## 2022-09-18 ENCOUNTER — Encounter: Payer: PPO | Attending: Physician Assistant | Admitting: Physician Assistant

## 2022-09-18 DIAGNOSIS — L97522 Non-pressure chronic ulcer of other part of left foot with fat layer exposed: Secondary | ICD-10-CM | POA: Insufficient documentation

## 2022-09-18 DIAGNOSIS — I10 Essential (primary) hypertension: Secondary | ICD-10-CM | POA: Insufficient documentation

## 2022-09-18 DIAGNOSIS — E1140 Type 2 diabetes mellitus with diabetic neuropathy, unspecified: Secondary | ICD-10-CM | POA: Diagnosis not present

## 2022-09-18 DIAGNOSIS — E11621 Type 2 diabetes mellitus with foot ulcer: Secondary | ICD-10-CM | POA: Insufficient documentation

## 2022-09-20 ENCOUNTER — Other Ambulatory Visit: Payer: Self-pay | Admitting: Family Medicine

## 2022-09-20 DIAGNOSIS — I10 Essential (primary) hypertension: Secondary | ICD-10-CM

## 2022-09-25 ENCOUNTER — Other Ambulatory Visit: Payer: Self-pay | Admitting: Family Medicine

## 2022-09-25 ENCOUNTER — Encounter: Payer: PPO | Admitting: Physician Assistant

## 2022-09-25 DIAGNOSIS — E11621 Type 2 diabetes mellitus with foot ulcer: Secondary | ICD-10-CM | POA: Diagnosis not present

## 2022-09-25 DIAGNOSIS — L97522 Non-pressure chronic ulcer of other part of left foot with fat layer exposed: Secondary | ICD-10-CM | POA: Diagnosis not present

## 2022-09-26 NOTE — Progress Notes (Signed)
HEMI, SCHULEN[place]G ([id number]) [id number] Page 1 of 8 Visit Report for 09/25/2022 Chief Complaint Document Details Patient Name: Date of Service: [name], [name] 09/25/2022 8:45 A M Medical Record Number: [id number] Patient Account Number: [id number] Date of Birth/Sex: Treating RN: Dec 06, 1945 (77 y.o. Douglas [name]) Yevonne Pax Primary Care Provider: [name] Other Clinician: Referring Provider: Treating Provider/Extender: [name] in Treatment: 9 Information Obtained from: Patient Chief Complaint Left great toe ulcer Electronic Signature(s) Signed: 09/25/2022 9:00:59 AM By: Allen Derry PA-C Entered By: Allen Derry on 09/25/2022 09:00:59 -------------------------------------------------------------------------------- Debridement Details Patient Name: Date of Service: Douglas Lime RD L. 09/25/2022 8:45 A M Medical Record Number: [id number] Patient Account Number: [id number] Date of Birth/Sex: Treating RN: 16-Dec-1945 (77 y.o. Douglas [name]) Yevonne Pax Primary Care Provider: [name] Other Clinician: Referring Provider: Treating Provider/Extender: [name] in Treatment: 9 Debridement Performed for Assessment: Wound #1 Left,Circumferential T Great oe Performed By: Physician Allen Derry, PA-C Debridement Type: Debridement Severity of Tissue Pre Debridement: Fat layer exposed Level of Consciousness (Pre-procedure): Awake and Alert Pre-procedure Verification/Time Out Yes - 09:00 Taken: Start Time: 09:00 Percent of Wound Bed Debrided: 100% T Area Debrided (cm): otal 5.01 Tissue and other material debrided: Viable, Non-Viable, Callus, Slough, Subcutaneous, Slough Level: Skin/Subcutaneous Tissue Debridement Description: Excisional Instrument: Curette Bleeding: Minimum Hemostasis Achieved: Pressure End Time: 09:06 Procedural Pain: 0 Post Procedural Pain: 0 Douglas [name], Douglas [name] ([id number])  [id number] Page 2 of 8 Response to Treatment: Procedure was tolerated well Level of Consciousness (Post- Awake and Alert procedure): Post Debridement Measurements of Total Wound Length: (cm) 2.9 Width: (cm) 2.2 Depth: (cm) 0.1 Volume: (cm) 0.501 Character of Wound/Ulcer Post Debridement: Improved Severity of Tissue Post Debridement: Fat layer exposed Post Procedure Diagnosis Same as Pre-procedure Electronic Signature(s) Signed: 09/25/2022 4:13:25 PM By: Yevonne Pax RN Signed: 09/25/2022 5:43:48 PM By: Allen Derry PA-C Entered By: Yevonne Pax on 09/25/2022 09:04:20 -------------------------------------------------------------------------------- HPI Details Patient Name: Date of Service: Douglas Lime RD L. 09/25/2022 8:45 A M Medical Record Number: [id number] Patient Account Number: [id number] Date of Birth/Sex: Treating RN: [birth year] (77 y.o. Douglas [name] Primary Care Provider: [name] Other Clinician: Referring Provider: Treating Provider/Extender: [name] in Treatment: 9 History of Present Illness HPI Description: 07-24-2022 upon evaluation today patient presents for initial inspection here in our clinic concerning issues that has been having with a wound on his left great toe. This is actually a circumferential issue which was precipitated by the fact that he has neuropathy. Subsequently he was trying to do what he could do to help alleviate some of his discomfort and what he noted was that pressure to the toe actually helped relieve some of the neuropathic pain. So we decided he was going to wrap his toe with tape and it felt good so he decided to leave it through the night. Unfortunately this was too much compression that actually cut off the blood flow to the toe and because a degloving injury to the toe pretty much from the base to the tip of the great toe. Unfortunately this has not completely healed yet  but fortunately I do not think it is likely going to end up is something that he will lose but nonetheless this is still questionable at this point. The patient states that he knows he should have done this and is hopefully learned his lesson he tells me that he has not been more careful and not do this type of thing in the future. Patient  does have a history of diabetes mellitus type 2, diabetic neuropathy related to the diabetes type 2, hypertension, and the neuropathy in his case is fairly severe. His most recent hemoglobin A1c was 6.5 that was on March 05, 2022 he does take a 325 mg aspirin. 07-31-2022 upon evaluation today patient appears to be doing well currently in regard to his wound. He has been tolerating the dressing changes without complication and in general I feel like that he is actually making some really good progress here but at the same time draining quite significantly. I think we need to do something to improve the drainage aspect here he is in agreement with that plan. 08-08-2022 upon evaluation today patient appears to be doing well currently in regard to his toe ulcer. He has been tolerating the dressing changes without complication. Fortunately there does not appear to be any signs of active infection locally or systemically which is great news. No fevers, chills, nausea, vomiting, or diarrhea. 08-18-2022 upon evaluation today patient appears to be doing well currently in regard to his big toe. He is going require some debridement today but in general seems to be making some good progress. Fortunately I do not see any signs of active infection locally or systemically. 08-25-2022 upon evaluation today patient appears to be doing well currently in regard to his wound. He is actually making good progress in regard to the toe without any signs of infection he seems to be really doing quite well. Fortunately I do not see any evidence of active infection locally nor systemically  which is great news. No fevers, chills, nausea, vomiting, or diarrhea. 09-02-2022 upon evaluation today patient's toe actually appears to be making excellent progress. Fortunately there does not appear to be any signs of infection locally nor systemically which is great news and overall I am extremely pleased with where we stand today. 09-11-2022 upon evaluation today patient appears to be doing well currently in regard to his wound. Has been tolerating the dressing changes without complication and fortunately I do believe that we are moving in the right direction. I do not see any evidence of overall worsening and I think that he is actually making good improvement the measurements are smaller and we are improving to the point that I am extremely happy with the progress today. AWAD, HOGLUND ([id number]) [id number] Page 3 of 8 09-18-2022 upon evaluation today patient appears to be doing well currently in regard to his wound. He is actually tolerating the dressing changes without complication. Fortunately there does not appear to be any evidence of active infection locally or systemically which is great news. No fevers, chills, nausea, vomiting, or diarrhea. 09-25-2022 upon evaluation today patient appears to be doing well currently in regard to his wound. He has been tolerating the dressing changes without complication and is making really good progress here. Fortunately I do not see any evidence of active infection locally nor systemically which is great news. No fevers, chills, nausea, vomiting, or diarrhea. Electronic Signature(s) Signed: 09/25/2022 9:09:58 AM By: Allen Derry PA-C Entered By: Allen Derry on 09/25/2022 09:09:58 -------------------------------------------------------------------------------- Physical Exam Details Patient Name: Date of Service: Douglas Lime RD L. 09/25/2022 8:45 A M Medical Record Number: [id number] Patient Account Number:  [id number] Date of Birth/Sex: Treating RN: 16-Aug-1945 (77 y.o. Douglas [name] Primary Care Provider: [name] Other Clinician: Referring Provider: Treating Provider/Extender: [name] in Treatment: 9 Constitutional Well-nourished and well-hydrated in no acute distress. Respiratory normal breathing without difficulty.  Psychiatric this patient is able to make decisions and demonstrates good insight into disease process. Alert and Oriented x 3. pleasant and cooperative. Notes Upon inspection patient's wound bed actually showed signs of good granulation and epithelization at this point. Fortunately I do not see any evidence of worsening overall and I do believe that the patient is making excellent progress. In fact perform debridement today postdebridement this looks to be doing very well. Electronic Signature(s) Signed: 09/25/2022 9:10:19 AM By: Allen Derry PA-C Entered By: Allen Derry on 09/25/2022 09:10:19 -------------------------------------------------------------------------------- Physician Orders Details Patient Name: Date of Service: Douglas Lime RD L. 09/25/2022 8:45 A M Medical Record Number: [id number] Patient Account Number: [id number] Date of Birth/Sex: Treating RN: [birth year] (77 y.o. Douglas [name]) Yevonne Pax Primary Care Provider: [name] Other Clinician: Referring Provider: Treating Provider/Extender: [name] in Treatment: [address] ([id number]) [phone].pdf Page 4 of 8 Verbal / Phone Orders: No Diagnosis Coding ICD-10 Coding Code Description E11.621 Type 2 diabetes mellitus with foot ulcer L97.522 Non-pressure chronic ulcer of other part of left foot with fat layer exposed E11.40 Type 2 diabetes mellitus with diabetic neuropathy, unspecified I10 Essential (primary) hypertension Follow-up Appointments Return Appointment in 1 week. Bathing/ [employer]  wounds with antibacterial soap and water. Off-Loading Open toe surgical shoe Wound Treatment Wound #1 - T Great oe Wound Laterality: Left, Circumferential Cleanser: Byram Ancillary Kit - 15 Day Supply (DME) (Generic) 1 x Per Day/15 Days Discharge Instructions: Use supplies as instructed; Kit contains: (15) Saline Bullets; (15) 3x3 Gauze; 15 pr Gloves Cleanser: Soap and Water 1 x Per Day/15 Days Discharge Instructions: Gently cleanse wound with antibacterial soap, rinse and pat dry prior to dressing wounds Prim Dressing: Silvercel Small 2x2 (in/in) (DME) (Generic) 1 x Per Day/15 Days ary Discharge Instructions: Apply Silvercel Small 2x2 (in/in) as instructed Secondary Dressing: Gauze (DME) (Generic) 1 x Per Day/15 Days Secured With: Medipore T - 36M Medipore H Soft Cloth Surgical T ape ape, 2x2 (in/yd) (DME) (Generic) 1 x Per Day/15 Days Electronic Signature(s) Signed: 09/25/2022 4:13:25 PM By: Yevonne Pax RN Signed: 09/25/2022 5:43:48 PM By: Allen Derry PA-C Entered By: Yevonne Pax on 09/25/2022 09:10:18 -------------------------------------------------------------------------------- Problem List Details Patient Name: Date of Service: Douglas Lime RD L. 09/25/2022 8:45 A M Medical Record Number: [id number] Patient Account Number: [id number] Date of Birth/Sex: Treating RN: 07-24-1945 (77 y.o. Douglas [name] Primary Care Provider: [name] Other Clinician: Referring Provider: Treating Provider/Extender: [name] in Treatment: 9 Active Problems ICD-10 Encounter Code Description Active Date MDM Diagnosis E11.621 Type 2 diabetes mellitus with foot ulcer 07/24/2022 No Yes Douglas [name], Douglas [name] ([id number]) [id number] Page 5 of 8 [phone] Non-pressure chronic ulcer of other part of left foot with fat layer exposed 07/24/2022 No Yes E11.40 Type 2 diabetes mellitus with diabetic neuropathy, unspecified 07/24/2022 No Yes I10  Essential (primary) hypertension 07/24/2022 No Yes Inactive Problems Resolved Problems Electronic Signature(s) Signed: 09/25/2022 9:00:51 AM By: Allen Derry PA-C Entered By: Allen Derry on 09/25/2022 09:00:51 -------------------------------------------------------------------------------- Progress Note Details Patient Name: Date of Service: Douglas Lime RD L. 09/25/2022 8:45 A M Medical Record Number: [id number] Patient Account Number: [id number] Date of Birth/Sex: Treating RN: Aug 12, 1945 (77 y.o. Douglas [name] Primary Care Provider: [name] Other Clinician: Referring Provider: Treating Provider/Extender: [name] in Treatment: 9 Subjective Chief Complaint Information obtained from Patient Left great toe ulcer History of Present Illness (HPI) 07-24-2022 upon evaluation today patient presents for initial inspection here in our  clinic concerning issues that has been having with a wound on his left great toe. This is actually a circumferential issue which was precipitated by the fact that he has neuropathy. Subsequently he was trying to do what he could do to help alleviate some of his discomfort and what he noted was that pressure to the toe actually helped relieve some of the neuropathic pain. So we decided he was going to wrap his toe with tape and it felt good so he decided to leave it through the night. Unfortunately this was too much compression that actually cut off the blood flow to the toe and because a degloving injury to the toe pretty much from the base to the tip of the great toe. Unfortunately this has not completely healed yet but fortunately I do not think it is likely going to end up is something that he will lose but nonetheless this is still questionable at this point. The patient states that he knows he should have done this and is hopefully learned his lesson he tells me that he has not been more careful and not do this type of thing  in the future. Patient does have a history of diabetes mellitus type 2, diabetic neuropathy related to the diabetes type 2, hypertension, and the neuropathy in his case is fairly severe. His most recent hemoglobin A1c was 6.5 that was on March 05, 2022 he does take a 325 mg aspirin. 07-31-2022 upon evaluation today patient appears to be doing well currently in regard to his wound. He has been tolerating the dressing changes without complication and in general I feel like that he is actually making some really good progress here but at the same time draining quite significantly. I think we need to do something to improve the drainage aspect here he is in agreement with that plan. 08-08-2022 upon evaluation today patient appears to be doing well currently in regard to his toe ulcer. He has been tolerating the dressing changes without complication. Fortunately there does not appear to be any signs of active infection locally or systemically which is great news. No fevers, chills, nausea, vomiting, or diarrhea. 08-18-2022 upon evaluation today patient appears to be doing well currently in regard to his big toe. He is going require some debridement today but in general seems to be making some good progress. Fortunately I do not see any signs of active infection locally or systemically. 08-25-2022 upon evaluation today patient appears to be doing well currently in regard to his wound. He is actually making good progress in regard to the toe without any signs of infection he seems to be really doing quite well. Fortunately I do not see any evidence of active infection locally nor systemically which is great news. No fevers, chills, nausea, vomiting, or diarrhea. 09-02-2022 upon evaluation today patient's toe actually appears to be making excellent progress. Fortunately there does not appear to be any signs of infection locally nor systemically which is great news and overall I am extremely pleased with where we  stand today. Douglas [name], Douglas [name] [id number] Page 6 of 8 09-11-2022 upon evaluation today patient appears to be doing well currently in regard to his wound. Has been tolerating the dressing changes without complication and fortunately I do believe that we are moving in the right direction. I do not see any evidence of overall worsening and I think that he is actually making good improvement the measurements are smaller and we are improving to the point that  I am extremely happy with the progress today. 09-18-2022 upon evaluation today patient appears to be doing well currently in regard to his wound. He is actually tolerating the dressing changes without complication. Fortunately there does not appear to be any evidence of active infection locally or systemically which is great news. No fevers, chills, nausea, vomiting, or diarrhea. 09-25-2022 upon evaluation today patient appears to be doing well currently in regard to his wound. He has been tolerating the dressing changes without complication and is making really good progress here. Fortunately I do not see any evidence of active infection locally nor systemically which is great news. No fevers, chills, nausea, vomiting, or diarrhea. Objective Constitutional Well-nourished and well-hydrated in no acute distress. Vitals Time Taken: 8:45 AM, Height: 72 in, Weight: 253 lbs, BMI: 34.3, Temperature: 97.9 F, Pulse: 74 bpm, Respiratory Rate: 18 breaths/min, Blood Pressure: 181/92 mmHg. Respiratory normal breathing without difficulty. Psychiatric this patient is able to make decisions and demonstrates good insight into disease process. Alert and Oriented x 3. pleasant and cooperative. General Notes: Upon inspection patient's wound bed actually showed signs of good granulation and epithelization at this point. Fortunately I do not see any evidence of worsening overall and I do believe that the patient is making  excellent progress. In fact perform debridement today postdebridement this looks to be doing very well. Integumentary (Hair, Skin) Wound #1 status is Open. Original cause of wound was Shear/Friction. The date acquired was: 07/17/2022. The wound has been in treatment 9 weeks. The wound is located on the Left,Circumferential T Great. The wound measures 2.9cm length x 2.2cm width x 0.1cm depth; 5.011cm^2 area and 0.501cm^3 volume. oe There is Fat Layer (Subcutaneous Tissue) exposed. There is no tunneling or undermining noted. There is a medium amount of serosanguineous drainage noted. The wound margin is flat and intact. There is medium (34-66%) red, pink granulation within the wound bed. There is a small (1-33%) amount of necrotic tissue within the wound bed including Adherent Slough. Assessment Active Problems ICD-10 Type 2 diabetes mellitus with foot ulcer Non-pressure chronic ulcer of other part of left foot with fat layer exposed Type 2 diabetes mellitus with diabetic neuropathy, unspecified Essential (primary) hypertension Procedures Wound #1 Pre-procedure diagnosis of Wound #1 is a Diabetic Wound/Ulcer of the Lower Extremity located on the Left,Circumferential T Great .Severity of Tissue Pre oe Debridement is: Fat layer exposed. There was a Excisional Skin/Subcutaneous Tissue Debridement with a total area of 5.01 sq cm performed by Allen Derry, PA-C. With the following instrument(s): Curette to remove Viable and Non-Viable tissue/material. Material removed includes Callus, Subcutaneous Tissue, and Slough. A time out was conducted at 09:00, prior to the start of the procedure. A Minimum amount of bleeding was controlled with Pressure. The procedure was tolerated well with a pain level of 0 throughout and a pain level of 0 following the procedure. Post Debridement Measurements: 2.9cm length x 2.2cm width x 0.1cm depth; 0.501cm^3 volume. Character of Wound/Ulcer Post Debridement is improved.  Severity of Tissue Post Debridement is: Fat layer exposed. Post procedure Diagnosis Wound #1: Same as Pre-Procedure Plan Follow-up Appointments: Return Appointment in 1 week. Douglas [name], Douglas [name] [id number] Page 7 of 8 Bathing/ Shower/ Hygiene: Wash wounds with antibacterial soap and water. Off-Loading: Open toe surgical shoe WOUND #1: - T Great Wound Laterality: Left, Circumferential oe Cleanser: [employer] Ancillary Kit - 15 Day Supply (DME) (Generic) 1 x Per Day/15 Days Discharge Instructions: Use supplies as instructed; Kit contains: (15) Saline Bullets; (15) 3x3  Gauze; 15 pr Gloves Cleanser: Soap and Water 1 x Per Day/15 Days Discharge Instructions: Gently cleanse wound with antibacterial soap, rinse and pat dry prior to dressing wounds Prim Dressing: Silvercel Small 2x2 (in/in) (DME) (Generic) 1 x Per Day/15 Days ary Discharge Instructions: Apply Silvercel Small 2x2 (in/in) as instructed Secondary Dressing: Gauze (DME) (Generic) 1 x Per Day/15 Days Secured With: Medipore T - 20M Medipore H Soft Cloth Surgical T ape ape, 2x2 (in/yd) (DME) (Generic) 1 x Per Day/15 Days 1. I would recommend currently based on what we are seeing that we go ahead and continue with the silver alginate as before his wound is getting very close to complete resolution. 2. Also can recommend the patient should continue to apply this as he has been he also does take it off at nighttime to let it air out I cannot argue with the results his toe looks excellent. We will see patient back for reevaluation in 1 week here in the clinic. If anything worsens or changes patient will contact our office for additional recommendations. Electronic Signature(s) Signed: 09/25/2022 9:10:46 AM By: Allen Derry PA-C Entered By: Allen Derry on 09/25/2022 09:10:46 -------------------------------------------------------------------------------- SuperBill Details Patient Name: Date of  Service: Douglas Lime RD L. 09/25/2022 Medical Record Number: [id number] Patient Account Number: [id number] Date of Birth/Sex: Treating RN: 21-Aug-1945 (77 y.o. Douglas [name]) Yevonne Pax Primary Care Provider: [name] Other Clinician: Referring Provider: Treating Provider/Extender: [name] in Treatment: 9 Diagnosis Coding ICD-10 Codes Code Description E11.621 Type 2 diabetes mellitus with foot ulcer L97.522 Non-pressure chronic ulcer of other part of left foot with fat layer exposed E11.40 Type 2 diabetes mellitus with diabetic neuropathy, unspecified I10 Essential (primary) hypertension Facility Procedures : CPT4 Code: [id number] Description: 11042 - DEB SUBQ TISSUE 20 SQ CM/< ICD-10 Diagnosis Description L97.522 Non-pressure chronic ulcer of other part of left foot with fat layer exposed Modifier: Quantity: 1 Physician Procedures Electronic Signature(s) Signed: 09/25/2022 9:10:55 AM By: Allen Derry PA-C Entered By: Allen Derry on 09/25/2022 09:10:55

## 2022-09-27 NOTE — Progress Notes (Signed)
Douglas [name], Douglas [name] ([id number]) [id number] Page 1 of 8 Visit Report for 09/25/2022 Arrival Information Details Patient Name: Date of Service: Douglas [name], ARCILA RD L. 09/25/2022 8:45 A M Medical Record Number: [id number] Patient Account Number: [id number] Date of Birth/Sex: Treating RN: May 20, 1945 (77 y.o. Douglas [name]) Douglas [name] Primary Care Douglas [name]: [name] Other Clinician: Referring Douglas [name]: Treating Braelin Costlow/Extender: Douglas [name] in Treatment: 9 Visit Information History Since Last Visit Added or deleted any medications: No Patient Arrived: Ambulatory Any new allergies or adverse reactions: No Arrival Time: 08:44 Had a fall or experienced change in No Accompanied By: self activities of daily living that may affect Transfer Assistance: None risk of falls: Patient Identification Verified: Yes Signs or symptoms of abuse/neglect since last visito No Secondary Verification Process Completed: Yes Hospitalized since last visit: No Patient Requires Transmission-Based Precautions: No Implantable device outside of the clinic excluding No Patient Has Alerts: Yes cellular tissue based products placed in the center Patient Alerts: Diabetes type 2 since last visit: ASA 325mg  Has Dressing in Place as Prescribed: Yes Pain Present Now: No Electronic Signature(s) Signed: 09/25/2022 4:13:25 PM By: Douglas Pax RN Entered By: Douglas [name] on 09/25/2022 08:45:07 -------------------------------------------------------------------------------- Clinic Level of Care Assessment Details Patient Name: Date of Service: Douglas [name], Douglas [name] RD L. 09/25/2022 8:45 A M Medical Record Number: [id number] Patient Account Number: [id number] Date of Birth/Sex: Treating RN: November 19, 1945 (77 y.o. Douglas [name]) Douglas [name] Primary Care Amiel Mccaffrey: [name] Other Clinician: Referring Yahsir Wickens: Treating Lavanya Roa/Extender: Douglas [name] in Treatment:  9 Clinic Level of Care Assessment Items TOOL 1 Quantity Score []  - 0 Use when EandM and Procedure is performed on INITIAL visit ASSESSMENTS - Nursing Assessment / Reassessment []  - 0 General Physical Exam (combine w/ comprehensive assessment (listed just below) when performed on new pt. evals) []  - 0 Comprehensive Assessment (HX, ROS, Risk Assessments, Wounds Hx, etc.) JERMELLE, [name] ([id number]) [phone] Page 2 of 8 ASSESSMENTS - Wound and Skin Assessment / Reassessment []  - 0 Dermatologic / Skin Assessment (not related to wound area) ASSESSMENTS - Ostomy and/or Continence Assessment and Care []  - 0 Incontinence Assessment and Management []  - 0 Ostomy Care Assessment and Management (repouching, etc.) PROCESS - Coordination of Care []  - 0 Simple Patient / Family Education for ongoing care []  - 0 Complex (extensive) Patient / Family Education for ongoing care []  - 0 Staff obtains Chiropractor, Records, T Results / Process Orders est []  - 0 Staff telephones HHA, Nursing Homes / Clarify orders / etc []  - 0 Routine Transfer to another Facility (non-emergent condition) []  - 0 Routine Hospital Admission (non-emergent condition) []  - 0 New Admissions / Manufacturing engineer / Ordering NPWT Apligraf, etc. , []  - 0 Emergency Hospital Admission (emergent condition) PROCESS - Special Needs []  - 0 Pediatric / Minor Patient Management []  - 0 Isolation Patient Management []  - 0 Hearing / Language / Visual special needs []  - 0 Assessment of Community assistance (transportation, D/C planning, etc.) []  - 0 Additional assistance / Altered mentation []  - 0 Support Surface(s) Assessment (bed, cushion, seat, etc.) INTERVENTIONS - Miscellaneous []  - 0 External ear exam []  - 0 Patient Transfer (multiple staff / Nurse, adult / Similar devices) []  - 0 Simple Staple / Suture removal (25 or less) []  - 0 Complex Staple / Suture removal (26 or more) []  -  0 Hypo/Hyperglycemic Management (do not check if billed separately) []  - 0 Ankle / Brachial Index (ABI) - do not check if billed separately Has the patient  been seen at the hospital within the last three years: Yes Total Score: 0 Level Of Care: ____ Electronic Signature(s) Signed: 09/25/2022 4:13:25 PM By: Douglas Pax RN Entered By: Douglas [name] on 09/25/2022 09:04:39 -------------------------------------------------------------------------------- Encounter Discharge Information Details Patient Name: Date of Service: Douglas [name] RD L. 09/25/2022 8:45 A M Medical Record Number: [id number] Patient Account Number: [id number] Date of Birth/Sex: Treating RN: 01-08-1946 (77 y.o. Douglas [name] Primary Care Douglas [name]: [name] Other Clinician: Referring Douglas [name]: Treating Douglas [name]/Extender: Douglas [name] in Treatment: 9 Douglas [name], [place] L ([id number]) [id number] Page 3 of 8 Encounter Discharge Information Items Post Procedure Vitals Discharge Condition: Stable Temperature (F): 97.9 Ambulatory Status: Ambulatory Pulse (bpm): 74 Discharge Destination: Home Respiratory Rate (breaths/min): 18 Transportation: Private Auto Blood Pressure (mmHg): 181/92 Accompanied By: self Schedule Follow-up Appointment: Yes Clinical Summary of Care: Electronic Signature(s) Signed: 09/25/2022 4:13:25 PM By: Douglas Pax RN Entered By: Douglas [name] on 09/25/2022 09:05:22 -------------------------------------------------------------------------------- Lower Extremity Assessment Details Patient Name: Date of Service: Douglas [name] RD L. 09/25/2022 8:45 A M Medical Record Number: [id number] Patient Account Number: [id number] Date of Birth/Sex: Treating RN: 22-Sep-1945 (77 y.o. Douglas [name]) Douglas [name] Primary Care Douglas [name]: [name] Other Clinician: Referring Douglas [name]: Treating Douglas [name]/Extender: Douglas [name] in Treatment:  9 Electronic Signature(s) Signed: 09/25/2022 4:13:25 PM By: Douglas Pax RN Entered By: Douglas [name] on 09/25/2022 08:48:46 -------------------------------------------------------------------------------- Multi Wound Chart Details Patient Name: Date of Service: Douglas [name] RD L. 09/25/2022 8:45 A M Medical Record Number: [id number] Patient Account Number: [id number] Date of Birth/Sex: Treating RN: 1946/01/09 (77 y.o. Douglas [name] Primary Care Sinthia Karabin: [name] Other Clinician: Referring Najla Aughenbaugh: Treating Kamrin Sibley/Extender: Douglas [name] in Treatment: 9 Vital Signs Height(in): 72 Pulse(bpm): 74 Weight(lbs): 253 Blood Pressure(mmHg): 181/92 Body Mass Index(BMI): 34.3 Temperature(F): 97.9 Respiratory Rate(breaths/min): 18 [1:Photos:] CARBON, TREADWAY ([id number]) [1:Photos:] [N/A:N/A] Left, Circumferential T Great oe N/A N/A Wound Location: Shear/Friction N/A N/A Wounding Event: Diabetic Wound/Ulcer of the Lower N/A N/A Primary Etiology: Extremity Sleep Apnea, Hypertension, Type II N/A N/A Comorbid History: Diabetes, Neuropathy 07/17/2022 N/A N/A Date Acquired: 9 N/A N/A Weeks of Treatment: Open N/A N/A Wound Status: No N/A N/A Wound Recurrence: Yes N/A N/A Pending A mputation on Presentation: 2.9x2.2x0.1 N/A N/A Measurements L x W x D (cm) 5.011 N/A N/A A (cm) : rea 0.501 N/A N/A Volume (cm) : 90.50% N/A N/A % Reduction in A rea: 90.50% N/A N/A % Reduction in Volume: Grade 1 N/A N/A Classification: Medium N/A N/A Exudate A mount: Serosanguineous N/A N/A Exudate Type: red, brown N/A N/A Exudate Color: Flat and Intact N/A N/A Wound Margin: Medium (34-66%) N/A N/A Granulation A mount: Red, Pink N/A N/A Granulation Quality: Small (1-33%) N/A N/A Necrotic A mount: Fat Layer (Subcutaneous Tissue): Yes N/A N/A Exposed Structures: Fascia: No Tendon: No Muscle: No Joint: No Bone: No None N/A  N/A Epithelialization: Treatment Notes Electronic Signature(s) Signed: 09/25/2022 4:13:25 PM By: Douglas Pax RN Entered By: Douglas [name] on 09/25/2022 08:48:51 -------------------------------------------------------------------------------- Multi-Disciplinary Care Plan Details Patient Name: Date of Service: Douglas [name] RD L. 09/25/2022 8:45 A M Medical Record Number: [id number] Patient Account Number: [id number] Date of Birth/Sex: Treating RN: 14-Jul-1945 (77 y.o. Douglas [name]) Douglas [name] Primary Care Marchella Hibbard: [name] Other Clinician: Referring Braylynn Ghan: Treating Kache Mcclurg/Extender: Douglas [name] in Treatment: 9 Active Inactive Wound/Skin Impairment Nursing Diagnoses: GANNICUS, LUERS ([id number]) [id number] Page 5 of 8 Impaired tissue integrity Knowledge deficit related to ulceration/compromised skin integrity Goals: Patient/caregiver will verbalize understanding of skin  care regimen Date Initiated: 07/24/2022 Date Inactivated: 08/08/2022 Target Resolution Date: 08/23/2022 Goal Status: Met Ulcer/skin breakdown will have a volume reduction of 30% by week 4 Date Initiated: 07/24/2022 Date Inactivated: 08/25/2022 Target Resolution Date: 08/23/2022 Goal Status: Met Ulcer/skin breakdown will have a volume reduction of 50% by week 8 Date Initiated: 07/24/2022 Date Inactivated: 09/25/2022 Target Resolution Date: 09/23/2022 Goal Status: Met Ulcer/skin breakdown will have a volume reduction of 80% by week 12 Date Initiated: 07/24/2022 Target Resolution Date: 10/23/2022 Goal Status: Active Ulcer/skin breakdown will heal within 14 weeks Date Initiated: 07/24/2022 Target Resolution Date: 11/06/2022 Goal Status: Active Interventions: Assess patient/caregiver ability to obtain necessary supplies Assess patient/caregiver ability to perform ulcer/skin care regimen upon admission and as needed Assess ulceration(s) every visit Provide  education on ulcer and skin care Treatment Activities: Referred to DME Zakariyya Helfman for dressing supplies : 07/24/2022 Skin care regimen initiated : 07/24/2022 Notes: Electronic Signature(s) Signed: 09/25/2022 4:13:25 PM By: Douglas Pax RN Entered By: Douglas [name] on 09/25/2022 08:49:18 -------------------------------------------------------------------------------- Pain Assessment Details Patient Name: Date of Service: Douglas [name] RD L. 09/25/2022 8:45 A M Medical Record Number: [id number] Patient Account Number: [id number] Date of Birth/Sex: Treating RN: 1945/09/23 (77 y.o. Douglas [name] Primary Care Brittiany Wiehe: [name] Other Clinician: Referring Onesti Bonfiglio: Treating Raylyn Carton/Extender: Douglas [name] in Treatment: 9 Active Problems Location of Pain Severity and Description of Pain Patient Has Paino No Site Locations [place] ([id number]) [phone].pdf Page 6 of 8 Pain Management and Medication Current Pain Management: Electronic Signature(s) Signed: 09/25/2022 4:13:25 PM By: Douglas Pax RN Entered By: Douglas [name] on 09/25/2022 08:45:39 -------------------------------------------------------------------------------- Patient/Caregiver Education Details Patient Name: Date of Service: Douglas [name] RD L. 6/13/2024andnbsp8:45 A M Medical Record Number: [id number] Patient Account Number: [id number] Date of Birth/Gender: Treating RN: October 01, 1945 (77 y.o. Douglas [name] Primary Care Physician: [name] Other Clinician: Referring Physician: Treating Physician/Extender: Douglas [name] in Treatment: 9 Education Assessment Education Provided To: Patient Education Topics Provided Wound/Skin Impairment: Handouts: Caring for Your Ulcer Methods: Explain/Verbal Responses: State content correctly Electronic Signature(s) Signed: 09/25/2022 4:13:25 PM By: Douglas Pax RN Entered By: Douglas [name]  on 09/25/2022 08:49:39 [name] ([id number]) [id number] Page 7 of 8 -------------------------------------------------------------------------------- Wound Assessment Details Patient Name: Date of Service: Douglas [name], Douglas [name] RD L. 09/25/2022 8:45 A M Medical Record Number: [id number] Patient Account Number: [id number] Date of Birth/Sex: Treating RN: 02-08-1946 (77 y.o. Douglas [name]) Douglas [name] Primary Care Merced Hanners: [name] Other Clinician: Referring Kirsi Hugh: Treating Evelyna Folker/Extender: Douglas [name] in Treatment: 9 Wound Status Wound Number: 1 Primary Etiology: Diabetic Wound/Ulcer of the Lower Extremity Wound Location: Left, Circumferential T Great oe Wound Status: Open Wounding Event: Shear/Friction Comorbid History: Sleep Apnea, Hypertension, Type II Diabetes, Neuropathy Date Acquired: 07/17/2022 Weeks Of Treatment: 9 Clustered Wound: No Pending Amputation On Presentation Photos Wound Measurements Length: (cm) 2.9 Width: (cm) 2.2 Depth: (cm) 0.1 Area: (cm) 5.011 Volume: (cm) 0.501 % Reduction in Area: 90.5% % Reduction in Volume: 90.5% Epithelialization: None Tunneling: No Undermining: No Wound Description Classification: Grade 1 Wound Margin: Flat and Intact Exudate Amount: Medium Exudate Type: Serosanguineous Exudate Color: red, brown Foul Odor After Cleansing: No Slough/Fibrino Yes Wound Bed Granulation Amount: Medium (34-66%) Exposed Structure Granulation Quality: Red, Pink Fascia Exposed: No Necrotic Amount: Small (1-33%) Fat Layer (Subcutaneous Tissue) Exposed: Yes Necrotic Quality: Adherent Slough Tendon Exposed: No Muscle Exposed: No Joint Exposed: No Bone Exposed: No Treatment Notes Wound #1 (Toe Great) Wound Laterality: Left, Circumferential Cleanser [place] Ancillary Kit -  15 Day Supply Discharge Instruction: Use supplies as instructed; Kit contains: (15) Saline Bullets; (15) 3x3 Gauze; 15 pr  Gloves TYREI, VASILIOU ([id number]) [phone].pdf Page 8 of 8 Soap and Water Discharge Instruction: Gently cleanse wound with antibacterial soap, rinse and pat dry prior to dressing wounds Peri-Wound Care Topical Primary Dressing Silvercel Small 2x2 (in/in) Discharge Instruction: Apply Silvercel Small 2x2 (in/in) as instructed Secondary Dressing Gauze Secured With Medipore T - 24M Medipore H Soft Cloth Surgical T ape ape, 2x2 (in/yd) Compression Wrap Compression Stockings Add-Ons Electronic Signature(s) Signed: 09/25/2022 4:13:25 PM By: Douglas Pax RN Entered By: Douglas [name] on 09/25/2022 08:48:29 -------------------------------------------------------------------------------- Vitals Details Patient Name: Date of Service: Douglas [name] RD L. 09/25/2022 8:45 A M Medical Record Number: [id number] Patient Account Number: [id number] Date of Birth/Sex: Treating RN: 1945-09-09 (77 y.o. Douglas [name]) Douglas [name] Primary Care Caison Hearn: [name] Other Clinician: Referring Brianna Esson: Treating Danna Casella/Extender: Douglas [name] in Treatment: 9 Vital Signs Time Taken: 08:45 Temperature (F): 97.9 Height (in): 72 Pulse (bpm): 74 Weight (lbs): 253 Respiratory Rate (breaths/min): 18 Body Mass Index (BMI): 34.3 Blood Pressure (mmHg): 181/92 Reference Range: 80 - 120 mg / dl Electronic Signature(s) Signed: 09/25/2022 4:13:25 PM By: Douglas Pax RN Entered By: Douglas [name] on 09/25/2022 08:45:29

## 2022-10-02 ENCOUNTER — Encounter: Payer: PPO | Admitting: Physician Assistant

## 2022-10-02 DIAGNOSIS — E11621 Type 2 diabetes mellitus with foot ulcer: Secondary | ICD-10-CM | POA: Diagnosis not present

## 2022-10-02 DIAGNOSIS — L97522 Non-pressure chronic ulcer of other part of left foot with fat layer exposed: Secondary | ICD-10-CM | POA: Diagnosis not present

## 2022-10-02 NOTE — Progress Notes (Addendum)
Douglas [name], Douglas [name] ([id number]) [id number] Page 1 of 8 Visit Report for 10/02/2022 Chief Complaint Document Details Patient Name: Date of Service: AVONTAE, BURKHEAD RD Elbert Ewings 10/02/2022 12:45 PM Medical Record Number: [id number] Patient Account Number: [id number] Date of Birth/Sex: Treating RN: 1945-04-24 (77 y.o. [name] Primary Care Provider: [name] Other Clinician: [name] Referring Provider: Treating Provider/Extender: [name] in Treatment: 10 Information Obtained from: Patient Chief Complaint Left great toe ulcer Electronic Signature(s) Signed: 10/02/2022 1:03:49 PM By: Allen Derry PA-C Entered By: Allen Derry on 10/02/2022 13:03:48 -------------------------------------------------------------------------------- Debridement Details Patient Name: Date of Service: [name] 10/02/2022 12:45 PM Medical Record Number: [id number] Patient Account Number: [id number] Date of Birth/Sex: Treating RN: Mar 09, 1946 (77 y.o. [name] Primary Care Provider: [name] Other Clinician: [name] Referring Provider: Treating Provider/Extender: [name] in Treatment: 10 Debridement Performed for Assessment: Wound #1 Left,Circumferential T Great oe Performed By: Physician Allen Derry, PA-C Debridement Type: Debridement Severity of Tissue Pre Debridement: Fat layer exposed Level of Consciousness (Pre-procedure): Awake and Alert Pre-procedure Verification/Time Out Yes - 13:33 Taken: Start Time: 13:33 Percent of Wound Bed Debrided: 100% T Area Debrided (cm): otal 0.31 Tissue and other material debrided: Viable, Non-Viable, Callus Level: Non-Viable Tissue Debridement Description: Selective/Open Wound Instrument: Curette Bleeding: Minimum Hemostasis Achieved: Pressure Response to Treatment: Procedure was tolerated well Level of Consciousness (Post- Awake  and Alert procedure): BLAIZE, EPPLE ([id number]) [id number] Page 2 of 8 Post Debridement Measurements of Total Wound Length: (cm) 1 Width: (cm) 0.4 Depth: (cm) 0.1 Volume: (cm) 0.031 Character of Wound/Ulcer Post Debridement: Stable Severity of Tissue Post Debridement: Fat layer exposed Post Procedure Diagnosis Same as Pre-procedure Electronic Signature(s) Signed: 10/02/2022 4:16:40 PM By: Angelina Pih Signed: 10/02/2022 5:12:55 PM By: [name] Signed: 10/02/2022 5:52:14 PM By: Allen Derry PA-C Entered By: [name] on 10/02/2022 13:35:49 -------------------------------------------------------------------------------- HPI Details Patient Name: Date of Service: [name] 10/02/2022 12:45 PM Medical Record Number: [id number] Patient Account Number: [id number] Date of Birth/Sex: Treating RN: September 17, 1945 (77 y.o. [name] Primary Care Provider: [name] Other Clinician: [name] Referring Provider: Treating Provider/Extender: [name] in Treatment: 10 History of Present Illness HPI Description: 07-24-2022 upon evaluation today patient presents for initial inspection here in our clinic concerning issues that has been having with a wound on his left great toe. This is actually a circumferential issue which was precipitated by the fact that he has neuropathy. Subsequently he was trying to do what he could do to help alleviate some of his discomfort and what he noted was that pressure to the toe actually helped relieve some of the neuropathic pain. So we decided he was going to wrap his toe with tape and it felt good so he decided to leave it through the night. Unfortunately this was too much compression that actually cut off the blood flow to the toe and because a degloving injury to the toe pretty much from the base to the tip of the great toe. Unfortunately this has not completely  healed yet but fortunately I do not think it is likely going to end up is something that he will lose but nonetheless this is still questionable at this point. The patient states that he knows he should have done this and is hopefully learned his lesson he tells me that he has not been more careful and not do this type of thing in the future. Patient does have a  history of diabetes mellitus type 2, diabetic neuropathy related to the diabetes type 2, hypertension, and the neuropathy in his case is fairly severe. His most recent hemoglobin A1c was 6.5 that was on March 05, 2022 he does take a 325 mg aspirin. 07-31-2022 upon evaluation today patient appears to be doing well currently in regard to his wound. He has been tolerating the dressing changes without complication and in general I feel like that he is actually making some really good progress here but at the same time draining quite significantly. I think we need to do something to improve the drainage aspect here he is in agreement with that plan. 08-08-2022 upon evaluation today patient appears to be doing well currently in regard to his toe ulcer. He has been tolerating the dressing changes without complication. Fortunately there does not appear to be any signs of active infection locally or systemically which is great news. No fevers, chills, nausea, vomiting, or diarrhea. 08-18-2022 upon evaluation today patient appears to be doing well currently in regard to his big toe. He is going require some debridement today but in general seems to be making some good progress. Fortunately I do not see any signs of active infection locally or systemically. 08-25-2022 upon evaluation today patient appears to be doing well currently in regard to his wound. He is actually making good progress in regard to the toe without any signs of infection he seems to be really doing quite well. Fortunately I do not see any evidence of active infection locally nor  systemically which is great news. No fevers, chills, nausea, vomiting, or diarrhea. 09-02-2022 upon evaluation today patient's toe actually appears to be making excellent progress. Fortunately there does not appear to be any signs of infection locally nor systemically which is great news and overall I am extremely pleased with where we stand today. 09-11-2022 upon evaluation today patient appears to be doing well currently in regard to his wound. Has been tolerating the dressing changes without complication and fortunately I do believe that we are moving in the right direction. I do not see any evidence of overall worsening and I think that he is actually making good improvement the measurements are smaller and we are improving to the point that I am extremely happy with the progress today. 09-18-2022 upon evaluation today patient appears to be doing well currently in regard to his wound. He is actually tolerating the dressing changes without complication. Fortunately there does not appear to be any evidence of active infection locally or systemically which is great news. No fevers, chills, nausea, vomiting, or diarrhea. ERNAN, RUNKLES ([id number]) [id number] Page 3 of 8 09-25-2022 upon evaluation today patient appears to be doing well currently in regard to his wound. He has been tolerating the dressing changes without complication and is making really good progress here. Fortunately I do not see any evidence of active infection locally nor systemically which is great news. No fevers, chills, nausea, vomiting, or diarrhea. 10-02-2022 upon evaluation patient's wound actually showing signs of excellent improvement and I am very pleased with where we stand today. I do not see any evidence of active infection at this time which is great news and I do believe that we are making excellent headway towards closure. He does seem to be a little bit red in regard to the toe compared  to normal I am a little concerned about the possibility of cellulitis. Electronic Signature(s) Signed: 10/02/2022 1:47:37 PM By: Allen Derry PA-C Entered By:  Allen Derry on 10/02/2022 13:47:37 -------------------------------------------------------------------------------- Physical Exam Details Patient Name: Date of Service: MONTRAY, KLIEBERT RD Elbert Ewings 10/02/2022 12:45 PM Medical Record Number: [id number] Patient Account Number: [id number] Date of Birth/Sex: Treating RN: April 14, 1946 (77 y.o. [name] Primary Care Provider: [name] Other Clinician: [name] Referring Provider: Treating Provider/Extender: [name] in Treatment: 10 Constitutional Well-nourished and well-hydrated in no acute distress. Respiratory normal breathing without difficulty. Psychiatric this patient is able to make decisions and demonstrates good insight into disease process. Alert and Oriented x 3. pleasant and cooperative. Notes Upon inspection patient's wound bed actually showed signs of good granulation epithelization at this point. Fortunately I do not see any evidence of worsening which is great news and in general I do think that we are moving in the right direction at this time. Electronic Signature(s) Signed: 10/02/2022 1:47:51 PM By: Allen Derry PA-C Entered By: Allen Derry on 10/02/2022 13:47:50 -------------------------------------------------------------------------------- Physician Orders Details Patient Name: Date of Service: [name] 10/02/2022 12:45 PM Medical Record Number: [id number] Patient Account Number: [id number] Date of Birth/Sex: Treating RN: 06/28/1945 (77 y.o. [name] Primary Care Provider: [name] Other Clinician: [name] Referring Provider: Treating Provider/Extender: [name] in Treatment: [address] ([id number]) [phone].pdf Page 4  of 8 Verbal / Phone Orders: Yes Clinician: Angelina Pih Read Back and Verified: Yes Diagnosis Coding ICD-10 Coding Code Description E11.621 Type 2 diabetes mellitus with foot ulcer L97.522 Non-pressure chronic ulcer of other part of left foot with fat layer exposed E11.40 Type 2 diabetes mellitus with diabetic neuropathy, unspecified I10 Essential (primary) hypertension Follow-up Appointments Return Appointment in 1 week. Bathing/ [employer] wounds with antibacterial soap and water. Off-Loading Open toe surgical shoe Medications-Please add to medication list. ntibiotics - start on Doxycycline as directed P.O. A Wound Treatment Wound #1 - T Great oe Wound Laterality: Left, Circumferential Cleanser: Byram Ancillary Kit - 15 Day Supply (Generic) 1 x Per Day/15 Days Discharge Instructions: Use supplies as instructed; Kit contains: (15) Saline Bullets; (15) 3x3 Gauze; 15 pr Gloves Cleanser: Soap and Water 1 x Per Day/15 Days Discharge Instructions: Gently cleanse wound with antibacterial soap, rinse and pat dry prior to dressing wounds Prim Dressing: Silvercel Small 2x2 (in/in) (Generic) 1 x Per Day/15 Days ary Discharge Instructions: Apply Silvercel Small 2x2 (in/in) as instructed Secondary Dressing: Gauze (Generic) 1 x Per Day/15 Days Secured With: Medipore T - 65M Medipore H Soft Cloth Surgical T ape ape, 2x2 (in/yd) (Generic) 1 x Per Day/15 Days Patient Medications llergies: esomeprazole, atenolol, HCTZ, enalapril, gabapentin A Notifications Medication Indication Start End 10/02/2022 doxycycline hyclate DOSE 1 - oral 100 mg capsule - 1 capsule oral twice a day x 14 days Electronic Signature(s) Signed: 10/02/2022 1:49:22 PM By: Allen Derry PA-C Entered By: Allen Derry on 10/02/2022 13:49:22 -------------------------------------------------------------------------------- Problem List Details Patient Name: Date of Service: [name] 10/02/2022 12:45  PM Medical Record Number: [id number] Patient Account Number: [id number] Date of Birth/Sex: Treating RN: 1945/05/26 (77 y.o. [name] Primary Care Provider: [name] Other Clinician: [name] Referring Provider: Treating Provider/Extender: [name] in Treatment: [address] ([id number]) [phone].pdf Page 5 of 8 Active Problems ICD-10 Encounter Code Description Active Date MDM Diagnosis E11.621 Type 2 diabetes mellitus with foot ulcer 07/24/2022 No Yes L97.522 Non-pressure chronic ulcer of other part of left foot with fat layer exposed 07/24/2022 No Yes E11.40 Type 2 diabetes mellitus with diabetic neuropathy,  unspecified 07/24/2022 No Yes I10 Essential (primary) hypertension 07/24/2022 No Yes Inactive Problems Resolved Problems Electronic Signature(s) Signed: 10/02/2022 1:03:45 PM By: Allen Derry PA-C Entered By: Allen Derry on 10/02/2022 13:03:45 -------------------------------------------------------------------------------- Progress Note Details Patient Name: Date of Service: [name] 10/02/2022 12:45 PM Medical Record Number: [id number] Patient Account Number: [id number] Date of Birth/Sex: Treating RN: 03-15-1946 (76 y.o. [name] Primary Care Provider: [name] Other Clinician: [name] Referring Provider: Treating Provider/Extender: [name] in Treatment: 10 Subjective Chief Complaint Information obtained from Patient Left great toe ulcer History of Present Illness (HPI) 07-24-2022 upon evaluation today patient presents for initial inspection here in our clinic concerning issues that has been having with a wound on his left great toe. This is actually a circumferential issue which was precipitated by the fact that he has neuropathy. Subsequently he was trying to do what he could do to help alleviate some of his discomfort and  what he noted was that pressure to the toe actually helped relieve some of the neuropathic pain. So we decided he was going to wrap his toe with tape and it felt good so he decided to leave it through the night. Unfortunately this was too much compression that actually cut off the blood flow to the toe and because a degloving injury to the toe pretty much from the base to the tip of the great toe. Unfortunately this has not completely healed yet but fortunately I do not think it is likely going to end up is something that he will lose but nonetheless this is still questionable at this point. The patient states that he knows he should have done this and is hopefully learned his lesson he tells me that he has not been more careful and not do this type of thing in the future. Patient does have a history of diabetes mellitus type 2, diabetic neuropathy related to the diabetes type 2, hypertension, and the neuropathy in his case is fairly severe. His most recent hemoglobin A1c was 6.5 that was on March 05, 2022 he does take a 325 mg aspirin. 07-31-2022 upon evaluation today patient appears to be doing well currently in regard to his wound. He has been tolerating the dressing changes without complication and in general I feel like that he is actually making some really good progress here but at the same time draining quite significantly. I think we need to do something to improve the drainage aspect here he is in agreement with that plan. EBONY, RICKEL ([id number]) [id number] Page 6 of 8 08-08-2022 upon evaluation today patient appears to be doing well currently in regard to his toe ulcer. He has been tolerating the dressing changes without complication. Fortunately there does not appear to be any signs of active infection locally or systemically which is great news. No fevers, chills, nausea, vomiting, or diarrhea. 08-18-2022 upon evaluation today patient appears to be  doing well currently in regard to his big toe. He is going require some debridement today but in general seems to be making some good progress. Fortunately I do not see any signs of active infection locally or systemically. 08-25-2022 upon evaluation today patient appears to be doing well currently in regard to his wound. He is actually making good progress in regard to the toe without any signs of infection he seems to be really doing quite well. Fortunately I do not see any evidence of active infection locally nor systemically which is great news. No  fevers, chills, nausea, vomiting, or diarrhea. 09-02-2022 upon evaluation today patient's toe actually appears to be making excellent progress. Fortunately there does not appear to be any signs of infection locally nor systemically which is great news and overall I am extremely pleased with where we stand today. 09-11-2022 upon evaluation today patient appears to be doing well currently in regard to his wound. Has been tolerating the dressing changes without complication and fortunately I do believe that we are moving in the right direction. I do not see any evidence of overall worsening and I think that he is actually making good improvement the measurements are smaller and we are improving to the point that I am extremely happy with the progress today. 09-18-2022 upon evaluation today patient appears to be doing well currently in regard to his wound. He is actually tolerating the dressing changes without complication. Fortunately there does not appear to be any evidence of active infection locally or systemically which is great news. No fevers, chills, nausea, vomiting, or diarrhea. 09-25-2022 upon evaluation today patient appears to be doing well currently in regard to his wound. He has been tolerating the dressing changes without complication and is making really good progress here. Fortunately I do not see any evidence of active infection locally nor  systemically which is great news. No fevers, chills, nausea, vomiting, or diarrhea. 10-02-2022 upon evaluation patient's wound actually showing signs of excellent improvement and I am very pleased with where we stand today. I do not see any evidence of active infection at this time which is great news and I do believe that we are making excellent headway towards closure. He does seem to be a little bit red in regard to the toe compared to normal I am a little concerned about the possibility of cellulitis. Objective Constitutional Well-nourished and well-hydrated in no acute distress. Vitals Time Taken: 1:03 PM, Height: 72 in, Weight: 253 lbs, BMI: 34.3, Temperature: 98.2 F, Pulse: 80 bpm, Respiratory Rate: 18 breaths/min, Blood Pressure: 151/90 mmHg. Respiratory normal breathing without difficulty. Psychiatric this patient is able to make decisions and demonstrates good insight into disease process. Alert and Oriented x 3. pleasant and cooperative. General Notes: Upon inspection patient's wound bed actually showed signs of good granulation epithelization at this point. Fortunately I do not see any evidence of worsening which is great news and in general I do think that we are moving in the right direction at this time. Integumentary (Hair, Skin) Wound #1 status is Open. Original cause of wound was Shear/Friction. The date acquired was: 07/17/2022. The wound has been in treatment 10 weeks. The wound is located on the Left,Circumferential T Great. The wound measures 1cm length x 0.4cm width x 0.1cm depth; 0.314cm^2 area and 0.031cm^3 volume. oe There is Fat Layer (Subcutaneous Tissue) exposed. There is a medium amount of serosanguineous drainage noted. The wound margin is flat and intact. There is medium (34-66%) red, pink granulation within the wound bed. There is a small (1-33%) amount of necrotic tissue within the wound bed including Adherent Slough. Assessment Active Problems ICD-10 Type 2  diabetes mellitus with foot ulcer Non-pressure chronic ulcer of other part of left foot with fat layer exposed Type 2 diabetes mellitus with diabetic neuropathy, unspecified Essential (primary) hypertension Procedures Wound #1 Pre-procedure diagnosis of Wound #1 is a Diabetic Wound/Ulcer of the Lower Extremity located on the Left,Circumferential T Great .Severity of Tissue Pre oe Debridement is: Fat layer exposed. There was a Selective/Open Wound Non-Viable Tissue Debridement with a  total area of 0.31 sq cm performed by Lora Havens, Ferdinand Lango ([id number]) [phone].pdf Page 7 of 8 [place], [place]. With the following instrument(s): Curette to remove Viable and Non-Viable tissue/material. Material removed includes Callus. A time out was conducted at 13:33, prior to the start of the procedure. A Minimum amount of bleeding was controlled with Pressure. The procedure was tolerated well. Post Debridement Measurements: 1cm length x 0.4cm width x 0.1cm depth; 0.031cm^3 volume. Character of Wound/Ulcer Post Debridement is stable. Severity of Tissue Post Debridement is: Fat layer exposed. Post procedure Diagnosis Wound #1: Same as Pre-Procedure Plan Follow-up Appointments: Return Appointment in 1 week. Bathing/ Shower/ Hygiene: Wash wounds with antibacterial soap and water. Off-Loading: Open toe surgical shoe Medications-Please add to medication list.: P.O. Antibiotics - start on Doxycycline as directed The following medication(s) was prescribed: doxycycline hyclate oral 100 mg capsule 1 1 capsule oral twice a day x 14 days starting 10/02/2022 WOUND #1: - T Great Wound Laterality: Left, Circumferential oe Cleanser: Byram Ancillary Kit - 15 Day Supply (Generic) 1 x Per Day/15 Days Discharge Instructions: Use supplies as instructed; Kit contains: (15) Saline Bullets; (15) 3x3 Gauze; 15 pr Gloves Cleanser: Soap and Water 1 x Per Day/15 Days Discharge Instructions: Gently  cleanse wound with antibacterial soap, rinse and pat dry prior to dressing wounds Prim Dressing: Silvercel Small 2x2 (in/in) (Generic) 1 x Per Day/15 Days ary Discharge Instructions: Apply Silvercel Small 2x2 (in/in) as instructed Secondary Dressing: Gauze (Generic) 1 x Per Day/15 Days Secured With: Medipore T - 52M Medipore H Soft Cloth Surgical T ape ape, 2x2 (in/yd) (Generic) 1 x Per Day/15 Days 1. I would recommend that we have the patient continue to utilize the wound care measures as before and specifically this is including the silver alginate dressing along with the gauze to cover. He seems to be doing a really good job with this. 2 I would recommend that we go ahead and send in a prescription for doxycycline. This is something that he has not had any trouble taking before that we are aware of and again there is no major interactions with any other medications at this time. 3. I am going to recommend as well that the patient should continue to keep pressure off of the bottom of the toe that should allow this to heal effectively and quickly I am hopeful that along with the antibiotics he will be able to make sure nothing worsens and hopefully will be close to complete resolution shortly. We will see patient back for reevaluation in 1 week here in the clinic. If anything worsens or changes patient will contact our office for additional recommendations. Electronic Signature(s) Signed: 10/02/2022 1:49:36 PM By: Allen Derry PA-C Entered By: Allen Derry on 10/02/2022 13:49:35 -------------------------------------------------------------------------------- SuperBill Details Patient Name: Date of Service: [name] 10/02/2022 Medical Record Number: [id number] Patient Account Number: [id number] Date of Birth/Sex: Treating RN: 02-03-1946 (77 y.o. [name] Primary Care Provider: [name] Other Clinician: [name] Referring Provider: Treating Provider/Extender:  [name] in Treatment: 10 Diagnosis Coding ICD-10 Codes Code Description [phone] Type 2 diabetes mellitus with foot ulcer L97.522 Non-pressure chronic ulcer of other part of left foot with fat layer exposed E11.40 Type 2 diabetes mellitus with diabetic neuropathy, unspecified LORETTA, KLUENDER ([id number]) [id number] Page 8 of 8 I10 Essential (primary) hypertension Facility Procedures : CPT4 Code: [id number] Description: [phone] - DEBRIDE WOUND 1ST 20 SQ CM OR < ICD-10 Diagnosis Description L97.522 Non-pressure chronic ulcer of  other part of left foot with fat layer exposed Modifier: Quantity: 1 Physician Procedures : CPT4 Code Description Modifier [id number] - WC PHYS DEBR WO ANESTH 20 SQ CM ICD-10 Diagnosis Description L97.522 Non-pressure chronic ulcer of other part of left foot with fat layer exposed Quantity: 1 Electronic Signature(s) Signed: 10/02/2022 1:49:43 PM By: Allen Derry PA-C Entered By: Allen Derry on 10/02/2022 13:49:43

## 2022-10-02 NOTE — Progress Notes (Signed)
Douglas [name] [id number] Page 1 of 9 Visit Report for 10/02/2022 Arrival Information Details Patient Name: Date of Service: Douglas [name] Douglas [name]. 10/02/2022 12:45 PM Medical Record Number: [id number] Patient Account Number: [id number] Date of Birth/Sex: Treating RN: 03-13-1946 (77 y.o. Douglas [name] Primary Care [name]: [name] Other Clinician: [name] Referring Daevion Navarette: Treating Tayte Childers/Extender: [name] in Treatment: 10 Visit Information History Since Last Visit All ordered tests and consults were completed: No Patient Arrived: Ambulatory Added or deleted any medications: No Arrival Time: 13:02 Any new allergies or adverse reactions: No Transfer Assistance: None Had a fall or experienced change in No Patient Identification Verified: Yes activities of daily living that may affect Secondary Verification Process Completed: Yes risk of falls: Patient Requires Transmission-Based Precautions: No Signs or symptoms of abuse/neglect since last visito No Patient Has Alerts: Yes Hospitalized since last visit: No Patient Alerts: Diabetes type 2 Implantable device outside of the clinic excluding No ASA 325mg  cellular tissue based products placed in the center since last visit: Has Dressing in Place as Prescribed: Yes Has Footwear/Offloading in Place as Prescribed: Yes Left: Wedge Shoe Pain Present Now: Yes Electronic Signature(s) Signed: 10/02/2022 5:12:55 PM By: [name] Entered By: [name] on 10/02/2022 13:02:58 -------------------------------------------------------------------------------- Clinic Level of Care Assessment Details Patient Name: Date of Service: [name], BARNOSKY [name] Douglas [name] 10/02/2022 12:45 PM Medical Record Number: [id number] Patient Account Number: [id number] Date of Birth/Sex: Treating RN: 1945/06/18 (77 y.o. Douglas [name] Primary Care [name]: [name] Other Clinician: [name] Referring Aslee Such: Treating [name]/Extender: [name] in Treatment: 10 Clinic Level of Care Assessment Items TOOL 1 Quantity Score []  - 0 Use when EandM and Procedure is performed on INITIAL visit ASSESSMENTS - Nursing Assessment / Reassessment []  - 0 General Physical Exam (combine w/ comprehensive assessment (listed just below) when performed on new pt. 657 Douglas [name]. [name], [name] ([id number]) [id number] Page 2 of 9 []  - 0 Comprehensive Assessment (HX, ROS, Risk Assessments, Wounds Hx, etc.) ASSESSMENTS - Wound and Skin Assessment / Reassessment []  - 0 Dermatologic / Skin Assessment (not related to wound area) ASSESSMENTS - Ostomy and/or Continence Assessment and Care []  - 0 Incontinence Assessment and Management []  - 0 Ostomy Care Assessment and Management (repouching, etc.) PROCESS - Coordination of Care []  - 0 Simple Patient / Family Education for ongoing care []  - 0 Complex (extensive) Patient / Family Education for ongoing care []  - 0 Staff obtains Chiropractor, Records, T Results / Process Orders est []  - 0 Staff telephones HHA, Nursing Homes / Clarify orders / etc []  - 0 Routine Transfer to another Facility (non-emergent condition) []  - 0 Routine Hospital Admission (non-emergent condition) []  - 0 New Admissions / Manufacturing engineer / Ordering NPWT Apligraf, etc. , []  - 0 Emergency Hospital Admission (emergent condition) PROCESS - Special Needs []  - 0 Pediatric / Minor Patient Management []  - 0 Isolation Patient Management []  - 0 Hearing / Language / Visual special needs []  - 0 Assessment of Community assistance (transportation, D/C planning, etc.) []  - 0 Additional assistance / Altered mentation []  - 0 Support Surface(s) Assessment (bed, cushion, seat, etc.) INTERVENTIONS - Miscellaneous []  - 0 External ear exam []  - 0 Patient Transfer (multiple staff /  Nurse, adult / Similar devices) []  - 0 Simple Staple / Suture removal (25 or less) []  - 0 Complex Staple / Suture removal (26 or more) []  - 0 Hypo/Hyperglycemic Management (do not check if billed separately) []  -  0 Ankle / Brachial Index (ABI) - do not check if billed separately Has the patient been seen at the hospital within the last three years: Yes Total Score: 0 Level Of Care: ____ Electronic Signature(s) Signed: 10/02/2022 5:12:55 PM By: [name] Entered By: [name] on 10/02/2022 13:40:02 -------------------------------------------------------------------------------- Encounter Discharge Information Details Patient Name: Date of Service: Bobetta Lime [name] [name]. 10/02/2022 12:45 PM Medical Record Number: [id number] Patient Account Number: [id number] Date of Birth/Sex: Treating RN: [birth year] (77 y.o. Douglas [name] ([id number]) [phone].pdf Page 3 of 9 Primary Care Douglas [name]: [name] Other Clinician: [name] Referring [name]: Treating Zanita Millman/Extender: [name] in Treatment: 10 Encounter Discharge Information Items Post Procedure Vitals Discharge Condition: Stable Temperature (F): 98.2 Ambulatory Status: Ambulatory Pulse (bpm): 80 Discharge Destination: Home Respiratory Rate (breaths/min): 18 Transportation: Private Auto Blood Pressure (mmHg): 151/90 Accompanied By: self Schedule Follow-up Appointment: Yes Clinical Summary of Care: Electronic Signature(s) Signed: 10/02/2022 5:12:55 PM By: [name] Entered By: [name] on 10/02/2022 14:24:01 -------------------------------------------------------------------------------- Lower Extremity Assessment Details Patient Name: Date of Service: KERT, MAKOSKY [name] [name]. 10/02/2022 12:45 PM Medical Record Number: [id number] Patient Account Number: [id number] Date of Birth/Sex: Treating RN: 03-30-1946 (77 y.o. Douglas [name] Primary Care Kare Dado: [name] Other Clinician: [name] Referring Jocsan Mcginley: Treating [name]/Extender: [name] in Treatment: 10 Edema Assessment Assessed: Kyra Searles: Yes] [Right: No] Edema: [Left: Ye] [Right: s] Calf Left: Right: Point of Measurement: 34 cm From Medial Instep 37.8 cm Ankle Left: Right: Point of Measurement: 9 cm From Medial Instep 24 cm Vascular Assessment Pulses: Dorsalis Pedis Palpable: [Left:Yes] Electronic Signature(s) Signed: 10/02/2022 4:16:40 PM By: Angelina Pih Signed: 10/02/2022 5:12:55 PM By: [name] Entered By: [name] on 10/02/2022 13:11:09 [name] ([id number]) [id number] Page 4 of 9 -------------------------------------------------------------------------------- Multi Wound Chart Details Patient Name: Date of Service: CYLAS, BELMONT [name] Douglas [name] 10/02/2022 12:45 PM Medical Record Number: [id number] Patient Account Number: [id number] Date of Birth/Sex: Treating RN: 1945/05/10 (77 y.o. Douglas [name] Primary Care Asti Mackley: [name] Other Clinician: [name] Referring Moussa Wiegand: Treating Takeysha Bonk/Extender: [name] in Treatment: 10 Vital Signs Height(in): 72 Pulse(bpm): 80 Weight(lbs): 253 Blood Pressure(mmHg): 151/90 Body Mass Index(BMI): 34.3 Temperature(F): 98.2 Respiratory Rate(breaths/min): 18 [1:Photos:] [N/A:N/A] Left, Circumferential T Great oe N/A N/A Wound Location: Shear/Friction N/A N/A Wounding Event: Diabetic Wound/Ulcer of the Lower N/A N/A Primary Etiology: Extremity Sleep Apnea, Hypertension, Type II N/A N/A Comorbid History: Diabetes, Neuropathy 07/17/2022 N/A N/A Date Acquired: 10 N/A N/A Weeks of Treatment: Open N/A N/A Wound Status: No N/A N/A Wound Recurrence: Yes N/A N/A Pending A mputation on Presentation: 1x0.4x0.1 N/A N/A Measurements [name] x W x D (cm) 0.314 N/A  N/A A (cm) : rea 0.031 N/A N/A Volume (cm) : 99.40% N/A N/A % Reduction in A rea: 99.40% N/A N/A % Reduction in Volume: Grade 1 N/A N/A Classification: Medium N/A N/A Exudate A mount: Serosanguineous N/A N/A Exudate Type: red, brown N/A N/A Exudate Color: Flat and Intact N/A N/A Wound Margin: Medium (34-66%) N/A N/A Granulation A mount: Red, Pink N/A N/A Granulation Quality: Small (1-33%) N/A N/A Necrotic A mount: Fat Layer (Subcutaneous Tissue): Yes N/A N/A Exposed Structures: Fascia: No Tendon: No Muscle: No Joint: No Bone: No None N/A N/A Epithelialization: Treatment Notes Electronic Signature(s) Signed: 10/02/2022 5:12:55 PM By: [name] Entered By: [name] on 10/02/2022 13:11:21 [name] ([id number]) [id number] Page 5 of 9 -------------------------------------------------------------------------------- Multi-Disciplinary Care Plan Details Patient Name: Date  of Service: RAJVIR, BYRDSONG [name] Douglas [name] 10/02/2022 12:45 PM Medical Record Number: [id number] Patient Account Number: [id number] Date of Birth/Sex: Treating RN: 11-20-1945 (77 y.o. Douglas [name] Primary Care Jorja Empie: [name] Other Clinician: [name] Referring Taijuan Serviss: Treating Rayvn Rickerson/Extender: [name] in Treatment: 10 Active Inactive Wound/Skin Impairment Nursing Diagnoses: Impaired tissue integrity Knowledge deficit related to ulceration/compromised skin integrity Goals: Patient/caregiver will verbalize understanding of skin care regimen Date Initiated: 07/24/2022 Date Inactivated: 08/08/2022 Target Resolution Date: 08/23/2022 Goal Status: Met Ulcer/skin breakdown will have a volume reduction of 30% by week 4 Date Initiated: 07/24/2022 Date Inactivated: 08/25/2022 Target Resolution Date: 08/23/2022 Goal Status: Met Ulcer/skin breakdown will have a volume reduction of 50% by week 8 Date Initiated:  07/24/2022 Date Inactivated: 09/25/2022 Target Resolution Date: 09/23/2022 Goal Status: Met Ulcer/skin breakdown will have a volume reduction of 80% by week 12 Date Initiated: 07/24/2022 Target Resolution Date: 10/23/2022 Goal Status: Active Ulcer/skin breakdown will heal within 14 weeks Date Initiated: 07/24/2022 Target Resolution Date: 11/06/2022 Goal Status: Active Interventions: Assess patient/caregiver ability to obtain necessary supplies Assess patient/caregiver ability to perform ulcer/skin care regimen upon admission and as needed Assess ulceration(s) every visit Provide education on ulcer and skin care Treatment Activities: Referred to DME Tyeson Tanimoto for dressing supplies : 07/24/2022 Skin care regimen initiated : 07/24/2022 Notes: Electronic Signature(s) Signed: 10/02/2022 4:16:40 PM By: Angelina Pih Signed: 10/02/2022 5:12:55 PM By: [name] Entered By: [name] on 10/02/2022 13:40:16 [name] ([id number]) [id number] Page 6 of 9 -------------------------------------------------------------------------------- Pain Assessment Details Patient Name: Date of Service: ESHAN, HAYMAN 10/02/2022 12:45 PM Medical Record Number: [id number] Patient Account Number: [id number] Date of Birth/Sex: Treating RN: [birth year] (77 y.o. Douglas [name] Primary Care Stefannie Defeo: [name] Other Clinician: [name] Referring Ricarda Atayde: Treating Shanicqua Coldren/Extender: [name] in Treatment: 10 Active Problems Location of Pain Severity and Description of Pain Patient Has Paino Yes Site Locations Pain Location: Pain in Ulcers Duration of the Pain. Constant / Intermittento Constant Rate the pain. Current Pain Level: 5 Character of Pain Describe the Pain: Aching, Burning, Other: stinging Pain Management and Medication Current Pain Management: Medication: Yes Cold Application: No Rest: No Massage:  No Activity: No T.E.N.S.: No Heat Application: No Leg drop or elevation: No Is the Current Pain Management Adequate: Inadequate How does your wound impact your activities of daily livingo Sleep: No Bathing: No Appetite: No Relationship With Others: No Bladder Continence: No Emotions: No Bowel Continence: No Work: No Toileting: No Drive: No Dressing: No Hobbies: No Electronic Signature(s) Signed: 10/02/2022 4:16:40 PM By: Angelina Pih Signed: 10/02/2022 5:12:55 PM By: [name] Entered By: [name] on 10/02/2022 14:22:53 [name] ([id number]) [id number] Page 7 of 9 -------------------------------------------------------------------------------- Patient/Caregiver Education Details Patient Name: Date of Service: KAUAN, SCHOLLE 6/20/2024andnbsp12:45 PM Medical Record Number: [id number] Patient Account Number: [id number] Date of Birth/Gender: Treating RN: 04/14/1946 (77 y.o. Douglas [name] Primary Care Physician: [name] Other Clinician: [name] Referring Physician: Treating Physician/Extender: [name] in Treatment: 10 Education Assessment Education Provided To: Patient Education Topics Provided Wound/Skin Impairment: Handouts: Other: continue wound care as directed Methods: Explain/Verbal Responses: State content correctly Electronic Signature(s) Signed: 10/02/2022 5:12:55 PM By: [name] Entered By: [name] on 10/02/2022 13:40:38 -------------------------------------------------------------------------------- Wound Assessment Details Patient Name: Date of Service: Bobetta Lime [name] [name]. 10/02/2022 12:45 PM Medical Record Number: [id number] Patient Account Number: [id number] Date of Birth/Sex: Treating RN: 09-05-1945 (77 y.o. Douglas [name] Primary Care Taqwa Deem:  [name] Other Clinician: [name] Referring Deontrae Drinkard: Treating  Markel Mergenthaler/Extender: [name] in Treatment: 10 Wound Status Wound Number: 1 Primary Etiology: Diabetic Wound/Ulcer of the Lower Extremity Wound Location: Left, Circumferential T Great oe Wound Status: Open Wounding Event: Shear/Friction Comorbid History: Sleep Apnea, Hypertension, Type II Diabetes, Neuropathy Date Acquired: 07/17/2022 Weeks Of Treatment: 10 Clustered Wound: No Pending Amputation On Presentation Photos AARAF, PLAZA ([id number]) [phone].pdf Page 8 of 9 Wound Measurements Length: (cm) 1 Width: (cm) 0.4 Depth: (cm) 0.1 Area: (cm) 0.314 Volume: (cm) 0.031 % Reduction in Area: 99.4% % Reduction in Volume: 99.4% Epithelialization: None Wound Description Classification: Grade 1 Wound Margin: Flat and Intact Exudate Amount: Medium Exudate Type: Serosanguineous Exudate Color: red, brown Foul Odor After Cleansing: No Slough/Fibrino Yes Wound Bed Granulation Amount: Medium (34-66%) Exposed Structure Granulation Quality: Red, Pink Fascia Exposed: No Necrotic Amount: Small (1-33%) Fat Layer (Subcutaneous Tissue) Exposed: Yes Necrotic Quality: Adherent Slough Tendon Exposed: No Muscle Exposed: No Joint Exposed: No Bone Exposed: No Treatment Notes Wound #1 (Toe Great) Wound Laterality: Left, Circumferential Cleanser [place]ram Ancillary Kit - 15 Day Supply Discharge Instruction: Use supplies as instructed; Kit contains: (15) Saline Bullets; (15) 3x3 Gauze; 15 pr Gloves Soap and Water Discharge Instruction: Gently cleanse wound with antibacterial soap, rinse and pat dry prior to dressing wounds Peri-Wound Care Topical Primary Dressing Silvercel Small 2x2 (in/in) Discharge Instruction: Apply Silvercel Small 2x2 (in/in) as instructed Secondary Dressing Gauze Secured With Medipore T - 72M Medipore H Soft Cloth Surgical T ape ape, 2x2 (in/yd) Compression Wrap Compression Stockings Add-Ons Electronic  Signature(s) Signed: 10/02/2022 4:16:40 PM By: Angelina Pih Signed: 10/02/2022 5:12:55 PM By: [name] Entered By: [name] on 10/02/2022 13:10:00 [name] ([id number]) [id number] Page 9 of 9 -------------------------------------------------------------------------------- Vitals Details Patient Name: Date of Service: AJON, BORSUK [name] Douglas [name] 10/02/2022 12:45 PM Medical Record Number: [id number] Patient Account Number: [id number] Date of Birth/Sex: Treating RN: 09/02/1945 (77 y.o. Douglas [name] Primary Care October Peery: [name] Other Clinician: [name] Referring [place]: Treating Kimberly Coye/Extender: [name] in Treatment: 10 Vital Signs Time Taken: 13:03 Temperature (F): 98.2 Height (in): 72 Pulse (bpm): 80 Weight (lbs): 253 Respiratory Rate (breaths/min): 18 Body Mass Index (BMI): 34.3 Blood Pressure (mmHg): 151/90 Reference Range: 80 - 120 mg / dl Electronic Signature(s) Signed: 10/02/2022 5:12:55 PM By: [name] Entered By: [name] on 10/02/2022 13:05:16

## 2022-10-06 DIAGNOSIS — H16223 Keratoconjunctivitis sicca, not specified as Sjogren's, bilateral: Secondary | ICD-10-CM | POA: Diagnosis not present

## 2022-10-08 NOTE — Progress Notes (Signed)
LAVARR, PRESIDENT ([id number]) [id number] Page 1 of 7 Visit Report for 09/18/2022 Chief Complaint Document Details Patient Name: Date of Service: Douglas [name], [name] 09/18/2022 8:00 A M Medical Record Number: [id number] Patient Account Number: [id number] Date of Birth/Sex: Treating RN: [birth year] (77 y.o. Judie Petit) Yevonne Pax Primary Care Provider: [name] Other Clinician: Referring Provider: Treating Provider/Extender: [name] in Treatment: 8 Information Obtained from: Patient Chief Complaint Left great toe ulcer Electronic Signature(s) Signed: 09/18/2022 8:02:38 AM By: Allen Derry PA-C Entered By: Allen Derry on 09/18/2022 08:02:37 -------------------------------------------------------------------------------- Debridement Details Patient Name: Date of Service: Douglas Lime RD L. 09/18/2022 8:00 A M Medical Record Number: [id number] Patient Account Number: [id number] Date of Birth/Sex: Treating RN: 05/07/1945 (77 y.o. Judie Petit) Yevonne Pax Primary Care Provider: [name] Other Clinician: Referring Provider: Treating Provider/Extender: [name] in Treatment: 8 Debridement Performed for Assessment: Wound #1 Left,Circumferential T Great oe Performed By: Physician Allen Derry, PA-C Debridement Type: Debridement Severity of Tissue Pre Debridement: Fat layer exposed Level of Consciousness (Pre-procedure): Awake and Alert Pre-procedure Verification/Time Out Yes - 08:13 Taken: Start Time: 08:13 Percent of Wound Bed Debrided: 100% T Area Debrided (cm): otal 6.36 Tissue and other material debrided: Viable, Non-Viable, Callus, Slough, Subcutaneous, Skin: Dermis , Skin: Epidermis, Slough Level: Skin/Subcutaneous Tissue Debridement Description: Excisional Instrument: Curette Bleeding: Minimum Hemostasis Achieved: Pressure End Time: 08:16 Procedural Pain: 0 Post Procedural Pain: 0 Douglas [name], Douglas [name] ([id number]) [id number] Page 2 of 7 Response to Treatment: Procedure was tolerated well Level of Consciousness (Post- Awake and Alert procedure): Post Debridement Measurements of Total Wound Length: (cm) 3 Width: (cm) 2.7 Depth: (cm) 0.1 Volume: (cm) 0.636 Character of Wound/Ulcer Post Debridement: Improved Severity of Tissue Post Debridement: Fat layer exposed Post Procedure Diagnosis Same as Pre-procedure Electronic Signature(s) Signed: 09/18/2022 4:39:56 PM By: Allen Derry PA-C Signed: 10/08/2022 11:52:51 AM By: Yevonne Pax RN Entered By: Yevonne Pax on 09/18/2022 08:15:43 -------------------------------------------------------------------------------- HPI Details Patient Name: Date of Service: Douglas Lime RD L. 09/18/2022 8:00 A M Medical Record Number: [id number] Patient Account Number: [id number] Date of Birth/Sex: Treating RN: 02-15-1946 (77 y.o. Melonie [place] Primary Care Provider: [name] Other Clinician: Referring Provider: Treating Provider/Extender: [name] in Treatment: 8 History of Present Illness HPI Description: 07-24-2022 upon evaluation today patient presents for initial inspection here in our clinic concerning issues that has been having with a wound on his left great toe. This is actually a circumferential issue which was precipitated by the fact that he has neuropathy. Subsequently he was trying to do what he could do to help alleviate some of his discomfort and what he noted was that pressure to the toe actually helped relieve some of the neuropathic pain. So we decided he was going to wrap his toe with tape and it felt good so he decided to leave it through the night. Unfortunately this was too much compression that actually cut off the blood flow to the toe and because a degloving injury to the toe pretty much from the base to the tip of the great toe. Unfortunately this has not  completely healed yet but fortunately I do not think it is likely going to end up is something that he will lose but nonetheless this is still questionable at this point. The patient states that he knows he should have done this and is hopefully learned his lesson he tells me that he has not been more careful and not do this type of  thing in the future. Patient does have a history of diabetes mellitus type 2, diabetic neuropathy related to the diabetes type 2, hypertension, and the neuropathy in his case is fairly severe. His most recent hemoglobin A1c was 6.5 that was on March 05, 2022 he does take a 325 mg aspirin. 07-31-2022 upon evaluation today patient appears to be doing well currently in regard to his wound. He has been tolerating the dressing changes without complication and in general I feel like that he is actually making some really good progress here but at the same time draining quite significantly. I think we need to do something to improve the drainage aspect here he is in agreement with that plan. 08-08-2022 upon evaluation today patient appears to be doing well currently in regard to his toe ulcer. He has been tolerating the dressing changes without complication. Fortunately there does not appear to be any signs of active infection locally or systemically which is great news. No fevers, chills, nausea, vomiting, or diarrhea. 08-18-2022 upon evaluation today patient appears to be doing well currently in regard to his big toe. He is going require some debridement today but in general seems to be making some good progress. Fortunately I do not see any signs of active infection locally or systemically. 08-25-2022 upon evaluation today patient appears to be doing well currently in regard to his wound. He is actually making good progress in regard to the toe without any signs of infection he seems to be really doing quite well. Fortunately I do not see any evidence of active infection locally  nor systemically which is great news. No fevers, chills, nausea, vomiting, or diarrhea. 09-02-2022 upon evaluation today patient's toe actually appears to be making excellent progress. Fortunately there does not appear to be any signs of infection locally nor systemically which is great news and overall I am extremely pleased with where we stand today. 09-11-2022 upon evaluation today patient appears to be doing well currently in regard to his wound. Has been tolerating the dressing changes without complication and fortunately I do believe that we are moving in the right direction. I do not see any evidence of overall worsening and I think that he is actually making good improvement the measurements are smaller and we are improving to the point that I am extremely happy with the progress today. Douglas [name], Douglas [name] ([id number]) [id number] Page 3 of 7 09-18-2022 upon evaluation today patient appears to be doing well currently in regard to his wound. He is actually tolerating the dressing changes without complication. Fortunately there does not appear to be any evidence of active infection locally or systemically which is great news. No fevers, chills, nausea, vomiting, or diarrhea. Electronic Signature(s) Signed: 09/18/2022 8:40:10 AM By: Allen Derry PA-C Entered By: Allen Derry on 09/18/2022 08:40:10 -------------------------------------------------------------------------------- Physical Exam Details Patient Name: Date of Service: Douglas Lime RD L. 09/18/2022 8:00 A M Medical Record Number: [id number] Patient Account Number: [id number] Date of Birth/Sex: Treating RN: 25-Dec-1945 (77 y.o. Melonie [place] Primary Care Provider: [name] Other Clinician: Referring Provider: Treating Provider/Extender: [name] in Treatment: 8 Constitutional Well-nourished and well-hydrated in no acute distress. Respiratory normal breathing without  difficulty. Psychiatric this patient is able to make decisions and demonstrates good insight into disease process. Alert and Oriented x 3. pleasant and cooperative. Notes Upon inspection patient's wound bed actually showed signs of good granulation point. Fortunately I do not see any evidence of active infection locally nor systemically which is  great news. and epithelization at this no fevers, chills, nausea, vomiting, or diarrhea. Electronic Signature(s) Signed: 09/18/2022 8:40:36 AM By: Allen Derry PA-C Entered By: Allen Derry on 09/18/2022 08:40:36 -------------------------------------------------------------------------------- Physician Orders Details Patient Name: Date of Service: Douglas Lime RD L. 09/18/2022 8:00 A M Medical Record Number: [id number] Patient Account Number: [id number] Date of Birth/Sex: Treating RN: 03-22-1946 (77 y.o. Judie Petit) Yevonne Pax Primary Care Provider: [name] Other Clinician: Referring Provider: Treating Provider/Extender: [name] in Treatment: 8 Verbal / Phone Orders: No Diagnosis [address] Douglas [name], Douglas [name] ([id number]) [id number] Page 4 of 7 ICD-10 Coding Code Description E11.621 Type 2 diabetes mellitus with foot ulcer L97.522 Non-pressure chronic ulcer of other part of left foot with fat layer exposed E11.40 Type 2 diabetes mellitus with diabetic neuropathy, unspecified I10 Essential (primary) hypertension Follow-up Appointments Return Appointment in 1 week. Bathing/ [employer] wounds with antibacterial soap and water. Off-Loading Open toe surgical shoe Wound Treatment Wound #1 - T Great oe Wound Laterality: Left, Circumferential Cleanser: Byram Ancillary Kit - 15 Day Supply (Generic) 1 x Per Day/15 Days Discharge Instructions: Use supplies as instructed; Kit contains: (15) Saline Bullets; (15) 3x3 Gauze; 15 pr Gloves Cleanser: Soap and Water 1 x Per Day/15  Days Discharge Instructions: Gently cleanse wound with antibacterial soap, rinse and pat dry prior to dressing wounds Prim Dressing: Silvercel Small 2x2 (in/in) (Generic) 1 x Per Day/15 Days ary Discharge Instructions: Apply Silvercel Small 2x2 (in/in) as instructed Secondary Dressing: Gauze 1 x Per Day/15 Days Secured With: Medipore T - 106M Medipore H Soft Cloth Surgical T ape ape, 2x2 (in/yd) (Generic) 1 x Per Day/15 Days Electronic Signature(s) Signed: 09/18/2022 4:39:56 PM By: Allen Derry PA-C Signed: 10/08/2022 11:52:51 AM By: Yevonne Pax RN Entered By: Yevonne Pax on 09/18/2022 08:13:27 -------------------------------------------------------------------------------- Problem List Details Patient Name: Date of Service: Douglas Lime RD L. 09/18/2022 8:00 A M Medical Record Number: [id number] Patient Account Number: [id number] Date of Birth/Sex: Treating RN: Oct 18, 1945 (77 y.o. Melonie [place] Primary Care Provider: [name] Other Clinician: Referring Provider: Treating Provider/Extender: [name] in Treatment: 8 Active Problems ICD-10 Encounter Code Description Active Date MDM Diagnosis E11.621 Type 2 diabetes mellitus with foot ulcer 07/24/2022 No Yes L97.522 Non-pressure chronic ulcer of other part of left foot with fat layer exposed 07/24/2022 No Yes Douglas [name], Douglas [name] ([id number]) [id number] Page 5 of 7 E11.40 Type 2 diabetes mellitus with diabetic neuropathy, unspecified 07/24/2022 No Yes I10 Essential (primary) hypertension 07/24/2022 No Yes Inactive Problems Resolved Problems Electronic Signature(s) Signed: 09/18/2022 8:02:35 AM By: Allen Derry PA-C Entered By: Allen Derry on 09/18/2022 08:02:34 -------------------------------------------------------------------------------- Progress Note Details Patient Name: Date of Service: Douglas Lime RD L. 09/18/2022 8:00 A M Medical Record Number: [id number] Patient  Account Number: [id number] Date of Birth/Sex: Treating RN: 1945-10-03 (77 y.o. Judie Petit) Yevonne Pax Primary Care Provider: [name] Other Clinician: Referring Provider: Treating Provider/Extender: [name] in Treatment: 8 Subjective Chief Complaint Information obtained from Patient Left great toe ulcer History of Present Illness (HPI) 07-24-2022 upon evaluation today patient presents for initial inspection here in our clinic concerning issues that has been having with a wound on his left great toe. This is actually a circumferential issue which was precipitated by the fact that he has neuropathy. Subsequently he was trying to do what he could do to help alleviate some of his discomfort and what he noted was that pressure to the toe actually helped relieve some of the neuropathic pain.  So we decided he was going to wrap his toe with tape and it felt good so he decided to leave it through the night. Unfortunately this was too much compression that actually cut off the blood flow to the toe and because a degloving injury to the toe pretty much from the base to the tip of the great toe. Unfortunately this has not completely healed yet but fortunately I do not think it is likely going to end up is something that he will lose but nonetheless this is still questionable at this point. The patient states that he knows he should have done this and is hopefully learned his lesson he tells me that he has not been more careful and not do this type of thing in the future. Patient does have a history of diabetes mellitus type 2, diabetic neuropathy related to the diabetes type 2, hypertension, and the neuropathy in his case is fairly severe. His most recent hemoglobin A1c was 6.5 that was on March 05, 2022 he does take a 325 mg aspirin. 07-31-2022 upon evaluation today patient appears to be doing well currently in regard to his wound. He has been tolerating the dressing changes  without complication and in general I feel like that he is actually making some really good progress here but at the same time draining quite significantly. I think we need to do something to improve the drainage aspect here he is in agreement with that plan. 08-08-2022 upon evaluation today patient appears to be doing well currently in regard to his toe ulcer. He has been tolerating the dressing changes without complication. Fortunately there does not appear to be any signs of active infection locally or systemically which is great news. No fevers, chills, nausea, vomiting, or diarrhea. 08-18-2022 upon evaluation today patient appears to be doing well currently in regard to his big toe. He is going require some debridement today but in general seems to be making some good progress. Fortunately I do not see any signs of active infection locally or systemically. 08-25-2022 upon evaluation today patient appears to be doing well currently in regard to his wound. He is actually making good progress in regard to the toe without any signs of infection he seems to be really doing quite well. Fortunately I do not see any evidence of active infection locally nor systemically which is great news. No fevers, chills, nausea, vomiting, or diarrhea. 09-02-2022 upon evaluation today patient's toe actually appears to be making excellent progress. Fortunately there does not appear to be any signs of infection locally nor systemically which is great news and overall I am extremely pleased with where we stand today. 09-11-2022 upon evaluation today patient appears to be doing well currently in regard to his wound. Has been tolerating the dressing changes without complication and fortunately I do believe that we are moving in the right direction. I do not see any evidence of overall worsening and I think that he is actually making good improvement the measurements are smaller and we are improving to the point that I am  extremely happy with the progress today. Douglas [name], Douglas [name] [id number] Page 6 of 7 09-18-2022 upon evaluation today patient appears to be doing well currently in regard to his wound. He is actually tolerating the dressing changes without complication. Fortunately there does not appear to be any evidence of active infection locally or systemically which is great news. No fevers, chills, nausea, vomiting, or diarrhea. Objective Constitutional Well-nourished and well-hydrated in no  acute distress. Vitals Time Taken: 8:04 AM, Height: 72 in, Weight: 253 lbs, BMI: 34.3, Temperature: 98.1 F, Pulse: 75 bpm, Respiratory Rate: 18 breaths/min, Blood Pressure: 184/94 mmHg. Respiratory normal breathing without difficulty. Psychiatric this patient is able to make decisions and demonstrates good insight into disease process. Alert and Oriented x 3. pleasant and cooperative. General Notes: Upon inspection patient's wound bed actually showed signs of good granulation point. Fortunately I do not see any evidence of active infection locally nor systemically which is great news. and epithelization at this no fevers, chills, nausea, vomiting, or diarrhea. Integumentary (Hair, Skin) Wound #1 status is Open. Original cause of wound was Shear/Friction. The date acquired was: 07/17/2022. The wound has been in treatment 8 weeks. The wound is located on the Left,Circumferential T Great. The wound measures 3cm length x 2.7cm width x 0.1cm depth; 6.362cm^2 area and 0.636cm^3 volume. There oe is Fat Layer (Subcutaneous Tissue) exposed. There is no tunneling or undermining noted. There is a medium amount of serosanguineous drainage noted. The wound margin is flat and intact. There is medium (34-66%) red, pink granulation within the wound bed. There is a small (1-33%) amount of necrotic tissue within the wound bed including Adherent Slough. Assessment Active Problems ICD-10 Type 2  diabetes mellitus with foot ulcer Non-pressure chronic ulcer of other part of left foot with fat layer exposed Type 2 diabetes mellitus with diabetic neuropathy, unspecified Essential (primary) hypertension Procedures Wound #1 Pre-procedure diagnosis of Wound #1 is a Diabetic Wound/Ulcer of the Lower Extremity located on the Left,Circumferential T Great .Severity of Tissue Pre oe Debridement is: Fat layer exposed. There was a Excisional Skin/Subcutaneous Tissue Debridement with a total area of 6.36 sq cm performed by Allen Derry, PA-C. With the following instrument(s): Curette to remove Viable and Non-Viable tissue/material. Material removed includes Callus, Subcutaneous Tissue, Slough, Skin: Dermis, and Skin: Epidermis. A time out was conducted at 08:13, prior to the start of the procedure. A Minimum amount of bleeding was controlled with Pressure. The procedure was tolerated well with a pain level of 0 throughout and a pain level of 0 following the procedure. Post Debridement Measurements: 3cm length x 2.7cm width x 0.1cm depth; 0.636cm^3 volume. Character of Wound/Ulcer Post Debridement is improved. Severity of Tissue Post Debridement is: Fat layer exposed. Post procedure Diagnosis Wound #1: Same as Pre-Procedure Plan Follow-up Appointments: Return Appointment in 1 week. Bathing/ Shower/ Hygiene: Wash wounds with antibacterial soap and water. Off-Loading: Open toe surgical shoe WOUND #1: - T Great Wound Laterality: Left, Circumferential oe Cleanser: Byram Ancillary Kit - 15 Day Supply (Generic) 1 x Per Day/15 Days Discharge Instructions: Use supplies as instructed; Kit contains: (15) Saline Bullets; (15) 3x3 Gauze; 15 pr Gloves Cleanser: Soap and Water 1 x Per Day/15 Days Discharge Instructions: Gently cleanse wound with antibacterial soap, rinse and pat dry prior to dressing wounds Prim Dressing: Silvercel Small 2x2 (in/in) (Generic) 1 x Per Day/15 Days ary Douglas [name], Douglas [name]  ([id number]) [id number] Page 7 of 7 Discharge Instructions: Apply Silvercel Small 2x2 (in/in) as instructed Secondary Dressing: Gauze 1 x Per Day/15 Days Secured With: Medipore T - 53M Medipore H Soft Cloth Surgical T ape ape, 2x2 (in/yd) (Generic) 1 x Per Day/15 Days 1. I am going to recommend that we have the patient continue to monitor for any evidence of infection or worsening. Based on what I am seeing and postdebridement this looks to be doing much better and very pleased in that regard. 2. I am good recommend as  well that the patient should continue to monitor for any signs of infection or worsening. Office if anything changes he knows contact the office and let me know. We will see patient back for reevaluation in 1 week here in the clinic. If anything worsens or changes patient will contact our office for additional recommendations. Electronic Signature(s) Signed: 09/18/2022 8:40:52 AM By: Allen Derry PA-C Entered By: Allen Derry on 09/18/2022 08:40:52 -------------------------------------------------------------------------------- SuperBill Details Patient Name: Date of Service: Douglas Lime RD L. 09/18/2022 Medical Record Number: [id number] Patient Account Number: [id number] Date of Birth/Sex: Treating RN: 10/05/1945 (77 y.o. Judie Petit) Yevonne Pax Primary Care Provider: [name] Other Clinician: Referring Provider: Treating Provider/Extender: [name] in Treatment: 8 Diagnosis Coding ICD-10 Codes Code Description [phone] Type 2 diabetes mellitus with foot ulcer L97.522 Non-pressure chronic ulcer of other part of left foot with fat layer exposed E11.40 Type 2 diabetes mellitus with diabetic neuropathy, unspecified I10 Essential (primary) hypertension Facility Procedures : CPT4 Code: [id number] Description: 11042 - DEB SUBQ TISSUE 20 SQ CM/< ICD-10 Diagnosis Description L97.522 Non-pressure chronic ulcer of other part of  left foot with fat layer exposed Modifier: Quantity: 1 Physician Procedures : CPT4 Code Description Modifier [id number] 11042 - WC PHYS SUBQ TISS 20 SQ CM ICD-10 Diagnosis Description L97.522 Non-pressure chronic ulcer of other part of left foot with fat layer exposed Quantity: 1 Electronic Signature(s) Signed: 09/18/2022 8:41:17 AM By: Allen Derry PA-C Entered By: Allen Derry on 09/18/2022 08:41:16

## 2022-10-08 NOTE — Progress Notes (Signed)
KRISHAN, MCBREEN ([id number]) [id number] Page 1 of 8 Visit Report for 09/18/2022 Arrival Information Details Patient Name: Date of Service: REINER, LOEWEN RD L. 09/18/2022 8:00 A M Medical Record Number: [id number] Patient Account Number: [id number] Date of Birth/Sex: Treating RN: February 02, 1946 (77 y.o. Judie Petit) Yevonne Pax Primary Care Kathleena Freeman: [name] Other Clinician: Referring Charmane Protzman: Treating Bryten Maher/Extender: [name] in Treatment: 8 Visit Information History Since Last Visit Added or deleted any medications: No Patient Arrived: Ambulatory Any new allergies or adverse reactions: No Arrival Time: 08:00 Had a fall or experienced change in No Accompanied By: self activities of daily living that may affect Transfer Assistance: None risk of falls: Patient Identification Verified: Yes Signs or symptoms of abuse/neglect since last visito No Secondary Verification Process Completed: Yes Hospitalized since last visit: No Patient Requires Transmission-Based Precautions: No Implantable device outside of the clinic excluding No Patient Has Alerts: Yes cellular tissue based products placed in the center Patient Alerts: Diabetes type 2 since last visit: ASA 325mg  Has Dressing in Place as Prescribed: Yes Has Compression in Place as Prescribed: Yes Pain Present Now: No Electronic Signature(s) Signed: 10/08/2022 11:52:51 AM By: Yevonne Pax RN Entered By: Yevonne Pax on 09/18/2022 08:04:07 -------------------------------------------------------------------------------- Clinic Level of Care Assessment Details Patient Name: Date of Service: JJESUS, DINGLEY RD L. 09/18/2022 8:00 A M Medical Record Number: [id number] Patient Account Number: [id number] Date of Birth/Sex: Treating RN: May 14, 1945 (77 y.o. Judie Petit) Yevonne Pax Primary Care Zayon Trulson: [name] Other Clinician: Referring Kinzlie Harney: Treating Arvid Marengo/Extender: [name] in Treatment: 8 Clinic Level of Care Assessment Items TOOL 1 Quantity Score []  - 0 Use when EandM and Procedure is performed on INITIAL visit ASSESSMENTS - Nursing Assessment / Reassessment []  - 0 General Physical Exam (combine w/ comprehensive assessment (listed just below) when performed on new pt. evals) []  - 0 Comprehensive Assessment (HX, ROS, Risk Assessments, Wounds Hx, etc.) AMOR, PACKARD ([id number]) [phone].pdf Page 2 of 8 ASSESSMENTS - Wound and Skin Assessment / Reassessment []  - 0 Dermatologic / Skin Assessment (not related to wound area) ASSESSMENTS - Ostomy and/or Continence Assessment and Care []  - 0 Incontinence Assessment and Management []  - 0 Ostomy Care Assessment and Management (repouching, etc.) PROCESS - Coordination of Care []  - 0 Simple Patient / Family Education for ongoing care []  - 0 Complex (extensive) Patient / Family Education for ongoing care []  - 0 Staff obtains Chiropractor, Records, T Results / Process Orders est []  - 0 Staff telephones HHA, Nursing Homes / Clarify orders / etc []  - 0 Routine Transfer to another Facility (non-emergent condition) []  - 0 Routine Hospital Admission (non-emergent condition) []  - 0 New Admissions / Manufacturing engineer / Ordering NPWT Apligraf, etc. , []  - 0 Emergency Hospital Admission (emergent condition) PROCESS - Special Needs []  - 0 Pediatric / Minor Patient Management []  - 0 Isolation Patient Management []  - 0 Hearing / Language / Visual special needs []  - 0 Assessment of Community assistance (transportation, D/C planning, etc.) []  - 0 Additional assistance / Altered mentation []  - 0 Support Surface(s) Assessment (bed, cushion, seat, etc.) INTERVENTIONS - Miscellaneous []  - 0 External ear exam []  - 0 Patient Transfer (multiple staff / Nurse, adult / Similar devices) []  - 0 Simple Staple / Suture removal (25 or less) []  - 0 Complex  Staple / Suture removal (26 or more) []  - 0 Hypo/Hyperglycemic Management (do not check if billed separately) []  - 0 Ankle / Brachial Index (ABI) - do not  check if billed separately Has the patient been seen at the hospital within the last three years: Yes Total Score: 0 Level Of Care: ____ Electronic Signature(s) Signed: 10/08/2022 11:52:51 AM By: Yevonne Pax RN Entered By: Yevonne Pax on 09/18/2022 08:15:52 -------------------------------------------------------------------------------- Encounter Discharge Information Details Patient Name: Date of Service: [name] 09/18/2022 8:00 A M Medical Record Number: [id number] Patient Account Number: [id number] Date of Birth/Sex: Treating RN: 24-Apr-1945 (77 y.o. Melonie [place] Primary Care Kendrew Paci: [name] Other Clinician: Referring Auther Lyerly: Treating Arlina Sabina/Extender: Rossie Muskrat Aristes, Ferdinand Lango ([id number]) [phone].pdf Page 3 of 8 Weeks in Treatment: 8 Encounter Discharge Information Items Post Procedure Vitals Discharge Condition: Stable Temperature (F): 98.1 Ambulatory Status: Ambulatory Pulse (bpm): 75 Discharge Destination: Home Respiratory Rate (breaths/min): 18 Transportation: Private Auto Blood Pressure (mmHg): 184/94 Accompanied By: self Schedule Follow-up Appointment: Yes Clinical Summary of Care: Electronic Signature(s) Signed: 10/08/2022 11:52:51 AM By: Yevonne Pax RN Entered By: Yevonne Pax on 09/18/2022 08:21:18 -------------------------------------------------------------------------------- Lower Extremity Assessment Details Patient Name: Date of Service: [name] 09/18/2022 8:00 A M Medical Record Number: [id number] Patient Account Number: [id number] Date of Birth/Sex: Treating RN: 1945/07/29 (77 y.o. Judie Petit) Yevonne Pax Primary Care Presley Gora: [name] Other Clinician: Referring Keira Bohlin: Treating Muscab Brenneman/Extender: [name] in Treatment: 8 Electronic Signature(s) Signed: 10/08/2022 11:52:51 AM By: Yevonne Pax RN Entered By: Yevonne Pax on 09/18/2022 08:08:16 -------------------------------------------------------------------------------- Multi Wound Chart Details Patient Name: Date of Service: [name] 09/18/2022 8:00 A M Medical Record Number: [id number] Patient Account Number: [id number] Date of Birth/Sex: Treating RN: [birth year] (77 y.o. Melonie [place] Primary Care Jyasia Markoff: [name] Other Clinician: Referring Shontia Gillooly: Treating Jamelle Goldston/Extender: [name] in Treatment: 8 Vital Signs Height(in): 72 Pulse(bpm): 75 Weight(lbs): 253 Blood Pressure(mmHg): 184/94 Body Mass Index(BMI): 34.3 Temperature(F): 98.1 Respiratory Rate(breaths/min): 18 [Cada, Ben L ([id number]):Photos:] [phone].pdf Page 4 of 8:1 N/A N/A N/A N/A] Left, Circumferential T Great oe N/A N/A Wound Location: Shear/Friction N/A N/A Wounding Event: Diabetic Wound/Ulcer of the Lower N/A N/A Primary Etiology: Extremity Sleep Apnea, Hypertension, Type II N/A N/A Comorbid History: Diabetes, Neuropathy 07/17/2022 N/A N/A Date Acquired: 8 N/A N/A Weeks of Treatment: Open N/A N/A Wound Status: No N/A N/A Wound Recurrence: Yes N/A N/A Pending A mputation on Presentation: 3x2.7x0.1 N/A N/A Measurements L x W x D (cm) 6.362 N/A N/A A (cm) : rea 0.636 N/A N/A Volume (cm) : 87.90% N/A N/A % Reduction in A rea: 87.90% N/A N/A % Reduction in Volume: Grade 1 N/A N/A Classification: Medium N/A N/A Exudate A mount: Serosanguineous N/A N/A Exudate Type: red, brown N/A N/A Exudate Color: Flat and Intact N/A N/A Wound Margin: Medium (34-66%) N/A N/A Granulation A mount: Red, Pink N/A N/A Granulation Quality: Small (1-33%) N/A N/A Necrotic A mount: Fat Layer (Subcutaneous Tissue): Yes N/A N/A Exposed  Structures: Fascia: No Tendon: No Muscle: No Joint: No Bone: No None N/A N/A Epithelialization: Treatment Notes Electronic Signature(s) Signed: 10/08/2022 11:52:51 AM By: Yevonne Pax RN Entered By: Yevonne Pax on 09/18/2022 08:08:24 -------------------------------------------------------------------------------- Multi-Disciplinary Care Plan Details Patient Name: Date of Service: [name] 09/18/2022 8:00 A M Medical Record Number: [id number] Patient Account Number: [id number] Date of Birth/Sex: Treating RN: [birth year] (77 y.o. Judie Petit) Yevonne Pax Primary Care Pistol Kessenich: [name] Other Clinician: Referring Alaisha Eversley: Treating Jaimee Corum/Extender: [name] in Treatment: 8 Active Inactive Wound/Skin Impairment TADD, HOLTMEYER ([id number]) [phone].pdf Page 5 of 8 Nursing Diagnoses: Impaired tissue integrity Knowledge  deficit related to ulceration/compromised skin integrity Goals: Patient/caregiver will verbalize understanding of skin care regimen Date Initiated: 07/24/2022 Date Inactivated: 08/08/2022 Target Resolution Date: 08/23/2022 Goal Status: Met Ulcer/skin breakdown will have a volume reduction of 30% by week 4 Date Initiated: 07/24/2022 Date Inactivated: 08/25/2022 Target Resolution Date: 08/23/2022 Goal Status: Met Ulcer/skin breakdown will have a volume reduction of 50% by week 8 Date Initiated: 07/24/2022 Target Resolution Date: 09/23/2022 Goal Status: Active Ulcer/skin breakdown will have a volume reduction of 80% by week 12 Date Initiated: 07/24/2022 Target Resolution Date: 10/23/2022 Goal Status: Active Ulcer/skin breakdown will heal within 14 weeks Date Initiated: 07/24/2022 Target Resolution Date: 11/06/2022 Goal Status: Active Interventions: Assess patient/caregiver ability to obtain necessary supplies Assess patient/caregiver ability to perform ulcer/skin care regimen upon admission and as  needed Assess ulceration(s) every visit Provide education on ulcer and skin care Treatment Activities: Referred to DME Tamakia Porto for dressing supplies : 07/24/2022 Skin care regimen initiated : 07/24/2022 Notes: Electronic Signature(s) Signed: 10/08/2022 11:52:51 AM By: Yevonne Pax RN Entered By: Yevonne Pax on 09/18/2022 08:08:54 -------------------------------------------------------------------------------- Pain Assessment Details Patient Name: Date of Service: [name] 09/18/2022 8:00 A M Medical Record Number: [id number] Patient Account Number: [id number] Date of Birth/Sex: Treating RN: 1945/12/17 (77 y.o. Melonie [place] Primary Care Mahaila Tischer: [name] Other Clinician: Referring Ellyn Rubiano: Treating Cornelius Schuitema/Extender: [name] in Treatment: 8 Active Problems Location of Pain Severity and Description of Pain Patient Has Paino No Site Locations [place] ([id number]) [phone].pdf Page 6 of 8 Pain Management and Medication Current Pain Management: Electronic Signature(s) Signed: 10/08/2022 11:52:51 AM By: Yevonne Pax RN Entered By: Yevonne Pax on 09/18/2022 08:04:37 -------------------------------------------------------------------------------- Patient/Caregiver Education Details Patient Name: Date of Service: Bobetta Lime RD Elbert Ewings 6/6/2024andnbsp8:00 A M Medical Record Number: [id number] Patient Account Number: [id number] Date of Birth/Gender: Treating RN: 09/07/1945 (77 y.o. Melonie [place] Primary Care Physician: [name] Other Clinician: Referring Physician: Treating Physician/Extender: [name] in Treatment: 8 Education Assessment Education Provided To: Patient Education Topics Provided Wound/Skin Impairment: Methods: Explain/Verbal Responses: State content correctly Electronic Signature(s) Signed: 10/08/2022 11:52:51 AM By: Yevonne Pax RN Entered  By: Yevonne Pax on 09/18/2022 08:09:07 [name] ([id number]) [id number] Page 7 of 8 -------------------------------------------------------------------------------- Wound Assessment Details Patient Name: Date of Service: AZARIA, BARTELL RD L. 09/18/2022 8:00 A M Medical Record Number: [id number] Patient Account Number: [id number] Date of Birth/Sex: Treating RN: May 06, 1945 (77 y.o. Judie Petit) Yevonne Pax Primary Care Aleisa Howk: [name] Other Clinician: Referring Oyindamola Key: Treating Jeron Grahn/Extender: [name] in Treatment: 8 Wound Status Wound Number: 1 Primary Etiology: Diabetic Wound/Ulcer of the Lower Extremity Wound Location: Left, Circumferential T Great oe Wound Status: Open Wounding Event: Shear/Friction Comorbid History: Sleep Apnea, Hypertension, Type II Diabetes, Neuropathy Date Acquired: 07/17/2022 Weeks Of Treatment: 8 Clustered Wound: No Pending Amputation On Presentation Photos Wound Measurements Length: (cm) 3 Width: (cm) 2.7 Depth: (cm) 0.1 Area: (cm) 6.362 Volume: (cm) 0.636 % Reduction in Area: 87.9% % Reduction in Volume: 87.9% Epithelialization: None Tunneling: No Undermining: No Wound Description Classification: Grade 1 Wound Margin: Flat and Intact Exudate Amount: Medium Exudate Type: Serosanguineous Exudate Color: red, brown Foul Odor After Cleansing: No Slough/Fibrino Yes Wound Bed Granulation Amount: Medium (34-66%) Exposed Structure Granulation Quality: Red, Pink Fascia Exposed: No Necrotic Amount: Small (1-33%) Fat Layer (Subcutaneous Tissue) Exposed: Yes Necrotic Quality: Adherent Slough Tendon Exposed: No Muscle Exposed: No Joint Exposed: No Bone Exposed: No Electronic Signature(s) Signed: 10/08/2022 11:52:51 AM By: Yevonne Pax RN  Entered By: Yevonne Pax on 09/18/2022 08:08:03 [name] ([id number]) [id number].pdf Page 8 of  8 -------------------------------------------------------------------------------- Vitals Details Patient Name: Date of Service: TAJUAN, DUFAULT RD L. 09/18/2022 8:00 A M Medical Record Number: [id number] Patient Account Number: [id number] Date of Birth/Sex: Treating RN: 07-Jul-1945 (77 y.o. Judie Petit) Yevonne Pax Primary Care Argus Caraher: [name] Other Clinician: Referring Kaedan Richert: Treating Gibran Veselka/Extender: [name] in Treatment: 8 Vital Signs Time Taken: 08:04 Temperature (F): 98.1 Height (in): 72 Pulse (bpm): 75 Weight (lbs): 253 Respiratory Rate (breaths/min): 18 Body Mass Index (BMI): 34.3 Blood Pressure (mmHg): 184/94 Reference Range: 80 - 120 mg / dl Electronic Signature(s) Signed: 10/08/2022 11:52:51 AM By: Yevonne Pax RN Entered By: Yevonne Pax on 09/18/2022 08:04:30

## 2022-10-09 ENCOUNTER — Encounter: Payer: PPO | Admitting: Physician Assistant

## 2022-10-09 DIAGNOSIS — E1140 Type 2 diabetes mellitus with diabetic neuropathy, unspecified: Secondary | ICD-10-CM | POA: Diagnosis not present

## 2022-10-09 DIAGNOSIS — I10 Essential (primary) hypertension: Secondary | ICD-10-CM | POA: Diagnosis not present

## 2022-10-09 DIAGNOSIS — G629 Polyneuropathy, unspecified: Secondary | ICD-10-CM | POA: Diagnosis not present

## 2022-10-09 DIAGNOSIS — E11621 Type 2 diabetes mellitus with foot ulcer: Secondary | ICD-10-CM | POA: Diagnosis not present

## 2022-10-09 NOTE — Progress Notes (Addendum)
ISSAM, COMMINGS ([id number]) [id number] Page 1 of 6 Visit Report for 10/09/2022 Chief Complaint Document Details Patient Name: Date of Service: ADEM, GAMM RD L. 10/09/2022 10:45 A M Medical Record Number: [id number] Patient Account Number: [id number] Date of Birth/Sex: Treating RN: [birth year] (77 y.o. [name] Primary Care Provider: [name] Other Clinician: [name] Referring Provider: Treating Provider/Extender: [name] in Treatment: 11 Information Obtained from: Patient Chief Complaint Left great toe ulcer Electronic Signature(s) Signed: 10/09/2022 10:55:15 AM By: Allen Derry PA-C Entered By: Allen Derry on 10/09/2022 10:55:15 -------------------------------------------------------------------------------- HPI Details Patient Name: Date of Service: [name] 10/09/2022 10:45 A M Medical Record Number: [id number] Patient Account Number: [id number] Date of Birth/Sex: Treating RN: 10/21/1945 (77 y.o. [name] Primary Care Provider: [name] Other Clinician: [name] Referring Provider: Treating Provider/Extender: [name] in Treatment: 11 History of Present Illness HPI Description: 07-24-2022 upon evaluation today patient presents for initial inspection here in our clinic concerning issues that has been having with a wound on his left great toe. This is actually a circumferential issue which was precipitated by the fact that he has neuropathy. Subsequently he was trying to do what he could do to help alleviate some of his discomfort and what he noted was that pressure to the toe actually helped relieve some of the neuropathic pain. So we decided he was going to wrap his toe with tape and it felt good so he decided to leave it through the night. Unfortunately this was too much compression that actually cut off the blood flow to the toe  and because a degloving injury to the toe pretty much from the base to the tip of the great toe. Unfortunately this has not completely healed yet but fortunately I do not think it is likely going to end up is something that he will lose but nonetheless this is still questionable at this point. The patient states that he knows he should have done this and is hopefully learned his lesson he tells me that he has not been more careful and not do this type of thing in the future. Patient does have a history of diabetes mellitus type 2, diabetic neuropathy related to the diabetes type 2, hypertension, and the neuropathy in his case is fairly severe. His most recent hemoglobin A1c was 6.5 that was on March 05, 2022 he does take a 325 mg aspirin. 07-31-2022 upon evaluation today patient appears to be doing well currently in regard to his wound. He has been tolerating the dressing changes without complication and in general I feel like that he is actually making some really good progress here but at the same time draining quite significantly. I think we need to do something to improve the drainage aspect here he is in agreement with that plan. 08-08-2022 upon evaluation today patient appears to be doing well currently in regard to his toe ulcer. He has been tolerating the dressing changes without CARRON, NEE ([id number]) [id number] Page 2 of 6 complication. Fortunately there does not appear to be any signs of active infection locally or systemically which is great news. No fevers, chills, nausea, vomiting, or diarrhea. 08-18-2022 upon evaluation today patient appears to be doing well currently in regard to his big toe. He is going require some debridement today but in general seems to be making some good progress. Fortunately I do not see any signs of active infection locally or systemically. 08-25-2022 upon evaluation today  patient appears to be doing well currently in  regard to his wound. He is actually making good progress in regard to the toe without any signs of infection he seems to be really doing quite well. Fortunately I do not see any evidence of active infection locally nor systemically which is great news. No fevers, chills, nausea, vomiting, or diarrhea. 09-02-2022 upon evaluation today patient's toe actually appears to be making excellent progress. Fortunately there does not appear to be any signs of infection locally nor systemically which is great news and overall I am extremely pleased with where we stand today. 09-11-2022 upon evaluation today patient appears to be doing well currently in regard to his wound. Has been tolerating the dressing changes without complication and fortunately I do believe that we are moving in the right direction. I do not see any evidence of overall worsening and I think that he is actually making good improvement the measurements are smaller and we are improving to the point that I am extremely happy with the progress today. 09-18-2022 upon evaluation today patient appears to be doing well currently in regard to his wound. He is actually tolerating the dressing changes without complication. Fortunately there does not appear to be any evidence of active infection locally or systemically which is great news. No fevers, chills, nausea, vomiting, or diarrhea. 09-25-2022 upon evaluation today patient appears to be doing well currently in regard to his wound. He has been tolerating the dressing changes without complication and is making really good progress here. Fortunately I do not see any evidence of active infection locally nor systemically which is great news. No fevers, chills, nausea, vomiting, or diarrhea. 10-02-2022 upon evaluation patient's wound actually showing signs of excellent improvement and I am very pleased with where we stand today. I do not see any evidence of active infection at this time which is great news and  I do believe that we are making excellent headway towards closure. He does seem to be a little bit red in regard to the toe compared to normal I am a little concerned about the possibility of cellulitis. 10-09-2022 upon evaluation today patient's wound actually on the toe appears to be completely healed. I am actually very pleased with where things stand I think that he has done excellent and in general I do think that we are moving in the right direction and I believe in fact he is completely healed. Electronic Signature(s) Signed: 10/09/2022 6:24:37 PM By: Allen Derry PA-C Entered By: Allen Derry on 10/09/2022 18:24:37 -------------------------------------------------------------------------------- Physical Exam Details Patient Name: Date of Service: CRISTOS, YAZZIE RD L. 10/09/2022 10:45 A M Medical Record Number: [id number] Patient Account Number: [id number] Date of Birth/Sex: Treating RN: [birth year] (77 y.o. [name] Primary Care Provider: [name] Other Clinician: [name] Referring Provider: Treating Provider/Extender: [name] in Treatment: 11 Constitutional Well-nourished and well-hydrated in no acute distress. Respiratory normal breathing without difficulty. Psychiatric this patient is able to make decisions and demonstrates good insight into disease process. Alert and Oriented x 3. pleasant and cooperative. Notes Upon inspection patient's wound again showed complete epithelization I do not see any signs of worsening overall in general I do believe that he is making excellent progress towards complete resolution. Electronic Signature(s) Signed: 10/09/2022 6:25:05 PM By: Allen Derry PA-C Entered By: Allen Derry on 10/09/2022 18:25:05 [name] ([id number]) [id number] Page 3 of 6 -------------------------------------------------------------------------------- Physician Orders Details Patient  Name: Date of Service: EVERSON, PRESLEY RD L. 10/09/2022  10:45 A M Medical Record Number: [id number] Patient Account Number: [id number] Date of Birth/Sex: Treating RN: 21-Aug-1945 (77 y.o. [name] Primary Care Provider: [name] Other Clinician: [name] Referring Provider: Treating Provider/Extender: [name] in Treatment: 11 Verbal / Phone Orders: Yes Clinician: Angelina Pih Read Back and Verified: Yes Diagnosis Coding ICD-10 Coding Code Description E11.621 Type 2 diabetes mellitus with foot ulcer L97.522 Non-pressure chronic ulcer of other part of left foot with fat layer exposed E11.40 Type 2 diabetes mellitus with diabetic neuropathy, unspecified I10 Essential (primary) hypertension Discharge From [facility] Discharge from [facility] Treatment Complete - Wear surgical shoe x 1 week then slowly transition into regular shoe apply AandD ointment then gauze for protection to toe Electronic Signature(s) Signed: 10/10/2022 11:45:42 AM By: [name] Signed: 10/10/2022 1:40:48 PM By: Allen Derry PA-C Entered By: [name] on 10/09/2022 11:36:15 -------------------------------------------------------------------------------- Problem List Details Patient Name: Date of Service: [name] 10/09/2022 10:45 A M Medical Record Number: [id number] Patient Account Number: [id number] Date of Birth/Sex: Treating RN: Sep 09, 1945 (77 y.o. [name] Primary Care Provider: [name] Other Clinician: [name] Referring Provider: Treating Provider/Extender: [name] in Treatment: 11 Active Problems ICD-10 Encounter Code Description Active Date MDM Diagnosis E11.621 Type 2 diabetes mellitus with foot ulcer 07/24/2022 No Yes L97.522 Non-pressure chronic ulcer of other part of left foot with fat layer exposed 07/24/2022 No Yes PRANAY, PANAS ([id number])  [id number] Page 4 of 6 E11.40 Type 2 diabetes mellitus with diabetic neuropathy, unspecified 07/24/2022 No Yes I10 Essential (primary) hypertension 07/24/2022 No Yes Inactive Problems Resolved Problems Electronic Signature(s) Signed: 10/09/2022 10:55:09 AM By: Allen Derry PA-C Entered By: Allen Derry on 10/09/2022 10:55:09 -------------------------------------------------------------------------------- Progress Note Details Patient Name: Date of Service: [name] 10/09/2022 10:45 A M Medical Record Number: [id number] Patient Account Number: [id number] Date of Birth/Sex: Treating RN: Jul 16, 1945 (77 y.o. [name] Primary Care Provider: [name] Other Clinician: [name] Referring Provider: Treating Provider/Extender: [name] in Treatment: 11 Subjective Chief Complaint Information obtained from Patient Left great toe ulcer History of Present Illness (HPI) 07-24-2022 upon evaluation today patient presents for initial inspection here in our clinic concerning issues that has been having with a wound on his left great toe. This is actually a circumferential issue which was precipitated by the fact that he has neuropathy. Subsequently he was trying to do what he could do to help alleviate some of his discomfort and what he noted was that pressure to the toe actually helped relieve some of the neuropathic pain. So we decided he was going to wrap his toe with tape and it felt good so he decided to leave it through the night. Unfortunately this was too much compression that actually cut off the blood flow to the toe and because a degloving injury to the toe pretty much from the base to the tip of the great toe. Unfortunately this has not completely healed yet but fortunately I do not think it is likely going to end up is something that he will lose but nonetheless this is still questionable at this point. The  patient states that he knows he should have done this and is hopefully learned his lesson he tells me that he has not been more careful and not do this type of thing in the future. Patient does have a history of diabetes mellitus type 2, diabetic neuropathy related to the diabetes  type 2, hypertension, and the neuropathy in his case is fairly severe. His most recent hemoglobin A1c was 6.5 that was on March 05, 2022 he does take a 325 mg aspirin. 07-31-2022 upon evaluation today patient appears to be doing well currently in regard to his wound. He has been tolerating the dressing changes without complication and in general I feel like that he is actually making some really good progress here but at the same time draining quite significantly. I think we need to do something to improve the drainage aspect here he is in agreement with that plan. 08-08-2022 upon evaluation today patient appears to be doing well currently in regard to his toe ulcer. He has been tolerating the dressing changes without complication. Fortunately there does not appear to be any signs of active infection locally or systemically which is great news. No fevers, chills, nausea, vomiting, or diarrhea. 08-18-2022 upon evaluation today patient appears to be doing well currently in regard to his big toe. He is going require some debridement today but in general seems to be making some good progress. Fortunately I do not see any signs of active infection locally or systemically. 08-25-2022 upon evaluation today patient appears to be doing well currently in regard to his wound. He is actually making good progress in regard to the toe without any signs of infection he seems to be really doing quite well. Fortunately I do not see any evidence of active infection locally nor systemically which is great news. No fevers, chills, nausea, vomiting, or diarrhea. 09-02-2022 upon evaluation today patient's toe actually appears to be making excellent  progress. Fortunately there does not appear to be any signs of infection locally nor systemically which is great news and overall I am extremely pleased with where we stand today. 09-11-2022 upon evaluation today patient appears to be doing well currently in regard to his wound. Has been tolerating the dressing changes without VINSON, SIMONETTI ([id number]) [id number] Page 5 of 6 complication and fortunately I do believe that we are moving in the right direction. I do not see any evidence of overall worsening and I think that he is actually making good improvement the measurements are smaller and we are improving to the point that I am extremely happy with the progress today. 09-18-2022 upon evaluation today patient appears to be doing well currently in regard to his wound. He is actually tolerating the dressing changes without complication. Fortunately there does not appear to be any evidence of active infection locally or systemically which is great news. No fevers, chills, nausea, vomiting, or diarrhea. 09-25-2022 upon evaluation today patient appears to be doing well currently in regard to his wound. He has been tolerating the dressing changes without complication and is making really good progress here. Fortunately I do not see any evidence of active infection locally nor systemically which is great news. No fevers, chills, nausea, vomiting, or diarrhea. 10-02-2022 upon evaluation patient's wound actually showing signs of excellent improvement and I am very pleased with where we stand today. I do not see any evidence of active infection at this time which is great news and I do believe that we are making excellent headway towards closure. He does seem to be a little bit red in regard to the toe compared to normal I am a little concerned about the possibility of cellulitis. 10-09-2022 upon evaluation today patient's wound actually on the toe appears to be completely  healed. I am actually very pleased with where things stand  I think that he has done excellent and in general I do think that we are moving in the right direction and I believe in fact he is completely healed. Objective Constitutional Well-nourished and well-hydrated in no acute distress. Vitals Time Taken: 10:48 AM, Height: 72 in, Weight: 253 lbs, BMI: 34.3, Temperature: 97.5 F, Pulse: 69 bpm, Respiratory Rate: 18 breaths/min, Blood Pressure: 161/98 mmHg. Respiratory normal breathing without difficulty. Psychiatric this patient is able to make decisions and demonstrates good insight into disease process. Alert and Oriented x 3. pleasant and cooperative. General Notes: Upon inspection patient's wound again showed complete epithelization I do not see any signs of worsening overall in general I do believe that he is making excellent progress towards complete resolution. Integumentary (Hair, Skin) Wound #1 status is Healed - Epithelialized. Original cause of wound was Shear/Friction. The date acquired was: 07/17/2022. The wound has been in treatment 11 weeks. The wound is located on the Left,Circumferential T Great. The wound measures 0cm length x 0cm width x 0cm depth; 0cm^2 area and 0cm^3 volume. oe There is Fat Layer (Subcutaneous Tissue) exposed. There is a none present amount of drainage noted. The wound margin is flat and intact. There is no granulation within the wound bed. There is a small (1-33%) amount of necrotic tissue within the wound bed. Assessment Active Problems ICD-10 Type 2 diabetes mellitus with foot ulcer Non-pressure chronic ulcer of other part of left foot with fat layer exposed Type 2 diabetes mellitus with diabetic neuropathy, unspecified Essential (primary) hypertension Plan Discharge From [facility] Services: Discharge from Wound Care Center Treatment Complete - Wear surgical shoe x 1 week then slowly transition into regular shoe apply AandD ointment then gauze for  protection to toe 1. Based on lab seeing I do believe that the patient is completely healed. This is great news. I would recommend AandD ointment be applied and this should be twice a day I think that the best way to go. 2. I am good recommend that he continue to monitor his toe daily to ensure nothing worsens obviously if he has any issues he should let me know. We will see patient back for reevaluation in 1 week here in the clinic. If anything worsens or changes patient will contact our office for additional recommendations. Electronic Signature(s) MCKENZIE, NUDD ([id number]) [id number] Page 6 of 6 Signed: 10/09/2022 6:25:30 PM By: Allen Derry PA-C Entered By: Allen Derry on 10/09/2022 18:25:30 -------------------------------------------------------------------------------- SuperBill Details Patient Name: Date of Service: [name] 10/09/2022 Medical Record Number: [id number] Patient Account Number: [id number] Date of Birth/Sex: Treating RN: 08-10-1945 (77 y.o. [name] Primary Care Provider: [name] Other Clinician: [name] Referring Provider: Treating Provider/Extender: [name] in Treatment: 11 Diagnosis Coding ICD-10 Codes Code Description E11.621 Type 2 diabetes mellitus with foot ulcer L97.522 Non-pressure chronic ulcer of other part of left foot with fat layer exposed E11.40 Type 2 diabetes mellitus with diabetic neuropathy, unspecified I10 Essential (primary) hypertension Facility Procedures : CPT4 Code: [id number] Description: [phone] - WOUND CARE VISIT-LEV 2 EST PT Modifier: Quantity: 1 Physician Procedures : CPT4 Code Description Modifier [id number] - WC PHYS LEVEL 3 - EST PT ICD-10 Diagnosis Description E11.621 Type 2 diabetes mellitus with foot ulcer L97.522 Non-pressure chronic ulcer of other part of left foot with fat layer exposed E11.40 Type 2  diabetes mellitus with  diabetic neuropathy, unspecified I10 Essential (primary) hypertension Quantity: 1 Electronic Signature(s) Signed: 10/09/2022 6:25:50 PM By: Allen Derry PA-C Entered By:  Allen Derry on 10/09/2022 18:25:49

## 2022-10-10 DIAGNOSIS — E11621 Type 2 diabetes mellitus with foot ulcer: Secondary | ICD-10-CM | POA: Diagnosis not present

## 2022-10-10 NOTE — Progress Notes (Signed)
Douglas [name] ([id number]) [id number] Page 1 of 9 Visit Report for 10/09/2022 Arrival Information Details Patient Name: Date of Service: Douglas [name], [name] 10/09/2022 10:45 A M Medical Record Number: [id number] Patient Account Number: [id number] Date of Birth/Sex: Treating RN: 03/31/1946 (77 y.o. Douglas [name] Primary Care Douglas [name]: Douglas [name] Other Clinician: [name] Referring Arnell Slivinski: Treating Braedyn Kauk/Extender: [name] in Treatment: 11 Visit Information History Since Last Visit All ordered tests and consults were completed: No Patient Arrived: Ambulatory Added or deleted any medications: No Arrival Time: 10:46 Any new allergies or adverse reactions: No Transfer Assistance: None Had a fall or experienced change in No Patient Identification Verified: Yes activities of daily living that may affect Secondary Verification Process Completed: Yes risk of falls: Patient Requires Transmission-Based Precautions: No Signs or symptoms of abuse/neglect since No Patient Has Alerts: Yes last visito Patient Alerts: Diabetes type 2 Hospitalized since last visit: No ASA 325mg  Implantable device outside of the clinic No excluding cellular tissue based products placed in the center since last visit: Has Dressing in Place as Prescribed: Yes Has Footwear/Offloading in Place as Yes Prescribed: Left: Surgical Shoe with Pressure Relief Insole Pain Present Now: Yes Electronic Signature(s) Signed: 10/10/2022 11:45:42 AM By: [name] Entered By: [name] on 10/09/2022 10:47:23 -------------------------------------------------------------------------------- Clinic Level of Care Assessment Details Patient Name: Date of Service: [name], [name] 10/09/2022 10:45 A M Medical Record Number: [id number] Patient Account Number: [id number] Date of Birth/Sex: Treating RN: 1945-11-14 (77 y.o. Douglas [name] Primary Care Bentlee Benningfield: Douglas [name] Other Clinician: [name] Referring Keyani Rigdon: Treating Dola Lunsford/Extender: [name] in Treatment: 11 Clinic Level of Care Assessment Items TOOL 4 Quantity Score Douglas [name] ([id number]) [id number] Page 2 of 9 []  - 0 Use when only an EandM is performed on FOLLOW-UP visit ASSESSMENTS - Nursing Assessment / Reassessment X- 1 10 Reassessment of Co-morbidities (includes updates in patient status) X- 1 5 Reassessment of Adherence to Treatment Plan ASSESSMENTS - Wound and Skin A ssessment / Reassessment X - Simple Wound Assessment / Reassessment - one wound 1 5 []  - 0 Complex Wound Assessment / Reassessment - multiple wounds []  - 0 Dermatologic / Skin Assessment (not related to wound area) ASSESSMENTS - Focused Assessment []  - 0 Circumferential Edema Measurements - multi extremities []  - 0 Nutritional Assessment / Counseling / Intervention []  - 0 Lower Extremity Assessment (monofilament, tuning fork, pulses) []  - 0 Peripheral Arterial Disease Assessment (using hand held doppler) ASSESSMENTS - Ostomy and/or Continence Assessment and Care []  - 0 Incontinence Assessment and Management []  - 0 Ostomy Care Assessment and Management (repouching, etc.) PROCESS - Coordination of Care X - Simple Patient / Family Education for ongoing care 1 15 []  - 0 Complex (extensive) Patient / Family Education for ongoing care []  - 0 Staff obtains Chiropractor, Records, T Results / Process Orders est []  - 0 Staff telephones HHA, Nursing Homes / Clarify orders / etc []  - 0 Routine Transfer to another Facility (non-emergent condition) []  - 0 Routine Hospital Admission (non-emergent condition) []  - 0 New Admissions / Manufacturing engineer / Ordering NPWT Apligraf, etc. , []  - 0 Emergency Hospital Admission (emergent condition) X- 1 10 Simple Discharge Coordination []  - 0 Complex  (extensive) Discharge Coordination PROCESS - Special Needs []  - 0 Pediatric / Minor Patient Management []  - 0 Isolation Patient Management []  - 0 Hearing / Language / Visual special needs []  - 0 Assessment of Community assistance (transportation, D/C planning,  etc.) []  - 0 Additional assistance / Altered mentation []  - 0 Support Surface(s) Assessment (bed, cushion, seat, etc.) INTERVENTIONS - Wound Cleansing / Measurement X - Simple Wound Cleansing - one wound 1 5 []  - 0 Complex Wound Cleansing - multiple wounds X- 1 5 Wound Imaging (photographs - any number of wounds) []  - 0 Wound Tracing (instead of photographs) []  - 0 Simple Wound Measurement - one wound []  - 0 Complex Wound Measurement - multiple wounds INTERVENTIONS - Wound Dressings X - Small Wound Dressing one or multiple wounds 1 10 []  - 0 Medium Wound Dressing one or multiple wounds []  - 0 Large Wound Dressing one or multiple wounds Douglas [name], Douglas [name] ([id number]) [id number] Page 3 of 9 []  - 0 Application of Medications - topical []  - 0 Application of Medications - injection INTERVENTIONS - Miscellaneous []  - 0 External ear exam []  - 0 Specimen Collection (cultures, biopsies, blood, body fluids, etc.) []  - 0 Specimen(s) / Culture(s) sent or taken to Lab for analysis []  - 0 Patient Transfer (multiple staff / Michiel Sites Lift / Similar devices) []  - 0 Simple Staple / Suture removal (25 or less) []  - 0 Complex Staple / Suture removal (26 or more) []  - 0 Hypo / Hyperglycemic Management (close monitor of Blood Glucose) []  - 0 Ankle / Brachial Index (ABI) - do not check if billed separately X- 1 5 Vital Signs Has the patient been seen at the hospital within the last three years: Yes Total Score: 70 Level Of Care: New/Established - Level 2 Electronic Signature(s) Signed: 10/10/2022 11:45:42 AM By: [name] Entered By: [name] on 10/09/2022  11:36:45 -------------------------------------------------------------------------------- Encounter Discharge Information Details Patient Name: Date of Service: Douglas Lime RD L. 10/09/2022 10:45 A M Medical Record Number: [id number] Patient Account Number: [id number] Date of Birth/Sex: Treating RN: 1945-10-12 (77 y.o. Douglas [name] Primary Care Primitivo Merkey: Douglas [name] Other Clinician: [name] Referring Jennalyn Cawley: Treating Ardon Franklin/Extender: [name] in Treatment: 11 Encounter Discharge Information Items Discharge Condition: Stable Ambulatory Status: Ambulatory Discharge Destination: Home Transportation: Private Auto Accompanied By: self Schedule Follow-up Appointment: No Clinical Summary of Care: Electronic Signature(s) Signed: 10/10/2022 11:45:42 AM By: [name] Entered By: [name] on 10/09/2022 11:43:20 [name] ([id number]) [id number] Page 4 of 9 -------------------------------------------------------------------------------- Lower Extremity Assessment Details Patient Name: Date of Service: Douglas [name], [name] 10/09/2022 10:45 A M Medical Record Number: [id number] Patient Account Number: [id number] Date of Birth/Sex: Treating RN: [birth year] (77 y.o. Douglas [name] Primary Care Etana Beets: Douglas [name] Other Clinician: [name] Referring Kristen Fromm: Treating Quintyn Dombek/Extender: [name] in Treatment: 11 Edema Assessment Assessed: Kyra Searles: Yes] [name]: No] Edema: [Left: N] [Right: o] Calf Left: Right: Point of Measurement: 34 cm From Medial Instep 38 cm Ankle Left: Right: Point of Measurement: 9 cm From Medial Instep 24.5 cm Vascular Assessment Pulses: Dorsalis Pedis Palpable: [Left:Yes] Electronic Signature(s) Signed: 10/09/2022 5:12:33 PM By: Angelina Pih Signed: 10/10/2022 11:45:42 AM By: [name] Entered By: [name] on  10/09/2022 10:55:36 -------------------------------------------------------------------------------- Multi Wound Chart Details Patient Name: Date of Service: Douglas Lime RD L. 10/09/2022 10:45 A M Medical Record Number: [id number] Patient Account Number: [id number] Date of Birth/Sex: Treating RN: 01-10-1946 (77 y.o. Douglas [name] Primary Care Charmagne Buhl: Douglas [name] Other Clinician: [name] Referring Sadiel Mota: Treating Abass Misener/Extender: [name] in Treatment: 11 Vital Signs Height(in): 72 Pulse(bpm): 69 Weight(lbs): 253 Blood Pressure(mmHg): 161/98 Body Mass Index(BMI): 34.3 Temperature(F): 97.5 Respiratory Rate(breaths/min): 18 Heatwole, Raidyn L (  [id number]) [id number] Page 5 of 9 [1:Photos:] [N/A:N/A] Left, Circumferential T Great oe N/A N/A Wound Location: Shear/Friction N/A N/A Wounding Event: Diabetic Wound/Ulcer of the Lower N/A N/A Primary Etiology: Extremity Sleep Apnea, Hypertension, Type II N/A N/A Comorbid History: Diabetes, Neuropathy 07/17/2022 N/A N/A Date Acquired: 11 N/A N/A Weeks of Treatment: Open N/A N/A Wound Status: No N/A N/A Wound Recurrence: Yes N/A N/A Pending A mputation on Presentation: 0.1x0.1x0.1 N/A N/A Measurements L x W x D (cm) 0.008 N/A N/A A (cm) : rea 0.001 N/A N/A Volume (cm) : 100.00% N/A N/A % Reduction in A rea: 100.00% N/A N/A % Reduction in Volume: Grade 1 N/A N/A Classification: Medium N/A N/A Exudate A mount: Serosanguineous N/A N/A Exudate Type: red, brown N/A N/A Exudate Color: Flat and Intact N/A N/A Wound Margin: Medium (34-66%) N/A N/A Granulation A mount: Red, Pink N/A N/A Granulation Quality: Small (1-33%) N/A N/A Necrotic A mount: Fat Layer (Subcutaneous Tissue): Yes N/A N/A Exposed Structures: Fascia: No Tendon: No Muscle: No Joint: No Bone: No None N/A N/A Epithelialization: Treatment Notes Electronic  Signature(s) Signed: 10/10/2022 11:45:42 AM By: [name] Entered By: [name] on 10/09/2022 10:55:42 -------------------------------------------------------------------------------- Multi-Disciplinary Care Plan Details Patient Name: Date of Service: Douglas Lime RD L. 10/09/2022 10:45 A M Medical Record Number: [id number] Patient Account Number: [id number] Date of Birth/Sex: Treating RN: 1945/05/29 (77 y.o. Douglas [name] Primary Care Shaindel Sweeten: Douglas [name] Other Clinician: [name] Referring Alexia Dinger: Treating Ginamarie Banfield/Extender: [name] in Treatment: [address] CEPHUS, BIESCHKE ([id number]) [id number] Page 6 of 9 Electronic Signature(s) Signed: 10/09/2022 5:12:33 PM By: Angelina Pih Signed: 10/10/2022 11:45:42 AM By: [name] Entered By: [name] on 10/09/2022 11:42:44 -------------------------------------------------------------------------------- Pain Assessment Details Patient Name: Date of Service: Douglas Lime RD L. 10/09/2022 10:45 A M Medical Record Number: [id number] Patient Account Number: [id number] Date of Birth/Sex: Treating RN: [birth year] (77 y.o. Douglas [name] Primary Care Lakeithia Rasor: Douglas [name] Other Clinician: [name] Referring Nate Common: Treating Travante Knee/Extender: [name] in Treatment: 11 Active Problems Location of Pain Severity and Description of Pain Patient Has Paino Yes Site Locations Pain Location: Generalized Pain, Pain in Ulcers Duration of the Pain. Constant / Intermittento Constant Rate the pain. Current Pain Level: 3 Character of Pain Describe the Pain: Burning, Other: burning Pain Management and Medication Current Pain Management: Medication: Yes Cold Application: No Rest: No Massage: No Activity: No T.E.N.S.: No Heat Application: No Leg drop or elevation: No Is the Current Pain  Management Adequate: Inadequate How does your wound impact your activities of daily livingo Sleep: No Bathing: No Appetite: No Relationship With Others: No Bladder Continence: No Emotions: No Bowel Continence: No Work: No Toileting: No Drive: No Dressing: No Hobbies: No Electronic Signature(s) Signed: 10/09/2022 5:12:33 PM By: Angelina Pih Signed: 10/10/2022 11:45:42 AM By: Astrid Divine [phone] [id number].pdf Page 7 of 9 Signed: 10/10/2022 11:45:42 AM Entered By: [name] on 10/09/2022 10:50:27 -------------------------------------------------------------------------------- Patient/Caregiver Education Details Patient Name: Date of Service: Douglas Lime RD L. 6/27/2024andnbsp10:45 A M Medical Record Number: [id number] Patient Account Number: [id number] Date of Birth/Gender: Treating RN: 12-02-1945 (77 y.o. Douglas [name] Primary Care Physician: Douglas [name] Other Clinician: [name] Referring Physician: Treating Physician/Extender: [name] in Treatment: 11 Education Assessment Education Provided To: Patient Education Topics Provided Wound/Skin Impairment: Handouts: Other: Your wound has healed. Please call if any further issues arise Methods: Explain/Verbal Responses: State content correctly Electronic Signature(s) Signed: 10/10/2022 11:45:42  AM By: [name] Entered By: [name] on 10/09/2022 11:42:14 -------------------------------------------------------------------------------- Wound Assessment Details Patient Name: Date of Service: Douglas [name], [name] 10/09/2022 10:45 A M Medical Record Number: [id number] Patient Account Number: [id number] Date of Birth/Sex: Treating RN: March 29, 1946 (77 y.o. Douglas [name] Primary Care Saabir Blyth: Douglas [name] Other Clinician: [name] Referring Zaylee Cornia: Treating Javarus Dorner/Extender: [name] in Treatment: 11 Wound Status Wound Number: 1 Primary Etiology: Diabetic Wound/Ulcer of the Lower Extremity Wound Location: Left, Circumferential T Great oe Wound Status: Healed - Epithelialized Wounding Event: Shear/Friction Comorbid History: Sleep Apnea, Hypertension, Type II Diabetes, Neuropathy Date Acquired: 07/17/2022 Weeks Of Treatment: 11 Clustered Wound: No Pending Amputation On Presentation Douglas [name], Douglas [name] ([id number]) [id number] Page 8 of 9 Photos Wound Measurements Length: (cm) Width: (cm) Depth: (cm) Area: (cm) Volume: (cm) 0 % Reduction in Area: 100% 0 % Reduction in Volume: 100% 0 Epithelialization: Large (67-100%) 0 0 Wound Description Classification: Grade 1 Wound Margin: Flat and Intact Exudate Amount: None Present Foul Odor After Cleansing: No Slough/Fibrino No Wound Bed Granulation Amount: None Present (0%) Exposed Structure Necrotic Amount: Small (1-33%) Fascia Exposed: No Fat Layer (Subcutaneous Tissue) Exposed: Yes Tendon Exposed: No Muscle Exposed: No Joint Exposed: No Bone Exposed: No Treatment Notes Wound #1 (Toe Great) Wound Laterality: Left, Circumferential Cleanser Peri-Wound Care Topical Primary Dressing Secondary Dressing Secured With Compression Wrap Compression Stockings Add-Ons Electronic Signature(s) Signed: 10/09/2022 5:12:33 PM By: Angelina Pih Signed: 10/10/2022 11:45:42 AM By: [name] Entered By: [name] on 10/09/2022 11:33:28 [name] ([id number]) [id number] Page 9 of 9 -------------------------------------------------------------------------------- Vitals Details Patient Name: Date of Service: Douglas [name], [name] 10/09/2022 10:45 A M Medical Record Number: [id number] Patient Account Number: [id number] Date of Birth/Sex: Treating RN: [birth year] (77 y.o. Douglas [name] Primary Care Eilyn Polack:  Douglas [name] Other Clinician: [name] Referring Dierdra Salameh: Treating Keahi Mccarney/Extender: [name] in Treatment: 11 Vital Signs Time Taken: 10:48 Temperature (F): 97.5 Height (in): 72 Pulse (bpm): 69 Weight (lbs): 253 Respiratory Rate (breaths/min): 18 Body Mass Index (BMI): 34.3 Blood Pressure (mmHg): 161/98 Reference Range: 80 - 120 mg / dl Electronic Signature(s) Signed: 10/10/2022 11:45:42 AM By: [name] Entered By: [name] on 10/09/2022 10:50:17

## 2022-10-20 ENCOUNTER — Ambulatory Visit: Payer: PPO | Admitting: Family Medicine

## 2022-10-21 DIAGNOSIS — H16223 Keratoconjunctivitis sicca, not specified as Sjogren's, bilateral: Secondary | ICD-10-CM | POA: Diagnosis not present

## 2022-10-23 ENCOUNTER — Ambulatory Visit: Payer: PPO | Admitting: Physician Assistant

## 2022-11-03 ENCOUNTER — Encounter: Payer: Self-pay | Admitting: Orthopedic Surgery

## 2022-11-03 ENCOUNTER — Ambulatory Visit: Payer: PPO | Admitting: Orthopedic Surgery

## 2022-11-03 DIAGNOSIS — M6702 Short Achilles tendon (acquired), left ankle: Secondary | ICD-10-CM

## 2022-11-03 DIAGNOSIS — M6701 Short Achilles tendon (acquired), right ankle: Secondary | ICD-10-CM

## 2022-11-03 DIAGNOSIS — M216X9 Other acquired deformities of unspecified foot: Secondary | ICD-10-CM

## 2022-11-03 NOTE — Progress Notes (Signed)
Office Visit Note   Patient: Douglas [name]           Date of Birth: 1945/11/08           MRN: [id number] Visit Date: 11/03/2022              Requested by: Glori Luis, MD [address] [place],  [place] [address] PCP: Glori Luis, MD  Chief Complaint  Patient presents with   Right Foot - Follow-up   Left Foot - Follow-up      HPI: Patient is a 77 year old gentleman with cavovarus deformity to both feet.  Achilles contracture bilaterally.  Patient complains of burning pain across the forefoot with history of ulceration.  Patient is also had custom orthotics modified to support the foot and offload the medial column without relief.  Assessment & Plan: Visit Diagnoses:  1. Acquired cavovarus deformity of foot, unspecified laterality   2. Achilles tendon contracture, bilateral     Plan: Due to failure can prolonged conservative treatment and persistent pain patient would like to proceed with surgical intervention.  Will plan for gastrocnemius recession on the left and a closing dorsal wedge osteotomy of the first metatarsal on the left.  Patient recently has had a wound healing over the great toe and we may need to evaluate the EHL tendon that currently has some cystic changes.  Follow-Up Instructions: No follow-ups on file.   Ortho Exam  Patient is alert, oriented, no adenopathy, well-dressed, normal affect, normal respiratory effort. Examination the ulcer on the great toe is healed there is some cystic changes over the EHL tendon.  Patient has numbness and burning across the forefoot with cavovarus deformity both feet with neutral calcaneus.  Patient has a good dorsalis pedis pulse bilaterally.  Neuropathic pain secondary to pressure from the Achilles contracture and cavovarus deformity.  Dorsiflexion of the ankle only to neutral.  Imaging: No results found. No images are attached to the encounter.  Labs: Lab Results  Component Value Date    HGBA1C 6.5 03/05/2022   HGBA1C 6.5 05/14/2021   HGBA1C 6.6 (H) 12/19/2020   LABURIC 6.7 03/05/2022   LABURIC 5.0 11/12/2018   LABURIC 4.9 11/17/2016     Lab Results  Component Value Date   ALBUMIN 4.1 03/05/2022   ALBUMIN 3.9 12/19/2020   ALBUMIN 4.1 11/23/2019    No results found for: "MG" Lab Results  Component Value Date   VD25OH 83 05/02/2013    No results found for: "PREALBUMIN"    Latest Ref Rng & Units 03/05/2022   11:30 AM 01/19/2019    7:20 PM 09/10/2017    5:47 PM  CBC EXTENDED  WBC 4.0 - 10.5 K/uL 7.0  10.6  9.5   RBC 4.22 - 5.81 Mil/uL 4.79  4.10  4.10   Hemoglobin 13.0 - 17.0 g/dL 19.1  47.8  29.5   HCT 39.0 - 52.0 % 38.3  36.6  36.0   Platelets 150.0 - 400.0 K/uL 224.0  258  210   NEUT# 1.4 - 7.7 K/uL 3.8     Lymph# 0.7 - 4.0 K/uL 2.0        There is no height or weight on file to calculate BMI.  Orders:  No orders of the defined types were placed in this encounter.  No orders of the defined types were placed in this encounter.    Procedures: No procedures performed  Clinical Data: No additional findings.  ROS:  All other systems negative,  except as noted in the HPI. Review of Systems  Objective: Vital Signs: There were no vitals taken for this visit.  Specialty Comments:  No specialty comments available.  PMFS History: Patient Active Problem List   Diagnosis Date Noted   Blister (nonthermal), left great toe, initial encounter 07/21/2022   Iron deficiency 04/17/2022   Pain of great toe 04/17/2022   Erectile dysfunction 03/05/2022   Tendinopathy of left rotator cuff 11/18/2021   Toe infection 08/12/2021   Abnormal results of liver function studies 08/10/2020   Esophageal reflux 08/10/2020   Low back pain 08/10/2020   Other testicular hypofunction 08/10/2020   Restless legs syndrome 08/10/2020   Skin exam, screening for cancer 03/02/2020   Anemia in chronic kidney disease 02/18/2019   Secondary hyperparathyroidism of renal  origin (HCC) 02/18/2019   Right kidney mass 01/20/2019   Erectile dysfunction due to arterial insufficiency 11/09/2018   Respiratory illness 11/05/2018   Tick bite of knee, initial encounter 08/17/2018   Left shoulder pain 07/02/2018   Allergic rhinitis 07/02/2018   Cervical radiculopathy 11/20/2017   Toe pain, bilateral 11/20/2017   Abdominal pain 02/18/2017   Clavicle enlargement 02/18/2017   Anxiety 02/18/2017   Degenerative arthritis of knee, bilateral 09/04/2016   BPH (benign prostatic hyperplasia) 08/18/2016   Knee osteoarthritis 08/18/2016   Hyperlipidemia 05/20/2016   Allergic conjunctivitis 08/30/2015   Essential hypertension 05/31/2015   Low testosterone 02/27/2015   Skin lesion 11/24/2014   Diabetic neuropathy (HCC) 01/05/2013   Obesity (BMI 30-39.9) 01/05/2013   Chronic back pain greater than 3 months duration 01/05/2013   Gout 01/05/2013   OSA (obstructive sleep apnea) 01/05/2013   Chronic kidney disease 01/04/2013   DM type 2 with diabetic peripheral neuropathy (HCC) 01/04/2013   Hypothyroidism 01/04/2013   Past Medical History:  Diagnosis Date   Arthritis    Depression    Diabetes mellitus with complication (HCC)    Diabetic neuropathy (HCC)    Diastolic dysfunction    a. TTE 07/2017: EF 60-65%, mild concentric LVH, no RWMA, Gr1DD, trivial AI, mildly dilated LA, RVSF normal, PASP normal   Edema    feet/legs   GERD (gastroesophageal reflux disease)    Gout    History of stress test    a. MV 06/2017: small in size, mild in severity, apical anterior and apical defect that was minimally reversible and most likely represented artifact and less likely ischemia/scar, LVEF 55-65%, low risk, probably normal stress test   Hypertension    Hypothyroidism    Kidney cysts    renal failure 2013   Kidney stones    Dr. Artis Flock   OSA (obstructive sleep apnea)    supplemental oxygen at night   Oxygen dependent    hs   [facility] spotted fever 12/10/2017   Positve IgG  in titer on 11/20/17   S/P cardiac cath 1998   a. no obstructive disease   Syncope and collapse    Temporary low platelet count (HCC)     Family History  Problem Relation Age of Onset   Breast cancer Mother    Lung cancer Mother    Bone cancer Mother    Heart Problems Mother    Cancer Mother        breast, lung and rib   AAA (abdominal aortic aneurysm) Mother    Heart disease Mother    Heart attack Father    Heart disease Father    Cancer Brother        esophageal  Heart attack Brother    Colon cancer Neg Hx     Past Surgical History:  Procedure Laterality Date   ANAL FISSURE REPAIR     BACK SURGERY  1977   rupture disc lumbar spine   CARDIAC CATHETERIZATION  1998   [facility].    CATARACT EXTRACTION W/PHACO Right 07/16/2016   Procedure: CATARACT EXTRACTION PHACO AND INTRAOCULAR LENS PLACEMENT (IOC);  Surgeon: Sallee Lange, MD;  Location: [facility] ORS;  Service: Ophthalmology;  Laterality: Right;  [place] 01:11 AP% 17.6 CDE 24.83 fluid pack lot # [id number]   CATARACT EXTRACTION W/PHACO Left 08/13/2016   Procedure: CATARACT EXTRACTION PHACO AND INTRAOCULAR LENS PLACEMENT (IOC) suture placed in left eye at end of procedure;  Surgeon: Sallee Lange, MD;  Location: [facility] ORS;  Service: Ophthalmology;  Laterality: Left;  [place] 01:55 AP% 22.7 CDE 53.56 fluid pack lot # [id number]   FINGER AMPUTATION     partial   KNEE SURGERY  1993   arthroscopy   SHOULDER SURGERY Bilateral 1998   arthroscopic right, rotator cuff repair left   Social History   Occupational History    Comment: retired  Tobacco Use   Smoking status: Never   Smokeless tobacco: Never  Vaping Use   Vaping status: Never Used  Substance and Sexual Activity   Alcohol use: No   Drug use: No   Sexual activity: Yes

## 2022-11-04 ENCOUNTER — Other Ambulatory Visit: Payer: Self-pay | Admitting: Family Medicine

## 2022-11-04 DIAGNOSIS — E1165 Type 2 diabetes mellitus with hyperglycemia: Secondary | ICD-10-CM

## 2022-11-14 ENCOUNTER — Telehealth: Payer: Self-pay

## 2022-11-14 NOTE — Telephone Encounter (Signed)
Called and spoke with Fara Boros, pt's wife.  Let her know that Ozempic 4mg /94ml had arrived and was ready to be picked up.  Ozempic x 4 boxes.

## 2022-11-17 NOTE — Telephone Encounter (Signed)
Patient's wife [place] came and picked up the Patient Assistance Medication Ozempic -4 boxes.

## 2022-12-16 DIAGNOSIS — I10 Essential (primary) hypertension: Secondary | ICD-10-CM | POA: Diagnosis not present

## 2022-12-16 DIAGNOSIS — R600 Localized edema: Secondary | ICD-10-CM | POA: Diagnosis not present

## 2022-12-16 DIAGNOSIS — D631 Anemia in chronic kidney disease: Secondary | ICD-10-CM | POA: Diagnosis not present

## 2022-12-16 DIAGNOSIS — E1122 Type 2 diabetes mellitus with diabetic chronic kidney disease: Secondary | ICD-10-CM | POA: Diagnosis not present

## 2022-12-16 DIAGNOSIS — N1831 Chronic kidney disease, stage 3a: Secondary | ICD-10-CM | POA: Diagnosis not present

## 2022-12-16 DIAGNOSIS — N2581 Secondary hyperparathyroidism of renal origin: Secondary | ICD-10-CM | POA: Diagnosis not present

## 2022-12-17 ENCOUNTER — Encounter: Payer: Self-pay | Admitting: Pharmacist

## 2022-12-17 ENCOUNTER — Other Ambulatory Visit: Payer: Self-pay | Admitting: Pharmacist

## 2022-12-17 DIAGNOSIS — E1165 Type 2 diabetes mellitus with hyperglycemia: Secondary | ICD-10-CM

## 2022-12-17 MED ORDER — METFORMIN HCL ER 500 MG PO TB24
500.0000 mg | ORAL_TABLET | Freq: Two times a day (BID) | ORAL | 3 refills | Status: DC
Start: 2022-12-17 — End: 2022-12-31

## 2022-12-17 NOTE — Progress Notes (Signed)
Pharmacy Quality Measure Review  This patient is appearing on a report for being at risk of failing the adherence measure for cholesterol (statin) and diabetes medications this calendar year.   Medication: simvastatin 40 mg Last fill date: 90 for 09/25/22 day supply  No action needed at this time.   Medication: metformin XR 500 mg  Last fill date: 4/12 for 90 day supply  Spoke with patient. He notes he was previously told to take metformin XR 500 mg twice daily, not 2 tablets twice daily. He continues [place], [place], [place]. Given last eGFR <45, appropriate to continue this dose.   Will update metformin script. Assisted patient in scheduling follow up with PCP.   Catie Eppie Gibson, PharmD, BCACP, CPP Clinical Pharmacist [facility] [phone]

## 2022-12-17 NOTE — Progress Notes (Signed)
Error

## 2022-12-22 DIAGNOSIS — H16223 Keratoconjunctivitis sicca, not specified as Sjogren's, bilateral: Secondary | ICD-10-CM | POA: Diagnosis not present

## 2022-12-31 ENCOUNTER — Encounter: Payer: Self-pay | Admitting: Family Medicine

## 2022-12-31 ENCOUNTER — Ambulatory Visit (INDEPENDENT_AMBULATORY_CARE_PROVIDER_SITE_OTHER): Payer: PPO | Admitting: Family Medicine

## 2022-12-31 VITALS — BP 124/78 | HR 72 | Temp 98.0°F | Ht 72.0 in | Wt 257.0 lb

## 2022-12-31 DIAGNOSIS — F419 Anxiety disorder, unspecified: Secondary | ICD-10-CM | POA: Diagnosis not present

## 2022-12-31 DIAGNOSIS — R413 Other amnesia: Secondary | ICD-10-CM | POA: Diagnosis not present

## 2022-12-31 DIAGNOSIS — I10 Essential (primary) hypertension: Secondary | ICD-10-CM | POA: Diagnosis not present

## 2022-12-31 DIAGNOSIS — E039 Hypothyroidism, unspecified: Secondary | ICD-10-CM

## 2022-12-31 DIAGNOSIS — R143 Flatulence: Secondary | ICD-10-CM | POA: Diagnosis not present

## 2022-12-31 DIAGNOSIS — F32A Depression, unspecified: Secondary | ICD-10-CM | POA: Diagnosis not present

## 2022-12-31 DIAGNOSIS — E1142 Type 2 diabetes mellitus with diabetic polyneuropathy: Secondary | ICD-10-CM | POA: Diagnosis not present

## 2022-12-31 DIAGNOSIS — R55 Syncope and collapse: Secondary | ICD-10-CM | POA: Diagnosis not present

## 2022-12-31 DIAGNOSIS — L089 Local infection of the skin and subcutaneous tissue, unspecified: Secondary | ICD-10-CM | POA: Diagnosis not present

## 2022-12-31 DIAGNOSIS — Z7985 Long-term (current) use of injectable non-insulin antidiabetic drugs: Secondary | ICD-10-CM

## 2022-12-31 DIAGNOSIS — Z7984 Long term (current) use of oral hypoglycemic drugs: Secondary | ICD-10-CM

## 2022-12-31 LAB — COMPREHENSIVE METABOLIC PANEL WITH GFR
ALT: 19 U/L (ref 0–53)
AST: 20 U/L (ref 0–37)
Albumin: 4.1 g/dL (ref 3.5–5.2)
Alkaline Phosphatase: 75 U/L (ref 39–117)
BUN: 16 mg/dL (ref 6–23)
CO2: 32 meq/L (ref 19–32)
Calcium: 9.5 mg/dL (ref 8.4–10.5)
Chloride: 99 meq/L (ref 96–112)
Creatinine, Ser: 1.74 mg/dL — ABNORMAL HIGH (ref 0.40–1.50)
GFR: 37.32 mL/min — ABNORMAL LOW (ref >60.00)
Glucose, Bld: 106 mg/dL — ABNORMAL HIGH (ref 70–99)
Potassium: 4 meq/L (ref 3.5–5.1)
Sodium: 139 meq/L (ref 135–145)
Total Bilirubin: 1 mg/dL (ref 0.2–1.2)
Total Protein: 7.1 g/dL (ref 6.0–8.3)

## 2022-12-31 LAB — CBC
HCT: 41.6 % (ref 39.0–52.0)
Hemoglobin: 13 g/dL (ref 13.0–17.0)
MCHC: 31.3 g/dL (ref 30.0–36.0)
MCV: 85.5 fl (ref 78.0–100.0)
Platelets: 232 10*3/uL (ref 150.0–400.0)
RBC: 4.87 Mil/uL (ref 4.22–5.81)
RDW: 18.3 % — ABNORMAL HIGH (ref 11.5–15.5)
WBC: 10.3 10*3/uL (ref 4.0–10.5)

## 2022-12-31 LAB — VITAMIN B12: Vitamin B-12: 252 pg/mL (ref 211–911)

## 2022-12-31 LAB — TSH: TSH: 3.92 u[IU]/mL (ref 0.35–5.50)

## 2022-12-31 LAB — HEMOGLOBIN A1C: Hgb A1c MFr Bld: 7.8 % — ABNORMAL HIGH (ref 4.6–6.5)

## 2022-12-31 MED ORDER — DOXAZOSIN MESYLATE 4 MG PO TABS
ORAL_TABLET | ORAL | 3 refills | Status: DC
Start: 2022-12-31 — End: 2022-12-31

## 2022-12-31 MED ORDER — CARVEDILOL 6.25 MG PO TABS: ORAL_TABLET | ORAL | 2 refills | Status: DC

## 2022-12-31 MED ORDER — LEVOTHYROXINE SODIUM 100 MCG PO TABS
ORAL_TABLET | ORAL | 1 refills | Status: DC
Start: 2022-12-31 — End: 2023-08-21

## 2022-12-31 MED ORDER — FUROSEMIDE 40 MG PO TABS: ORAL_TABLET | ORAL | 1 refills | Status: DC

## 2022-12-31 MED ORDER — SIMVASTATIN 40 MG PO TABS: ORAL_TABLET | ORAL | 3 refills | Status: DC

## 2022-12-31 MED ORDER — CALCITRIOL 0.25 MCG PO CAPS: 0.2500 ug | ORAL_CAPSULE | Freq: Every day | ORAL | 5 refills | Status: DC

## 2022-12-31 MED ORDER — DOXAZOSIN MESYLATE 2 MG PO TABS
ORAL_TABLET | ORAL | 1 refills | Status: DC
Start: 2022-12-31 — End: 2023-02-12

## 2022-12-31 MED ORDER — SULFAMETHOXAZOLE-TRIMETHOPRIM 800-160 MG PO TABS
1.0000 | ORAL_TABLET | Freq: Two times a day (BID) | ORAL | 0 refills | Status: DC
Start: 2022-12-31 — End: 2023-07-01

## 2022-12-31 NOTE — Assessment & Plan Note (Signed)
Chronic issue.  Adequately controlled though patient did have what seems to be orthostatic syncope.  We will reduce his Cardura dose to 2 mg daily.  He will continue carvedilol 6.25 mg twice daily and Lasix 40 mg daily.

## 2022-12-31 NOTE — Assessment & Plan Note (Addendum)
Chronic issue.  Check A1c.  Discontinue metformin given GI side effects.  Patient will continue [name] 44 units daily, [name] 10 mg daily, and Ozempic 1 mg weekly.  Patient will let us know if GI symptoms do not improve off of metformin.

## 2022-12-31 NOTE — Patient Instructions (Addendum)
Nice to see you. We are going to treat your toe infection with Bactrim.  If you develop diarrhea on this please let us know immediately.  If you have spreading redness, increasing pain, drainage of pus, or any new or worsening symptoms regarding your toe please let us know immediately.  Please contact your podiatrist to schedule an appointment. If you pass out again please go to the emergency department.  Please make sure you are adequately hydrated. We are going to stop your metformin to see if this helps with your flatulence.

## 2022-12-31 NOTE — Assessment & Plan Note (Signed)
Chronic issue.  Stable.  Patient will continue Lyrica 200 mg twice daily and as needed use of Vicodin.  Urine drug screen completed today.

## 2022-12-31 NOTE — Assessment & Plan Note (Signed)
Recent onset issue.  Check B12 and TSH.

## 2022-12-31 NOTE — Progress Notes (Signed)
Douglas Alar, MD Phone: [phone]  Douglas [name] is a 77 y.o. male who presents today for f/u.  DIABETES Disease Monitoring: Blood Sugar ranges-not checking Polyuria/phagia/dipsia- no      Optho- reports up to date Medications: Compliance- taking farxiga, tresiba, metformin, ozempic Hypoglycemic symptoms- no  Neuropathy: Patient ports this is stable.  Notes his symptoms are rough at times.  He is on Lyrica.  He occasionally takes hydrocodone.  He also uses Salonpas.  Memory difficulty: Patient notes recent issues forgetting names and difficulty completing sentences at times.  Notes some depression though this is pretty stable.  He is on Cymbalta for that.  No SI.  Left great toe ingrown toenail: Patient reports this is a chronic intermittent issue.  The area has heat and has been more swollen.  Notes wound care gave him an antibiotic for this (doxycycline) which was helpful though notes it did not resolve the issue.  He does see podiatry though has not seen them since earlier this year and notes they did not address the ingrown toenails.  Flatulence: Patient notes this has increased over the last 3 to 4 months.  He notes no diarrhea.  Notes occasional hard stools.  No abdominal pain.  Syncope: Patient reports on Sunday he had a syncopal episode after standing up and getting lightheaded.  He subsequently passed out while he was at church.  He notes no palpitations.  No chest pain.  Has not recurred.  He does report intermittent issues with lightheadedness.   Social History   Tobacco Use  Smoking Status Never  Smokeless Tobacco Never    Current Outpatient Medications on File Prior to Visit  Medication Sig Dispense Refill   acetaminophen (TYLENOL) 500 MG tablet Take 500 mg by mouth every 6 (six) hours as needed.     aspirin 81 MG tablet Take 1 tablet (81 mg total) by mouth daily.     B Complex Vitamins (BL VITAMIN B COMPLEX PO) Take by mouth daily.     Cholecalciferol  (VITAMIN D3 PO) Take by mouth daily.     clotrimazole-betamethasone (LOTRISONE) cream Apply 1 application. topically 2 (two) times daily. 45 g 1   clove oil liquid Apply 1 application topically as needed.     cyclobenzaprine (FLEXERIL) 10 MG tablet Take 1 tablet by mouth as needed.     dapagliflozin propanediol ([name]) 10 MG TABS tablet Take 1 tablet (10 mg total) by mouth daily. 90 tablet 3   DULoxetine (CYMBALTA) 60 MG capsule TAKE 1 CAPSULE(60 MG) BY MOUTH DAILY 90 capsule 3   esomeprazole (NEXIUM) 40 MG capsule Take 1 capsule (40 mg total) by mouth daily at 12 noon. 90 capsule 3   ferrous sulfate 325 (65 FE) MG tablet Take 1 tablet (325 mg total) by mouth daily with breakfast. Please dispense dye free formulation. 90 tablet 1   finasteride (PROSCAR) 5 MG tablet TAKE 1 TABLET(5 MG) BY MOUTH AT BEDTIME 90 tablet 3   gentamicin cream (GARAMYCIN) 0.1 % Apply 1 application. topically 2 (two) times daily. 30 g 1   HYDROcodone-acetaminophen (NORCO/VICODIN) 5-325 MG tablet Take 1 tablet by mouth every 6 (six) hours as needed for moderate pain. 30 tablet 0   insulin degludec ([name] FLEXTOUCH) 200 UNIT/ML FlexTouch Pen Inject 40 Units into the skin daily. 15 mL 3   Insulin Pen Needle (NOVOFINE PEN NEEDLE) 32G X 6 MM MISC 1 each by Does not apply route 2 (two) times daily. 1 each 3   loratadine (CLARITIN)  10 MG tablet Take 10 mg by mouth daily as needed for allergies.     losartan (COZAAR) 25 MG tablet TAKE 1 TABLET BY MOUTH EVERY MORNING THEN TAKE 2 TABLETS BY MOUTH EVERY EVENING 270 tablet 3   MAGNESIUM PO Take by mouth daily at 8 pm.     meloxicam (MOBIC) 15 MG tablet Take 1 tablet (15 mg total) by mouth daily. 30 tablet 1   mometasone (NASONEX) 50 MCG/ACT nasal spray Place 2 sprays into the nose daily. 1 each 12   Multiple Vitamins-Minerals (MULTIVITAMIN MEN PO) Take 1 tablet by mouth daily.     Needle, Disp, (HYPODERMIC NEEDLE 18GX1") 18G X 1" MISC Use every 14 days to draw up testosterone      Needle, Disp, (HYPODERMIC NEEDLE 25GX1") 25G X 1" MISC Use every 14 days to administer testosterone     pregabalin (LYRICA) 200 MG capsule TAKE 1 CAPSULE(200 MG) BY MOUTH TWICE DAILY 180 capsule 1   Semaglutide, 1 MG/DOSE, (OZEMPIC, 1 MG/DOSE,) 4 MG/3ML SOPN Inject 1 mg into the skin once a week. 3 mL 3   tadalafil (CIALIS) 20 MG tablet Take by mouth.     testosterone cypionate (DEPOTESTOSTERONE CYPIONATE) 200 MG/ML injection Inject 200 mg into the muscle every 14 (fourteen) days.     No current facility-administered medications on file prior to visit.     ROS see history of present illness  Objective  Physical Exam Vitals:   12/31/22 1005  BP: 124/78  Pulse: 72  Temp: 98 F (36.7 C)  SpO2: 96%   Lying blood pressure 154/92 pulse 69 Sitting blood pressure 146/88 pulse 72 Standing blood pressure 138/86 pulse 69  BP Readings from Last 3 Encounters:  12/31/22 124/78  07/21/22 122/76  04/17/22 110/70   Wt Readings from Last 3 Encounters:  12/31/22 257 lb (116.6 kg)  08/19/22 255 lb (115.7 kg)  07/21/22 257 lb 3.2 oz (116.7 kg)    Physical Exam Constitutional:      General: He is not in acute distress.    Appearance: He is not diaphoretic.  Cardiovascular:     Rate and Rhythm: Normal rate and regular rhythm.     Heart sounds: Normal heart sounds.     Comments: 2+ DP and PT pulses Pulmonary:     Effort: Pulmonary effort is normal.     Breath sounds: Normal breath sounds.  Skin:    General: Skin is warm and dry.  Neurological:     Mental Status: He is alert.    EKG: Sinus versus atrial rhythm, P waves are identified, first-degree AV block, nonspecific T wave changes appear stable  Assessment/Plan: Please see individual problem list.  Syncope, unspecified syncope type Assessment & Plan: Suspect orthostatic syncope given his description of symptoms.  EKG completed today.  We will reduce his Cardura to 2 mg daily and see if that helps limit his lightheadedness.  If  he has recurrent syncope he was advised to go to the emergency department.  Advised to stay adequately hydrated.  Patient reports he has cardiology follow-up in the next several months.  He will keep that with them.  Orders: -     EKG 12-Lead -     Comprehensive metabolic panel -     TSH -     CBC  Essential hypertension Assessment & Plan: Chronic issue.  Adequately controlled though patient did have what seems to be orthostatic syncope.  We will reduce his Cardura dose to 2 mg daily.  He will continue carvedilol 6.25 mg twice daily and Lasix 40 mg daily.  Orders: -     Doxazosin Mesylate; TAKE 1 TABLET(2 MG) BY MOUTH DAILY  Dispense: 90 tablet; Refill: 1  Hypothyroidism, unspecified type -     Levothyroxine Sodium; TAKE 1 TABLET(100 MCG) BY MOUTH DAILY BEFORE BREAKFAST  Dispense: 90 tablet; Refill: 1 -     TSH  Diabetic polyneuropathy associated with type 2 diabetes mellitus (HCC) Assessment & Plan: Chronic issue.  Stable.  Patient will continue Lyrica 200 mg twice daily and as needed use of Vicodin.  Urine drug screen completed today.  Orders: -     Drug Monitoring Panel [id number] , Urine  DM type 2 with diabetic peripheral neuropathy (HCC) Assessment & Plan: Chronic issue.  Check A1c.  Discontinue metformin given GI side effects.  Patient will continue [name] 44 units daily, [name] 10 mg daily, and Ozempic 1 mg weekly.  Patient will let us know if GI symptoms do not improve off of metformin.  Orders: -     Hemoglobin A1c  Anxiety and depression Assessment & Plan: Chronic issue.  Stable.  He will continue Cymbalta 60 mg daily.   Toe infection Assessment & Plan: Left great toe concerning for infection.  Will start him on Bactrim.  He will contact his podiatrist for evaluation.  If not improving on Bactrim he will let us know.  Advised to seek medical attention for spreading redness, fevers, or any worsening or new symptoms.  Orders: -     Sulfamethoxazole-Trimethoprim; Take  1 tablet by mouth 2 (two) times daily.  Dispense: 14 tablet; Refill: 0  Memory difficulties Assessment & Plan: Recent onset issue.  Check B12 and TSH.  Orders: -     TSH -     Vitamin B12  Flatulence Assessment & Plan: This could be medication side effect.  Discontinue metformin to see if this is beneficial   Other orders -     Carvedilol; TAKE 1 TABLET(6.25 MG) BY MOUTH TWICE DAILY WITH A MEAL  Dispense: 180 tablet; Refill: 2 -     Furosemide; TAKE 1 TABLET(40 MG) BY MOUTH DAILY  Dispense: 90 tablet; Refill: 1 -     Simvastatin; TAKE 1 TABLET(40 MG) BY MOUTH AT BEDTIME  Dispense: 90 tablet; Refill: 3 -     Calcitriol; Take 1 capsule (0.25 mcg total) by mouth daily.  Dispense: 30 capsule; Refill: 5   Return in about 3 months (around 04/01/2023).  I have spent 41 minutes in the care of this patient regarding history taking, documentation, completion of exam, discussion of plan, placing orders, review of prior EKG.  This time does not include the time spent reading his current EKG.   Douglas Alar, MD [facility]

## 2022-12-31 NOTE — Assessment & Plan Note (Addendum)
Suspect orthostatic syncope given his description of symptoms.  EKG completed today.  We will reduce his Cardura to 2 mg daily and see if that helps limit his lightheadedness.  If he has recurrent syncope he was advised to go to the emergency department.  Advised to stay adequately hydrated.  Patient reports he has cardiology follow-up in the next several months.  He will keep that with them.

## 2022-12-31 NOTE — Assessment & Plan Note (Signed)
Left great toe concerning for infection.  Will start him on Bactrim.  He will contact his podiatrist for evaluation.  If not improving on Bactrim he will let us know.  Advised to seek medical attention for spreading redness, fevers, or any worsening or new symptoms.

## 2022-12-31 NOTE — Assessment & Plan Note (Addendum)
This could be medication side effect.  Discontinue metformin to see if this is beneficial

## 2022-12-31 NOTE — Assessment & Plan Note (Signed)
Chronic issue.  Stable.  He will continue Cymbalta 60 mg daily.

## 2023-01-02 LAB — DRUG MONITORING PANEL 376104, URINE
Amphetamines: NEGATIVE ng/mL (ref <500)
Barbiturates: NEGATIVE ng/mL (ref <300)
Benzodiazepines: NEGATIVE ng/mL (ref <100)
Cocaine Metabolite: NEGATIVE ng/mL (ref <150)
Desmethyltramadol: NEGATIVE ng/mL (ref <100)
Opiates: NEGATIVE ng/mL (ref <100)
Oxycodone: NEGATIVE ng/mL (ref <100)
Tramadol: NEGATIVE ng/mL (ref <100)

## 2023-01-02 LAB — DM TEMPLATE

## 2023-01-05 ENCOUNTER — Telehealth: Payer: Self-pay | Admitting: Family Medicine

## 2023-01-05 NOTE — Telephone Encounter (Signed)
Patient returned office phone call. Not sure who called him.

## 2023-01-05 NOTE — Telephone Encounter (Signed)
See results note please

## 2023-01-07 ENCOUNTER — Telehealth: Payer: Self-pay | Admitting: *Deleted

## 2023-01-07 DIAGNOSIS — N179 Acute kidney failure, unspecified: Secondary | ICD-10-CM

## 2023-01-07 NOTE — Telephone Encounter (Signed)
Ordered

## 2023-01-07 NOTE — Telephone Encounter (Signed)
Please place future orders for lab appt.

## 2023-01-08 ENCOUNTER — Other Ambulatory Visit (INDEPENDENT_AMBULATORY_CARE_PROVIDER_SITE_OTHER): Payer: PPO

## 2023-01-08 DIAGNOSIS — N179 Acute kidney failure, unspecified: Secondary | ICD-10-CM

## 2023-01-08 LAB — BASIC METABOLIC PANEL
BUN: 15 mg/dL (ref 6–23)
CO2: 27 mEq/L (ref 19–32)
Calcium: 9.2 mg/dL (ref 8.4–10.5)
Chloride: 102 mEq/L (ref 96–112)
Creatinine, Ser: 1.66 mg/dL — ABNORMAL HIGH (ref 0.40–1.50)
GFR: 39.49 mL/min — ABNORMAL LOW (ref >60.00)
Glucose, Bld: 154 mg/dL — ABNORMAL HIGH (ref 70–99)
Potassium: 4 mEq/L (ref 3.5–5.1)
Sodium: 137 mEq/L (ref 135–145)

## 2023-01-09 ENCOUNTER — Ambulatory Visit (INDEPENDENT_AMBULATORY_CARE_PROVIDER_SITE_OTHER): Payer: PPO | Admitting: *Deleted

## 2023-01-09 DIAGNOSIS — E538 Deficiency of other specified B group vitamins: Secondary | ICD-10-CM | POA: Diagnosis not present

## 2023-01-09 MED ORDER — CYANOCOBALAMIN 1000 MCG/ML IJ SOLN
1000.0000 ug | Freq: Once | INTRAMUSCULAR | Status: AC
Start: 2023-01-09 — End: 2023-01-09
  Administered 2023-01-09: 1000 ug via INTRAMUSCULAR

## 2023-01-09 NOTE — Progress Notes (Signed)
Pt presented for their vitamin B12 injection. Pt was identified through two identifiers. Pt tolerated shot well in their left or right deltoid.  

## 2023-01-16 ENCOUNTER — Other Ambulatory Visit: Payer: Self-pay | Admitting: Family Medicine

## 2023-01-16 NOTE — Telephone Encounter (Signed)
Can you see if the doc of the day could sign off on a refill of this? For some reason imprivata my phone is not connecting to the computer and will not let me sign off on controlled substances.

## 2023-01-27 ENCOUNTER — Ambulatory Visit
Admission: RE | Admit: 2023-01-27 | Discharge: 2023-01-27 | Disposition: A | Discharge status: Home / Self Care | Payer: PPO | Source: Ambulatory Visit | Attending: Urology | Admitting: Urology

## 2023-01-27 DIAGNOSIS — N2889 Other specified disorders of kidney and ureter: Secondary | ICD-10-CM | POA: Insufficient documentation

## 2023-02-04 ENCOUNTER — Encounter: Payer: Self-pay | Admitting: Urology

## 2023-02-04 ENCOUNTER — Telehealth: Payer: Self-pay | Admitting: Family Medicine

## 2023-02-04 ENCOUNTER — Ambulatory Visit: Payer: PPO | Admitting: Urology

## 2023-02-04 VITALS — BP 169/98 | HR 81 | Ht 72.0 in | Wt 260.0 lb

## 2023-02-04 DIAGNOSIS — N2889 Other specified disorders of kidney and ureter: Secondary | ICD-10-CM | POA: Diagnosis not present

## 2023-02-04 DIAGNOSIS — N179 Acute kidney failure, unspecified: Secondary | ICD-10-CM

## 2023-02-04 NOTE — Telephone Encounter (Signed)
Ordered

## 2023-02-04 NOTE — Telephone Encounter (Signed)
Patient need lab orders.

## 2023-02-04 NOTE — Progress Notes (Unsigned)
I, Maysun Anabel Bene, acting as a scribe for Riki Altes, MD., have documented all relevant documentation on the behalf of Riki Altes, MD, as directed by Riki Altes, MD while in the presence of Riki Altes, MD.  02/04/2023 4:16 PM   Douglas [name] 10/31/1945 [id number]  Referring provider: Glori Luis, MD [address] [place],  [place] [address]  Chief Complaint  Patient presents with   Other   renal mass   Urologic history: 1. T1 renal mass CT angio chest/abdomen/pelvis with incidental enhancing 2.8 x 2.7 x 2.4 cm left upper pole mass 01/2019 MRI abdomen without contrast 03/13/2019 ordered by nephrology measured at 2.9 x 2.5 x 2.5 cm Elected surveillance RUS 07/2019 2.9 x 2.6 x 3.0   2.  BPH with lower urinary tract symptoms Combination tx doxazosin/finasteride   3.  Erectile dysfunction  HPI: Douglas [name] is a 77 y.o. male presents for annual follow-up.   He was not seen last year, however, did have an MRI in October 2023 which showed the left upper pole renal mass was slightly smaller at 2.2 x 1.9 cm.  No flank, abd or pelvic pain  Denies gross hematuria   PMH: Past Medical History:  Diagnosis Date   Arthritis    Depression    Diabetes mellitus with complication (HCC)    Diabetic neuropathy (HCC)    Diastolic dysfunction    a. TTE 07/2017: EF 60-65%, mild concentric LVH, no RWMA, Gr1DD, trivial AI, mildly dilated LA, RVSF normal, PASP normal   Edema    feet/legs   GERD (gastroesophageal reflux disease)    Gout    History of stress test    a. MV 06/2017: small in size, mild in severity, apical anterior and apical defect that was minimally reversible and most likely represented artifact and less likely ischemia/scar, LVEF 55-65%, low risk, probably normal stress test   Hypertension    Hypothyroidism    Kidney cysts    renal failure 2013   Kidney stones    Dr. Artis Flock   OSA (obstructive sleep apnea)    supplemental  oxygen at night   Oxygen dependent    hs   [facility] spotted fever 12/10/2017   Positve IgG in titer on 11/20/17   S/P cardiac cath 1998   a. no obstructive disease   Syncope and collapse    Temporary low platelet count (HCC)     Surgical History: Past Surgical History:  Procedure Laterality Date   ANAL FISSURE REPAIR     BACK SURGERY  1977   rupture disc lumbar spine   CARDIAC CATHETERIZATION  1998   [facility].    CATARACT EXTRACTION W/PHACO Right 07/16/2016   Procedure: CATARACT EXTRACTION PHACO AND INTRAOCULAR LENS PLACEMENT (IOC);  Surgeon: Sallee Lange, MD;  Location: [facility] ORS;  Service: Ophthalmology;  Laterality: Right;  [place] 01:11 AP% 17.6 CDE 24.83 fluid pack lot # [id number]   CATARACT EXTRACTION W/PHACO Left 08/13/2016   Procedure: CATARACT EXTRACTION PHACO AND INTRAOCULAR LENS PLACEMENT (IOC) suture placed in left eye at end of procedure;  Surgeon: Sallee Lange, MD;  Location: [facility] ORS;  Service: Ophthalmology;  Laterality: Left;  [place] 01:55 AP% 22.7 CDE 53.56 fluid pack lot # [id number]   FINGER AMPUTATION     partial   KNEE SURGERY  1993   arthroscopy   SHOULDER SURGERY Bilateral 1998   arthroscopic right, rotator cuff repair left  Home Medications:  Allergies as of 02/04/2023       Reactions   Bee Venom Swelling   Dye Fdc Red [red Dye #40 (allura Red)] Swelling, Other (See Comments)   Reaction: gout   Omeprazole Other (See Comments)   Other reaction(s): increased sx on high dose   Atenolol Other (See Comments)   Did not regulate blood pressure   Benadryl [diphenhydramine Hcl (sleep)] Other (See Comments)   Hyperactivity   Enalapril Itching   itching   Gabapentin Palpitations, Rash   rash   Hctz [hydrochlorothiazide] Other (See Comments)   Decreased potassium        Medication List        Accurate as of February 04, 2023  4:16 PM. If you have any questions, ask your nurse or doctor.           acetaminophen 500 MG tablet Commonly known as: TYLENOL Take 500 mg by mouth every 6 (six) hours as needed.   aspirin 81 MG tablet Take 1 tablet (81 mg total) by mouth daily.   BL VITAMIN B COMPLEX PO Take by mouth daily.   calcitRIOL 0.25 MCG capsule Commonly known as: ROCALTROL Take 1 capsule (0.25 mcg total) by mouth daily.   carvedilol 6.25 MG tablet Commonly known as: COREG TAKE 1 TABLET(6.25 MG) BY MOUTH TWICE DAILY WITH A MEAL   clotrimazole-betamethasone cream Commonly known as: Lotrisone Apply 1 application. topically 2 (two) times daily.   clove oil liquid Apply 1 application topically as needed.   cyclobenzaprine 10 MG tablet Commonly known as: FLEXERIL Take 1 tablet by mouth as needed.   dapagliflozin propanediol 10 MG Tabs tablet Commonly known as: [name] Take 1 tablet (10 mg total) by mouth daily.   doxazosin 2 MG tablet Commonly known as: CARDURA TAKE 1 TABLET(2 MG) BY MOUTH DAILY   DULoxetine 60 MG capsule Commonly known as: CYMBALTA TAKE 1 CAPSULE(60 MG) BY MOUTH DAILY   esomeprazole 40 MG capsule Commonly known as: NexIUM Take 1 capsule (40 mg total) by mouth daily at 12 noon.   ferrous sulfate 325 (65 FE) MG tablet Take 1 tablet (325 mg total) by mouth daily with breakfast. Please dispense dye free formulation.   finasteride 5 MG tablet Commonly known as: PROSCAR TAKE 1 TABLET(5 MG) BY MOUTH AT BEDTIME   furosemide 40 MG tablet Commonly known as: LASIX TAKE 1 TABLET(40 MG) BY MOUTH DAILY   gentamicin cream 0.1 % Commonly known as: GARAMYCIN Apply 1 application. topically 2 (two) times daily.   HYDROcodone-acetaminophen 5-325 MG tablet Commonly known as: NORCO/VICODIN Take 1 tablet by mouth every 6 (six) hours as needed for moderate pain.   HYPODERMIC NEEDLE 18GX1" 18G X 1" Misc Use every 14 days to draw up testosterone   HYPODERMIC NEEDLE 25GX1" 25G X 1" Misc Use every 14 days to administer testosterone   levothyroxine 100  MCG tablet Commonly known as: SYNTHROID TAKE 1 TABLET(100 MCG) BY MOUTH DAILY BEFORE BREAKFAST   loratadine 10 MG tablet Commonly known as: CLARITIN Take 10 mg by mouth daily as needed for allergies.   losartan 25 MG tablet Commonly known as: COZAAR TAKE 1 TABLET BY MOUTH EVERY MORNING THEN TAKE 2 TABLETS BY MOUTH EVERY EVENING   MAGNESIUM PO Take by mouth daily at 8 pm.   meloxicam 15 MG tablet Commonly known as: MOBIC Take 1 tablet (15 mg total) by mouth daily.   mometasone 50 MCG/ACT nasal spray Commonly known as: Nasonex Place 2 sprays into the  nose daily.   MULTIVITAMIN MEN PO Take 1 tablet by mouth daily.   Novofine Pen Needle 32G X 6 MM Misc Generic drug: Insulin Pen Needle 1 each by Does not apply route 2 (two) times daily.   Ozempic (1 MG/DOSE) 4 MG/3ML Sopn Generic drug: Semaglutide (1 MG/DOSE) Inject 1 mg into the skin once a week.   pregabalin 200 MG capsule Commonly known as: LYRICA TAKE 1 CAPSULE(200 MG) BY MOUTH TWICE DAILY   simvastatin 40 MG tablet Commonly known as: ZOCOR TAKE 1 TABLET(40 MG) BY MOUTH AT BEDTIME   sulfamethoxazole-trimethoprim 800-160 MG tablet Commonly known as: BACTRIM DS Take 1 tablet by mouth 2 (two) times daily.   tadalafil 20 MG tablet Commonly known as: CIALIS Take by mouth.   testosterone cypionate 200 MG/ML injection Commonly known as: DEPOTESTOSTERONE CYPIONATE Inject 200 mg into the muscle every 14 (fourteen) days.   Evaristo Bury FlexTouch 200 UNIT/ML FlexTouch Pen Generic drug: insulin degludec Inject 40 Units into the skin daily.   VITAMIN D3 PO Take by mouth daily.        Allergies:  Allergies  Allergen Reactions   Bee Venom Swelling   Dye Fdc Red [Red Dye #40 (Allura Red)] Swelling and Other (See Comments)    Reaction: gout   Omeprazole Other (See Comments)    Other reaction(s): increased sx on high dose   Atenolol Other (See Comments)    Did not regulate blood pressure   Benadryl [Diphenhydramine  Hcl (Sleep)] Other (See Comments)    Hyperactivity    Enalapril Itching    itching   Gabapentin Palpitations and Rash    rash   Hctz [Hydrochlorothiazide] Other (See Comments)    Decreased potassium    Family History: Family History  Problem Relation Age of Onset   Breast cancer Mother    Lung cancer Mother    Bone cancer Mother    Heart Problems Mother    Cancer Mother        breast, lung and rib   AAA (abdominal aortic aneurysm) Mother    Heart disease Mother    Heart attack Father    Heart disease Father    Cancer Brother        esophageal   Heart attack Brother    Colon cancer Neg Hx     Social History:  reports that he has never smoked. He has never used smokeless tobacco. He reports that he does not drink alcohol and does not use drugs.   Physical Exam: BP (!) 169/98   Pulse 81   Ht 6' (1.829 m)   Wt 260 lb (117.9 kg)   BMI 35.26 kg/m   Constitutional:  Alert and oriented, No acute distress. HEENT: [place] AT Respiratory: Normal respiratory effort, no increased work of breathing. Psychiatric: Normal mood and affect.   Pertinent Imaging: RUS performed 01/27/23 has not yet been read by radiology.  The images were personally reviewed and interpreted. There are right renal cysts present. The slightly complex left renal cyst appears stable at 1.61 x 1.50 cm. The upper pole renal mass is slightly larger by ultrasound measuring 3.25 x 2.22 cm.  Assessment & Plan:    1. T1 renal mass Slight increase size on recent ultrasound measurements  He is not interested in treatment and have recommended a follow-up MRI for better characterization.    I have reviewed the above documentation for accuracy and completeness, and I agree with the above.   Riki Altes, MD  [facility] [address] [place], [place] [address] [phone]

## 2023-02-09 ENCOUNTER — Other Ambulatory Visit: Payer: Self-pay

## 2023-02-09 ENCOUNTER — Ambulatory Visit (INDEPENDENT_AMBULATORY_CARE_PROVIDER_SITE_OTHER): Payer: PPO

## 2023-02-09 ENCOUNTER — Other Ambulatory Visit (INDEPENDENT_AMBULATORY_CARE_PROVIDER_SITE_OTHER): Payer: PPO

## 2023-02-09 DIAGNOSIS — E538 Deficiency of other specified B group vitamins: Secondary | ICD-10-CM | POA: Diagnosis not present

## 2023-02-09 DIAGNOSIS — N179 Acute kidney failure, unspecified: Secondary | ICD-10-CM | POA: Diagnosis not present

## 2023-02-09 DIAGNOSIS — I10 Essential (primary) hypertension: Secondary | ICD-10-CM

## 2023-02-09 LAB — BASIC METABOLIC PANEL
BUN: 14 mg/dL (ref 6–23)
CO2: 30 meq/L (ref 19–32)
Calcium: 9.2 mg/dL (ref 8.4–10.5)
Chloride: 99 meq/L (ref 96–112)
Creatinine, Ser: 1.49 mg/dL (ref 0.40–1.50)
GFR: 44.93 mL/min — ABNORMAL LOW (ref >60.00)
Glucose, Bld: 172 mg/dL — ABNORMAL HIGH (ref 70–99)
Potassium: 4.2 meq/L (ref 3.5–5.1)
Sodium: 136 meq/L (ref 135–145)

## 2023-02-09 MED ORDER — CYANOCOBALAMIN 1000 MCG/ML IJ SOLN
1000.0000 ug | Freq: Once | INTRAMUSCULAR | Status: AC
Start: 2023-02-09 — End: 2023-02-09
  Administered 2023-02-09: 1000 ug via INTRAMUSCULAR

## 2023-02-09 NOTE — Telephone Encounter (Signed)
Patient arrived for a B12 injection and stated he is completely out of [place]. Last Patient assistance pick up was 06/10/22.  Sending a message to the RX Med  Assistance Team to help. We do not have any samples of [place]. We have a sample of Novolog 70/30 and Fiasp.     Patient also stated his BP has been running higher than normal at 158/96. Patient would like to know if he needs to increase his Doxazosin back to a whole pill instead of half of a pill?

## 2023-02-09 NOTE — Progress Notes (Signed)
Pt presented for their vitamin B12 injection. Pt was identified through two identifiers. Pt tolerated shot well in their right deltoid.  

## 2023-02-10 ENCOUNTER — Ambulatory Visit
Admission: RE | Admit: 2023-02-10 | Discharge: 2023-02-10 | Disposition: A | Discharge status: Home / Self Care | Payer: PPO | Source: Ambulatory Visit | Attending: Urology | Admitting: Urology

## 2023-02-10 DIAGNOSIS — K760 Fatty (change of) liver, not elsewhere classified: Secondary | ICD-10-CM | POA: Diagnosis not present

## 2023-02-10 DIAGNOSIS — C642 Malignant neoplasm of left kidney, except renal pelvis: Secondary | ICD-10-CM | POA: Diagnosis not present

## 2023-02-10 DIAGNOSIS — N2889 Other specified disorders of kidney and ureter: Secondary | ICD-10-CM | POA: Insufficient documentation

## 2023-02-10 MED ORDER — GADOBUTROL 1 MMOL/ML IV SOLN
10.0000 mL | Freq: Once | INTRAVENOUS | Status: AC | PRN
  Administered 2023-02-10: 10 mL via INTRAVENOUS

## 2023-02-10 NOTE — Telephone Encounter (Signed)
Noted.  Please follow-up with the prescription med assistance team to see if they were able to resolve this for him.  Unfortunately the 70/30 NovoLog and the [place] are not equivalent to his [place].  Please see if his lightheadedness has improved with the dose reduction of the doxazosin.  Has he had any additional syncopal episodes?

## 2023-02-12 ENCOUNTER — Telehealth: Payer: Self-pay

## 2023-02-12 DIAGNOSIS — E1142 Type 2 diabetes mellitus with diabetic polyneuropathy: Secondary | ICD-10-CM

## 2023-02-12 NOTE — Telephone Encounter (Signed)
I mailed lab results to patient

## 2023-02-12 NOTE — Telephone Encounter (Signed)
-----   Message from [facility] T sent at 02/11/2023  3:06 PM EDT ----- Regarding: FW: tresiba Patient is out of tresiba please check on it. ----- Message ----- From: Otho Najjar, CPhT Sent: 02/11/2023   8:24 AM EDT To: Francene Boyers, CMA Subject: RE: Evaristo Bury                                    No patient attached. ----- Message ----- From: Francene Boyers, CMA Sent: 02/09/2023   4:28 PM EDT To: Rx Med Assistance Team Subject: tresiba                                        Patient is out of his [place] and Dr. Birdie Sons would like for you to check into this.

## 2023-02-12 NOTE — Telephone Encounter (Signed)
Patient states he is returning our call.  I read Dr. Bernardo Heater message to patient.  Patient states he can't tell if his lightheadedness has improved with the dose reduction of the doxazosin.  Patient states he is frequently lightheaded.  Patient states when he stands up quickly, he has to wait a minute and then go.  Patient states he has not had any additional syncopal episodes.  Patient states it's hard for him to make water.  Patient states he does empty his bladder but it takes a really long time and it's not very strong.  Patient states he believes this is coming from the decreased dosage of his doxazosin.  Patient states he has started taking the full dose again to see if it will help and it has, so he would like to go back on the full dose if it's ok with Dr. [name].  Patient states he would like to have a copy of his lab results.  Patient states if we need to reach him, we may call him at [phone].  Patient states he does not want me to make this his preferred number to call in his chart because he wants most of the calls to go to the mobile number we have on file, which is his wife's number.

## 2023-02-12 NOTE — Addendum Note (Signed)
Addended by: Kristie Cowman on: 02/12/2023 04:29 PM   Modules accepted: Orders

## 2023-02-12 NOTE — Telephone Encounter (Signed)
Patient states his blood pressure was 150/97 after he spoke with me.  Patient states it has been running a little high since he stopped taking the full dose of the doxazosin.  Patient states he only has lightheadedness if he moves rapid or stands up quickly.  Patient states this is controllable as long as he knows that this is going to happen.  Patient states he will wait to see Dr. Birdie Sons on Dec. 18th.  Patient declines offer to speak with Access Nurse.  Patient states he just had an MRI with contrast on 02/10/2023.

## 2023-02-12 NOTE — Telephone Encounter (Signed)
Called [facility] to follow up on shipment.   Pt is out of refills for medication.  Will fax refill form to office for provider to complete and fax to company.   Pt enrolled until 04/14/23 and is due for renewal with [facility] for [place] and Ozempic. Will mail renewal to patient home or attempt online.  Patient may call [facility] at [phone] to follow up on shipments in the future.

## 2023-02-12 NOTE — Telephone Encounter (Signed)
Pt notified. Pt stated that he will try the increase dosage and let us know how he does. Pt needs a new prescription for the new dosage sent to pharmacy. Pending new dosage for approval

## 2023-02-12 NOTE — Telephone Encounter (Signed)
Spoke with pt's wife she will have husband give office a call once he's available

## 2023-02-13 MED ORDER — DOXAZOSIN MESYLATE 4 MG PO TABS
4.0000 mg | ORAL_TABLET | Freq: Every day | ORAL | 1 refills | Status: DC
Start: 2023-02-13 — End: 2023-10-22

## 2023-02-17 ENCOUNTER — Other Ambulatory Visit: Payer: Self-pay | Admitting: *Deleted

## 2023-02-17 DIAGNOSIS — N2889 Other specified disorders of kidney and ureter: Secondary | ICD-10-CM

## 2023-02-17 DIAGNOSIS — R972 Elevated prostate specific antigen [PSA]: Secondary | ICD-10-CM | POA: Diagnosis not present

## 2023-02-17 DIAGNOSIS — E221 Hyperprolactinemia: Secondary | ICD-10-CM | POA: Diagnosis not present

## 2023-02-17 DIAGNOSIS — E230 Hypopituitarism: Secondary | ICD-10-CM | POA: Diagnosis not present

## 2023-02-18 ENCOUNTER — Telehealth: Payer: Self-pay

## 2023-02-18 NOTE — Telephone Encounter (Signed)
Spoke with Rep in office received number for patient to call to receive a one-time supply for free if qualifies for one month supply.The number for patient to call is [phone] for [employer].

## 2023-02-18 NOTE — Telephone Encounter (Signed)
Informed pt medication was called into pharmacy. Verbal order was given for 10 pens [name] for 40 units daily.

## 2023-02-18 NOTE — Telephone Encounter (Signed)
Called [facility] and they advised me that pt is out of refill per patient assistant.

## 2023-02-18 NOTE — Telephone Encounter (Signed)
Rec'd refill form from office.   Unsure if office sent form to company.   Will refax to [facility] just in case.

## 2023-02-18 NOTE — Telephone Encounter (Signed)
Called pt and he requested his lab results to be mailed to him. Printed out and placed in the mail to go to out.

## 2023-02-18 NOTE — Telephone Encounter (Signed)
Patient states he called [employer] and they gave him an id number ([id number]), bin number ([id number]), a pcn number ([id number]), and a group number ([id number]).  Patient states he was told that he has to take this to his pharmacy ([facility] at [address] in [place]) along with his prescription.  Patients states he was told he can get as many as ten pens and to go ahead and get as many as he can get.  Patient states the last dosage Dr. [name] had him on was 44 units.  Patient states he would like to know when we send the prescription in to his pharmacy.

## 2023-02-26 ENCOUNTER — Telehealth: Payer: Self-pay

## 2023-02-26 NOTE — Telephone Encounter (Signed)
Phone call to pt letting him know that his Douglas [name] has arrived.  We have 3 boxes.  Medications placed in fridge until pick up.  Pt reported that he would have his wife pick up the medications later today.

## 2023-03-11 ENCOUNTER — Telehealth: Payer: Self-pay

## 2023-03-11 NOTE — Progress Notes (Signed)
Pharmacy Medication Assistance Program Note    03/11/2023  Patient ID: Douglas [name], male   DOB: 09/08/1945, 76 y.o.   MRN: [id number]     03/11/2023  Outreach Medication One  Initial Outreach Date (Medication One) 03/11/2023  Manufacturer Medication One [employer] [facility] Evaristo Bury  Dose of Ozempic 2mg   Dose of [name] 200u/ml  Type of Radiographer, therapeutic Assistance  Date Application Sent to Patient 03/11/2023  Date Application Sent to Prescriber 03/11/2023  Name of Prescriber [name]       Signature

## 2023-03-11 NOTE — Telephone Encounter (Signed)
PAP: Application for Ozempic Douglas [name] has been submitted to PAP Companies: [employer], [employer] nutrition[employer] FAXING PROVIDER PAGES TO OFFICE.

## 2023-03-16 ENCOUNTER — Ambulatory Visit: Payer: PPO

## 2023-03-17 ENCOUNTER — Ambulatory Visit (INDEPENDENT_AMBULATORY_CARE_PROVIDER_SITE_OTHER): Payer: PPO | Admitting: *Deleted

## 2023-03-17 DIAGNOSIS — E538 Deficiency of other specified B group vitamins: Secondary | ICD-10-CM

## 2023-03-17 MED ORDER — CYANOCOBALAMIN 1000 MCG/ML IJ SOLN
1000.0000 ug | Freq: Once | INTRAMUSCULAR | Status: AC
Start: 2023-03-17 — End: 2023-03-17
  Administered 2023-03-17: 1000 ug via INTRAMUSCULAR

## 2023-03-17 NOTE — Progress Notes (Signed)
Pt received B12 injection in left deltoid muscle. Pt tolerated it well with no complaints or concerns.  

## 2023-03-19 NOTE — Telephone Encounter (Signed)
Application printed and put in providers to sign basket.

## 2023-03-20 NOTE — Telephone Encounter (Signed)
Signed. Placed in signed folder.

## 2023-03-27 NOTE — Telephone Encounter (Signed)
Provider portion faxed to [facility] for 2025 with Insurance card.

## 2023-04-01 ENCOUNTER — Encounter: Payer: Self-pay | Admitting: Family Medicine

## 2023-04-01 ENCOUNTER — Ambulatory Visit: Payer: PPO | Admitting: Family Medicine

## 2023-04-01 ENCOUNTER — Other Ambulatory Visit: Payer: Self-pay | Admitting: Family Medicine

## 2023-04-01 VITALS — BP 128/82 | HR 84 | Temp 97.9°F | Ht 72.0 in | Wt 263.4 lb

## 2023-04-01 DIAGNOSIS — M170 Bilateral primary osteoarthritis of knee: Secondary | ICD-10-CM | POA: Diagnosis not present

## 2023-04-01 DIAGNOSIS — E1142 Type 2 diabetes mellitus with diabetic polyneuropathy: Secondary | ICD-10-CM

## 2023-04-01 DIAGNOSIS — R0609 Other forms of dyspnea: Secondary | ICD-10-CM

## 2023-04-01 DIAGNOSIS — I10 Essential (primary) hypertension: Secondary | ICD-10-CM | POA: Diagnosis not present

## 2023-04-01 DIAGNOSIS — L600 Ingrowing nail: Secondary | ICD-10-CM | POA: Diagnosis not present

## 2023-04-01 LAB — COMPREHENSIVE METABOLIC PANEL
ALT: 13 U/L (ref 0–53)
AST: 14 U/L (ref 0–37)
Albumin: 3.8 g/dL (ref 3.5–5.2)
Alkaline Phosphatase: 74 U/L (ref 39–117)
BUN: 13 mg/dL (ref 6–23)
CO2: 31 meq/L (ref 19–32)
Calcium: 8.9 mg/dL (ref 8.4–10.5)
Chloride: 99 meq/L (ref 96–112)
Creatinine, Ser: 1.57 mg/dL — ABNORMAL HIGH (ref 0.40–1.50)
GFR: 42.15 mL/min — ABNORMAL LOW (ref >60.00)
Glucose, Bld: 182 mg/dL — ABNORMAL HIGH (ref 70–99)
Potassium: 4 meq/L (ref 3.5–5.1)
Sodium: 136 meq/L (ref 135–145)
Total Bilirubin: 0.7 mg/dL (ref 0.2–1.2)
Total Protein: 6.6 g/dL (ref 6.0–8.3)

## 2023-04-01 LAB — CBC
HCT: 40.6 % (ref 39.0–52.0)
Hemoglobin: 12.7 g/dL — ABNORMAL LOW (ref 13.0–17.0)
MCHC: 31.3 g/dL (ref 30.0–36.0)
MCV: 82 fL (ref 78.0–100.0)
Platelets: 311 10*3/uL (ref 150.0–400.0)
RBC: 4.95 Mil/uL (ref 4.22–5.81)
RDW: 16.6 % — ABNORMAL HIGH (ref 11.5–15.5)
WBC: 8.3 10*3/uL (ref 4.0–10.5)

## 2023-04-01 LAB — LIPID PANEL
Cholesterol: 146 mg/dL (ref 0–200)
HDL: 29.6 mg/dL — ABNORMAL LOW (ref >39.00)
LDL Cholesterol: 66 mg/dL (ref 0–99)
NonHDL: 115.96
Total CHOL/HDL Ratio: 5
Triglycerides: 249 mg/dL — ABNORMAL HIGH (ref 0.0–149.0)
VLDL: 49.8 mg/dL — ABNORMAL HIGH (ref 0.0–40.0)

## 2023-04-01 LAB — TSH: TSH: 2.1 u[IU]/mL (ref 0.35–5.50)

## 2023-04-01 LAB — HEMOGLOBIN A1C: Hgb A1c MFr Bld: 8 % — ABNORMAL HIGH (ref 4.6–6.5)

## 2023-04-01 NOTE — Assessment & Plan Note (Signed)
Chronic issue.  Concern for cardiac issue.  Refer back to cardiology.  Check lab work today.  Advised to seek medical attention the emergency room for any sudden worsening of symptoms.

## 2023-04-01 NOTE — Assessment & Plan Note (Signed)
Chronic issue.  Adequately controlled for age particularly in the setting of prior orthostatic syncope.  Patient will continue Cardura 4 mg daily, carvedilol 6.25 mg twice daily, and Lasix 40 mg daily.

## 2023-04-01 NOTE — Assessment & Plan Note (Addendum)
Chronic issue.  Patient is essentially on max dose Lyrica.  He will continue Lyrica 200 mg twice daily.  Will refer to pain management to get their input on options for treatment of his neuropathy symptoms.  Suspect his neuropathy is contributing to his balance issues.

## 2023-04-01 NOTE — Assessment & Plan Note (Signed)
Chronic issue.  Refer back to sports medicine.

## 2023-04-01 NOTE — Progress Notes (Signed)
[name], MD Phone: [phone]  Douglas [name] is a 77 y.o. male who presents today for follow-up.  Diabetic neuropathy: Patient notes worsening stinging and burning sensation in his bilateral feet.  Notes this is occurring most of the time.  He is on Lyrica 200 mg twice daily.  He stopped taking hydrocodone as it was not beneficial.  He does use some ice and Salonpas with some benefit.  Does he feels off balance at times.  Bilateral knee pain: Patient notes bilateral knee pain related to arthritis.  He wonders about seeing his sports medicine specialist who he has not seen in some time.  Hypertension: Patient is on Cardura, carvedilol, and Lasix.  No chest pain.  No lightheadedness.  No syncope.  Shortness of breath with exertion: Patient has this has been going on for 6 months or so.  If he walks 50 feet or more he will feel short of breath.  It has been over a year since he seen cardiology.  He had nuclear stress testing and echo in 2019.  Ingrown toenail: Patient reports ingrown toenail on his right toe.  He notes he cut this out himself and has a cut that has been slow to heal over the last month.  Social History   Tobacco Use  Smoking Status Never  Smokeless Tobacco Never    Current Outpatient Medications on File Prior to Visit  Medication Sig Dispense Refill   acetaminophen (TYLENOL) 500 MG tablet Take 500 mg by mouth every 6 (six) hours as needed.     aspirin 81 MG tablet Take 1 tablet (81 mg total) by mouth daily.     B Complex Vitamins (BL VITAMIN B COMPLEX PO) Take by mouth daily.     calcitRIOL (ROCALTROL) 0.25 MCG capsule Take 1 capsule (0.25 mcg total) by mouth daily. 30 capsule 5   carvedilol (COREG) 6.25 MG tablet TAKE 1 TABLET(6.25 MG) BY MOUTH TWICE DAILY WITH A MEAL 180 tablet 2   Cholecalciferol (VITAMIN D3 PO) Take by mouth daily.     clotrimazole-betamethasone (LOTRISONE) cream Apply 1 application. topically 2 (two) times daily. 45 g 1   clove oil  liquid Apply 1 application topically as needed.     cyclobenzaprine (FLEXERIL) 10 MG tablet Take 1 tablet by mouth as needed.     dapagliflozin propanediol ([name]) 10 MG TABS tablet Take 1 tablet (10 mg total) by mouth daily. 90 tablet 3   doxazosin (CARDURA) 4 MG tablet Take 1 tablet (4 mg total) by mouth daily. 90 tablet 1   DULoxetine (CYMBALTA) 60 MG capsule TAKE 1 CAPSULE(60 MG) BY MOUTH DAILY 90 capsule 3   esomeprazole (NEXIUM) 40 MG capsule Take 1 capsule (40 mg total) by mouth daily at 12 noon. 90 capsule 3   ferrous sulfate 325 (65 FE) MG tablet Take 1 tablet (325 mg total) by mouth daily with breakfast. Please dispense dye free formulation. 90 tablet 1   finasteride (PROSCAR) 5 MG tablet TAKE 1 TABLET(5 MG) BY MOUTH AT BEDTIME 90 tablet 3   furosemide (LASIX) 40 MG tablet TAKE 1 TABLET(40 MG) BY MOUTH DAILY 90 tablet 1   gentamicin cream (GARAMYCIN) 0.1 % Apply 1 application. topically 2 (two) times daily. 30 g 1   HYDROcodone-acetaminophen (NORCO/VICODIN) 5-325 MG tablet Take 1 tablet by mouth every 6 (six) hours as needed for moderate pain. 30 tablet 0   insulin degludec ([name] FLEXTOUCH) 200 UNIT/ML FlexTouch Pen Inject 40 Units into the skin daily. 15 mL 3  Insulin Pen Needle (NOVOFINE PEN NEEDLE) 32G X 6 MM MISC 1 each by Does not apply route 2 (two) times daily. 1 each 3   levothyroxine (SYNTHROID) 100 MCG tablet TAKE 1 TABLET(100 MCG) BY MOUTH DAILY BEFORE BREAKFAST 90 tablet 1   loratadine (CLARITIN) 10 MG tablet Take 10 mg by mouth daily as needed for allergies.     losartan (COZAAR) 25 MG tablet TAKE 1 TABLET BY MOUTH EVERY MORNING THEN TAKE 2 TABLETS BY MOUTH EVERY EVENING 270 tablet 3   MAGNESIUM PO Take by mouth daily at 8 pm.     meloxicam (MOBIC) 15 MG tablet Take 1 tablet (15 mg total) by mouth daily. 30 tablet 1   mometasone (NASONEX) 50 MCG/ACT nasal spray Place 2 sprays into the nose daily. 1 each 12   Multiple Vitamins-Minerals (MULTIVITAMIN MEN PO) Take 1  tablet by mouth daily.     Needle, Disp, (HYPODERMIC NEEDLE 18GX1") 18G X 1" MISC Use every 14 days to draw up testosterone     Needle, Disp, (HYPODERMIC NEEDLE 25GX1") 25G X 1" MISC Use every 14 days to administer testosterone     pregabalin (LYRICA) 200 MG capsule TAKE 1 CAPSULE(200 MG) BY MOUTH TWICE DAILY 180 capsule 0   Semaglutide, 1 MG/DOSE, (OZEMPIC, 1 MG/DOSE,) 4 MG/3ML SOPN Inject 1 mg into the skin once a week. 3 mL 3   simvastatin (ZOCOR) 40 MG tablet TAKE 1 TABLET(40 MG) BY MOUTH AT BEDTIME 90 tablet 3   sulfamethoxazole-trimethoprim (BACTRIM DS) 800-160 MG tablet Take 1 tablet by mouth 2 (two) times daily. 14 tablet 0   tadalafil (CIALIS) 20 MG tablet Take by mouth.     testosterone cypionate (DEPOTESTOSTERONE CYPIONATE) 200 MG/ML injection Inject 200 mg into the muscle every 14 (fourteen) days.     No current facility-administered medications on file prior to visit.     ROS see history of present illness  Objective  Physical Exam Vitals:   04/01/23 1105  BP: 128/82  Pulse: 84  Temp: 97.9 F (36.6 C)  SpO2: 95%    BP Readings from Last 3 Encounters:  04/01/23 128/82  02/04/23 (!) 169/98  12/31/22 124/78   Wt Readings from Last 3 Encounters:  04/01/23 263 lb 6.4 oz (119.5 kg)  02/04/23 260 lb (117.9 kg)  12/31/22 257 lb (116.6 kg)    Physical Exam Constitutional:      General: He is not in acute distress.    Appearance: He is not diaphoretic.  Cardiovascular:     Rate and Rhythm: Normal rate and regular rhythm.     Heart sounds: Normal heart sounds.  Pulmonary:     Effort: Pulmonary effort is normal.     Breath sounds: Normal breath sounds.  Skin:    General: Skin is warm and dry.  Neurological:     Mental Status: He is alert.    Diabetic Foot Exam - Simple   Simple Foot Form Diabetic Foot exam was performed with the following findings: Yes 04/01/2023 11:23 AM  Visual Inspection See comments: Yes Sensation Testing See comments: Yes Pulse  Check Posterior Tibialis and Dorsalis pulse intact bilaterally: Yes Comments Reduced sensation to light touch and monofilament testing bilaterally, patient has a scabbed over wound on his right great toe, abnormal toenails bilaterally, no other skin breakdown noted in either foot    EKG: None atrial rhythm, rate 81, P waves noted, first-degree AV block, no apparent ST or T wave changes  Assessment/Plan: Please see individual problem list.  DOE (dyspnea on exertion) Assessment & Plan: Chronic issue.  Concern for cardiac issue.  Refer back to cardiology.  Check lab work today.  Advised to seek medical attention the emergency room for any sudden worsening of symptoms.  Orders: -     EKG 12-Lead -     Ambulatory referral to Cardiology -     CBC -     TSH  Essential hypertension Assessment & Plan: Chronic issue.  Adequately controlled for age particularly in the setting of prior orthostatic syncope.  Patient will continue Cardura 4 mg daily, carvedilol 6.25 mg twice daily, and Lasix 40 mg daily.  Orders: -     Comprehensive metabolic panel -     Lipid panel  Diabetic polyneuropathy associated with type 2 diabetes mellitus (HCC) Assessment & Plan: Chronic issue.  Patient is essentially on max dose Lyrica.  He will continue Lyrica 200 mg twice daily.  Will refer to pain management to get their input on options for treatment of his neuropathy symptoms.  Suspect his neuropathy is contributing to his balance issues.  Orders: -     Ambulatory referral to Pain Clinic  Primary osteoarthritis of both knees Assessment & Plan: Chronic issue.  Refer back to sports medicine.  Orders: -     Ambulatory referral to Sports Medicine  Ingrown toenail Assessment & Plan: Slowly healing after the patient treated this himself.  Advised not to treat this on his own and to see his podiatrist for this kind of thing in the future.  He is going to contact his podiatrist for evaluation.   DM type 2 with  diabetic peripheral neuropathy (HCC) -     Lipid panel -     Hemoglobin A1c     Return in about 3 months (around 06/30/2023) for transfer care.   [name], MD [facility]

## 2023-04-01 NOTE — Patient Instructions (Signed)
Nice to see you. We will contact you with your lab results. Please call your podiatrist to get scheduled for a visit. The pain specialist, cardiology, and sports medicine should contact you to schedule a visit. If you develop worsening shortness of breath or if you develop chest pain please seek medical attention immediately.

## 2023-04-01 NOTE — Assessment & Plan Note (Signed)
Slowly healing after the patient treated this himself.  Advised not to treat this on his own and to see his podiatrist for this kind of thing in the future.  He is going to contact his podiatrist for evaluation.

## 2023-04-07 ENCOUNTER — Telehealth: Payer: Self-pay

## 2023-04-07 NOTE — Telephone Encounter (Signed)
Attempted to call the Patient at home per wife but no answer.

## 2023-04-07 NOTE — Telephone Encounter (Signed)
-----   Message from Loree Fee sent at 04/07/2023 11:57 AM EST ----- Hello, unfortunately I have never spoken with this patient. I am not sure if maybe the team who is helping him with his applications spoke with him.  I do see application in Media tab. Ozempic 2 mg pens indicate 4 boxes though the box is not checked. Not sure if this is part of the issue?   Cc'ing MAP team as well to possibly re-sent rx form w box checked off ----- Message ----- From: Glori Luis, MD Sent: 04/07/2023  11:44 AM EST To: Loree Fee, RPH  Noted. I will forward this to Lillia Abed to see where this stands. I can see where we have completed the application for his insulin, though I do not see anything about his ozempic.

## 2023-04-09 NOTE — Telephone Encounter (Signed)
Corrected pcp pages & refaxed-   Checked Ozempic 2mg  box and changed [name] qty to 27 (3 month supply).

## 2023-04-10 ENCOUNTER — Telehealth: Payer: Self-pay | Admitting: Family Medicine

## 2023-04-10 DIAGNOSIS — D649 Anemia, unspecified: Secondary | ICD-10-CM

## 2023-04-10 NOTE — Telephone Encounter (Signed)
CBC ordered 

## 2023-04-10 NOTE — Telephone Encounter (Signed)
Patient need lab orders.

## 2023-04-13 ENCOUNTER — Other Ambulatory Visit: Payer: Self-pay | Admitting: Family Medicine

## 2023-04-13 ENCOUNTER — Telehealth: Payer: Self-pay

## 2023-04-13 NOTE — Telephone Encounter (Signed)
Copied from CRM [id number]. Topic: Clinical - Medication Question >> Apr 13, 2023  4:07 PM Alona Bene A wrote: Reason for CRM: Patient called in returning phone call from [place]. Please contact patient regarding insulin. Patient can be reached at [phone].

## 2023-04-13 NOTE — Telephone Encounter (Signed)
Please follow-up with the patient and see if he is still taking the allopurinol.  My understanding was that he came off of this previously.

## 2023-04-14 DIAGNOSIS — N1831 Chronic kidney disease, stage 3a: Secondary | ICD-10-CM | POA: Diagnosis not present

## 2023-04-14 DIAGNOSIS — N2581 Secondary hyperparathyroidism of renal origin: Secondary | ICD-10-CM | POA: Diagnosis not present

## 2023-04-14 DIAGNOSIS — E1122 Type 2 diabetes mellitus with diabetic chronic kidney disease: Secondary | ICD-10-CM | POA: Diagnosis not present

## 2023-04-14 DIAGNOSIS — R600 Localized edema: Secondary | ICD-10-CM | POA: Diagnosis not present

## 2023-04-14 DIAGNOSIS — I10 Essential (primary) hypertension: Secondary | ICD-10-CM | POA: Diagnosis not present

## 2023-04-14 DIAGNOSIS — D631 Anemia in chronic kidney disease: Secondary | ICD-10-CM | POA: Diagnosis not present

## 2023-04-14 NOTE — Telephone Encounter (Signed)
[facility] sent an approval letter on 04/10/23 and [facility] states the Ozempic was picked up by [employer] and sent out for delivery and the Patient is aware.

## 2023-04-14 NOTE — Telephone Encounter (Signed)
Patient states he would like to see Dr. Cherylann Ratel at pain management. Patient also needs his Ozempic.

## 2023-04-14 NOTE — Telephone Encounter (Signed)
 Please see my prior message on this and follow-up with the patient.

## 2023-04-16 ENCOUNTER — Ambulatory Visit: Payer: PPO

## 2023-04-16 ENCOUNTER — Other Ambulatory Visit (INDEPENDENT_AMBULATORY_CARE_PROVIDER_SITE_OTHER): Payer: PPO

## 2023-04-16 DIAGNOSIS — D649 Anemia, unspecified: Secondary | ICD-10-CM

## 2023-04-16 DIAGNOSIS — E538 Deficiency of other specified B group vitamins: Secondary | ICD-10-CM | POA: Diagnosis not present

## 2023-04-16 MED ORDER — CYANOCOBALAMIN 1000 MCG/ML IJ SOLN
1000.0000 ug | Freq: Once | INTRAMUSCULAR | Status: AC
Start: 2023-04-16 — End: 2023-04-16
  Administered 2023-04-16: 1000 ug via INTRAMUSCULAR

## 2023-04-16 NOTE — Telephone Encounter (Signed)
 It looks like pain management has left a message for the patient to call and schedule an appointment. He should be able to request an appointment with Dr Marcelino when he calls. If he is not able to make this request we can place another referral specifically to Dr Marcelino.

## 2023-04-16 NOTE — Telephone Encounter (Signed)
 Number given to pt to call and schedule an appt

## 2023-04-16 NOTE — Progress Notes (Signed)
 Patient presented for B 12 injection to right deltoid, patient voiced no concerns nor showed any signs of distress during injection.

## 2023-04-16 NOTE — Telephone Encounter (Signed)
 Lvm to give number to pain management so pt can scheduled appt. Phone number for pain management is [phone]

## 2023-04-16 NOTE — Telephone Encounter (Signed)
 Copied from CRM [id number]. Topic: General - Other >> Apr 16, 2023 11:42 AM Mercedes MATSU wrote: Reason for CRM: Patients wife called in because the patient had a missed call and is requesting a call back from the nurse so they can be provided the Pain Management Clinics Information.

## 2023-04-16 NOTE — Telephone Encounter (Signed)
 He is referring to Dr Cherylann Ratel with pain management. He just saw Dr Cherylann Ratel with nephrology. Please relay my prior message regarding Dr Cherylann Ratel with pain management.

## 2023-04-17 ENCOUNTER — Other Ambulatory Visit: Payer: Self-pay | Admitting: Family Medicine

## 2023-04-17 DIAGNOSIS — D649 Anemia, unspecified: Secondary | ICD-10-CM

## 2023-04-17 LAB — CBC
HCT: 40.5 % (ref 39.0–52.0)
Hemoglobin: 12.7 g/dL — ABNORMAL LOW (ref 13.0–17.0)
MCHC: 31.3 g/dL (ref 30.0–36.0)
MCV: 79.4 fL (ref 78.0–100.0)
Platelets: 218 10*3/uL (ref 150.0–400.0)
RBC: 5.11 Mil/uL (ref 4.22–5.81)
RDW: 17.2 % — ABNORMAL HIGH (ref 11.5–15.5)
WBC: 6.8 10*3/uL (ref 4.0–10.5)

## 2023-04-21 ENCOUNTER — Other Ambulatory Visit: Payer: Self-pay | Admitting: Family Medicine

## 2023-04-22 ENCOUNTER — Other Ambulatory Visit (INDEPENDENT_AMBULATORY_CARE_PROVIDER_SITE_OTHER): Payer: PPO

## 2023-04-22 DIAGNOSIS — D649 Anemia, unspecified: Secondary | ICD-10-CM | POA: Diagnosis not present

## 2023-04-22 LAB — IBC + FERRITIN
Ferritin: 13.4 ng/mL — ABNORMAL LOW (ref 22.0–322.0)
Iron: 65 ug/dL (ref 42–165)
Saturation Ratios: 13.5 % — ABNORMAL LOW (ref 20.0–50.0)
TIBC: 480.2 ug/dL — ABNORMAL HIGH (ref 250.0–450.0)
Transferrin: 343 mg/dL (ref 212.0–360.0)

## 2023-04-24 ENCOUNTER — Other Ambulatory Visit: Payer: Self-pay | Admitting: Family Medicine

## 2023-04-24 DIAGNOSIS — D509 Iron deficiency anemia, unspecified: Secondary | ICD-10-CM

## 2023-04-26 NOTE — Progress Notes (Signed)
 Cardiology Clinic Note   Date: 04/28/2023 ID: Douglas [name], DOB 1945/05/14, MRN [id number]  Primary Cardiologist:  Deatrice Cage, MD  Patient Profile    Douglas [name] is a 78 y.o. male who presents to the clinic today for routine follow up.     Past medical history significant for: DOE. Nuclear stress test 07/01/2017: Normal, low risk study.  There is a small in size, mild in severity, apical anterior and apical defect that is minimally reversible and most likely represents artifact and less likely ischemia/scar. Orthostatic dizziness/syncope. Echo 07/17/2017: EF 60 to 65%.  Mild concentric LVH.  No RWMA.  Grade I DD.  Trivial AI.  Mild LAE.  Normal RV size/function.  Normal PA pressure. 14-day ZIO 08/13/2017: HR 64 to 190 bpm, average 84 bpm.  Short runs of SVT longest 11 beats.  Rare PACs/PVCs.  No A-fib. Hypertension. Hyperlipidemia. Lipid panel 04/01/2023: LDL 66, HDL 30, TG 249, total 146. OSA. GERD. T2DM. Hypothyroidism. CKD.  In summary, was first evaluated by Dr. Cage on 06/25/2017 for dizziness and syncope at the request of Dr. Maribeth.  He reported prior stress testing and cardiac catheterization in 1998 negative for obstructive disease.  He reported prolonged episodes of orthostatic dizziness and occasional syncope for many years.  It was felt symptoms were secondary to medications, particularly Cardura , and not arrhythmia.  He reported significant DOE and nuclear stress test was abnormal, low risk study.  Echo April 2019 showed normal LV/RV function as detailed above.  14-day ZIO negative for arrhythmias.     History of Present Illness    Douglas [name] is followed by Dr. Cage for the above outlined history.  Patient was last seen in the office by Dr. Cage on 09/11/2021 for routine follow-up.  He was doing well at that time and no medication changes were made.  Today, patient is doing well. Patient denies orthopnea or PND. He has chronic L>R lower  extremity edema that is at its best in the morning and progresses throughout the day. He has chronic dyspnea with more vigorous exertion but none with routine activities and unchanged from previous.  No chest pain, pressure, or tightness. No palpitations. He does not follow a routine exercise program. Activity is limited secondary to chronic knee and back issues. He has neuropathy in bilateral lower extremities that causes him to have an unsteady gait like I'm a drunk. He denies lightheadedness, dizziness, presyncope or syncope.     ROS: All other systems reviewed and are otherwise negative except as noted in History of Present Illness.  EKGs/Labs Reviewed    EKG Interpretation Date/Time:  Tuesday April 28 2023 10:29:52 EST Ventricular Rate:  71 PR Interval:  242 QRS Duration:  94 QT Interval:  392 QTC Calculation: 425 R Axis:   -45  Text Interpretation: Sinus rhythm with 1st degree A-V block Left anterior fascicular block Left ventricular hypertrophy with repolarization abnormality ( R in aVL , Cornell product ) When compared with ECG of 19-Jan-2019 19:23, PR interval has increased Confirmed by Loistine Sober [phone]) on 04/28/2023 10:33:04 AM   04/01/2023: ALT 13; AST 14; BUN 13; Creatinine, Ser 1.57; Potassium 4.0; Sodium 136   04/16/2023: Hemoglobin 12.7; WBC 6.8   04/01/2023: TSH 2.10   Physical Exam    VS:  BP 128/84 (BP Location: Left Arm, Patient Position: Sitting, Cuff Size: Large)   Pulse 71   Ht 5' 11 (1.803 m)   Wt 262 lb 2 oz (118.9 kg)  SpO2 96%   BMI 36.56 kg/m  , BMI Body mass index is 36.56 kg/m.  GEN: Well nourished, well developed, in no acute distress. Neck: No JVD or carotid bruits. Cardiac:  RRR. No murmurs. No rubs or gallops.   Respiratory:  Respirations regular and unlabored. Clear to auscultation without rales, wheezing or rhonchi. GI: Soft, nontender, nondistended. Extremities: Radials/DP/PT 2+ and equal bilaterally. No clubbing or cyanosis.  Trace edema L>R lower extremities.   Skin: Warm and dry, no rash. Neuro: Strength intact.  Assessment & Plan   Orthostatic dizziness/syncope Patient reports gait instability secondary to bilateral neuropathy. No lightheadedness, dizziness, presyncope or syncope.   Hypertension BP today 128/84.  -Continue carvedilol , losartan .  Hyperlipidemia LDL December 2024 66, at goal. -Continue simvastatin . -Followed by PCP.  Disposition: Return in 1 year or sooner as needed.          Signed, Barnie HERO. Richardine Peppers, DNP, NP-C

## 2023-04-28 ENCOUNTER — Ambulatory Visit: Payer: PPO | Attending: Student | Admitting: Student

## 2023-04-28 ENCOUNTER — Encounter: Payer: Self-pay | Admitting: Student

## 2023-04-28 VITALS — BP 128/84 | HR 71 | Ht 71.0 in | Wt 262.1 lb

## 2023-04-28 DIAGNOSIS — E782 Mixed hyperlipidemia: Secondary | ICD-10-CM | POA: Diagnosis not present

## 2023-04-28 DIAGNOSIS — R55 Syncope and collapse: Secondary | ICD-10-CM | POA: Diagnosis not present

## 2023-04-28 DIAGNOSIS — R42 Dizziness and giddiness: Secondary | ICD-10-CM | POA: Diagnosis not present

## 2023-04-28 DIAGNOSIS — I10 Essential (primary) hypertension: Secondary | ICD-10-CM | POA: Diagnosis not present

## 2023-04-28 NOTE — Patient Instructions (Signed)
 Medication Instructions:   Your physician recommends that you continue on your current medications as directed. Please refer to the Current Medication list given to you today.  *If you need a refill on your cardiac medications before your next appointment, please call your pharmacy*   Lab Work:  None Ordered  If you have labs (blood work) drawn today and your tests are completely normal, you will receive your results only by: MyChart Message (if you have MyChart) OR A paper copy in the mail If you have any lab test that is abnormal or we need to change your treatment, we will call you to review the results.   Testing/Procedures:  1 none Ordered   Follow-Up: At [facility], you and your health needs are our priority.  As part of our continuing mission to provide you with exceptional heart care, we have created designated Provider Care Teams.  These Care Teams include your primary Cardiologist (physician) and Advanced Practice Providers (APPs -  Physician Assistants and Nurse Practitioners) who all work together to provide you with the care you need, when you need it.  We recommend signing up for the patient portal called MyChart.  Sign up information is provided on this After Visit Summary.  MyChart is used to connect with patients for Virtual Visits (Telemedicine).  Patients are able to view lab/test results, encounter notes, upcoming appointments, etc.  Non-urgent messages can be sent to your provider as well.   To learn more about what you can do with MyChart, go to [email or web address].    Your next appointment:   12 month(s)  Provider:   You may see Deatrice Cage, MD or one of the following Advanced Practice Providers on your designated Care Team:   Lonni Meager, NP Bernardino Bring, PA-C Cadence Franchester, PA-C Tylene Lunch, NP Barnie Hila, NP

## 2023-05-01 ENCOUNTER — Telehealth: Payer: Self-pay

## 2023-05-01 NOTE — Telephone Encounter (Signed)
Received Novo PAP medication.    [name] 200 units x 3 boxes  Attempted to call pt, no answer and no voicemail set up.  Please try to call pt again today.    Medications placed in medication refrigerator.

## 2023-05-04 NOTE — Telephone Encounter (Signed)
Pt is aware and stated that his wife will pick it up tomorrow.

## 2023-05-05 ENCOUNTER — Telehealth: Payer: Self-pay

## 2023-05-05 NOTE — Telephone Encounter (Signed)
Pt has picked up medication.  

## 2023-05-05 NOTE — Telephone Encounter (Signed)
Patient is aware that there is 4 boxes of Patient assistance Ozempic medication for pick up.

## 2023-05-11 ENCOUNTER — Telehealth: Payer: Self-pay

## 2023-05-11 NOTE — Telephone Encounter (Signed)
Re-submitted patient on-line part of application for 2025 renewal to [employer].

## 2023-05-12 NOTE — Telephone Encounter (Signed)
PAP: Patient assistance application for Ozempic and Evaristo Bury has been approved by PAP Companies: NovoNordisk from 05/06/2023 to 05/05/2024. Medication should be delivered to PAP Delivery: Provider's office. For further shipping updates, please [employer] at [phone]. Patient ID is: not provided. Patient made aware via phone call- no mychart.

## 2023-05-12 NOTE — Progress Notes (Unsigned)
[name] [address] [place] [address] Phone: [phone] Subjective:   INadine Counts, am serving as a scribe for Dr. Antoine Primas.  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 male coming in with complaint of B knee pain. Last seen in Nov 2018. Patient states here for B knee pain. L knee is worse than R. Home therapies takes the edge off. Tylenol about 2000mg  a day  Patient does have what appears to be on ultrasound in November a suspicious mass of the left kidney patient had an MRI previously in November 2024 that did seem to be more of a renal cell carcinoma.  It had not changed size in 1 year.  Patient is being followed and they have discussed a another MRI in a year    Past Medical History:  Diagnosis Date   Arthritis    Depression    Diabetes mellitus with complication (HCC)    Diabetic neuropathy (HCC)    Diastolic dysfunction    a. TTE 07/2017: EF 60-65%, mild concentric LVH, no RWMA, Gr1DD, trivial AI, mildly dilated LA, RVSF normal, PASP normal   Edema    feet/legs   GERD (gastroesophageal reflux disease)    Gout    History of stress test    a. MV 06/2017: small in size, mild in severity, apical anterior and apical defect that was minimally reversible and most likely represented artifact and less likely ischemia/scar, LVEF 55-65%, low risk, probably normal stress test   Hypertension    Hypothyroidism    Kidney cysts    renal failure 2013   Kidney stones    Dr. Artis Flock   OSA (obstructive sleep apnea)    supplemental oxygen at night   Oxygen dependent    hs   [facility] spotted fever 12/10/2017   Positve IgG in titer on 11/20/17   S/P cardiac cath 1998   a. no obstructive disease   Syncope and collapse    Temporary low platelet count (HCC)    Past Surgical History:  Procedure Laterality Date   ANAL FISSURE REPAIR      BACK SURGERY  1977   rupture disc lumbar spine   CARDIAC CATHETERIZATION  1998   [facility] with [facility].    CATARACT EXTRACTION W/PHACO Right 07/16/2016   Procedure: CATARACT EXTRACTION PHACO AND INTRAOCULAR LENS PLACEMENT (IOC);  Surgeon: Sallee Lange, MD;  Location: [facility] ORS;  Service: Ophthalmology;  Laterality: Right;  [place] 01:11 AP% 17.6 CDE 24.83 fluid pack lot # [id number]   CATARACT EXTRACTION W/PHACO Left 08/13/2016   Procedure: CATARACT EXTRACTION PHACO AND INTRAOCULAR LENS PLACEMENT (IOC) suture placed in left eye at end of procedure;  Surgeon: Sallee Lange, MD;  Location: [facility] ORS;  Service: Ophthalmology;  Laterality: Left;  [place] 01:55 AP% 22.7 CDE 53.56 fluid pack lot # [id number]   FINGER AMPUTATION     partial   KNEE SURGERY  1993   arthroscopy   SHOULDER SURGERY Bilateral 1998   arthroscopic right, rotator cuff repair left   Social History   Socioeconomic History   Marital status: Married    Spouse name: [place]   Number of children: 3   Years of education: 13   Highest education level: Not on file  Occupational History    Comment: retired  Tobacco Use   Smoking status: Never  Smokeless tobacco: Never  Vaping Use   Vaping status: Never Used  Substance and Sexual Activity   Alcohol use: No   Drug use: No   Sexual activity: Yes  Other Topics Concern   Not on file  Social History Narrative   Lives in [place] with his wife ([place]). No children. Three step children. No pets.      Work - Patient is retired. Maintenance supervisor.      School - One year college education.      Right handed.      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               Social Drivers of Health   Financial Resource Strain: Low Risk  (08/19/2022)   Overall Financial Resource Strain (CARDIA)    Difficulty of Paying Living Expenses: Not hard at all  Food Insecurity: No Food Insecurity (08/19/2022)   Hunger Vital Sign     Worried About Running Out of Food in the Last Year: Never true    Ran Out of Food in the Last Year: Never true  Transportation Needs: No Transportation Needs (08/19/2022)   PRAPARE - Administrator, Civil Service (Medical): No    Lack of Transportation (Non-Medical): No  Physical Activity: Insufficiently Active (08/19/2022)   Exercise Vital Sign    Days of Exercise per Week: 7 days    Minutes of Exercise per Session: 20 min  Stress: No Stress Concern Present (08/19/2022)   [employer] of Occupational Health - Occupational Stress Questionnaire    Feeling of Stress : Not at all  Social Connections: Socially Integrated (08/19/2022)   Social Connection and Isolation Panel [NHANES]    Frequency of Communication with Friends and Family: More than three times a week    Frequency of Social Gatherings with Friends and Family: More than three times a week    Attends Religious Services: More than 4 times per year    Active Member of [employer] or Organizations: Yes    Attends Engineer, structural: More than 4 times per year    Marital Status: Married   Allergies  Allergen Reactions   Bee Venom Swelling   Dye Fdc Red [Red Dye #40 (Allura Red)] Swelling and Other (See Comments)    Reaction: gout   Omeprazole Other (See Comments)    Other reaction(s): increased sx on high dose   Atenolol Other (See Comments)    Did not regulate blood pressure   Benadryl [Diphenhydramine Hcl (Sleep)] Other (See Comments)    Hyperactivity    Enalapril Itching    itching   Gabapentin Palpitations and Rash    rash   Hctz [Hydrochlorothiazide] Other (See Comments)    Decreased potassium   Family History  Problem Relation Age of Onset   Breast cancer Mother    Lung cancer Mother    Bone cancer Mother    Heart Problems Mother    Cancer Mother        breast, lung and rib   AAA (abdominal aortic aneurysm) Mother    Heart disease Mother    Heart attack Father    Heart disease Father     Cancer Brother        esophageal   Heart attack Brother    Colon cancer Neg Hx     Current Outpatient Medications (Endocrine & Metabolic):    calcitRIOL (ROCALTROL) 0.25 MCG capsule, Take 1 capsule (0.25 mcg total) by mouth daily.  dapagliflozin propanediol ([name]) 10 MG TABS tablet, Take 1 tablet (10 mg total) by mouth daily.   insulin degludec ([name] FLEXTOUCH) 200 UNIT/ML FlexTouch Pen, Inject 40 Units into the skin daily.   levothyroxine (SYNTHROID) 100 MCG tablet, TAKE 1 TABLET(100 MCG) BY MOUTH DAILY BEFORE BREAKFAST   Semaglutide, 1 MG/DOSE, (OZEMPIC, 1 MG/DOSE,) 4 MG/3ML SOPN, Inject 1 mg into the skin once a week. (Patient not taking: Reported on 04/28/2023)   testosterone cypionate (DEPOTESTOSTERONE CYPIONATE) 200 MG/ML injection, Inject 200 mg into the muscle every 14 (fourteen) days.  Current Outpatient Medications (Cardiovascular):    carvedilol (COREG) 6.25 MG tablet, TAKE 1 TABLET(6.25 MG) BY MOUTH TWICE DAILY WITH A MEAL   doxazosin (CARDURA) 4 MG tablet, Take 1 tablet (4 mg total) by mouth daily.   furosemide (LASIX) 40 MG tablet, TAKE 1 TABLET(40 MG) BY MOUTH DAILY   losartan (COZAAR) 25 MG tablet, TAKE 1 TABLET BY MOUTH EVERY MORNING THEN TAKE 2 TABLETS BY MOUTH EVERY EVENING   simvastatin (ZOCOR) 40 MG tablet, TAKE 1 TABLET(40 MG) BY MOUTH AT BEDTIME (Patient not taking: Reported on 04/28/2023)   tadalafil (CIALIS) 20 MG tablet, Take by mouth.  Current Outpatient Medications (Respiratory):    loratadine (CLARITIN) 10 MG tablet, Take 10 mg by mouth daily as needed for allergies.   mometasone (NASONEX) 50 MCG/ACT nasal spray, Place 2 sprays into the nose daily.  Current Outpatient Medications (Analgesics):    acetaminophen (TYLENOL) 500 MG tablet, Take 500 mg by mouth every 6 (six) hours as needed.   allopurinol (ZYLOPRIM) 300 MG tablet, Take 300 mg by mouth daily as needed.   aspirin 81 MG tablet, Take 1 tablet (81 mg total) by mouth daily.    HYDROcodone-acetaminophen (NORCO/VICODIN) 5-325 MG tablet, Take 1 tablet by mouth every 6 (six) hours as needed for moderate pain.   meloxicam (MOBIC) 15 MG tablet, Take 1 tablet (15 mg total) by mouth daily.  Current Outpatient Medications (Hematological):    ferrous sulfate 325 (65 FE) MG tablet, Take 1 tablet (325 mg total) by mouth daily with breakfast. Please dispense dye free formulation. (Patient not taking: Reported on 04/28/2023)  Current Outpatient Medications (Other):    B Complex Vitamins (BL VITAMIN B COMPLEX PO), Take by mouth daily.   Cholecalciferol (VITAMIN D3 PO), Take by mouth daily.   clotrimazole-betamethasone (LOTRISONE) cream, Apply 1 application. topically 2 (two) times daily.   clove oil liquid, Apply 1 application topically as needed.   cyclobenzaprine (FLEXERIL) 10 MG tablet, Take 1 tablet by mouth as needed.   DULoxetine (CYMBALTA) 60 MG capsule, TAKE 1 CAPSULE(60 MG) BY MOUTH DAILY   esomeprazole (NEXIUM) 40 MG capsule, Take 1 capsule (40 mg total) by mouth daily at 12 noon.   finasteride (PROSCAR) 5 MG tablet, TAKE 1 TABLET(5 MG) BY MOUTH AT BEDTIME   gentamicin cream (GARAMYCIN) 0.1 %, Apply 1 application. topically 2 (two) times daily.   Insulin Pen Needle (NOVOFINE PEN NEEDLE) 32G X 6 MM MISC, 1 each by Does not apply route 2 (two) times daily.   MAGNESIUM PO, Take by mouth daily at 8 pm.   Multiple Vitamins-Minerals (MULTIVITAMIN MEN PO), Take 1 tablet by mouth daily.   Needle, Disp, (HYPODERMIC NEEDLE 18GX1") 18G X 1" MISC, Use every 14 days to draw up testosterone   Needle, Disp, (HYPODERMIC NEEDLE 25GX1") 25G X 1" MISC, Use every 14 days to administer testosterone   pregabalin (LYRICA) 200 MG capsule, TAKE 1 CAPSULE(200 MG) BY MOUTH TWICE DAILY  sulfamethoxazole-trimethoprim (BACTRIM DS) 800-160 MG tablet, Take 1 tablet by mouth 2 (two) times daily. (Patient not taking: Reported on 04/28/2023)   Reviewed prior external information including notes and  imaging from  primary care provider As well as notes that were available from care everywhere and other healthcare systems.  Past medical history, social, surgical and family history all reviewed in electronic medical record.  No pertanent information unless stated regarding to the chief complaint.   Review of Systems:  No headache, visual changes, nausea, vomiting, diarrhea, constipation, dizziness, abdominal pain, skin rash, fevers, chills, night sweats, weight loss, swollen lymph nodes, body aches, joint swelling, chest pain, shortness of breath, mood changes. POSITIVE muscle aches  Objective  Blood pressure 122/68, pulse 87, height 5\' 11"  (1.803 m), weight 263 lb (119.3 kg), SpO2 94%.   General: No apparent distress alert and oriented x3 mood and affect normal, dressed appropriately.  HEENT: Pupils equal, extraocular movements intact  Respiratory: Patient's speak in full sentences and does not appear short of breath  Cardiovascular: No lower extremity edema, non tender, no erythema  Knee exam shows arthritic changes noted.  Patient does have severe crepitus noted left greater than right.  Patient does have a varus deformity noted of the left knee.  Instability with valgus and varus force.  After informed written and verbal consent, patient was seated on exam table. Right knee was prepped with alcohol swab and utilizing anterolateral approach, patient's right knee space was injected with 4:1  marcaine 0.5%: Kenalog 40mg /dL. Patient tolerated the procedure well without immediate complications.  After informed written and verbal consent, patient was seated on exam table. Left knee was prepped with alcohol swab and utilizing anterolateral approach, patient's left knee space was injected with 4:1  marcaine 0.5%: Kenalog 40mg /dL. Patient tolerated the procedure well without immediate complications.   Impression and Recommendations:    The above documentation has been reviewed and is accurate  and complete Judi Saa, DO

## 2023-05-13 ENCOUNTER — Ambulatory Visit (INDEPENDENT_AMBULATORY_CARE_PROVIDER_SITE_OTHER): Payer: PPO

## 2023-05-13 ENCOUNTER — Ambulatory Visit: Payer: PPO | Admitting: Family Medicine

## 2023-05-13 ENCOUNTER — Encounter: Payer: Self-pay | Admitting: Family Medicine

## 2023-05-13 VITALS — BP 122/68 | HR 87 | Ht 71.0 in | Wt 263.0 lb

## 2023-05-13 DIAGNOSIS — M170 Bilateral primary osteoarthritis of knee: Secondary | ICD-10-CM

## 2023-05-13 DIAGNOSIS — G8929 Other chronic pain: Secondary | ICD-10-CM

## 2023-05-13 DIAGNOSIS — M25561 Pain in right knee: Secondary | ICD-10-CM

## 2023-05-13 DIAGNOSIS — M25562 Pain in left knee: Secondary | ICD-10-CM

## 2023-05-13 NOTE — Patient Instructions (Addendum)
Irena Cords Rep will contact you  Xrays today Injections today See you again in 6 weeks We'll get gel approval Check blood sugar next 3 days

## 2023-05-13 NOTE — Assessment & Plan Note (Signed)
Has been quite sometime since he has had injections.  Has had severe amount of arthritic changes for some time.  Will repeat x-rays to further evaluate with last ones being in 2020.  Discussed with patient about the importance of weight loss.  Patient is a diabetic with peripheral neuropathy and we will monitor blood sugars a little closer.  Do think that patient could be a good candidate for viscosupplementation as well.  Patient wants to avoid any surgical intervention if possible.  Encouraged him to get BMI at least under 35.  Follow-up again in 6 to 8 weeks

## 2023-05-14 ENCOUNTER — Telehealth: Payer: Self-pay

## 2023-05-14 NOTE — Telephone Encounter (Signed)
Patient scheduled 06/24/23   MONOVISC authorized for bilateral knee  Deductible does not apply Once the OOP os met patient is covered at 100% Patient has a $20 copay at time of visit  Only one copay per DOS PA not required  Document scanned

## 2023-05-18 ENCOUNTER — Telehealth: Payer: Self-pay

## 2023-05-18 ENCOUNTER — Ambulatory Visit (INDEPENDENT_AMBULATORY_CARE_PROVIDER_SITE_OTHER): Payer: PPO

## 2023-05-18 DIAGNOSIS — E538 Deficiency of other specified B group vitamins: Secondary | ICD-10-CM

## 2023-05-18 MED ORDER — CYANOCOBALAMIN 1000 MCG/ML IJ SOLN
1000.0000 ug | Freq: Once | INTRAMUSCULAR | Status: AC
Start: 2023-05-18 — End: 2023-05-18
  Administered 2023-05-18: 1000 ug via INTRAMUSCULAR

## 2023-05-18 NOTE — Progress Notes (Signed)
 Pt presented for their vitamin B12 injection. Pt was identified through two identifiers. Pt tolerated shot well in their left deltoid.

## 2023-05-18 NOTE — Telephone Encounter (Signed)
Spoke to pt's wife. To notify we received pen needles but not the medications. Pt will pick up today during his nurse visit.

## 2023-05-19 ENCOUNTER — Ambulatory Visit
Payer: PPO | Attending: Student in an Organized Health Care Education/Training Program | Admitting: Student in an Organized Health Care Education/Training Program

## 2023-05-19 ENCOUNTER — Encounter: Payer: Self-pay | Admitting: Student in an Organized Health Care Education/Training Program

## 2023-05-19 VITALS — BP 91/68 | HR 85 | Temp 97.2°F | Resp 16 | Ht 71.5 in | Wt 265.0 lb

## 2023-05-19 DIAGNOSIS — M48062 Spinal stenosis, lumbar region with neurogenic claudication: Secondary | ICD-10-CM | POA: Diagnosis not present

## 2023-05-19 DIAGNOSIS — E1140 Type 2 diabetes mellitus with diabetic neuropathy, unspecified: Secondary | ICD-10-CM | POA: Diagnosis not present

## 2023-05-19 DIAGNOSIS — M170 Bilateral primary osteoarthritis of knee: Secondary | ICD-10-CM

## 2023-05-19 DIAGNOSIS — M47816 Spondylosis without myelopathy or radiculopathy, lumbar region: Secondary | ICD-10-CM | POA: Diagnosis not present

## 2023-05-19 DIAGNOSIS — M5136 Other intervertebral disc degeneration, lumbar region with discogenic back pain only: Secondary | ICD-10-CM | POA: Diagnosis not present

## 2023-05-19 DIAGNOSIS — G8929 Other chronic pain: Secondary | ICD-10-CM

## 2023-05-19 DIAGNOSIS — M5416 Radiculopathy, lumbar region: Secondary | ICD-10-CM | POA: Insufficient documentation

## 2023-05-19 NOTE — Progress Notes (Signed)
 Patient: Douglas [name]  Service Category: E/M  Provider: Wallie Sherry, MD  DOB: 1945-07-15  DOS: 05/19/2023  Referring Provider: Maribeth Camellia MATSU, MD  MRN: [id number]  Setting: Ambulatory outpatient  PCP: Maribeth Camellia MATSU, MD  Type: New Patient  Specialty: Interventional Pain Management    Location: Office  Delivery: Face-to-face     Primary Reason(s) for Visit: Encounter for initial evaluation of one or more chronic problems (new to examiner) potentially causing chronic pain, and posing a threat to normal musculoskeletal function. (Level of risk: High) CC: Back Pain (Lumbar bilateral ), Hand Pain (PN), Foot Pain (PN ), Shoulder Pain (Left ), and Elbow Pain (Bilateral )  HPI  Mr. Chaplin is a 78 y.o. year old, male patient, who comes for the first time to our practice referred by Maribeth Camellia MATSU, MD for our initial evaluation of his chronic pain. He has Chronic kidney disease; DM type 2 with diabetic peripheral neuropathy (HCC); Hypothyroidism; Chronic painful diabetic neuropathy (HCC); Obesity (BMI 30-39.9); Chronic back pain greater than 3 months duration; Gout; OSA (obstructive sleep apnea); Skin lesion; Low testosterone ; Essential hypertension; Allergic conjunctivitis; Hyperlipidemia; BPH (benign prostatic hyperplasia); Knee osteoarthritis; Degenerative arthritis of knee, bilateral; Abdominal pain; Clavicle enlargement; Anxiety and depression; Cervical radiculopathy; Pain in toes of both feet; Left shoulder pain; Allergic rhinitis; Tick bite of knee, initial encounter; Erectile dysfunction due to arterial insufficiency; Right kidney mass; Anemia in chronic kidney disease; Secondary hyperparathyroidism of renal origin (HCC); Skin exam, screening for cancer; Abnormal results of liver function studies; Esophageal reflux; Low back pain; Other testicular hypofunction; Restless legs syndrome; Toe infection; Tendinopathy of left rotator cuff; Erectile dysfunction; Iron deficiency; Pain of great  toe; Blister (nonthermal), left great toe, initial encounter; Syncope; Memory difficulties; Flatulence; DOE (dyspnea on exertion); Ingrown toenail; Spinal stenosis, lumbar region, with neurogenic claudication; Chronic radicular lumbar pain; Lumbar spondylosis; and Degeneration of intervertebral disc of lumbar region with discogenic back pain on their problem list. Today he comes in for evaluation of his Back Pain (Lumbar bilateral ), Hand Pain (PN), Foot Pain (PN ), Shoulder Pain (Left ), and Elbow Pain (Bilateral )  Pain Assessment: Location: Lower, Left, Right Back Radiating: down both hips into backs legs into the sides of feet and into toes on left side. Onset: More than a month ago Duration: Chronic pain Quality: Discomfort, Throbbing, Tightness, Constant Severity: 8 /10 (subjective, self-reported pain score)  Effect on ADL: sleep disruption, weak legs, unable to walk or maintain activity.  he is addressing knee pain and has had 2 injections this week at [facility] Timing: Constant Modifying factors: laying down on a hard surface with knees positioned on a pillow BP: 91/68  HR: 85  Onset and Duration: Present longer than 3 months Cause of pain:  not noted Severity: No change since onset, NAS-11 at its worse: 10/10, NAS-11 at its best: 5/10, NAS-11 now: 8/10, and NAS-11 on the average: 7/10 Timing: Morning, Noon, Afternoon, Evening, Night, Not influenced by the time of the day, During activity or exercise, and After activity or exercise Aggravating Factors: Bending, Kneeling, Lifiting, Motion, Prolonged sitting, Prolonged standing, Squatting, Stooping , Walking, and Walking downhill Alleviating Factors: Stretching, Cold packs, Hot packs, Lying down, Medications, Nerve blocks, Resting, Sitting, Sleeping, and Warm showers or baths Associated Problems: Dizziness, Erectile dysfunction, Fatigue, Impotence, Inability to concentrate, Numbness, Personality changes, Spasms, Swelling, Tingling,  Weakness, Pain that wakes patient up, and Pain that does not allow patient to sleep Quality of Pain: Agonizing, Burning, Constant,  Intermittent, Disabling, Exhausting, Getting longer, Heavy, Itching, Nagging, Pulsating, Sharp, Shooting, Stabbing, Throbbing, Tingling, and Toothache-like Previous Examinations or Tests: CT scan, MRI scan, Nerve block, X-rays, Nerve conduction test, and Neurological evaluation Previous Treatments: Chiropractic manipulations, Epidural steroid injections, Narcotic medications, Physical Therapy, and Steroid treatments by mouth  Mr. Stead is being evaluated for possible interventional pain management therapies for the treatment of his chronic pain.  Discussed the use of AI scribe software for clinical note transcription with the patient, who gave verbal consent to proceed.  History of Present Illness   REY DANSBY is a 78 year old male with chronic back pain who presents for pain management consultation.   He has chronic back pain radiating from his low back to his hips, the back of his legs, down the sides of his calves, and into his feet bilaterally. On the left side, the pain extends into the four toes, with significant pain in the two big toes around the toenails. He experiences a sensation of tiredness in his legs and has difficulty sleeping at night due to the pain. He has a history of spine surgery in 1977 and has experienced back pain for most of his life. The pain radiates down his right leg, past the knee, to the side of the foot and toes. He has undergone physical therapy in the past, most recently last year for balance issues, but discontinued due to worsening knee pain. He has tried various medications for nerve pain, including Lyrica , gabapentin, and currently takes pregabalin  200 mg in the morning and at night. He also uses Flexeril  and takes Tylenol  1000 mg in the morning and at night.  He experiences burning and tingling in his feet, with sharp  pains, and a sensation of 'knots' in his hands. No sharp pains in his hands but notes arthritis in his small finger and stinging and burning in the fingertips. He is currently on pregabalin  for nerve pain.  For knee pain, he received injections in both knees and is scheduled to see a brace technician. He has a history of shoulder issues, with surgeries performed resulting in loss of strength in the left shoulder, which he attributes to post-COVID complications.  He has a history of a toe injury from taping, which required wound management for six weeks. He plans to see a podiatrist for toenail removal.       Meds   Current Outpatient Medications:    acetaminophen  (TYLENOL ) 500 MG tablet, Take 500 mg by mouth every 6 (six) hours as needed., Disp: , Rfl:    allopurinol  (ZYLOPRIM ) 300 MG tablet, Take 300 mg by mouth daily as needed., Disp: , Rfl:    aspirin  81 MG tablet, Take 1 tablet (81 mg total) by mouth daily. (Patient taking differently: Take 81 mg by mouth daily. Reports that he is taking 325 mg), Disp: , Rfl:    B Complex Vitamins (BL VITAMIN B COMPLEX PO), Take by mouth daily., Disp: , Rfl:    calcitRIOL  (ROCALTROL ) 0.25 MCG capsule, Take 1 capsule (0.25 mcg total) by mouth daily., Disp: 30 capsule, Rfl: 5   carvedilol  (COREG ) 6.25 MG tablet, TAKE 1 TABLET(6.25 MG) BY MOUTH TWICE DAILY WITH A MEAL, Disp: 180 tablet, Rfl: 2   Cholecalciferol  (VITAMIN D3 PO), Take by mouth daily., Disp: , Rfl:    clotrimazole -betamethasone  (LOTRISONE ) cream, Apply 1 application. topically 2 (two) times daily., Disp: 45 g, Rfl: 1   clove oil liquid, Apply 1 application topically as needed., Disp: , Rfl:  cyclobenzaprine  (FLEXERIL ) 10 MG tablet, Take 1 tablet by mouth as needed., Disp: , Rfl:    dapagliflozin  propanediol ([name] ) 10 MG TABS tablet, Take 1 tablet (10 mg total) by mouth daily., Disp: 90 tablet, Rfl: 3   doxazosin  (CARDURA ) 4 MG tablet, Take 1 tablet (4 mg total) by mouth daily., Disp: 90  tablet, Rfl: 1   DULoxetine  (CYMBALTA ) 60 MG capsule, TAKE 1 CAPSULE(60 MG) BY MOUTH DAILY, Disp: 90 capsule, Rfl: 3   esomeprazole  (NEXIUM ) 40 MG capsule, Take 1 capsule (40 mg total) by mouth daily at 12 noon., Disp: 90 capsule, Rfl: 3   finasteride  (PROSCAR ) 5 MG tablet, TAKE 1 TABLET(5 MG) BY MOUTH AT BEDTIME (Patient taking differently: 5 mg at bedtime as needed. TAKE 1 TABLET(5 MG) BY MOUTH AT BEDTIME), Disp: 90 tablet, Rfl: 3   furosemide  (LASIX ) 40 MG tablet, TAKE 1 TABLET(40 MG) BY MOUTH DAILY, Disp: 90 tablet, Rfl: 1   gabapentin (NEURONTIN) 100 MG capsule, Take 100 mg by mouth 3 (three) times daily. Takes 200 mg in the morning and 200 mg at bedtime, Disp: , Rfl:    insulin  degludec ([name]  FLEXTOUCH) 200 UNIT/ML FlexTouch Pen, Inject 40 Units into the skin daily., Disp: 15 mL, Rfl: 3   Insulin  Pen Needle (NOVOFINE PEN NEEDLE) 32G X 6 MM MISC, 1 each by Does not apply route 2 (two) times daily., Disp: 1 each, Rfl: 3   levothyroxine  (SYNTHROID ) 100 MCG tablet, TAKE 1 TABLET(100 MCG) BY MOUTH DAILY BEFORE BREAKFAST, Disp: 90 tablet, Rfl: 1   loratadine  (CLARITIN ) 10 MG tablet, Take 10 mg by mouth daily as needed for allergies., Disp: , Rfl:    losartan  (COZAAR ) 25 MG tablet, TAKE 1 TABLET BY MOUTH EVERY MORNING THEN TAKE 2 TABLETS BY MOUTH EVERY EVENING, Disp: 270 tablet, Rfl: 3   MAGNESIUM  PO, Take 400 mg by mouth daily at 8 pm. 1 tab in the a.m. and 2 at bedtime, Disp: , Rfl:    mometasone  (NASONEX ) 50 MCG/ACT nasal spray, Place 2 sprays into the nose daily., Disp: 1 each, Rfl: 12   Multiple Vitamins-Minerals (MULTIVITAMIN MEN PO), Take 1 tablet by mouth daily., Disp: , Rfl:    Needle, Disp, (HYPODERMIC NEEDLE 18GX1) 18G X 1 MISC, Use every 14 days to draw up testosterone , Disp: , Rfl:    Needle, Disp, (HYPODERMIC NEEDLE 25GX1) 25G X 1 MISC, Use every 14 days to administer testosterone , Disp: , Rfl:    testosterone  cypionate (DEPOTESTOSTERONE CYPIONATE) 200 MG/ML injection, Inject 200  mg into the muscle every 14 (fourteen) days., Disp: , Rfl:    ferrous sulfate  325 (65 FE) MG tablet, Take 1 tablet (325 mg total) by mouth daily with breakfast. Please dispense dye free formulation. (Patient not taking: Reported on 05/19/2023), Disp: 90 tablet, Rfl: 1   gentamicin  cream (GARAMYCIN ) 0.1 %, Apply 1 application. topically 2 (two) times daily. (Patient not taking: Reported on 05/19/2023), Disp: 30 g, Rfl: 1   HYDROcodone -acetaminophen  (NORCO/VICODIN) 5-325 MG tablet, Take 1 tablet by mouth every 6 (six) hours as needed for moderate pain. (Patient not taking: Reported on 05/19/2023), Disp: 30 tablet, Rfl: 0   meloxicam  (MOBIC ) 15 MG tablet, Take 1 tablet (15 mg total) by mouth daily. (Patient not taking: Reported on 05/19/2023), Disp: 30 tablet, Rfl: 1   pregabalin  (LYRICA ) 200 MG capsule, TAKE 1 CAPSULE(200 MG) BY MOUTH TWICE DAILY (Patient not taking: Reported on 05/19/2023), Disp: 180 capsule, Rfl: 0   Semaglutide , 1 MG/DOSE, (OZEMPIC , 1 MG/DOSE,) 4 MG/3ML  SOPN, Inject 1 mg into the skin once a week. (Patient not taking: Reported on 05/19/2023), Disp: 3 mL, Rfl: 3   simvastatin  (ZOCOR ) 40 MG tablet, TAKE 1 TABLET(40 MG) BY MOUTH AT BEDTIME (Patient not taking: Reported on 05/19/2023), Disp: 90 tablet, Rfl: 3   sulfamethoxazole -trimethoprim  (BACTRIM  DS) 800-160 MG tablet, Take 1 tablet by mouth 2 (two) times daily. (Patient not taking: Reported on 05/19/2023), Disp: 14 tablet, Rfl: 0   tadalafil (CIALIS) 20 MG tablet, Take by mouth. (Patient not taking: Reported on 05/19/2023), Disp: , Rfl:   Imaging Review  Cervical Imaging: Cervical MR wo contrast: Results for orders placed during the hospital encounter of 12/06/17  MR Cervical Spine Wo Contrast  Narrative CLINICAL DATA:  Neck pain over the last 2 months, radiating to the left shoulder. Patient also has headaches.  EXAM: MRI CERVICAL SPINE WITHOUT CONTRAST  TECHNIQUE: Multiplanar, multisequence MR imaging of the cervical spine was performed.  No intravenous contrast was administered.  COMPARISON:  Radiography 11/20/2017  FINDINGS: Alignment: Straightening of the normal cervical lordosis.  Vertebrae: No fracture or primary bone lesion. Prominent discogenic edema within the vertebral body endplates at T1-2, which could certainly be associated with cervicothoracic region pain.  Cord: No cord compression or primary cord lesion.  Posterior Fossa, vertebral arteries, paraspinal tissues: Negative  Disc levels:  Foramen magnum is widely patent. There is some degenerative change at the C1-2 articulation including some intraosseous cyst formation within the left lateral mass of C1, not clinically significant.  C2-3: Mild bulging of the disc. Facet osteoarthritis worse on the left than the right. No central canal stenosis. Foraminal stenosis left worse than right. Some potential for left C2 nerve compression.  C3-4: Facet osteoarthritis on the left. Bulging of the disc. No central canal stenosis. Foraminal stenosis left worse than right. Left C4 nerve compression could occur.  C4-5: Bulging of the disc more towards the left. Facet degeneration and hypertrophy worse on the left. Canal stenosis without cord compression. Foraminal narrowing on the left that could affect the C5 nerve.  C5-6: Endplate osteophytes and bulging of the disc. Facet hypertrophy on the left. No compressive central canal stenosis. Bilateral foraminal narrowing left worse than right. Either C6 nerve could be affected.  C6-7: Endplate osteophytes and bulging of the disc. No compressive canal or foraminal narrowing.  C7-T1: Endplate osteophytes and bulging of the disc more prominent on the left. Facet degeneration worse on the left. No central canal stenosis. Foraminal narrowing left worse than right. Some potential to affect the left C8 nerve.  T1-2: As noted above, there is pronounced discogenic edema within the anterior T1 and T2 endplate regions,  which could be associated with pain. Disc bulges mildly. There is facet osteoarthritis worse on the left. No compressive central canal stenosis. Foraminal encroachment on the left could affect the T1 nerve.  T2-3 was not studied in detail, but there is chronic disc degeneration at that level with foraminal stenosis right worse than left.  IMPRESSION: Complex multilevel degenerative changes throughout the cervical region.  No compressive central canal stenosis.  Facet arthropathy more pronounced on the left at C2-3, C3-4, C4-5 and C5-6 which could contribute to left-sided neck pain and cervicogenic pain syndromes. Foraminal stenosis that could cause neural compression on the left at C2-3, on the left at C3-4, on the left at C4-5, bilateral at C5-6, on the left at C7-T1, on the left at T1-2, and on the right at T2-3.  Discogenic edematous change of the T1  and T2 vertebral bodies which could be associated with cervicothoracic pain syndromes.   Electronically Signed By: Oneil Officer M.D. On: 12/06/2017 09:31  DG Cervical Spine Complete  Narrative CLINICAL DATA:  Pt states he has been having neck pain that radiates down both shoulder. Pt denies any injury but state he has had rotator cuff surgery.  EXAM: CERVICAL SPINE - COMPLETE 4+ VIEW  COMPARISON:  None.  FINDINGS: No fracture. No spondylolisthesis. There is generalized straightening of the normal cervical lordosis.  Moderate loss of disc height at C5-C6 and C6-C7 with prominent endplate osteophytes. Mild loss of disc height at C3-C4. There are anterior endplate osteophytes at C4-C5. Mild facet degenerative changes are noted on the left along the mid cervical spine.  Positioning limits assessment of the neural foramina.  Soft tissues are unremarkable.  IMPRESSION: 1. No fracture or acute finding. 2. Disk degenerative changes most evident at C5-C6 and C6-C7. Consider follow-up cervical MRI for further assessment  given the history of pain radiating down both shoulders.   Electronically Signed By: Alm Parkins M.D. On: 11/20/2017 18:20  DG Shoulder Left  Narrative Clinical Data: The shoulder pain  LEFT SHOULDER - 2+ VIEW  Comparison: None  Findings: The glenohumeral joint appears normal without degenerative change or evidence of previous dislocation.  The patient achieves good internal and external rotation.  The acromion is laterally pointed.  There is degenerative disease of the [facility] joint with inferiorly projecting osteophytes.  These findings could predispose to rotator cuff pathology.  Additionally, the [facility] joint could be symptomatic in and of itself.  IMPRESSION: The AC joint arthropathy.  Unfavorable acromial anatomy.  These findings could predispose to impingement.  Additionally, the [facility] joint could be painful in and of itself.  Provider: Macario Gay    MR Lumbar Spine Wo Contrast  Narrative CLINICAL DATA:  Low back pain, left worse than right  EXAM: MRI LUMBAR SPINE WITHOUT CONTRAST  TECHNIQUE: Multiplanar, multisequence MR imaging of the lumbar spine was performed. No intravenous contrast was administered.  COMPARISON:  None.  FINDINGS: Segmentation:  Standard.  Alignment: Levoscoliosis of the lumbar spine. 3 mm retrolisthesis of L5 on S1.  Vertebrae:  No fracture, evidence of discitis, or bone lesion.  Conus medullaris and cauda equina: Conus extends to the T12 level. Conus and cauda equina appear normal.  Paraspinal and other soft tissues: No acute paraspinal abnormality.  Disc levels:  Disc spaces: Degenerative disc disease with disc height loss at L2-3, L3-4, L4-5 and L5-S1. Osseous fusion across the L1-2 disc space.  T12-L1: Minimal broad-based disc bulge. No evidence of neural foraminal stenosis. No central canal stenosis.  L1-L2: Broad-based disc osteophyte complex. Mild bilateral facet arthropathy. Left lateral recess narrowing. Mild left  foraminal stenosis. No right foraminal stenosis. No central canal stenosis.  L2-L3: Broad-based disc bulge. Moderate bilateral facet arthropathy with ligamentum flavum infolding. Mild spinal stenosis. No evidence of neural foraminal stenosis.  L3-L4: Broad-based disc bulge flattening the ventral thecal sac. Moderate left and severe right facet arthropathy. Right lateral recess stenosis. Moderate right foraminal stenosis. Moderate-severe spinal stenosis.  L4-L5: Broad-based disc osteophyte complex with a small central disc protrusion. Moderate-severe bilateral facet arthropathy with ligamentum flavum infolding. Severe spinal stenosis. Moderate right foraminal stenosis. No left foraminal stenosis.  L5-S1: Prior right laminectomy. Broad-based disc bulge. Mild bilateral facet arthropathy. No evidence of neural foraminal stenosis. No central canal stenosis.  IMPRESSION: 1. Diffuse lumbar spine spondylosis as described above. 2. At L3-4 there is a broad-based  disc bulge flattening the ventral thecal sac. Moderate left and severe right facet arthropathy. Right lateral recess stenosis. Moderate right foraminal stenosis. Moderate-severe spinal stenosis. 3. At L4-5 there is a broad-based disc osteophyte complex with a small central disc protrusion. Moderate-severe bilateral facet arthropathy with ligamentum flavum infolding. Severe spinal stenosis. Moderate right foraminal stenosis.   Electronically Signed By: Julaine Blanch On: 12/17/2017 09:17   DG Knee Complete 4 Views Right  Narrative CLINICAL DATA:  78 year old who fell yesterday and injured the RIGHT knee. Initial encounter.  EXAM: RIGHT KNEE - COMPLETE 4+ VIEW  COMPARISON:  None.  FINDINGS: No evidence of acute fracture or dislocation. Severe narrowing of the MEDIAL compartment joint space with associated spurring. Mild patellofemoral and LATERAL compartment joint space narrowing. Well-preserved bone mineral density.  Moderate-sized joint effusion.  IMPRESSION: 1. No acute osseous abnormality. 2. Severe osteoarthritis involving the MEDIAL compartment. 3. Moderate-sized joint effusion.   Electronically Signed By: Debby Satterfield M.D. On: 12/10/2018 11:51   Complexity Note: Imaging results reviewed.                         ROS  Cardiovascular: Daily Aspirin  intake, High blood pressure, and Heart catheterization Pulmonary or Respiratory: Snoring  and Temporary stoppage of breathing during sleep Neurological: Abnormal skin sensations (Peripheral Neuropathy) and Curved spine Psychological-Psychiatric: Prone to panicking and Difficulty sleeping and or falling asleep Gastrointestinal: Reflux or heatburn Genitourinary: Kidney disease and Passing kidney stones Hematological: Brusing easily Endocrine: High blood sugar requiring insulin  (IDDM) and thyroid  disease Rheumatologic: No reported rheumatological signs and symptoms such as fatigue, joint pain, tenderness, swelling, redness, heat, stiffness, decreased range of motion, with or without associated rash Musculoskeletal: Negative for myasthenia gravis, muscular dystrophy, multiple sclerosis or malignant hyperthermia Work History: Retired  Allergies  Mr. Jenny is allergic to bee venom, dye fdc red [red dye #40 (allura red)], omeprazole, atenolol, benadryl [diphenhydramine hcl (sleep)], enalapril, gabapentin, and hctz [hydrochlorothiazide].  Laboratory Chemistry Profile   Renal Lab Results  Component Value Date   BUN 13 04/01/2023   CREATININE 1.57 (H) 04/01/2023   BCR 8 (L) 11/12/2018   GFR 42.15 (L) 04/01/2023   GFRAA 53 (L) 01/19/2019   GFRNONAA 46 (L) 01/19/2019   SPECGRAV 1.010 06/12/2017   PHUR 6.5 06/12/2017   PROTEINUR NEGATIVE 01/20/2019     Electrolytes Lab Results  Component Value Date   NA 136 04/01/2023   K 4.0 04/01/2023   CL 99 04/01/2023   CALCIUM 8.9 04/01/2023     Hepatic Lab Results  Component Value Date    AST 14 04/01/2023   ALT 13 04/01/2023   ALBUMIN 3.8 04/01/2023   ALKPHOS 74 04/01/2023   LIPASE 388 03/23/2012     ID Lab Results  Component Value Date   SARSCOV2NAA NEGATIVE 01/20/2019   HCVAB <0.1 06/07/2015   RMSFIGG DETECTED (A) 11/20/2017     Bone Lab Results  Component Value Date   VD25OH 83 05/02/2013   TESTOSTERONE  199.59 (L) 11/23/2019     Endocrine Lab Results  Component Value Date   GLUCOSE 182 (H) 04/01/2023   GLUCOSEU NEGATIVE 01/20/2019   HGBA1C 8.0 (H) 04/01/2023   TSH 2.10 04/01/2023   TESTOSTERONE  199.59 (L) 11/23/2019     Neuropathy Lab Results  Component Value Date   VITAMINB12 252 12/31/2022   FOLATE >23.8 03/17/2022   HGBA1C 8.0 (H) 04/01/2023     CNS No results found for: COLORCSF, APPEARCSF, RBCCOUNTCSF, WBCCSF, POLYSCSF, LYMPHSCSF, EOSCSF,  PROTEINCSF, GLUCCSF, JCVIRUS, CSFOLI, IGGCSF, LABACHR, ACETBL   Inflammation (CRP: Acute  ESR: Chronic) No results found for: CRP, ESRSEDRATE, LATICACIDVEN   Rheumatology Lab Results  Component Value Date   LABURIC 6.7 03/05/2022     Coagulation Lab Results  Component Value Date   PLT 218.0 04/16/2023     Cardiovascular Lab Results  Component Value Date   TROPONINI < 0.02 12/02/2012   HGB 12.7 (L) 04/16/2023   HCT 40.5 04/16/2023     Screening Lab Results  Component Value Date   SARSCOV2NAA NEGATIVE 01/20/2019   HCVAB <0.1 06/07/2015     Cancer No results found for: CEA, CA125, LABCA2   Allergens No results found for: ALMOND, APPLE, ASPARAGUS, AVOCADO, BANANA, BARLEY, BASIL, BAYLEAF, GREENBEAN, LIMABEAN, WHITEBEAN, BEEFIGE, REDBEET, BLUEBERRY, BROCCOLI, CABBAGE, MELON, CARROT, CASEIN, CASHEWNUT, CAULIFLOWER, CELERY     Note: Lab results reviewed.  PFSH  Drug: Mr. Wedel  reports no history of drug use. Alcohol :  reports no history of alcohol  use. Tobacco:  reports that he has never smoked. He  has never used smokeless tobacco. Medical:  has a past medical history of Arthritis, Depression, Diabetes mellitus with complication (HCC), Diabetic neuropathy (HCC), Diastolic dysfunction, Edema, GERD (gastroesophageal reflux disease), Gout, History of stress test, Hypertension, Hypothyroidism, Kidney cysts, Kidney stones, OSA (obstructive sleep apnea), Oxygen dependent, [place] spotted fever (12/10/2017), S/P cardiac cath (1998), Syncope and collapse, and Temporary low platelet count (HCC). Family: family history includes AAA (abdominal aortic aneurysm) in his mother; Bone cancer in his mother; Breast cancer in his mother; Cancer in his brother and mother; Heart Problems in his mother; Heart attack in his brother and father; Heart disease in his father and mother; Lung cancer in his mother.  Past Surgical History:  Procedure Laterality Date   ANAL FISSURE REPAIR     BACK SURGERY  1977   rupture disc lumbar spine   CARDIAC CATHETERIZATION  1998   [facility] with [facility].    CATARACT EXTRACTION W/PHACO Right 07/16/2016   Procedure: CATARACT EXTRACTION PHACO AND INTRAOCULAR LENS PLACEMENT (IOC);  Surgeon: Steven Dingeldein, MD;  Location: [facility] ORS;  Service: Ophthalmology;  Laterality: Right;  US  01:11 AP% 17.6 CDE 24.83 fluid pack lot # [id number]   CATARACT EXTRACTION W/PHACO Left 08/13/2016   Procedure: CATARACT EXTRACTION PHACO AND INTRAOCULAR LENS PLACEMENT (IOC) suture placed in left eye at end of procedure;  Surgeon: Steven Dingeldein, MD;  Location: [facility] OR[facility];  Service: Ophthalmology;  Laterality: Left;  US  01:55 AP% 22.7 CDE 53.56 fluid pack lot # [id number]   FINGER AMPUTATION     partial   KNEE SURGERY  1993   arthroscopy   SHOULDER SURGERY Bilateral 1998   arthroscopic right, rotator cuff repair left   Active Ambulatory Problems    Diagnosis Date Noted   Chronic kidney disease 01/04/2013   DM type 2 with diabetic peripheral neuropathy (HCC) 01/04/2013    Hypothyroidism 01/04/2013   Chronic painful diabetic neuropathy (HCC) 01/05/2013   Obesity (BMI 30-39.9) 01/05/2013   Chronic back pain greater than 3 months duration 01/05/2013   Gout 01/05/2013   OSA (obstructive sleep apnea) 01/05/2013   Skin lesion 11/24/2014   Low testosterone  02/27/2015   Essential hypertension 05/31/2015   Allergic conjunctivitis 08/30/2015   Hyperlipidemia 05/20/2016   BPH (benign prostatic hyperplasia) 08/18/2016   Knee osteoarthritis 08/18/2016   Degenerative arthritis of knee, bilateral 09/04/2016   Abdominal pain 02/18/2017   Clavicle enlargement 02/18/2017   Anxiety and depression 02/18/2017  Cervical radiculopathy 11/20/2017   Pain in toes of both feet 11/20/2017   Left shoulder pain 07/02/2018   Allergic rhinitis 07/02/2018   Tick bite of knee, initial encounter 08/17/2018   Erectile dysfunction due to arterial insufficiency 11/09/2018   Right kidney mass 01/20/2019   Anemia in chronic kidney disease 02/18/2019   Secondary hyperparathyroidism of renal origin (HCC) 02/18/2019   Skin exam, screening for cancer 03/02/2020   Abnormal results of liver function studies 08/10/2020   Esophageal reflux 08/10/2020   Low back pain 08/10/2020   Other testicular hypofunction 08/10/2020   Restless legs syndrome 08/10/2020   Toe infection 08/12/2021   Tendinopathy of left rotator cuff 11/18/2021   Erectile dysfunction 03/05/2022   Iron deficiency 04/17/2022   Pain of great toe 04/17/2022   Blister (nonthermal), left great toe, initial encounter 07/21/2022   Syncope 12/31/2022   Memory difficulties 12/31/2022   Flatulence 12/31/2022   DOE (dyspnea on exertion) 04/01/2023   Ingrown toenail 04/01/2023   Spinal stenosis, lumbar region, with neurogenic claudication 05/19/2023   Chronic radicular lumbar pain 05/19/2023   Lumbar spondylosis 05/19/2023   Degeneration of intervertebral disc of lumbar region with discogenic back pain 05/19/2023   Resolved  Ambulatory Problems    Diagnosis Date Noted   Hereditary and idiopathic peripheral neuropathy 09/15/2012   Type 1 diabetes mellitus with peripheral circulatory complications (HCC) 09/15/2012   Atypical nevus 01/11/2013   Hereditary and idiopathic peripheral neuropathy 03/17/2013   Thrombocytopenia (HCC) 02/20/2014   Bilateral conjunctivitis 08/22/2014   Diabetic peripheral neuropathy (HCC) 02/27/2015   Peripheral neuropathy 09/25/2015   Hordeolum externum (stye) 03/22/2016   Polyneuropathy 09/23/2016   Tick bite of back 08/19/2017   [facility] spotted fever 12/10/2017   Tick bite of calf, initial encounter 07/22/2018   Respiratory illness 11/05/2018   Acute kidney failure (HCC) 02/18/2019   Viral gastroenteritis 08/22/2019   Past Medical History:  Diagnosis Date   Arthritis    Depression    Diabetes mellitus with complication (HCC)    Diabetic neuropathy (HCC)    Diastolic dysfunction    Edema    GERD (gastroesophageal reflux disease)    History of stress test    Hypertension    Kidney cysts    Kidney stones    Oxygen dependent    S/P cardiac cath 1998   Syncope and collapse    Temporary low platelet count (HCC)    Constitutional Exam  General appearance: Well nourished, well developed, and well hydrated. In no apparent acute distress Vitals:   05/19/23 1030  BP: 91/68  Pulse: 85  Resp: 16  Temp: (!) 97.2 F (36.2 C)  TempSrc: Temporal  SpO2: 98%  Weight: 265 lb (120.2 kg)  Height: 5' 11.5 (1.816 m)   BMI Assessment: Estimated body mass index is 36.44 kg/m as calculated from the following:   Height as of this encounter: 5' 11.5 (1.816 m).   Weight as of this encounter: 265 lb (120.2 kg).  BMI interpretation table: BMI level Category Range association with higher incidence of chronic pain  <18 kg/m2 Underweight   18.5-24.9 kg/m2 Ideal body weight   25-29.9 kg/m2 Overweight Increased incidence by 20%  30-34.9 kg/m2 Obese (Class I) Increased incidence  by 68%  35-39.9 kg/m2 Severe obesity (Class II) Increased incidence by 136%  >40 kg/m2 Extreme obesity (Class III) Increased incidence by 254%   Patient's current BMI Ideal Body weight  Body mass index is 36.44 kg/m. Ideal body weight: 76.5 kg (168 lb  8.7 oz) Adjusted ideal body weight: 94 kg (207 lb 2 oz)   BMI Readings from Last 4 Encounters:  05/19/23 36.44 kg/m  05/13/23 36.68 kg/m  04/28/23 36.56 kg/m  04/01/23 35.72 kg/m   Wt Readings from Last 4 Encounters:  05/19/23 265 lb (120.2 kg)  05/13/23 263 lb (119.3 kg)  04/28/23 262 lb 2 oz (118.9 kg)  04/01/23 263 lb 6.4 oz (119.5 kg)    Psych/Mental status: Alert, oriented x 3 (person, place, & time)       Eyes: PERLA Respiratory: No evidence of acute respiratory distress  Cervical Spine Area Exam  Skin & Axial Inspection: No masses, redness, edema, swelling, or associated skin lesions Alignment: Symmetrical Functional ROM: Pain restricted ROM      Stability: No instability detected Muscle Tone/Strength: Functionally intact. No obvious neuro-muscular anomalies detected. Sensory (Neurological): Unimpaired Palpation: No palpable anomalies             Upper Extremity (UE) Exam    Side: Right upper extremity  Side: Left upper extremity  Skin & Extremity Inspection: Evidence of prior arthroplastic surgery  Skin & Extremity Inspection: Evidence of prior arthroplastic surgery  Functional ROM: Unrestricted ROM          Functional ROM: Pain restricted ROM for shoulder  Muscle Tone/Strength: Functionally intact. No obvious neuro-muscular anomalies detected.  Muscle Tone/Strength: Functionally intact. No obvious neuro-muscular anomalies detected.  Sensory (Neurological): Unimpaired          Sensory (Neurological): Unimpaired          Palpation: No palpable anomalies              Palpation: No palpable anomalies              Provocative Test(s):  Phalen's test: deferred Tinel's test: deferred Apley's scratch test (touch opposite  shoulder):  Action 1 (Across chest): deferred Action 2 (Overhead): deferred Action 3 (LB reach): deferred   Provocative Test(s):  Phalen's test: deferred Tinel's test: deferred Apley's scratch test (touch opposite shoulder):  Action 1 (Across chest): Decreased ROM Action 2 (Overhead): Decreased ROM Action 3 (LB reach): Decreased ROM    Thoracic Spine Area Exam  Skin & Axial Inspection: No masses, redness, or swelling Alignment: Symmetrical Functional ROM: Unrestricted ROM Stability: No instability detected Muscle Tone/Strength: Functionally intact. No obvious neuro-muscular anomalies detected. Sensory (Neurological): Unimpaired Muscle strength & Tone: No palpable anomalies Lumbar Spine Area Exam  Skin & Axial Inspection: Well healed scar from previous spine surgery detected Alignment: Symmetrical Functional ROM: Pain restricted ROM       Stability: No instability detected Muscle Tone/Strength: Functionally intact. No obvious neuro-muscular anomalies detected. Sensory (Neurological): Dermatomal pain pattern Palpation: No palpable anomalies       Provocative Tests: Hyperextension/rotation test: deferred today       Lumbar quadrant test (Kemp's test): (+) bilateral for foraminal stenosis Lateral bending test: (+) due to pain.  Gait & Posture Assessment  Ambulation: Unassisted Gait: Relatively normal for age and body habitus Posture: WNL  Lower Extremity Exam    Side: Right lower extremity  Side: Left lower extremity  Stability: No instability observed          Stability: No instability observed          Skin & Extremity Inspection: Skin color, temperature, and hair growth are WNL. No peripheral edema or cyanosis. No masses, redness, swelling, asymmetry, or associated skin lesions. No contractures.  Skin & Extremity Inspection: Skin color, temperature, and hair growth are WNL.  No peripheral edema or cyanosis. No masses, redness, swelling, asymmetry, or associated skin lesions. No  contractures.  Functional ROM: Decreased ROM for knee joint          Functional ROM: Decreased ROM for knee joint          Muscle Tone/Strength: Functionally intact. No obvious neuro-muscular anomalies detected.  Muscle Tone/Strength: Functionally intact. No obvious neuro-muscular anomalies detected.  Sensory (Neurological): Arthropathic arthralgia        Sensory (Neurological): Arthropathic arthralgia        DTR: Patellar: deferred today Achilles: deferred today Plantar: deferred today  DTR: Patellar: deferred today Achilles: deferred today Plantar: deferred today  Palpation: No palpable anomalies  Palpation: No palpable anomalies    Assessment  Primary Diagnosis & Pertinent Problem List: The primary encounter diagnosis was Spinal stenosis, lumbar region, with neurogenic claudication. Diagnoses of Chronic radicular lumbar pain, Lumbar facet arthropathy, Lumbar spondylosis, Degeneration of intervertebral disc of lumbar region with discogenic back pain, Chronic painful diabetic neuropathy (HCC), and Bilateral primary osteoarthritis of knee were also pertinent to this visit.  Visit Diagnosis (New problems to examiner): 1. Spinal stenosis, lumbar region, with neurogenic claudication   2. Chronic radicular lumbar pain   3. Lumbar facet arthropathy   4. Lumbar spondylosis   5. Degeneration of intervertebral disc of lumbar region with discogenic back pain   6. Chronic painful diabetic neuropathy (HCC)   7. Bilateral primary osteoarthritis of knee    Plan of Care (Initial workup plan)      Chronic Low Back Pain with Radiculopathy and lumbar facet arthropathy Chronic low back pain radiates to the hips, legs, and feet bilaterally, more severe on the right, associated with degenerative disc disease, severe facet arthritis, and spinal stenosis. Previous spine surgery occurred in 1977. An MRI from five years ago showed severe degeneration, and a new MRI is not expected to change management.  Current pain management includes Flexeril , Tylenol , and pregabalin . Potential treatments discussed include epidural injections for sciatica pain and nerve blocks for arthritic pain. Nerve blocks are diagnostic and, if effective, can be followed by ablation for long-term relief. Epidural injections, usually one or two, are considered before nerve blocks. Consider epidural injection for sciatica pain and nerve blocks for arthritic pain. If nerve blocks are effective, consider ablation for long-term relief.  Knee Osteoarthritis Bilateral knee pain is managed with injections by sports medicine. A brace technician appointment is scheduled, and gel injections may be received. If gel injections are ineffective, nerve blocks may be considered, generally after gel injections. Follow up with sports medicine for gel injections and consider nerve blocks if gel injections are ineffective.  Peripheral Neuropathy Burning, tingling, and sharp pains in feet and hands are likely due to peripheral neuropathy. Qutenza  treatment for diabetic nerve pain in the feet was discussed. Qutenza  is a 30-40 minute treatment done in clinic. Consider Qutenza  treatment for diabetic nerve pain in feet.  General Health Maintenance A podiatrist visit is needed for toenail issues. There are ongoing shoulder strength issues post-surgery. Strength-building exercises for the shoulder are encouraged. Refer to a podiatrist for toenail removal and encourage strength-building exercises for the shoulder.     Procedure Orders         NEUROLYSIS         Lumbar Epidural Injection      Interventional management options: Mr. Griesinger was informed that there is no guarantee that he would be a candidate for interventional therapies. The decision will be based on the results  of diagnostic studies, as well as Mr. Hedding risk profile.  Procedure(s) under consideration:  qutenza , esi, facets, SCS, GNB, SSNB     Provider-requested follow-up:  Return in about 8 days (around 05/27/2023) for L4/5 ESI + Qutenza , in clinic [facility].  Future Appointments  Date Time Provider Department Center  05/25/2023  2:00 PM [facility]  06/03/2023 10:00 AM Marcelino Nurse, MD [facility] None  06/15/2023  2:00 PM [facility]INICAL SU[facility]  06/24/2023 11:15 AM Claudene Arthea HERO, DO [facility] None  07/01/2023 10:00 AM Gretel App, NP [facility]  02/17/2024 11:00 AM Stoioff, Glendia BROCKS, MD [facility] None    Duration of encounter: .  Total time on encounter, as per AMA guidelines included both the face-to-face and non-face-to-face time personally spent by the physician and/or other qualified health care professional(s) on the day of the encounter (includes time in activities that require the physician or other qualified health care professional and does not include time in activities normally performed by clinical staff). Physician's time may include the following activities when performed: Preparing to see the patient (e.g., pre-charting review of records, searching for previously ordered imaging, lab work, and nerve conduction tests) Review of prior analgesic pharmacotherapies. Reviewing PMP Interpreting ordered tests (e.g., lab work, imaging, nerve conduction tests) Performing post-procedure evaluations, including interpretation of diagnostic procedures Obtaining and/or reviewing separately obtained history Performing a medically appropriate examination and/or evaluation Counseling and educating the patient/family/caregiver Ordering medications, tests, or procedures Referring and communicating with other health care professionals (when not separately reported) Documenting clinical information in the electronic or other health record Independently interpreting results (not separately reported) and communicating results to the patient/ family/caregiver Care coordination (not separately reported)  Note by: Nurse Marcelino, MD (AI and  TTS technology used. I apologize for any typographical errors that were not detected and corrected.) Date: 05/19/2023; Time: 1:59 PM

## 2023-05-19 NOTE — Telephone Encounter (Signed)
Updated on 05-11-23 Note

## 2023-05-19 NOTE — Patient Instructions (Signed)
GENERAL RISKS AND COMPLICATIONS  What are the risk, side effects and possible complications? Generally speaking, most procedures are safe.  However, with any procedure there are risks, side effects, and the possibility of complications.  The risks and complications are dependent upon the sites that are lesioned, or the type of nerve block to be performed.  The closer the procedure is to the spine, the more serious the risks are.  Great care is taken when placing the radio frequency needles, block needles or lesioning probes, but sometimes complications can occur. Infection: Any time there is an injection through the skin, there is a risk of infection.  This is why sterile conditions are used for these blocks.  There are four possible types of infection. Localized skin infection. Central Nervous System Infection-This can be in the form of Meningitis, which can be deadly. Epidural Infections-This can be in the form of an epidural abscess, which can cause pressure inside of the spine, causing compression of the spinal cord with subsequent paralysis. This would require an emergency surgery to decompress, and there are no guarantees that the patient would recover from the paralysis. Discitis-This is an infection of the intervertebral discs.  It occurs in about 1% of discography procedures.  It is difficult to treat and it may lead to surgery.        2. Pain: the needles have to go through skin and soft tissues, will cause soreness.       3. Damage to internal structures:  The nerves to be lesioned may be near blood vessels or    other nerves which can be potentially damaged.       4. Bleeding: Bleeding is more common if the patient is taking blood thinners such as  aspirin, Coumadin, Ticiid, Plavix, etc., or if he/she have some genetic predisposition  such as hemophilia. Bleeding into the spinal canal can cause compression of the spinal  cord with subsequent paralysis.  This would require an emergency  surgery to  decompress and there are no guarantees that the patient would recover from the  paralysis.       5. Pneumothorax:  Puncturing of a lung is a possibility, every time a needle is introduced in  the area of the chest or upper back.  Pneumothorax refers to free air around the  collapsed lung(s), inside of the thoracic cavity (chest cavity).  Another two possible  complications related to a similar event would include: Hemothorax and Chylothorax.   These are variations of the Pneumothorax, where instead of air around the collapsed  lung(s), you may have blood or chyle, respectively.       6. Spinal headaches: They may occur with any procedures in the area of the spine.       7. Persistent CSF (Cerebro-Spinal Fluid) leakage: This is a rare problem, but may occur  with prolonged intrathecal or epidural catheters either due to the formation of a fistulous  track or a dural tear.       8. Nerve damage: By working so close to the spinal cord, there is always a possibility of  nerve damage, which could be as serious as a permanent spinal cord injury with  paralysis.       9. Death:  Although rare, severe deadly allergic reactions known as "Anaphylactic  reaction" can occur to any of the medications used.      10. Worsening of the symptoms:  We can always make thing worse.  What are the chances  of something like this happening? Chances of any of this occuring are extremely low.  By statistics, you have more of a chance of getting killed in a motor vehicle accident: while driving to the hospital than any of the above occurring .  Nevertheless, you should be aware that they are possibilities.  In general, it is similar to taking a shower.  Everybody knows that you can slip, hit your head and get killed.  Does that mean that you should not shower again?  Nevertheless always keep in mind that statistics do not mean anything if you happen to be on the wrong side of them.  Even if a procedure has a 1 (one) in a  1,000,000 (million) chance of going wrong, it you happen to be that one..Also, keep in mind that by statistics, you have more of a chance of having something go wrong when taking medications.  Who should not have this procedure? If you are on a blood thinning medication (e.g. Coumadin, Plavix, see list of "Blood Thinners"), or if you have an active infection going on, you should not have the procedure.  If you are taking any blood thinners, please inform your physician.  How should I prepare for this procedure? Do not eat or drink anything at least six hours prior to the procedure. Bring a driver with you .  It cannot be a taxi. Come accompanied by an adult that can drive you back, and that is strong enough to help you if your legs get weak or numb from the local anesthetic. Take all of your medicines the morning of the procedure with just enough water to swallow them. If you have diabetes, make sure that you are scheduled to have your procedure done first thing in the morning, whenever possible. If you have diabetes, take only half of your insulin dose and notify our nurse that you have done so as soon as you arrive at the clinic. If you are diabetic, but only take blood sugar pills (oral hypoglycemic), then do not take them on the morning of your procedure.  You may take them after you have had the procedure. Do not take aspirin or any aspirin-containing medications, at least eleven (11) days prior to the procedure.  They may prolong bleeding. Wear loose fitting clothing that may be easy to take off and that you would not mind if it got stained with Betadine or blood. Do not wear any jewelry or perfume Remove any nail coloring.  It will interfere with some of our monitoring equipment.  NOTE: Remember that this is not meant to be interpreted as a complete list of all possible complications.  Unforeseen problems may occur.  BLOOD THINNERS The following drugs contain aspirin or other products,  which can cause increased bleeding during surgery and should not be taken for 2 weeks prior to and 1 week after surgery.  If you should need take something for relief of minor pain, you may take acetaminophen which is found in Tylenol,m Datril, Anacin-3 and Panadol. It is not blood thinner. The products listed below are.  Do not take any of the products listed below in addition to any listed on your instruction sheet.  A.P.C or A.P.C with Codeine Codeine Phosphate Capsules #3 Ibuprofen Ridaura  ABC compound Congesprin Imuran rimadil  Advil Cope Indocin Robaxisal  Alka-Seltzer Effervescent Pain Reliever and Antacid Coricidin or Coricidin-D  Indomethacin Rufen  Alka-Seltzer plus Cold Medicine Cosprin Ketoprofen S-A-C Tablets  Anacin Analgesic Tablets or Capsules Coumadin  Korlgesic Salflex  Anacin Extra Strength Analgesic tablets or capsules CP-2 Tablets Lanoril Salicylate  Anaprox Cuprimine Capsules Levenox Salocol  Anexsia-D Dalteparin Magan Salsalate  Anodynos Darvon compound Magnesium Salicylate Sine-off  Ansaid Dasin Capsules Magsal Sodium Salicylate  Anturane Depen Capsules Marnal Soma  APF Arthritis pain formula Dewitt's Pills Measurin Stanback  Argesic Dia-Gesic Meclofenamic Sulfinpyrazone  Arthritis Bayer Timed Release Aspirin Diclofenac Meclomen Sulindac  Arthritis pain formula Anacin Dicumarol Medipren Supac  Analgesic (Safety coated) Arthralgen Diffunasal Mefanamic Suprofen  Arthritis Strength Bufferin Dihydrocodeine Mepro Compound Suprol  Arthropan liquid Dopirydamole Methcarbomol with Aspirin Synalgos  ASA tablets/Enseals Disalcid Micrainin Tagament  Ascriptin Doan's Midol Talwin  Ascriptin A/D Dolene Mobidin Tanderil  Ascriptin Extra Strength Dolobid Moblgesic Ticlid  Ascriptin with Codeine Doloprin or Doloprin with Codeine Momentum Tolectin  Asperbuf Duoprin Mono-gesic Trendar  Aspergum Duradyne Motrin or Motrin IB Triminicin  Aspirin plain, buffered or enteric coated  Durasal Myochrisine Trigesic  Aspirin Suppositories Easprin Nalfon Trillsate  Aspirin with Codeine Ecotrin Regular or Extra Strength Naprosyn Uracel  Atromid-S Efficin Naproxen Ursinus  Auranofin Capsules Elmiron Neocylate Vanquish  Axotal Emagrin Norgesic Verin  Azathioprine Empirin or Empirin with Codeine Normiflo Vitamin E  Azolid Emprazil Nuprin Voltaren  Bayer Aspirin plain, buffered or children's or timed BC Tablets or powders Encaprin Orgaran Warfarin Sodium  Buff-a-Comp Enoxaparin Orudis Zorpin  Buff-a-Comp with Codeine Equegesic Os-Cal-Gesic   Buffaprin Excedrin plain, buffered or Extra Strength Oxalid   Bufferin Arthritis Strength Feldene Oxphenbutazone   Bufferin plain or Extra Strength Feldene Capsules Oxycodone with Aspirin   Bufferin with Codeine Fenoprofen Fenoprofen Pabalate or Pabalate-SF   Buffets II Flogesic Panagesic   Buffinol plain or Extra Strength Florinal or Florinal with Codeine Panwarfarin   Buf-Tabs Flurbiprofen Penicillamine   Butalbital Compound Four-way cold tablets Penicillin   Butazolidin Fragmin Pepto-Bismol   Carbenicillin Geminisyn Percodan   Carna Arthritis Reliever Geopen Persantine   Carprofen Gold's salt Persistin   Chloramphenicol Goody's Phenylbutazone   Chloromycetin Haltrain Piroxlcam   Clmetidine heparin Plaquenil   Cllnoril Hyco-pap Ponstel   Clofibrate Hydroxy chloroquine Propoxyphen         Before stopping any of these medications, be sure to consult the physician who ordered them.  Some, such as Coumadin (Warfarin) are ordered to prevent or treat serious conditions such as "deep thrombosis", "pumonary embolisms", and other heart problems.  The amount of time that you may need off of the medication may also vary with the medication and the reason for which you were taking it.  If you are taking any of these medications, please make sure you notify your pain physician before you undergo any procedures.         Epidural Steroid  Injection Patient Information  Description: The epidural space surrounds the nerves as they exit the spinal cord.  In some patients, the nerves can be compressed and inflamed by a bulging disc or a tight spinal canal (spinal stenosis).  By injecting steroids into the epidural space, we can bring irritated nerves into direct contact with a potentially helpful medication.  These steroids act directly on the irritated nerves and can reduce swelling and inflammation which often leads to decreased pain.  Epidural steroids may be injected anywhere along the spine and from the neck to the low back depending upon the location of your pain.   After numbing the skin with local anesthetic (like Novocaine), a small needle is passed into the epidural space slowly.  You may experience a sensation of pressure while this  is being done.  The entire block usually last less than 10 minutes.  Conditions which may be treated by epidural steroids:  Low back and leg pain Neck and arm pain Spinal stenosis Post-laminectomy syndrome Herpes zoster (shingles) pain Pain from compression fractures  Preparation for the injection:  Do not eat any solid food or dairy products within 8 hours of your appointment.  You may drink clear liquids up to 3 hours before appointment.  Clear liquids include water, black coffee, juice or soda.  No milk or cream please. You may take your regular medication, including pain medications, with a sip of water before your appointment  Diabetics should hold regular insulin (if taken separately) and take 1/2 normal NPH dos the morning of the procedure.  Carry some sugar containing items with you to your appointment. A driver must accompany you and be prepared to drive you home after your procedure.  Bring all your current medications with your. An IV may be inserted and sedation may be given at the discretion of the physician.   A blood pressure cuff, EKG and other monitors will often be applied  during the procedure.  Some patients may need to have extra oxygen administered for a short period. You will be asked to provide medical information, including your allergies, prior to the procedure.  We must know immediately if you are taking blood thinners (like Coumadin/Warfarin)  Or if you are allergic to IV iodine contrast (dye). We must know if you could possible be pregnant.  Possible side-effects: Bleeding from needle site Infection (rare, may require surgery) Nerve injury (rare) Numbness & tingling (temporary) Difficulty urinating (rare, temporary) Spinal headache ( a headache worse with upright posture) Light -headedness (temporary) Pain at injection site (several days) Decreased blood pressure (temporary) Weakness in arm/leg (temporary) Pressure sensation in back/neck (temporary)  Call if you experience: Fever/chills associated with headache or increased back/neck pain. Headache worsened by an upright position. New onset weakness or numbness of an extremity below the injection site Hives or difficulty breathing (go to the emergency room) Inflammation or drainage at the infection site Severe back/neck pain Any new symptoms which are concerning to you  Please note:  Although the local anesthetic injected can often make your back or neck feel good for several hours after the injection, the pain will likely return.  It takes 3-7 days for steroids to work in the epidural space.  You may not notice any pain relief for at least that one week.  If effective, we will often do a series of three injections spaced 3-6 weeks apart to maximally decrease your pain.  After the initial series, we generally will wait several months before considering a repeat injection of the same type.  If you have any questions, please call [phone] [facility] Clinic[facility]aps[facility] Top[facility] What is this medication? CAPSAICIN (cap SAY sin) treats nerve pain. It works by  making your skin feel warm or cool, which blocks pain signals going to the brain. This medicine may be used for other purposes; ask your health care provider or pharmacist if you have questions. COMMON BRAND NAME(S): Qutenza What should I tell my care team before I take this medication? They need to know if you have any of these conditions: Have had a heart attack or stroke High blood pressure Large areas of burned or damaged skin An unusual or allergic reaction to capsaicin, hot peppers, other medications, foods, dyes, or preservatives Pregnant or trying to get  pregnant Breastfeeding How should I use this medication? This medication is for external use only. It is applied by your care team in a hospital or clinic setting. Talk to your care team about the use of this medication in children. Special care may be needed. Overdosage: If you think you have taken too much of this medicine contact a poison control center or emergency room at once. NOTE: This medicine is only for you. Do not share this medicine with others. What if I miss a dose? This does not apply. What may interact with this medication? Interactions are not expected. Do not use any other skin products on the affected area without asking your care team. This list may not describe all possible interactions. Give your health care provider a list of all the medicines, herbs, non-prescription drugs, or dietary supplements you use. Also tell them if you smoke, drink alcohol, or use illegal drugs. Some items may interact with your medicine. What should I watch for while using this medication? Your condition will be monitored carefully while you are receiving this medication. Tell your care team if your symptoms do not start to get better or if they get worse. Talk to your care team about how to treat discomfort. You may place a cooling pack from the refrigerator (not the freezer) on the area. Do not place it directly on the skin. Try not  to touch the area where the patch was applied. If you do, wash your hands with soap and water right away. Your skin may be sensitive to heat for a few days after treatment. Avoid hot baths or showers, heating pads, and direct sunlight on the treated area. What side effects may I notice from receiving this medication? Side effects that you should report to your care team as soon as possible: Allergic reactions--skin rash, itching, hives, swelling of the face, lips, tongue, or throat Burning, itching, crusting, or peeling of treated skin Increase in blood pressure Numbness, decrease in sense of touch or sensation Side effects that usually do not require medical attention (report these to your care team if they continue or are bothersome): Mild skin irritation, redness, or dryness This list may not describe all possible side effects. Call your doctor for medical advice about side effects. You may report side effects to FDA at 1-800-FDA-1088. Where should I keep my medication? This medication is given in a hospital or clinic. It will not be stored at home. NOTE: This sheet is a summary. It may not cover all possible information. If you have questions about this medicine, talk to your doctor, pharmacist, or health care provider.  2024 [employer] Standard (2023-03-13 00:00:00)

## 2023-05-19 NOTE — Progress Notes (Signed)
 Safety precautions to be maintained throughout the outpatient stay will include: orient to surroundings, keep bed in low position, maintain call bell within reach at all times, provide assistance with transfer out of bed and ambulation.

## 2023-05-20 ENCOUNTER — Telehealth: Payer: Self-pay

## 2023-05-20 NOTE — Telephone Encounter (Signed)
 Providers portion faxed to [phone] for [name] with attn to Palmer Bobo

## 2023-05-25 ENCOUNTER — Other Ambulatory Visit (INDEPENDENT_AMBULATORY_CARE_PROVIDER_SITE_OTHER): Payer: PPO

## 2023-05-25 DIAGNOSIS — D509 Iron deficiency anemia, unspecified: Secondary | ICD-10-CM

## 2023-05-26 LAB — IBC + FERRITIN
Ferritin: 17.2 ng/mL — ABNORMAL LOW (ref 22.0–322.0)
Iron: 28 ug/dL — ABNORMAL LOW (ref 42–165)
Saturation Ratios: 8 % — ABNORMAL LOW (ref 20.0–50.0)
TIBC: 348.6 ug/dL (ref 250.0–450.0)
Transferrin: 249 mg/dL (ref 212.0–360.0)

## 2023-05-26 LAB — CBC
HCT: 41.3 % (ref 39.0–52.0)
Hemoglobin: 13 g/dL (ref 13.0–17.0)
MCHC: 31.5 g/dL (ref 30.0–36.0)
MCV: 78.5 fL (ref 78.0–100.0)
Platelets: 178 10*3/uL (ref 150.0–400.0)
RBC: 5.27 Mil/uL (ref 4.22–5.81)
RDW: 20 % — ABNORMAL HIGH (ref 11.5–15.5)
WBC: 7.9 10*3/uL (ref 4.0–10.5)

## 2023-05-27 ENCOUNTER — Telehealth: Payer: Self-pay

## 2023-05-27 NOTE — Telephone Encounter (Signed)
Patient says he spoke with Gastroenterology and was informed they did not have a referral for him to get an appointment scheduled. Please send response to clinical pools.

## 2023-06-01 NOTE — Telephone Encounter (Signed)
 Spoke with patient to notify him of recent notes. Patient says he called and was informed that they did not have a referral for the patient. After reviewing patient's chart, it looks like the referral has been placed. Patient says he prefers to be reached on his land line phone and not the [phone].

## 2023-06-03 ENCOUNTER — Ambulatory Visit
Admission: RE | Admit: 2023-06-03 | Discharge: 2023-06-03 | Disposition: A | Discharge status: Home / Self Care | Payer: PPO | Source: Ambulatory Visit | Attending: Student in an Organized Health Care Education/Training Program | Admitting: Student in an Organized Health Care Education/Training Program

## 2023-06-03 ENCOUNTER — Encounter: Payer: Self-pay | Admitting: Student in an Organized Health Care Education/Training Program

## 2023-06-03 ENCOUNTER — Ambulatory Visit
Payer: PPO | Attending: Student in an Organized Health Care Education/Training Program | Admitting: Student in an Organized Health Care Education/Training Program

## 2023-06-03 VITALS — BP 143/92 | HR 69 | Temp 96.9°F | Resp 15 | Ht 71.5 in | Wt 265.0 lb

## 2023-06-03 DIAGNOSIS — E1140 Type 2 diabetes mellitus with diabetic neuropathy, unspecified: Secondary | ICD-10-CM

## 2023-06-03 DIAGNOSIS — E1142 Type 2 diabetes mellitus with diabetic polyneuropathy: Secondary | ICD-10-CM | POA: Insufficient documentation

## 2023-06-03 DIAGNOSIS — M5459 Other low back pain: Secondary | ICD-10-CM | POA: Diagnosis present

## 2023-06-03 DIAGNOSIS — M48062 Spinal stenosis, lumbar region with neurogenic claudication: Secondary | ICD-10-CM

## 2023-06-03 DIAGNOSIS — M5416 Radiculopathy, lumbar region: Secondary | ICD-10-CM | POA: Diagnosis not present

## 2023-06-03 DIAGNOSIS — M47816 Spondylosis without myelopathy or radiculopathy, lumbar region: Secondary | ICD-10-CM

## 2023-06-03 DIAGNOSIS — M4726 Other spondylosis with radiculopathy, lumbar region: Secondary | ICD-10-CM | POA: Diagnosis not present

## 2023-06-03 DIAGNOSIS — G8929 Other chronic pain: Secondary | ICD-10-CM

## 2023-06-03 MED ORDER — DEXAMETHASONE SODIUM PHOSPHATE 10 MG/ML IJ SOLN
INTRAMUSCULAR | Status: AC
  Filled 2023-06-03: qty 1

## 2023-06-03 MED ORDER — CAPSAICIN-CLEANSING GEL 8 % EX KIT
4.0000 | PACK | Freq: Once | CUTANEOUS | Status: AC
  Administered 2023-06-03: 4 via TOPICAL
  Filled 2023-06-03: qty 4

## 2023-06-03 MED ORDER — LIDOCAINE HCL 2 % IJ SOLN
INTRAMUSCULAR | Status: AC
  Filled 2023-06-03: qty 20

## 2023-06-03 MED ORDER — LIDOCAINE HCL 2 % IJ SOLN
20.0000 mL | Freq: Once | INTRAMUSCULAR | Status: AC
  Administered 2023-06-03: 400 mg
  Filled 2023-06-03: qty 20

## 2023-06-03 MED ORDER — IOHEXOL 180 MG/ML  SOLN
10.0000 mL | Freq: Once | INTRAMUSCULAR | Status: AC
Start: 2023-06-03 — End: 2023-06-03
  Administered 2023-06-03: 10 mL via EPIDURAL
  Filled 2023-06-03: qty 20

## 2023-06-03 MED ORDER — IOHEXOL 180 MG/ML  SOLN
INTRAMUSCULAR | Status: AC
  Filled 2023-06-03: qty 20

## 2023-06-03 MED ORDER — DEXAMETHASONE SODIUM PHOSPHATE 10 MG/ML IJ SOLN
10.0000 mg | Freq: Once | INTRAMUSCULAR | Status: AC
  Administered 2023-06-03: 10 mg
  Filled 2023-06-03: qty 1

## 2023-06-03 MED ORDER — ROPIVACAINE HCL 2 MG/ML IJ SOLN
INTRAMUSCULAR | Status: AC
  Filled 2023-06-03: qty 20

## 2023-06-03 MED ORDER — ROPIVACAINE HCL 2 MG/ML IJ SOLN
2.0000 mL | Freq: Once | INTRAMUSCULAR | Status: AC
  Administered 2023-06-03: 2 mL via EPIDURAL
  Filled 2023-06-03: qty 20

## 2023-06-03 MED ORDER — SODIUM CHLORIDE (PF) 0.9 % IJ SOLN
INTRAMUSCULAR | Status: AC
  Filled 2023-06-03: qty 10

## 2023-06-03 MED ORDER — SODIUM CHLORIDE 0.9% FLUSH
2.0000 mL | Freq: Once | INTRAVENOUS | Status: AC
  Administered 2023-06-03: 2 mL

## 2023-06-03 NOTE — Patient Instructions (Addendum)

## 2023-06-03 NOTE — Progress Notes (Signed)
 Safety precautions to be maintained throughout the outpatient stay will include: orient to surroundings, keep bed in low position, maintain call bell within reach at all times, provide assistance with transfer out of bed and ambulation.

## 2023-06-03 NOTE — Progress Notes (Signed)
 PROVIDER NOTE: Interpretation of information contained herein should be left to medically-trained personnel. Specific patient instructions are provided elsewhere under "Patient Instructions" section of medical record. This document was created in part using STT-dictation technology, any transcriptional errors that may result from this process are unintentional.  Patient: Douglas [name] Type: Established DOB: October 22, 1945 MRN: [id number] PCP: Glori Luis, MD  Service: Procedure DOS: 06/03/2023 Setting: Ambulatory Location: Ambulatory outpatient facility Delivery: Face-to-face Provider: Edward Jolly, MD Specialty: Interventional Pain Management Specialty designation: 09 Location: Outpatient facility Ref. Prov.: Glori Luis, MD       Interventional Therapy   Type: Lumbar epidural steroid injection (LESI) (interlaminar) #1    Laterality: Left   Level:  L5-S1 Level.  Imaging: Fluoroscopic guidance         Anesthesia: Local anesthesia (1-2% Lidocaine) DOS: 06/03/2023  Performed by: Edward Jolly, MD  Purpose: Diagnostic/Therapeutic Indications: Lumbar radicular pain of intraspinal etiology of more than 4 weeks that has failed to respond to conservative therapy and is severe enough to impact quality of life or function. 1. Spinal stenosis, lumbar region, with neurogenic claudication   2. Chronic radicular lumbar pain   3. Lumbar facet arthropathy   4. Chronic painful diabetic neuropathy (HCC)    NAS-11 Pain score:   Pre-procedure: 6 /10   Post-procedure: 0-No pain/10      Position / Prep / Materials:  Position: Prone w/ head of the table raised (slight reverse trendelenburg) to facilitate breathing.  Prep solution: ChloraPrep (2% chlorhexidine gluconate and 70% isopropyl alcohol) Prep Area: Entire Posterior Lumbar Region from lower scapular tip down to mid buttocks area and from flank to flank. Materials:  Tray: Epidural tray Needle(s):  Type: Epidural needle  (Tuohy) Gauge (G):  22 Length: Regular (3.5-in) Qty: 1   H&P (Pre-op Assessment):  [name] is a 78 y.o. (year old), male patient, seen today for interventional treatment. He  has a past surgical history that includes Knee surgery (1993); Back surgery (1977); Shoulder surgery (Bilateral, 1998); Anal fissure repair; Finger amputation; Cataract extraction w/PHACO (Right, 07/16/2016); Cataract extraction w/PHACO (Left, 08/13/2016); and Cardiac catheterization (1998). [name] has a current medication list which includes the following prescription(s): acetaminophen, allopurinol, aspirin, b complex vitamins, calcitriol, carvedilol, cholecalciferol, clotrimazole-betamethasone, clove oil, cyclobenzaprine, dapagliflozin propanediol, doxazosin, duloxetine, esomeprazole, finasteride, furosemide, tresiba flextouch, novofine pen needle, levothyroxine, losartan, magnesium, mometasone, multiple vitamins-minerals, hypodermic needle 18gx1", hypodermic needle 25gx1", pregabalin, ozempic (1 mg/dose), ferrous sulfate, gabapentin, gentamicin cream, hydrocodone-acetaminophen, loratadine, meloxicam, simvastatin, sulfamethoxazole-trimethoprim, tadalafil, and testosterone cypionate, and the following Facility-Administered Medications: dexamethasone, iohexol, lidocaine, ropivacaine (pf) 2 mg/ml (0.2%), and sodium chloride flush. His primarily concern today is the Back Pain (lower) and Peripheral Neuropathy  Initial Vital Signs:  Pulse/HCG Rate: 81  Temp: (!) 96.9 F (36.1 C) Resp: 16 BP: (!) 140/87 SpO2: 97 %  BMI: Estimated body mass index is 36.44 kg/m as calculated from the following:   Height as of this encounter: 5' 11.5" (1.816 m).   Weight as of this encounter: 265 lb (120.2 kg).  Risk Assessment: Allergies: Reviewed. He is allergic to bee venom, dye fdc red [red dye #40 (allura red)], omeprazole, atenolol, benadryl [diphenhydramine hcl (sleep)], enalapril, gabapentin, and hctz [hydrochlorothiazide].   Allergy Precautions: None required Coagulopathies: Reviewed. None identified.  Blood-thinner therapy: None at this time Active Infection(s): Reviewed. None identified. [name] is afebrile  Site Confirmation: [name] was asked to confirm the procedure and laterality before marking the site Procedure checklist: Completed Consent: Before the procedure and  under the influence of no sedative(s), amnesic(s), or anxiolytics, the patient was informed of the treatment options, risks and possible complications. To fulfill our ethical and legal obligations, as recommended by the American Medical Association's Code of Ethics, I have informed the patient of my clinical impression; the nature and purpose of the treatment or procedure; the risks, benefits, and possible complications of the intervention; the alternatives, including doing nothing; the risk(s) and benefit(s) of the alternative treatment(s) or procedure(s); and the risk(s) and benefit(s) of doing nothing. The patient was provided information about the general risks and possible complications associated with the procedure. These may include, but are not limited to: failure to achieve desired goals, infection, bleeding, organ or nerve damage, allergic reactions, paralysis, and death. In addition, the patient was informed of those risks and complications associated to Spine-related procedures, such as failure to decrease pain; infection (i.e.: Meningitis, epidural or intraspinal abscess); bleeding (i.e.: epidural hematoma, subarachnoid hemorrhage, or any other type of intraspinal or peri-dural bleeding); organ or nerve damage (i.e.: Any type of peripheral nerve, nerve root, or spinal cord injury) with subsequent damage to sensory, motor, and/or autonomic systems, resulting in permanent pain, numbness, and/or weakness of one or several areas of the body; allergic reactions; (i.e.: anaphylactic reaction); and/or death. Furthermore, the patient was  informed of those risks and complications associated with the medications. These include, but are not limited to: allergic reactions (i.e.: anaphylactic or anaphylactoid reaction(s)); adrenal axis suppression; blood sugar elevation that in diabetics may result in ketoacidosis or comma; water retention that in patients with history of congestive heart failure may result in shortness of breath, pulmonary edema, and decompensation with resultant heart failure; weight gain; swelling or edema; medication-induced neural toxicity; particulate matter embolism and blood vessel occlusion with resultant organ, and/or nervous system infarction; and/or aseptic necrosis of one or more joints. Finally, the patient was informed that Medicine is not an exact science; therefore, there is also the possibility of unforeseen or unpredictable risks and/or possible complications that may result in a catastrophic outcome. The patient indicated having understood very clearly. We have given the patient no guarantees and we have made no promises. Enough time was given to the patient to ask questions, all of which were answered to the patient's satisfaction. Mr. Burbano has indicated that he wanted to continue with the procedure. Attestation: I, the ordering provider, attest that I have discussed with the patient the benefits, risks, side-effects, alternatives, likelihood of achieving goals, and potential problems during recovery for the procedure that I have provided informed consent. Date  Time: 06/03/2023  9:47 AM   Pre-Procedure Preparation:  Monitoring: As per clinic protocol. Respiration, ETCO2, SpO2, BP, heart rate and rhythm monitor placed and checked for adequate function Safety Precautions: Patient was assessed for positional comfort and pressure points before starting the procedure. Time-out: I initiated and conducted the "Time-out" before starting the procedure, as per protocol. The patient was asked to participate by  confirming the accuracy of the "Time Out" information. Verification of the correct person, site, and procedure were performed and confirmed by me, the nursing staff, and the patient. "Time-out" conducted as per Joint Commission's Universal Protocol (UP.01.01.01). Time: 1120 Start Time: 1120 hrs.  Description/Narrative of Procedure:          Target: Epidural space via interlaminar opening, initially targeting the lower laminar border of the superior vertebral body. Region: Lumbar Approach: Percutaneous paravertebral  Rationale (medical necessity): procedure needed and proper for the diagnosis and/or treatment of the patient's  medical symptoms and needs. Procedural Technique Safety Precautions: Aspiration looking for blood return was conducted prior to all injections. At no point did we inject any substances, as a needle was being advanced. No attempts were made at seeking any paresthesias. Safe injection practices and needle disposal techniques used. Medications properly checked for expiration dates. SDV (single dose vial) medications used. Description of the Procedure: Protocol guidelines were followed. The procedure needle was introduced through the skin, ipsilateral to the reported pain, and advanced to the target area. Bone was contacted and the needle walked caudad, until the lamina was cleared. The epidural space was identified using "loss-of-resistance technique" with 2-3 ml of PF-NaCl (0.9% NSS), in a 5cc LOR glass syringe.  5 cc solution made of 2 cc of preservative-free saline, 2 cc of 0.2% ropivacaine, 1 cc of Decadron 10 mg/cc.   Vitals:   06/03/23 1112 06/03/23 1122 06/03/23 1125 06/03/23 1135  BP: (!) 179/119 (!) 172/101 (!) 168/99 (!) 143/92  Pulse: 73 67 66 69  Resp: 15 15 16 15   Temp:      SpO2: 100% 98% 99% 99%  Weight:      Height:        Start Time: 1120 hrs. End Time: 1125 hrs.  Imaging Guidance (Spinal):          Type of Imaging Technique: Fluoroscopy Guidance  (Spinal) Indication(s): Fluoroscopy guidance for needle placement to enhance accuracy in procedures requiring precise needle localization for targeted delivery of medication in or near specific anatomical locations not easily accessible without such real-time imaging assistance. Exposure Time: Please see nurses notes. Contrast: Before injecting any contrast, we confirmed that the patient did not have an allergy to iodine, shellfish, or radiological contrast. Once satisfactory needle placement was completed at the desired level, radiological contrast was injected. Contrast injected under live fluoroscopy. No contrast complications. See chart for type and volume of contrast used. Fluoroscopic Guidance: I was personally present during the use of fluoroscopy. "Tunnel Vision Technique" used to obtain the best possible view of the target area. Parallax error corrected before commencing the procedure. "Direction-depth-direction" technique used to introduce the needle under continuous pulsed fluoroscopy. Once target was reached, antero-posterior, oblique, and lateral fluoroscopic projection used confirm needle placement in all planes. Images permanently stored in EMR. Interpretation: I personally interpreted the imaging intraoperatively. Adequate needle placement confirmed in multiple planes. Appropriate spread of contrast into desired area was observed. No evidence of afferent or efferent intravascular uptake. No intrathecal or subarachnoid spread observed. Permanent images saved into the patient's record.  Antibiotic Prophylaxis:   Anti-infectives (From admission, onward)    None      Indication(s): None identified  Post-operative Assessment:  Post-procedure Vital Signs:  Pulse/HCG Rate: 69  Temp: (!) 96.9 F (36.1 C) Resp: 15 BP: (!) 143/92 SpO2: 99 %  EBL: None  Complications: No immediate post-treatment complications observed by team, or reported by patient.  Note: The patient tolerated  the entire procedure well. A repeat set of vitals were taken after the procedure and the patient was kept under observation following institutional policy, for this type of procedure. Post-procedural neurological assessment was performed, showing return to baseline, prior to discharge. The patient was provided with post-procedure discharge instructions, including a section on how to identify potential problems. Should any problems arise concerning this procedure, the patient was given instructions to immediately contact us, at any time, without hesitation. In any case, we plan to contact the patient by telephone for a follow-up status report  regarding this interventional procedure.  Comments:  No additional relevant information.  Plan of Care (POC)  Orders:  Orders Placed This Encounter  Procedures   DG PAIN CLIN[facility] C-ARM 1-60 MIN NO REPORT    Intraoperative interpretation by procedural physician at [facility].    Standing Status:   Standing    Number of Occurrences:   1    Reason for exam::   Assistance in needle guidance and placement for procedures requiring needle placement in or near specific anatomical locations not easily accessible without such assistance.    Medications ordered for procedure: Meds ordered this encounter  Medications   capsaicin topical system 8 % patch 4 patch   iohexol (OMNIPAQUE) 180 MG/ML injection 10 mL    Must be Myelogram-compatible. If not available, you may substitute with a water-soluble, non-ionic, hypoallergenic, myelogram-compatible radiological contrast medium.   lidocaine (XYLOCAINE) 2 % (with pres) injection 400 mg   sodium chloride flush (NS) 0.9 % injection 2 mL   ropivacaine (PF) 2 mg/mL (0.2%) (NAROPIN) injection 2 mL   dexamethasone (DECADRON) injection 10 mg   Medications administered: We administered capsaicin topical system.  See the medical record for exact dosing, route, and time of administration.  Follow-up plan:   Return  in about 4 weeks (around 07/01/2023) for PPE, F2F.       Left L5/S1 ESI, [place] 06/03/23    Recent Visits Date Type Provider Dept  05/19/23 Office Visit Edward Jolly, MD [facility]  Showing recent visits within past 90 days and meeting all other requirements Today's Visits Date Type Provider Dept  06/03/23 Procedure visit Edward Jolly, MD [facility]  Showing today's visits and meeting all other requirements Future Appointments Date Type Provider Dept  07/01/23 Appointment Edward Jolly, MD [facility]  Showing future appointments within next 90 days and meeting all other requirements  Disposition: Discharge home  Discharge (Date  Time): 06/03/2023; 1140 hrs.   Primary Care Physician: Glori Luis, MD Location: [facility] Note by: Edward Jolly, MD (TTS technology used. I apologize for any typographical errors that were not detected and corrected.) Date: 06/03/2023; Time: 11:59 AM  Disclaimer:  Medicine is not an Visual merchandiser. The only guarantee in medicine is that nothing is guaranteed. It is important to note that the decision to proceed with this intervention was based on the information collected from the patient. The Data and conclusions were drawn from the patient's questionnaire, the interview, and the physical examination. Because the information was provided in large part by the patient, it cannot be guaranteed that it has not been purposely or unconsciously manipulated. Every effort has been made to obtain as much relevant data as possible for this evaluation. It is important to note that the conclusions that lead to this procedure are derived in large part from the available data. Always take into account that the treatment will also be dependent on availability of resources and existing treatment guidelines, considered by other Pain Management Practitioners as being common knowledge and practice, at the time of  the intervention. For Medico-Legal purposes, it is also important to point out that variation in procedural techniques and pharmacological choices are the acceptable norm. The indications, contraindications, technique, and results of the above procedure should only be interpreted and judged by a Board-Certified Interventional Pain Specialist with extensive familiarity and expertise in the same exact procedure and technique.

## 2023-06-03 NOTE — Progress Notes (Signed)
 PROVIDER NOTE: Interpretation of information contained herein should be left to medically-trained personnel. Specific patient instructions are provided elsewhere under "Patient Instructions" section of medical record. This document was created in part using STT-dictation technology, any transcriptional errors that may result from this process are unintentional.  Patient: Douglas [name] Type: Established DOB: [birth year] MRN: [id number] PCP: Glori Luis, MD  Service: Procedure DOS: 06/03/2023 Setting: Ambulatory Location: Ambulatory outpatient facility Delivery: Face-to-face Provider: Edward Jolly, MD Specialty: Interventional Pain Management Specialty designation: 09 Location: Outpatient facility Ref. Prov.: Glori Luis, MD       Interventional Therapy   Type: Qutenza Neurolysis #1  Laterality:  Bilateral Area treated: Feet Imaging Guidance: None Anesthesia/analgesia/anxiolysis/sedation: None required Medication (Right): Qutenza (capsaicin 8%) topical system Medication (Left): Qutenza (capsaicin 8%) topical system Date: 06/03/2023 Performed by: Edward Jolly, MD Rationale (medical necessity): procedure needed and proper for the treatment of Mr. Enberg medical symptoms and needs. Indication: Painful diabetic peripheral neuralgia (DPN) (ICD-10-CM:E11.40) severe enough to impact quality of life or function.  NAS-11 Pain score:   Pre-procedure: 6 /10   Post-procedure: 6 /10     Position / Prep / Materials:  Position: Supine  Materials: Qutenza Kit (4 patches)  H&P (Pre-op Assessment):  [name] is a 78 y.o. (year old), male patient, seen today for interventional treatment. He  has a past surgical history that includes Knee surgery (1993); Back surgery (1977); Shoulder surgery (Bilateral, 1998); Anal fissure repair; Finger amputation; Cataract extraction w/PHACO (Right, 07/16/2016); Cataract extraction w/PHACO (Left, 08/13/2016); and Cardiac catheterization  (1998). Mr. Byrom has a current medication list which includes the following prescription(s): acetaminophen, allopurinol, aspirin, b complex vitamins, calcitriol, carvedilol, cholecalciferol, clotrimazole-betamethasone, clove oil, cyclobenzaprine, dapagliflozin propanediol, doxazosin, duloxetine, esomeprazole, finasteride, furosemide, tresiba flextouch, novofine pen needle, levothyroxine, losartan, magnesium, mometasone, multiple vitamins-minerals, hypodermic needle 18gx1", hypodermic needle 25gx1", pregabalin, ozempic (1 mg/dose), ferrous sulfate, gabapentin, gentamicin cream, hydrocodone-acetaminophen, loratadine, meloxicam, simvastatin, sulfamethoxazole-trimethoprim, tadalafil, and testosterone cypionate, and the following Facility-Administered Medications: capsaicin topical system, dexamethasone, iohexol, lidocaine, ropivacaine (pf) 2 mg/ml (0.2%), and sodium chloride flush. His primarily concern today is the Back Pain (lower) and Peripheral Neuropathy  Initial Vital Signs:  Pulse/HCG Rate: 81  Temp: (!) 96.9 F (36.1 C) Resp: 16 BP: (!) 140/87 SpO2: 97 %  BMI: Estimated body mass index is 36.44 kg/m as calculated from the following:   Height as of this encounter: 5' 11.5" (1.816 m).   Weight as of this encounter: 265 lb (120.2 kg).  Risk Assessment: Allergies: Reviewed. He is allergic to bee venom, dye fdc red [red dye #40 (allura red)], omeprazole, atenolol, benadryl [diphenhydramine hcl (sleep)], enalapril, gabapentin, and hctz [hydrochlorothiazide].  Allergy Precautions: None required Coagulopathies: Reviewed. None identified.  Blood-thinner therapy: None at this time Active Infection(s): Reviewed. None identified. Mr. Morgan is afebrile  Site Confirmation: Mr. Handley was asked to confirm the procedure and laterality before marking the site Procedure checklist: Completed Consent: Before the procedure and under the influence of no sedative(s), amnesic(s), or anxiolytics, the  patient was informed of the treatment options, risks and possible complications. To fulfill our ethical and legal obligations, as recommended by the American Medical Association's Code of Ethics, I have informed the patient of my clinical impression; the nature and purpose of the treatment or procedure; the risks, benefits, and possible complications of the intervention; the alternatives, including doing nothing; the risk(s) and benefit(s) of the alternative treatment(s) or procedure(s); and the risk(s) and benefit(s) of doing nothing. The patient was provided information about  the general risks and possible complications associated with the procedure. These may include, but are not limited to: failure to achieve desired goals, infection, bleeding, organ or nerve damage, allergic reactions, paralysis, and death. In addition, the patient was informed of those risks and complications associated to the procedure, such as failure to decrease pain; infection; bleeding; organ or nerve damage with subsequent damage to sensory, motor, and/or autonomic systems, resulting in permanent pain, numbness, and/or weakness of one or several areas of the body; allergic reactions; (i.e.: anaphylactic reaction); and/or death. Furthermore, the patient was informed of those risks and complications associated with the medications. These include, but are not limited to: allergic reactions (i.e.: anaphylactic or anaphylactoid reaction(s)); adrenal axis suppression; blood sugar elevation that in diabetics may result in ketoacidosis or comma; water retention that in patients with history of congestive heart failure may result in shortness of breath, pulmonary edema, and decompensation with resultant heart failure; weight gain; swelling or edema; medication-induced neural toxicity; particulate matter embolism and blood vessel occlusion with resultant organ, and/or nervous system infarction; and/or aseptic necrosis of one or more  joints. Finally, the patient was informed that Medicine is not an exact science; therefore, there is also the possibility of unforeseen or unpredictable risks and/or possible complications that may result in a catastrophic outcome. The patient indicated having understood very clearly. We have given the patient no guarantees and we have made no promises. Enough time was given to the patient to ask questions, all of which were answered to the patient's satisfaction. Mr. Jentz has indicated that he wanted to continue with the procedure. Attestation: I, the ordering provider, attest that I have discussed with the patient the benefits, risks, side-effects, alternatives, likelihood of achieving goals, and potential problems during recovery for the procedure that I have provided informed consent. Date  Time: 06/03/2023  9:47 AM  Pre-Procedure Preparation:  Monitoring: As per clinic protocol. Respiration, ETCO2, SpO2, BP, heart rate and rhythm monitor placed and checked for adequate function Safety Precautions: Patient was assessed for positional comfort and pressure points before starting the procedure. Time-out: I initiated and conducted the "Time-out" before starting the procedure, as per protocol. The patient was asked to participate by confirming the accuracy of the "Time Out" information. Verification of the correct person, site, and procedure were performed and confirmed by me, the nursing staff, and the patient. "Time-out" conducted as per Joint Commission's Universal Protocol (UP.01.01.01). Time: 1010 Start Time: 1013 hrs.  Description/Narrative of Procedure:          Region: Distal lower extremities Target Area: Sensory peripheral nerves affected by diabetic peripheral neuropathy Site: Feet Approach: Percutaneous  No./Series: Not applicable  Type: Percutaneous  Purpose: Therapeutic  Region: Distal lower extremities  Start Time: 1013 hrs.  Description of the Procedure: Protocol  guidelines were followed. The patient was assisted into a comfortable position.  Informed consent was obtained in the patient monitored in the usual manner.  All questions were answered prior to the procedure.  They Qutenza patches were applied to the affected area and then covered with the wrap.  The Patient was kept under observation until the treatment was completed.  The patches were removed and the treated area was inspected.  Vitals:   06/03/23 0959  BP: (!) 140/87  Pulse: 81  Resp: 16  Temp: (!) 96.9 F (36.1 C)  SpO2: 97%  Weight: 265 lb (120.2 kg)  Height: 5' 11.5" (1.816 m)     End Time:   hrs.  Post-procedure  Vital Signs:  Pulse/HCG Rate: 81  Temp: (!) 96.9 F (36.1 C) Resp: 16 BP: (!) 140/87 SpO2: 97 %  EBL: None  Complications: No immediate post-treatment complications observed by team, or reported by patient.  Note: The patient tolerated the entire procedure well. A repeat set of vitals were taken after the procedure and the patient was kept under observation following institutional policy, for this type of procedure. Post-procedural neurological assessment was performed, showing return to baseline, prior to discharge. The patient was provided with post-procedure discharge instructions, including a section on how to identify potential problems. Should any problems arise concerning this procedure, the patient was given instructions to immediately contact us, at any time, without hesitation. In any case, we plan to contact the patient by telephone for a follow-up status report regarding this interventional procedure.  Comments:  No additional relevant information.  Plan of Care (POC)  Orders:  Orders Placed This Encounter  Procedures   DG PAIN CLINIC C-ARM 1-60 MIN NO REPORT    Intraoperative interpretation by procedural physician at [facility].    Standing Status:   Standing    Number of Occurrences:   1    Reason for exam::   Assistance in needle  guidance and placement for procedures requiring needle placement in or near specific anatomical locations not easily accessible without such assistance.     Medications ordered for procedure: Meds ordered this encounter  Medications   capsaicin topical system 8 % patch 4 patch   iohexol (OMNIPAQUE) 180 MG/ML injection 10 mL    Must be Myelogram-compatible. If not available, you may substitute with a water-soluble, non-ionic, hypoallergenic, myelogram-compatible radiological contrast medium.   lidocaine (XYLOCAINE) 2 % (with pres) injection 400 mg   sodium chloride flush (NS) 0.9 % injection 2 mL   ropivacaine (PF) 2 mg/mL (0.2%) (NAROPIN) injection 2 mL   dexamethasone (DECADRON) injection 10 mg   Medications administered: Damiano L. Tavano had no medications administered during this visit.  See the medical record for exact dosing, route, and time of administration.  Follow-up plan:   Return in about 4 weeks (around 07/01/2023) for PPE, F2F.       Recent Visits Date Type Provider Dept  05/19/23 Office Visit Edward Jolly, MD [facility]  Showing recent visits within past 90 days and meeting all other requirements Today's Visits Date Type Provider Dept  06/03/23 Procedure visit Edward Jolly, MD [facility]  Showing today's visits and meeting all other requirements Future Appointments No visits were found meeting these conditions. Showing future appointments within next 90 days and meeting all other requirements  Disposition: Discharge home  Discharge (Date  Time): 06/03/2023;   hrs.   Primary Care Physician: Glori Luis, MD Location: [facility] Note by: Edward Jolly, MD (TTS technology used. I apologize for any typographical errors that were not detected and corrected.) Date: 06/03/2023; Time: 10:32 AM  Disclaimer:  Medicine is not an Visual merchandiser. The only guarantee in medicine is that nothing is guaranteed. It  is important to note that the decision to proceed with this intervention was based on the information collected from the patient. The Data and conclusions were drawn from the patient's questionnaire, the interview, and the physical examination. Because the information was provided in large part by the patient, it cannot be guaranteed that it has not been purposely or unconsciously manipulated. Every effort has been made to obtain as much relevant data as possible for this  evaluation. It is important to note that the conclusions that lead to this procedure are derived in large part from the available data. Always take into account that the treatment will also be dependent on availability of resources and existing treatment guidelines, considered by other Pain Management Practitioners as being common knowledge and practice, at the time of the intervention. For Medico-Legal purposes, it is also important to point out that variation in procedural techniques and pharmacological choices are the acceptable norm. The indications, contraindications, technique, and results of the above procedure should only be interpreted and judged by a Board-Certified Interventional Pain Specialist with extensive familiarity and expertise in the same exact procedure and technique.

## 2023-06-04 ENCOUNTER — Telehealth: Payer: Self-pay

## 2023-06-04 NOTE — Telephone Encounter (Signed)
 PAP: Application for Douglas [name] has been submitted to [employer] ([name]), via fax

## 2023-06-04 NOTE — Telephone Encounter (Signed)
 Call to patient and he reports no issues post-procedure.

## 2023-06-05 ENCOUNTER — Encounter: Payer: Self-pay | Admitting: Family Medicine

## 2023-06-09 NOTE — Progress Notes (Signed)
 Pharmacy Medication Assistance Program Note    06/09/2023  Patient ID: Douglas [name], male  DOB: Jan 03, 1946, 78 y.o.  MRN:  [id number]     06/09/2023  Outreach Medication Two  Manufacturer Medication Two [employer]  [employer] Drugs [name]  Dose of [name] 10 mg  Type of Radiographer, therapeutic Assistance  Date Application Sent to Patient 05/29/2023  Application Items Requested Application;Proof of Income  Name of Prescriber [name]  Application Items Received From Patient Application;Proof of Income  Method Application Sent to Manufacturer Fax  Date Application Submitted to Manufacturer 06/04/2023  Patient Assistance Determination Approved  Approval Start Date 06/05/2023  Patient Notification Method Telephone Call  Telephone Call Outcome Successful

## 2023-06-09 NOTE — Telephone Encounter (Signed)
 PAP: Patient assistance application for Marcelline Deist has been approved by PAP Companies: [name] from 06/05/2023 to 04/13/2024. Medication should be delivered to PAP Delivery: Home. For further shipping updates, please contact [employer] ([name]) at [phone]. Patient ID is: [id number]_

## 2023-06-11 ENCOUNTER — Encounter: Payer: Self-pay | Admitting: Gastroenterology

## 2023-06-12 NOTE — Telephone Encounter (Signed)
 Noted.

## 2023-06-15 ENCOUNTER — Ambulatory Visit: Payer: PPO

## 2023-06-15 DIAGNOSIS — E538 Deficiency of other specified B group vitamins: Secondary | ICD-10-CM | POA: Diagnosis not present

## 2023-06-15 MED ORDER — CYANOCOBALAMIN 1000 MCG/ML IJ SOLN
1000.0000 ug | Freq: Once | INTRAMUSCULAR | Status: AC
  Administered 2023-06-15: 1000 ug via INTRAMUSCULAR

## 2023-06-15 NOTE — Progress Notes (Signed)
 Pt presented for their vitamin B12 injection. Pt was identified through two identifiers. Pt tolerated shot well in their right deltoid.

## 2023-06-16 ENCOUNTER — Encounter: Payer: Self-pay | Admitting: Podiatry

## 2023-06-16 ENCOUNTER — Ambulatory Visit: Payer: PPO | Admitting: Podiatry

## 2023-06-16 VITALS — Ht 71.5 in | Wt 265.0 lb

## 2023-06-16 DIAGNOSIS — L600 Ingrowing nail: Secondary | ICD-10-CM

## 2023-06-16 NOTE — Progress Notes (Signed)
 Chief Complaint  Patient presents with   Nail Problem    Pt is here to have bilateral great toenails removed permanently.    HPI: 78 y.o. male presenting today for follow-up evaluation of symptomatic toenails to the bilateral great toes.  He says that his toenails are very painful and tender despite conservative routine debridements.  He would like to have the nails removed completely.  Past Medical History:  Diagnosis Date   Arthritis    Depression    Diabetes mellitus with complication (HCC)    Diabetic neuropathy (HCC)    Diastolic dysfunction    a. TTE 07/2017: EF 60-65%, mild concentric LVH, no RWMA, Gr1DD, trivial AI, mildly dilated LA, RVSF normal, PASP normal   Edema    feet/legs   GERD (gastroesophageal reflux disease)    Gout    History of stress test    a. MV 06/2017: small in size, mild in severity, apical anterior and apical defect that was minimally reversible and most likely represented artifact and less likely ischemia/scar, LVEF 55-65%, low risk, probably normal stress test   Hypertension    Hypothyroidism    Kidney cysts    renal failure 2013   Kidney stones    Dr. Artis Flock   OSA (obstructive sleep apnea)    supplemental oxygen at night   Oxygen dependent    hs   [facility] spotted fever 12/10/2017   Positve IgG in titer on 11/20/17   S/P cardiac cath 1998   a. no obstructive disease   Syncope and collapse    Temporary low platelet count (HCC)     Past Surgical History:  Procedure Laterality Date   ANAL FISSURE REPAIR     BACK SURGERY  1977   rupture disc lumbar spine   CARDIAC CATHETERIZATION  1998   [facility] with [facility].    CATARACT EXTRACTION W/PHACO Right 07/16/2016   Procedure: CATARACT EXTRACTION PHACO AND INTRAOCULAR LENS PLACEMENT (IOC);  Surgeon: Sallee Lange, MD;  Location: [facility] ORS;  Service: Ophthalmology;  Laterality: Right;  [place] 01:11 AP% 17.6 CDE 24.83 fluid pack lot # [id number]   CATARACT EXTRACTION  W/PHACO Left 08/13/2016   Procedure: CATARACT EXTRACTION PHACO AND INTRAOCULAR LENS PLACEMENT (IOC) suture placed in left eye at end of procedure;  Surgeon: Sallee Lange, MD;  Location: [facility] OR[facility];  Service: Ophthalmology;  Laterality: Left;  [place] 01:55 AP% 22.7 CDE 53.56 fluid pack lot # [id number]   FINGER AMPUTATION     partial   KNEE SURGERY  1993   arthroscopy   SHOULDER SURGERY Bilateral 1998   arthroscopic right, rotator cuff repair left    Allergies  Allergen Reactions   Bee Venom Swelling   Dye Fdc Red [Red Dye #40 (Allura Red)] Swelling and Other (See Comments)    Reaction: gout   Omeprazole Other (See Comments)    Other reaction(s): increased sx on high dose   Atenolol Other (See Comments)    Did not regulate blood pressure   Benadryl [Diphenhydramine Hcl (Sleep)] Other (See Comments)    Hyperactivity    Enalapril Itching    itching   Gabapentin Palpitations and Rash    rash   Hctz [Hydrochlorothiazide] Other (See Comments)    Decreased potassium     Physical Exam: General: The patient is alert and oriented x3 in no acute distress.  Dermatology: Hyperkeratotic dystrophic nails noted to the bilateral great toes with associated tenderness with palpation  Vascular: Palpable pedal pulses bilaterally. Capillary  refill within normal limits.  No appreciable edema.  No erythema.  Neurological: Grossly intact via light touch  Musculoskeletal Exam: No pedal deformities noted.  Assessment/Plan of Care: 1.  Dystrophic symptomatic nails bilateral great toes  -Patient evaluated -Patient continues to have pain and tenderness associated to the bilateral great toenails despite routine debridements.  He has a history of ingrown toenails and he has had procedures done on them before.  He says that he would like to completely remove the toenails permanently.  The nail avulsion procedure was explained in detail to the patient.  No guarantees were expressed or implied.  He consents  and would like to proceed -The toes were prepped in aseptic manner and digital block performed using 3 mL of 2% lidocaine plain.  The nails were avulsed in their entirety followed by 3 x 30-second application of phenol and alcohol flush.  Dressings applied -Post care instructions provided -Return to clinic 3 weeks    Felecia Shelling, DPM [facility]  Dr. Felecia Shelling, DPM    [address] [place], [place] [address]                Office [phone]  Fax [phone]

## 2023-06-17 ENCOUNTER — Telehealth: Payer: Self-pay | Admitting: Podiatry

## 2023-06-17 NOTE — Telephone Encounter (Signed)
 Patient called and wanted to make sure his prescription was sent in to the [facility] pharmacy in [place] on [address] Thank you.

## 2023-06-23 NOTE — Progress Notes (Unsigned)
 [facility] [address] [place] [address] Phone: [phone] Subjective:   Douglas [name], am serving as a scribe for Dr. Antoine Primas.  I'm seeing this patient by the request  of:  Glori Luis, MD (Inactive)  CC: bilateral knee pain   [id number]  05/13/2023 Has been quite sometime since he has had injections.  Has had severe amount of arthritic changes for some time.  Will repeat x-rays to further evaluate with last ones being in 2020.  Discussed with patient about the importance of weight loss.  Patient is a diabetic with peripheral neuropathy and we will monitor blood sugars a little closer.  Do think that patient could be a good candidate for viscosupplementation as well.  Patient wants to avoid any surgical intervention if possible.  Encouraged him to get BMI at least under 35.  Follow-up again in 6 to 8 weeks      Update 06/24/2023 Douglas [name] is a 78 y.o. male coming in with complaint of B knee pain. Patient states that cortisone injections were helpful.   History of L shoulder surgery. Had knot appear in muscle after COVID injection. Full ROM but unable to pick a gallon of milk for past 6 months.   X-rays taken at last exam showed severe medial near bone-on-bone osteoarthritic changes.  Since we have seen patient as well did receive a steroid injection in the lumbar spine.   Most recent labs also show severe iron deficiency.  Past Medical History:  Diagnosis Date   Arthritis    Depression    Diabetes mellitus with complication (HCC)    Diabetic neuropathy (HCC)    Diastolic dysfunction    a. TTE 07/2017: EF 60-65%, mild concentric LVH, no RWMA, Gr1DD, trivial AI, mildly dilated LA, RVSF normal, PASP normal   Edema    feet/legs   GERD (gastroesophageal reflux disease)    Gout    History of stress test    a. MV 06/2017: small in size, mild in severity, apical anterior and apical defect that was minimally  reversible and most likely represented artifact and less likely ischemia/scar, LVEF 55-65%, low risk, probably normal stress test   Hypertension    Hypothyroidism    Kidney cysts    renal failure 2013   Kidney stones    Dr. Artis Flock   OSA (obstructive sleep apnea)    supplemental oxygen at night   Oxygen dependent    hs   [facility] spotted fever 12/10/2017   Positve IgG in titer on 11/20/17   S/P cardiac cath 1998   a. no obstructive disease   Syncope and collapse    Temporary low platelet count (HCC)    Past Surgical History:  Procedure Laterality Date   ANAL FISSURE REPAIR     BACK SURGERY  1977   rupture disc lumbar spine   CARDIAC CATHETERIZATION  1998   [facility] [place] with [facility].    CATARACT EXTRACTION W/PHACO Right 07/16/2016   Procedure: CATARACT EXTRACTION PHACO AND INTRAOCULAR LENS PLACEMENT (IOC);  Surgeon: Sallee Lange, MD;  Location: [facility] ORS;  Service: Ophthalmology;  Laterality: Right;  [place] 01:11 AP% 17.6 CDE 24.83 fluid pack lot # [id number]   CATARACT EXTRACTION W/PHACO Left 08/13/2016   Procedure: CATARACT EXTRACTION PHACO AND INTRAOCULAR LENS PLACEMENT (IOC) suture placed in left eye at end of procedure;  Surgeon: Sallee Lange, MD;  Location: [facility] ORS;  Service: Ophthalmology;  Laterality: Left;  [place] 01:55  AP% 22.7 CDE 53.56 fluid pack lot # [id number]   FINGER AMPUTATION     partial   KNEE SURGERY  1993   arthroscopy   SHOULDER SURGERY Bilateral 1998   arthroscopic right, rotator cuff repair left   Social History   Socioeconomic History   Marital status: Married    Spouse name: [place]   Number of children: 3   Years of education: 13   Highest education level: Not on file  Occupational History    Comment: retired  Tobacco Use   Smoking status: Never   Smokeless tobacco: Never  Vaping Use   Vaping status: Never Used  Substance and Sexual Activity   Alcohol use: No   Drug use: No   Sexual activity: Yes  Other  Topics Concern   Not on file  Social History Narrative   Lives in [place] with his wife ([place]). No children. Three step children. No pets.      Work - Patient is retired. Maintenance supervisor.      School - One year college education.      Right handed.      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               Social Drivers of Health   Financial Resource Strain: Low Risk  (08/19/2022)   Overall Financial Resource Strain (CARDIA)    Difficulty of Paying Living Expenses: Not hard at all  Food Insecurity: No Food Insecurity (08/19/2022)   Hunger Vital Sign    Worried About Running Out of Food in the Last Year: Never true    Ran Out of Food in the Last Year: Never true  Transportation Needs: No Transportation Needs (08/19/2022)   PRAPARE - Administrator, Civil Service (Medical): No    Lack of Transportation (Non-Medical): No  Physical Activity: Insufficiently Active (08/19/2022)   Exercise Vital Sign    Days of Exercise per Week: 7 days    Minutes of Exercise per Session: 20 min  Stress: No Stress Concern Present (08/19/2022)   [employer] of Occupational Health - Occupational Stress Questionnaire    Feeling of Stress : Not at all  Social Connections: Socially Integrated (08/19/2022)   Social Connection and Isolation Panel [NHANES]    Frequency of Communication with Friends and Family: More than three times a week    Frequency of Social Gatherings with Friends and Family: More than three times a week    Attends Religious Services: More than 4 times per year    Active Member of [employer] or Organizations: Yes    Attends Engineer, structural: More than 4 times per year    Marital Status: Married   Allergies  Allergen Reactions   Bee Venom Swelling   Dye Fdc Red [Red Dye #40 (Allura Red)] Swelling and Other (See Comments)    Reaction: gout   Omeprazole Other (See Comments)    Other reaction(s): increased sx on high dose   Atenolol  Other (See Comments)    Did not regulate blood pressure   Benadryl [Diphenhydramine Hcl (Sleep)] Other (See Comments)    Hyperactivity    Enalapril Itching    itching   Gabapentin Palpitations and Rash    rash   Hctz [Hydrochlorothiazide] Other (See Comments)    Decreased potassium   Family History  Problem Relation Age of Onset   Breast cancer Mother    Lung cancer Mother    Bone cancer Mother  Heart Problems Mother    Cancer Mother        breast, lung and rib   AAA (abdominal aortic aneurysm) Mother    Heart disease Mother    Heart attack Father    Heart disease Father    Cancer Brother        esophageal   Heart attack Brother    Colon cancer Neg Hx     Current Outpatient Medications (Endocrine & Metabolic):    calcitRIOL (ROCALTROL) 0.25 MCG capsule, Take 1 capsule (0.25 mcg total) by mouth daily.   dapagliflozin propanediol ([name]) 10 MG TABS tablet, Take 1 tablet (10 mg total) by mouth daily.   insulin degludec ([name] FLEXTOUCH) 200 UNIT/ML FlexTouch Pen, Inject 40 Units into the skin daily.   levothyroxine (SYNTHROID) 100 MCG tablet, TAKE 1 TABLET(100 MCG) BY MOUTH DAILY BEFORE BREAKFAST   Semaglutide, 1 MG/DOSE, (OZEMPIC, 1 MG/DOSE,) 4 MG/3ML SOPN, Inject 1 mg into the skin once a week.   testosterone cypionate (DEPOTESTOSTERONE CYPIONATE) 200 MG/ML injection, Inject 200 mg into the muscle every 14 (fourteen) days.  Current Outpatient Medications (Cardiovascular):    carvedilol (COREG) 6.25 MG tablet, TAKE 1 TABLET(6.25 MG) BY MOUTH TWICE DAILY WITH A MEAL   doxazosin (CARDURA) 4 MG tablet, Take 1 tablet (4 mg total) by mouth daily.   furosemide (LASIX) 40 MG tablet, TAKE 1 TABLET(40 MG) BY MOUTH DAILY   losartan (COZAAR) 25 MG tablet, TAKE 1 TABLET BY MOUTH EVERY MORNING THEN TAKE 2 TABLETS BY MOUTH EVERY EVENING   simvastatin (ZOCOR) 40 MG tablet, TAKE 1 TABLET(40 MG) BY MOUTH AT BEDTIME   tadalafil (CIALIS) 20 MG tablet, Take by mouth.  Current Outpatient  Medications (Respiratory):    loratadine (CLARITIN) 10 MG tablet, Take 10 mg by mouth daily as needed for allergies.   mometasone (NASONEX) 50 MCG/ACT nasal spray, Place 2 sprays into the nose daily.  Current Outpatient Medications (Analgesics):    acetaminophen (TYLENOL) 500 MG tablet, Take 500 mg by mouth every 6 (six) hours as needed.   allopurinol (ZYLOPRIM) 300 MG tablet, Take 300 mg by mouth daily as needed.   aspirin 81 MG tablet, Take 1 tablet (81 mg total) by mouth daily. (Patient taking differently: Take 325 mg by mouth daily.)   HYDROcodone-acetaminophen (NORCO/VICODIN) 5-325 MG tablet, Take 1 tablet by mouth every 6 (six) hours as needed for moderate pain.   meloxicam (MOBIC) 15 MG tablet, Take 1 tablet (15 mg total) by mouth daily.  Current Outpatient Medications (Hematological):    ferrous sulfate 325 (65 FE) MG tablet, Take 1 tablet (325 mg total) by mouth daily with breakfast. Please dispense dye free formulation.  Current Outpatient Medications (Other):    B Complex Vitamins (BL VITAMIN B COMPLEX PO), Take by mouth daily.   Cholecalciferol (VITAMIN D3 PO), Take by mouth daily.   clotrimazole-betamethasone (LOTRISONE) cream, Apply 1 application. topically 2 (two) times daily.   clove oil liquid, Apply 1 application topically as needed.   cyclobenzaprine (FLEXERIL) 10 MG tablet, Take 1 tablet by mouth as needed.   DULoxetine (CYMBALTA) 60 MG capsule, TAKE 1 CAPSULE(60 MG) BY MOUTH DAILY   esomeprazole (NEXIUM) 40 MG capsule, Take 1 capsule (40 mg total) by mouth daily at 12 noon.   finasteride (PROSCAR) 5 MG tablet, TAKE 1 TABLET(5 MG) BY MOUTH AT BEDTIME (Patient taking differently: 5 mg at bedtime as needed. TAKE 1 TABLET(5 MG) BY MOUTH AT BEDTIME)   gabapentin (NEURONTIN) 100 MG capsule, Take 100 mg  by mouth 3 (three) times daily. Takes 200 mg in the morning and 200 mg at bedtime   gentamicin cream (GARAMYCIN) 0.1 %, Apply 1 application. topically 2 (two) times daily.    Insulin Pen Needle (NOVOFINE PEN NEEDLE) 32G X 6 MM MISC, 1 each by Does not apply route 2 (two) times daily.   MAGNESIUM PO, Take 400 mg by mouth daily at 8 pm. 1 tab in the a.m. and 2 at bedtime   Multiple Vitamins-Minerals (MULTIVITAMIN MEN PO), Take 1 tablet by mouth daily.   Needle, Disp, (HYPODERMIC NEEDLE 18GX1") 18G X 1" MISC, Use every 14 days to draw up testosterone   Needle, Disp, (HYPODERMIC NEEDLE 25GX1") 25G X 1" MISC, Use every 14 days to administer testosterone   pregabalin (LYRICA) 200 MG capsule, TAKE 1 CAPSULE(200 MG) BY MOUTH TWICE DAILY   sulfamethoxazole-trimethoprim (BACTRIM DS) 800-160 MG tablet, Take 1 tablet by mouth 2 (two) times daily.   Reviewed prior external information including notes and imaging from  primary care provider As well as notes that were available from care everywhere and other healthcare systems.  Past medical history, social, surgical and family history all reviewed in electronic medical record.  No pertanent information unless stated regarding to the chief complaint.   Review of Systems:  No headache, visual changes, nausea, vomiting, diarrhea, constipation, dizziness, abdominal pain, skin rash, fevers, chills, night sweats, weight loss, swollen lymph nodes, body aches, joint swelling, chest pain, shortness of breath, mood changes. POSITIVE muscle aches  Objective  Blood pressure 122/72, pulse 81, height 5' 11.5" (1.816 m), weight 259 lb (117.5 kg), SpO2 98%.   General: No apparent distress alert and oriented x3 mood and affect normal, dressed appropriately.  HEENT: Pupils equal, extraocular movements intact  Respiratory: Patient's speak in full sentences and does not appear short of breath  Cardiovascular: No lower extremity edema, non tender, no erythema  Knee exam shows arthritic changes noted.  Does have effusion noted of the left knee.  Instability noted with valgus and varus force. Left shoulder does have a positive crossover sign noted.   Rotator cuff strength does appear to be intact though.  After informed written and verbal consent, patient was seated on exam table. Right knee was prepped with alcohol swab and utilizing anterolateral approach, patient's right knee space was injected with 48 mg per 3 mL of Monovisc (sodium hyaluronate) in a prefilled syringe was injected easily into the knee through a 22-gauge needle..Patient tolerated the procedure well without immediate complications.  After informed written and verbal consent, patient was seated on exam table. Left knee was prepped with alcohol swab and utilizing anterolateral approach, patient's left knee space was injected with 48 mg per 3 mL of Monovisc (sodium hyaluronate) in a prefilled syringe was injected easily into the knee through a 22-gauge needle..Patient tolerated the procedure well without immediate complications.  After verbal consent patient was prepped with alcohol swab and with a 25-gauge half inch needle injected into the left acromioclavicular joint with a total of 0.5 cc of 0.5% Marcaine and 0.5 cc of Kenalog 40 mg/mL.  No blood loss, Band-Aid placed.  Postinjection instructions given.    Impression and Recommendations:     The above documentation has been reviewed and is accurate and complete Judi Saa, DO

## 2023-06-24 ENCOUNTER — Encounter: Payer: Self-pay | Admitting: Family Medicine

## 2023-06-24 ENCOUNTER — Ambulatory Visit (INDEPENDENT_AMBULATORY_CARE_PROVIDER_SITE_OTHER)

## 2023-06-24 ENCOUNTER — Ambulatory Visit: Payer: PPO | Admitting: Family Medicine

## 2023-06-24 VITALS — BP 122/72 | HR 81 | Ht 71.5 in | Wt 259.0 lb

## 2023-06-24 DIAGNOSIS — M170 Bilateral primary osteoarthritis of knee: Secondary | ICD-10-CM | POA: Diagnosis not present

## 2023-06-24 DIAGNOSIS — M25512 Pain in left shoulder: Secondary | ICD-10-CM

## 2023-06-24 DIAGNOSIS — G8929 Other chronic pain: Secondary | ICD-10-CM

## 2023-06-24 DIAGNOSIS — M25519 Pain in unspecified shoulder: Secondary | ICD-10-CM | POA: Insufficient documentation

## 2023-06-24 MED ORDER — HYALURONAN 88 MG/4ML IX SOSY
176.0000 mg | PREFILLED_SYRINGE | Freq: Once | INTRA_ARTICULAR | Status: AC
  Administered 2023-06-24: 176 mg via INTRA_ARTICULAR

## 2023-06-24 NOTE — Assessment & Plan Note (Signed)
 Chronic problem with exacerbation.  Discussed icing regimen and home exercises, discussed which activities to do and which ones to avoid.  Increase activity slowly.  Discussed icing regimen.  Encouraged weight loss.  BMI is under 35 so could do surgical intervention is necessary but hopefully we will continue to make improvement without this.  Follow-up again in 6 to 8 weeks otherwise.

## 2023-06-24 NOTE — Assessment & Plan Note (Signed)
 New problem, injection given, discussed icing regimen and home exercises.  Post surgical repair multiple years ago.  Discussed icing regimen and home exercises, increase activity slowly.  Follow-up again in 6 to 8 weeks

## 2023-06-24 NOTE — Patient Instructions (Addendum)
 Gel injections today Xray of shoulder Consider Iron infusion See me in 3 months

## 2023-06-25 ENCOUNTER — Telehealth: Payer: Self-pay | Admitting: Family Medicine

## 2023-06-25 MED ORDER — DOXYCYCLINE HYCLATE 100 MG PO TABS: 100.0000 mg | ORAL_TABLET | Freq: Two times a day (BID) | ORAL | 0 refills | Status: AC

## 2023-06-25 NOTE — Telephone Encounter (Signed)
 Spoke with patient's wife per recommendations.

## 2023-06-25 NOTE — Telephone Encounter (Signed)
 Patient's wife called to follow up an antibiotic that Dr Katrinka Blazing was going to send in for him yesterday at his visit?  Please advise.

## 2023-06-29 ENCOUNTER — Other Ambulatory Visit: Payer: Self-pay

## 2023-06-29 MED ORDER — SIMVASTATIN 40 MG PO TABS: ORAL_TABLET | ORAL | 3 refills | Status: DC

## 2023-06-30 NOTE — Progress Notes (Unsigned)
 [name], NP-C Phone: [phone]  Douglas [name] is a 78 y.o. male who presents today for transfer of care.   Discussed the use of AI scribe software for clinical note transcription with the patient, who gave verbal consent to proceed.  History of Present Illness   Douglas [name] is a 78 year old male who presents for a transfer of care visit and evaluation of iron levels.  He has a history of iron deficiency with recent labs indicating low ferritin levels, though he is not currently anemic. He has not started iron supplements due to concerns about MRI compatibility, but he does not require an MRI. He has a pending appointment with gastroenterology to investigate the cause of his iron deficiency.  His diabetes is managed with Douglas [name], and Ozempic. He previously used metformin but discontinued it. Ozempic has been effective in controlling his diabetes, although he had to reduce the dose from 2 mg to 1 mg due to a supply issue. He experiences excessive thirst and occasional low blood sugars but has not been checking his blood sugar at home. He is managing his diet, particularly reducing carbohydrate intake to help control his blood sugar levels.  He has a history of joint and nerve pain, for which he has received gel shots in his knees and a spinal injection. He experiences diabetic nerve pain in his left foot, exacerbated by recent toenail removal. He takes pregabalin for nerve pain, which is effective in managing his symptoms. He also experiences dizziness, which he attributes to his foot issues.  He has a history of high blood pressure, managed with losartan, Cardura, and Coreg. He stopped simvastatin due to muscle pain and is considering alternative cholesterol medications. He has also stopped allopurinol and is uncertain if his toe pain is related to uric acid crystals or diabetic nerve pain. He continues to take levothyroxine for thyroid management.  He reports recent  sleep disturbances due to personal stress and diabetic nerve pain. He has been awake for two nights, which he attributes to caring for a sick pet and his own pain issues. He has a history of dry eyes and has been evaluated for diabetic retinopathy, with no evidence of the condition.      Social History   Tobacco Use  Smoking Status Never  Smokeless Tobacco Never    Current Outpatient Medications on File Prior to Visit  Medication Sig Dispense Refill   acetaminophen (TYLENOL) 500 MG tablet Take 500 mg by mouth every 6 (six) hours as needed.     aspirin 81 MG tablet Take 1 tablet (81 mg total) by mouth daily. (Patient taking differently: Take 325 mg by mouth daily.)     B Complex Vitamins (BL VITAMIN B COMPLEX PO) Take by mouth daily.     calcitRIOL (ROCALTROL) 0.25 MCG capsule Take 1 capsule (0.25 mcg total) by mouth daily. 30 capsule 5   carvedilol (COREG) 6.25 MG tablet TAKE 1 TABLET(6.25 MG) BY MOUTH TWICE DAILY WITH A MEAL 180 tablet 2   Cholecalciferol (VITAMIN D3 PO) Take by mouth daily.     clove oil liquid Apply 1 application topically as needed.     cyclobenzaprine (FLEXERIL) 10 MG tablet Take 1 tablet by mouth as needed.     dapagliflozin propanediol ([name]) 10 MG TABS tablet Take 1 tablet (10 mg total) by mouth daily. 90 tablet 3   doxazosin (CARDURA) 4 MG tablet Take 1 tablet (4 mg total) by mouth daily. 90 tablet 1  doxycycline (VIBRA-TABS) 100 MG tablet Take 1 tablet (100 mg total) by mouth 2 (two) times daily for 7 days. 14 tablet 0   DULoxetine (CYMBALTA) 60 MG capsule TAKE 1 CAPSULE(60 MG) BY MOUTH DAILY 90 capsule 3   esomeprazole (NEXIUM) 40 MG capsule Take 1 capsule (40 mg total) by mouth daily at 12 noon. 90 capsule 3   finasteride (PROSCAR) 5 MG tablet TAKE 1 TABLET(5 MG) BY MOUTH AT BEDTIME (Patient taking differently: 5 mg at bedtime as needed. TAKE 1 TABLET(5 MG) BY MOUTH AT BEDTIME) 90 tablet 3   furosemide (LASIX) 40 MG tablet TAKE 1 TABLET(40 MG) BY MOUTH DAILY  90 tablet 1   gabapentin (NEURONTIN) 100 MG capsule Take 100 mg by mouth 3 (three) times daily. Takes 200 mg in the morning and 200 mg at bedtime     gentamicin cream (GARAMYCIN) 0.1 % Apply 1 application. topically 2 (two) times daily. 30 g 1   HYDROcodone-acetaminophen (NORCO/VICODIN) 5-325 MG tablet Take 1 tablet by mouth every 6 (six) hours as needed for moderate pain. 30 tablet 0   insulin degludec ([name] FLEXTOUCH) 200 UNIT/ML FlexTouch Pen Inject 40 Units into the skin daily. 15 mL 3   Insulin Pen Needle (NOVOFINE PEN NEEDLE) 32G X 6 MM MISC 1 each by Does not apply route 2 (two) times daily. 1 each 3   levothyroxine (SYNTHROID) 100 MCG tablet TAKE 1 TABLET(100 MCG) BY MOUTH DAILY BEFORE BREAKFAST 90 tablet 1   loratadine (CLARITIN) 10 MG tablet Take 10 mg by mouth daily as needed for allergies.     losartan (COZAAR) 25 MG tablet TAKE 1 TABLET BY MOUTH EVERY MORNING THEN TAKE 2 TABLETS BY MOUTH EVERY EVENING 270 tablet 3   mometasone (NASONEX) 50 MCG/ACT nasal spray Place 2 sprays into the nose daily. 1 each 12   Multiple Vitamins-Minerals (MULTIVITAMIN MEN PO) Take 1 tablet by mouth daily.     Needle, Disp, (HYPODERMIC NEEDLE 18GX1") 18G X 1" MISC Use every 14 days to draw up testosterone     Needle, Disp, (HYPODERMIC NEEDLE 25GX1") 25G X 1" MISC Use every 14 days to administer testosterone     pregabalin (LYRICA) 200 MG capsule TAKE 1 CAPSULE(200 MG) BY MOUTH TWICE DAILY 180 capsule 0   Semaglutide, 1 MG/DOSE, (OZEMPIC, 1 MG/DOSE,) 4 MG/3ML SOPN Inject 1 mg into the skin once a week. 3 mL 3   tadalafil (CIALIS) 20 MG tablet Take by mouth.     testosterone cypionate (DEPOTESTOSTERONE CYPIONATE) 200 MG/ML injection Inject 200 mg into the muscle every 14 (fourteen) days.     MAGNESIUM PO Take 400 mg by mouth daily at 8 pm. 1 tab in the a.m. and 2 at bedtime (Patient not taking: Reported on 07/01/2023)     No current facility-administered medications on file prior to visit.    ROS see  history of present illness  Objective  Physical Exam Vitals:   07/01/23 0950 07/01/23 1026  BP: (!) 144/80 (!) 162/90  Pulse: 81   Temp: 98.1 F (36.7 C)   SpO2: 97%     BP Readings from Last 3 Encounters:  07/01/23 (!) 162/90  06/24/23 122/72  06/03/23 (!) 143/92   Wt Readings from Last 3 Encounters:  07/01/23 257 lb 12.8 oz (116.9 kg)  06/24/23 259 lb (117.5 kg)  06/16/23 265 lb (120.2 kg)    Physical Exam Constitutional:      General: He is not in acute distress.    Appearance: Normal appearance.  HENT:     Head: Normocephalic.  Cardiovascular:     Rate and Rhythm: Normal rate and regular rhythm.     Heart sounds: Normal heart sounds.  Pulmonary:     Effort: Pulmonary effort is normal.     Breath sounds: Normal breath sounds.  Skin:    General: Skin is warm and dry.  Neurological:     General: No focal deficit present.     Mental Status: He is alert.  Psychiatric:        Mood and Affect: Mood normal.        Behavior: Behavior normal.     Assessment/Plan: Please see individual problem list.  Iron deficiency Assessment & Plan: Iron deficiency without anemia is present. MRI is not required, and iron supplementation is feasible. Start oral iron supplementation and take a stool softener to prevent constipation. Consume vitamin C to enhance iron absorption and avoid grapefruit juice. Order repeat blood work to monitor iron levels and refer to hematology for further evaluation as he is interested in infusions. Follow up with GI as scheduled for further evaluation.   Orders: -     CBC with Differential/Platelet -     IBC + Ferritin -     Ambulatory referral to Hematology / Oncology  Essential hypertension Assessment & Plan: Elevated blood pressure today in office x 2, previously well controlled. Likely due to increased pain and lack of sleep recently, managed with losartan, Cardura, and Coreg. Hx of orthostatic syncope. Continue current medication regimen and  encourage monitoring blood pressure daily at home. Advised to contact if remaining consistently elevated over 140/90. We will continue to monitor and adjust medication if necessary.    DM type 2 with diabetic peripheral neuropathy [facility]) Assessment & Plan: Diabetes is managed with Douglas [name], and Ozempic. The Ozempic dose was reduced due to a supply issue. Excessive thirst is noted, with no hypoglycemia, and he is not monitoring blood sugar at home. Continue Douglas [name], and Ozempic. Consider increasing Ozempic to 2 mg if control is inadequate. We will check A1c today. He is advised to moderate carbohydrate intake for diabetes and weight control. A recent eye exam showed no diabetic retinopathy. Encourage moderation of carbohydrate intake and continue regular eye exams.   Orders: -     Hemoglobin A1c  Hypothyroidism, unspecified type Assessment & Plan: He is on levothyroxine with a recent dosage adjustment and reports weight gain, possibly related to thyroid function. Continue levothyroxine and check thyroid function tests.  Orders: -     TSH  Hyperlipidemia, unspecified hyperlipidemia type Assessment & Plan: Simvastatin was discontinued due to muscle aches. Cardiovascular risk management was discussed, and pravastatin is considered. Prescribe pravastatin 20 mg daily and monitor for muscle aches, adjusting treatment as necessary. We will plan to check lipid panel at next visit.   Orders: -     Pravastatin Sodium; Take 1 tablet (20 mg total) by mouth daily.  Dispense: 90 tablet; Refill: 3  Chronic gout due to renal impairment involving foot without tophus, unspecified laterality Assessment & Plan: Allopurinol was stopped, and it is uncertain if toe pain is due to uric acid or neuropathy. Order a uric acid level test and consider restarting allopurinol if uric acid levels are elevated.  Orders: -     Uric acid  Chronic painful diabetic neuropathy (HCC) Assessment &  Plan: Diabetic nerve pain in the left foot is managed with pregabalin under pain management care. Continue pregabalin and follow up with the pain  management specialist.     Return in about 3 months (around 10/01/2023) for Follow up.   [name], NP-C [facility]

## 2023-07-01 ENCOUNTER — Encounter: Payer: Self-pay | Admitting: Student in an Organized Health Care Education/Training Program

## 2023-07-01 ENCOUNTER — Encounter: Payer: Self-pay | Admitting: Nurse Practitioner

## 2023-07-01 ENCOUNTER — Ambulatory Visit
Payer: PPO | Attending: Student in an Organized Health Care Education/Training Program | Admitting: Student in an Organized Health Care Education/Training Program

## 2023-07-01 ENCOUNTER — Ambulatory Visit (INDEPENDENT_AMBULATORY_CARE_PROVIDER_SITE_OTHER): Payer: PPO | Admitting: Nurse Practitioner

## 2023-07-01 VITALS — BP 106/70 | HR 83 | Temp 97.1°F | Resp 18 | Ht 71.0 in | Wt 258.0 lb

## 2023-07-01 VITALS — BP 162/90 | HR 81 | Temp 98.1°F | Ht 71.5 in | Wt 257.8 lb

## 2023-07-01 DIAGNOSIS — M4726 Other spondylosis with radiculopathy, lumbar region: Secondary | ICD-10-CM | POA: Diagnosis not present

## 2023-07-01 DIAGNOSIS — G8929 Other chronic pain: Secondary | ICD-10-CM | POA: Insufficient documentation

## 2023-07-01 DIAGNOSIS — E1140 Type 2 diabetes mellitus with diabetic neuropathy, unspecified: Secondary | ICD-10-CM | POA: Diagnosis present

## 2023-07-01 DIAGNOSIS — I10 Essential (primary) hypertension: Secondary | ICD-10-CM

## 2023-07-01 DIAGNOSIS — E1142 Type 2 diabetes mellitus with diabetic polyneuropathy: Secondary | ICD-10-CM | POA: Diagnosis not present

## 2023-07-01 DIAGNOSIS — E039 Hypothyroidism, unspecified: Secondary | ICD-10-CM

## 2023-07-01 DIAGNOSIS — Z794 Long term (current) use of insulin: Secondary | ICD-10-CM

## 2023-07-01 DIAGNOSIS — Z7985 Long-term (current) use of injectable non-insulin antidiabetic drugs: Secondary | ICD-10-CM

## 2023-07-01 DIAGNOSIS — E611 Iron deficiency: Secondary | ICD-10-CM | POA: Diagnosis not present

## 2023-07-01 DIAGNOSIS — M5416 Radiculopathy, lumbar region: Secondary | ICD-10-CM | POA: Diagnosis present

## 2023-07-01 DIAGNOSIS — M48062 Spinal stenosis, lumbar region with neurogenic claudication: Secondary | ICD-10-CM | POA: Insufficient documentation

## 2023-07-01 DIAGNOSIS — M1A3790 Chronic gout due to renal impairment, unspecified ankle and foot, without tophus (tophi): Secondary | ICD-10-CM | POA: Diagnosis not present

## 2023-07-01 DIAGNOSIS — M47816 Spondylosis without myelopathy or radiculopathy, lumbar region: Secondary | ICD-10-CM | POA: Insufficient documentation

## 2023-07-01 DIAGNOSIS — E785 Hyperlipidemia, unspecified: Secondary | ICD-10-CM

## 2023-07-01 LAB — CBC WITH DIFFERENTIAL/PLATELET
Basophils Absolute: 0.1 10*3/uL (ref 0.0–0.1)
Basophils Relative: 0.7 % (ref 0.0–3.0)
Eosinophils Absolute: 0.5 10*3/uL (ref 0.0–0.7)
Eosinophils Relative: 5.5 % — ABNORMAL HIGH (ref 0.0–5.0)
HCT: 42.1 % (ref 39.0–52.0)
Hemoglobin: 13.5 g/dL (ref 13.0–17.0)
Lymphocytes Relative: 24.3 % (ref 12.0–46.0)
Lymphs Abs: 2.1 10*3/uL (ref 0.7–4.0)
MCHC: 32 g/dL (ref 30.0–36.0)
MCV: 79.5 fl (ref 78.0–100.0)
Monocytes Absolute: 0.7 10*3/uL (ref 0.1–1.0)
Monocytes Relative: 8 % (ref 3.0–12.0)
Neutro Abs: 5.2 10*3/uL (ref 1.4–7.7)
Neutrophils Relative %: 61.5 % (ref 43.0–77.0)
Platelets: 256 10*3/uL (ref 150.0–400.0)
RBC: 5.3 Mil/uL (ref 4.22–5.81)
RDW: 22.1 % — ABNORMAL HIGH (ref 11.5–15.5)
WBC: 8.5 10*3/uL (ref 4.0–10.5)

## 2023-07-01 LAB — IBC + FERRITIN
Ferritin: 13.1 ng/mL — ABNORMAL LOW (ref 22.0–322.0)
Iron: 50 ug/dL (ref 42–165)
Saturation Ratios: 13.4 % — ABNORMAL LOW (ref 20.0–50.0)
TIBC: 373.8 ug/dL (ref 250.0–450.0)
Transferrin: 267 mg/dL (ref 212.0–360.0)

## 2023-07-01 LAB — URIC ACID: Uric Acid, Serum: 7.3 mg/dL (ref 4.0–7.8)

## 2023-07-01 LAB — HEMOGLOBIN A1C: Hgb A1c MFr Bld: 7.9 % — ABNORMAL HIGH (ref 4.6–6.5)

## 2023-07-01 LAB — TSH: TSH: 3.13 u[IU]/mL (ref 0.35–5.50)

## 2023-07-01 MED ORDER — PRAVASTATIN SODIUM 20 MG PO TABS: 20.0000 mg | ORAL_TABLET | Freq: Every day | ORAL | 3 refills | Status: AC

## 2023-07-01 NOTE — Progress Notes (Signed)
 PROVIDER NOTE: Information contained herein reflects review and annotations entered in association with encounter. Interpretation of such information and data should be left to medically-trained personnel. Information provided to patient can be located elsewhere in the medical record under "Patient Instructions". Document created using STT-dictation technology, any transcriptional errors that may result from process are unintentional.    Patient: Douglas [name]  Service Category: E/M  Provider: Edward Jolly, MD  DOB: 1945-10-21  DOS: 07/01/2023  Referring Provider: Glori Luis, MD  MRN: [id number]  Specialty: Interventional Pain Management  PCP: [name], NP  Type: Established Patient  Setting: Ambulatory outpatient    Location: Office  Delivery: Face-to-face     HPI  Douglas [name], a 78 y.o. year old male, is here today because of his Spinal stenosis, lumbar region, with neurogenic claudication [M48.062]. Douglas [name] primary complain today is No chief complaint on file.   Vitals:  height is 5\' 11"  (1.803 m) and weight is 258 lb (117 kg). His temporal temperature is 97.1 F (36.2 C) (abnormal). His blood pressure is 106/70 and his pulse is 83. His respiration is 18 and oxygen saturation is 95%.  BMI: Estimated body mass index is 35.98 kg/m as calculated from the following:   Height as of this encounter: 5\' 11"  (1.803 m).   Weight as of this encounter: 258 lb (117 kg). Last encounter: 05/19/2023. Last procedure: 06/03/2023.  Reason for encounter:   History of Present Illness   Douglas [name] is a 78 year old male who presents for follow-up regarding pain management after left L5-S1 ESI and Qutenza treatment.  He experienced immediate and ongoing pain relief from the injection received one month ago, which has facilitated walking and engaging in activities. He has a history of receiving epidural injections, which have provided long-term relief, with the most  recent one prior to this 1 lasting almost three years. Previously, he required muscle relaxers and Tylenol for pain management.  He mentions having received gel injections in his knees, which have significantly improved his knee pain. Currently, he experiences no knee pain.  He describes experiencing diabetic nerve pain in the last four toes of his foot. He previously underwent a Qutenza wrap for his feet, which initially caused tenderness and cramps but ultimately provided relief.  A podiatrist removed his two big toenails due to ingrown nails and infection, which was managed effectively.      Post-procedure evaluation    Type: Lumbar epidural steroid injection (LESI) (interlaminar) #1    Laterality: Left   Level:  L5-S1 Level.  Imaging: Fluoroscopic guidance         Anesthesia: Local anesthesia (1-2% Lidocaine) DOS: 06/03/2023  Performed by: Edward Jolly, MD  Purpose: Diagnostic/Therapeutic Indications: Lumbar radicular pain of intraspinal etiology of more than 4 weeks that has failed to respond to conservative therapy and is severe enough to impact quality of life or function. 1. Spinal stenosis, lumbar region, with neurogenic claudication   2. Chronic radicular lumbar pain   3. Lumbar facet arthropathy   4. Chronic painful diabetic neuropathy (HCC)    NAS-11 Pain score:   Pre-procedure: 6 /10   Post-procedure: 0-No pain/10     Effectiveness:  Initial hour after procedure: 100 %  Subsequent 4-6 hours post-procedure: 100 %  Analgesia past initial 6 hours: 100 %  Ongoing improvement:  Analgesic: 100% Function: Douglas [name] reports improvement in function ROM: Douglas [name] reports improvement in ROM    ROS  Constitutional: Denies any  fever or chills Gastrointestinal: No reported hemesis, hematochezia, vomiting, or acute GI distress Musculoskeletal: Denies any acute onset joint swelling, redness, loss of ROM, or weakness Neurological: No reported episodes of acute onset  apraxia, aphasia, dysarthria, agnosia, amnesia, paralysis, loss of coordination, or loss of consciousness  Medication Review  B Complex Vitamins, Cholecalciferol, DULoxetine, HYDROcodone-acetaminophen, HYPODERMIC NEEDLE 18GX1", HYPODERMIC NEEDLE 25GX1", Insulin Pen Needle, Magnesium, Multiple Vitamins-Minerals, Semaglutide (1 MG/DOSE), acetaminophen, aspirin, calcitRIOL, carvedilol, clove oil, cyclobenzaprine, dapagliflozin propanediol, doxazosin, doxycycline, esomeprazole, finasteride, furosemide, gabapentin, gentamicin cream, insulin degludec, levothyroxine, loratadine, losartan, mometasone, pravastatin, pregabalin, tadalafil, and testosterone cypionate  History Review  Allergy: Douglas [name] is allergic to bee venom, omeprazole, benadryl [diphenhydramine hcl (sleep)], enalapril, gabapentin, and hctz [hydrochlorothiazide]. Drug: Douglas [name]  reports no history of drug use. Alcohol:  reports no history of alcohol use. Tobacco:  reports that he has never smoked. He has never used smokeless tobacco. Social: [name]  reports that he has never smoked. He has never used smokeless tobacco. He reports that he does not drink alcohol and does not use drugs. Medical:  has a past medical history of Arthritis, Depression, Diabetes mellitus with complication (HCC), Diabetic neuropathy (HCC), Diastolic dysfunction, Edema, GERD (gastroesophageal reflux disease), Gout, History of stress test, Hypertension, Hypothyroidism, Kidney cysts, Kidney stones, OSA (obstructive sleep apnea), Oxygen dependent, [place] spotted fever (12/10/2017), S/P cardiac cath (1998), Syncope and collapse, and Temporary low platelet count (HCC). Surgical: [name]  has a past surgical history that includes Knee surgery (1993); Back surgery (1977); Shoulder surgery (Bilateral, 1998); Anal fissure repair; Finger amputation; Cataract extraction w/PHACO (Right, 07/16/2016); Cataract extraction w/PHACO (Left, 08/13/2016); and Cardiac  catheterization (1998). Family: family history includes AAA (abdominal aortic aneurysm) in his mother; Bone cancer in his mother; Breast cancer in his mother; Cancer in his brother and mother; Heart Problems in his mother; Heart attack in his brother and father; Heart disease in his father and mother; Lung cancer in his mother.  Laboratory Chemistry Profile   Renal Lab Results  Component Value Date   BUN 13 04/01/2023   CREATININE 1.57 (H) 04/01/2023   BCR 8 (L) 11/12/2018   GFR 42.15 (L) 04/01/2023   GFRAA 53 (L) 01/19/2019   GFRNONAA 46 (L) 01/19/2019    Hepatic Lab Results  Component Value Date   AST 14 04/01/2023   ALT 13 04/01/2023   ALBUMIN 3.8 04/01/2023   ALKPHOS 74 04/01/2023   HCVAB <0.1 06/07/2015   LIPASE 388 03/23/2012    Electrolytes Lab Results  Component Value Date   NA 136 04/01/2023   K 4.0 04/01/2023   CL 99 04/01/2023   CALCIUM 8.9 04/01/2023    Bone Lab Results  Component Value Date   VD25OH 83 05/02/2013   TESTOSTERONE 199.59 (L) 11/23/2019    Inflammation (CRP: Acute Phase) (ESR: Chronic Phase) No results found for: "CRP", "ESRSEDRATE", "LATICACIDVEN"       Note: Above Lab results reviewed.  Recent Imaging Review  DG PAIN CLINIC C-ARM 1-60 MIN NO REPORT Fluoro was used, but no Radiologist interpretation will be provided.  Please refer to "NOTES" tab for provider progress note. Note: Reviewed        Physical Exam  General appearance: Well nourished, well developed, and well hydrated. In no apparent acute distress Mental status: Alert, oriented x 3 (person, place, & time)       Respiratory: No evidence of acute respiratory distress Eyes: PERLA Vitals: BP 106/70 (Cuff Size: Large)   Pulse 83   Temp (!)  97.1 F (36.2 C) (Temporal)   Resp 18   Ht 5\' 11"  (1.803 m)   Wt 258 lb (117 kg)   SpO2 95%   BMI 35.98 kg/m  BMI: Estimated body mass index is 35.98 kg/m as calculated from the following:   Height as of this encounter: 5\' 11"  (1.803  m).   Weight as of this encounter: 258 lb (117 kg). Ideal: Ideal body weight: 75.3 kg (166 lb 0.1 oz) Adjusted ideal body weight: 92 kg (202 lb 12.9 oz)  Paresthesias of bilateral feet  Assessment   Diagnosis  1. Spinal stenosis, lumbar region, with neurogenic claudication   2. Chronic radicular lumbar pain   3. Lumbar facet arthropathy   4. Chronic painful diabetic neuropathy (HCC)      Updated Problems: No problems updated.  Plan of Care  Problem-specific:  Assessment and Plan    Chronic Pain   He experienced significant relief from chronic pain after an epidural injection one month ago, which improved his mobility and activity. Epidural injections have previously provided benefits lasting up to three years. Place an as-needed order for epidural injections, allowing activation without an appointment. Advise him to contact the clinic for scheduling if pain flares up.  Knee Osteoarthritis   Knee pain improved following gel injections administered by Dr. Katrinka Blazing in [place], significantly alleviating pain and improving function.  Diabetic Neuropathy   He experiences diabetic neuropathic pain in the last four toes of the foot. Previous Qutenza treatment provided relief after initial tenderness and cramps. It is recommended every three months for the first year to achieve long-term benefits, then as needed if long-term impact is achieved. Order Qutenza treatment every three months, with the next session scheduled for the end of May.  Ingrown Toenails with Infection   Ingrown toenails with infection were treated by a podiatrist through toenail removal to control the source of infection.       Mr. AMAL SAIKI has a current medication list which includes the following long-term medication(s): carvedilol, doxazosin, duloxetine, esomeprazole, furosemide, levothyroxine, loratadine, losartan, mometasone, pravastatin, pregabalin, tadalafil, testosterone cypionate, and  gabapentin.  Pharmacotherapy (Medications Ordered): No orders of the defined types were placed in this encounter.  Orders:  Orders Placed This Encounter  Procedures   NEUROLYSIS    Please order Qutenza patches from pharmacy    Standing Status:   Future    Expected Date:   09/02/2023    Expiration Date:   10/01/2023    Where will this procedure be performed?:   [facility]   Lumbar Epidural Injection    Standing Status:   Standing    Number of Occurrences:   2    Next Expected Occurrence:   09/18/2023    Expiration Date:   06/30/2024    Scheduling Instructions:     Procedure: Interlaminar Lumbar Epidural Steroid injection (LESI)            Laterality: Left L5/S1     Sedation: Patient's choice.     Timeframe: PRN    Where will this procedure be performed?:   [facility]   Follow-up plan:   Return in about 9 weeks (around 09/02/2023) for Qutenza #2.      Left L5/S1 ESI, Qutenza 06/03/23    Recent Visits Date Type Provider Dept  06/03/23 Procedure visit Edward Jolly, MD [facility]  05/19/23 Office Visit Edward Jolly, MD [facility]  Showing recent visits within past 90 days and meeting all other  requirements Today's Visits Date Type Provider Dept  07/01/23 Office Visit Edward Jolly, MD [facility]  Showing today's visits and meeting all other requirements Future Appointments Date Type Provider Dept  08/31/23 Appointment Edward Jolly, MD [facility]  Showing future appointments within next 90 days and meeting all other requirements  I discussed the assessment and treatment plan with the patient. The patient was provided an opportunity to ask questions and all were answered. The patient agreed with the plan and demonstrated an understanding of the instructions.  Patient advised to call back or seek an in-person evaluation if the symptoms or condition worsens.  Duration of encounter: .  Total time on  encounter, as per AMA guidelines included both the face-to-face and non-face-to-face time personally spent by the physician and/or other qualified health care professional(s) on the day of the encounter (includes time in activities that require the physician or other qualified health care professional and does not include time in activities normally performed by clinical staff). Physician's time may include the following activities when performed: Preparing to see the patient (e.g., pre-charting review of records, searching for previously ordered imaging, lab work, and nerve conduction tests) Review of prior analgesic pharmacotherapies. Reviewing PMP Interpreting ordered tests (e.g., lab work, imaging, nerve conduction tests) Performing post-procedure evaluations, including interpretation of diagnostic procedures Obtaining and/or reviewing separately obtained history Performing a medically appropriate examination and/or evaluation Counseling and educating the patient/family/caregiver Ordering medications, tests, or procedures Referring and communicating with other health care professionals (when not separately reported) Documenting clinical information in the electronic or other health record Independently interpreting results (not separately reported) and communicating results to the patient/ family/caregiver Care coordination (not separately reported)  Note by: Edward Jolly, MD Date: 07/01/2023; Time: 3:43 PM

## 2023-07-01 NOTE — Assessment & Plan Note (Addendum)
 Diabetes is managed with Beaulah Corin, and Ozempic. The Ozempic dose was reduced due to a supply issue. Excessive thirst is noted, with no hypoglycemia, and he is not monitoring blood sugar at home. Continue Beaulah Corin, and Ozempic. Consider increasing Ozempic to 2 mg if control is inadequate. We will check A1c today. He is advised to moderate carbohydrate intake for diabetes and weight control. A recent eye exam showed no diabetic retinopathy. Encourage moderation of carbohydrate intake and continue regular eye exams.

## 2023-07-01 NOTE — Assessment & Plan Note (Signed)
 Iron deficiency without anemia is present. MRI is not required, and iron supplementation is feasible. Start oral iron supplementation and take a stool softener to prevent constipation. Consume vitamin C to enhance iron absorption and avoid grapefruit juice. Order repeat blood work to monitor iron levels and refer to hematology for further evaluation as he is interested in infusions. Follow up with GI as scheduled for further evaluation.

## 2023-07-01 NOTE — Assessment & Plan Note (Signed)
 Simvastatin was discontinued due to muscle aches. Cardiovascular risk management was discussed, and pravastatin is considered. Prescribe pravastatin 20 mg daily and monitor for muscle aches, adjusting treatment as necessary. We will plan to check lipid panel at next visit.

## 2023-07-01 NOTE — Assessment & Plan Note (Signed)
 Diabetic nerve pain in the left foot is managed with pregabalin under pain management care. Continue pregabalin and follow up with the pain management specialist.

## 2023-07-01 NOTE — Assessment & Plan Note (Signed)
 He is on levothyroxine with a recent dosage adjustment and reports weight gain, possibly related to thyroid function. Continue levothyroxine and check thyroid function tests.

## 2023-07-01 NOTE — Patient Instructions (Signed)

## 2023-07-01 NOTE — Assessment & Plan Note (Signed)
 Allopurinol was stopped, and it is uncertain if toe pain is due to uric acid or neuropathy. Order a uric acid level test and consider restarting allopurinol if uric acid levels are elevated.

## 2023-07-01 NOTE — Assessment & Plan Note (Signed)
 Elevated blood pressure today in office x 2, previously well controlled. Likely due to increased pain and lack of sleep recently, managed with losartan, Cardura, and Coreg. Hx of orthostatic syncope. Continue current medication regimen and encourage monitoring blood pressure daily at home. Advised to contact if remaining consistently elevated over 140/90. We will continue to monitor and adjust medication if necessary.

## 2023-07-02 ENCOUNTER — Encounter: Payer: Self-pay | Admitting: Pharmacist

## 2023-07-02 ENCOUNTER — Telehealth: Payer: Self-pay

## 2023-07-02 NOTE — Telephone Encounter (Signed)
 Hey Ms. Werner Lean would like to have pt increase his Ozempic to 2 mg he is currently on 1 mg and his patient assistance program is through [employer].    Can we get a new application started for the dose change?

## 2023-07-02 NOTE — Progress Notes (Signed)
 Patient Assistance Program (PAP) Application   Manufacturer: [employer]    (Currently enrolled  -  dose change request) Medication(s): Ozempic increase to 2.0 mg weekly  Novo Dose Change Request Form  07/02/23: Filled out by PharmD, uploaded to PCP eFax folder  Next Steps: [x]    PCP review/signature [x]    Upon PCP signature Application to be faxed to [employer] Fax: [phone] - faxed 07/07/23  Forwarded to [facility] Patient Advocate Team for future correspondences/re-enrollment.  Note routed to PCP Clinic Pool to ensure PCP signature is obtained and application is faxed.

## 2023-07-03 ENCOUNTER — Ambulatory Visit: Admitting: Podiatry

## 2023-07-03 ENCOUNTER — Encounter: Payer: Self-pay | Admitting: Podiatry

## 2023-07-03 DIAGNOSIS — L600 Ingrowing nail: Secondary | ICD-10-CM

## 2023-07-03 NOTE — Progress Notes (Signed)
   Chief Complaint  Patient presents with   Nail Problem    "It's doing good."    Subjective: 78 y.o. male presents today status post permanent nail avulsion procedure of the bilateral great toes that was performed on 06/16/2023.  Patient doing well.  He feels significantly better.  No new complaints.   Past Medical History:  Diagnosis Date   Arthritis    Depression    Diabetes mellitus with complication (HCC)    Diabetic neuropathy (HCC)    Diastolic dysfunction    a. TTE 07/2017: EF 60-65%, mild concentric LVH, no RWMA, Gr1DD, trivial AI, mildly dilated LA, RVSF normal, PASP normal   Edema    feet/legs   GERD (gastroesophageal reflux disease)    Gout    History of stress test    a. MV 06/2017: small in size, mild in severity, apical anterior and apical defect that was minimally reversible and most likely represented artifact and less likely ischemia/scar, LVEF 55-65%, low risk, probably normal stress test   Hypertension    Hypothyroidism    Kidney cysts    renal failure 2013   Kidney stones    Dr. Artis Flock   OSA (obstructive sleep apnea)    supplemental oxygen at night   Oxygen dependent    hs   [facility] spotted fever 12/10/2017   Positve IgG in titer on 11/20/17   S/P cardiac cath 1998   a. no obstructive disease   Syncope and collapse    Temporary low platelet count (HCC)     Objective: Neurovascular status intact.  Appropriate healing noted to the wound base of the bilateral great toes.  Assessment: #1 s/p total permanent nail matrixectomy bilateral great toes   Plan of care: #1 patient was evaluated  #2 light debridement of the periungual debris was performed to the border of the respective toe and nail plate using a tissue nipper. #3 patient is to return to clinic on a PRN basis.   Felecia Shelling, DPM [facility]  Dr. Felecia Shelling, DPM    [address] [place], [place] [address]                Office  [phone]  Fax [phone]

## 2023-07-07 ENCOUNTER — Telehealth: Payer: Self-pay

## 2023-07-07 NOTE — Telephone Encounter (Signed)
 E fax received from [employer] in regards to pts refill for Novo[employer] 32G Tip needles   Form has been placed in provider to be signed folder.

## 2023-07-09 NOTE — Telephone Encounter (Signed)
 Forms faxed back to [employer] at [phone] with a  completed transmission log

## 2023-07-20 ENCOUNTER — Other Ambulatory Visit: Payer: Self-pay | Admitting: Family Medicine

## 2023-07-20 ENCOUNTER — Ambulatory Visit (INDEPENDENT_AMBULATORY_CARE_PROVIDER_SITE_OTHER)

## 2023-07-20 ENCOUNTER — Encounter: Payer: Self-pay | Admitting: Family Medicine

## 2023-07-20 ENCOUNTER — Other Ambulatory Visit: Payer: Self-pay

## 2023-07-20 ENCOUNTER — Ambulatory Visit (INDEPENDENT_AMBULATORY_CARE_PROVIDER_SITE_OTHER): Admitting: Family Medicine

## 2023-07-20 VITALS — BP 138/82 | HR 75 | Temp 98.0°F | Resp 20 | Ht 71.0 in | Wt 263.1 lb

## 2023-07-20 DIAGNOSIS — E538 Deficiency of other specified B group vitamins: Secondary | ICD-10-CM | POA: Diagnosis not present

## 2023-07-20 DIAGNOSIS — L600 Ingrowing nail: Secondary | ICD-10-CM

## 2023-07-20 DIAGNOSIS — L089 Local infection of the skin and subcutaneous tissue, unspecified: Secondary | ICD-10-CM

## 2023-07-20 MED ORDER — CALCITRIOL 0.25 MCG PO CAPS: 0.2500 ug | ORAL_CAPSULE | Freq: Every day | ORAL | 0 refills | Status: DC

## 2023-07-20 MED ORDER — CYANOCOBALAMIN 1000 MCG/ML IJ SOLN
1000.0000 ug | Freq: Once | INTRAMUSCULAR | Status: AC
  Administered 2023-07-20: 1000 ug via INTRAMUSCULAR

## 2023-07-20 MED ORDER — PREGABALIN 200 MG PO CAPS
ORAL_CAPSULE | ORAL | 0 refills | Status: DC
Start: 2023-07-20 — End: 2023-08-18

## 2023-07-20 NOTE — Patient Instructions (Addendum)
 It was a pleasure meeting you today. Thank you for allowing me to take part in your health care.  Our goals for today as we discussed include:  Warm soak frequently Follow up with Foot doctor   Will get blood work and xrays today  If elevated will call in prescription for antibiotics   Follow up with PCP as needed   This is a list of the screening recommended for you and due dates:  Health Maintenance  Topic Date Due   Zoster (Shingles) Vaccine (1 of 2) Never done   DTaP/Tdap/Td vaccine (2 - Td or Tdap) 01/04/2018   Eye exam for diabetics  03/12/2021   Yearly kidney health urinalysis for diabetes  03/06/2023   Medicare Annual Wellness Visit  08/19/2023   Flu Shot  11/13/2023   Hemoglobin A1C  01/01/2024   Yearly kidney function blood test for diabetes  03/31/2024   Complete foot exam   03/31/2024   Pneumonia Vaccine  Completed   Hepatitis C Screening  Completed   HPV Vaccine  Aged Out   COVID-19 Vaccine  Discontinued      If you have any questions or concerns, please do not hesitate to call the office at [phone].  I look forward to our next visit and until then take care and stay safe.  Regards,   Dana Allan, MD   [facility] It was a pleasure meeting you today. Thank you for allowing me to take part in your health care.  Our goals for today as we discussed include:     If you have any questions or concerns, please do not hesitate to call the office at [phone].  I look forward to our next visit and until then take care and stay safe.  Regards,   Dana Allan, MD   [facility]

## 2023-07-20 NOTE — Progress Notes (Signed)
 SUBJECTIVE:   Chief Complaint  Patient presents with   Toe Pain    Infection in both big toes on the side    HPI Presents for acute visit  Discussed the use of AI scribe software for clinical note transcription with the patient, who gave verbal consent to proceed.  History of Present Illness Douglas [name] is a 78 year old male with diabetic neuropathy who presents with infected toes.  He attributes the infected toes to diabetic neuropathy and ingrown toenails, experiencing significant pain, particularly on the right side. He uses silver nitrate and Silver Cell cream for treatment. He notes a black area on one toe and a hard area on another, suspecting bone deterioration. No fever or worsening discharge, although a previous discharge in the center toe has resolved. He applies salve and a Band-Aid as advised by his podiatrist, whom he last saw two weeks ago.  He has a history of diabetic neuropathy and ingrown toenails, with initial toenail removal in June of the previous year. The nails grew back, leading to further issues, including pus and possible fungal infection, treated with silver nitrate. Despite treatment, he continues to experience pain, for which he uses Tylenol , pregabalin , Aleve, and clove oil, but avoids stronger pain medications due to side effects. Neuropathy in four toes causes them to stick together, contributing to the pain.   He is on [name]  and Ozempic  for diabetes management, with his last blood sugar reading being 280 mg/dL after eating. He does not regularly monitor his blood sugar levels.    PERTINENT PMH / PSH: As above  OBJECTIVE:  BP 138/82   Pulse 75   Temp 98 F (36.7 C)   Resp 20   Ht 5\' 11"  (1.803 m)   Wt 263 lb 2 oz (119.4 kg)   SpO2 97%   BMI 36.70 kg/m    Physical Exam Vitals reviewed.  Constitutional:      General: He is not in acute distress.    Appearance: Normal appearance. He is normal weight. He is not ill-appearing,  toxic-appearing or diaphoretic.  Eyes:     General:        Right eye: No discharge.        Left eye: No discharge.  Cardiovascular:     Rate and Rhythm: Normal rate.  Pulmonary:     Effort: Pulmonary effort is normal.  Musculoskeletal:        General: No swelling or tenderness. Normal range of motion.     Cervical back: Normal range of motion.  Skin:    General: Skin is warm and dry.     Findings: No erythema.  Neurological:     Mental Status: He is alert and oriented to person, place, and time. Mental status is at baseline.  Psychiatric:        Mood and Affect: Mood normal.        Behavior: Behavior normal.        Thought Content: Thought content normal.        Judgment: Judgment normal.           07/01/2023    2:24 PM 07/01/2023    9:55 AM 06/03/2023    9:52 AM 05/19/2023   10:43 AM 04/01/2023   11:07 AM  Depression screen PHQ 2/9  Decreased Interest 0 1 2 1  0  Down, Depressed, Hopeless 0 0 2 1 0  PHQ - 2 Score 0 1 4 2  0  Altered sleeping  2  0  Tired, decreased energy  0   0  Change in appetite  0   0  Feeling bad or failure about yourself   2   0  Trouble concentrating  0   0  Moving slowly or fidgety/restless  2   0  Suicidal thoughts  0   0  PHQ-9 Score  7   0  Difficult doing work/chores  Somewhat difficult   Not difficult at all      12/31/2022   10:09 AM 07/21/2022   12:17 PM 04/17/2022    4:07 PM  GAD 7 : Generalized Anxiety Score  Nervous, Anxious, on Edge 0 0 0  Control/stop worrying 0 0 0  Worry too much - different things 0 0 0  Trouble relaxing 0 0 0  Restless 0 0 0  Easily annoyed or irritable 0 0 0  Afraid - awful might happen 0 0 0  Total GAD 7 Score 0 0 0  Anxiety Difficulty Not difficult at all Not difficult at all Not difficult at all    ASSESSMENT/PLAN:  Ingrown toenail Assessment & Plan: Persistent toe pain managed with Tylenol , pregabalin , Aleve, and clove oil. Ingrown toenails treated with debridement and silver nitrate.  No fever or  worsening discharge. He is followed by Podiatry.  No indication for antibiotics at this time. - Order CBC to check for signs of infection. - Order bilateral foot x-rays to assess for possible osteomyelitis. - Continue current wound care regimen with Silver Cell cream and Band-Aids. -Recommend he follow up with Podiatrist and PCP  Orders: -     DG Foot 2 Views Left; Future -     DG Foot 2 Views Right; Future -     CBC with Differential/Platelet; Future -     Comprehensive metabolic panel with GFR; Future    PDMP reviewed  Return if symptoms worsen or fail to improve, for PCP.  Valli Gaw, MD

## 2023-07-20 NOTE — Progress Notes (Signed)
 Pt presented for their vitamin B12 injection. Pt was identified through two identifiers. Pt tolerated shot well in their left deltoid.  During the visit pt explained to me he had a fall last week and showed me a big bruise that was on his  left side he stated he felt fine.   I informed him he should schedule an appt with either Kacy or another provider to be checked out/. Pt stated he felt fine and he has had broken ribs before and that this did not feel like that. He also expressed he feels he needs an antibiotic for his toe nail, and I informed him that if he did not want to be checked out for his fall he should definitely schedule an appt if he is wanting antibiotics he verbalized understanding and stated he would schedule an appt at check out.

## 2023-07-21 ENCOUNTER — Other Ambulatory Visit (INDEPENDENT_AMBULATORY_CARE_PROVIDER_SITE_OTHER)

## 2023-07-21 ENCOUNTER — Ambulatory Visit

## 2023-07-21 DIAGNOSIS — L089 Local infection of the skin and subcutaneous tissue, unspecified: Secondary | ICD-10-CM

## 2023-07-21 NOTE — Telephone Encounter (Signed)
 Medication was refilled yesterday. Is it okay to refuse refill request?

## 2023-07-21 NOTE — Telephone Encounter (Signed)
 Noted.

## 2023-07-22 LAB — CBC WITH DIFFERENTIAL/PLATELET
Basophils Absolute: 0.1 10*3/uL (ref 0.0–0.1)
Basophils Relative: 1 % (ref 0.0–3.0)
Eosinophils Absolute: 0.4 10*3/uL (ref 0.0–0.7)
Eosinophils Relative: 4.6 % (ref 0.0–5.0)
HCT: 41.8 % (ref 39.0–52.0)
Hemoglobin: 13.4 g/dL (ref 13.0–17.0)
Lymphocytes Relative: 23.2 % (ref 12.0–46.0)
Lymphs Abs: 2 10*3/uL (ref 0.7–4.0)
MCHC: 32.1 g/dL (ref 30.0–36.0)
MCV: 82.2 fl (ref 78.0–100.0)
Monocytes Absolute: 0.7 10*3/uL (ref 0.1–1.0)
Monocytes Relative: 7.7 % (ref 3.0–12.0)
Neutro Abs: 5.4 10*3/uL (ref 1.4–7.7)
Neutrophils Relative %: 63.5 % (ref 43.0–77.0)
Platelets: 217 10*3/uL (ref 150.0–400.0)
RBC: 5.08 Mil/uL (ref 4.22–5.81)
RDW: 22.2 % — ABNORMAL HIGH (ref 11.5–15.5)
WBC: 8.6 10*3/uL (ref 4.0–10.5)

## 2023-07-22 LAB — COMPREHENSIVE METABOLIC PANEL WITH GFR
ALT: 17 U/L (ref 0–53)
AST: 20 U/L (ref 0–37)
Albumin: 4.1 g/dL (ref 3.5–5.2)
Alkaline Phosphatase: 67 U/L (ref 39–117)
BUN: 15 mg/dL (ref 6–23)
CO2: 32 meq/L (ref 19–32)
Calcium: 9 mg/dL (ref 8.4–10.5)
Chloride: 99 meq/L (ref 96–112)
Creatinine, Ser: 1.42 mg/dL (ref 0.40–1.50)
GFR: 47.45 mL/min — ABNORMAL LOW (ref >60.00)
Glucose, Bld: 88 mg/dL (ref 70–99)
Potassium: 4.2 meq/L (ref 3.5–5.1)
Sodium: 138 meq/L (ref 135–145)
Total Bilirubin: 0.8 mg/dL (ref 0.2–1.2)
Total Protein: 6.7 g/dL (ref 6.0–8.3)

## 2023-07-23 ENCOUNTER — Telehealth: Payer: Self-pay

## 2023-07-23 NOTE — Telephone Encounter (Signed)
 Copied from CRM [id number]. Topic: General - Other >> Jul 23, 2023  3:30 PM Aletta Edouard wrote: Reason for CRM: patient is needing a call back to go over his lab results

## 2023-07-29 ENCOUNTER — Encounter: Payer: Self-pay | Admitting: Gastroenterology

## 2023-07-29 ENCOUNTER — Ambulatory Visit: Payer: PPO | Admitting: Gastroenterology

## 2023-07-29 VITALS — BP 130/76 | HR 83 | Ht 71.0 in | Wt 260.0 lb

## 2023-07-29 DIAGNOSIS — E611 Iron deficiency: Secondary | ICD-10-CM

## 2023-07-29 MED ORDER — NA SULFATE-K SULFATE-MG SULF 17.5-3.13-1.6 GM/177ML PO SOLN: 1.0000 | Freq: Once | ORAL | 0 refills | Status: AC

## 2023-07-29 NOTE — Patient Instructions (Signed)
 You have been scheduled for an endoscopy and colonoscopy. Please follow the written instructions given to you at your visit today.  If you use inhalers (even only as needed), please bring them with you on the day of your procedure.  DO NOT TAKE 7 DAYS PRIOR TO TEST- Trulicity (dulaglutide) Ozempic, Wegovy (semaglutide) Mounjaro (tirzepatide) Bydureon Bcise (exanatide extended release)  DO NOT TAKE 1 DAY PRIOR TO YOUR TEST Rybelsus (semaglutide) Adlyxin (lixisenatide) Victoza (liraglutide) Byetta (exanatide) ____________________________________________________________________  _______________________________________________________  If your blood pressure at your visit was 140/90 or greater, please contact your primary care physician to follow up on this.  _______________________________________________________  If you are age 2 or older, your body mass index should be between 23-30. Your Body mass index is 36.26 kg/m. If this is out of the aforementioned range listed, please consider follow up with your Primary Care Provider.  If you are age 25 or younger, your body mass index should be between 19-25. Your Body mass index is 36.26 kg/m. If this is out of the aformentioned range listed, please consider follow up with your Primary Care Provider.   ________________________________________________________  The [place] [facility] providers would like to encourage you to use MYCHART to communicate with providers for non-urgent requests or questions.  Due to long hold times on the telephone, sending your provider a message by [facility] may be a faster and more efficient way to get a response.  Please allow 48 business hours for a response.  Please remember that this is for non-urgent requests.  _______________________________________________________

## 2023-07-29 NOTE — Progress Notes (Addendum)
 07/29/2023 Douglas [name] [id number] 03/11/1946   HISTORY OF PRESENT ILLNESS: This is a 78 year old male with past medical history as listed below.  He is here today at the request of Dr. Birdie Sons for evaluation of iron deficiency.  Iron levels low for a while, but hemoglobin is normal at 13.4 grams at this point.  Was slightly low in the 12 g range previously.  They had recommended iron supplements orally in the past, but he never took them.  He just started oral iron supplements about a month ago.  Stool is dark now because of the oral iron, but no otherwise previous to that no black stools and no bloody stools.  His only GI complaint is of a lot of gas.  Of note, I updated his records, he tells me that he has not used any oxygen at all in the past 2 to 3 years.  Colonoscopy May 2017 showed only diverticulosis and decreased sphincter tone.  Past Medical History:  Diagnosis Date   Arthritis    Depression    Diabetes mellitus with complication (HCC)    Diabetic neuropathy (HCC)    Diastolic dysfunction    a. TTE 07/2017: EF 60-65%, mild concentric LVH, no RWMA, Gr1DD, trivial AI, mildly dilated LA, RVSF normal, PASP normal   Edema    feet/legs   GERD (gastroesophageal reflux disease)    Gout    History of stress test    a. MV 06/2017: small in size, mild in severity, apical anterior and apical defect that was minimally reversible and most likely represented artifact and less likely ischemia/scar, LVEF 55-65%, low risk, probably normal stress test   Hypertension    Hypothyroidism    Kidney cysts    renal failure 2013   Kidney stones    Dr. Artis Flock   OSA (obstructive sleep apnea)    No oxygen at all in 2-3 years.   [place] spotted fever 12/10/2017   Positve IgG in titer on 11/20/17   S/P cardiac cath 1998   a. no obstructive disease   Syncope and collapse    Temporary low platelet count (HCC)    Past Surgical History:  Procedure Laterality Date   ANAL FISSURE  REPAIR     BACK SURGERY  1977   rupture disc lumbar spine   CARDIAC CATHETERIZATION  1998   [facility].    CATARACT EXTRACTION W/PHACO Right 07/16/2016   Procedure: CATARACT EXTRACTION PHACO AND INTRAOCULAR LENS PLACEMENT (IOC);  Surgeon: Sallee Lange, MD;  Location: [facility] ORS;  Service: Ophthalmology;  Laterality: Right;  [place] 01:11 AP% 17.6 CDE 24.83 fluid pack lot # [id number]   CATARACT EXTRACTION W/PHACO Left 08/13/2016   Procedure: CATARACT EXTRACTION PHACO AND INTRAOCULAR LENS PLACEMENT (IOC) suture placed in left eye at end of procedure;  Surgeon: Sallee Lange, MD;  Location: [facility] ORS;  Service: Ophthalmology;  Laterality: Left;  [place] 01:55 AP% 22.7 CDE 53.56 fluid pack lot # [id number]   FINGER AMPUTATION     partial   FINGER ARTHROPLASTY     KNEE SURGERY  1993   arthroscopy   SHOULDER SURGERY Bilateral 1998   arthroscopic right, rotator cuff repair left    reports that he has never smoked. He has never used smokeless tobacco. He reports that he does not currently use alcohol. He reports that he does not use drugs. family history includes AAA (abdominal aortic aneurysm) in his mother; Bone cancer in his mother; Breast cancer  in his mother; Cancer in his brother and mother; Heart Problems in his mother; Heart attack in his brother and father; Heart disease in his father and mother; Lung cancer in his mother. Allergies  Allergen Reactions   Bee Venom Swelling   Omeprazole Other (See Comments)    Other reaction(s): increased sx on high dose   Benadryl [Diphenhydramine Hcl (Sleep)] Other (See Comments)    Hyperactivity    Enalapril Itching    itching   Gabapentin Palpitations and Rash    rash   Hctz [Hydrochlorothiazide] Other (See Comments)    Decreased potassium      Outpatient Encounter Medications as of 07/29/2023  Medication Sig   acetaminophen (TYLENOL) 500 MG tablet Take 500 mg by mouth every 6 (six) hours as needed.    aspirin 81 MG tablet Take 1 tablet (81 mg total) by mouth daily. (Patient taking differently: Take 325 mg by mouth daily.)   B Complex Vitamins (BL VITAMIN B COMPLEX PO) Take by mouth daily.   calcitRIOL (ROCALTROL) 0.25 MCG capsule Take 1 capsule (0.25 mcg total) by mouth daily.   carvedilol (COREG) 6.25 MG tablet TAKE 1 TABLET(6.25 MG) BY MOUTH TWICE DAILY WITH A MEAL   Cholecalciferol (VITAMIN D3 PO) Take by mouth daily.   clove oil liquid Apply 1 application topically as needed.   cyclobenzaprine (FLEXERIL) 10 MG tablet Take 1 tablet by mouth as needed.   dapagliflozin propanediol ([name]) 10 MG TABS tablet Take 1 tablet (10 mg total) by mouth daily.   doxazosin (CARDURA) 4 MG tablet Take 1 tablet (4 mg total) by mouth daily.   DULoxetine (CYMBALTA) 60 MG capsule TAKE 1 CAPSULE(60 MG) BY MOUTH DAILY   esomeprazole (NEXIUM) 40 MG capsule Take 1 capsule (40 mg total) by mouth daily at 12 noon.   finasteride (PROSCAR) 5 MG tablet TAKE 1 TABLET(5 MG) BY MOUTH AT BEDTIME (Patient taking differently: 5 mg at bedtime as needed. TAKE 1 TABLET(5 MG) BY MOUTH AT BEDTIME)   furosemide (LASIX) 40 MG tablet TAKE 1 TABLET(40 MG) BY MOUTH DAILY   gentamicin cream (GARAMYCIN) 0.1 % Apply 1 application. topically 2 (two) times daily.   insulin degludec ([name] FLEXTOUCH) 200 UNIT/ML FlexTouch Pen Inject 40 Units into the skin daily.   Insulin Pen Needle (NOVOFINE PEN NEEDLE) 32G X 6 MM MISC 1 each by Does not apply route 2 (two) times daily.   levothyroxine (SYNTHROID) 100 MCG tablet TAKE 1 TABLET(100 MCG) BY MOUTH DAILY BEFORE BREAKFAST   loratadine (CLARITIN) 10 MG tablet Take 10 mg by mouth daily as needed for allergies.   losartan (COZAAR) 25 MG tablet TAKE 1 TABLET BY MOUTH EVERY MORNING THEN TAKE 2 TABLETS BY MOUTH EVERY EVENING   MAGNESIUM PO Take 400 mg by mouth daily at 8 pm. 1 tab in the a.m. and 2 at bedtime   mometasone (NASONEX) 50 MCG/ACT nasal spray Place 2 sprays into the nose daily.    Multiple Vitamins-Minerals (MULTIVITAMIN MEN PO) Take 1 tablet by mouth daily.   Na Sulfate-K Sulfate-Mg Sulfate concentrate (SUPREP) 17.5-3.13-1.6 GM/177ML SOLN Take 1 kit (354 mLs total) by mouth once for 1 dose.   Needle, Disp, (HYPODERMIC NEEDLE 18GX1") 18G X 1" MISC Use every 14 days to draw up testosterone   Needle, Disp, (HYPODERMIC NEEDLE 25GX1") 25G X 1" MISC Use every 14 days to administer testosterone   pravastatin (PRAVACHOL) 20 MG tablet Take 1 tablet (20 mg total) by mouth daily.   pregabalin (LYRICA) 200 MG capsule  TAKE 1 CAPSULE(200 MG) BY MOUTH TWICE DAILY   Semaglutide, 1 MG/DOSE, (OZEMPIC, 1 MG/DOSE,) 4 MG/3ML SOPN Inject 1 mg into the skin once a week.   tadalafil (CIALIS) 20 MG tablet Take by mouth.   testosterone cypionate (DEPOTESTOSTERONE CYPIONATE) 200 MG/ML injection Inject 200 mg into the muscle every 14 (fourteen) days.   gabapentin (NEURONTIN) 100 MG capsule Take 100 mg by mouth 3 (three) times daily. Takes 200 mg in the morning and 200 mg at bedtime (Patient not taking: Reported on 07/29/2023)   No facility-administered encounter medications on file as of 07/29/2023.    REVIEW OF SYSTEMS  : All other systems reviewed and negative except where noted in the History of Present Illness.   PHYSICAL EXAM: BP 130/76   Pulse 83   Ht 5\' 11"  (1.803 m)   Wt 260 lb (117.9 kg)   BMI 36.26 kg/m  General: Well developed white male in no acute distress Head: Normocephalic and atraumatic Eyes:  Sclerae anicteric, conjunctiva pink. Ears: Normal auditory acuity Lungs: Clear throughout to auscultation; no W/R/R. Heart: Regular rate and rhythm; no M/R/G. Rectal:  Will be done at the time of colonoscopy. Musculoskeletal: Symmetrical with no gross deformities  Neurological: Alert oriented x 4, grossly non-focal Psychological:  Alert and cooperative. Normal mood and affect  ASSESSMENT AND PLAN: *Iron deficiency: Iron levels low for a while, but hemoglobin is normal at this  point.  Was slightly low in the 12 g range previously.  They had recommended iron supplements orally in the past, but he never took them.  He just started oral iron supplements about a month ago.  Stool is dark now because of the oral iron, but no otherwise previous to that no black stools and no bloody stools.  Last colonoscopy May 2017.  No endoscopy in his records.  Will plan for both EGD and colonoscopy with Dr. Dominic Friendly.  The risks, benefits, and alternatives to EGD and colonoscopy were discussed with the patient and he consents to proceed.  *Vitamin B12 deficiency: Takes monthly vitamin B12 injections through his PCP.  CC:  Douglas Pear, MD

## 2023-07-30 NOTE — Progress Notes (Signed)
 ____________________________________________________________  Attending physician addendum:  Thank you for sending this case to me. I have reviewed the entire note and agree with the plan.  I also reviewed his Jan 2025 cardiology clinic note indicating stability of his cardiac condition.  Lorella Roles, MD  ____________________________________________________________

## 2023-07-31 ENCOUNTER — Other Ambulatory Visit: Payer: Self-pay | Admitting: Family Medicine

## 2023-08-02 ENCOUNTER — Encounter: Payer: Self-pay | Admitting: Family Medicine

## 2023-08-02 NOTE — Assessment & Plan Note (Signed)
 Persistent toe pain managed with Tylenol , pregabalin , Aleve, and clove oil. Ingrown toenails treated with debridement and silver nitrate.  No fever or worsening discharge. He is followed by Podiatry.  No indication for antibiotics at this time. - Order CBC to check for signs of infection. - Order bilateral foot x-rays to assess for possible osteomyelitis. - Continue current wound care regimen with Silver Cell cream and Band-Aids. -Recommend he follow up with Podiatrist and PCP

## 2023-08-04 ENCOUNTER — Other Ambulatory Visit: Payer: Self-pay

## 2023-08-04 MED ORDER — LOSARTAN POTASSIUM 25 MG PO TABS: ORAL_TABLET | ORAL | 0 refills | Status: DC

## 2023-08-05 ENCOUNTER — Telehealth: Payer: Self-pay

## 2023-08-05 NOTE — Telephone Encounter (Signed)
 Called and notified pt's wife Virginia  that [name]  was here at the office and ready for pick up.  3 boxes [id number] exp: 10/11/25

## 2023-08-11 ENCOUNTER — Telehealth: Payer: Self-pay

## 2023-08-11 NOTE — Telephone Encounter (Signed)
Pt has picked up medication.  

## 2023-08-11 NOTE — Telephone Encounter (Signed)
 Pt informed of pt assistance meds ready for pick up    Med: Ozempic   QTY: 4 boxes  LOT: [id number] EXP: 02-11-26

## 2023-08-14 ENCOUNTER — Other Ambulatory Visit: Payer: Self-pay | Admitting: Family Medicine

## 2023-08-14 NOTE — Telephone Encounter (Signed)
 Refilled: 07/20/2023 Last OV: 07/01/2023 Next OV: 10/01/2023  Pt is aware that provider is out of office and has enough to get through till she returns. Pt would like this to be sent in as a 90 day supply.

## 2023-08-19 ENCOUNTER — Telehealth: Payer: Self-pay

## 2023-08-19 NOTE — Telephone Encounter (Signed)
 Left message for Patient that we received 2 boxes of pen needles from patient assistance and that the pen needles are ready for pick up.

## 2023-08-21 ENCOUNTER — Other Ambulatory Visit: Payer: Self-pay

## 2023-08-21 DIAGNOSIS — E039 Hypothyroidism, unspecified: Secondary | ICD-10-CM

## 2023-08-21 MED ORDER — LEVOTHYROXINE SODIUM 100 MCG PO TABS: ORAL_TABLET | ORAL | 3 refills | Status: AC

## 2023-08-24 ENCOUNTER — Encounter: Payer: Self-pay | Admitting: Internal Medicine

## 2023-08-24 ENCOUNTER — Ambulatory Visit (INDEPENDENT_AMBULATORY_CARE_PROVIDER_SITE_OTHER)

## 2023-08-24 ENCOUNTER — Ambulatory Visit (INDEPENDENT_AMBULATORY_CARE_PROVIDER_SITE_OTHER): Admitting: Internal Medicine

## 2023-08-24 VITALS — BP 144/94 | HR 85 | Temp 97.9°F | Ht 71.0 in | Wt 264.8 lb

## 2023-08-24 DIAGNOSIS — M1A3790 Chronic gout due to renal impairment, unspecified ankle and foot, without tophus (tophi): Secondary | ICD-10-CM | POA: Diagnosis not present

## 2023-08-24 DIAGNOSIS — E538 Deficiency of other specified B group vitamins: Secondary | ICD-10-CM

## 2023-08-24 DIAGNOSIS — L03039 Cellulitis of unspecified toe: Secondary | ICD-10-CM

## 2023-08-24 DIAGNOSIS — I10 Essential (primary) hypertension: Secondary | ICD-10-CM

## 2023-08-24 DIAGNOSIS — L0390 Cellulitis, unspecified: Secondary | ICD-10-CM | POA: Insufficient documentation

## 2023-08-24 MED ORDER — CYANOCOBALAMIN 1000 MCG/ML IJ SOLN
1000.0000 ug | Freq: Once | INTRAMUSCULAR | Status: AC
  Administered 2023-08-24: 1000 ug via INTRAMUSCULAR

## 2023-08-24 MED ORDER — DOXYCYCLINE HYCLATE 100 MG PO TABS: 100.0000 mg | ORAL_TABLET | Freq: Two times a day (BID) | ORAL | 0 refills | Status: DC

## 2023-08-24 MED ORDER — COLCHICINE 0.6 MG PO TABS: ORAL_TABLET | ORAL | 0 refills | Status: DC

## 2023-08-24 NOTE — Assessment & Plan Note (Signed)
-   Patient presented today with bilateral redness, swelling and tenderness over his great toes -Patient did have his great toe toenails removed recently secondary to recurrent ingrown toenails -On exam today, patient has redness, swelling, increased local warmth and mild tenderness to palpation over both toes -I suspect he likely has cellulitis in these toes with a possible component of gout as well (but this is less likely) -Will start the patient on doxycycline  to see if this improves his symptoms -Patient to follow-up with his podiatrist as well -If his symptoms do not improve in the next 2 to 3 days he will contact us  for further evaluation

## 2023-08-24 NOTE — Telephone Encounter (Signed)
 Pt picked up pen needles during nurse visit

## 2023-08-24 NOTE — Progress Notes (Signed)
 Pt presented for their vitamin B12 injection. Pt was identified through two identifiers. Pt tolerated shot well in their right deltoid...   While giving pt his b12 injection pt stated that his left big toe was bothering him pt stated that he had a fall a few weeks back and that ever since his foot has been bothering him. Pt stated he was seen by Dr. Sueanne Emerald and podiatrist. Pt's left  big toe was swollen and red. Scheduled pt to be seen today with DR. Narendra at 2:40pm

## 2023-08-24 NOTE — Progress Notes (Signed)
 Acute Office Visit  Subjective:     Patient ID: Douglas [name], male    DOB: 18-Dec-1945, 78 y.o.   MRN: [id number]  Chief Complaint  Patient presents with   Acute Visit    Bilateral toe swelling & pain Both ankles swell before night    HPI Patient is in today for bilateral great toe swelling/redness and pain with left greater than right.  Patient states that over the last couple days he has noted pain/swelling and redness over his bilateral great toes greater in the left than the right.  Patient states that he was on allopurinol  for gout prophylaxis but stopped this medication last year.  Patient also states that he had bilateral great toe toenails removed secondary to recurrent ingrown toenails.  Patient states that he is able to walk and that this actually improves his pain.  He denies any fevers or chills.  Review of Systems  Constitutional: Negative.   HENT: Negative.    Respiratory: Negative.    Cardiovascular: Negative.   Gastrointestinal: Negative.   Musculoskeletal:  Negative for myalgias.       Bilateral great toe pain/swelling/redness  Neurological: Negative.   Psychiatric/Behavioral: Negative.          Objective:    BP (!) 144/94   Pulse 85   Temp 97.9 F (36.6 C)   Ht 5\' 11"  (1.803 m)   Wt 264 lb 12.8 oz (120.1 kg)   SpO2 96%   BMI 36.93 kg/m    Physical Exam Constitutional:      Appearance: Normal appearance.  HENT:     Head: Normocephalic and atraumatic.  Cardiovascular:     Rate and Rhythm: Normal rate and regular rhythm.  Pulmonary:     Effort: Pulmonary effort is normal.     Breath sounds: Normal breath sounds. No wheezing, rhonchi or rales.  Musculoskeletal:        General: Swelling and tenderness present.     Comments: Patient noted to have bilateral great toe redness/swelling as well as mild tenderness to palpation over both toes.  Neurological:     Mental Status: He is alert and oriented to person, place, and time.  Psychiatric:         Mood and Affect: Mood normal.        No results found for any visits on 08/24/23.      Assessment & Plan:   Problem List Items Addressed This Visit       Cardiovascular and Mediastinum   Essential hypertension (Chronic)   - Patient did have an elevated blood pressure today of 144/94 -Blood pressure was also elevated on his last visit here with his PCP -Patient is on losartan ,Cardura  and Coreg  currently -Given that he is in pain today I suspect that his blood pressure may be slightly elevated secondary to this -Would hold off on any changes to his blood pressure regimen currently -If his blood pressure remains elevated at his next visit would consider modifying his regimen        Other   Gout (Chronic)   - Patient has a history of gout and stopped his allopurinol  last year secondary to try to get off some of his medications -His uric acid level done earlier this year was 7.3 (goal uric acid level for patients with gout is less than 6) -Patient presented today with swelling, redness and mild tenderness to palpation over both great toes -Given that his tenderness is only mild this is less likely an  acute gout flare -However, we will treat him with colchicine  (0.6 mg x 1 followed by another dose 3 days later).  Dose was adjusted as patient is on carvedilol  and combination may increase colchicine  levels. -Patient to follow-up with his podiatrist as well -Can consider resuming allopurinol  on follow-up -No further workup at this time      Relevant Medications   colchicine  0.6 MG tablet   Cellulitis - Primary   - Patient presented today with bilateral redness, swelling and tenderness over his great toes -Patient did have his great toe toenails removed recently secondary to recurrent ingrown toenails -On exam today, patient has redness, swelling, increased local warmth and mild tenderness to palpation over both toes -I suspect he likely has cellulitis in these toes with a  possible component of gout as well (but this is less likely) -Will start the patient on doxycycline  to see if this improves his symptoms -Patient to follow-up with his podiatrist as well -If his symptoms do not improve in the next 2 to 3 days he will contact us  for further evaluation      Relevant Medications   doxycycline  (VIBRA -TABS) 100 MG tablet    Meds ordered this encounter  Medications   doxycycline  (VIBRA -TABS) 100 MG tablet    Sig: Take 1 tablet (100 mg total) by mouth 2 (two) times daily.    Dispense:  14 tablet    Refill:  0   colchicine  0.6 MG tablet    Sig: Take 1 tablet once followed by another tablet 3 days later    Dispense:  3 tablet    Refill:  0    No follow-ups on file.  Eurydice Calixto, MD

## 2023-08-24 NOTE — Assessment & Plan Note (Signed)
-   Patient has a history of gout and stopped his allopurinol  last year secondary to try to get off some of his medications -His uric acid level done earlier this year was 7.3 (goal uric acid level for patients with gout is less than 6) -Patient presented today with swelling, redness and mild tenderness to palpation over both great toes -Given that his tenderness is only mild this is less likely an acute gout flare -However, we will treat him with colchicine  (0.6 mg x 1 followed by another dose 3 days later).  Dose was adjusted as patient is on carvedilol  and combination may increase colchicine  levels. -Patient to follow-up with his podiatrist as well -Can consider resuming allopurinol  on follow-up -No further workup at this time

## 2023-08-24 NOTE — Patient Instructions (Signed)
-   Was a pleasure meeting you today -I suspect that you likely have an infection in your toes.  Will call in an antibiotic to complete a 7-day course (doxycycline ) -Please follow-up with your podiatrist for further evaluation as well -If your symptoms are worsening despite treatment please contact us  and let us  know -Given that you do have a history of gout and there may be component of gout to this I have prescribed colchicine .  Take 1 tablet today followed by another tablet 3 days later -Your blood pressure is mildly elevated today likely secondary to pain in your toes.  Will continue with current medications for now.  Your blood pressure remains elevated we may need to adjust your blood pressure regimen -Please contact us  with any questions or concerns or if your symptoms do not improve or worsen

## 2023-08-24 NOTE — Assessment & Plan Note (Signed)
-   Patient did have an elevated blood pressure today of 144/94 -Blood pressure was also elevated on his last visit here with his PCP -Patient is on losartan ,Cardura  and Coreg  currently -Given that he is in pain today I suspect that his blood pressure may be slightly elevated secondary to this -Would hold off on any changes to his blood pressure regimen currently -If his blood pressure remains elevated at his next visit would consider modifying his regimen

## 2023-08-31 ENCOUNTER — Encounter: Payer: Self-pay | Admitting: Student in an Organized Health Care Education/Training Program

## 2023-08-31 ENCOUNTER — Ambulatory Visit
Attending: Student in an Organized Health Care Education/Training Program | Admitting: Student in an Organized Health Care Education/Training Program

## 2023-08-31 VITALS — BP 153/88 | HR 80 | Temp 97.2°F | Resp 16 | Ht 71.5 in | Wt 265.0 lb

## 2023-08-31 DIAGNOSIS — M48062 Spinal stenosis, lumbar region with neurogenic claudication: Secondary | ICD-10-CM | POA: Diagnosis present

## 2023-08-31 DIAGNOSIS — M4726 Other spondylosis with radiculopathy, lumbar region: Secondary | ICD-10-CM | POA: Diagnosis not present

## 2023-08-31 DIAGNOSIS — Z794 Long term (current) use of insulin: Secondary | ICD-10-CM

## 2023-08-31 DIAGNOSIS — M47816 Spondylosis without myelopathy or radiculopathy, lumbar region: Secondary | ICD-10-CM

## 2023-08-31 DIAGNOSIS — E1140 Type 2 diabetes mellitus with diabetic neuropathy, unspecified: Secondary | ICD-10-CM

## 2023-08-31 DIAGNOSIS — G8929 Other chronic pain: Secondary | ICD-10-CM | POA: Diagnosis present

## 2023-08-31 DIAGNOSIS — M5416 Radiculopathy, lumbar region: Secondary | ICD-10-CM | POA: Insufficient documentation

## 2023-08-31 NOTE — Progress Notes (Signed)
 Safety precautions to be maintained throughout the outpatient stay will include: orient to surroundings, keep bed in low position, maintain call bell within reach at all times, provide assistance with transfer out of bed and ambulation.

## 2023-08-31 NOTE — Progress Notes (Signed)
 PROVIDER NOTE: Interpretation of information contained herein should be left to medically-trained personnel. Specific patient instructions are provided elsewhere under "Patient Instructions" section of medical record. This document was created in part using AI and STT-dictation technology, any transcriptional errors that may result from this process are unintentional.  Patient: Douglas [name]  Service: E/M   PCP: [name], NP  DOB: [birth year]  DOS: 08/31/2023  Provider: Cephus Collin, MD  MRN: [id number]  Delivery: Face-to-face  Specialty: Interventional Pain Management  Type: Established Patient  Setting: Ambulatory outpatient facility  Specialty designation: 09  Referring Prov.: [name], NP  Location: Outpatient office facility       HPI  [name], a 78 y.o. year old male, is here today because of his Spinal stenosis, lumbar region, with neurogenic claudication [M48.062]. [name] primary complain today is Back Pain and Foot Pain (bilat)  Pain Assessment: Severity of Chronic pain is reported as a 0-No pain/10. Location: Back Lower/denies. Onset: More than a month ago. Quality: Aching. Timing:  . Modifying factor(s): procedure, meds. Vitals:  height is 5' 11.5" (1.816 m) and weight is 265 lb (120.2 kg). His temperature is 97.2 F (36.2 C) (abnormal). His blood pressure is 153/88 (abnormal) and his pulse is 80. His respiration is 16 and oxygen saturation is 97%.  BMI: Estimated body mass index is 36.44 kg/m as calculated from the following:   Height as of this encounter: 5' 11.5" (1.816 m).   Weight as of this encounter: 265 lb (120.2 kg). Last encounter: 07/01/2023. Last procedure: 06/03/2023.  Reason for encounter:   Fortunately, patient did not get great benefit with his first Qutenza  treatment and does not want to repeat.  He is continuing to endorse benefit from his lumbar epidural steroid injection and states that his left leg pain is still significantly  better than it was before his epidural injection.  We will continue to monitor that.  He has also had a history of rotator cuff surgery we discussed options for aggravation of his shoulder pain including suprascapular and axillary nerve block.  He also has bilateral knee pain related to bilateral knee osteoarthritis.  We have discussed bilateral genicular nerve block  ROS  Constitutional: Denies any fever or chills Gastrointestinal: No reported hemesis, hematochezia, vomiting, or acute GI distress Musculoskeletal: Shoulder pain Neurological: Paresthesias bilateral feet  Medication Review  B Complex Vitamins, Cholecalciferol, DULoxetine , HYPODERMIC NEEDLE 18GX1", HYPODERMIC NEEDLE 25GX1", Insulin  Pen Needle, Magnesium , Multiple Vitamins-Minerals, Semaglutide  (1 MG/DOSE), acetaminophen , aspirin , calcitRIOL , carvedilol , clove oil, colchicine , cyclobenzaprine, dapagliflozin  propanediol, doxazosin , doxycycline , esomeprazole , finasteride , furosemide , gentamicin  cream, insulin  degludec, levothyroxine , loratadine , losartan , mometasone , pravastatin , pregabalin , tadalafil, and testosterone  cypionate  History Review  Allergy: [name] is allergic to bee venom, omeprazole, benadryl [diphenhydramine hcl (sleep)], enalapril, gabapentin, and hctz [hydrochlorothiazide]. Drug: [name]  reports no history of drug use. Alcohol:  reports that he does not currently use alcohol. Tobacco:  reports that he has never smoked. He has never used smokeless tobacco. Social: [name]  reports that he has never smoked. He has never used smokeless tobacco. He reports that he does not currently use alcohol. He reports that he does not use drugs. Medical:  has a past medical history of Arthritis, Depression, Diabetes mellitus with complication (HCC), Diabetic neuropathy (HCC), Diastolic dysfunction, Edema, GERD (gastroesophageal reflux disease), Gout, History of stress test, Hypertension, Hypothyroidism, Kidney cysts,  Kidney stones, OSA (obstructive sleep apnea), [facility] spotted fever (12/10/2017), S/P cardiac cath (1998), Syncope and collapse, and Temporary  low platelet count (HCC). Surgical: [name]  has a past surgical history that includes Knee surgery (1993); Back surgery (1977); Shoulder surgery (Bilateral, 1998); Anal fissure repair; Finger amputation; Cataract extraction w/PHACO (Right, 07/16/2016); Cataract extraction w/PHACO (Left, 08/13/2016); Cardiac catheterization (1998); and Finger arthroplasty. Family: family history includes AAA (abdominal aortic aneurysm) in his mother; Bone cancer in his mother; Breast cancer in his mother; Cancer in his brother and mother; Heart Problems in his mother; Heart attack in his brother and father; Heart disease in his father and mother; Lung cancer in his mother.  Laboratory Chemistry Profile   Renal Lab Results  Component Value Date   BUN 15 07/21/2023   CREATININE 1.42 07/21/2023   BCR 8 (L) 11/12/2018   GFR 47.45 (L) 07/21/2023   GFRAA 53 (L) 01/19/2019   GFRNONAA 46 (L) 01/19/2019    Hepatic Lab Results  Component Value Date   AST 20 07/21/2023   ALT 17 07/21/2023   ALBUMIN 4.1 07/21/2023   ALKPHOS 67 07/21/2023   HCVAB <0.1 06/07/2015   LIPASE 388 03/23/2012    Electrolytes Lab Results  Component Value Date   NA 138 07/21/2023   K 4.2 07/21/2023   CL 99 07/21/2023   CALCIUM 9.0 07/21/2023    Bone Lab Results  Component Value Date   VD25OH 83 05/02/2013   TESTOSTERONE  199.59 (L) 11/23/2019    Inflammation (CRP: Acute Phase) (ESR: Chronic Phase) No results found for: "CRP", "ESRSEDRATE", "LATICACIDVEN"       Note: Above Lab results reviewed.  Recent Imaging Review  DG Foot 2 Views Right CLINICAL DATA:  Right toe infection.  EXAM: RIGHT FOOT - 2 VIEW  COMPARISON:  Right foot radiographs 01/24/2022  FINDINGS: Moderate hallux valgus. Mild lateral great toe metatarsophalangeal joint space narrowing and peripheral  osteophytosis. Mild-to-moderate plantar and posterior calcaneal heel spurs. Moderate dorsal talonavicular degenerative osteophytosis. Mild-to-moderate great toe metatarsal head-plantar sesamoid joint space narrowing. No acute fracture is seen. No dislocation. No definite cortical erosion.  Mild diffuse forefoot soft tissue swelling. No definite soft tissue ulcer is identified.  IMPRESSION: 1. Moderate hallux valgus. 2. Mild great toe metatarsophalangeal joint osteoarthritis. 3. Mild-to-moderate plantar and posterior calcaneal heel spurs.  Electronically Signed   By: Bertina Broccoli M.D.   On: 07/25/2023 13:23 DG Foot 2 Views Left CLINICAL DATA:  Left foot infection.  EXAM: LEFT FOOT - 2 VIEW  COMPARISON:  Left foot radiographs 01/24/2022 and 08/12/2021 fall  FINDINGS: Mild-to-moderate plantar and mild posterior calcaneal heel spurs. Moderate dorsal talonavicular degenerative osteophytosis. Mild soft tissue swelling of the great toe appears to be decreased from 01/24/2022. There is a 3 mm well-circumscribed lucency at the distal medial aspect of the proximal phalanx of the great toe, nonspecific. The overlying cortex appears intact and this may represent degenerative change.  There is unchanged mild soft tissue swelling lateral to the fifth metatarsal head.  No subcutaneous air.  No acute fracture or dislocation.  IMPRESSION: 1. Mild soft tissue swelling of the great toe appears to be decreased from 01/24/2022. 2. There is a 3 mm well-circumscribed lucency at the distal medial aspect of the proximal phalanx of the great toe, nonspecific. A thin amount of a overlying cortex appears intact, and this may represent degenerative change. No definitive cortical erosion to indicate radiographic evidence of acute osteomyelitis.  Electronically Signed   By: Bertina Broccoli M.D.   On: 07/25/2023 13:20 Note: Reviewed         Physical Exam  General appearance:  Well nourished,  well developed, and well hydrated. In no apparent acute distress Mental status: Alert, oriented x 3 (person, place, & time)       Respiratory: No evidence of acute respiratory distress Eyes: PERLA Vitals: BP (!) 153/88   Pulse 80   Temp (!) 97.2 F (36.2 C)   Resp 16   Ht 5' 11.5" (1.816 m)   Wt 265 lb (120.2 kg)   SpO2 97%   BMI 36.44 kg/m  BMI: Estimated body mass index is 36.44 kg/m as calculated from the following:   Height as of this encounter: 5' 11.5" (1.816 m).   Weight as of this encounter: 265 lb (120.2 kg). Ideal: Ideal body weight: 76.5 kg (168 lb 8.7 oz) Adjusted ideal body weight: 94 kg (207 lb 2 oz)  Assessment   Diagnosis Status  1. Spinal stenosis, lumbar region, with neurogenic claudication   2. Chronic radicular lumbar pain   3. Lumbar facet arthropathy   4. Chronic painful diabetic neuropathy (HCC)   5. Lumbar spondylosis    Controlled Controlled Controlled   Updated Problems: No problems updated.  Plan of Care  1.  Do not repeat Qutenza  2.  Repeat lumbar ESI as needed, patient still endorsing good benefit from his previous injection 3.  Consider axillary nerve block and/or suprascapular nerve block for increased shoulder pain related to rotator cuff dysfunction 4.  Consider genicular nerve block bilateral knee pain related to knee osteoarthritis   Orders:  No orders of the defined types were placed in this encounter.  Follow-up plan:   Return for patient will call to schedule F2F appt prn.     Left L5/S1 ESI, Qutenza  06/03/23    Recent Visits Date Type Provider Dept  07/01/23 Office Visit Cephus Collin, MD [facility]  06/03/23 Procedure visit Cephus Collin, MD [facility]  Showing recent visits within past 90 days and meeting all other requirements Today's Visits Date Type Provider Dept  08/31/23 Procedure visit Cephus Collin, MD [facility]  Showing today's visits and meeting all other requirements Future  Appointments No visits were found meeting these conditions. Showing future appointments within next 90 days and meeting all other requirements   I discussed the assessment and treatment plan with the patient. The patient was provided an opportunity to ask questions and all were answered. The patient agreed with the plan and demonstrated an understanding of the instructions.  Patient advised to call back or seek an in-person evaluation if the symptoms or condition worsens.  Duration of encounter: .  Total time on encounter, as per AMA guidelines included both the face-to-face and non-face-to-face time personally spent by the physician and/or other qualified health care professional(s) on the day of the encounter (includes time in activities that require the physician or other qualified health care professional and does not include time in activities normally performed by clinical staff). Physician's time may include the following activities when performed: Preparing to see the patient (e.g., pre-charting review of records, searching for previously ordered imaging, lab work, and nerve conduction tests) Review of prior analgesic pharmacotherapies. Reviewing PMP Interpreting ordered tests (e.g., lab work, imaging, nerve conduction tests) Performing post-procedure evaluations, including interpretation of diagnostic procedures Obtaining and/or reviewing separately obtained history Performing a medically appropriate examination and/or evaluation Counseling and educating the patient/family/caregiver Ordering medications, tests, or procedures Referring and communicating with other health care professionals (when not separately reported) Documenting clinical information in the electronic or other health record Independently interpreting results (  not separately reported) and communicating results to the patient/ family/caregiver Care coordination (not separately reported)  Note by: Cephus Collin, MD (TTS and AI technology used. I apologize for any typographical errors that were not detected and corrected.) Date: 08/31/2023; Time: 10:10 AM

## 2023-09-01 ENCOUNTER — Telehealth: Payer: Self-pay | Admitting: *Deleted

## 2023-09-01 NOTE — Telephone Encounter (Signed)
 Post procedure call;  did not have procedure.

## 2023-09-17 ENCOUNTER — Encounter: Payer: Self-pay | Admitting: Gastroenterology

## 2023-09-18 ENCOUNTER — Other Ambulatory Visit: Payer: Self-pay

## 2023-09-18 MED ORDER — CARVEDILOL 6.25 MG PO TABS: ORAL_TABLET | ORAL | 2 refills | Status: AC

## 2023-09-18 MED ORDER — FUROSEMIDE 40 MG PO TABS: ORAL_TABLET | ORAL | 1 refills | Status: DC

## 2023-09-23 NOTE — Progress Notes (Signed)
 [name] [address] [place] [address] Phone: [phone] Subjective:   Douglas [name], am serving as a scribe for Dr. Ronnell Coins.  I'm seeing this patient by the request  of:  [name], NP  CC: Left shoulder and bilateral knee pain  YQM:[id number]  06/24/2023 New problem, injection given, discussed icing regimen and home exercises. Post surgical repair multiple years ago. Discussed icing regimen and home exercises, increase activity slowly. Follow-up again in 6 to 8 weeks   Chronic problem with exacerbation.  Discussed icing regimen and home exercises, discussed which activities to do and which ones to avoid.  Increase activity slowly.  Discussed icing regimen.  Encouraged weight loss.  BMI is under 35 so could do surgical intervention is necessary but hopefully we will continue to make improvement without this.  Follow-up again in 6 to 8 weeks otherwise.      Update 09/24/2023 Douglas [name] is a 78 y.o. male coming in with complaint of L shoulder and B knee pain.  Last seen 3 months ago and given viscosupplementation in the knees bilaterally.  Patient states that his knees are better. Less popping and pain. When knees pop L knee is worse than R.   Has chronic L ankle swelling near end of day. Ankle pops intermittently. Has severe injury in 2003. Feels like he broke ankle in the 70s. States that he has gout and diabetic nerve pain in great toe and wants to make sure that he is not moving towards any problems with his feet. Antibiotics last visit were helpful.   L shoulder pain is the same as last visit. Pain over deltoid insertion. Hard to pick up gallon of milk. Unable to raise arm to wash under arm.      Reviewing patient's chart has been seen by pain medicine where he has been getting epidural steroid injections.  Have discussed the possibility of a geniculate block as well as an axillary nerve block for the shoulder.  Also  reviewed x-rays taken in February of the knee showing severe degenerative joint disease mostly of the medial compartment of the knees.  Past Medical History:  Diagnosis Date   Arthritis    Depression    Diabetes mellitus with complication (HCC)    Diabetic neuropathy (HCC)    Diastolic dysfunction    a. TTE 07/2017: EF 60-65%, mild concentric LVH, no RWMA, Gr1DD, trivial AI, mildly dilated LA, RVSF normal, PASP normal   Edema    feet/legs   GERD (gastroesophageal reflux disease)    Gout    History of stress test    a. MV 06/2017: small in size, mild in severity, apical anterior and apical defect that was minimally reversible and most likely represented artifact and less likely ischemia/scar, LVEF 55-65%, low risk, probably normal stress test   Hypertension    Hypothyroidism    Kidney cysts    renal failure 2013   Kidney stones    Dr. Francesco Inks   OSA (obstructive sleep apnea)    No oxygen at all in 2-3 years.   [place] spotted fever 12/10/2017   Positve IgG in titer on 11/20/17   S/P cardiac cath 1998   a. no obstructive disease   Syncope and collapse    Temporary low platelet count (HCC)    Past Surgical History:  Procedure Laterality Date   ANAL FISSURE REPAIR     BACK SURGERY  1977   rupture disc lumbar spine  CARDIAC CATHETERIZATION  [facility].    CATARACT EXTRACTION W/PHACO Right 07/16/2016   Procedure: CATARACT EXTRACTION PHACO AND INTRAOCULAR LENS PLACEMENT (IOC);  Surgeon: Steven Dingeldein, MD;  Location: [facility] ORS;  Service: Ophthalmology;  Laterality: Right;  US  01:11 AP% 17.6 CDE 24.83 fluid pack lot # [id number]   CATARACT EXTRACTION W/PHACO Left 08/13/2016   Procedure: CATARACT EXTRACTION PHACO AND INTRAOCULAR LENS PLACEMENT (IOC) suture placed in left eye at end of procedure;  Surgeon: Steven Dingeldein, MD;  Location: [facility] ORS;  Service: Ophthalmology;  Laterality: Left;  US  01:55 AP% 22.7 CDE 53.56 fluid pack  lot # [id number]   FINGER AMPUTATION     partial   FINGER ARTHROPLASTY     KNEE SURGERY  1993   arthroscopy   SHOULDER SURGERY Bilateral 1998   arthroscopic right, rotator cuff repair left   Social History   Socioeconomic History   Marital status: Married    Spouse name: Virginia    Number of children: 3   Years of education: 13   Highest education level: Not on file  Occupational History    Comment: retired  Tobacco Use   Smoking status: Never   Smokeless tobacco: Never  Vaping Use   Vaping status: Never Used  Substance and Sexual Activity   Alcohol use: Not Currently   Drug use: No   Sexual activity: Yes  Other Topics Concern   Not on file  Social History Narrative   Lives in [place] with his wife ([place]ia ). No children. Three step children. No pets.      Work - Patient is retired. Maintenance supervisor.      School - One year college education.      Right handed.      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               Social Drivers of Health   Financial Resource Strain: Low Risk  (08/19/2022)   Overall Financial Resource Strain (CARDIA)    Difficulty of Paying Living Expenses: Not hard at all  Food Insecurity: No Food Insecurity (08/19/2022)   Hunger Vital Sign    Worried About Running Out of Food in the Last Year: Never true    Ran Out of Food in the Last Year: Never true  Transportation Needs: No Transportation Needs (08/19/2022)   PRAPARE - Administrator, Civil Service (Medical): No    Lack of Transportation (Non-Medical): No  Physical Activity: Insufficiently Active (08/19/2022)   Exercise Vital Sign    Days of Exercise per Week: 7 days    Minutes of Exercise per Session: 20 min  Stress: No Stress Concern Present (08/19/2022)   [employer] of Occupational Health - Occupational Stress Questionnaire    Feeling of Stress : Not at all  Social Connections: Socially Integrated (08/19/2022)   Social Connection and Isolation  Panel    Frequency of Communication with Friends and Family: More than three times a week    Frequency of Social Gatherings with Friends and Family: More than three times a week    Attends Religious Services: More than 4 times per year    Active Member of [employer] or Organizations: Yes    Attends Engineer, structural: More than 4 times per year    Marital Status: Married   Allergies  Allergen Reactions   Bee Venom Swelling   Omeprazole Other (See Comments)    Other reaction(s): increased  sx on high dose   Benadryl [Diphenhydramine Hcl (Sleep)] Other (See Comments)    Hyperactivity    Enalapril Itching    itching   Gabapentin Palpitations and Rash    rash   Hctz [Hydrochlorothiazide] Other (See Comments)    Decreased potassium   Family History  Problem Relation Age of Onset   Breast cancer Mother    Lung cancer Mother    Bone cancer Mother    Heart Problems Mother    Cancer Mother        breast, lung and rib   AAA (abdominal aortic aneurysm) Mother    Heart disease Mother    Heart attack Father    Heart disease Father    Cancer Brother        esophageal   Heart attack Brother    Colon cancer Neg Hx    Liver disease Neg Hx     Current Outpatient Medications (Endocrine & Metabolic):    calcitRIOL  (ROCALTROL ) 0.25 MCG capsule, Take 1 capsule (0.25 mcg total) by mouth daily.   dapagliflozin  propanediol ([name] ) 10 MG TABS tablet, Take 1 tablet (10 mg total) by mouth daily.   insulin  degludec ([name]  FLEXTOUCH) 200 UNIT/ML FlexTouch Pen, Inject 40 Units into the skin daily.   levothyroxine  (SYNTHROID ) 100 MCG tablet, TAKE 1 TABLET(100 MCG) BY MOUTH DAILY BEFORE BREAKFAST   Semaglutide , 1 MG/DOSE, (OZEMPIC , 1 MG/DOSE,) 4 MG/3ML SOPN, Inject 1 mg into the skin once a week.   testosterone  cypionate (DEPOTESTOSTERONE CYPIONATE) 200 MG/ML injection, Inject 200 mg into the muscle every 14 (fourteen) days.  Current Outpatient Medications (Cardiovascular):    carvedilol   (COREG ) 6.25 MG tablet, TAKE 1 TABLET(6.25 MG) BY MOUTH TWICE DAILY WITH A MEAL   doxazosin  (CARDURA ) 4 MG tablet, Take 1 tablet (4 mg total) by mouth daily.   furosemide  (LASIX ) 40 MG tablet, TAKE 1 TABLET(40 MG) BY MOUTH DAILY   losartan  (COZAAR ) 25 MG tablet, TAKE 1 TABLET BY MOUTH EVERY MORNING THEN TAKE 2 TABLETS BY MOUTH EVERY EVENING   pravastatin  (PRAVACHOL ) 20 MG tablet, Take 1 tablet (20 mg total) by mouth daily.   tadalafil (CIALIS) 20 MG tablet, Take by mouth.  Current Outpatient Medications (Respiratory):    loratadine  (CLARITIN ) 10 MG tablet, Take 10 mg by mouth daily as needed for allergies.   mometasone  (NASONEX ) 50 MCG/ACT nasal spray, Place 2 sprays into the nose daily.  Current Outpatient Medications (Analgesics):    acetaminophen  (TYLENOL ) 500 MG tablet, Take 500 mg by mouth every 6 (six) hours as needed.   aspirin  81 MG tablet, Take 1 tablet (81 mg total) by mouth daily. (Patient taking differently: Take 325 mg by mouth daily.)   colchicine  0.6 MG tablet, Take 1 tablet once followed by another tablet 3 days later   Current Outpatient Medications (Other):    B Complex Vitamins (BL VITAMIN B COMPLEX PO), Take by mouth daily.   Cholecalciferol (VITAMIN D3 PO), Take by mouth daily.   clove oil liquid, Apply 1 application topically as needed.   cyclobenzaprine (FLEXERIL) 10 MG tablet, Take 1 tablet by mouth as needed.   doxycycline  (VIBRA -TABS) 100 MG tablet, Take 1 tablet (100 mg total) by mouth 2 (two) times daily.   DULoxetine  (CYMBALTA ) 60 MG capsule, TAKE 1 CAPSULE(60 MG) BY MOUTH DAILY   esomeprazole  (NEXIUM ) 40 MG capsule, Take 1 capsule (40 mg total) by mouth daily at 12 noon.   finasteride  (PROSCAR ) 5 MG tablet, TAKE 1 TABLET(5 MG) BY MOUTH AT BEDTIME (  Patient taking differently: 5 mg at bedtime as needed. TAKE 1 TABLET(5 MG) BY MOUTH AT BEDTIME)   gentamicin  cream (GARAMYCIN ) 0.1 %, Apply 1 application. topically 2 (two) times daily.   Insulin  Pen Needle (NOVOFINE  PEN NEEDLE) 32G X 6 MM MISC, 1 each by Does not apply route 2 (two) times daily.   MAGNESIUM  PO, Take 400 mg by mouth daily at 8 pm. 1 tab in the a.m. and 2 at bedtime   Multiple Vitamins-Minerals (MULTIVITAMIN MEN PO), Take 1 tablet by mouth daily.   Needle, Disp, (HYPODERMIC NEEDLE 18GX1) 18G X 1 MISC, Use every 14 days to draw up testosterone    Needle, Disp, (HYPODERMIC NEEDLE 25GX1) 25G X 1 MISC, Use every 14 days to administer testosterone    pregabalin  (LYRICA ) 200 MG capsule, TAKE 1 CAPSULE(200 MG) BY MOUTH TWICE DAILY   Reviewed prior external information including notes and imaging from  primary care provider As well as notes that were available from care everywhere and other healthcare systems.  Past medical history, social, surgical and family history all reviewed in electronic medical record.  No pertanent information unless stated regarding to the chief complaint.   Review of Systems:  No headache, visual changes, nausea, vomiting, diarrhea, constipation, dizziness, abdominal pain, skin rash, fevers, chills, night sweats, weight loss, swollen lymph nodes, body aches, joint swelling, chest pain, shortness of breath, mood changes. POSITIVE muscle aches  Objective  Blood pressure (!) 122/90, pulse 68, height 5' 11 (1.803 m), SpO2 98%.   General: No apparent distress alert and oriented x3 mood and affect normal, dressed appropriately.  HEENT: Pupils equal, extraocular movements intact  Respiratory: Patient's speak in full sentences and does not appear short of breath   Knee exam shows patient does have arthritic changes noted at the knee.  Shoulder exam shows patient does have weakness with 3 out of 5 strength of the rotator cuff on the left side.  Positive crossover sign.  Left ankle exam does have 1+ dorsalis pedis pulse compared to the contralateral side.  Patient does have a rigid midfoot noted.  High arch noted.  Limited muscular skeletal ultrasound was performed and  interpreted by Ronnell Coins, M  Limited ultrasound of patient's shoulder shows that there is not likely a tear noted of the rotator cuff.  This seems to be the subscapularis and the supraspinatus.  Difficult to assess though secondary to postsurgical changes also noted.  Moderate arthritic changes and narrowing of the acromioclavicular joint.   Impression and Recommendations:    The above documentation has been reviewed and is accurate and complete Tallis Soledad M Anslie Spadafora, DO

## 2023-09-24 ENCOUNTER — Ambulatory Visit: Admitting: Family Medicine

## 2023-09-24 ENCOUNTER — Ambulatory Visit

## 2023-09-24 ENCOUNTER — Encounter: Payer: Self-pay | Admitting: Family Medicine

## 2023-09-24 ENCOUNTER — Other Ambulatory Visit: Payer: Self-pay

## 2023-09-24 ENCOUNTER — Other Ambulatory Visit: Payer: Self-pay | Admitting: Family Medicine

## 2023-09-24 VITALS — BP 122/90 | HR 68 | Ht 71.0 in

## 2023-09-24 DIAGNOSIS — M79605 Pain in left leg: Secondary | ICD-10-CM

## 2023-09-24 DIAGNOSIS — M79675 Pain in left toe(s): Secondary | ICD-10-CM

## 2023-09-24 DIAGNOSIS — M79604 Pain in right leg: Secondary | ICD-10-CM | POA: Diagnosis not present

## 2023-09-24 DIAGNOSIS — M25572 Pain in left ankle and joints of left foot: Secondary | ICD-10-CM

## 2023-09-24 DIAGNOSIS — M79674 Pain in right toe(s): Secondary | ICD-10-CM

## 2023-09-24 DIAGNOSIS — M67912 Unspecified disorder of synovium and tendon, left shoulder: Secondary | ICD-10-CM | POA: Diagnosis not present

## 2023-09-24 DIAGNOSIS — M25512 Pain in left shoulder: Secondary | ICD-10-CM

## 2023-09-24 NOTE — Patient Instructions (Addendum)
 Xray ankle today Exercises HOKA recovery sandals Gravity defyer shoes  [facility]: [facility] [address] [place],  [place]  [address] Main: [phone] 10/09/2023 1:30pm  MRI L shoulder See me again in 2 months

## 2023-09-24 NOTE — Assessment & Plan Note (Signed)
 Failed all conservative therapy, has had this now nearly 2 years and intermittently was responding to injections.  Postsurgical changes are noted on ultrasound and I do feel that advanced imaging is warranted at this time.  Discussed icing regimen of home exercises otherwise but I do feel an MRI without contrast to further evaluate if any surgical intervention is needed with patient now having difficulty with daily activities.  Once again failed conservative therapy including injections and formal physical therapy.

## 2023-09-24 NOTE — Assessment & Plan Note (Signed)
 Patient does have some neuropathy and does have spinal stenosis of the lower back that could be more of a neurogenic claudication but I am concerned for potential blood vessel pathology that could be contributing and ABI ordered.  Patient does have a rigid midfoot and we discussed more supportive shoes including rigid bottom shoes as well as recovery sandals in the house.  Patient may need custom orthotics if this does not seem to make improvement.  Follow-up again in 6 to 8 weeks otherwise.

## 2023-09-25 ENCOUNTER — Encounter: Payer: Self-pay | Admitting: Gastroenterology

## 2023-09-25 ENCOUNTER — Ambulatory Visit (AMBULATORY_SURGERY_CENTER): Admitting: Gastroenterology

## 2023-09-25 VITALS — BP 163/94 | HR 69 | Temp 97.2°F | Resp 16 | Ht 71.0 in | Wt 260.0 lb

## 2023-09-25 DIAGNOSIS — K5730 Diverticulosis of large intestine without perforation or abscess without bleeding: Secondary | ICD-10-CM | POA: Diagnosis not present

## 2023-09-25 DIAGNOSIS — K648 Other hemorrhoids: Secondary | ICD-10-CM | POA: Diagnosis not present

## 2023-09-25 DIAGNOSIS — K6289 Other specified diseases of anus and rectum: Secondary | ICD-10-CM | POA: Diagnosis not present

## 2023-09-25 DIAGNOSIS — D508 Other iron deficiency anemias: Secondary | ICD-10-CM

## 2023-09-25 MED ORDER — SODIUM CHLORIDE 0.9 % IV SOLN: 500.0000 mL | Freq: Once | INTRAVENOUS | Status: DC

## 2023-09-25 NOTE — Op Note (Signed)
 Douglas [name] Patient Name: Douglas [name] Procedure Date: 09/25/2023 10:56 AM MRN: [id number] Endoscopist: Ace Abu L. Dominic Friendly , MD, [id number] Age: 78 Referring MD:  Date of Birth: [birth year] Gender: Male Account #: [id number] Procedure:                Upper GI endoscopy Indications:              Unexplained iron deficiency anemia                           ferritin 13, lowest hemoglobin in last year = 12.7                            (more recently 13.4) Medicines:                Monitored Anesthesia Care Procedure:                Pre-Anesthesia Assessment:                           - Prior to the procedure, a History and Physical                            was performed, and patient medications and                            allergies were reviewed. The patient's tolerance of                            previous anesthesia was also reviewed. The risks                            and benefits of the procedure and the sedation                            options and risks were discussed with the patient.                            All questions were answered, and informed consent                            was obtained. Prior Anticoagulants: The patient has                            taken no anticoagulant or antiplatelet agents. ASA                            Grade Assessment: III - A patient with severe                            systemic disease. After reviewing the risks and                            benefits, the patient was deemed in satisfactory  condition to undergo the procedure.                           After obtaining informed consent, the endoscope was                            passed under direct vision. Throughout the                            procedure, the patient's blood pressure, pulse, and                            oxygen saturations were monitored continuously. The                            GIF [id number] #[id number] was introduced  through the                            mouth, and advanced to the second part of duodenum.                            The upper GI endoscopy was accomplished without                            difficulty. The patient tolerated the procedure                            well. Scope In: Scope Out: Findings:                 The esophagus was normal.                           The stomach was normal.                           The cardia and gastric fundus were normal on                            retroflexion.                           Normal mucosa was found in the entire duodenum.                            Biopsies for histology were taken with a cold                            forceps for evaluation of celiac disease. Complications:            No immediate complications. Estimated Blood Loss:     Estimated blood loss: none. Estimated blood loss                            was minimal. Impression:               - Normal esophagus.                           -  Normal stomach.                           - Normal mucosa was found in the entire examined                            duodenum. Biopsied. Recommendation:           - Patient has a contact number available for                            emergencies. The signs and symptoms of potential                            delayed complications were discussed with the                            patient. Return to normal activities tomorrow.                            Written discharge instructions were provided to the                            patient.                           - Resume previous diet.                           - Continue present medications.                           - Await pathology results.                           - See the other procedure note for documentation of                            additional recommendations. Fidencia Mccloud L. Dominic Friendly, MD 09/25/2023 11:38:22 AM This report has been signed electronically.

## 2023-09-25 NOTE — Patient Instructions (Signed)
 Thank you for letting us  take care of your healthcare needs today. Please see handouts given to you on Diverticulosis and Hemorrhoids.     YOU HAD AN ENDOSCOPIC PROCEDURE TODAY AT THE  ENDOSCOPY CENTER:   Refer to the procedure report that was given to you for any specific questions about what was found during the examination.  If the procedure report does not answer your questions, please call your gastroenterologist to clarify.  If you requested that your care partner not be given the details of your procedure findings, then the procedure report has been included in a sealed envelope for you to review at your convenience later.  YOU SHOULD EXPECT: Some feelings of bloating in the abdomen. Passage of more gas than usual.  Walking can help get rid of the air that was put into your GI tract during the procedure and reduce the bloating. If you had a lower endoscopy (such as a colonoscopy or flexible sigmoidoscopy) you may notice spotting of blood in your stool or on the toilet paper. If you underwent a bowel prep for your procedure, you may not have a normal bowel movement for a few days.  Please Note:  You might notice some irritation and congestion in your nose or some drainage.  This is from the oxygen used during your procedure.  There is no need for concern and it should clear up in a day or so.  SYMPTOMS TO REPORT IMMEDIATELY:  Following lower endoscopy (colonoscopy or flexible sigmoidoscopy):  Excessive amounts of blood in the stool  Significant tenderness or worsening of abdominal pains  Swelling of the abdomen that is new, acute  Fever of 100F or higher  Following upper endoscopy (EGD)  Vomiting of blood or coffee ground material  New chest pain or pain under the shoulder blades  Painful or persistently difficult swallowing  New shortness of breath  Fever of 100F or higher  Black, tarry-looking stools  For urgent or emergent issues, a gastroenterologist can be reached at  any hour by calling [phone]. Do not use MyChart messaging for urgent concerns.    DIET:  We do recommend a small meal at first, but then you may proceed to your regular diet.  Drink plenty of fluids but you should avoid alcoholic beverages for 24 hours.  ACTIVITY:  You should plan to take it easy for the rest of today and you should NOT DRIVE or use heavy machinery until tomorrow (because of the sedation medicines used during the test).    FOLLOW UP: Our staff will call the number listed on your records the next business day following your procedure.  We will call around 7:15- 8:00 am to check on you and address any questions or concerns that you may have regarding the information given to you following your procedure. If we do not reach you, we will leave a message.     If any biopsies were taken you will be contacted by phone or by letter within the next 1-3 weeks.  Please call us  at [phone] if you have not heard about the biopsies in 3 weeks.    SIGNATURES/CONFIDENTIALITY: You and/or your care partner have signed paperwork which will be entered into your electronic medical record.  These signatures attest to the fact that that the information above on your After Visit Summary has been reviewed and is understood.  Full responsibility of the confidentiality of this discharge information lies with you and/or your care-partner.

## 2023-09-25 NOTE — Progress Notes (Signed)
 History and Physical:  This patient presents for endoscopic testing for: Encounter Diagnosis  Name Primary?   Other iron deficiency anemia Yes    78 year old and here today for evaluation of iron deficiency anemia. Clinical details of this are outlined in the [facility] consult note dated 07/29/23, with no significant clinical changes since then. Lowest hemoglobin in the last year was 12.21 March 2023, more recently 13.4 with a ferritin of 13 in March 2025 No polyps last colonoscopy in 2017 Patient is otherwise without complaints or active issues today.   Past Medical History: Past Medical History:  Diagnosis Date   Arthritis    Depression    Diabetes mellitus with complication (HCC)    Diabetic neuropathy (HCC)    Diastolic dysfunction    a. TTE 07/2017: EF 60-65%, mild concentric LVH, no RWMA, Gr1DD, trivial AI, mildly dilated LA, RVSF normal, PASP normal   Edema    feet/legs   GERD (gastroesophageal reflux disease)    Gout    History of stress test    a. MV 06/2017: small in size, mild in severity, apical anterior and apical defect that was minimally reversible and most likely represented artifact and less likely ischemia/scar, LVEF 55-65%, low risk, probably normal stress test   Hypertension    Hypothyroidism    Kidney cysts    renal failure 2013   Kidney stones    Dr. Francesco Inks   OSA (obstructive sleep apnea)    No oxygen at all in 2-3 years.   [place] spotted fever 12/10/2017   Positve IgG in titer on 11/20/17   S/P cardiac cath 1998   a. no obstructive disease   Syncope and collapse    Temporary low platelet count (HCC)      Past Surgical History: Past Surgical History:  Procedure Laterality Date   ANAL FISSURE REPAIR     BACK SURGERY  1977   rupture disc lumbar spine   CARDIAC CATHETERIZATION  1998   [facility].    CATARACT EXTRACTION W/PHACO Right 07/16/2016   Procedure: CATARACT EXTRACTION PHACO AND  INTRAOCULAR LENS PLACEMENT (IOC);  Surgeon: Steven Dingeldein, MD;  Location: [facility] ORS;  Service: Ophthalmology;  Laterality: Right;  US  01:11 AP% 17.6 CDE 24.83 fluid pack lot # [id number]   CATARACT EXTRACTION W/PHACO Left 08/13/2016   Procedure: CATARACT EXTRACTION PHACO AND INTRAOCULAR LENS PLACEMENT (IOC) suture placed in left eye at end of procedure;  Surgeon: Steven Dingeldein, MD;  Location: [facility] ORS;  Service: Ophthalmology;  Laterality: Left;  US  01:55 AP% 22.7 CDE 53.56 fluid pack lot # [id number]   FINGER AMPUTATION     partial   FINGER ARTHROPLASTY     KNEE SURGERY  1993   arthroscopy   SHOULDER SURGERY Bilateral 1998   arthroscopic right, rotator cuff repair left    Allergies: Allergies  Allergen Reactions   Bee Venom Swelling   Benadryl [Diphenhydramine Hcl (Sleep)] Other (See Comments)    Hyperactivity    Enalapril Itching    itching   Gabapentin Palpitations and Rash    rash   Hctz [Hydrochlorothiazide] Other (See Comments)    Decreased potassium   Omeprazole Other (See Comments)    Other reaction(s): increased sx on high dose    Outpatient Meds: Current Outpatient Medications  Medication Sig Dispense Refill   acetaminophen  (TYLENOL ) 500 MG tablet Take 500 mg by mouth every 6 (six) hours as needed.     carvedilol  (COREG ) 6.25 MG  tablet TAKE 1 TABLET(6.25 MG) BY MOUTH TWICE DAILY WITH A MEAL 180 tablet 2   Cholecalciferol (VITAMIN D3 PO) Take by mouth daily.     clove oil liquid Apply 1 application topically as needed.     doxazosin  (CARDURA ) 4 MG tablet Take 1 tablet (4 mg total) by mouth daily. 90 tablet 1   doxycycline  (VIBRA -TABS) 100 MG tablet Take 1 tablet (100 mg total) by mouth 2 (two) times daily. 14 tablet 0   DULoxetine  (CYMBALTA ) 60 MG capsule TAKE 1 CAPSULE(60 MG) BY MOUTH DAILY 90 capsule 3   furosemide  (LASIX ) 40 MG tablet TAKE 1 TABLET(40 MG) BY MOUTH DAILY 90 tablet 1   insulin  degludec ([name]  FLEXTOUCH) 200 UNIT/ML FlexTouch Pen Inject 40  Units into the skin daily. 15 mL 3   Insulin  Pen Needle (NOVOFINE PEN NEEDLE) 32G X 6 MM MISC 1 each by Does not apply route 2 (two) times daily. 1 each 3   levothyroxine  (SYNTHROID ) 100 MCG tablet TAKE 1 TABLET(100 MCG) BY MOUTH DAILY BEFORE BREAKFAST 90 tablet 3   losartan  (COZAAR ) 25 MG tablet TAKE 1 TABLET BY MOUTH EVERY MORNING THEN TAKE 2 TABLETS BY MOUTH EVERY EVENING 270 tablet 0   MAGNESIUM  PO Take 400 mg by mouth daily at 8 pm. 1 tab in the a.m. and 2 at bedtime     pravastatin  (PRAVACHOL ) 20 MG tablet Take 1 tablet (20 mg total) by mouth daily. 90 tablet 3   pregabalin  (LYRICA ) 200 MG capsule TAKE 1 CAPSULE(200 MG) BY MOUTH TWICE DAILY 180 capsule 1   aspirin  81 MG tablet Take 1 tablet (81 mg total) by mouth daily. (Patient taking differently: Take 325 mg by mouth daily.)     B Complex Vitamins (BL VITAMIN B COMPLEX PO) Take by mouth daily.     calcitRIOL  (ROCALTROL ) 0.25 MCG capsule Take 1 capsule (0.25 mcg total) by mouth daily. 30 capsule 0   colchicine  0.6 MG tablet Take 1 tablet once followed by another tablet 3 days later 3 tablet 0   cyclobenzaprine (FLEXERIL) 10 MG tablet Take 1 tablet by mouth as needed.     dapagliflozin  propanediol ([name] ) 10 MG TABS tablet Take 1 tablet (10 mg total) by mouth daily. 90 tablet 3   esomeprazole  (NEXIUM ) 40 MG capsule Take 1 capsule (40 mg total) by mouth daily at 12 noon. 90 capsule 3   finasteride  (PROSCAR ) 5 MG tablet TAKE 1 TABLET(5 MG) BY MOUTH AT BEDTIME (Patient taking differently: 5 mg at bedtime as needed. TAKE 1 TABLET(5 MG) BY MOUTH AT BEDTIME) 90 tablet 3   gentamicin  cream (GARAMYCIN ) 0.1 % Apply 1 application. topically 2 (two) times daily. 30 g 1   loratadine  (CLARITIN ) 10 MG tablet Take 10 mg by mouth daily as needed for allergies.     mometasone  (NASONEX ) 50 MCG/ACT nasal spray Place 2 sprays into the nose daily. 1 each 12   Multiple Vitamins-Minerals (MULTIVITAMIN MEN PO) Take 1 tablet by mouth daily.     Needle, Disp,  (HYPODERMIC NEEDLE 18GX1) 18G X 1 MISC Use every 14 days to draw up testosterone      Needle, Disp, (HYPODERMIC NEEDLE 25GX1) 25G X 1 MISC Use every 14 days to administer testosterone      Semaglutide , 1 MG/DOSE, (OZEMPIC , 1 MG/DOSE,) 4 MG/3ML SOPN Inject 1 mg into the skin once a week. 3 mL 3   tadalafil (CIALIS) 20 MG tablet Take by mouth.     testosterone  cypionate (DEPOTESTOSTERONE CYPIONATE) 200 MG/ML injection Inject  200 mg into the muscle every 14 (fourteen) days.     Current Facility-Administered Medications  Medication Dose Route Frequency Provider Last Rate Last Admin   0.9 %  sodium chloride  infusion  500 mL Intravenous Once Danis, Grove Defina L III, MD          ___________________________________________________________________ Objective   Exam:  BP (!) 193/110   Pulse 68   Temp (!) 97.2 F (36.2 C)   Resp 11   Ht 5' 11 (1.803 m)   Wt 260 lb (117.9 kg)   SpO2 96%   BMI 36.26 kg/m   CV: regular , S1/S2 Resp: clear to auscultation bilaterally, normal RR and effort noted GI: soft, no tenderness, with active bowel sounds.   Assessment: Encounter Diagnosis  Name Primary?   Other iron deficiency anemia Yes     Plan: Colonoscopy EGD  The benefits and risks of the planned procedure(s) were described in detail with the patient or (when appropriate) their health care proxy.  Risks were outlined as including, but not limited to, bleeding, infection, perforation, adverse medication reaction leading to cardiac or pulmonary decompensation, pancreatitis (if ERCP).  The limitation of incomplete mucosal visualization was also discussed.  No guarantees or warranties were given.  The patient is appropriate for an endoscopic procedure in the ambulatory setting.   - Lorella Roles, MD

## 2023-09-25 NOTE — Op Note (Signed)
  [facility] Patient Name: Douglas [name] Procedure Date: 09/25/2023 10:50 AM MRN: [id number] Endoscopist: Ace Abu L. Dominic Friendly , MD, [id number] Age: 78 Referring MD:  Date of Birth: 04-Feb-1946 Gender: Male Account #: [id number] Procedure:                Colonoscopy Indications:              Unexplained iron deficiency anemia Medicines:                Monitored Anesthesia Care Procedure:                Pre-Anesthesia Assessment:                           - Prior to the procedure, a History and Physical                            was performed, and patient medications and                            allergies were reviewed. The patient's tolerance of                            previous anesthesia was also reviewed. The risks                            and benefits of the procedure and the sedation                            options and risks were discussed with the patient.                            All questions were answered, and informed consent                            was obtained. Prior Anticoagulants: The patient has                            taken no anticoagulant or antiplatelet agents. ASA                            Grade Assessment: III - A patient with severe                            systemic disease. After reviewing the risks and                            benefits, the patient was deemed in satisfactory                            condition to undergo the procedure.                           After obtaining informed consent, the colonoscope  was passed under direct vision. Throughout the                            procedure, the patient's blood pressure, pulse, and                            oxygen saturations were monitored continuously. The                            CF [id number] #[id number] was introduced through the anus                            and advanced to the the cecum, identified by                            appendiceal orifice  and ileocecal valve. The                            colonoscopy was performed without difficulty. The                            patient tolerated the procedure well. The quality                            of the bowel preparation was good after lavage. The                            ileocecal valve, appendiceal orifice, and rectum                            were photographed. Scope In: 11:21:59 AM Scope Out: 11:34:15 AM Scope Withdrawal Time: 0 hours 9 minutes 44 seconds  Total Procedure Duration: 0 hours 12 minutes 16 seconds  Findings:                 The perianal and digital rectal examinations were                            normal.                           A few small-mouthed diverticula were found in the                            cecum.                           Repeat examination of right colon under NBI                            performed.                           Internal hemorrhoids were found.  Anal papilla(e) were hypertrophied.                           The exam was otherwise without abnormality on                            direct and retroflexion views. Complications:            No immediate complications. Estimated Blood Loss:     Estimated blood loss was minimal. Impression:               - Diverticulosis in the cecum.                           - Internal hemorrhoids.                           - Anal papilla(e) were hypertrophied.                           - The examination was otherwise normal on direct                            and retroflexion views.                           - No specimens collected. Recommendation:           - Patient has a contact number available for                            emergencies. The signs and symptoms of potential                            delayed complications were discussed with the                            patient. Return to normal activities tomorrow.                            Written discharge  instructions were provided to the                            patient.                           - Resume previous diet.                           - Continue present medications.                           - No repeat routine colonoscopy for CRC screening                            purposes recommended.                           -  See the other procedure note for documentation of                            additional recommendations.                           - Arrange a set of cards for FOBT.                           If positive, plan for small bowel video capsule                            study Douglas [name] L. Dominic Friendly, MD 09/25/2023 11:43:08 AM This report has been signed electronically.

## 2023-09-25 NOTE — Progress Notes (Signed)
 Pt's states no medical or surgical changes since previsit or office visit.

## 2023-09-25 NOTE — Progress Notes (Signed)
 Report to PACU, RN, vss, BBS= Clear.

## 2023-09-25 NOTE — Progress Notes (Signed)
 Called to room to assist during endoscopic procedure.  Patient ID and intended procedure confirmed with present staff. Received instructions for my participation in the procedure from the performing physician.

## 2023-09-28 ENCOUNTER — Ambulatory Visit (INDEPENDENT_AMBULATORY_CARE_PROVIDER_SITE_OTHER): Admitting: *Deleted

## 2023-09-28 ENCOUNTER — Telehealth: Payer: Self-pay

## 2023-09-28 DIAGNOSIS — E538 Deficiency of other specified B group vitamins: Secondary | ICD-10-CM

## 2023-09-28 MED ORDER — CYANOCOBALAMIN 1000 MCG/ML IJ SOLN
1000.0000 ug | Freq: Once | INTRAMUSCULAR | Status: AC
  Administered 2023-09-28: 1000 ug via INTRAMUSCULAR

## 2023-09-28 NOTE — Progress Notes (Signed)
Pt received B12 injection in left deltoid muscle. Pt tolerated it well with no complaints or concerns.  

## 2023-09-28 NOTE — Telephone Encounter (Signed)
 Left message

## 2023-09-28 NOTE — Telephone Encounter (Signed)
 Pt assistance forms received needing provider signature for Refill and provider change for [name] . Form placed in provider to be signed folder.

## 2023-09-29 LAB — SURGICAL PATHOLOGY

## 2023-09-29 NOTE — Telephone Encounter (Signed)
 Form faxed to provided number with a completed transmission log

## 2023-09-30 ENCOUNTER — Ambulatory Visit: Payer: Self-pay | Admitting: Gastroenterology

## 2023-10-01 ENCOUNTER — Ambulatory Visit (INDEPENDENT_AMBULATORY_CARE_PROVIDER_SITE_OTHER): Admitting: Nurse Practitioner

## 2023-10-01 ENCOUNTER — Encounter: Payer: Self-pay | Admitting: Nurse Practitioner

## 2023-10-01 VITALS — BP 124/78 | HR 76 | Temp 97.8°F | Resp 20 | Ht 71.0 in | Wt 259.0 lb

## 2023-10-01 DIAGNOSIS — E611 Iron deficiency: Secondary | ICD-10-CM | POA: Diagnosis not present

## 2023-10-01 DIAGNOSIS — E1142 Type 2 diabetes mellitus with diabetic polyneuropathy: Secondary | ICD-10-CM

## 2023-10-01 DIAGNOSIS — E1140 Type 2 diabetes mellitus with diabetic neuropathy, unspecified: Secondary | ICD-10-CM | POA: Diagnosis not present

## 2023-10-01 DIAGNOSIS — I10 Essential (primary) hypertension: Secondary | ICD-10-CM

## 2023-10-01 DIAGNOSIS — Z7985 Long-term (current) use of injectable non-insulin antidiabetic drugs: Secondary | ICD-10-CM

## 2023-10-01 DIAGNOSIS — F419 Anxiety disorder, unspecified: Secondary | ICD-10-CM

## 2023-10-01 DIAGNOSIS — M549 Dorsalgia, unspecified: Secondary | ICD-10-CM

## 2023-10-01 DIAGNOSIS — E785 Hyperlipidemia, unspecified: Secondary | ICD-10-CM | POA: Diagnosis not present

## 2023-10-01 DIAGNOSIS — F32A Depression, unspecified: Secondary | ICD-10-CM

## 2023-10-01 DIAGNOSIS — M1A3790 Chronic gout due to renal impairment, unspecified ankle and foot, without tophus (tophi): Secondary | ICD-10-CM | POA: Diagnosis not present

## 2023-10-01 DIAGNOSIS — G8929 Other chronic pain: Secondary | ICD-10-CM

## 2023-10-01 DIAGNOSIS — Z7984 Long term (current) use of oral hypoglycemic drugs: Secondary | ICD-10-CM

## 2023-10-01 LAB — IBC + FERRITIN
Ferritin: 30.2 ng/mL (ref 22.0–322.0)
Iron: 51 ug/dL (ref 42–165)
Saturation Ratios: 14.5 % — ABNORMAL LOW (ref 20.0–50.0)
TIBC: 351.4 ug/dL (ref 250.0–450.0)
Transferrin: 251 mg/dL (ref 212.0–360.0)

## 2023-10-01 LAB — COMPREHENSIVE METABOLIC PANEL WITH GFR
ALT: 25 U/L (ref 0–53)
AST: 23 U/L (ref 0–37)
Albumin: 3.9 g/dL (ref 3.5–5.2)
Alkaline Phosphatase: 68 U/L (ref 39–117)
BUN: 16 mg/dL (ref 6–23)
CO2: 32 meq/L (ref 19–32)
Calcium: 9 mg/dL (ref 8.4–10.5)
Chloride: 101 meq/L (ref 96–112)
Creatinine, Ser: 1.43 mg/dL (ref 0.40–1.50)
GFR: 46.99 mL/min — ABNORMAL LOW (ref >60.00)
Glucose, Bld: 142 mg/dL — ABNORMAL HIGH (ref 70–99)
Potassium: 4.1 meq/L (ref 3.5–5.1)
Sodium: 140 meq/L (ref 135–145)
Total Bilirubin: 0.6 mg/dL (ref 0.2–1.2)
Total Protein: 6.4 g/dL (ref 6.0–8.3)

## 2023-10-01 LAB — LIPID PANEL
Cholesterol: 131 mg/dL (ref 0–200)
HDL: 38 mg/dL — ABNORMAL LOW (ref >39.00)
LDL Cholesterol: 57 mg/dL (ref 0–99)
NonHDL: 93.09
Total CHOL/HDL Ratio: 3
Triglycerides: 181 mg/dL — ABNORMAL HIGH (ref 0.0–149.0)
VLDL: 36.2 mg/dL (ref 0.0–40.0)

## 2023-10-01 LAB — CBC WITH DIFFERENTIAL/PLATELET
Basophils Absolute: 0.1 10*3/uL (ref 0.0–0.1)
Basophils Relative: 0.9 % (ref 0.0–3.0)
Eosinophils Absolute: 0.4 10*3/uL (ref 0.0–0.7)
Eosinophils Relative: 5.6 % — ABNORMAL HIGH (ref 0.0–5.0)
HCT: 42.2 % (ref 39.0–52.0)
Hemoglobin: 13.7 g/dL (ref 13.0–17.0)
Lymphocytes Relative: 25.6 % (ref 12.0–46.0)
Lymphs Abs: 1.6 10*3/uL (ref 0.7–4.0)
MCHC: 32.4 g/dL (ref 30.0–36.0)
MCV: 84.3 fl (ref 78.0–100.0)
Monocytes Absolute: 0.5 10*3/uL (ref 0.1–1.0)
Monocytes Relative: 8.5 % (ref 3.0–12.0)
Neutro Abs: 3.8 10*3/uL (ref 1.4–7.7)
Neutrophils Relative %: 59.4 % (ref 43.0–77.0)
Platelets: 220 10*3/uL (ref 150.0–400.0)
RBC: 5.01 Mil/uL (ref 4.22–5.81)
RDW: 16.7 % — ABNORMAL HIGH (ref 11.5–15.5)
WBC: 6.4 10*3/uL (ref 4.0–10.5)

## 2023-10-01 LAB — HEMOGLOBIN A1C: Hgb A1c MFr Bld: 7.5 % — ABNORMAL HIGH (ref 4.6–6.5)

## 2023-10-01 LAB — URIC ACID: Uric Acid, Serum: 3.8 mg/dL — ABNORMAL LOW (ref 4.0–7.8)

## 2023-10-01 MED ORDER — PREGABALIN 225 MG PO CAPS: 225.0000 mg | ORAL_CAPSULE | Freq: Two times a day (BID) | ORAL | 1 refills | Status: DC

## 2023-10-01 MED ORDER — CYCLOBENZAPRINE HCL 10 MG PO TABS: 10.0000 mg | ORAL_TABLET | Freq: Three times a day (TID) | ORAL | 2 refills | Status: DC | PRN

## 2023-10-01 MED ORDER — COLCHICINE 0.6 MG PO TABS: ORAL_TABLET | ORAL | 0 refills | Status: AC

## 2023-10-01 NOTE — Progress Notes (Unsigned)
 Leron Glance, NP-C Phone: [phone]  Douglas [name] is a 78 y.o. male who presents today for follow up.   Discussed the use of AI scribe software for clinical note transcription with the patient, who gave verbal consent to proceed.  History of Present Illness   Douglas [name] is a 78 year old male with iron deficiency anemia and diabetes who presents for follow-up and lab evaluation.  He has iron deficiency anemia with no gastrointestinal source identified after normal EGD and colonoscopy results, including a biopsy. He is using stool cards weekly for analysis. He previously stopped oral iron supplements due to an upcoming MRI and experienced constipation as a side effect when taking them.  He has a history of diabetes and is currently on Ozempic  2 mg, [name] , and [name] . His last A1c was 7.9, higher than the previous reading. He does not regularly check his blood sugar at home and has excessive thirst but no excessive urination or hypoglycemic episodes. He acknowledges consuming too many sweets in his diet.  He was switched from simvastatin  to pravastatin  and reports no side effects from the new medication, although he continues to experience generalized aches. He is also on losartan , doxazosin , and carvedilol  for blood pressure management, which is well-controlled.  He experiences dizziness, described more as a balance issue rather than true dizziness. He is taking Cymbalta  for mood, which helps manage symptoms of irritability and depression, attributed to decreased physical activity due to knee issues. He also takes pregabalin  (Lyrica ) 200 mg twice daily for neuropathy pain in his feet, which helps but leaves a gap in symptom control between doses.  He has a history of gout and is currently on allopurinol  100 mg daily. He recently experienced a gout flare and was prescribed colchicine , which he took as directed. The gout symptoms have improved but not completely resolved. He has  not seen podiatry since a toenail removal procedure, but he reports recurrent toe pain that he suspects may be related to gout.  He mentions a past use of Flexeril  for back strain, which he is currently out of, and he continues to see pain management for his chronic pain issues.  No chest pain, shortness of breath, or low blood sugars. He reports excessive thirst and some dizziness, attributed more to balance issues. He experiences generalized aches and has a history of neuropathy pain in his feet.      Social History   Tobacco Use  Smoking Status Never  Smokeless Tobacco Never    Current Outpatient Medications on File Prior to Visit  Medication Sig Dispense Refill   acetaminophen  (TYLENOL ) 500 MG tablet Take 500 mg by mouth every 6 (six) hours as needed.     aspirin  81 MG tablet Take 1 tablet (81 mg total) by mouth daily. (Patient taking differently: Take 325 mg by mouth daily.)     B Complex Vitamins (BL VITAMIN B COMPLEX PO) Take by mouth daily.     calcitRIOL  (ROCALTROL ) 0.25 MCG capsule Take 1 capsule (0.25 mcg total) by mouth daily. 30 capsule 0   carvedilol  (COREG ) 6.25 MG tablet TAKE 1 TABLET(6.25 MG) BY MOUTH TWICE DAILY WITH A MEAL 180 tablet 2   Cholecalciferol (VITAMIN D3 PO) Take by mouth daily.     clove oil liquid Apply 1 application topically as needed.     dapagliflozin  propanediol ([name] ) 10 MG TABS tablet Take 1 tablet (10 mg total) by mouth daily. 90 tablet 3   doxazosin  (CARDURA ) 4 MG tablet Take  1 tablet (4 mg total) by mouth daily. 90 tablet 1   DULoxetine  (CYMBALTA ) 60 MG capsule TAKE 1 CAPSULE(60 MG) BY MOUTH DAILY 90 capsule 3   esomeprazole  (NEXIUM ) 40 MG capsule Take 1 capsule (40 mg total) by mouth daily at 12 noon. 90 capsule 3   finasteride  (PROSCAR ) 5 MG tablet TAKE 1 TABLET(5 MG) BY MOUTH AT BEDTIME (Patient taking differently: 5 mg at bedtime as needed. TAKE 1 TABLET(5 MG) BY MOUTH AT BEDTIME) 90 tablet 3   furosemide  (LASIX ) 40 MG tablet TAKE 1  TABLET(40 MG) BY MOUTH DAILY 90 tablet 1   gentamicin  cream (GARAMYCIN ) 0.1 % Apply 1 application. topically 2 (two) times daily. 30 g 1   insulin  degludec ([name]  FLEXTOUCH) 200 UNIT/ML FlexTouch Pen Inject 40 Units into the skin daily. 15 mL 3   Insulin  Pen Needle (NOVOFINE PEN NEEDLE) 32G X 6 MM MISC 1 each by Does not apply route 2 (two) times daily. 1 each 3   levothyroxine  (SYNTHROID ) 100 MCG tablet TAKE 1 TABLET(100 MCG) BY MOUTH DAILY BEFORE BREAKFAST 90 tablet 3   loratadine  (CLARITIN ) 10 MG tablet Take 10 mg by mouth daily as needed for allergies.     losartan  (COZAAR ) 25 MG tablet TAKE 1 TABLET BY MOUTH EVERY MORNING THEN TAKE 2 TABLETS BY MOUTH EVERY EVENING 270 tablet 0   MAGNESIUM  PO Take 400 mg by mouth daily at 8 pm. 1 tab in the a.m. and 2 at bedtime     mometasone  (NASONEX ) 50 MCG/ACT nasal spray Place 2 sprays into the nose daily. 1 each 12   Multiple Vitamins-Minerals (MULTIVITAMIN MEN PO) Take 1 tablet by mouth daily.     Needle, Disp, (HYPODERMIC NEEDLE 18GX1) 18G X 1 MISC Use every 14 days to draw up testosterone      Needle, Disp, (HYPODERMIC NEEDLE 25GX1) 25G X 1 MISC Use every 14 days to administer testosterone      pravastatin  (PRAVACHOL ) 20 MG tablet Take 1 tablet (20 mg total) by mouth daily. 90 tablet 3   Semaglutide , 1 MG/DOSE, (OZEMPIC , 1 MG/DOSE,) 4 MG/3ML SOPN Inject 1 mg into the skin once a week. 3 mL 3   tadalafil (CIALIS) 20 MG tablet Take by mouth.     testosterone  cypionate (DEPOTESTOSTERONE CYPIONATE) 200 MG/ML injection Inject 200 mg into the muscle every 14 (fourteen) days.     No current facility-administered medications on file prior to visit.     ROS see history of present illness  Objective  Physical Exam Vitals:   10/01/23 0957  BP: 124/78  Pulse: 76  Resp: 20  Temp: 97.8 F (36.6 C)  SpO2: 98%    BP Readings from Last 3 Encounters:  10/01/23 124/78  09/25/23 (!) 163/94  09/24/23 (!) 122/90   Wt Readings from Last 3  Encounters:  10/01/23 259 lb (117.5 kg)  09/25/23 260 lb (117.9 kg)  08/31/23 265 lb (120.2 kg)    Physical Exam Constitutional:      General: He is not in acute distress.    Appearance: Normal appearance.  HENT:     Head: Normocephalic.   Cardiovascular:     Rate and Rhythm: Normal rate and regular rhythm.     Heart sounds: Normal heart sounds.  Pulmonary:     Effort: Pulmonary effort is normal.     Breath sounds: Normal breath sounds.   Skin:    General: Skin is warm and dry.   Neurological:     General: No focal deficit present.  Mental Status: He is alert.   Psychiatric:        Mood and Affect: Mood normal.        Behavior: Behavior normal.      Assessment/Plan: Please see individual problem list.  DM type 2 with diabetic peripheral neuropathy (HCC) Assessment & Plan: Type 2 diabetes with an A1c of 7.9%. He is on Ozempic , [name] , and [name] , with no hypoglycemia but reports excessive thirst. Check A1c today. Continue current medications and encourage dietary modifications to reduce sweets intake.  Orders: -     Hemoglobin A1c  Chronic painful diabetic neuropathy (HCC) Assessment & Plan: Peripheral neuropathy with pain managed by pregabalin , though gaps in pain control are reported. Increase pregabalin  to 225 mg twice daily.  PDMP reviewed.   Orders: -     Pregabalin ; Take 1 capsule (225 mg total) by mouth 2 (two) times daily.  Dispense: 180 capsule; Refill: 1  Iron deficiency Assessment & Plan: Iron deficiency with no gastrointestinal bleeding source found. He is off oral iron due to procedures and experiences constipation from oral iron. Recheck iron levels today. Pick up stool cards for weekly testing and send them back to the GI specialist.  Orders: -     IBC + Ferritin -     CBC with Differential/Platelet  Chronic gout due to renal impairment involving foot without tophus, unspecified laterality Assessment & Plan: Recurrent gout with recent  flare showing improvement. Carvedilol  may increase colchicine  levels, and restarting allopurinol  may cause a flare. Prescribe colchicine  with instructions to take two tablets now, one more in an hour, and then one tablet daily for a few days. Recheck uric acid levels today. Consider increasing allopurinol  if uric acid levels remain high.  Orders: -     Uric acid -     Colchicine ; Take 2 tablets by mouth now x 1 dose then take 1 tablet by mouth one hour later  Dispense: 20 tablet; Refill: 0  Essential hypertension Assessment & Plan: Hypertension is well-controlled with losartan , doxazosin , and carvedilol . Continue current medication regimen.    Hyperlipidemia, unspecified hyperlipidemia type Assessment & Plan: Hyperlipidemia is managed with pravastatin , with no side effects reported, though generalized aches are noted. Recheck cholesterol levels today and continue pravastatin .  Orders: -     Lipid panel -     Comprehensive metabolic panel with GFR  Anxiety and depression Assessment & Plan: Stable on Cymbalta , though he reports ill-temper and activity limitations. Continue Cymbalta  60 mg daily.   Chronic back pain greater than 3 months duration -     Cyclobenzaprine  HCl; Take 1 tablet (10 mg total) by mouth 3 (three) times daily as needed.  Dispense: 30 tablet; Refill: 2      Return in about 3 months (around 01/01/2024) for Follow up.   Leron Glance, NP-C [facility]

## 2023-10-02 ENCOUNTER — Ambulatory Visit: Payer: Self-pay | Admitting: Nurse Practitioner

## 2023-10-05 ENCOUNTER — Ambulatory Visit
Admission: RE | Admit: 2023-10-05 | Discharge: 2023-10-05 | Disposition: A | Discharge status: Home / Self Care | Source: Ambulatory Visit | Attending: Family Medicine | Admitting: Family Medicine

## 2023-10-05 DIAGNOSIS — M25512 Pain in left shoulder: Secondary | ICD-10-CM

## 2023-10-06 ENCOUNTER — Telehealth: Payer: Self-pay

## 2023-10-06 NOTE — Telephone Encounter (Signed)
 Pt and pt's wife notified of pen needles being ready for pick up.  Novofine 2 boxes onu:[id number] EXP: 01/12/28

## 2023-10-07 ENCOUNTER — Ambulatory Visit: Payer: Self-pay | Admitting: Family Medicine

## 2023-10-07 ENCOUNTER — Encounter: Payer: Self-pay | Admitting: Nurse Practitioner

## 2023-10-07 NOTE — Assessment & Plan Note (Signed)
 Stable on Cymbalta , though he reports ill-temper and activity limitations. Continue Cymbalta  60 mg daily.

## 2023-10-07 NOTE — Assessment & Plan Note (Signed)
 Hypertension is well-controlled with losartan , doxazosin , and carvedilol . Continue current medication regimen.

## 2023-10-07 NOTE — Assessment & Plan Note (Signed)
 Iron deficiency with no gastrointestinal bleeding source found. He is off oral iron due to procedures and experiences constipation from oral iron. Recheck iron levels today. Pick up stool cards for weekly testing and send them back to the GI specialist.

## 2023-10-07 NOTE — Assessment & Plan Note (Signed)
 Peripheral neuropathy with pain managed by pregabalin , though gaps in pain control are reported. Increase pregabalin  to 225 mg twice daily.  PDMP reviewed.

## 2023-10-07 NOTE — Assessment & Plan Note (Signed)
 Recurrent gout with recent flare showing improvement. Carvedilol  may increase colchicine  levels, and restarting allopurinol  may cause a flare. Prescribe colchicine  with instructions to take two tablets now, one more in an hour, and then one tablet daily for a few days. Recheck uric acid levels today. Consider increasing allopurinol  if uric acid levels remain high.

## 2023-10-07 NOTE — Assessment & Plan Note (Signed)
 Type 2 diabetes with an A1c of 7.9%. He is on Ozempic , [name] , and [name] , with no hypoglycemia but reports excessive thirst. Check A1c today. Continue current medications and encourage dietary modifications to reduce sweets intake.

## 2023-10-07 NOTE — Assessment & Plan Note (Signed)
 Hyperlipidemia is managed with pravastatin , with no side effects reported, though generalized aches are noted. Recheck cholesterol levels today and continue pravastatin .

## 2023-10-09 ENCOUNTER — Ambulatory Visit (HOSPITAL_COMMUNITY)

## 2023-10-10 ENCOUNTER — Other Ambulatory Visit: Payer: Self-pay | Admitting: Internal Medicine

## 2023-10-10 DIAGNOSIS — L03039 Cellulitis of unspecified toe: Secondary | ICD-10-CM

## 2023-10-12 NOTE — Telephone Encounter (Signed)
 NOT [facility] PATIENT

## 2023-10-14 NOTE — Addendum Note (Signed)
 Addended by: FROYLAN SOR on: 10/14/2023 11:41 AM   Modules accepted: Orders

## 2023-10-20 ENCOUNTER — Ambulatory Visit (HOSPITAL_COMMUNITY)

## 2023-10-22 ENCOUNTER — Other Ambulatory Visit: Payer: Self-pay | Admitting: Nurse Practitioner

## 2023-10-22 DIAGNOSIS — I10 Essential (primary) hypertension: Secondary | ICD-10-CM

## 2023-10-22 MED ORDER — DOXAZOSIN MESYLATE 4 MG PO TABS: 4.0000 mg | ORAL_TABLET | Freq: Every day | ORAL | 3 refills | Status: AC

## 2023-10-22 NOTE — Telephone Encounter (Signed)
 Copied from CRM [id number]. Topic: Clinical - Medication Refill >> Oct 22, 2023 12:24 PM [place] A wrote: Medication: doxazosin  (CARDURA ) 4 MG tablet  Has the patient contacted their pharmacy? Yes (Agent: If no, request that the patient contact the pharmacy for the refill. If patient does not wish to contact the pharmacy document the reason why and proceed with request.) (Agent: If yes, when and what did the pharmacy advise?) Told patient there were no refills  This is the patient's preferred pharmacy:  [id number] [place], [place] - [address] AT [facility] [address] [place] [address] Phone: [phone] Fax: [phone]   Is this the correct pharmacy for this prescription? Yes If no, delete pharmacy and type the correct one.   Has the prescription been filled recently? No  Is the patient out of the medication? Yes  Has the patient been seen for an appointment in the last year OR does the patient have an upcoming appointment? Yes  Can we respond through MyChart? No  Agent: Please be advised that Rx refills may take up to 3 business days. We ask that you follow-up with your pharmacy.

## 2023-10-26 ENCOUNTER — Other Ambulatory Visit (HOSPITAL_COMMUNITY): Payer: Self-pay

## 2023-10-26 ENCOUNTER — Telehealth: Payer: Self-pay

## 2023-10-26 NOTE — Telephone Encounter (Signed)
 Pharmacy Patient Advocate Encounter   Received notification from CoverMyMeds that prior authorization for Cyclobenzaprine  HCl 10MG  tablets  is required/requested.   Insurance verification completed.   The patient is insured through [facility] ADVANTAGE/RX ADVANCE .   Per test claim: PA required; PA submitted to above mentioned insurance via CoverMyMeds Key/confirmation #/EOC [id number] Status is pending

## 2023-10-28 ENCOUNTER — Ambulatory Visit

## 2023-10-29 NOTE — Telephone Encounter (Signed)
 Pharmacy Patient Advocate Encounter  Received notification from [facility] ADVANTAGE/RX ADVANCE that Prior Authorization for  Cyclobenzaprine  HCl 10MG  tablets  has been DENIED.  Full denial letter will be uploaded to the media tab. See denial reason below.

## 2023-11-03 ENCOUNTER — Telehealth: Payer: Self-pay

## 2023-11-03 NOTE — Telephone Encounter (Signed)
 Form faxed to [phone] with a completed transmission log

## 2023-11-03 NOTE — Telephone Encounter (Signed)
 NOVO NORDISK refill form received need provider signature. Form placed in provider to be signed folder.

## 2023-11-04 ENCOUNTER — Other Ambulatory Visit: Payer: Self-pay | Admitting: Family Medicine

## 2023-11-04 ENCOUNTER — Other Ambulatory Visit (HOSPITAL_COMMUNITY): Payer: Self-pay | Admitting: Medical

## 2023-11-04 ENCOUNTER — Ambulatory Visit (HOSPITAL_COMMUNITY)
Admission: RE | Admit: 2023-11-04 | Discharge: 2023-11-04 | Disposition: A | Discharge status: Home / Self Care | Source: Ambulatory Visit | Attending: Family Medicine | Admitting: Family Medicine

## 2023-11-04 DIAGNOSIS — M79605 Pain in left leg: Secondary | ICD-10-CM

## 2023-11-04 DIAGNOSIS — M67912 Unspecified disorder of synovium and tendon, left shoulder: Secondary | ICD-10-CM

## 2023-11-04 DIAGNOSIS — M25512 Pain in left shoulder: Secondary | ICD-10-CM

## 2023-11-04 DIAGNOSIS — M79674 Pain in right toe(s): Secondary | ICD-10-CM

## 2023-11-04 DIAGNOSIS — M79604 Pain in right leg: Secondary | ICD-10-CM | POA: Diagnosis present

## 2023-11-04 DIAGNOSIS — M25572 Pain in left ankle and joints of left foot: Secondary | ICD-10-CM

## 2023-11-05 ENCOUNTER — Other Ambulatory Visit: Payer: Self-pay | Admitting: Nurse Practitioner

## 2023-11-05 LAB — VAS US LOWER EXT ART SEG MULTI (SEGMENTALS & LE RAYNAUDS)
Left ABI: 1.12
Right ABI: 1.2

## 2023-11-09 ENCOUNTER — Ambulatory Visit (INDEPENDENT_AMBULATORY_CARE_PROVIDER_SITE_OTHER): Admitting: *Deleted

## 2023-11-09 ENCOUNTER — Telehealth: Payer: Self-pay | Admitting: *Deleted

## 2023-11-09 VITALS — Ht 71.0 in | Wt 265.0 lb

## 2023-11-09 DIAGNOSIS — Z0000 Encounter for general adult medical examination without abnormal findings: Secondary | ICD-10-CM | POA: Diagnosis not present

## 2023-11-09 NOTE — Progress Notes (Signed)
 Subjective:   Douglas [name] is a 78 y.o. who presents for a Medicare Wellness preventive visit.  As a reminder, Annual Wellness Visits don't include a physical exam, and some assessments may be limited, especially if this visit is performed virtually. We may recommend an in-person follow-up visit with your provider if needed.  Visit Complete: Virtual I connected with  Charlie LITTIE Coonrod on 11/09/23 by a audio enabled telemedicine application and verified that I am speaking with the correct person using two identifiers.  Patient Location: Home  Provider Location: Home Office  I discussed the limitations of evaluation and management by telemedicine. The patient expressed understanding and agreed to proceed.  Vital Signs: Because this visit was a virtual/telehealth visit, some criteria may be missing or patient reported. Any vitals not documented were not able to be obtained and vitals that have been documented are patient reported.  VideoDeclined- This patient declined Librarian, academic. Therefore the visit was completed with audio only.  Persons Participating in Visit: Patient.  AWV Questionnaire: No: Patient Medicare AWV questionnaire was not completed prior to this visit.  Cardiac Risk Factors include: advanced age (>60men, >30 women);diabetes mellitus;male gender;obesity (BMI >30kg/m2);dyslipidemia;hypertension     Objective:    Today's Vitals   11/09/23 1524  Weight: 265 lb (120.2 kg)  Height: 5' 11 (1.803 m)  PainSc: 7    Body mass index is 36.96 kg/m.     11/09/2023    3:58 PM 08/31/2023    8:59 AM 07/01/2023    2:23 PM 06/03/2023    9:52 AM 08/19/2022    1:13 PM 09/10/2021   12:47 PM 09/05/2020   11:44 AM  Advanced Directives  Does Patient Have a Medical Advance Directive? Yes Yes Yes Yes Yes Yes Yes  Type of Estate agent of [place];Living will    Healthcare Power of [place];Living will Healthcare Power of  [place];Living will Healthcare Power of [place];Living will  Does patient want to make changes to medical advance directive?      No - Patient declined No - Patient declined  Copy of Healthcare Power of Attorney in Chart? No - copy requested    No - copy requested No - copy requested No - copy requested    Current Medications (verified) Outpatient Encounter Medications as of 11/09/2023  Medication Sig   acetaminophen  (TYLENOL ) 500 MG tablet Take 500 mg by mouth every 6 (six) hours as needed.   aspirin  81 MG tablet Take 1 tablet (81 mg total) by mouth daily. (Patient taking differently: Take 325 mg by mouth daily.)   carvedilol  (COREG ) 6.25 MG tablet TAKE 1 TABLET(6.25 MG) BY MOUTH TWICE DAILY WITH A MEAL   Cholecalciferol (VITAMIN D3 PO) Take by mouth daily.   clove oil liquid Apply 1 application topically as needed.   colchicine  0.6 MG tablet Take 2 tablets by mouth now x 1 dose then take 1 tablet by mouth one hour later   cyanocobalamin  (VITAMIN B12) 1000 MCG/ML injection Inject 1,000 mcg into the muscle every 30 (thirty) days.   dapagliflozin  propanediol ([name] ) 10 MG TABS tablet Take 1 tablet (10 mg total) by mouth daily.   doxazosin  (CARDURA ) 4 MG tablet Take 1 tablet (4 mg total) by mouth daily.   DULoxetine  (CYMBALTA ) 60 MG capsule TAKE 1 CAPSULE(60 MG) BY MOUTH DAILY   esomeprazole  (NEXIUM ) 40 MG capsule Take 1 capsule (40 mg total) by mouth daily at 12 noon.   finasteride  (PROSCAR ) 5 MG tablet TAKE  1 TABLET(5 MG) BY MOUTH AT BEDTIME   furosemide  (LASIX ) 40 MG tablet TAKE 1 TABLET(40 MG) BY MOUTH DAILY   hydroxypropyl methylcellulose / hypromellose (ISOPTO TEARS / GONIOVISC) 2.5 % ophthalmic solution 1 drop as needed for dry eyes.   insulin  degludec ([name]  FLEXTOUCH) 200 UNIT/ML FlexTouch Pen Inject 40 Units into the skin daily. (Patient taking differently: Inject 44 Units into the skin daily.)   Insulin  Pen Needle (NOVOFINE PEN NEEDLE) 32G X 6 MM MISC 1 each by Does not apply  route 2 (two) times daily.   levothyroxine  (SYNTHROID ) 100 MCG tablet TAKE 1 TABLET(100 MCG) BY MOUTH DAILY BEFORE BREAKFAST   loratadine  (CLARITIN ) 10 MG tablet Take 10 mg by mouth daily as needed for allergies.   losartan  (COZAAR ) 25 MG tablet TAKE 1 TABLET BY MOUTH EVERY MORNING THEN TAKE 2 TABLETS BY MOUTH EVERY EVENING   MAGNESIUM  PO Take 400 mg by mouth daily at 8 pm. 1 tab in the a.m. and 2 at bedtime   mometasone  (NASONEX ) 50 MCG/ACT nasal spray Place 2 sprays into the nose daily. (Patient taking differently: Place 2 sprays into the nose daily as needed.)   naproxen sodium (ALEVE) 220 MG tablet Take 220 mg by mouth 2 (two) times daily as needed.   Needle, Disp, (HYPODERMIC NEEDLE 18GX1) 18G X 1 MISC Use every 14 days to draw up testosterone    Needle, Disp, (HYPODERMIC NEEDLE 25GX1) 25G X 1 MISC Use every 14 days to administer testosterone    pravastatin  (PRAVACHOL ) 20 MG tablet Take 1 tablet (20 mg total) by mouth daily.   pregabalin  (LYRICA ) 225 MG capsule Take 1 capsule (225 mg total) by mouth 2 (two) times daily.   Semaglutide , 1 MG/DOSE, (OZEMPIC , 1 MG/DOSE,) 4 MG/3ML SOPN Inject 1 mg into the skin once a week.   tadalafil (CIALIS) 20 MG tablet Take by mouth.   testosterone  cypionate (DEPOTESTOSTERONE CYPIONATE) 200 MG/ML injection Inject 200 mg into the muscle every 14 (fourteen) days.   B Complex Vitamins (BL VITAMIN B COMPLEX PO) Take by mouth daily. (Patient not taking: Reported on 11/09/2023)   calcitRIOL  (ROCALTROL ) 0.25 MCG capsule Take 1 capsule (0.25 mcg total) by mouth daily. (Patient not taking: Reported on 11/09/2023)   cyclobenzaprine  (FLEXERIL ) 10 MG tablet Take 1 tablet (10 mg total) by mouth 3 (three) times daily as needed. (Patient not taking: Reported on 11/09/2023)   gentamicin  cream (GARAMYCIN ) 0.1 % Apply 1 application. topically 2 (two) times daily. (Patient not taking: Reported on 11/09/2023)   Multiple Vitamins-Minerals (MULTIVITAMIN MEN PO) Take 1 tablet by mouth  daily. (Patient not taking: Reported on 11/09/2023)   No facility-administered encounter medications on file as of 11/09/2023.    Allergies (verified) Bee venom, Benadryl [diphenhydramine hcl (sleep)], Enalapril, Gabapentin, Hctz [hydrochlorothiazide], and Omeprazole   History: Past Medical History:  Diagnosis Date   Arthritis    Depression    Diabetes mellitus with complication (HCC)    Diabetic neuropathy (HCC)    Diastolic dysfunction    a. TTE 07/2017: EF 60-65%, mild concentric LVH, no RWMA, Gr1DD, trivial AI, mildly dilated LA, RVSF normal, PASP normal   Edema    feet/legs   GERD (gastroesophageal reflux disease)    Gout    History of stress test    a. MV 06/2017: small in size, mild in severity, apical anterior and apical defect that was minimally reversible and most likely represented artifact and less likely ischemia/scar, LVEF 55-65%, low risk, probably normal stress test   Hypertension  Hypothyroidism    Kidney cysts    renal failure 2013   Kidney stones    Dr. Waddell   OSA (obstructive sleep apnea)    No oxygen at all in 2-3 years.   [place]y [place] spotted fever 12/10/2017   Positve IgG in titer on 11/20/17   S/P cardiac cath 1998   a. no obstructive disease   Syncope and collapse    Temporary low platelet count (HCC)    Past Surgical History:  Procedure Laterality Date   ANAL FISSURE REPAIR     BACK SURGERY  1977   rupture disc lumbar spine   CARDIAC CATHETERIZATION  1998   [facility].    CATARACT EXTRACTION W/PHACO Right 07/16/2016   Procedure: CATARACT EXTRACTION PHACO AND INTRAOCULAR LENS PLACEMENT (IOC);  Surgeon: Steven Dingeldein, MD;  Location: [facility] ORS;  Service: Ophthalmology;  Laterality: Right;  US  01:11 AP% 17.6 CDE 24.83 fluid pack lot # [id number]   CATARACT EXTRACTION W/PHACO Left 08/13/2016   Procedure: CATARACT EXTRACTION PHACO AND INTRAOCULAR LENS PLACEMENT (IOC) suture placed in left eye at end of  procedure;  Surgeon: Steven Dingeldein, MD;  Location: [facility] ORS;  Service: Ophthalmology;  Laterality: Left;  US  01:55 AP% 22.7 CDE 53.56 fluid pack lot # [id number]   FINGER AMPUTATION     partial   FINGER ARTHROPLASTY     KNEE SURGERY  1993   arthroscopy   SHOULDER SURGERY Bilateral 1998   arthroscopic right, rotator cuff repair left   Family History  Problem Relation Age of Onset   Breast cancer Mother    Lung cancer Mother    Bone cancer Mother    Heart Problems Mother    Cancer Mother        breast, lung and rib   AAA (abdominal aortic aneurysm) Mother    Heart disease Mother    Heart attack Father    Heart disease Father    Cancer Brother        esophageal   Heart attack Brother    Colon cancer Neg Hx    Liver disease Neg Hx    Social History   Socioeconomic History   Marital status: Married    Spouse name: [place]    Number of children: 3   Years of education: 13   Highest education level: Not on file  Occupational History    Comment: retired  Tobacco Use   Smoking status: Never   Smokeless tobacco: Never  Vaping Use   Vaping status: Never Used  Substance and Sexual Activity   Alcohol use: Not Currently   Drug use: No   Sexual activity: Yes  Other Topics Concern   Not on file  Social History Narrative   Lives in [place] with his wife ([place]ia ). No children. Three step children. No pets.      Work - Patient is retired. Maintenance supervisor.      School - One year college education.      Right handed.      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               Social Drivers of Health   Financial Resource Strain: Low Risk  (11/09/2023)   Overall Financial Resource Strain (CARDIA)    Difficulty of Paying Living Expenses: Not hard at all  Food Insecurity: No Food Insecurity (11/09/2023)   Hunger Vital Sign    Worried About Running Out of Food in the Last  Year: Never true    Ran Out of Food in the Last Year: Never true   Transportation Needs: No Transportation Needs (11/09/2023)   PRAPARE - Administrator, Civil Service (Medical): No    Lack of Transportation (Non-Medical): No  Physical Activity: Inactive (11/09/2023)   Exercise Vital Sign    Days of Exercise per Week: 0 days    Minutes of Exercise per Session: 0 min  Stress: No Stress Concern Present (11/09/2023)   [employer] of Occupational Health - Occupational Stress Questionnaire    Feeling of Stress: Not at all  Social Connections: Moderately Integrated (11/09/2023)   Social Connection and Isolation Panel    Frequency of Communication with Friends and Family: More than three times a week    Frequency of Social Gatherings with Friends and Family: More than three times a week    Attends Religious Services: More than 4 times per year    Active Member of [employer] or Organizations: No    Attends Engineer, structural: Never    Marital Status: Married    Tobacco Counseling Counseling given: Not Answered    Clinical Intake:  Pre-visit preparation completed: Yes  Pain : 0-10 Pain Score: 7  Pain Type: Chronic pain Pain Location: Foot Pain Orientation: Left Pain Descriptors / Indicators: Burning, Cramping Pain Onset: More than a month ago Pain Frequency: Constant     BMI - recorded: 36.96 Nutritional Status: BMI > 30  Obese Nutritional Risks: Nausea/ vomitting/ diarrhea (little nausea, family has had a stomach bug, he is doing fine) Diabetes: Yes CBG done?: No Did pt. bring in CBG monitor from home?: No  Lab Results  Component Value Date   HGBA1C 7.5 (H) 10/01/2023   HGBA1C 7.9 (H) 07/01/2023   HGBA1C 8.0 (H) 04/01/2023     How often do you need to have someone help you when you read instructions, pamphlets, or other written materials from your doctor or pharmacy?: 1 - Never  Interpreter Needed?: No  Information entered by :: R. Alcide Memoli LPN   Activities of Daily Living     11/09/2023    3:31 PM  In your  present state of health, do you have any difficulty performing the following activities:  Hearing? 1  Vision? 0  Comment readers  Difficulty concentrating or making decisions? 1  Walking or climbing stairs? 1  Dressing or bathing? 0  Doing errands, shopping? 1  Preparing Food and eating ? N  Using the Toilet? N  In the past six months, have you accidently leaked urine? Y  Do you have problems with loss of bowel control? Y  Managing your Medications? N  Managing your Finances? N  Housekeeping or managing your Housekeeping? Y    Patient Care Team: Gretel App, NP as PCP - General (Nurse Practitioner) Darron Deatrice LABOR, MD as PCP - Cardiology (Cardiology) Marcelino Gales, MD (Internal Medicine) Waddell Thom SAUNDERS, PA (Inactive) as Physician Assistant (Physician Assistant)  I have updated your Care Teams any recent Medical Services you may have received from other providers in the past year.     Assessment:   This is a routine wellness examination for Janard.  Hearing/Vision screen Hearing Screening - Comments:: Some issues Vision Screening - Comments:: readers   Goals Addressed             This Visit's Progress    Patient Stated       Wants to be able to get back on his feet and  do more things outside       Depression Screen     11/09/2023    3:49 PM 08/31/2023    8:59 AM 08/24/2023    2:57 PM 07/01/2023    2:24 PM 07/01/2023    9:55 AM 06/03/2023    9:52 AM 05/19/2023   10:43 AM  PHQ 2/9 Scores  PHQ - 2 Score 2 0 4 0 1 4 2   PHQ- 9 Score 11  14  7     Exception Documentation      Patient refusal     Fall Risk     11/09/2023    3:35 PM 08/31/2023    8:59 AM 08/24/2023    2:57 PM 07/01/2023    2:23 PM 06/03/2023    9:52 AM  Fall Risk   Falls in the past year? 1 0 0 1 1  Number falls in past yr: 1  0 0 0  Injury with Fall? 1  0 0 0  Risk for fall due to : History of fall(s);Impaired balance/gait  No Fall Risks    Follow up Falls evaluation completed;Falls prevention  discussed  Falls evaluation completed      MEDICARE RISK AT HOME:  Medicare Risk at Home Any stairs in or around the home?: Yes If so, are there any without handrails?: Yes Home free of loose throw rugs in walkways, pet beds, electrical cords, etc?: Yes Adequate lighting in your home to reduce risk of falls?: Yes Life alert?: No Use of a cane, walker or w/c?: No Grab bars in the bathroom?: Yes Shower chair or bench in shower?: Yes Elevated toilet seat or a handicapped toilet?: Yes  TIMED UP AND GO:  Was the test performed?  No  Cognitive Function: 6CIT completed    07/03/2017   10:54 AM 07/02/2016   10:41 AM 07/03/2015   11:01 AM  MMSE - Mini Mental State Exam  Orientation to time 5 5  5    Orientation to Place 5 5  5    Registration 3 3  3    Attention/ Calculation 5 5  5    Recall 3 3  3    Language- name 2 objects 2 2  2    Language- repeat 1 1 1   Language- follow 3 step command 3 3  3    Language- read & follow direction 1 1  1    Write a sentence 1 1  1    Copy design 1 1  1    Total score 30 30  30       Data saved with a previous flowsheet row definition        11/09/2023    3:58 PM 08/19/2022    1:09 PM 09/10/2021   12:52 PM 09/05/2019    1:30 PM 08/09/2018    4:19 PM  6CIT Screen  What Year? 0 points 0 points 0 points 0 points 0 points  What month? 0 points 0 points 0 points 0 points 0 points  What time? 0 points 0 points 0 points  0 points  Count back from 20 0 points 0 points 0 points  0 points  Months in reverse 0 points 0 points 0 points 0 points 0 points  Repeat phrase 0 points 0 points 0 points  0 points  Total Score 0 points 0 points 0 points  0 points    Immunizations Immunization History  Administered Date(s) Administered   Fluad Quad(high Dose 65+) 04/21/2019, 03/02/2020, 12/19/2020, 03/05/2022   Influenza, High Dose Seasonal PF 02/15/2016,  02/16/2017, 03/01/2018   Influenza,inj,Quad PF,6+ Mos 01/04/2013, 02/20/2014, 02/27/2015   Moderna Sars-Covid-2  Vaccination 10/10/2019, 11/07/2019   Pneumococcal Conjugate-13 05/02/2013   Pneumococcal Polysaccharide-23 01/05/2011   Tdap 01/05/2008    Screening Tests Health Maintenance  Topic Date Due   Diabetic kidney evaluation - Urine ACR  Never done   Zoster Vaccines- Shingrix (1 of 2) Never done   DTaP/Tdap/Td (2 - Td or Tdap) 01/04/2018   OPHTHALMOLOGY EXAM  03/12/2021   Medicare Annual Wellness (AWV)  08/19/2023   INFLUENZA VACCINE  11/13/2023   FOOT EXAM  03/31/2024   HEMOGLOBIN A1C  04/01/2024   Diabetic kidney evaluation - eGFR measurement  09/30/2024   Pneumococcal Vaccine: 50+ Years  Completed   Hepatitis C Screening  Completed   Hepatitis B Vaccines  Aged Out   HPV VACCINES  Aged Out   Meningococcal B Vaccine  Aged Out   COVID-19 Vaccine  Discontinued    Health Maintenance  Health Maintenance Due  Topic Date Due   Diabetic kidney evaluation - Urine ACR  Never done   Zoster Vaccines- Shingrix (1 of 2) Never done   DTaP/Tdap/Td (2 - Td or Tdap) 01/04/2018   OPHTHALMOLOGY EXAM  03/12/2021   Medicare Annual Wellness (AWV)  08/19/2023   Health Maintenance Items Addressed: Discussed the need to update Tetanus and shingles vaccines he declines at this time.  Additional Screening:  Vision Screening: Recommended annual ophthalmology exams for early detection of glaucoma and other disorders of the eye. Up to date  [facility] will send for office notes Would you like a referral to an eye doctor? No    Dental Screening: Recommended annual dental exams for proper oral hygiene  Community Resource Referral / Chronic Care Management: CRR required this visit?  No   CCM required this visit?  No   Plan:    I have personally reviewed and noted the following in the patient's chart:   Medical and social history Use of alcohol, tobacco or illicit drugs  Current medications and supplements including opioid prescriptions. Patient is not currently taking opioid  prescriptions. Functional ability and status Nutritional status Physical activity Advanced directives List of other physicians Hospitalizations, surgeries, and ER visits in previous 12 months Vitals Screenings to include cognitive, depression, and falls Referrals and appointments  In addition, I have reviewed and discussed with patient certain preventive protocols, quality metrics, and best practice recommendations. A written personalized care plan for preventive services as well as general preventive health recommendations were provided to patient.   Angeline Fredericks, LPN   2/71/7974   After Visit Summary: (Pick Up) Due to this being a telephonic visit, with patients personalized plan was offered to patient and patient has requested to Pick up at office.  Notes: Nothing significant to report at this time.  Phone note sent to PCP

## 2023-11-09 NOTE — Patient Instructions (Addendum)
 Douglas [name] , Thank you for taking time out of your busy schedule to complete your Annual Wellness Visit with me. I enjoyed our conversation and look forward to speaking with you again next year. I, as well as your care team,  appreciate your ongoing commitment to your health goals. Please review the following plan we discussed and let me know if I can assist you in the future. Your Game plan/ To Do List    Referrals: If you haven't heard from the office you've been referred to, please reach out to them at the phone provided.  Consider updating your tetanus and shingles vaccines at your pharmacy Follow up Visits: Next Medicare AWV with our clinical staff: 11/14/24 @ 1:00   Have you seen your provider in the last 6 months (3 months if uncontrolled diabetes)? Yes Next Office Visit with your provider: 12/31/23  Clinician Recommendations:  Aim for 30 minutes of exercise or brisk walking, 6-8 glasses of water, and 5 servings of fruits and vegetables each day.       This is a list of the screening recommended for you and due dates:  Health Maintenance  Topic Date Due   Yearly kidney health urinalysis for diabetes  Never done   Zoster (Shingles) Vaccine (1 of 2) Never done   DTaP/Tdap/Td vaccine (2 - Td or Tdap) 01/04/2018   Eye exam for diabetics  03/12/2021   Flu Shot  11/13/2023   Complete foot exam   03/31/2024   Hemoglobin A1C  04/01/2024   Yearly kidney function blood test for diabetes  09/30/2024   Medicare Annual Wellness Visit  11/08/2024   Pneumococcal Vaccine for age over 41  Completed   Hepatitis C Screening  Completed   Hepatitis B Vaccine  Aged Out   HPV Vaccine  Aged Out   Meningitis B Vaccine  Aged Out   COVID-19 Vaccine  Discontinued    Advanced directives: (Copy Requested) Please bring a copy of your health care power of attorney and living will to the office to be added to your chart at your convenience. You can mail to [facility] [address]  [place], [place] [address] or email to [email or web address] Advance Care Planning is important because it:  [x]  Makes sure you receive the medical care that is consistent with your values, goals, and preferences  [x]  It provides guidance to your family and loved ones and reduces their decisional burden about whether or not they are making the right decisions based on your wishes.

## 2023-11-09 NOTE — Telephone Encounter (Addendum)
 Performed AWV Please review depression screening. Patient stated that you are aware of him feeling depressed. Patient stated the depression comes from the fact he has so much problems with his legs and feet. Patient stated that he would not harm himself or anyone else. Patient has a follow-up appointment  scheduled with you 12/31/23.  Patient was offered an appointment sooner which he declined.

## 2023-11-19 ENCOUNTER — Telehealth: Payer: Self-pay

## 2023-11-19 NOTE — Telephone Encounter (Signed)
 Spoke to pt in regards to his pt assistance medication Pin Needles he stated  he will send his wife up here to pick it up

## 2023-11-25 ENCOUNTER — Telehealth: Payer: Self-pay | Admitting: *Deleted

## 2023-11-25 NOTE — Telephone Encounter (Signed)
 Wife stated that this was picked up already.

## 2023-11-25 NOTE — Telephone Encounter (Signed)
 Patient notified that Patient Assistance Medication are in the office & are ready for pick up.   Medication: Ozempic  8mg /62ml  Quantity: 4 boxes  Lot# [id number]  Exp: 04/13/2026

## 2023-12-02 NOTE — Progress Notes (Unsigned)
 [name] [address] [place] [address] Phone: [phone] Subjective:   [name], am serving as a scribe for Dr. Arthea Claudene.  I'm seeing this patient by the request  of:  Gretel App, NP  CC: Shoulder and knee pain follow-up  YEP:Dlagzrupcz  09/24/2023 Patient does have some neuropathy and does have spinal stenosis of the lower back that could be more of a neurogenic claudication but I am concerned for potential blood vessel pathology that could be contributing and ABI ordered.  Patient does have a rigid midfoot and we discussed more supportive shoes including rigid bottom shoes as well as recovery sandals in the house.  Patient may need custom orthotics if this does not seem to make improvement.  Follow-up again in 6 to 8 weeks otherwise.     Failed all conservative therapy, has had this now nearly 2 years and intermittently was responding to injections.  Postsurgical changes are noted on ultrasound and I do feel that advanced imaging is warranted at this time.  Discussed icing regimen of home exercises otherwise but I do feel an MRI without contrast to further evaluate if any surgical intervention is needed with patient now having difficulty with daily activities.  Once again failed conservative therapy including injections and formal physical therapy.     Update 12/03/2023 MENDEL BINSFELD is a 78 y.o. male coming in with complaint of L shoulder and L knee pain. Patient states that he has been using hydrocodone  for nerve pain. No longer having numbness down his arm.   B knee pain restricts his movement. Hydrodocone helps with these symptoms. Injections in knee helped but not much.   Also c/o pain in all toes on L foot and great toe on R foot. Toes are stinging.     MRI L shoulder 10/05/2023 Impression: 1. Evidence of prior rotator cuff repair; attenuation and degeneration of the supraspinatus tendon with small foci of insertional  interstitial tearing and shift superficial articular surface thinning and overall attenuation of the tendon without fluid filled gap; moderate amount of subacromial subdeltoidbursal fluid; very mild subscapularis tendinosis; rotator cuff muscle atrophy 2. AC joint osteoarthritis and subacromial spur as above   Past Medical History:  Diagnosis Date   Arthritis    Depression    Diabetes mellitus with complication (HCC)    Diabetic neuropathy (HCC)    Diastolic dysfunction    a. TTE 07/2017: EF 60-65%, mild concentric LVH, no RWMA, Gr1DD, trivial AI, mildly dilated LA, RVSF normal, PASP normal   Edema    feet/legs   GERD (gastroesophageal reflux disease)    Gout    History of stress test    a. MV 06/2017: small in size, mild in severity, apical anterior and apical defect that was minimally reversible and most likely represented artifact and less likely ischemia/scar, LVEF 55-65%, low risk, probably normal stress test   Hypertension    Hypothyroidism    Kidney cysts    renal failure 2013   Kidney stones    Dr. Waddell   OSA (obstructive sleep apnea)    No oxygen at all in 2-3 years.   [place] spotted fever 12/10/2017   Positve IgG in titer on 11/20/17   S/P cardiac cath 1998   a. no obstructive disease   Syncope and collapse    Temporary low platelet count (HCC)    Past Surgical History:  Procedure Laterality Date   ANAL FISSURE REPAIR  BACK SURGERY  1977   rupture disc lumbar spine   CARDIAC CATHETERIZATION  [facility] with [facility].    CATARACT EXTRACTION W/PHACO Right 07/16/2016   Procedure: CATARACT EXTRACTION PHACO AND INTRAOCULAR LENS PLACEMENT (IOC);  Surgeon: Steven Dingeldein, MD;  Location: [facility] ORS;  Service: Ophthalmology;  Laterality: Right;  US  01:11 AP% 17.6 CDE 24.83 fluid pack lot # [id number]   CATARACT EXTRACTION W/PHACO Left 08/13/2016   Procedure: CATARACT EXTRACTION PHACO AND INTRAOCULAR LENS PLACEMENT (IOC) suture  placed in left eye at end of procedure;  Surgeon: Steven Dingeldein, MD;  Location: [facility] ORS;  Service: Ophthalmology;  Laterality: Left;  US  01:55 AP% 22.7 CDE 53.56 fluid pack lot # [id number]   FINGER AMPUTATION     partial   FINGER ARTHROPLASTY     KNEE SURGERY  1993   arthroscopy   SHOULDER SURGERY Bilateral 1998   arthroscopic right, rotator cuff repair left   Social History   Socioeconomic History   Marital status: Married    Spouse name: [place]    Number of children: 3   Years of education: 13   Highest education level: Not on file  Occupational History    Comment: retired  Tobacco Use   Smoking status: Never   Smokeless tobacco: Never  Vaping Use   Vaping status: Never Used  Substance and Sexual Activity   Alcohol use: Not Currently   Drug use: No   Sexual activity: Yes  Other Topics Concern   Not on file  Social History Narrative   Lives in [place] with his wife ([place] ). No children. Three step children. No pets.      Work - Patient is retired. Maintenance supervisor.      School - One year college education.      Right handed.      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               Social Drivers of Health   Financial Resource Strain: Low Risk  (11/09/2023)   Overall Financial Resource Strain (CARDIA)    Difficulty of Paying Living Expenses: Not hard at all  Food Insecurity: No Food Insecurity (11/09/2023)   Hunger Vital Sign    Worried About Running Out of Food in the Last Year: Never true    Ran Out of Food in the Last Year: Never true  Transportation Needs: No Transportation Needs (11/09/2023)   PRAPARE - Administrator, Civil Service (Medical): No    Lack of Transportation (Non-Medical): No  Physical Activity: Inactive (11/09/2023)   Exercise Vital Sign    Days of Exercise per Week: 0 days    Minutes of Exercise per Session: 0 min  Stress: No Stress Concern Present (11/09/2023)   [employer] of  Occupational Health - Occupational Stress Questionnaire    Feeling of Stress: Not at all  Social Connections: Moderately Integrated (11/09/2023)   Social Connection and Isolation Panel    Frequency of Communication with Friends and Family: More than three times a week    Frequency of Social Gatherings with Friends and Family: More than three times a week    Attends Religious Services: More than 4 times per year    Active Member of [employer] or Organizations: No    Attends Banker Meetings: Never    Marital Status: Married   Allergies  Allergen Reactions   Bee Venom Swelling   Benadryl [Diphenhydramine Hcl (Sleep)] Other (  See Comments)    Hyperactivity    Enalapril Itching    itching   Gabapentin Palpitations and Rash    rash   Hctz [Hydrochlorothiazide] Other (See Comments)    Decreased potassium   Omeprazole Other (See Comments)    Other reaction(s): increased sx on high dose   Family History  Problem Relation Age of Onset   Breast cancer Mother    Lung cancer Mother    Bone cancer Mother    Heart Problems Mother    Cancer Mother        breast, lung and rib   AAA (abdominal aortic aneurysm) Mother    Heart disease Mother    Heart attack Father    Heart disease Father    Cancer Brother        esophageal   Heart attack Brother    Colon cancer Neg Hx    Liver disease Neg Hx     Current Outpatient Medications (Endocrine & Metabolic):    calcitRIOL  (ROCALTROL ) 0.25 MCG capsule, Take 1 capsule (0.25 mcg total) by mouth daily. (Patient not taking: Reported on 11/09/2023)   dapagliflozin  propanediol ([name] ) 10 MG TABS tablet, Take 1 tablet (10 mg total) by mouth daily.   insulin  degludec ([name]  FLEXTOUCH) 200 UNIT/ML FlexTouch Pen, Inject 40 Units into the skin daily. (Patient taking differently: Inject 44 Units into the skin daily.)   levothyroxine  (SYNTHROID ) 100 MCG tablet, TAKE 1 TABLET(100 MCG) BY MOUTH DAILY BEFORE BREAKFAST   Semaglutide , 1 MG/DOSE,  (OZEMPIC , 1 MG/DOSE,) 4 MG/3ML SOPN, Inject 1 mg into the skin once a week.   testosterone  cypionate (DEPOTESTOSTERONE CYPIONATE) 200 MG/ML injection, Inject 200 mg into the muscle every 14 (fourteen) days.  Current Outpatient Medications (Cardiovascular):    carvedilol  (COREG ) 6.25 MG tablet, TAKE 1 TABLET(6.25 MG) BY MOUTH TWICE DAILY WITH A MEAL   doxazosin  (CARDURA ) 4 MG tablet, Take 1 tablet (4 mg total) by mouth daily.   furosemide  (LASIX ) 40 MG tablet, TAKE 1 TABLET(40 MG) BY MOUTH DAILY   losartan  (COZAAR ) 25 MG tablet, TAKE 1 TABLET BY MOUTH EVERY MORNING THEN TAKE 2 TABLETS BY MOUTH EVERY EVENING   pravastatin  (PRAVACHOL ) 20 MG tablet, Take 1 tablet (20 mg total) by mouth daily.   tadalafil (CIALIS) 20 MG tablet, Take by mouth.  Current Outpatient Medications (Respiratory):    loratadine  (CLARITIN ) 10 MG tablet, Take 10 mg by mouth daily as needed for allergies.   mometasone  (NASONEX ) 50 MCG/ACT nasal spray, Place 2 sprays into the nose daily. (Patient taking differently: Place 2 sprays into the nose daily as needed.)  Current Outpatient Medications (Analgesics):    acetaminophen  (TYLENOL ) 500 MG tablet, Take 500 mg by mouth every 6 (six) hours as needed.   aspirin  81 MG tablet, Take 1 tablet (81 mg total) by mouth daily. (Patient taking differently: Take 325 mg by mouth daily.)   colchicine  0.6 MG tablet, Take 2 tablets by mouth now x 1 dose then take 1 tablet by mouth one hour later   naproxen sodium (ALEVE) 220 MG tablet, Take 220 mg by mouth 2 (two) times daily as needed.  Current Outpatient Medications (Hematological):    cyanocobalamin  (VITAMIN B12) 1000 MCG/ML injection, Inject 1,000 mcg into the muscle every 30 (thirty) days.  Current Outpatient Medications (Other):    B Complex Vitamins (BL VITAMIN B COMPLEX PO), Take by mouth daily. (Patient not taking: Reported on 11/09/2023)   Cholecalciferol (VITAMIN D3 PO), Take by mouth daily.   clove oil  liquid, Apply 1 application  topically as needed.   cyclobenzaprine  (FLEXERIL ) 10 MG tablet, Take 1 tablet (10 mg total) by mouth 3 (three) times daily as needed. (Patient not taking: Reported on 11/09/2023)   DULoxetine  (CYMBALTA ) 60 MG capsule, TAKE 1 CAPSULE(60 MG) BY MOUTH DAILY   esomeprazole  (NEXIUM ) 40 MG capsule, Take 1 capsule (40 mg total) by mouth daily at 12 noon.   finasteride  (PROSCAR ) 5 MG tablet, TAKE 1 TABLET(5 MG) BY MOUTH AT BEDTIME   gentamicin  cream (GARAMYCIN ) 0.1 %, Apply 1 application. topically 2 (two) times daily. (Patient not taking: Reported on 11/09/2023)   hydroxypropyl methylcellulose / hypromellose (ISOPTO TEARS / GONIOVISC) 2.5 % ophthalmic solution, 1 drop as needed for dry eyes.   Insulin  Pen Needle (NOVOFINE PEN NEEDLE) 32G X 6 MM MISC, 1 each by Does not apply route 2 (two) times daily.   MAGNESIUM  PO, Take 400 mg by mouth daily at 8 pm. 1 tab in the a.m. and 2 at bedtime   Multiple Vitamins-Minerals (MULTIVITAMIN MEN PO), Take 1 tablet by mouth daily. (Patient not taking: Reported on 11/09/2023)   Needle, Disp, (HYPODERMIC NEEDLE 18GX1) 18G X 1 MISC, Use every 14 days to draw up testosterone    Needle, Disp, (HYPODERMIC NEEDLE 25GX1) 25G X 1 MISC, Use every 14 days to administer testosterone    pregabalin  (LYRICA ) 225 MG capsule, Take 1 capsule (225 mg total) by mouth 2 (two) times daily.   Reviewed prior external information including notes and imaging from  primary care provider As well as notes that were available from care everywhere and other healthcare systems.  Past medical history, social, surgical and family history all reviewed in electronic medical record.  No pertanent information unless stated regarding to the chief complaint.   Review of Systems:  No headache, visual changes, nausea, vomiting, diarrhea, constipation, dizziness, abdominal pain, skin rash, fevers, chills, night sweats, weight loss, swollen lymph nodes, body aches, joint swelling, chest pain, shortness of  breath, mood changes. POSITIVE muscle aches  Objective  There were no vitals taken for this visit.   General: No apparent distress alert and oriented x3 mood and affect normal, dressed appropriately.  HEENT: Pupils equal, extraocular movements intact  Respiratory: Patient's speak in full sentences and does not appear short of breath  Cardiovascular: No lower extremity edema, non tender, no erythema  Shoulder exam shows more tenderness to palpation over the acromioclavicular joint.  Positive crossover noted.  4 out of 5 strength of the rotator cuff compared to contralateral side   Knee exam shows effusion noted bilaterally.  Tenderness to palpation bilaterally.  Patient does have some limited range of motion with some crepitus.  Procedure: Real-time Ultrasound Guided Injection of left acromioclavicular joint Device: GE Logiq Q7 Ultrasound guided injection is preferred based studies that show increased duration, increased effect, greater accuracy, decreased procedural pain, increased response rate, and decreased cost with ultrasound guided versus blind injection.  Verbal informed consent obtained.  Time-out conducted.  Noted no overlying erythema, induration, or other signs of local infection.  Skin prepped in a sterile fashion.  Local anesthesia: Topical Ethyl chloride.  With sterile technique and under real time ultrasound guidance: With a 25-gauge half inch needle injected with 0.5 cc of 0.5% Marcaine  and 0.5 cc of Kenalog 40 mg/mL Completed without difficulty  Pain immediately resolved suggesting accurate placement of the medication.  Advised to call if fevers/chills, erythema, induration, drainage, or persistent bleeding.  Impression: Technically successful ultrasound guided injection.  After informed written  and verbal consent, patient was seated on exam table. Right knee was prepped with alcohol swab and utilizing anterolateral approach, patient's right knee space was injected with  4:1  marcaine  0.5%: Kenalog 40mg /dL. Patient tolerated the procedure well without immediate complications.  After informed written and verbal consent, patient was seated on exam table. Left knee was prepped with alcohol swab and utilizing anterolateral approach, patient's left knee space was injected with 4:1  marcaine  0.5%: Kenalog 40mg /dL.  Was able to aspirate 25 cc of cloudy synovial fluid.  Patient tolerated the procedure well without immediate complications.   Impression and Recommendations:     The above documentation has been reviewed and is accurate and complete Roshanna Cimino M Dorion Petillo, DO

## 2023-12-03 ENCOUNTER — Other Ambulatory Visit: Payer: Self-pay

## 2023-12-03 ENCOUNTER — Ambulatory Visit (INDEPENDENT_AMBULATORY_CARE_PROVIDER_SITE_OTHER): Admitting: Family Medicine

## 2023-12-03 VITALS — BP 110/82 | HR 85 | Ht 71.0 in | Wt 265.0 lb

## 2023-12-03 DIAGNOSIS — G8929 Other chronic pain: Secondary | ICD-10-CM | POA: Diagnosis not present

## 2023-12-03 DIAGNOSIS — M25512 Pain in left shoulder: Secondary | ICD-10-CM | POA: Diagnosis not present

## 2023-12-03 DIAGNOSIS — M170 Bilateral primary osteoarthritis of knee: Secondary | ICD-10-CM | POA: Diagnosis not present

## 2023-12-03 NOTE — Assessment & Plan Note (Signed)
 Bilateral injections given.  He did have some mild aspiration noted on the left knee and will send for further evaluation of the fluid.  Discussed icing regimen and home exercises, discussed which activities to do and which ones to avoid.  Increase activity slowly.  Discussed icing regimen.  Follow-up with me again 10 to 12 weeks.

## 2023-12-03 NOTE — Patient Instructions (Addendum)
 Body Helix Injected both knees Injected L AC joint See me in 10-12 weeks

## 2023-12-03 NOTE — Assessment & Plan Note (Signed)
 No severe arthritic changes noted.  Responded well to the injection.  Hopeful that this will make significant improvement.  Discussed icing regimen.  Discussed which activities to do and which ones to avoid.  Follow-up again in 6 to 8 weeks otherwise.

## 2023-12-04 ENCOUNTER — Ambulatory Visit: Payer: Self-pay | Admitting: Family Medicine

## 2023-12-04 LAB — SYNOVIAL FLUID ANALYSIS, COMPLETE
Basophils, %: 0 %
Eosinophils-Synovial: 0 % (ref 0–2)
Lymphocytes-Synovial Fld: 79 % — ABNORMAL HIGH (ref 0–74)
Monocyte/Macrophage: 21 % (ref 0–69)
Neutrophil, Synovial: 0 % (ref 0–24)
Synoviocytes, %: 0 % (ref 0–15)
WBC, Synovial: 201 {cells}/uL — ABNORMAL HIGH (ref <150)

## 2023-12-04 LAB — TIQ-NTM

## 2023-12-08 ENCOUNTER — Other Ambulatory Visit: Payer: Self-pay

## 2023-12-08 DIAGNOSIS — E1142 Type 2 diabetes mellitus with diabetic polyneuropathy: Secondary | ICD-10-CM

## 2023-12-08 MED ORDER — DAPAGLIFLOZIN PROPANEDIOL 10 MG PO TABS: 10.0000 mg | ORAL_TABLET | Freq: Every day | ORAL | 3 refills | Status: AC

## 2023-12-20 ENCOUNTER — Encounter: Payer: Self-pay | Admitting: Emergency Medicine

## 2023-12-20 ENCOUNTER — Inpatient Hospital Stay
Admission: EM | Admit: 2023-12-20 | Discharge: 2023-12-24 | DRG: 853 | Disposition: A | Discharge status: Home Health | Attending: Obstetrics and Gynecology | Admitting: Obstetrics and Gynecology

## 2023-12-20 ENCOUNTER — Emergency Department

## 2023-12-20 ENCOUNTER — Other Ambulatory Visit: Payer: Self-pay

## 2023-12-20 DIAGNOSIS — Z79890 Hormone replacement therapy (postmenopausal): Secondary | ICD-10-CM | POA: Diagnosis not present

## 2023-12-20 DIAGNOSIS — N189 Chronic kidney disease, unspecified: Secondary | ICD-10-CM

## 2023-12-20 DIAGNOSIS — Z6836 Body mass index (BMI) 36.0-36.9, adult: Secondary | ICD-10-CM

## 2023-12-20 DIAGNOSIS — E785 Hyperlipidemia, unspecified: Secondary | ICD-10-CM | POA: Diagnosis present

## 2023-12-20 DIAGNOSIS — A419 Sepsis, unspecified organism: Secondary | ICD-10-CM | POA: Diagnosis not present

## 2023-12-20 DIAGNOSIS — E1140 Type 2 diabetes mellitus with diabetic neuropathy, unspecified: Secondary | ICD-10-CM | POA: Diagnosis present

## 2023-12-20 DIAGNOSIS — E871 Hypo-osmolality and hyponatremia: Secondary | ICD-10-CM | POA: Diagnosis present

## 2023-12-20 DIAGNOSIS — N179 Acute kidney failure, unspecified: Secondary | ICD-10-CM | POA: Diagnosis present

## 2023-12-20 DIAGNOSIS — E11628 Type 2 diabetes mellitus with other skin complications: Secondary | ICD-10-CM | POA: Diagnosis present

## 2023-12-20 DIAGNOSIS — Z7984 Long term (current) use of oral hypoglycemic drugs: Secondary | ICD-10-CM

## 2023-12-20 DIAGNOSIS — N1831 Chronic kidney disease, stage 3a: Secondary | ICD-10-CM | POA: Diagnosis present

## 2023-12-20 DIAGNOSIS — L02611 Cutaneous abscess of right foot: Secondary | ICD-10-CM | POA: Diagnosis present

## 2023-12-20 DIAGNOSIS — Z801 Family history of malignant neoplasm of trachea, bronchus and lung: Secondary | ICD-10-CM

## 2023-12-20 DIAGNOSIS — E039 Hypothyroidism, unspecified: Secondary | ICD-10-CM | POA: Diagnosis present

## 2023-12-20 DIAGNOSIS — Z91030 Bee allergy status: Secondary | ICD-10-CM

## 2023-12-20 DIAGNOSIS — I130 Hypertensive heart and chronic kidney disease with heart failure and stage 1 through stage 4 chronic kidney disease, or unspecified chronic kidney disease: Secondary | ICD-10-CM | POA: Diagnosis present

## 2023-12-20 DIAGNOSIS — L089 Local infection of the skin and subcutaneous tissue, unspecified: Principal | ICD-10-CM

## 2023-12-20 DIAGNOSIS — A412 Sepsis due to unspecified staphylococcus: Secondary | ICD-10-CM | POA: Diagnosis present

## 2023-12-20 DIAGNOSIS — E669 Obesity, unspecified: Secondary | ICD-10-CM | POA: Diagnosis present

## 2023-12-20 DIAGNOSIS — Z79899 Other long term (current) drug therapy: Secondary | ICD-10-CM

## 2023-12-20 DIAGNOSIS — B9561 Methicillin susceptible Staphylococcus aureus infection as the cause of diseases classified elsewhere: Secondary | ICD-10-CM | POA: Diagnosis present

## 2023-12-20 DIAGNOSIS — L84 Corns and callosities: Secondary | ICD-10-CM | POA: Diagnosis present

## 2023-12-20 DIAGNOSIS — Z803 Family history of malignant neoplasm of breast: Secondary | ICD-10-CM

## 2023-12-20 DIAGNOSIS — L02415 Cutaneous abscess of right lower limb: Secondary | ICD-10-CM | POA: Diagnosis not present

## 2023-12-20 DIAGNOSIS — M109 Gout, unspecified: Secondary | ICD-10-CM | POA: Diagnosis present

## 2023-12-20 DIAGNOSIS — L03115 Cellulitis of right lower limb: Secondary | ICD-10-CM | POA: Diagnosis present

## 2023-12-20 DIAGNOSIS — E1122 Type 2 diabetes mellitus with diabetic chronic kidney disease: Secondary | ICD-10-CM | POA: Diagnosis present

## 2023-12-20 DIAGNOSIS — Z794 Long term (current) use of insulin: Secondary | ICD-10-CM | POA: Diagnosis not present

## 2023-12-20 DIAGNOSIS — E872 Acidosis, unspecified: Secondary | ICD-10-CM | POA: Diagnosis present

## 2023-12-20 DIAGNOSIS — E1165 Type 2 diabetes mellitus with hyperglycemia: Secondary | ICD-10-CM | POA: Diagnosis present

## 2023-12-20 DIAGNOSIS — Z89029 Acquired absence of unspecified finger(s): Secondary | ICD-10-CM

## 2023-12-20 DIAGNOSIS — Z7985 Long-term (current) use of injectable non-insulin antidiabetic drugs: Secondary | ICD-10-CM

## 2023-12-20 DIAGNOSIS — Z888 Allergy status to other drugs, medicaments and biological substances status: Secondary | ICD-10-CM

## 2023-12-20 DIAGNOSIS — G9341 Metabolic encephalopathy: Secondary | ICD-10-CM | POA: Diagnosis not present

## 2023-12-20 DIAGNOSIS — Z87442 Personal history of urinary calculi: Secondary | ICD-10-CM

## 2023-12-20 DIAGNOSIS — L0390 Cellulitis, unspecified: Secondary | ICD-10-CM

## 2023-12-20 DIAGNOSIS — Z8249 Family history of ischemic heart disease and other diseases of the circulatory system: Secondary | ICD-10-CM

## 2023-12-20 DIAGNOSIS — I499 Cardiac arrhythmia, unspecified: Secondary | ICD-10-CM

## 2023-12-20 LAB — COMPREHENSIVE METABOLIC PANEL WITH GFR
ALT: 14 U/L (ref 0–44)
AST: 22 U/L (ref 15–41)
Albumin: 3.4 g/dL — ABNORMAL LOW (ref 3.5–5.0)
Alkaline Phosphatase: 74 U/L (ref 38–126)
Anion gap: 11 (ref 5–15)
BUN: 24 mg/dL — ABNORMAL HIGH (ref 8–23)
CO2: 24 mmol/L (ref 22–32)
Calcium: 8.5 mg/dL — ABNORMAL LOW (ref 8.9–10.3)
Chloride: 98 mmol/L (ref 98–111)
Creatinine, Ser: 1.84 mg/dL — ABNORMAL HIGH (ref 0.61–1.24)
GFR, Estimated: 37 mL/min — ABNORMAL LOW (ref >60)
Glucose, Bld: 228 mg/dL — ABNORMAL HIGH (ref 70–99)
Potassium: 4 mmol/L (ref 3.5–5.1)
Sodium: 133 mmol/L — ABNORMAL LOW (ref 135–145)
Total Bilirubin: 3.1 mg/dL — ABNORMAL HIGH (ref 0.0–1.2)
Total Protein: 6.8 g/dL (ref 6.5–8.1)

## 2023-12-20 LAB — CBC WITH DIFFERENTIAL/PLATELET
Abs Immature Granulocytes: 0.23 K/uL — ABNORMAL HIGH (ref 0.00–0.07)
Basophils Absolute: 0.1 K/uL (ref 0.0–0.1)
Basophils Relative: 0 %
Eosinophils Absolute: 0.1 K/uL (ref 0.0–0.5)
Eosinophils Relative: 0 %
HCT: 38.6 % — ABNORMAL LOW (ref 39.0–52.0)
Hemoglobin: 12.5 g/dL — ABNORMAL LOW (ref 13.0–17.0)
Immature Granulocytes: 1 %
Lymphocytes Relative: 6 %
Lymphs Abs: 1 K/uL (ref 0.7–4.0)
MCH: 28.7 pg (ref 26.0–34.0)
MCHC: 32.4 g/dL (ref 30.0–36.0)
MCV: 88.7 fL (ref 80.0–100.0)
Monocytes Absolute: 1.8 K/uL — ABNORMAL HIGH (ref 0.1–1.0)
Monocytes Relative: 10 %
Neutro Abs: 15.6 K/uL — ABNORMAL HIGH (ref 1.7–7.7)
Neutrophils Relative %: 83 %
Platelets: 145 K/uL — ABNORMAL LOW (ref 150–400)
RBC: 4.35 MIL/uL (ref 4.22–5.81)
RDW: 17.2 % — ABNORMAL HIGH (ref 11.5–15.5)
WBC: 18.7 K/uL — ABNORMAL HIGH (ref 4.0–10.5)
nRBC: 0 % (ref 0.0–0.2)

## 2023-12-20 LAB — LACTIC ACID, PLASMA
Lactic Acid, Venous: 2.6 mmol/L (ref 0.5–1.9)
Lactic Acid, Venous: 1.5 mmol/L (ref 0.5–1.9)

## 2023-12-20 MED ORDER — MAGNESIUM HYDROXIDE 400 MG/5ML PO SUSP: 30.0000 mL | Freq: Every day | ORAL | Status: DC | PRN

## 2023-12-20 MED ORDER — LOSARTAN POTASSIUM 50 MG PO TABS: 25.0000 mg | ORAL_TABLET | Freq: Every day | ORAL | Status: DC

## 2023-12-20 MED ORDER — VANCOMYCIN HCL IN DEXTROSE 1-5 GM/200ML-% IV SOLN: 1000.0000 mg | Freq: Once | INTRAVENOUS | Status: DC

## 2023-12-20 MED ORDER — ENOXAPARIN SODIUM 60 MG/0.6ML IJ SOSY
60.0000 mg | PREFILLED_SYRINGE | INTRAMUSCULAR | Status: DC
  Administered 2023-12-21 – 2023-12-24 (×4): 60 mg via SUBCUTANEOUS
  Filled 2023-12-20 (×4): qty 0.6

## 2023-12-20 MED ORDER — DOXAZOSIN MESYLATE 4 MG PO TABS
4.0000 mg | ORAL_TABLET | Freq: Every day | ORAL | Status: DC
  Administered 2023-12-21 – 2023-12-24 (×4): 4 mg via ORAL
  Filled 2023-12-20 (×4): qty 1

## 2023-12-20 MED ORDER — ONDANSETRON HCL 4 MG PO TABS
4.0000 mg | ORAL_TABLET | Freq: Four times a day (QID) | ORAL | Status: DC | PRN
Start: 2023-12-20 — End: 2023-12-24

## 2023-12-20 MED ORDER — LORATADINE 10 MG PO TABS: 10.0000 mg | ORAL_TABLET | Freq: Every day | ORAL | Status: DC | PRN

## 2023-12-20 MED ORDER — ONDANSETRON HCL 4 MG/2ML IJ SOLN: 4.0000 mg | Freq: Four times a day (QID) | INTRAMUSCULAR | Status: DC | PRN

## 2023-12-20 MED ORDER — VITAMIN D 25 MCG (1000 UNIT) PO TABS
1000.0000 [IU] | ORAL_TABLET | Freq: Every day | ORAL | Status: DC
  Administered 2023-12-21 – 2023-12-24 (×4): 1000 [IU] via ORAL
  Filled 2023-12-20 (×4): qty 1

## 2023-12-20 MED ORDER — LACTATED RINGERS IV SOLN
150.0000 mL/h | INTRAVENOUS | Status: DC
  Administered 2023-12-21 (×2): 150 mL/h via INTRAVENOUS

## 2023-12-20 MED ORDER — FINASTERIDE 5 MG PO TABS
5.0000 mg | ORAL_TABLET | Freq: Every day | ORAL | Status: DC
  Administered 2023-12-21 – 2023-12-23 (×3): 5 mg via ORAL
  Filled 2023-12-20 (×3): qty 1

## 2023-12-20 MED ORDER — PRAVASTATIN SODIUM 20 MG PO TABS
20.0000 mg | ORAL_TABLET | Freq: Every day | ORAL | Status: DC
  Administered 2023-12-21 – 2023-12-23 (×3): 20 mg via ORAL
  Filled 2023-12-20 (×3): qty 1

## 2023-12-20 MED ORDER — TRAZODONE HCL 50 MG PO TABS
25.0000 mg | ORAL_TABLET | Freq: Every evening | ORAL | Status: DC | PRN
  Administered 2023-12-23 (×2): 25 mg via ORAL
  Filled 2023-12-20 (×2): qty 1

## 2023-12-20 MED ORDER — FUROSEMIDE 40 MG PO TABS
40.0000 mg | ORAL_TABLET | Freq: Every day | ORAL | Status: DC
  Administered 2023-12-21: 40 mg via ORAL
  Filled 2023-12-20: qty 1

## 2023-12-20 MED ORDER — POLYVINYL ALCOHOL 1.4 % OP SOLN: 1.0000 [drp] | OPHTHALMIC | Status: DC | PRN

## 2023-12-20 MED ORDER — SODIUM CHLORIDE 0.9 % IV SOLN
2.0000 g | Freq: Two times a day (BID) | INTRAVENOUS | Status: DC
  Administered 2023-12-21 – 2023-12-23 (×5): 2 g via INTRAVENOUS
  Filled 2023-12-20 (×5): qty 12.5

## 2023-12-20 MED ORDER — CYCLOBENZAPRINE HCL 10 MG PO TABS: 10.0000 mg | ORAL_TABLET | Freq: Three times a day (TID) | ORAL | Status: DC | PRN

## 2023-12-20 MED ORDER — ADULT MULTIVITAMIN W/MINERALS CH
ORAL_TABLET | Freq: Every day | ORAL | Status: DC
  Administered 2023-12-21 – 2023-12-24 (×4): 1 via ORAL
  Filled 2023-12-20 (×4): qty 1

## 2023-12-20 MED ORDER — VANCOMYCIN HCL 1500 MG/300ML IV SOLN
1500.0000 mg | Freq: Once | INTRAVENOUS | Status: AC
  Administered 2023-12-20: 1500 mg via INTRAVENOUS
  Filled 2023-12-20: qty 300

## 2023-12-20 MED ORDER — COLCHICINE 0.6 MG PO TABS: 0.6000 mg | ORAL_TABLET | ORAL | Status: DC | PRN

## 2023-12-20 MED ORDER — ACETAMINOPHEN 325 MG PO TABS
650.0000 mg | ORAL_TABLET | Freq: Four times a day (QID) | ORAL | Status: DC | PRN
Start: 2023-12-20 — End: 2023-12-24
  Administered 2023-12-20 – 2023-12-24 (×4): 650 mg via ORAL
  Filled 2023-12-20 (×4): qty 2

## 2023-12-20 MED ORDER — LEVOTHYROXINE SODIUM 100 MCG PO TABS
100.0000 ug | ORAL_TABLET | Freq: Every day | ORAL | Status: DC
  Administered 2023-12-21 – 2023-12-24 (×4): 100 ug via ORAL
  Filled 2023-12-20: qty 2
  Filled 2023-12-20 (×3): qty 1

## 2023-12-20 MED ORDER — SODIUM CHLORIDE 0.9 % IV SOLN
2.0000 g | Freq: Once | INTRAVENOUS | Status: AC
  Administered 2023-12-20: 2 g via INTRAVENOUS
  Filled 2023-12-20: qty 20

## 2023-12-20 MED ORDER — PREGABALIN 75 MG PO CAPS
225.0000 mg | ORAL_CAPSULE | Freq: Two times a day (BID) | ORAL | Status: DC
  Administered 2023-12-21 – 2023-12-23 (×5): 225 mg via ORAL
  Filled 2023-12-20 (×5): qty 3

## 2023-12-20 MED ORDER — DAPAGLIFLOZIN PROPANEDIOL 10 MG PO TABS
10.0000 mg | ORAL_TABLET | Freq: Every day | ORAL | Status: DC
  Administered 2023-12-21 – 2023-12-23 (×3): 10 mg via ORAL
  Filled 2023-12-20 (×3): qty 1

## 2023-12-20 MED ORDER — SODIUM CHLORIDE 0.9 % IV SOLN: 2.0000 g | Freq: Once | INTRAVENOUS | Status: DC

## 2023-12-20 MED ORDER — DULOXETINE HCL 60 MG PO CPEP
60.0000 mg | ORAL_CAPSULE | Freq: Every day | ORAL | Status: DC
Start: 2023-12-21 — End: 2023-12-24
  Administered 2023-12-21 – 2023-12-24 (×4): 60 mg via ORAL
  Filled 2023-12-20: qty 1
  Filled 2023-12-20 (×3): qty 2

## 2023-12-20 MED ORDER — ACETAMINOPHEN 650 MG RE SUPP: 650.0000 mg | Freq: Four times a day (QID) | RECTAL | Status: DC | PRN

## 2023-12-20 MED ORDER — PANTOPRAZOLE SODIUM 40 MG PO TBEC
40.0000 mg | DELAYED_RELEASE_TABLET | Freq: Every day | ORAL | Status: DC
  Administered 2023-12-21 – 2023-12-24 (×4): 40 mg via ORAL
  Filled 2023-12-20 (×4): qty 1

## 2023-12-20 MED ORDER — INSULIN GLARGINE 100 UNIT/ML ~~LOC~~ SOLN
44.0000 [IU] | Freq: Every day | SUBCUTANEOUS | Status: DC
  Administered 2023-12-21 – 2023-12-24 (×4): 44 [IU] via SUBCUTANEOUS
  Filled 2023-12-20 (×4): qty 0.44

## 2023-12-20 MED ORDER — CALCITRIOL 0.25 MCG PO CAPS
0.2500 ug | ORAL_CAPSULE | Freq: Every day | ORAL | Status: DC
  Administered 2023-12-21 – 2023-12-24 (×4): 0.25 ug via ORAL
  Filled 2023-12-20 (×4): qty 1

## 2023-12-20 MED ORDER — CARVEDILOL 6.25 MG PO TABS
6.2500 mg | ORAL_TABLET | Freq: Two times a day (BID) | ORAL | Status: DC
  Administered 2023-12-21 – 2023-12-24 (×7): 6.25 mg via ORAL
  Filled 2023-12-20 (×7): qty 1

## 2023-12-20 MED ORDER — VANCOMYCIN HCL IN DEXTROSE 1-5 GM/200ML-% IV SOLN
1000.0000 mg | Freq: Once | INTRAVENOUS | Status: AC
  Administered 2023-12-20: 1000 mg via INTRAVENOUS
  Filled 2023-12-20: qty 200

## 2023-12-20 MED ORDER — SODIUM CHLORIDE 0.9 % IV BOLUS (SEPSIS)
1000.0000 mL | Freq: Once | INTRAVENOUS | Status: AC
  Administered 2023-12-20: 1000 mL via INTRAVENOUS

## 2023-12-20 MED ORDER — VANCOMYCIN HCL IN DEXTROSE 1-5 GM/200ML-% IV SOLN: 1000.0000 mg | INTRAVENOUS | Status: DC

## 2023-12-20 NOTE — ED Triage Notes (Signed)
 Patient reports painful wound on right foot first noticed today. Presents with redness, warmth, and bruising to right foot. States he thinks he may have hit his foot on their bed this morning. Hx of diabetes.

## 2023-12-20 NOTE — ED Provider Notes (Signed)
 [facility] Provider Note    None    (approximate)   History   Chief Complaint: Wound Infection   HPI  Douglas [name] is a 78 y.o. male with history of diabetes, GERD, obesity who comes ED complaining of pain and swelling in the right foot that is been worsening for the past week or 2.  Denies chest pain or shortness of breath.  Denies fever.  Denies known penetrating trauma.        Past Medical History:  Diagnosis Date   Arthritis    Depression    Diabetes mellitus with complication (HCC)    Diabetic neuropathy (HCC)    Diastolic dysfunction    a. TTE 07/2017: EF 60-65%, mild concentric LVH, no RWMA, Gr1DD, trivial AI, mildly dilated LA, RVSF normal, PASP normal   Edema    feet/legs   GERD (gastroesophageal reflux disease)    Gout    History of stress test    a. MV 06/2017: small in size, mild in severity, apical anterior and apical defect that was minimally reversible and most likely represented artifact and less likely ischemia/scar, LVEF 55-65%, low risk, probably normal stress test   Hypertension    Hypothyroidism    Kidney cysts    renal failure 2013   Kidney stones    Dr. Waddell   OSA (obstructive sleep apnea)    No oxygen at all in 2-3 years.   [place] spotted fever 12/10/2017   Positve IgG in titer on 11/20/17   S/P cardiac cath 1998   a. no obstructive disease   Syncope and collapse    Temporary low platelet count [facility])     Current Outpatient Rx   Order #: [id number] Class: Historical Med   Order #: [id number] Class: No Print   Order #: [id number] Class: Historical Med   Order #: [id number] Class: Normal   Order #: [id number] Class: Normal   Order #: [id number] Class: Historical Med   Order #: [id number] Class: Historical Med   Order #: [id number] Class: Normal   Order #: [id number] Class: Historical Med   Order #: [id number] Class: Normal   Order #: [id number] Class: Normal   Order #: [id number] Class: Normal   Order #:  [id number] Class: Normal   Order #: [id number] Class: Normal   Order #: [id number] Class: Normal   Order #: [id number] Class: Normal   Order #: [id number] Class: Normal   Order #: [id number] Class: Historical Med   Order #: [id number] Class: No Print   Order #: [id number] Class: No Print   Order #: [id number] Class: Normal   Order #: [id number] Class: Historical Med   Order #: [id number] Class: Normal   Order #: [id number] Class: Historical Med   Order #: [id number] Class: Normal   Order #: [id number] Class: Historical Med   Order #: [id number] Class: Historical Med   Order #: [id number] Class: Historical Med   Order #: [id number] Class: Historical Med   Order #: [id number] Class: Normal   Order #: [id number] Class: Normal   Order #: [id number] Class: No Print   Order #: [id number] Class: Historical Med   Order #: [id number] Class: Historical Med    Past Surgical History:  Procedure Laterality Date   ANAL FISSURE REPAIR     BACK SURGERY  1977   rupture disc lumbar spine   CARDIAC CATHETERIZATION  1998   [facility] with [facility].    CATARACT EXTRACTION W/PHACO Right 07/16/2016   Procedure: CATARACT EXTRACTION PHACO AND INTRAOCULAR LENS PLACEMENT (IOC);  Surgeon: Steven Dingeldein, MD;  Location: [facility] ORS;  Service: Ophthalmology;  Laterality: Right;  US  01:11 AP% 17.6  CDE 24.83 fluid pack lot # [id number]   CATARACT EXTRACTION W/PHACO Left 08/13/2016   Procedure: CATARACT EXTRACTION PHACO AND INTRAOCULAR LENS PLACEMENT (IOC) suture placed in left eye at end of procedure;  Surgeon: Steven Dingeldein, MD;  Location: [facility] ORS;  Service: Ophthalmology;  Laterality: Left;  US  01:55 AP% 22.7 CDE 53.56 fluid pack lot # [id number]   FINGER AMPUTATION     partial   FINGER ARTHROPLASTY     KNEE SURGERY  1993   arthroscopy   SHOULDER SURGERY Bilateral 1998   arthroscopic right, rotator cuff repair left    Physical Exam   Triage Vital Signs: ED Triage Vitals  Encounter Vitals Group     BP  12/20/23 2041 (!) 152/76     Girls Systolic BP Percentile --      Girls Diastolic BP Percentile --      Boys Systolic BP Percentile --      Boys Diastolic BP Percentile --      Pulse Rate 12/20/23 2041 (!) 115     Resp 12/20/23 2041 20     Temp 12/20/23 2041 100.2 F (37.9 C)     Temp Source 12/20/23 2041 Oral     SpO2 12/20/23 2041 98 %     Weight 12/20/23 2039 265 lb (120.2 kg)     Height 12/20/23 2039 5' 11 (1.803 m)     Head Circumference --      Peak Flow --      Pain Score 12/20/23 2039 10     Pain Loc --      Pain Education --      Exclude from Growth Chart --     Most recent vital signs: Vitals:   12/20/23 2041  BP: (!) 152/76  Pulse: (!) 115  Resp: 20  Temp: 100.2 F (37.9 C)  SpO2: 98%    General: Awake, no distress.  CV:  Good peripheral perfusion.  Tachycardia heart rate 110 Resp:  Normal effort.  Clear lungs Abd:  No distention.  Soft nontender Other:  There is erythema from the right foot up to the right mid calf with warmth and tenderness.  No crepitus.  Around the area of the right first MTP joint, there is ecchymosis, more pronounced swelling with tissue maceration.  No purulent drainage   ED Results / Procedures / Treatments   Labs (all labs ordered are listed, but only abnormal results are displayed) Labs Reviewed  LACTIC ACID, PLASMA - Abnormal; Notable for the following components:      Result Value   Lactic Acid, Venous 2.6 (*)    All other components within normal limits  COMPREHENSIVE METABOLIC PANEL WITH GFR - Abnormal; Notable for the following components:   Sodium 133 (*)    Glucose, Bld 228 (*)    BUN 24 (*)    Creatinine, Ser 1.84 (*)    Calcium 8.5 (*)    Albumin 3.4 (*)    Total Bilirubin 3.1 (*)    GFR, Estimated 37 (*)    All other components within normal limits  CBC WITH DIFFERENTIAL/PLATELET - Abnormal; Notable for the following components:   WBC 18.7 (*)    Hemoglobin 12.5 (*)    HCT 38.6 (*)    RDW 17.2 (*)     Platelets 145 (*)    Neutro Abs 15.6 (*)    Monocytes Absolute 1.8 (*)    Abs Immature Granulocytes 0.23 (*)    All other components within normal limits  CULTURE,  BLOOD (ROUTINE X 2)  CULTURE, BLOOD (ROUTINE X 2)  LACTIC ACID, PLASMA  URINALYSIS, W/ REFLEX TO CULTURE (INFECTION SUSPECTED)     EKG    RADIOLOGY X-ray right foot interpreted by me, negative for signs of osteomyelitis.  Radiology report reviewed   PROCEDURES:  .Critical Care  Performed by: Viviann Pastor, MD Authorized by: Viviann Pastor, MD   Critical care provider statement:    Critical care time (minutes):  35   Critical care time was exclusive of:  Separately billable procedures and treating other patients   Critical care was necessary to treat or prevent imminent or life-threatening deterioration of the following conditions:  Sepsis   Critical care was time spent personally by me on the following activities:  Development of treatment plan with patient or surrogate, discussions with consultants, evaluation of patient's response to treatment, examination of patient, obtaining history from patient or surrogate, ordering and performing treatments and interventions, ordering and review of laboratory studies, ordering and review of radiographic studies, pulse oximetry, re-evaluation of patient's condition and review of old charts   Care discussed with: admitting provider      MEDICATIONS ORDERED IN ED: Medications  sodium chloride  0.9 % bolus 1,000 mL (1,000 mLs Intravenous New Bag/Given 12/20/23 2124)  vancomycin  (VANCOCIN ) IVPB 1000 mg/200 mL premix (1,000 mg Intravenous New Bag/Given 12/20/23 2153)  colchicine  tablet 0.6 mg (has no administration in time range)  carvedilol  (COREG ) tablet 6.25 mg (has no administration in time range)  doxazosin  (CARDURA ) tablet 4 mg (has no administration in time range)  furosemide  (LASIX ) tablet 40 mg (has no administration in time range)  losartan  (COZAAR ) tablet 25 mg  (has no administration in time range)  pravastatin  (PRAVACHOL ) tablet 20 mg (has no administration in time range)  DULoxetine  (CYMBALTA ) DR capsule 60 mg (has no administration in time range)  calcitRIOL  (ROCALTROL ) capsule 0.25 mcg (has no administration in time range)  dapagliflozin  propanediol ([name] ) tablet 10 mg (has no administration in time range)  insulin  degludec ([name] ) 200 UNIT/ML FlexTouch Pen SOPN 44 Units (has no administration in time range)  levothyroxine  (SYNTHROID ) tablet 100 mcg (has no administration in time range)  finasteride  (PROSCAR ) tablet 5 mg (has no administration in time range)  pantoprazole  (PROTONIX ) EC tablet 40 mg (has no administration in time range)  cyclobenzaprine  (FLEXERIL ) tablet 10 mg (has no administration in time range)  pregabalin  (LYRICA ) capsule 225 mg (has no administration in time range)  cholecalciferol  (VITAMIN D3) 25 MCG (1000 UNIT) tablet 1,000 Units (has no administration in time range)  Multivitamin Men TABS (has no administration in time range)  loratadine  (CLARITIN ) tablet 10 mg (has no administration in time range)  hydroxypropyl methylcellulose / hypromellose (ISOPTO TEARS / GONIOVISC) 2.5 % ophthalmic solution 1 drop (has no administration in time range)  enoxaparin  (LOVENOX ) injection 40 mg (has no administration in time range)  lactated ringers  infusion (has no administration in time range)  vancomycin  (VANCOCIN ) IVPB 1000 mg/200 mL premix (has no administration in time range)  ceFEPIme  (MAXIPIME ) 2 g in sodium chloride  0.9 % 100 mL IVPB (has no administration in time range)  acetaminophen  (TYLENOL ) tablet 650 mg (has no administration in time range)    Or  acetaminophen  (TYLENOL ) suppository 650 mg (has no administration in time range)  traZODone  (DESYREL ) tablet 25 mg (has no administration in time range)  magnesium  hydroxide (MILK OF MAGNESIA) suspension 30 mL (has no administration in time range)  ondansetron  (ZOFRAN ) tablet  4 mg (has no administration in  time range)    Or  ondansetron  (ZOFRAN ) injection 4 mg (has no administration in time range)  cefTRIAXone  (ROCEPHIN ) 2 g in sodium chloride  0.9 % 100 mL IVPB (0 g Intravenous Stopped 12/20/23 2153)     IMPRESSION / MDM / ASSESSMENT AND PLAN / ED COURSE  I reviewed the triage vital signs and the nursing notes.  DDx: Cellulitis, foot abscess, osteomyelitis, AKI, electrolyte derangement, sepsis  Patient's presentation is most consistent with acute presentation with potential threat to life or bodily function.  Patient presents with pain redness swelling of the right foot.  Clinically apparent infection, suspicious for an abscess.  He is diabetic with fever, tachycardia, leukocytosis concerning for sepsis.  Will give IV fluids, started on ceftriaxone  and vancomycin .  Case discussed with hospitalist for further management.       FINAL CLINICAL IMPRESSION(S) / ED DIAGNOSES   Final diagnoses:  Type 2 diabetes mellitus with right diabetic foot infection (HCC)  Sepsis without acute organ dysfunction, due to unspecified organism [facility])     Rx / DC Orders   ED Discharge Orders     None        Note:  This document was prepared using Dragon voice recognition software and may include unintentional dictation errors.   Viviann Pastor, MD 12/20/23 2218

## 2023-12-20 NOTE — ED Notes (Signed)
Ice chips given per pt request

## 2023-12-20 NOTE — H&P (Addendum)
 [place]   PATIENT NAME: Douglas [name]    MR#:  [id number]  DATE OF BIRTH:  Nov 14, 1945  DATE OF ADMISSION:  12/20/2023  PRIMARY CARE PHYSICIAN: Gretel App, NP   Patient is coming from: Home  REQUESTING/REFERRING PHYSICIAN: Viviann Mungo, MD  CHIEF COMPLAINT:   Chief Complaint  Patient presents with   Wound Infection    HISTORY OF PRESENT ILLNESS:  Douglas [name] is a 78 y.o. Caucasian male with medical history significant for type 2 diabetes mellitus, diastolic dysfunction, depression, GERD, gout, essential hypertension and hypothyroidism, who presented to the emergency room with acute onset of right foot pain and swelling which has been worsening over the last couple weeks and developed redness since last night with tenderness and pain with mild oozing.  He had a temperature of 100.6 at home with chills.  No recent falls or trauma.  No nausea or vomiting or abdominal pain.  No chest pain or palpitations.  No cough or wheezing or dyspnea.  No dysuria, oliguria or hematuria or flank pain.  ED Course: When the patient came to the ER, BP was 152/76 with temperature of 100.2/37.9, and later 100.6/38.1, heart rate of 115 and otherwise normal vital signs.  Later on respiratory rate was 22.  Labs revealed mild hyponatremia 133 and blood glucose 228, BUN of 24 and creatinine 1.84 above previous levels and albumin 3.4 with total bili of 3.1.  Lactic acid was 2.6 and later 1.5.  CBC showed leukocytosis of 18.7 with neutrophilia and mild anemia, as well as mild thrombocytopenia slightly worse than previous levels in June of this year. EKG as reviewed by me : None. Imaging: Right foot x-ray showed marked dorsal soft tissue swelling and midfoot arthritis.  The patient was given IV Rocephin  and vancomycin  as well as 1 L bolus of IV normal saline.  He will be admitted to a medical telemetry bed for further evaluation and management. PAST MEDICAL HISTORY:   Past Medical  History:  Diagnosis Date   Arthritis    Depression    Diabetes mellitus with complication (HCC)    Diabetic neuropathy (HCC)    Diastolic dysfunction    a. TTE 07/2017: EF 60-65%, mild concentric LVH, no RWMA, Gr1DD, trivial AI, mildly dilated LA, RVSF normal, PASP normal   Edema    feet/legs   GERD (gastroesophageal reflux disease)    Gout    History of stress test    a. MV 06/2017: small in size, mild in severity, apical anterior and apical defect that was minimally reversible and most likely represented artifact and less likely ischemia/scar, LVEF 55-65%, low risk, probably normal stress test   Hypertension    Hypothyroidism    Kidney cysts    renal failure 2013   Kidney stones    Dr. Waddell   OSA (obstructive sleep apnea)    No oxygen at all in 2-3 years.   [place] spotted fever 12/10/2017   Positve IgG in titer on 11/20/17   S/P cardiac cath 1998   a. no obstructive disease   Syncope and collapse    Temporary low platelet count (HCC)     PAST SURGICAL HISTORY:   Past Surgical History:  Procedure Laterality Date   ANAL FISSURE REPAIR     BACK SURGERY  1977   rupture disc lumbar spine   CARDIAC CATHETERIZATION  1998   [facility] with [facility].    CATARACT EXTRACTION W/PHACO Right 07/16/2016  Procedure: CATARACT EXTRACTION PHACO AND INTRAOCULAR LENS PLACEMENT (IOC);  Surgeon: Steven Dingeldein, MD;  Location: [facility] ORS;  Service: Ophthalmology;  Laterality: Right;  US  01:11 AP% 17.6 CDE 24.83 fluid pack lot # [id number]   CATARACT EXTRACTION W/PHACO Left 08/13/2016   Procedure: CATARACT EXTRACTION PHACO AND INTRAOCULAR LENS PLACEMENT (IOC) suture placed in left eye at end of procedure;  Surgeon: Steven Dingeldein, MD;  Location: [facility] ORS;  Service: Ophthalmology;  Laterality: Left;  US  01:55 AP% 22.7 CDE 53.56 fluid pack lot # [id number]   FINGER AMPUTATION     partial   FINGER ARTHROPLASTY     KNEE SURGERY  1993   arthroscopy   SHOULDER  SURGERY Bilateral 1998   arthroscopic right, rotator cuff repair left    SOCIAL HISTORY:   Social History   Tobacco Use   Smoking status: Never   Smokeless tobacco: Never  Substance Use Topics   Alcohol  use: Not Currently    FAMILY HISTORY:   Family History  Problem Relation Age of Onset   Breast cancer Mother    Lung cancer Mother    Bone cancer Mother    Heart Problems Mother    Cancer Mother        breast, lung and rib   AAA (abdominal aortic aneurysm) Mother    Heart disease Mother    Heart attack Father    Heart disease Father    Cancer Brother        esophageal   Heart attack Brother    Colon cancer Neg Hx    Liver disease Neg Hx     DRUG ALLERGIES:   Allergies  Allergen Reactions   Bee Venom Swelling   Benadryl [Diphenhydramine Hcl (Sleep)] Other (See Comments)    Hyperactivity    Enalapril Itching    itching   Gabapentin Palpitations and Rash    rash   Hctz [Hydrochlorothiazide] Other (See Comments)    Decreased potassium   Omeprazole Other (See Comments)    Other reaction(s): increased sx on high dose    REVIEW OF SYSTEMS:   ROS As per history of present illness. All pertinent systems were reviewed above. Constitutional, HEENT, cardiovascular, respiratory, GI, GU, musculoskeletal, neuro, psychiatric, endocrine, integumentary and hematologic systems were reviewed and are otherwise negative/unremarkable except for positive findings mentioned above in the HPI.   MEDICATIONS AT HOME:   Prior to Admission medications   Medication Sig Start Date End Date Taking? Authorizing Provider  acetaminophen  (TYLENOL ) 500 MG tablet Take 500 mg by mouth every 6 (six) hours as needed.   Yes [provider]  B Complex Vitamins (BL VITAMIN B COMPLEX PO) Take by mouth daily.   Yes [provider]  calcitRIOL  (ROCALTROL ) 0.25 MCG capsule Take 1 capsule (0.25 mcg total) by mouth daily. 07/20/23  Yes Hope Merle, MD  carvedilol  (COREG ) 6.25 MG  tablet TAKE 1 TABLET(6.25 MG) BY MOUTH TWICE DAILY WITH A MEAL 09/18/23  Yes Gretel App, NP  Cholecalciferol  (VITAMIN D3) 50 MCG (2000 UT) TABS Take by mouth daily.   Yes [provider]  clove oil liquid Apply 1 application topically as needed.   Yes [provider]  colchicine  0.6 MG tablet Take 2 tablets by mouth now x 1 dose then take 1 tablet by mouth one hour later 10/01/23  Yes Lester, Kacy, NP  cyanocobalamin  (VITAMIN B12) 1000 MCG/ML injection Inject 1,000 mcg into the muscle every 30 (thirty) days.   Yes [provider]  cyclobenzaprine  (FLEXERIL ) 10  MG tablet Take 1 tablet (10 mg total) by mouth 3 (three) times daily as needed. 10/01/23  Yes Gretel App, NP  dapagliflozin  propanediol ([name] ) 10 MG TABS tablet Take 1 tablet (10 mg total) by mouth daily. 12/08/23  Yes Gretel App, NP  doxazosin  (CARDURA ) 4 MG tablet Take 1 tablet (4 mg total) by mouth daily. 10/22/23  Yes Gretel App, NP  DULoxetine  (CYMBALTA ) 60 MG capsule TAKE 1 CAPSULE(60 MG) BY MOUTH DAILY 04/02/23  Yes Maribeth Camellia MATSU, MD  esomeprazole  (NEXIUM ) 40 MG capsule Take 1 capsule (40 mg total) by mouth daily at 12 noon. 08/04/13  Yes Vannie Delon LABOR, MD  finasteride  (PROSCAR ) 5 MG tablet TAKE 1 TABLET(5 MG) BY MOUTH AT BEDTIME 05/15/21  Yes Stoioff, Glendia BROCKS, MD  furosemide  (LASIX ) 40 MG tablet TAKE 1 TABLET(40 MG) BY MOUTH DAILY 09/18/23  Yes Gretel App, NP  hydroxypropyl methylcellulose / hypromellose (ISOPTO TEARS / GONIOVISC) 2.5 % ophthalmic solution 1 drop as needed for dry eyes.   Yes [provider]  insulin  degludec ([name]  FLEXTOUCH) 200 UNIT/ML FlexTouch Pen Inject 40 Units into the skin daily. Patient taking differently: Inject 44 Units into the skin daily. 02/19/22  Yes Maribeth Camellia MATSU, MD  levothyroxine  (SYNTHROID ) 100 MCG tablet TAKE 1 TABLET(100 MCG) BY MOUTH DAILY BEFORE BREAKFAST 08/21/23  Yes Gretel App, NP  loratadine  (CLARITIN ) 10 MG tablet Take 10 mg by mouth  daily as needed for allergies.   Yes [provider]  losartan  (COZAAR ) 25 MG tablet TAKE 1 TABLET BY MOUTH EVERY MORNING THEN TAKE 2 TABLETS BY MOUTH EVERY EVENING 11/05/23  Yes Gretel App, NP  MAGNESIUM  PO Take 250 mg by mouth 2 (two) times daily.   Yes [provider]  mometasone  (NASONEX ) 50 MCG/ACT nasal spray Place 2 sprays into the nose daily. Patient taking differently: Place 2 sprays into the nose daily as needed. 03/13/22  Yes Maribeth Camellia MATSU, MD  Multiple Vitamins-Minerals (MULTIVITAMIN MEN PO) Take 1 tablet by mouth daily.   Yes [provider]  naproxen sodium (ALEVE) 220 MG tablet Take 220 mg by mouth 2 (two) times daily as needed.   Yes [provider]  pregabalin  (LYRICA ) 225 MG capsule Take 1 capsule (225 mg total) by mouth 2 (two) times daily. 10/01/23  Yes Gretel App, NP  Semaglutide , 1 MG/DOSE, (OZEMPIC , 1 MG/DOSE,) 4 MG/3ML SOPN Inject 1 mg into the skin once a week. Patient taking differently: Inject 2 mg into the skin once a week. 08/01/20  Yes Maribeth Camellia MATSU, MD  tadalafil (CIALIS) 20 MG tablet Take 20 mg by mouth daily as needed. 01/19/20  Yes [provider]  testosterone  cypionate (DEPOTESTOSTERONE CYPIONATE) 200 MG/ML injection Inject 200 mg into the muscle every 14 (fourteen) days. 01/24/20  Yes [provider]  gentamicin  cream (GARAMYCIN ) 0.1 % Apply 1 application. topically 2 (two) times daily. Patient not taking: Reported on 12/20/2023 09/06/21   Janit Thresa HERO, DPM  Insulin  Pen Needle (NOVOFINE PEN NEEDLE) 32G X 6 MM MISC 1 each by Does not apply route 2 (two) times daily. 02/19/22   Maribeth Camellia MATSU, MD  Needle, Disp, (HYPODERMIC NEEDLE 18GX1) 18G X 1 MISC Use every 14 days to draw up testosterone  01/29/22   [provider]  Needle, Disp, (HYPODERMIC NEEDLE 25GX1) 25G X 1 MISC Use every 14 days to administer testosterone  01/29/22   [provider]  pravastatin  (PRAVACHOL ) 20 MG tablet  Take 1 tablet (20 mg total) by mouth daily.  Patient not taking: Reported on 12/20/2023 07/01/23   Gretel App, NP      VITAL SIGNS:  Blood pressure (!) 152/76, pulse 98, temperature 99.1 F (37.3 C), temperature source Oral, resp. rate 20, height 5' 11 (1.803 m), weight 120.2 kg, SpO2 98%.  PHYSICAL EXAMINATION:  Physical Exam  GENERAL:  78 y.o.-year-old, Caucasian male patient lying in the bed with no acute distress.  EYES: Pupils equal, round, reactive to light and accommodation. No scleral icterus. Extraocular muscles intact.  HEENT: Head atraumatic, normocephalic. Oropharynx and nasopharynx clear.  NECK:  Supple, no jugular venous distention. No thyroid  enlargement, no tenderness.  LUNGS: Normal breath sounds bilaterally, no wheezing, rales,rhonchi or crepitation. No use of accessory muscles of respiration.  CARDIOVASCULAR: Regular rate and rhythm, S1, S2 normal. No murmurs, rubs, or gallops.  ABDOMEN: Soft, nondistended, nontender. Bowel sounds present. No organomegaly or mass.  EXTREMITIES: No leg edema, and no cyanosis, or clubbing.  NEUROLOGIC: Cranial nerves II through XII are intact. Muscle strength 5/5 in all extremities. Sensation intact. Gait not checked.  PSYCHIATRIC: The patient is alert and oriented x 3.  Normal affect and good eye contact. SKIN: Right dorsal foot erythema mainly on the medial side mostly on the big toe with diffuse swelling and plantar first MTP swelling with mild fluctuation and whitish discoloration suspicious of abscess.     LABORATORY PANEL:   CBC Recent Labs  Lab 12/20/23 2048  WBC 18.7*  HGB 12.5*  HCT 38.6*  PLT 145*   ------------------------------------------------------------------------------------------------------------------  Chemistries  Recent Labs  Lab 12/20/23 2048  NA 133*  K 4.0  CL 98  CO2 24  GLUCOSE 228*  BUN 24*  CREATININE 1.84*  CALCIUM 8.5*  AST 22  ALT 14  ALKPHOS 74  BILITOT 3.1*    ------------------------------------------------------------------------------------------------------------------  Cardiac Enzymes No results for input(s): TROPONINI in the last 168 hours. ------------------------------------------------------------------------------------------------------------------  RADIOLOGY:  DG Foot Complete Right Result Date: 12/20/2023 EXAM: 3 or more VIEW(S) XRAY OF THE RIGHT FOOT 12/20/2023 08:57:41 PM COMPARISON: 07/21/23. CLINICAL HISTORY: Right foot pain, patient states he has neuropathy and had two toenails pulled. FINDINGS: BONES AND JOINTS: No acute fracture. No focal osseous lesion. No joint dislocation. Plantar and posterior calcaneal spurs. Midfoot osteoarthritis. Hallux valgus. First toe MTP joint osteoarthritis. SOFT TISSUES: Marked dorsal soft tissue swelling. IMPRESSION: 1. Marked dorsal soft tissue swelling. 2. Midfoot osteoarthritis. Electronically signed by: Norman Gatlin MD 12/20/2023 09:03 PM EDT RP Workstation: [id number]      IMPRESSION AND PLAN:  Assessment and Plan: * Cellulitis and abscess of right lower extremity - The patient will be admitted to a medical telemetry bed. - The patient has subsequent sepsis due to cellulitis. - Sepsis manifested by fever, tachypnea, tachycardia and leukocytosis. - He will be placed on IV cefepime  and vancomycin . - Pain management will be provided. - Podiatry consult will be obtained. - I notified Dr. Lennie about the patient.  Uncontrolled type 2 diabetes mellitus with hyperglycemia, with long-term current use of insulin  (HCC) -The patient will be placed on supplement coverage with NovoLog . - Will continue basal coverage.  Acute kidney injury superimposed on chronic kidney disease (HCC) --This is superimposed on stage IIIa chronic kidney disease. - The patient will be hydrated with IV lactated ringer . - Will follow BMP. - Will avoid nephrotoxins.  Dyslipidemia - Will continue statin  therapy.  Hypothyroidism - Will continue Synthroid .  Diabetic neuropathy (HCC) - Will continue Lyrica .   DVT prophylaxis: SCDs.  Medical prophylaxis postponed till postoperative  period. Advanced Care Planning:  Code Status: full code.  Family Communication:  The plan of care was discussed in details with the patient (and family). I answered all questions. The patient agreed to proceed with the above mentioned plan. Further management will depend upon hospital course. Disposition Plan: Back to previous home environment Consults called: Podiatry All the records are reviewed and case discussed with ED provider.  Status is: Inpatient  At the time of the admission, it appears that the appropriate admission status for this patient is inpatient.  This is judged to be reasonable and necessary in order to provide the required intensity of service to ensure the patient's safety given the presenting symptoms, physical exam findings and initial radiographic and laboratory data in the context of comorbid conditions.  The patient requires inpatient status due to high intensity of service, high risk of further deterioration and high frequency of surveillance required.  I certify that at the time of admission, it is my clinical judgment that the patient will require inpatient hospital care extending more than 2 midnights.                            Dispo: The patient is from: Home              Anticipated d/c is to: Home              Patient currently is not medically stable to d/c.              Difficult to place patient: No  Madison DELENA Peaches M.D on 12/21/2023 at 12:53 AM  [facility]   From 7 PM-7 AM, contact night-coverage [email or web address]  CC: Primary care physician; Lester, Kacy, NP

## 2023-12-20 NOTE — H&P (Incomplete)
 [place]   PATIENT NAME: Douglas [name]    MR#:  [id number]  DATE OF BIRTH:  17-Sep-1945  DATE OF ADMISSION:  12/20/2023  PRIMARY CARE PHYSICIAN: Gretel App, NP   Patient is coming from: Home  REQUESTING/REFERRING PHYSICIAN: Viviann Mungo, MD  CHIEF COMPLAINT:   Chief Complaint  Patient presents with  . Wound Infection    HISTORY OF PRESENT ILLNESS:  Douglas [name] is a 78 y.o. male with medical history significant for type 2 diabetes mellitus, diastolic dysfunction, depression, GERD, gout, essential hypertension and hypothyroidism, who presented to the emergency room with acute onset of right foot pain and swelling which has been worsening over the last couple weeks.  No fever or chills.  No recent falls or trauma.  No nausea or vomiting or abdominal pain.  No chest pain or palpitations.  No cough or wheezing or dyspnea.  No dysuria, oliguria or hematuria or flank pain.  ED Course: When the patient came to the ER, BP was 152/76 with temperature of 100.2/37.9, heart rate of 115 and otherwise normal vital signs.  Labs revealed mild hyponatremia 133 and blood glucose 228, BUN of 24 and creatinine 1.84 above previous levels and albumin 3.4 with total bili of 3.1.  Lactic acid was 2.6 and later 1.5.  CBC showed leukocytosis of 18.7 with neutrophilia and mild anemia, as well as mild thrombocytopenia slightly worse than previous levels in June of this year. EKG as reviewed by me : None. Imaging: Right foot x-ray showed marked dorsal soft tissue swelling and midfoot arthritis.  The patient was given PAST MEDICAL HISTORY:   Past Medical History:  Diagnosis Date  . Arthritis   . Depression   . Diabetes mellitus with complication (HCC)   . Diabetic neuropathy (HCC)   . Diastolic dysfunction    a. TTE 07/2017: EF 60-65%, mild concentric LVH, no RWMA, Gr1DD, trivial AI, mildly dilated LA, RVSF normal, PASP normal  . Edema    feet/legs  . GERD (gastroesophageal reflux  disease)   . Gout   . History of stress test    a. MV 06/2017: small in size, mild in severity, apical anterior and apical defect that was minimally reversible and most likely represented artifact and less likely ischemia/scar, LVEF 55-65%, low risk, probably normal stress test  . Hypertension   . Hypothyroidism   . Kidney cysts    renal failure 2013  . Kidney stones    Dr. Waddell  . OSA (obstructive sleep apnea)    No oxygen at all in 2-3 years.  . [facility] spotted fever 12/10/2017   Positve IgG in titer on 11/20/17  . S/P cardiac cath 1998   a. no obstructive disease  . Syncope and collapse   . Temporary low platelet count (HCC)     PAST SURGICAL HISTORY:   Past Surgical History:  Procedure Laterality Date  . ANAL FISSURE REPAIR    . BACK SURGERY  1977   rupture disc lumbar spine  . CARDIAC CATHETERIZATION  [facility] with [facility].   SABRA CATARACT EXTRACTION W/PHACO Right 07/16/2016   Procedure: CATARACT EXTRACTION PHACO AND INTRAOCULAR LENS PLACEMENT (IOC);  Surgeon: Steven Dingeldein, MD;  Location: [facility] ORS;  Service: Ophthalmology;  Laterality: Right;  US  01:11 AP% 17.6 CDE 24.83 fluid pack lot # [id number]  . CATARACT EXTRACTION W/PHACO Left 08/13/2016   Procedure: CATARACT EXTRACTION PHACO AND INTRAOCULAR LENS PLACEMENT (IOC) suture placed in left  eye at end of procedure;  Surgeon: Steven Dingeldein, MD;  Location: [facility] ORS;  Service: Ophthalmology;  Laterality: Left;  US  01:55 AP% 22.7 CDE 53.56 fluid pack lot # [id number]  . FINGER AMPUTATION     partial  . FINGER ARTHROPLASTY    . KNEE SURGERY  1993   arthroscopy  . SHOULDER SURGERY Bilateral 1998   arthroscopic right, rotator cuff repair left    SOCIAL HISTORY:   Social History   Tobacco Use  . Smoking status: Never  . Smokeless tobacco: Never  Substance Use Topics  . Alcohol  use: Not Currently    FAMILY HISTORY:   Family History  Problem Relation Age of Onset  .  Breast cancer Mother   . Lung cancer Mother   . Bone cancer Mother   . Heart Problems Mother   . Cancer Mother        breast, lung and rib  . AAA (abdominal aortic aneurysm) Mother   . Heart disease Mother   . Heart attack Father   . Heart disease Father   . Cancer Brother        esophageal  . Heart attack Brother   . Colon cancer Neg Hx   . Liver disease Neg Hx     DRUG ALLERGIES:   Allergies  Allergen Reactions  . Bee Venom Swelling  . Benadryl [Diphenhydramine Hcl (Sleep)] Other (See Comments)    Hyperactivity   . Enalapril Itching    itching  . Gabapentin Palpitations and Rash    rash  . Hctz [Hydrochlorothiazide] Other (See Comments)    Decreased potassium  . Omeprazole Other (See Comments)    Other reaction(s): increased sx on high dose    REVIEW OF SYSTEMS:   ROS As per history of present illness. All pertinent systems were reviewed above. Constitutional, HEENT, cardiovascular, respiratory, GI, GU, musculoskeletal, neuro, psychiatric, endocrine, integumentary and hematologic systems were reviewed and are otherwise negative/unremarkable except for positive findings mentioned above in the HPI.   MEDICATIONS AT HOME:   Prior to Admission medications   Medication Sig Start Date End Date Taking? Authorizing Provider  acetaminophen  (TYLENOL ) 500 MG tablet Take 500 mg by mouth every 6 (six) hours as needed.   Yes [provider]  B Complex Vitamins (BL VITAMIN B COMPLEX PO) Take by mouth daily.   Yes [provider]  calcitRIOL  (ROCALTROL ) 0.25 MCG capsule Take 1 capsule (0.25 mcg total) by mouth daily. 07/20/23  Yes Hope Merle, MD  carvedilol  (COREG ) 6.25 MG tablet TAKE 1 TABLET(6.25 MG) BY MOUTH TWICE DAILY WITH A MEAL 09/18/23  Yes Gretel App, NP  Cholecalciferol  (VITAMIN D3) 50 MCG (2000 UT) TABS Take by mouth daily.   Yes [provider]  clove oil liquid Apply 1 application topically as needed.   Yes [provider]   colchicine  0.6 MG tablet Take 2 tablets by mouth now x 1 dose then take 1 tablet by mouth one hour later 10/01/23  Yes Lester, Kacy, NP  cyanocobalamin  (VITAMIN B12) 1000 MCG/ML injection Inject 1,000 mcg into the muscle every 30 (thirty) days.   Yes [provider]  cyclobenzaprine  (FLEXERIL ) 10 MG tablet Take 1 tablet (10 mg total) by mouth 3 (three) times daily as needed. 10/01/23  Yes Gretel App, NP  dapagliflozin  propanediol ([name] ) 10 MG TABS tablet Take 1 tablet (10 mg total) by mouth daily. 12/08/23  Yes Gretel App, NP  doxazosin  (CARDURA ) 4 MG tablet Take 1 tablet (4 mg total) by  mouth daily. 10/22/23  Yes Gretel App, NP  DULoxetine  (CYMBALTA ) 60 MG capsule TAKE 1 CAPSULE(60 MG) BY MOUTH DAILY 04/02/23  Yes Maribeth Camellia MATSU, MD  esomeprazole  (NEXIUM ) 40 MG capsule Take 1 capsule (40 mg total) by mouth daily at 12 noon. 08/04/13  Yes Vannie Delon LABOR, MD  finasteride  (PROSCAR ) 5 MG tablet TAKE 1 TABLET(5 MG) BY MOUTH AT BEDTIME 05/15/21  Yes Stoioff, Glendia BROCKS, MD  furosemide  (LASIX ) 40 MG tablet TAKE 1 TABLET(40 MG) BY MOUTH DAILY 09/18/23  Yes Gretel App, NP  hydroxypropyl methylcellulose / hypromellose (ISOPTO TEARS / GONIOVISC) 2.5 % ophthalmic solution 1 drop as needed for dry eyes.   Yes [provider]  insulin  degludec ([name]  FLEXTOUCH) 200 UNIT/ML FlexTouch Pen Inject 40 Units into the skin daily. Patient taking differently: Inject 44 Units into the skin daily. 02/19/22  Yes Maribeth Camellia MATSU, MD  levothyroxine  (SYNTHROID ) 100 MCG tablet TAKE 1 TABLET(100 MCG) BY MOUTH DAILY BEFORE BREAKFAST 08/21/23  Yes Gretel App, NP  loratadine  (CLARITIN ) 10 MG tablet Take 10 mg by mouth daily as needed for allergies.   Yes [provider]  losartan  (COZAAR ) 25 MG tablet TAKE 1 TABLET BY MOUTH EVERY MORNING THEN TAKE 2 TABLETS BY MOUTH EVERY EVENING 11/05/23  Yes Gretel App, NP  MAGNESIUM  PO Take 250 mg by mouth 2 (two) times daily.   Yes [provider]   mometasone  (NASONEX ) 50 MCG/ACT nasal spray Place 2 sprays into the nose daily. Patient taking differently: Place 2 sprays into the nose daily as needed. 03/13/22  Yes Maribeth Camellia MATSU, MD  Multiple Vitamins-Minerals (MULTIVITAMIN MEN PO) Take 1 tablet by mouth daily.   Yes [provider]  naproxen sodium (ALEVE) 220 MG tablet Take 220 mg by mouth 2 (two) times daily as needed.   Yes [provider]  pregabalin  (LYRICA ) 225 MG capsule Take 1 capsule (225 mg total) by mouth 2 (two) times daily. 10/01/23  Yes Gretel App, NP  Semaglutide , 1 MG/DOSE, (OZEMPIC , 1 MG/DOSE,) 4 MG/3ML SOPN Inject 1 mg into the skin once a week. Patient taking differently: Inject 2 mg into the skin once a week. 08/01/20  Yes Maribeth Camellia MATSU, MD  tadalafil (CIALIS) 20 MG tablet Take 20 mg by mouth daily as needed. 01/19/20  Yes [provider]  testosterone  cypionate (DEPOTESTOSTERONE CYPIONATE) 200 MG/ML injection Inject 200 mg into the muscle every 14 (fourteen) days. 01/24/20  Yes [provider]  gentamicin  cream (GARAMYCIN ) 0.1 % Apply 1 application. topically 2 (two) times daily. Patient not taking: Reported on 12/20/2023 09/06/21   Janit Thresa HERO, DPM  Insulin  Pen Needle (NOVOFINE PEN NEEDLE) 32G X 6 MM MISC 1 each by Does not apply route 2 (two) times daily. 02/19/22   Maribeth Camellia MATSU, MD  Needle, Disp, (HYPODERMIC NEEDLE 18GX1) 18G X 1 MISC Use every 14 days to draw up testosterone  01/29/22   [provider]  Needle, Disp, (HYPODERMIC NEEDLE 25GX1) 25G X 1 MISC Use every 14 days to administer testosterone  01/29/22   [provider]  pravastatin  (PRAVACHOL ) 20 MG tablet Take 1 tablet (20 mg total) by mouth daily. Patient not taking: Reported on 12/20/2023 07/01/23   Gretel App, NP      VITAL SIGNS:  Blood pressure (!) 152/76, pulse (!) 115, temperature (!) 100.6 F (38.1 C), resp. rate 20, height 5' 11 (1.803 m), weight 120.2 kg, SpO2 98%.  PHYSICAL  EXAMINATION:  Physical Exam  GENERAL:  78 y.o.-year-old patient  lying in the bed with no acute distress.  EYES: Pupils equal, round, reactive to light and accommodation. No scleral icterus. Extraocular muscles intact.  HEENT: Head atraumatic, normocephalic. Oropharynx and nasopharynx clear.  NECK:  Supple, no jugular venous distention. No thyroid  enlargement, no tenderness.  LUNGS: Normal breath sounds bilaterally, no wheezing, rales,rhonchi or crepitation. No use of accessory muscles of respiration.  CARDIOVASCULAR: Regular rate and rhythm, S1, S2 normal. No murmurs, rubs, or gallops.  ABDOMEN: Soft, nondistended, nontender. Bowel sounds present. No organomegaly or mass.  EXTREMITIES: No pedal edema, cyanosis, or clubbing.  NEUROLOGIC: Cranial nerves II through XII are intact. Muscle strength 5/5 in all extremities. Sensation intact. Gait not checked.  PSYCHIATRIC: The patient is alert and oriented x 3.  Normal affect and good eye contact. SKIN: No obvious rash, lesion, or ulcer.   LABORATORY PANEL:   CBC Recent Labs  Lab 12/20/23 2048  WBC 18.7*  HGB 12.5*  HCT 38.6*  PLT 145*   ------------------------------------------------------------------------------------------------------------------  Chemistries  Recent Labs  Lab 12/20/23 2048  NA 133*  K 4.0  CL 98  CO2 24  GLUCOSE 228*  BUN 24*  CREATININE 1.84*  CALCIUM 8.5*  AST 22  ALT 14  ALKPHOS 74  BILITOT 3.1*   ------------------------------------------------------------------------------------------------------------------  Cardiac Enzymes No results for input(s): TROPONINI in the last 168 hours. ------------------------------------------------------------------------------------------------------------------  RADIOLOGY:  DG Foot Complete Right Result Date: 12/20/2023 EXAM: 3 or more VIEW(S) XRAY OF THE RIGHT FOOT 12/20/2023 08:57:41 PM COMPARISON: 07/21/23. CLINICAL HISTORY: Right foot pain, patient states he  has neuropathy and had two toenails pulled. FINDINGS: BONES AND JOINTS: No acute fracture. No focal osseous lesion. No joint dislocation. Plantar and posterior calcaneal spurs. Midfoot osteoarthritis. Hallux valgus. First toe MTP joint osteoarthritis. SOFT TISSUES: Marked dorsal soft tissue swelling. IMPRESSION: 1. Marked dorsal soft tissue swelling. 2. Midfoot osteoarthritis. Electronically signed by: Norman Gatlin MD 12/20/2023 09:03 PM EDT RP Workstation: [id number]      IMPRESSION AND PLAN:  Assessment and Plan: No notes have been filed under this hospital service. Service: Hospitalist      DVT prophylaxis: Lovenox ***  Advanced Care Planning:  Code Status: full code***  Family Communication:  The plan of care was discussed in details with the patient (and family). I answered all questions. The patient agreed to proceed with the above mentioned plan. Further management will depend upon hospital course. Disposition Plan: Back to previous home environment Consults called: none***  All the records are reviewed and case discussed with ED provider.  Status is: Inpatient {Inpatient:23812}   At the time of the admission, it appears that the appropriate admission status for this patient is inpatient.  This is judged to be reasonable and necessary in order to provide the required intensity of service to ensure the patient's safety given the presenting symptoms, physical exam findings and initial radiographic and laboratory data in the context of comorbid conditions.  The patient requires inpatient status due to high intensity of service, high risk of further deterioration and high frequency of surveillance required.  I certify that at the time of admission, it is my clinical judgment that the patient will require inpatient hospital care extending more than 2 midnights.                            Dispo: The patient is from: Home              Anticipated d/c is to: Home  Patient  currently is not medically stable to d/c.              Difficult to place patient: No  Madison DELENA Peaches M.D on 12/20/2023 at 11:59 PM  [facility]   From 7 PM-7 AM, contact night-coverage [email or web address]  CC: Primary care physician; Lester, Kacy, NP

## 2023-12-20 NOTE — Progress Notes (Signed)
 Pharmacy Antibiotic Note  Douglas [name] is a 78 y.o. male admitted on 12/20/2023 with cellulitis.  Pharmacy has been consulted for Cefepime  & Vancomycin  dosing.  Plan: Cefepime  2 gm q12hr per indication & renal fxn.  Pt given Vancomycin  2500 mg once. Vancomycin  1000 mg IV Q 24 hrs. Goal AUC 400-550. Expected AUC: 494.5 SCr used: 1.84, Vd used: 0.5, BMI: 36.9  Pharmacy will continue to follow and will adjust abx dosing whenever warranted.  Temp (24hrs), Avg:100.2 F (37.9 C), Min:100.2 F (37.9 C), Max:100.2 F (37.9 C)   Recent Labs  Lab 12/20/23 2048  WBC 18.7*  CREATININE 1.84*  LATICACIDVEN 2.6*    Estimated Creatinine Clearance: 43.7 mL/min (A) (by C-G formula based on SCr of 1.84 mg/dL (H)).    Allergies  Allergen Reactions   Bee Venom Swelling   Benadryl [Diphenhydramine Hcl (Sleep)] Other (See Comments)    Hyperactivity    Enalapril Itching    itching   Gabapentin Palpitations and Rash    rash   Hctz [Hydrochlorothiazide] Other (See Comments)    Decreased potassium   Omeprazole Other (See Comments)    Other reaction(s): increased sx on high dose    Antimicrobials this admission: 9/07 Ceftriaxone  >> x 1 dose 9/08 Cefepime  >>  9/07 Vancomycin  >>   Microbiology results: 9/07 BCx: Pending  Thank you for allowing pharmacy to be a part of this patient's care.  Rankin CANDIE Dills, PharmD, [facility] 12/20/2023 10:32 PM

## 2023-12-20 NOTE — ED Notes (Signed)
 Called ccmd for pt monitoring

## 2023-12-21 DIAGNOSIS — N179 Acute kidney failure, unspecified: Secondary | ICD-10-CM

## 2023-12-21 DIAGNOSIS — E785 Hyperlipidemia, unspecified: Secondary | ICD-10-CM

## 2023-12-21 DIAGNOSIS — L02415 Cutaneous abscess of right lower limb: Secondary | ICD-10-CM | POA: Diagnosis not present

## 2023-12-21 DIAGNOSIS — E1140 Type 2 diabetes mellitus with diabetic neuropathy, unspecified: Secondary | ICD-10-CM | POA: Insufficient documentation

## 2023-12-21 DIAGNOSIS — E1165 Type 2 diabetes mellitus with hyperglycemia: Secondary | ICD-10-CM

## 2023-12-21 DIAGNOSIS — L03115 Cellulitis of right lower limb: Secondary | ICD-10-CM | POA: Diagnosis not present

## 2023-12-21 LAB — BASIC METABOLIC PANEL WITH GFR
Anion gap: 10 (ref 5–15)
BUN: 23 mg/dL (ref 8–23)
CO2: 22 mmol/L (ref 22–32)
Calcium: 8.5 mg/dL — ABNORMAL LOW (ref 8.9–10.3)
Chloride: 102 mmol/L (ref 98–111)
Creatinine, Ser: 1.59 mg/dL — ABNORMAL HIGH (ref 0.61–1.24)
GFR, Estimated: 44 mL/min — ABNORMAL LOW (ref >60)
Glucose, Bld: 158 mg/dL — ABNORMAL HIGH (ref 70–99)
Potassium: 4 mmol/L (ref 3.5–5.1)
Sodium: 134 mmol/L — ABNORMAL LOW (ref 135–145)

## 2023-12-21 LAB — URINALYSIS, W/ REFLEX TO CULTURE (INFECTION SUSPECTED)
Bilirubin Urine: NEGATIVE
Glucose, UA: >=500 mg/dL — AB
Ketones, ur: NEGATIVE mg/dL
Leukocytes,Ua: NEGATIVE
Nitrite: NEGATIVE
Protein, ur: NEGATIVE mg/dL
Specific Gravity, Urine: 1.015 (ref 1.005–1.030)
pH: 5 (ref 5.0–8.0)

## 2023-12-21 LAB — CBC
HCT: 37.1 % — ABNORMAL LOW (ref 39.0–52.0)
Hemoglobin: 12 g/dL — ABNORMAL LOW (ref 13.0–17.0)
MCH: 28.4 pg (ref 26.0–34.0)
MCHC: 32.3 g/dL (ref 30.0–36.0)
MCV: 87.9 fL (ref 80.0–100.0)
Platelets: 162 K/uL (ref 150–400)
RBC: 4.22 MIL/uL (ref 4.22–5.81)
RDW: 17.3 % — ABNORMAL HIGH (ref 11.5–15.5)
WBC: 19.8 K/uL — ABNORMAL HIGH (ref 4.0–10.5)
nRBC: 0 % (ref 0.0–0.2)

## 2023-12-21 LAB — HEMOGLOBIN A1C
Hgb A1c MFr Bld: 7.5 % — ABNORMAL HIGH (ref 4.8–5.6)
Mean Plasma Glucose: 168.55 mg/dL

## 2023-12-21 LAB — CBG MONITORING, ED: Glucose-Capillary: 203 mg/dL — ABNORMAL HIGH (ref 70–99)

## 2023-12-21 LAB — PROTIME-INR
INR: 1.2 (ref 0.8–1.2)
Prothrombin Time: 15.6 s — ABNORMAL HIGH (ref 11.4–15.2)

## 2023-12-21 LAB — GLUCOSE, CAPILLARY
Glucose-Capillary: 148 mg/dL — ABNORMAL HIGH (ref 70–99)
Glucose-Capillary: 173 mg/dL — ABNORMAL HIGH (ref 70–99)

## 2023-12-21 LAB — CORTISOL-AM, BLOOD: Cortisol - AM: 18.9 ug/dL (ref 6.7–22.6)

## 2023-12-21 MED ORDER — INSULIN ASPART 100 UNIT/ML IJ SOLN
0.0000 [IU] | Freq: Three times a day (TID) | INTRAMUSCULAR | Status: DC
  Administered 2023-12-21: 1 [IU] via SUBCUTANEOUS
  Administered 2023-12-21 – 2023-12-22 (×2): 2 [IU] via SUBCUTANEOUS
  Administered 2023-12-22 (×2): 1 [IU] via SUBCUTANEOUS
  Administered 2023-12-23 (×2): 2 [IU] via SUBCUTANEOUS
  Administered 2023-12-23: 1 [IU] via SUBCUTANEOUS
  Filled 2023-12-21 (×7): qty 1

## 2023-12-21 MED ORDER — POLYETHYLENE GLYCOL 3350 17 G PO PACK: 17.0000 g | PACK | Freq: Every day | ORAL | Status: DC | PRN

## 2023-12-21 MED ORDER — INSULIN ASPART 100 UNIT/ML IJ SOLN: 0.0000 [IU] | Freq: Every day | INTRAMUSCULAR | Status: DC

## 2023-12-21 MED ORDER — VANCOMYCIN HCL 1250 MG/250ML IV SOLN
1250.0000 mg | INTRAVENOUS | Status: DC
  Administered 2023-12-21 – 2023-12-23 (×3): 1250 mg via INTRAVENOUS
  Filled 2023-12-21 (×4): qty 250

## 2023-12-21 NOTE — Consult Note (Signed)
 PODIATRIC SURGERY: CONSULT NOTE  Consulting Physician: Dr. Jerelene  Reason for Consult: R foot wound/  swelling   HPI: Douglas [name] is a 78 y.o. male with PMH significant for DM2 c/b neuropathy, dysatolic dysfunction, depression GERD, gout, HTN, hypothyroidism presenting to the ED for evaluation of wound/ infection concern to the RIGHT FOOT x 3-4 days acutely   BACKGROUND: has been dealing with recurrent calluses / nail issues on the R foot for 'months / years.'   [+ ] Drainage.  How long: x 3 days [+ ] Malodor. How long: x 3 days [+ ] Redness. How long: x 3 days  [- ] Pain. How long: N/A  Onset of wound: gradual   Course: worsening   Prior Treatment: patient directed - solely warm water soaks with epsom salts, no dressings / dressing changes conducted.  Prior Wound Care instructions: none.  Podiatry consulted for evaluation of wound / infection RIGHT foot.   Endorses: F/C since Friday 12/18/23 Denies: N/V  SOB/CP/calf pain.  Last PO: this evening 12/21/23   PMHx:  Past Medical History:  Diagnosis Date   Arthritis    Depression    Diabetes mellitus with complication (HCC)    Diabetic neuropathy (HCC)    Diastolic dysfunction    a. TTE 07/2017: EF 60-65%, mild concentric LVH, no RWMA, Gr1DD, trivial AI, mildly dilated LA, RVSF normal, PASP normal   Edema    feet/legs   GERD (gastroesophageal reflux disease)    Gout    History of stress test    a. MV 06/2017: small in size, mild in severity, apical anterior and apical defect that was minimally reversible and most likely represented artifact and less likely ischemia/scar, LVEF 55-65%, low risk, probably normal stress test   Hypertension    Hypothyroidism    Kidney cysts    renal failure 2013   Kidney stones    Dr. Waddell   OSA (obstructive sleep apnea)    No oxygen at all in 2-3 years.   [place] spotted fever 12/10/2017   Positve IgG in titer on 11/20/17   S/P cardiac cath 1998   a. no obstructive disease    Syncope and collapse    Temporary low platelet count (HCC)     Surgical Hx:  Past Surgical History:  Procedure Laterality Date   ANAL FISSURE REPAIR     BACK SURGERY  1977   rupture disc lumbar spine   CARDIAC CATHETERIZATION  1998   [facility].    CATARACT EXTRACTION W/PHACO Right 07/16/2016   Procedure: CATARACT EXTRACTION PHACO AND INTRAOCULAR LENS PLACEMENT (IOC);  Surgeon: Steven Dingeldein, MD;  Location: [facility] ORS;  Service: Ophthalmology;  Laterality: Right;  US  01:11 AP% 17.6 CDE 24.83 fluid pack lot # [id number]   CATARACT EXTRACTION W/PHACO Left 08/13/2016   Procedure: CATARACT EXTRACTION PHACO AND INTRAOCULAR LENS PLACEMENT (IOC) suture placed in left eye at end of procedure;  Surgeon: Steven Dingeldein, MD;  Location: [facility] ORS;  Service: Ophthalmology;  Laterality: Left;  US  01:55 AP% 22.7 CDE 53.56 fluid pack lot # [id number]   FINGER AMPUTATION     partial   FINGER ARTHROPLASTY     KNEE SURGERY  1993   arthroscopy   SHOULDER SURGERY Bilateral 1998   arthroscopic right, rotator cuff repair left    FHx:  Family History  Problem Relation Age of Onset   Breast cancer Mother    Lung cancer Mother    Bone cancer Mother  Heart Problems Mother    Cancer Mother        breast, lung and rib   AAA (abdominal aortic aneurysm) Mother    Heart disease Mother    Heart attack Father    Heart disease Father    Cancer Brother        esophageal   Heart attack Brother    Colon cancer Neg Hx    Liver disease Neg Hx     Social History:  reports that he has never smoked. He has never used smokeless tobacco. He reports that he does not currently use alcohol . He reports that he does not use drugs.  Allergies:  Allergies  Allergen Reactions   Bee Venom Swelling   Benadryl [Diphenhydramine Hcl (Sleep)] Other (See Comments)    Hyperactivity    Enalapril Itching    itching   Gabapentin Palpitations and Rash    rash   Hctz  [Hydrochlorothiazide] Other (See Comments)    Decreased potassium   Omeprazole Other (See Comments)    Other reaction(s): increased sx on high dose    Medications Prior to Admission  Medication Sig Dispense Refill   acetaminophen  (TYLENOL ) 500 MG tablet Take 500 mg by mouth every 6 (six) hours as needed.     aspirin  EC 325 MG tablet Take 325 mg by mouth daily.     B Complex Vitamins (BL VITAMIN B COMPLEX PO) Take 1 tablet by mouth daily.     calcitRIOL  (ROCALTROL ) 0.25 MCG capsule Take 1 capsule (0.25 mcg total) by mouth daily. 30 capsule 0   carvedilol  (COREG ) 6.25 MG tablet TAKE 1 TABLET(6.25 MG) BY MOUTH TWICE DAILY WITH A MEAL 180 tablet 2   Cholecalciferol  (VITAMIN D3) 50 MCG (2000 UT) TABS Take 1 tablet by mouth daily.     clove oil liquid Apply 1 application topically as needed.     colchicine  0.6 MG tablet Take 2 tablets by mouth now x 1 dose then take 1 tablet by mouth one hour later 20 tablet 0   cyanocobalamin  (VITAMIN B12) 1000 MCG/ML injection Inject 1,000 mcg into the muscle every 30 (thirty) days.     cyclobenzaprine  (FLEXERIL ) 10 MG tablet Take 1 tablet (10 mg total) by mouth 3 (three) times daily as needed. 30 tablet 2   dapagliflozin  propanediol ([name] ) 10 MG TABS tablet Take 1 tablet (10 mg total) by mouth daily. 90 tablet 3   doxazosin  (CARDURA ) 4 MG tablet Take 1 tablet (4 mg total) by mouth daily. 90 tablet 3   DULoxetine  (CYMBALTA ) 60 MG capsule TAKE 1 CAPSULE(60 MG) BY MOUTH DAILY 90 capsule 3   esomeprazole  (NEXIUM ) 40 MG capsule Take 1 capsule (40 mg total) by mouth daily at 12 noon. 90 capsule 3   finasteride  (PROSCAR ) 5 MG tablet TAKE 1 TABLET(5 MG) BY MOUTH AT BEDTIME 90 tablet 3   furosemide  (LASIX ) 40 MG tablet TAKE 1 TABLET(40 MG) BY MOUTH DAILY 90 tablet 1   hydroxypropyl methylcellulose / hypromellose (ISOPTO TEARS / GONIOVISC) 2.5 % ophthalmic solution 1 drop as needed for dry eyes.     insulin  degludec ([name]  FLEXTOUCH) 200 UNIT/ML FlexTouch Pen Inject  40 Units into the skin daily. (Patient taking differently: Inject 44 Units into the skin daily.) 15 mL 3   levothyroxine  (SYNTHROID ) 100 MCG tablet TAKE 1 TABLET(100 MCG) BY MOUTH DAILY BEFORE BREAKFAST 90 tablet 3   loratadine  (CLARITIN ) 10 MG tablet Take 10 mg by mouth daily as needed for allergies.  losartan  (COZAAR ) 25 MG tablet TAKE 1 TABLET BY MOUTH EVERY MORNING THEN TAKE 2 TABLETS BY MOUTH EVERY EVENING 270 tablet 0   Magnesium  250 MG TABS Take 250 mg by mouth 2 (two) times daily.     mometasone  (NASONEX ) 50 MCG/ACT nasal spray Place 2 sprays into the nose daily. (Patient taking differently: Place 2 sprays into the nose daily as needed.) 1 each 12   Multiple Vitamins-Minerals (MULTIVITAMIN MEN PO) Take 1 tablet by mouth daily.     naproxen sodium (ALEVE) 220 MG tablet Take 220 mg by mouth 2 (two) times daily as needed.     pravastatin  (PRAVACHOL ) 20 MG tablet Take 1 tablet (20 mg total) by mouth daily. 90 tablet 3   pregabalin  (LYRICA ) 225 MG capsule Take 1 capsule (225 mg total) by mouth 2 (two) times daily. 180 capsule 1   Semaglutide , 1 MG/DOSE, (OZEMPIC , 1 MG/DOSE,) 4 MG/3ML SOPN Inject 1 mg into the skin once a week. (Patient taking differently: Inject 2 mg into the skin once a week.) 3 mL 3   tadalafil (CIALIS) 20 MG tablet Take 20 mg by mouth daily as needed.     testosterone  cypionate (DEPOTESTOSTERONE CYPIONATE) 200 MG/ML injection Inject 200 mg into the muscle every 14 (fourteen) days.     Insulin  Pen Needle (NOVOFINE PEN NEEDLE) 32G X 6 MM MISC 1 each by Does not apply route 2 (two) times daily. 1 each 3   Needle, Disp, (HYPODERMIC NEEDLE 18GX1) 18G X 1 MISC Use every 14 days to draw up testosterone      Needle, Disp, (HYPODERMIC NEEDLE 25GX1) 25G X 1 MISC Use every 14 days to administer testosterone       Physical Exam: Blood pressure 121/70, pulse 85, temperature 100 F (37.8 C), temperature source Oral, resp. rate 20, height 5' 11 (1.803 m), weight 120.2 kg, SpO2  93%.  Constitutional: Patient appears of normal development and good nutritional status for stated age, well-groomed, and of normal body habitus. Phonating appropriately.   Psychiatric: Alert and oriented to time, place, person and situation. The patient is noted to have good judgment and insight into the hospital visit  FOCUSED LOWER EXTREMITY EXAMINATION:   Neurological:  - Protective sensation diminished / absent - Gross protective sensation diminished bilaterally.  - No focal motor or sensory deficits identified bilaterally.   Vascular:  - Dorsalis Pedis artery on palpation: 2 - Posterior Tibial artery on palpation: 1 - Capillary Filling Times: sluggish - Peripheral edema: localized to RIGHT forefoot / medial aspect - Dependency Rubor: + - Varicosities: mild   Musculoskeletal:  Muscle strength: 5/5 in all 4 quadrants  Ankle Joint ROM: decreased, equinus noted Global foot - TTP: none  Dermatological:  - Skin quality: atrophic - Interdigital spaces: c/d/i   #WOUND 1   Type: Full thickness neuropathic ulceration Location: R 1st Ray with extension to first IDS (+ mild extension sub 2d digit) PTB: - Malodor:+  Active Drainage: yes; If yes, consistency: serous  Pre Debridement Measurements: epithelial tissue with fluctuance Post Debridement Measurements: 6 cm (l) x 7 cm (w) x 0.5 cm (d)  Methods for Debridement: Sharp (#10 blade - sterile), Mechanical (tissue scissor- sterile) Cautery needed(?): No  Debridement conducted without incident by above methods to granular wound base tissue     Post debridement:     Results for orders placed or performed during the hospital encounter of 12/20/23 (from the past 48 hours)  Lactic acid, plasma     Status: Abnormal  Collection Time: 12/20/23  8:48 PM  Result Value Ref Range   Lactic Acid, Venous 2.6 (HH) 0.5 - 1.9 mmol/L    Comment: CRITICAL RESULT CALLED TO, READ BACK BY AND VERIFIED WITH HECTOR GARCIA @ 12/20/2023 2121  AB Performed at [facility], [address] [place], [place] [address]   Comprehensive metabolic panel     Status: Abnormal   Collection Time: 12/20/23  8:48 PM  Result Value Ref Range   Sodium 133 (L) 135 - 145 mmol/L   Potassium 4.0 3.5 - 5.1 mmol/L   Chloride 98 98 - 111 mmol/L   CO2 24 22 - 32 mmol/L   Glucose, Bld 228 (H) 70 - 99 mg/dL    Comment: Glucose reference range applies only to samples taken after fasting for at least 8 hours.   BUN 24 (H) 8 - 23 mg/dL   Creatinine, Ser 8.15 (H) 0.61 - 1.24 mg/dL   Calcium 8.5 (L) 8.9 - 10.3 mg/dL   Total Protein 6.8 6.5 - 8.1 g/dL   Albumin 3.4 (L) 3.5 - 5.0 g/dL   AST 22 15 - 41 U/L   ALT 14 0 - 44 U/L   Alkaline Phosphatase 74 38 - 126 U/L   Total Bilirubin 3.1 (H) 0.0 - 1.2 mg/dL   GFR, Estimated 37 (L) >60 mL/min    Comment: (NOTE) Calculated using the CKD-EPI Creatinine Equation (2021)    Anion gap 11 5 - 15    Comment: Performed at [facility], [address] [place], [place] [address]  CBC with Differential     Status: Abnormal   Collection Time: 12/20/23  8:48 PM  Result Value Ref Range   WBC 18.7 (H) 4.0 - 10.5 K/uL   RBC 4.35 4.22 - 5.81 MIL/uL   Hemoglobin 12.5 (L) 13.0 - 17.0 g/dL   HCT 61.3 (L) 60.9 - 47.9 %   MCV 88.7 80.0 - 100.0 fL   MCH 28.7 26.0 - 34.0 pg   MCHC 32.4 30.0 - 36.0 g/dL   RDW 82.7 (H) 88.4 - 84.4 %   Platelets 145 (L) 150 - 400 K/uL   nRBC 0.0 0.0 - 0.2 %   Neutrophils Relative % 83 %   Neutro Abs 15.6 (H) 1.7 - 7.7 K/uL   Lymphocytes Relative 6 %   Lymphs Abs 1.0 0.7 - 4.0 K/uL   Monocytes Relative 10 %   Monocytes Absolute 1.8 (H) 0.1 - 1.0 K/uL   Eosinophils Relative 0 %   Eosinophils Absolute 0.1 0.0 - 0.5 K/uL   Basophils Relative 0 %   Basophils Absolute 0.1 0.0 - 0.1 K/uL   Immature Granulocytes 1 %   Abs Immature Granulocytes 0.23 (H) 0.00 - 0.07 K/uL    Comment: Performed at [facility], [address] [place], [place] [address]  Culture,  blood (routine x 2)     Status: None (Preliminary result)   Collection Time: 12/20/23  8:48 PM   Specimen: BLOOD  Result Value Ref Range   Specimen Description BLOOD RIGHT ANTECUBITAL    Special Requests      BOTTLES DRAWN AEROBIC AND ANAEROBIC Blood Culture results may not be optimal due to an inadequate volume of blood received in culture bottles   Culture      NO GROWTH < 12 HOURS Performed at [facility], [address] [place], [place] [address]    Report Status PENDING   Hemoglobin A1c     Status: Abnormal  Collection Time: 12/20/23  8:48 PM  Result Value Ref Range   Hgb A1c MFr Bld 7.5 (H) 4.8 - 5.6 %    Comment: (NOTE) Diagnosis of Diabetes The following HbA1c ranges recommended by the American Diabetes Association (ADA) may be used as an aid in the diagnosis of diabetes mellitus.  Hemoglobin             Suggested A1C NGSP%              Diagnosis  <5.7                   Non Diabetic  5.7-6.4                Pre-Diabetic  >6.4                   Diabetic  <7.0                   Glycemic control for                       adults with diabetes.     Mean Plasma Glucose 168.55 mg/dL    Comment: Performed at [facility], [address] [place], [place] [address]  Culture, blood (routine x 2)     Status: None (Preliminary result)   Collection Time: 12/20/23  9:18 PM   Specimen: Left Antecubital; Blood  Result Value Ref Range   Specimen Description LEFT ANTECUBITAL    Special Requests      BOTTLES DRAWN AEROBIC AND ANAEROBIC Blood Culture adequate volume   Culture      NO GROWTH < 12 HOURS Performed at [facility], [address] [place], [place] [address]    Report Status PENDING   Lactic acid, plasma     Status: None   Collection Time: 12/20/23 10:55 PM  Result Value Ref Range   Lactic Acid, Venous 1.5 0.5 - 1.9 mmol/L    Comment: Performed at [facility], [address] [place], [place] [address]  Urinalysis, w/ Reflex  to Culture (Infection Suspected) -Urine, Clean Catch     Status: Abnormal   Collection Time: 12/21/23  4:01 AM  Result Value Ref Range   Specimen Source URINE, CLEAN CATCH    Color, Urine YELLOW (A) YELLOW   APPearance CLEAR (A) CLEAR   Specific Gravity, Urine 1.015 1.005 - 1.030   pH 5.0 5.0 - 8.0   Glucose, UA >=500 (A) NEGATIVE mg/dL   Hgb urine dipstick SMALL (A) NEGATIVE   Bilirubin Urine NEGATIVE NEGATIVE   Ketones, ur NEGATIVE NEGATIVE mg/dL   Protein, ur NEGATIVE NEGATIVE mg/dL   Nitrite NEGATIVE NEGATIVE   Leukocytes,Ua NEGATIVE NEGATIVE   RBC / HPF 0-5 0 - 5 RBC/hpf   WBC, UA 0-5 0 - 5 WBC/hpf    Comment:        Reflex urine culture not performed if WBC <=10, OR if Squamous epithelial cells >5. If Squamous epithelial cells >5 suggest recollection.    Bacteria, UA RARE (A) NONE SEEN   Squamous Epithelial / HPF 0-5 0 - 5 /HPF   Mucus PRESENT     Comment: Performed at [facility], [address] [place], [place] [address]  Protime-INR     Status: Abnormal   Collection Time: 12/21/23  5:44 AM  Result Value Ref Range   Prothrombin Time 15.6 (H) 11.4 - 15.2 seconds   INR 1.2 0.8 -  1.2    Comment: (NOTE) INR goal varies based on device and disease states. Performed at [facility], [address] [place], [place] [address]   Cortisol-am, blood     Status: None   Collection Time: 12/21/23  5:44 AM  Result Value Ref Range   Cortisol - AM 18.9 6.7 - 22.6 ug/dL    Comment: Performed at [facility], [address] [place], [place] [address]  Basic metabolic panel     Status: Abnormal   Collection Time: 12/21/23  5:44 AM  Result Value Ref Range   Sodium 134 (L) 135 - 145 mmol/L   Potassium 4.0 3.5 - 5.1 mmol/L   Chloride 102 98 - 111 mmol/L   CO2 22 22 - 32 mmol/L   Glucose, Bld 158 (H) 70 - 99 mg/dL    Comment: Glucose reference range applies only to samples taken after fasting for at least 8 hours.   BUN 23 8 - 23 mg/dL   Creatinine,  Ser 8.40 (H) 0.61 - 1.24 mg/dL   Calcium 8.5 (L) 8.9 - 10.3 mg/dL   GFR, Estimated 44 (L) >60 mL/min    Comment: (NOTE) Calculated using the CKD-EPI Creatinine Equation (2021)    Anion gap 10 5 - 15    Comment: Performed at [facility], [address] [place], [place] [address]  CBC     Status: Abnormal   Collection Time: 12/21/23  5:44 AM  Result Value Ref Range   WBC 19.8 (H) 4.0 - 10.5 K/uL   RBC 4.22 4.22 - 5.81 MIL/uL   Hemoglobin 12.0 (L) 13.0 - 17.0 g/dL   HCT 62.8 (L) 60.9 - 47.9 %   MCV 87.9 80.0 - 100.0 fL   MCH 28.4 26.0 - 34.0 pg   MCHC 32.3 30.0 - 36.0 g/dL   RDW 82.6 (H) 88.4 - 84.4 %   Platelets 162 150 - 400 K/uL   nRBC 0.0 0.0 - 0.2 %    Comment: Performed at [facility], [address] [place], [place] [address]  CBG monitoring, ED     Status: Abnormal   Collection Time: 12/21/23 12:15 PM  Result Value Ref Range   Glucose-Capillary 203 (H) 70 - 99 mg/dL    Comment: Glucose reference range applies only to samples taken after fasting for at least 8 hours.  Glucose, capillary     Status: Abnormal   Collection Time: 12/21/23  4:56 PM  Result Value Ref Range   Glucose-Capillary 148 (H) 70 - 99 mg/dL    Comment: Glucose reference range applies only to samples taken after fasting for at least 8 hours.  Glucose, capillary     Status: Abnormal   Collection Time: 12/21/23  9:27 PM  Result Value Ref Range   Glucose-Capillary 173 (H) 70 - 99 mg/dL    Comment: Glucose reference range applies only to samples taken after fasting for at least 8 hours.   DG Foot Complete Right Result Date: 12/20/2023 EXAM: 3 or more VIEW(S) XRAY OF THE RIGHT FOOT 12/20/2023 08:57:41 PM COMPARISON: 07/21/23. CLINICAL HISTORY: Right foot pain, patient states he has neuropathy and had two toenails pulled. FINDINGS: BONES AND JOINTS: No acute fracture. No focal osseous lesion. No joint dislocation. Plantar and posterior calcaneal spurs. Midfoot osteoarthritis. Hallux valgus.  First toe MTP joint osteoarthritis. SOFT TISSUES: Marked dorsal soft tissue swelling. IMPRESSION: 1. Marked dorsal soft tissue swelling. 2. Midfoot osteoarthritis. Electronically signed by: Norman Gatlin MD 12/20/2023 09:03 PM  EDT RP Workstation: [id number]    Assessment  Assessment: Sepsis in the setting of diabetic foot infection with abscess + cellulitis of the RIGHT foot Superficial abscess status post bedside I&D with evacuation of serous fluid No radiographic evidence of osteomyelitis No probe-to-bone on clinical exam Mild superficial fat necrosis noted, but no deeper seated infection at this time.    Plan  - ABX: Appreciate Medicine / ID / Pharmacy assist with antibiotic stewardship and appropriate coverage.  - Wound culture obtained / order placed 12/21/23 - MRI if clinical concern for deep space infection, further abscess, or osteomyelitis develops. - Diet: Per primary.  - Activity: Heel WBAT to the RIGHT lower extremity in post op shoe (order placed)  - Wound Care: podiatry to re-eval on 12/22/23.  Instructions:  Frequency: Daily dressing changes.  Materials: Betadine , gauze, ABD pad, Kerlix (gauze roll), 4inch ACE Paint area with betadine .  Cover with gauze 4x4 and overlying ABD pad Anchor dressings with kerlix and overlying loosely placed ACE bandage.   Dispo: TBD - clinical response *If patient fails to improve on antibiotics and wound care, discuss surgical intervention pending MRI results. Currently no indication for operative debridement beyond bedside I&D given absence of deep infection and patient tolerance.   Greig KANDICE Blush 12/21/2023, 6:33 PM

## 2023-12-21 NOTE — Progress Notes (Signed)
 Pharmacy Antibiotic Note  Douglas [name] is a 78 y.o. male admitted on 12/20/2023 with cellulitis.  Pharmacy has been consulted for Cefepime  & Vancomycin  dosing.  Plan: Continue cefepime  2 grams IV every 12 hours Change vancomycin  to 1250 mg IV every 24 hours Estimated AUC 543.8, Cmin 14.6 IBW, Scr 1.59, Vd 0.5 (BMI 36.8) Vancomycin  levels at steady state or as clinically indicated Follow renal function and cultures for adjustments  Pharmacy will continue to follow and will adjust abx dosing whenever warranted.  Temp (24hrs), Avg:99.8 F (37.7 C), Min:98.5 F (36.9 C), Max:100.6 F (38.1 C)   Recent Labs  Lab 12/20/23 2048 12/20/23 2255 12/21/23 0544  WBC 18.7*  --  19.8*  CREATININE 1.84*  --  1.59*  LATICACIDVEN 2.6* 1.5  --     Estimated Creatinine Clearance: 50.5 mL/min (A) (by C-G formula based on SCr of 1.59 mg/dL (H)).    Allergies  Allergen Reactions   Bee Venom Swelling   Benadryl [Diphenhydramine Hcl (Sleep)] Other (See Comments)    Hyperactivity    Enalapril Itching    itching   Gabapentin Palpitations and Rash    rash   Hctz [Hydrochlorothiazide] Other (See Comments)    Decreased potassium   Omeprazole Other (See Comments)    Other reaction(s): increased sx on high dose    Antimicrobials this admission: 9/07 Ceftriaxone  >> x 1 dose 9/08 Cefepime  >>  9/07 Vancomycin  >>   Microbiology results: 9/07 BCx: ngtd  Thank you for allowing pharmacy to be a part of this patient's care.  Kayla JULIANNA Blew, PharmD, BCPS 12/21/2023 7:54 AM

## 2023-12-21 NOTE — Plan of Care (Signed)
  Problem: Fluid Volume: Goal: Hemodynamic stability will improve Outcome: Not Progressing   Problem: Clinical Measurements: Goal: Diagnostic test results will improve Outcome: Not Progressing Goal: Signs and symptoms of infection will decrease Outcome: Not Progressing   Problem: Respiratory: Goal: Ability to maintain adequate ventilation will improve Outcome: Not Progressing   Problem: Education: Goal: Ability to describe self-care measures that may prevent or decrease complications (Diabetes Survival Skills Education) will improve Outcome: Not Progressing Goal: Individualized Educational Video(s) Outcome: Not Progressing   Problem: Coping: Goal: Ability to adjust to condition or change in health will improve Outcome: Not Progressing   Problem: Fluid Volume: Goal: Ability to maintain a balanced intake and output will improve Outcome: Not Progressing   Problem: Health Behavior/Discharge Planning: Goal: Ability to identify and utilize available resources and services will improve Outcome: Not Progressing Goal: Ability to manage health-related needs will improve Outcome: Not Progressing   Problem: Metabolic: Goal: Ability to maintain appropriate glucose levels will improve Outcome: Not Progressing   Problem: Nutritional: Goal: Maintenance of adequate nutrition will improve Outcome: Not Progressing Goal: Progress toward achieving an optimal weight will improve Outcome: Not Progressing   Problem: Skin Integrity: Goal: Risk for impaired skin integrity will decrease Outcome: Not Progressing   Problem: Tissue Perfusion: Goal: Adequacy of tissue perfusion will improve Outcome: Not Progressing   Problem: Education: Goal: Knowledge of General Education information will improve Description: Including pain rating scale, medication(s)/side effects and non-pharmacologic comfort measures Outcome: Not Progressing   Problem: Health Behavior/Discharge Planning: Goal: Ability  to manage health-related needs will improve Outcome: Not Progressing   Problem: Clinical Measurements: Goal: Ability to maintain clinical measurements within normal limits will improve Outcome: Not Progressing Goal: Will remain free from infection Outcome: Not Progressing Goal: Diagnostic test results will improve Outcome: Not Progressing Goal: Respiratory complications will improve Outcome: Not Progressing Goal: Cardiovascular complication will be avoided Outcome: Not Progressing   Problem: Activity: Goal: Risk for activity intolerance will decrease Outcome: Not Progressing   Problem: Nutrition: Goal: Adequate nutrition will be maintained Outcome: Not Progressing   Problem: Coping: Goal: Level of anxiety will decrease Outcome: Not Progressing   Problem: Elimination: Goal: Will not experience complications related to bowel motility Outcome: Not Progressing Goal: Will not experience complications related to urinary retention Outcome: Not Progressing   Problem: Pain Managment: Goal: General experience of comfort will improve and/or be controlled Outcome: Not Progressing   Problem: Safety: Goal: Ability to remain free from injury will improve Outcome: Not Progressing   Problem: Skin Integrity: Goal: Risk for impaired skin integrity will decrease Outcome: Not Progressing

## 2023-12-21 NOTE — Assessment & Plan Note (Signed)
 Will continue statin therapy

## 2023-12-21 NOTE — Assessment & Plan Note (Signed)
-  The patient will be placed on supplement coverage with NovoLog. - Will continue basal coverage.

## 2023-12-21 NOTE — Assessment & Plan Note (Signed)
--  This is superimposed on stage IIIa chronic kidney disease. - The patient will be hydrated with IV lactated ringer . - Will follow BMP. - Will avoid nephrotoxins.

## 2023-12-21 NOTE — Assessment & Plan Note (Signed)
-   Will continue Synthroid .

## 2023-12-21 NOTE — Assessment & Plan Note (Addendum)
-   The patient will be admitted to a medical telemetry bed. - The patient has subsequent sepsis due to cellulitis. - Sepsis manifested by fever, tachypnea, tachycardia and leukocytosis. - He will be placed on IV cefepime  and vancomycin . - Pain management will be provided. - Podiatry consult will be obtained. - I notified Dr. Lennie about the patient.

## 2023-12-21 NOTE — Progress Notes (Signed)
 PROGRESS NOTE    [name]  FMW:[id number] DOB: 08/02/1945 DOA: 12/20/2023 PCP: Gretel App, NP  Chief Complaint  Douglas [name] presents with   Wound Infection    Hospital Course:  Douglas [name] is a 78 y.o. Caucasian male with medical history significant for type 2 diabetes mellitus, diastolic dysfunction, depression, GERD, gout, essential hypertension and hypothyroidism presented with right foot pain and swelling.  Admitted for right foot cellulitis, hospital course has been  Subjective: Douglas [name] was examined at bedside, new to me today.  Spouse present at the bedside. Denies any new complaints today.  Pending podiatry evaluation for further management.  Continue IV antibiotics   Objective: Vitals:   12/21/23 0307 12/21/23 0448 12/21/23 0500 12/21/23 1014  BP: 103/66  136/77 (!) 104/49  Pulse: 86  91 94  Resp: 18  (!) 23 13  Temp:  98.5 F (36.9 C)  (!) 100.4 F (38 C)  TempSrc:    Oral  SpO2: 97%  95% 98%  Weight:      Height:       No intake or output data in the 24 hours ending 12/21/23 1020 Filed Weights   12/20/23 2039  Weight: 120.2 kg    Examination:  GENERAL: Douglas [name], Caucasian male Douglas [name] lying in the bed with no acute distress.  NECK:  Supple, no jugular venous distention. No thyroid  enlargement, no tenderness.  LUNGS: Normal breath sounds bilaterally, no wheezing, rales,rhonchi or crepitation CARDIOVASCULAR: Regular rate and rhythm, S1, S2 normal. No murmurs, rubs, or gallops.  ABDOMEN: Soft, nondistended, nontender. Bowel sounds present. No organomegaly or mass.  EXTREMITIES: No leg edema, and no cyanosis, or clubbing.  NEUROLOGIC: Cranial nerves II through XII are intact. Muscle strength 5/5 in all extremities. Sensation intact. Gait not checked.  SKIN: Right dorsal foot erythema mainly on the medial side mostly on the big toe with diffuse swelling and plantar first MTP swelling with mild fluctuation and whitish discoloration suspicious  of abscess  Assessment & Plan:  Principal Problem:   Cellulitis and abscess of right lower extremity Active Problems:   Uncontrolled type 2 diabetes mellitus with hyperglycemia, with long-term current use of insulin  (HCC)   Acute kidney injury superimposed on chronic kidney disease (HCC)   Dyslipidemia   Hypothyroidism   Diabetic neuropathy (HCC)  Sepsis due to Cellulitis and abscess of right lower extremity - Lactic acidosis resolved, Leukocytosis with left shift - XR right foot marked dorsal soft tissue swelling - Continue IV vancomycin , cefepime  - Podiatry recs pending - Pain management - Follow blood cultures   Uncontrolled type 2 diabetes mellitus with hyperglycemia, with long-term current use of insulin  (HCC) - HbA1c 7.5 - Home regimen Ozempic , [name]  44 units - Continue Lantus  44 units, SSI   AKI on chronic kidney disease - This is superimposed on stage IIIa chronic kidney disease. - Cr 1.84 -> 1.59 - S/p IV fluids, cautious with hx heart failure - Hold Lasix , losartan  - Monitor Cr   Dyslipidemia - statin therapy.   Hypothyroidism - Synthroid .   Diabetic neuropathy (HCC) - continue Lyrica   Gout - On colchicine  as needed  DVT prophylaxis: Lovenox  SQ   Code Status: Full Code Disposition:  TBD  Consultants:  Treatment Team:  Consulting Physician: Lennie Barter, DPM  Procedures:  None  Antimicrobials:  Anti-infectives (From admission, onward)    Start     Dose/Rate Route Frequency Ordered Stop   12/21/23 2000  vancomycin  (VANCOCIN ) IVPB 1000 mg/200 mL premix  Status:  Discontinued  1,000 mg 200 mL/hr over 60 Minutes Intravenous  Once 12/20/23 2238 12/20/23 2239   12/21/23 2000  vancomycin  (VANCOCIN ) IVPB 1000 mg/200 mL premix  Status:  Discontinued        1,000 mg 200 mL/hr over 60 Minutes Intravenous Every 24 hours 12/20/23 2239 12/21/23 0757   12/21/23 2000  vancomycin  (VANCOREADY) IVPB 1250 mg/250 mL        1,250 mg 166.7 mL/hr over 90  Minutes Intravenous Every 24 hours 12/21/23 0757     12/21/23 0600  ceFEPIme  (MAXIPIME ) 2 g in sodium chloride  0.9 % 100 mL IVPB        2 g 200 mL/hr over 30 Minutes Intravenous Every 12 hours 12/20/23 2238     12/20/23 2230  vancomycin  (VANCOCIN ) IVPB 1000 mg/200 mL premix  Status:  Discontinued        1,000 mg 200 mL/hr over 60 Minutes Intravenous  Once 12/20/23 2218 12/20/23 2223   12/20/23 2230  ceFEPIme  (MAXIPIME ) 2 g in sodium chloride  0.9 % 100 mL IVPB  Status:  Discontinued        2 g 200 mL/hr over 30 Minutes Intravenous  Once 12/20/23 2218 12/20/23 2223   12/20/23 2230  vancomycin  (VANCOREADY) IVPB 1500 mg/300 mL        1,500 mg 150 mL/hr over 120 Minutes Intravenous  Once 12/20/23 2224 12/21/23 0057   12/20/23 2115  cefTRIAXone  (ROCEPHIN ) 2 g in sodium chloride  0.9 % 100 mL IVPB        2 g 200 mL/hr over 30 Minutes Intravenous Once 12/20/23 2107 12/20/23 2153   12/20/23 2115  vancomycin  (VANCOCIN ) IVPB 1000 mg/200 mL premix        1,000 mg 200 mL/hr over 60 Minutes Intravenous  Once 12/20/23 2107 12/20/23 2258       Data Reviewed: I have personally reviewed following labs and imaging studies CBC: Recent Labs  Lab 12/20/23 2048 12/21/23 0544  WBC 18.7* 19.8*  NEUTROABS 15.6*  --   HGB 12.5* 12.0*  HCT 38.6* 37.1*  MCV 88.7 87.9  PLT 145* 162   Basic Metabolic Panel: Recent Labs  Lab 12/20/23 2048 12/21/23 0544  NA 133* 134*  K 4.0 4.0  CL 98 102  CO2 24 22  GLUCOSE 228* 158*  BUN 24* 23  CREATININE 1.84* 1.59*  CALCIUM 8.5* 8.5*   GFR: Estimated Creatinine Clearance: 50.5 mL/min (A) (by C-G formula based on SCr of 1.59 mg/dL (H)). Liver Function Tests: Recent Labs  Lab 12/20/23 2048  AST 22  ALT 14  ALKPHOS 74  BILITOT 3.1*  PROT 6.8  ALBUMIN 3.4*   CBG: No results for input(s): GLUCAP in the last 168 hours.  Recent Results (from the past 240 hours)  Culture, blood (routine x 2)     Status: None (Preliminary result)   Collection Time:  12/20/23  8:48 PM   Specimen: BLOOD  Result Value Ref Range Status   Specimen Description BLOOD RIGHT ANTECUBITAL  Final   Special Requests   Final    BOTTLES DRAWN AEROBIC AND ANAEROBIC Blood Culture results may not be optimal due to an inadequate volume of blood received in culture bottles   Culture   Final    NO GROWTH < 12 HOURS Performed at [facility], [address] [place], [place] [address]    Report Status PENDING  Incomplete  Culture, blood (routine x 2)     Status: None (Preliminary result)   Collection Time: 12/20/23  9:18  PM   Specimen: Left Antecubital; Blood  Result Value Ref Range Status   Specimen Description LEFT ANTECUBITAL  Final   Special Requests   Final    BOTTLES DRAWN AEROBIC AND ANAEROBIC Blood Culture adequate volume   Culture   Final    NO GROWTH < 12 HOURS Performed at [facility], [address] [place], [place] [address]    Report Status PENDING  Incomplete     Radiology Studies: DG Foot Complete Right Result Date: 12/20/2023 EXAM: 3 or more VIEW(S) XRAY OF THE RIGHT FOOT 12/20/2023 08:57:41 PM COMPARISON: 07/21/23. CLINICAL HISTORY: Right foot pain, Douglas [name] states he has neuropathy and had two toenails pulled. FINDINGS: BONES AND JOINTS: No acute fracture. No focal osseous lesion. No joint dislocation. Plantar and posterior calcaneal spurs. Midfoot osteoarthritis. Hallux valgus. First toe MTP joint osteoarthritis. SOFT TISSUES: Marked dorsal soft tissue swelling. IMPRESSION: 1. Marked dorsal soft tissue swelling. 2. Midfoot osteoarthritis. Electronically signed by: Norman Gatlin MD 12/20/2023 09:03 PM EDT RP Workstation: [id number]    Scheduled Meds:  calcitRIOL   0.25 mcg Oral Daily   carvedilol   6.25 mg Oral BID WC   cholecalciferol   1,000 Units Oral Daily   dapagliflozin  propanediol  10 mg Oral Daily   doxazosin   4 mg Oral Daily   DULoxetine   60 mg Oral Daily   enoxaparin  (LOVENOX ) injection  60 mg Subcutaneous Q24H    finasteride   5 mg Oral QHS   furosemide   40 mg Oral Daily   insulin  glargine  44 Units Subcutaneous Daily   levothyroxine   100 mcg Oral Q0600   multivitamin with minerals   Oral Daily   pantoprazole   40 mg Oral Q1200   pravastatin   20 mg Oral q1800   pregabalin   225 mg Oral BID   Continuous Infusions:  ceFEPime  (MAXIPIME ) IV Stopped (12/21/23 0552)   lactated ringers  150 mL/hr (12/21/23 0750)   vancomycin        LOS: 1 day  MDM: Douglas [name] is high risk for one or more organ failure.  They necessitate ongoing hospitalization for continued IV therapies and subsequent lab monitoring. Total time spent interpreting labs and vitals, reviewing the medical record, coordinating care amongst consultants and care team members, directly assessing and discussing care with the Douglas [name] and/or family: 55 min  Laree Lock, MD [facility]  To contact the attending physician between 7A-7P please use Epic Chat. To contact the covering physician during after hours 7P-7A, please review Amion.  12/21/2023, 10:20 AM   *This document has been created with the assistance of dictation software. Please excuse typographical errors. *

## 2023-12-21 NOTE — ED Notes (Signed)
 This EDT assisted pt to [facility]. Pt transferred to [facility] with moderate assistance. Pt repositioned in bed and states no other needs at this time.

## 2023-12-21 NOTE — Assessment & Plan Note (Signed)
Will continue Lyrica.

## 2023-12-22 ENCOUNTER — Telehealth: Payer: Self-pay

## 2023-12-22 DIAGNOSIS — L03115 Cellulitis of right lower limb: Secondary | ICD-10-CM | POA: Diagnosis not present

## 2023-12-22 DIAGNOSIS — L02415 Cutaneous abscess of right lower limb: Secondary | ICD-10-CM | POA: Diagnosis not present

## 2023-12-22 LAB — CBC
HCT: 33.5 % — ABNORMAL LOW (ref 39.0–52.0)
Hemoglobin: 11 g/dL — ABNORMAL LOW (ref 13.0–17.0)
MCH: 28.9 pg (ref 26.0–34.0)
MCHC: 32.8 g/dL (ref 30.0–36.0)
MCV: 88.2 fL (ref 80.0–100.0)
Platelets: 152 K/uL (ref 150–400)
RBC: 3.8 MIL/uL — ABNORMAL LOW (ref 4.22–5.81)
RDW: 17.2 % — ABNORMAL HIGH (ref 11.5–15.5)
WBC: 15.4 K/uL — ABNORMAL HIGH (ref 4.0–10.5)
nRBC: 0 % (ref 0.0–0.2)

## 2023-12-22 LAB — BASIC METABOLIC PANEL WITH GFR
Anion gap: 11 (ref 5–15)
BUN: 23 mg/dL (ref 8–23)
CO2: 22 mmol/L (ref 22–32)
Calcium: 8.4 mg/dL — ABNORMAL LOW (ref 8.9–10.3)
Chloride: 100 mmol/L (ref 98–111)
Creatinine, Ser: 1.69 mg/dL — ABNORMAL HIGH (ref 0.61–1.24)
GFR, Estimated: 41 mL/min — ABNORMAL LOW (ref >60)
Glucose, Bld: 123 mg/dL — ABNORMAL HIGH (ref 70–99)
Potassium: 3.7 mmol/L (ref 3.5–5.1)
Sodium: 133 mmol/L — ABNORMAL LOW (ref 135–145)

## 2023-12-22 LAB — GLUCOSE, CAPILLARY
Glucose-Capillary: 124 mg/dL — ABNORMAL HIGH (ref 70–99)
Glucose-Capillary: 153 mg/dL — ABNORMAL HIGH (ref 70–99)
Glucose-Capillary: 128 mg/dL — ABNORMAL HIGH (ref 70–99)
Glucose-Capillary: 141 mg/dL — ABNORMAL HIGH (ref 70–99)

## 2023-12-22 MED ORDER — DOCUSATE SODIUM 100 MG PO CAPS
100.0000 mg | ORAL_CAPSULE | Freq: Two times a day (BID) | ORAL | Status: DC
  Administered 2023-12-22 – 2023-12-24 (×5): 100 mg via ORAL
  Filled 2023-12-22 (×5): qty 1

## 2023-12-22 NOTE — Plan of Care (Signed)
  Problem: Fluid Volume: Goal: Hemodynamic stability will improve Outcome: Progressing   Problem: Clinical Measurements: Goal: Diagnostic test results will improve Outcome: Progressing Goal: Signs and symptoms of infection will decrease Outcome: Progressing   Problem: Respiratory: Goal: Ability to maintain adequate ventilation will improve Outcome: Progressing   Problem: Education: Goal: Ability to describe self-care measures that may prevent or decrease complications (Diabetes Survival Skills Education) will improve Outcome: Progressing Goal: Individualized Educational Video(s) Outcome: Progressing   Problem: Fluid Volume: Goal: Ability to maintain a balanced intake and output will improve Outcome: Progressing   Problem: Health Behavior/Discharge Planning: Goal: Ability to identify and utilize available resources and services will improve Outcome: Progressing Goal: Ability to manage health-related needs will improve Outcome: Progressing   Problem: Metabolic: Goal: Ability to maintain appropriate glucose levels will improve Outcome: Progressing   Problem: Nutritional: Goal: Maintenance of adequate nutrition will improve Outcome: Progressing Goal: Progress toward achieving an optimal weight will improve Outcome: Progressing   Problem: Skin Integrity: Goal: Risk for impaired skin integrity will decrease Outcome: Progressing   Problem: Health Behavior/Discharge Planning: Goal: Ability to manage health-related needs will improve Outcome: Progressing   Problem: Clinical Measurements: Goal: Ability to maintain clinical measurements within normal limits will improve Outcome: Progressing Goal: Will remain free from infection Outcome: Progressing Goal: Diagnostic test results will improve Outcome: Progressing Goal: Respiratory complications will improve Outcome: Progressing Goal: Cardiovascular complication will be avoided Outcome: Progressing   Problem:  Activity: Goal: Risk for activity intolerance will decrease Outcome: Progressing   Problem: Nutrition: Goal: Adequate nutrition will be maintained Outcome: Progressing   Problem: Coping: Goal: Level of anxiety will decrease Outcome: Progressing   Problem: Elimination: Goal: Will not experience complications related to bowel motility Outcome: Progressing Goal: Will not experience complications related to urinary retention Outcome: Progressing

## 2023-12-22 NOTE — Progress Notes (Signed)
 PROGRESS NOTE    Douglas [name]  FMW:[id number] DOB: Sep 06, 1945 DOA: 12/20/2023 PCP: Gretel App, NP  Chief Complaint  Patient presents with   Wound Infection    Hospital Course:  Douglas [name] is a 78 y.o. Caucasian male with medical history significant for type 2 diabetes mellitus, diastolic dysfunction, depression, GERD, gout, essential hypertension and hypothyroidism presented with right foot pain and swelling.  Admitted for right foot cellulitis, hospital course has been  Subjective: Patient was examined at bedside, denies any new complaints.  Continue IV antibiotics   Objective: Vitals:   12/22/23 0026 12/22/23 0431 12/22/23 0804 12/22/23 1502  BP: 119/64 128/80 122/78 111/64  Pulse: 88 84 94 80  Resp: 20 20 17 20   Temp: 100 F (37.8 C) 99.1 F (37.3 C) 98.2 F (36.8 C) 98 F (36.7 C)  TempSrc:   Oral   SpO2: 96% 97% 98% 98%  Weight:      Height:        Intake/Output Summary (Last 24 hours) at 12/22/2023 1929 Last data filed at 12/22/2023 1500 Gross per 24 hour  Intake 450 ml  Output --  Net 450 ml   Filed Weights   12/20/23 2039  Weight: 120.2 kg    Examination:  GENERAL: 78 y.o.-year-old, Caucasian male patient lying in the bed with no acute distress.  NECK:  Supple, no jugular venous distention. No thyroid  enlargement, no tenderness.  LUNGS: Normal breath sounds bilaterally, no wheezing, rales,rhonchi or crepitation CARDIOVASCULAR: Regular rate and rhythm, S1, S2 normal. No murmurs, rubs, or gallops.  ABDOMEN: Soft, nondistended, nontender. Bowel sounds present. No organomegaly or mass.  EXTREMITIES: No leg edema, and no cyanosis, or clubbing.  NEUROLOGIC: Cranial nerves II through XII are intact. Muscle strength 5/5 in all extremities. Sensation intact. Gait not checked.  SKIN: Right dorsal foot erythema mainly on the medial side mostly on the big toe with diffuse swelling and plantar first MTP swelling with mild fluctuation and whitish  discoloration suspicious of abscess  Assessment & Plan:  Principal Problem:   Cellulitis and abscess of right lower extremity Active Problems:   Uncontrolled type 2 diabetes mellitus with hyperglycemia, with long-term current use of insulin  (HCC)   Acute kidney injury superimposed on chronic kidney disease (HCC)   Dyslipidemia   Hypothyroidism   Diabetic neuropathy (HCC)  Sepsis due to Cellulitis and abscess of right lower extremity - Lactic acidosis resolved, Leukocytosis with left shift - improving - XR right foot marked dorsal soft tissue swelling - Continue IV vancomycin , cefepime  - Seen by Podiatry, appreciate recs - Pain management - Follow wound cultures, blood cultures   Uncontrolled type 2 diabetes mellitus with hyperglycemia, with long-term current use of insulin  (HCC) - HbA1c 7.5 - Home regimen Ozempic , [name]  44 units - Continue Lantus  44 units, SSI   AKI on chronic kidney disease - This is superimposed on stage IIIa chronic kidney disease. - Cr 1.84 -> 1.59 -> 1.69 - S/p IV fluids, cautious with hx heart failure - Hold Lasix , losartan  - Pending UA, urine osm, urine sodium - Monitor Cr  A.fib, new onset - Patient denies history of atrial fibrillation - CHA2DS2-VASc 5 - TSH pending - Rate controlled, needs anticoagulation for stroke prophylaxis, will start DOAC if no intervention per Podiatry   Dyslipidemia - statin therapy.   Hypothyroidism - Synthroid .   Diabetic neuropathy (HCC) - continue Lyrica   Gout - On colchicine  as needed  DVT prophylaxis: Lovenox  SQ   Code Status: Full Code Disposition:  TBD  Consultants:  Treatment Team:  Consulting Physician: Lennie Barter, DPM  Procedures:  None  Antimicrobials:  Anti-infectives (From admission, onward)    Start     Dose/Rate Route Frequency Ordered Stop   12/21/23 2000  vancomycin  (VANCOCIN ) IVPB 1000 mg/200 mL premix  Status:  Discontinued        1,000 mg 200 mL/hr over 60 Minutes  Intravenous  Once 12/20/23 2238 12/20/23 2239   12/21/23 2000  vancomycin  (VANCOCIN ) IVPB 1000 mg/200 mL premix  Status:  Discontinued        1,000 mg 200 mL/hr over 60 Minutes Intravenous Every 24 hours 12/20/23 2239 12/21/23 0757   12/21/23 2000  vancomycin  (VANCOREADY) IVPB 1250 mg/250 mL        1,250 mg 166.7 mL/hr over 90 Minutes Intravenous Every 24 hours 12/21/23 0757     12/21/23 0600  ceFEPIme  (MAXIPIME ) 2 g in sodium chloride  0.9 % 100 mL IVPB        2 g 200 mL/hr over 30 Minutes Intravenous Every 12 hours 12/20/23 2238     12/20/23 2230  vancomycin  (VANCOCIN ) IVPB 1000 mg/200 mL premix  Status:  Discontinued        1,000 mg 200 mL/hr over 60 Minutes Intravenous  Once 12/20/23 2218 12/20/23 2223   12/20/23 2230  ceFEPIme  (MAXIPIME ) 2 g in sodium chloride  0.9 % 100 mL IVPB  Status:  Discontinued        2 g 200 mL/hr over 30 Minutes Intravenous  Once 12/20/23 2218 12/20/23 2223   12/20/23 2230  vancomycin  (VANCOREADY) IVPB 1500 mg/300 mL        1,500 mg 150 mL/hr over 120 Minutes Intravenous  Once 12/20/23 2224 12/21/23 0057   12/20/23 2115  cefTRIAXone  (ROCEPHIN ) 2 g in sodium chloride  0.9 % 100 mL IVPB        2 g 200 mL/hr over 30 Minutes Intravenous Once 12/20/23 2107 12/20/23 2153   12/20/23 2115  vancomycin  (VANCOCIN ) IVPB 1000 mg/200 mL premix        1,000 mg 200 mL/hr over 60 Minutes Intravenous  Once 12/20/23 2107 12/20/23 2258       Data Reviewed: I have personally reviewed following labs and imaging studies CBC: Recent Labs  Lab 12/20/23 2048 12/21/23 0544 12/22/23 0343  WBC 18.7* 19.8* 15.4*  NEUTROABS 15.6*  --   --   HGB 12.5* 12.0* 11.0*  HCT 38.6* 37.1* 33.5*  MCV 88.7 87.9 88.2  PLT 145* 162 152   Basic Metabolic Panel: Recent Labs  Lab 12/20/23 2048 12/21/23 0544 12/22/23 0343  NA 133* 134* 133*  K 4.0 4.0 3.7  CL 98 102 100  CO2 24 22 22   GLUCOSE 228* 158* 123*  BUN 24* 23 23  CREATININE 1.84* 1.59* 1.69*  CALCIUM 8.5* 8.5* 8.4*    GFR: Estimated Creatinine Clearance: 47.5 mL/min (A) (by C-G formula based on SCr of 1.69 mg/dL (H)). Liver Function Tests: Recent Labs  Lab 12/20/23 2048  AST 22  ALT 14  ALKPHOS 74  BILITOT 3.1*  PROT 6.8  ALBUMIN 3.4*   CBG: Recent Labs  Lab 12/21/23 1656 12/21/23 2127 12/22/23 0804 12/22/23 1137 12/22/23 1624  GLUCAP 148* 173* 124* 153* 128*    Recent Results (from the past 240 hours)  Culture, blood (routine x 2)     Status: None (Preliminary result)   Collection Time: 12/20/23  8:48 PM   Specimen: BLOOD  Result Value Ref Range Status   Specimen Description BLOOD  RIGHT ANTECUBITAL  Final   Special Requests   Final    BOTTLES DRAWN AEROBIC AND ANAEROBIC Blood Culture results may not be optimal due to an inadequate volume of blood received in culture bottles   Culture   Final    NO GROWTH < 12 HOURS Performed at [facility], [address] [place], [place] [address]    Report Status PENDING  Incomplete  Culture, blood (routine x 2)     Status: None (Preliminary result)   Collection Time: 12/20/23  9:18 PM   Specimen: Left Antecubital; Blood  Result Value Ref Range Status   Specimen Description LEFT ANTECUBITAL  Final   Special Requests   Final    BOTTLES DRAWN AEROBIC AND ANAEROBIC Blood Culture adequate volume   Culture   Final    NO GROWTH < 12 HOURS Performed at [facility], [address] [place], [place] [address]    Report Status PENDING  Incomplete  Aerobic/Anaerobic Culture w Gram Stain (surgical/deep wound)     Status: None (Preliminary result)   Collection Time: 12/21/23 11:00 PM   Specimen: Foot; Wound  Result Value Ref Range Status   Specimen Description   Final    FOOT Performed at [facility], [address] [place], [place] [address]    Special Requests   Final    RIGHT SIDE, IMMUNOCOMPROMISED Performed at [facility], [address] [place], [place] [address]    Gram Stain   Final     RARE WBC PRESENT, PREDOMINANTLY PMN RARE GRAM POSITIVE COCCI Performed at [facility], [address], [place], [place] [address]    Culture PENDING  Incomplete   Report Status PENDING  Incomplete     Radiology Studies: DG Foot Complete Right Result Date: 12/20/2023 EXAM: 3 or more VIEW(S) XRAY OF THE RIGHT FOOT 12/20/2023 08:57:41 PM COMPARISON: 07/21/23. CLINICAL HISTORY: Right foot pain, patient states he has neuropathy and had two toenails pulled. FINDINGS: BONES AND JOINTS: No acute fracture. No focal osseous lesion. No joint dislocation. Plantar and posterior calcaneal spurs. Midfoot osteoarthritis. Hallux valgus. First toe MTP joint osteoarthritis. SOFT TISSUES: Marked dorsal soft tissue swelling. IMPRESSION: 1. Marked dorsal soft tissue swelling. 2. Midfoot osteoarthritis. Electronically signed by: Norman Gatlin MD 12/20/2023 09:03 PM EDT RP Workstation: [id number]    Scheduled Meds:  calcitRIOL   0.25 mcg Oral Daily   carvedilol   6.25 mg Oral BID WC   cholecalciferol   1,000 Units Oral Daily   dapagliflozin  propanediol  10 mg Oral Daily   docusate sodium   100 mg Oral BID   doxazosin   4 mg Oral Daily   DULoxetine   60 mg Oral Daily   enoxaparin  (LOVENOX ) injection  60 mg Subcutaneous Q24H   finasteride   5 mg Oral QHS   insulin  aspart  0-5 Units Subcutaneous QHS   insulin  aspart  0-9 Units Subcutaneous TID WC   insulin  glargine  44 Units Subcutaneous Daily   levothyroxine   100 mcg Oral Q0600   multivitamin with minerals   Oral Daily   pantoprazole   40 mg Oral Q1200   pravastatin   20 mg Oral q1800   pregabalin   225 mg Oral BID   Continuous Infusions:  ceFEPime  (MAXIPIME ) IV 2 g (12/22/23 1711)   vancomycin  Stopped (12/21/23 2302)     LOS: 2 days  MDM: Patient is high risk for one or more organ failure.  They necessitate ongoing hospitalization for continued IV therapies and subsequent lab monitoring. Total time  spent interpreting labs and vitals, reviewing the medical  record, coordinating care amongst consultants and care team members, directly assessing and discussing care with the patient and/or family: 55 min  Laree Lock, MD [facility]  To contact the attending physician between 7A-7P please use Epic Chat. To contact the covering physician during after hours 7P-7A, please review Amion.  12/22/2023, 7:29 PM   *This document has been created with the assistance of dictation software. Please excuse typographical errors. *

## 2023-12-22 NOTE — Plan of Care (Signed)

## 2023-12-22 NOTE — Telephone Encounter (Signed)
 [employer] forms placed in provider to be signed folder

## 2023-12-22 NOTE — Progress Notes (Signed)
 PODIATRY: PROGRESS NOTE     O/N: NAEON  Subjective:  Patient resting comfortably at bedside. Reports that he has been doing better and slept well overnight.  Denies F/C/N/V/SOB/CP. Denies acute calf pain.    PHYSICAL EXAMINATION: BP 122/78 (BP Location: Left Arm)   Pulse 94   Temp 98.2 F (36.8 C) (Oral)   Resp 17   Ht 5' 11 (1.803 m)   Wt 120.2 kg   SpO2 98%   BMI 36.96 kg/m ? GEN: NAD. AOX3. ? RESP: Non-labored breathing on RA.? ABD: NT/ND of all four quadrants.? NEURO: Moving all four extremities spontaneously. ? ? FOCUSED LOWER EXTREMITY EXAMINATION:    Neurological:  - Protective sensation diminished / absent - Gross protective sensation diminished bilaterally.  - No focal motor or sensory deficits identified bilaterally.    Vascular:  - Dorsalis Pedis artery on palpation: 2 - Posterior Tibial artery on palpation: 1 - Capillary Filling Times: sluggish - Peripheral edema: localized to RIGHT forefoot / medial aspect - Dependency Rubor: + - Varicosities: mild     Musculoskeletal:  Muscle strength: 5/5 in all 4 quadrants  Ankle Joint ROM: decreased, equinus noted Global foot - TTP: none   Dermatological:  - Skin quality: atrophic - Interdigital spaces: c/d/i    #WOUND 1    Type: Full thickness neuropathic ulceration Location: R 1st Ray with extension to first IDS (+ mild extension sub 2d digit) PTB: - Malodor:+  Active Drainage: yes; If yes, consistency: serous Wound Measurements: 6 cm (l) x 7 cm (w) x 0.5 cm (d)        ??  Results for orders placed or performed during the hospital encounter of 12/20/23  Culture, blood (routine x 2)     Status: None (Preliminary result)   Collection Time: 12/20/23  8:48 PM   Specimen: BLOOD  Result Value Ref Range Status   Specimen Description BLOOD RIGHT ANTECUBITAL  Final   Special Requests   Final    BOTTLES DRAWN AEROBIC AND ANAEROBIC Blood Culture results may not be optimal due to an inadequate volume of  blood received in culture bottles   Culture   Final    NO GROWTH < 12 HOURS Performed at [facility], [address] [place], [place] [address]    Report Status PENDING  Incomplete  Culture, blood (routine x 2)     Status: None (Preliminary result)   Collection Time: 12/20/23  9:18 PM   Specimen: Left Antecubital; Blood  Result Value Ref Range Status   Specimen Description LEFT ANTECUBITAL  Final   Special Requests   Final    BOTTLES DRAWN AEROBIC AND ANAEROBIC Blood Culture adequate volume   Culture   Final    NO GROWTH < 12 HOURS Performed at [facility], [address] [place], [place] [address]    Report Status PENDING  Incomplete  Aerobic/Anaerobic Culture w Gram Stain (surgical/deep wound)     Status: None (Preliminary result)   Collection Time: 12/21/23 11:00 PM   Specimen: Foot; Wound  Result Value Ref Range Status   Specimen Description   Final    FOOT Performed at [facility], [address], [place], [place] [address]    Special Requests   Final    RIGHT SIDE, IMMUNOCOMPROMISED Performed at [facility], [address] [place], [place] [address]    Gram Stain   Final    RARE WBC PRESENT, PREDOMINANTLY PMN RARE GRAM POSITIVE COCCI Performed at [facility], [address] [place], [place] [address]    Culture PENDING  Incomplete   Report Status PENDING  Incomplete      ASSESSMENT:?  Assessment   Assessment: Sepsis in the setting of diabetic foot infection with abscess + cellulitis of the RIGHT foot Superficial abscess status post bedside I&D 12/21/23 with evacuation of serous fluid No radiographic evidence of osteomyelitis No probe-to-bone on clinical exam Mild superficial fat necrosis noted, but no deeper seated infection at this time.       Plan   - ABX: Appreciate Medicine / ID / Pharmacy assist with antibiotic stewardship and appropriate coverage.  - Wound culture obtained / order placed  12/21/23 - MRI if clinical concern for deep space infection, further abscess, or osteomyelitis develops. - Diet: Per primary.  - Activity: Heel WBAT to the RIGHT lower extremity in post op shoe (order placed)  - Wound Care:  Instructions:  Frequency: Daily dressing changes.  Materials: Betadine , gauze, ABD pad, Kerlix (gauze roll), 4inch ACE Paint area with betadine .  Cover with gauze 4x4 and overlying ABD pad Anchor dressings with kerlix and overlying loosely placed ACE bandage.    Dispo: TBD - clinical response - would like to see better improvement of leukocytosis prior to DC  Currently no indication for operative debridement beyond bedside I&D given absence of deep infection and patient tolerance.

## 2023-12-23 DIAGNOSIS — L03115 Cellulitis of right lower limb: Secondary | ICD-10-CM | POA: Diagnosis not present

## 2023-12-23 DIAGNOSIS — L02415 Cutaneous abscess of right lower limb: Secondary | ICD-10-CM | POA: Diagnosis not present

## 2023-12-23 LAB — URINALYSIS, COMPLETE (UACMP) WITH MICROSCOPIC
Bacteria, UA: NONE SEEN
Bilirubin Urine: NEGATIVE
Glucose, UA: >=500 mg/dL — AB
Hgb urine dipstick: NEGATIVE
Ketones, ur: NEGATIVE mg/dL
Leukocytes,Ua: NEGATIVE
Nitrite: NEGATIVE
Protein, ur: NEGATIVE mg/dL
RBC / HPF: 0 RBC/hpf (ref 0–5)
Specific Gravity, Urine: 1.007 (ref 1.005–1.030)
Squamous Epithelial / HPF: 0 /HPF (ref 0–5)
pH: 5 (ref 5.0–8.0)

## 2023-12-23 LAB — BASIC METABOLIC PANEL WITH GFR
Anion gap: 8 (ref 5–15)
BUN: 26 mg/dL — ABNORMAL HIGH (ref 8–23)
CO2: 23 mmol/L (ref 22–32)
Calcium: 8.4 mg/dL — ABNORMAL LOW (ref 8.9–10.3)
Chloride: 102 mmol/L (ref 98–111)
Creatinine, Ser: 1.61 mg/dL — ABNORMAL HIGH (ref 0.61–1.24)
GFR, Estimated: 44 mL/min — ABNORMAL LOW (ref >60)
Glucose, Bld: 158 mg/dL — ABNORMAL HIGH (ref 70–99)
Potassium: 3.4 mmol/L — ABNORMAL LOW (ref 3.5–5.1)
Sodium: 133 mmol/L — ABNORMAL LOW (ref 135–145)

## 2023-12-23 LAB — GLUCOSE, CAPILLARY
Glucose-Capillary: 174 mg/dL — ABNORMAL HIGH (ref 70–99)
Glucose-Capillary: 163 mg/dL — ABNORMAL HIGH (ref 70–99)
Glucose-Capillary: 124 mg/dL — ABNORMAL HIGH (ref 70–99)
Glucose-Capillary: 120 mg/dL — ABNORMAL HIGH (ref 70–99)

## 2023-12-23 LAB — CBC
HCT: 31.9 % — ABNORMAL LOW (ref 39.0–52.0)
Hemoglobin: 10.4 g/dL — ABNORMAL LOW (ref 13.0–17.0)
MCH: 28 pg (ref 26.0–34.0)
MCHC: 32.6 g/dL (ref 30.0–36.0)
MCV: 86 fL (ref 80.0–100.0)
Platelets: 190 K/uL (ref 150–400)
RBC: 3.71 MIL/uL — ABNORMAL LOW (ref 4.22–5.81)
RDW: 17 % — ABNORMAL HIGH (ref 11.5–15.5)
WBC: 10.8 K/uL — ABNORMAL HIGH (ref 4.0–10.5)
nRBC: 0 % (ref 0.0–0.2)

## 2023-12-23 LAB — MAGNESIUM: Magnesium: 2.3 mg/dL (ref 1.7–2.4)

## 2023-12-23 LAB — OSMOLALITY, URINE: Osmolality, Ur: 238 mosm/kg — ABNORMAL LOW (ref 300–900)

## 2023-12-23 LAB — C-REACTIVE PROTEIN: CRP: 19.3 mg/dL — ABNORMAL HIGH (ref <1.0)

## 2023-12-23 LAB — TSH: TSH: 2.713 u[IU]/mL (ref 0.350–4.500)

## 2023-12-23 LAB — SODIUM, URINE, RANDOM: Sodium, Ur: <30 mmol/L

## 2023-12-23 MED ORDER — SODIUM CHLORIDE 0.9 % IV SOLN: INTRAVENOUS | Status: DC

## 2023-12-23 MED ORDER — PREGABALIN 50 MG PO CAPS
100.0000 mg | ORAL_CAPSULE | Freq: Two times a day (BID) | ORAL | Status: DC
  Administered 2023-12-23 – 2023-12-24 (×2): 100 mg via ORAL
  Filled 2023-12-23 (×2): qty 2

## 2023-12-23 NOTE — Progress Notes (Addendum)
 PODIATRY: PROGRESS NOTE     O/N: NAEON  Subjective:  Patient resting comfortably at bedside. Reports that he has been doing better and slept well overnight.  Denies F/C/N/V/SOB/CP. Denies acute calf pain.    PHYSICAL EXAMINATION: BP (!) 142/78 (BP Location: Right Arm)   Pulse 70   Temp 97.9 F (36.6 C) (Oral)   Resp 16   Ht 5' 11 (1.803 m)   Wt 120.2 kg   SpO2 99%   BMI 36.96 kg/m ? GEN: NAD. AOX3. ? RESP: Non-labored breathing on RA.? ABD: NT/ND of all four quadrants.? NEURO: Moving all four extremities spontaneously. ? ? FOCUSED LOWER EXTREMITY EXAMINATION:    Neurological:  - Protective sensation diminished / absent - Gross protective sensation diminished bilaterally.  - No focal motor or sensory deficits identified bilaterally.    Vascular:  - Dorsalis Pedis artery on palpation: 2 - Posterior Tibial artery on palpation: 1 - Capillary Filling Times: sluggish - Peripheral edema: localized to RIGHT forefoot / medial aspect - Dependency Rubor: + - Varicosities: mild     Musculoskeletal:  Muscle strength: 5/5 in all 4 quadrants  Ankle Joint ROM: decreased, equinus noted Global foot - TTP: none   Dermatological:  - Skin quality: atrophic - Interdigital spaces: c/d/i    #WOUND 1    Type: Full thickness neuropathic ulceration Location: R 1st Ray with extension to first IDS (+ mild extension sub 2d digit) PTB: - Malodor:+  Active Drainage: yes; If yes, consistency: serous Pre- Debridement Wound Measurements: 6 cm (l) x 7 cm (w) x 0.5 cm (d)  Post Debridement Measurements: 7 cm (l) x 8 cm (w) x 1 cm (d)  Methods for Debridement: Sharp (#10 blade - sterile), Mechanical (tissue scissor- sterile) Cautery needed(?): No    Debridement  with removal of necrotic / nonviable tissue including epidermis, dermis, and into/ including subcutaneous tissue via sterile #15 blade and sterile scissors (no need for cautery), conducted without incident by above methods to  granular wound base tissue           ??  Results for orders placed or performed during the hospital encounter of 12/20/23  Culture, blood (routine x 2)     Status: None (Preliminary result)   Collection Time: 12/20/23  8:48 PM   Specimen: BLOOD  Result Value Ref Range Status   Specimen Description BLOOD RIGHT ANTECUBITAL  Final   Special Requests   Final    BOTTLES DRAWN AEROBIC AND ANAEROBIC Blood Culture results may not be optimal due to an inadequate volume of blood received in culture bottles   Culture   Final    NO GROWTH 3 DAYS Performed at [facility], [address] [place], [place] [address]    Report Status PENDING  Incomplete  Culture, blood (routine x 2)     Status: None (Preliminary result)   Collection Time: 12/20/23  9:18 PM   Specimen: Left Antecubital; Blood  Result Value Ref Range Status   Specimen Description LEFT ANTECUBITAL  Final   Special Requests   Final    BOTTLES DRAWN AEROBIC AND ANAEROBIC Blood Culture adequate volume   Culture   Final    NO GROWTH 3 DAYS Performed at [facility], [address] [place], [place] [address]    Report Status PENDING  Incomplete  Aerobic/Anaerobic Culture w Gram Stain (surgical/deep wound)     Status: None (Preliminary result)   Collection Time: 12/21/23 11:00 PM   Specimen: Foot; Wound  Result  Value Ref Range Status   Specimen Description   Final    FOOT Performed at [facility], [address], [place], [place] [address]    Special Requests   Final    RIGHT SIDE, IMMUNOCOMPROMISED Performed at [facility], [address] [place], [place] [address]    Gram Stain   Final    RARE WBC PRESENT, PREDOMINANTLY PMN RARE GRAM POSITIVE COCCI    Culture   Final    FEW STAPHYLOCOCCUS AUREUS SUSCEPTIBILITIES TO FOLLOW Performed at [facility], [address], [place], [place] [address]    Report Status PENDING  Incomplete      ASSESSMENT:?  Assessment    Assessment: Diabetic foot infection + cellulitis of the RIGHT foot Bedside I&D 12/23/23 with removal of necrotic / nonviable tissue including epidermis, dermis, and into/ including subcutaneous tissue via sterile #15 blade and sterile scissors (no need for cautery), as noted in PE.  No radiographic evidence of osteomyelitis No probe-to-bone on clinical exam Superficial fat necrosis noted in three areas of the wound, no tunneling noted of these points, though they are deep which is better evaluated via MRI (kindly ordered by hospitalist)   Plan   - ABX: Appreciate Medicine / ID / Pharmacy assist with antibiotic stewardship and appropriate coverage.  - f/u wound culture 12/21/23 - f/u MRI  - Activity: Heel WBAT to the RIGHT lower extremity in post op shoe (order placed)  - Wound Care:  Instructions:  Frequency: Daily dressing changes.  Materials: Betadine , gauze, ABD pad, Kerlix (gauze roll), 4inch ACE Paint area with betadine .  Cover with gauze 4x4 and overlying ABD pad Anchor dressings with kerlix and overlying loosely placed ACE bandage.    Dispo: TBD - clinical response - will conduct dressing change on 9/10 to review clinical impression of wound.   Currently no indication for operative debridement beyond bedside I&D given absence of deep infection and patient tolerance.

## 2023-12-23 NOTE — Progress Notes (Signed)
   12/23/23 1000  Mobility  Activity Ambulated with assistance;Stood at bedside;Respositioned in chair;Pivoted/transferred from chair to bed  Level of Assistance Contact guard assist, steadying assist  Assistive Device Front wheel walker  Distance Ambulated (ft) 60 ft  Range of Motion/Exercises Active Assistive  Activity Response Tolerated fair  Mobility Referral Yes  Mobility visit 1 Mobility  Mobility Specialist Start Time (ACUTE ONLY) 0930  Mobility Specialist Stop Time (ACUTE ONLY) 1004  Mobility Specialist Time Calculation (min) (ACUTE ONLY) 34 min   Mobility Specialist - Progress Note Pt was in the bed upon arrival. Pt agreed to mobility. Pt also stated he feels a bit of pain on his right foot post surgery and on his left foot. Pain scale on the non surgery foot more than foot with wound. Pt ambulated well with 2WW, but pause for recovery to let pain subside. Pt initial was repositioned in the chair, but stated to be positioned back in the bed due to cleaning services entry. Needs were in reach upon departure.  Clem Rodes Mobility Specialist 12/23/23, 10:59 AM

## 2023-12-23 NOTE — Progress Notes (Signed)
 PROGRESS NOTE    Douglas [name]  FMW:[id number] DOB: 1945/05/25 DOA: 12/20/2023 PCP: Gretel App, NP  Chief Complaint  Patient presents with   Wound Infection    Hospital Course:  Douglas [name] is a 78 y.o. Caucasian male with medical history significant for type 2 diabetes mellitus, diastolic dysfunction, depression, GERD, gout, essential hypertension and hypothyroidism presented with right foot pain and swelling.  Admitted for right foot cellulitis, hospital course has been  Subjective: No complaints. Confused.   Objective: Vitals:   12/22/23 2002 12/23/23 0010 12/23/23 0524 12/23/23 0825  BP: 121/68 120/64 113/70 131/65  Pulse: 76 80 68 63  Resp: 18 18 18 16   Temp: 98.2 F (36.8 C) (!) 100.6 F (38.1 C) 97.7 F (36.5 C) 98.2 F (36.8 C)  TempSrc: Oral     SpO2: 100% 96% 98% 95%  Weight:      Height:        Intake/Output Summary (Last 24 hours) at 12/23/2023 1521 Last data filed at 12/23/2023 1300 Gross per 24 hour  Intake 540 ml  Output --  Net 540 ml   Filed Weights   12/20/23 2039  Weight: 120.2 kg    Examination:  GENERAL: 78 y.o.-year-old, Caucasian male patient lying in the bed with no acute distress.  NECK:  Supple,  LUNGS: Normal breath sounds bilaterally, no wheezing, rales,rhonchi or crepitation CARDIOVASCULAR: Regular rate and rhythm, S1, S2 normal.   ABDOMEN: Soft, obese, nontender.   EXTREMITIES: No leg edema, and no cyanosis, or clubbing.  NEUROLOGIC:  moving all 4. Oriented to self and year but not to place SKIN:      Assessment & Plan:  Principal Problem:   Cellulitis and abscess of right lower extremity Active Problems:   Uncontrolled type 2 diabetes mellitus with hyperglycemia, with long-term current use of insulin  (HCC)   Acute kidney injury superimposed on chronic kidney disease (HCC)   Dyslipidemia   Hypothyroidism   Diabetic neuropathy (HCC)  Sepsis due to Cellulitis and abscess of right lower extremity I/d  culture growing staph - Lactic acidosis resolved, Leukocytosis with left shift - XR right foot marked dorsal soft tissue swelling - Continue IV vancomycin ,  - Podiatry recs pending - Pain management - Follow blood cultures - will order mri - pt consult   Type 2 diabetes mellitus with hyperglycemia, with long-term current use of insulin  (HCC) - HbA1c 7.5 - Home regimen Ozempic , [name]  44 units - Continue Lantus  44 units, SSI - hold home farxiga    AKI on chronic kidney disease - This is superimposed on stage IIIa chronic kidney disease. - Cr 1.84 -> 1.59 - S/p IV fluids, cautious with hx heart failure - Hold Lasix , losartan  - Monitor Cr  A.fib, new onset Per prior hospitalist, on tele. No EKG. Not currently on tele - check EKG, place on tele - CHA2DS2-VASc 5 - TSH pending - Rate controlled on coreg  - consider anticoag if no podiatric surgery  Acute encephalopathy Likely 2/2 infection - decrease pregabalin    Dyslipidemia - statin therapy.   Hypothyroidism - Synthroid .   Diabetic neuropathy (HCC) - Lyrica   Gout - On colchicine  as needed  DVT prophylaxis: Lovenox  SQ   Code Status: Full Code Disposition:  TBD Contact: wife virginia  updated telephonically 9/10  Consultants:  Treatment Team:  Consulting Physician: Lennie Barter, DPM  Procedures:  None  Antimicrobials:  Anti-infectives (From admission, onward)    Start     Dose/Rate Route Frequency Ordered Stop   12/21/23 2000  vancomycin  (VANCOCIN ) IVPB 1000 mg/200 mL premix  Status:  Discontinued        1,000 mg 200 mL/hr over 60 Minutes Intravenous  Once 12/20/23 2238 12/20/23 2239   12/21/23 2000  vancomycin  (VANCOCIN ) IVPB 1000 mg/200 mL premix  Status:  Discontinued        1,000 mg 200 mL/hr over 60 Minutes Intravenous Every 24 hours 12/20/23 2239 12/21/23 0757   12/21/23 2000  vancomycin  (VANCOREADY) IVPB 1250 mg/250 mL        1,250 mg 166.7 mL/hr over 90 Minutes Intravenous Every 24 hours  12/21/23 0757     12/21/23 0600  ceFEPIme  (MAXIPIME ) 2 g in sodium chloride  0.9 % 100 mL IVPB  Status:  Discontinued        2 g 200 mL/hr over 30 Minutes Intravenous Every 12 hours 12/20/23 2238 12/23/23 1110   12/20/23 2230  vancomycin  (VANCOCIN ) IVPB 1000 mg/200 mL premix  Status:  Discontinued        1,000 mg 200 mL/hr over 60 Minutes Intravenous  Once 12/20/23 2218 12/20/23 2223   12/20/23 2230  ceFEPIme  (MAXIPIME ) 2 g in sodium chloride  0.9 % 100 mL IVPB  Status:  Discontinued        2 g 200 mL/hr over 30 Minutes Intravenous  Once 12/20/23 2218 12/20/23 2223   12/20/23 2230  vancomycin  (VANCOREADY) IVPB 1500 mg/300 mL        1,500 mg 150 mL/hr over 120 Minutes Intravenous  Once 12/20/23 2224 12/21/23 0057   12/20/23 2115  cefTRIAXone  (ROCEPHIN ) 2 g in sodium chloride  0.9 % 100 mL IVPB        2 g 200 mL/hr over 30 Minutes Intravenous Once 12/20/23 2107 12/20/23 2153   12/20/23 2115  vancomycin  (VANCOCIN ) IVPB 1000 mg/200 mL premix        1,000 mg 200 mL/hr over 60 Minutes Intravenous  Once 12/20/23 2107 12/20/23 2258       Data Reviewed: I have personally reviewed following labs and imaging studies CBC: Recent Labs  Lab 12/20/23 2048 12/21/23 0544 12/22/23 0343 12/23/23 0335  WBC 18.7* 19.8* 15.4* 10.8*  NEUTROABS 15.6*  --   --   --   HGB 12.5* 12.0* 11.0* 10.4*  HCT 38.6* 37.1* 33.5* 31.9*  MCV 88.7 87.9 88.2 86.0  PLT 145* 162 152 190   Basic Metabolic Panel: Recent Labs  Lab 12/20/23 2048 12/21/23 0544 12/22/23 0343 12/23/23 0335  NA 133* 134* 133* 133*  K 4.0 4.0 3.7 3.4*  CL 98 102 100 102  CO2 24 22 22 23   GLUCOSE 228* 158* 123* 158*  BUN 24* 23 23 26*  CREATININE 1.84* 1.59* 1.69* 1.61*  CALCIUM 8.5* 8.5* 8.4* 8.4*  MG  --   --   --  2.3   GFR: Estimated Creatinine Clearance: 49.9 mL/min (A) (by C-G formula based on SCr of 1.61 mg/dL (H)). Liver Function Tests: Recent Labs  Lab 12/20/23 2048  AST 22  ALT 14  ALKPHOS 74  BILITOT 3.1*  PROT  6.8  ALBUMIN 3.4*   CBG: Recent Labs  Lab 12/22/23 1137 12/22/23 1624 12/22/23 2051 12/23/23 0824 12/23/23 1110  GLUCAP 153* 128* 141* 174* 163*    Recent Results (from the past 240 hours)  Culture, blood (routine x 2)     Status: None (Preliminary result)   Collection Time: 12/20/23  8:48 PM   Specimen: BLOOD  Result Value Ref Range Status   Specimen Description BLOOD RIGHT ANTECUBITAL  Final  Special Requests   Final    BOTTLES DRAWN AEROBIC AND ANAEROBIC Blood Culture results may not be optimal due to an inadequate volume of blood received in culture bottles   Culture   Final    NO GROWTH 3 DAYS Performed at [facility], [address] [place], [place] [address]    Report Status PENDING  Incomplete  Culture, blood (routine x 2)     Status: None (Preliminary result)   Collection Time: 12/20/23  9:18 PM   Specimen: Left Antecubital; Blood  Result Value Ref Range Status   Specimen Description LEFT ANTECUBITAL  Final   Special Requests   Final    BOTTLES DRAWN AEROBIC AND ANAEROBIC Blood Culture adequate volume   Culture   Final    NO GROWTH 3 DAYS Performed at [facility], [address] [place], [place] [address]    Report Status PENDING  Incomplete  Aerobic/Anaerobic Culture w Gram Stain (surgical/deep wound)     Status: None (Preliminary result)   Collection Time: 12/21/23 11:00 PM   Specimen: Foot; Wound  Result Value Ref Range Status   Specimen Description   Final    FOOT Performed at [facility], [address], [place], [place] [address]    Special Requests   Final    RIGHT SIDE, IMMUNOCOMPROMISED Performed at [facility], [address] [place], [place] [address]    Gram Stain   Final    RARE WBC PRESENT, PREDOMINANTLY PMN RARE GRAM POSITIVE COCCI    Culture   Final    FEW STAPHYLOCOCCUS AUREUS SUSCEPTIBILITIES TO FOLLOW Performed at [facility], [address], [place], [place] [address]     Report Status PENDING  Incomplete     Radiology Studies: No results found.   Scheduled Meds:  calcitRIOL   0.25 mcg Oral Daily   carvedilol   6.25 mg Oral BID WC   cholecalciferol   1,000 Units Oral Daily   dapagliflozin  propanediol  10 mg Oral Daily   docusate sodium   100 mg Oral BID   doxazosin   4 mg Oral Daily   DULoxetine   60 mg Oral Daily   enoxaparin  (LOVENOX ) injection  60 mg Subcutaneous Q24H   finasteride   5 mg Oral QHS   insulin  aspart  0-5 Units Subcutaneous QHS   insulin  aspart  0-9 Units Subcutaneous TID WC   insulin  glargine  44 Units Subcutaneous Daily   levothyroxine   100 mcg Oral Q0600   multivitamin with minerals   Oral Daily   pantoprazole   40 mg Oral Q1200   pravastatin   20 mg Oral q1800   pregabalin   225 mg Oral BID   Continuous Infusions:  vancomycin  1,250 mg (12/22/23 2038)     LOS: 3 days  MDM: Patient is high risk for one or more organ failure.  They necessitate ongoing hospitalization for continued IV therapies and subsequent lab monitoring. Total time spent interpreting labs and vitals, reviewing the medical record, coordinating care amongst consultants and care team members, directly assessing and discussing care with the patient and/or family: 55 min  Devaughn KATHEE Ban, MD [facility]  To contact the attending physician between 7A-7P please use Epic Chat. To contact the covering physician during after hours 7P-7A, please review Amion.  12/23/2023, 3:21 PM

## 2023-12-23 NOTE — Care Management Important Message (Signed)
 Important Message  Patient Details  Name: CHAISE MAHABIR MRN: [id number] Date of Birth: [birth year]   Important Message Given:  Yes - Medicare IM     Kendarius Vigen W, CMA 12/23/2023, 12:28 PM

## 2023-12-24 ENCOUNTER — Inpatient Hospital Stay

## 2023-12-24 ENCOUNTER — Other Ambulatory Visit: Payer: Self-pay

## 2023-12-24 DIAGNOSIS — L03115 Cellulitis of right lower limb: Secondary | ICD-10-CM | POA: Diagnosis not present

## 2023-12-24 DIAGNOSIS — L02415 Cutaneous abscess of right lower limb: Secondary | ICD-10-CM | POA: Diagnosis not present

## 2023-12-24 LAB — CBC
HCT: 34.2 % — ABNORMAL LOW (ref 39.0–52.0)
Hemoglobin: 11 g/dL — ABNORMAL LOW (ref 13.0–17.0)
MCH: 28.2 pg (ref 26.0–34.0)
MCHC: 32.2 g/dL (ref 30.0–36.0)
MCV: 87.7 fL (ref 80.0–100.0)
Platelets: 245 K/uL (ref 150–400)
RBC: 3.9 MIL/uL — ABNORMAL LOW (ref 4.22–5.81)
RDW: 16.9 % — ABNORMAL HIGH (ref 11.5–15.5)
WBC: 6 K/uL (ref 4.0–10.5)
nRBC: 0 % (ref 0.0–0.2)

## 2023-12-24 LAB — GLUCOSE, CAPILLARY
Glucose-Capillary: 103 mg/dL — ABNORMAL HIGH (ref 70–99)
Glucose-Capillary: 113 mg/dL — ABNORMAL HIGH (ref 70–99)

## 2023-12-24 LAB — BASIC METABOLIC PANEL WITH GFR
Anion gap: 8 (ref 5–15)
BUN: 22 mg/dL (ref 8–23)
CO2: 27 mmol/L (ref 22–32)
Calcium: 8.5 mg/dL — ABNORMAL LOW (ref 8.9–10.3)
Chloride: 103 mmol/L (ref 98–111)
Creatinine, Ser: 1.66 mg/dL — ABNORMAL HIGH (ref 0.61–1.24)
GFR, Estimated: 42 mL/min — ABNORMAL LOW (ref >60)
Glucose, Bld: 119 mg/dL — ABNORMAL HIGH (ref 70–99)
Potassium: 3.8 mmol/L (ref 3.5–5.1)
Sodium: 138 mmol/L (ref 135–145)

## 2023-12-24 MED ORDER — GADOBUTROL 1 MMOL/ML IV SOLN
10.0000 mL | Freq: Once | INTRAVENOUS | Status: AC | PRN
  Administered 2023-12-24: 10 mL via INTRAVENOUS

## 2023-12-24 MED ORDER — DOXYCYCLINE HYCLATE 100 MG PO CAPS
100.0000 mg | ORAL_CAPSULE | Freq: Two times a day (BID) | ORAL | 0 refills | Status: DC
  Filled 2023-12-24: qty 14, 7d supply, fill #0

## 2023-12-24 MED ORDER — ASPIRIN 81 MG PO TBEC: 325.0000 mg | DELAYED_RELEASE_TABLET | Freq: Every day | ORAL | Status: AC

## 2023-12-24 MED ORDER — CEFADROXIL 500 MG PO CAPS
1000.0000 mg | ORAL_CAPSULE | Freq: Two times a day (BID) | ORAL | 0 refills | Status: AC
Start: 2023-12-24 — End: 2023-12-31
  Filled 2023-12-24: qty 28, 7d supply, fill #0

## 2023-12-24 NOTE — Progress Notes (Signed)
 PODIATRY: PROGRESS NOTE     O/N: NAEON  Subjective:  Patient resting comfortably at bedside. Reports that he has been doing better and slept well overnight.  Denies F/C/N/V/SOB/CP. Denies acute calf pain.    PHYSICAL EXAMINATION: BP 128/68 (BP Location: Left Arm)   Pulse 69   Temp 97.8 F (36.6 C)   Resp 16   Ht 5' 11 (1.803 m)   Wt 120.2 kg   SpO2 98%   BMI 36.96 kg/m ? GEN: NAD. AOX3. ? RESP: Non-labored breathing on RA.? ABD: NT/ND of all four quadrants.? NEURO: Moving all four extremities spontaneously. ? ? FOCUSED LOWER EXTREMITY EXAMINATION:    Neurological:  - Protective sensation diminished / absent - Gross protective sensation diminished bilaterally.  - No focal motor or sensory deficits identified bilaterally.    Vascular:  - Dorsalis Pedis artery on palpation: 2 - Posterior Tibial artery on palpation: 1 - Capillary Filling Times: sluggish - Peripheral edema: localized to RIGHT forefoot / medial aspect - Dependency Rubor: + - Varicosities: mild     Musculoskeletal:  Muscle strength: 5/5 in all 4 quadrants  Ankle Joint ROM: decreased, equinus noted Global foot - TTP: none   Dermatological:  - Skin quality: atrophic - Interdigital spaces: c/d/i    #WOUND 1    Type: Full thickness neuropathic ulceration Location: R 1st Ray with extension to first IDS (+ mild extension sub 2d digit) PTB: - Malodor:+  Active Drainage: yes; If yes, consistency: serous Post Debridement Wound Measurements: 6 cm (l) x 7 cm (w) x 0.5 cm (d)  Post Debridement Measurements: 7 cm (l) x 8 cm (w) x 1 cm (d)  Methods for Debridement: Sharp (#10 blade - sterile), Mechanical (tissue scissor- sterile) Cautery needed(?): No   Debridement conducted without incident by above methods to granular wound base tissue           ??  Results for orders placed or performed during the hospital encounter of 12/20/23  Culture, blood (routine x 2)     Status: None (Preliminary  result)   Collection Time: 12/20/23  8:48 PM   Specimen: BLOOD  Result Value Ref Range Status   Specimen Description BLOOD RIGHT ANTECUBITAL  Final   Special Requests   Final    BOTTLES DRAWN AEROBIC AND ANAEROBIC Blood Culture results may not be optimal due to an inadequate volume of blood received in culture bottles   Culture   Final    NO GROWTH 4 DAYS Performed at [facility], [address], [place], [place] [address]    Report Status PENDING  Incomplete  Culture, blood (routine x 2)     Status: None (Preliminary result)   Collection Time: 12/20/23  9:18 PM   Specimen: Left Antecubital; Blood  Result Value Ref Range Status   Specimen Description LEFT ANTECUBITAL  Final   Special Requests   Final    BOTTLES DRAWN AEROBIC AND ANAEROBIC Blood Culture adequate volume   Culture   Final    NO GROWTH 4 DAYS Performed at [facility], [address] [place], [place] [address]    Report Status PENDING  Incomplete  Aerobic/Anaerobic Culture w Gram Stain (surgical/deep wound)     Status: None (Preliminary result)   Collection Time: 12/21/23 11:00 PM   Specimen: Foot; Wound  Result Value Ref Range Status   Specimen Description   Final    FOOT Performed at [facility], [address] [place] [place], [place] [address]  Special Requests   Final    RIGHT SIDE, IMMUNOCOMPROMISED Performed at [facility], [address] [place], [place] [address]    Gram Stain   Final    RARE WBC PRESENT, PREDOMINANTLY PMN RARE GRAM POSITIVE COCCI Performed at [facility], [address] [place], [place] [address]    Culture FEW STAPHYLOCOCCUS AUREUS  Final   Report Status PENDING  Incomplete   Organism ID, Bacteria STAPHYLOCOCCUS AUREUS  Final      Susceptibility   Staphylococcus aureus - MIC*    CIPROFLOXACIN <=0.5 SENSITIVE Sensitive     ERYTHROMYCIN <=0.25 SENSITIVE Sensitive     GENTAMICIN  <=0.5 SENSITIVE Sensitive     OXACILLIN <=0.25  SENSITIVE Sensitive     TETRACYCLINE <=1 SENSITIVE Sensitive     VANCOMYCIN  1 SENSITIVE Sensitive     TRIMETH /SULFA  <=10 SENSITIVE Sensitive     CLINDAMYCIN <=0.25 SENSITIVE Sensitive     RIFAMPIN <=0.5 SENSITIVE Sensitive     Inducible Clindamycin NEGATIVE Sensitive     LINEZOLID 2 SENSITIVE Sensitive     * FEW STAPHYLOCOCCUS AUREUS      ASSESSMENT:?  Assessment   Assessment: Diabetic foot infection + cellulitis of the RIGHT foot Bedside I&D 12/23/23 with necrotic tissue removed as noted in PE.  No radiographic evidence of osteomyelitis No probe-to-bone on clinical exam Superficial fat necrosis noted in three areas of the wound, no tunneling noted of these points, though they are deep which is better evaluated via MRI (kindly ordered by hospitalist)     Plan   - ABX: Appreciate Medicine / ID / Pharmacy assist with antibiotic stewardship and appropriate coverage.  - f/u wound culture 12/21/23 - Activity: Heel WBAT to the RIGHT lower extremity in post op shoe (order placed)  - Wound Care:  Instructions:  Frequency: Daily dressing changes.  Materials: Betadine , gauze, ABD pad, Kerlix (gauze roll), 4inch ACE Paint area with betadine .  Cover with gauze 4x4 and overlying ABD pad Anchor dressings with kerlix and overlying loosely placed ACE bandage.    Dispo:  MRI without findings of deeper seated infection. To conduct local wound care and appropriate antibiotics for DC with PO on 10 days.   Currently no indication for operative debridement beyond bedside I&D given absence of deep infection and patient tolerance.

## 2023-12-24 NOTE — Plan of Care (Signed)
  Problem: Fluid Volume: Goal: Hemodynamic stability will improve Outcome: Progressing   Problem: Clinical Measurements: Goal: Diagnostic test results will improve Outcome: Progressing Goal: Signs and symptoms of infection will decrease Outcome: Progressing   Problem: Respiratory: Goal: Ability to maintain adequate ventilation will improve Outcome: Progressing   Problem: Education: Goal: Ability to describe self-care measures that may prevent or decrease complications (Diabetes Survival Skills Education) will improve Outcome: Progressing Goal: Individualized Educational Video(s) Outcome: Progressing   Problem: Coping: Goal: Ability to adjust to condition or change in health will improve Outcome: Progressing   Problem: Fluid Volume: Goal: Ability to maintain a balanced intake and output will improve Outcome: Progressing   Problem: Health Behavior/Discharge Planning: Goal: Ability to identify and utilize available resources and services will improve Outcome: Progressing Goal: Ability to manage health-related needs will improve Outcome: Progressing   Problem: Metabolic: Goal: Ability to maintain appropriate glucose levels will improve Outcome: Progressing   Problem: Nutritional: Goal: Maintenance of adequate nutrition will improve Outcome: Progressing Goal: Progress toward achieving an optimal weight will improve Outcome: Progressing   Problem: Skin Integrity: Goal: Risk for impaired skin integrity will decrease Outcome: Progressing   Problem: Tissue Perfusion: Goal: Adequacy of tissue perfusion will improve Outcome: Progressing   Problem: Education: Goal: Knowledge of General Education information will improve Description: Including pain rating scale, medication(s)/side effects and non-pharmacologic comfort measures Outcome: Progressing

## 2023-12-24 NOTE — Progress Notes (Signed)
   12/24/23 0900  Oxygen Therapy  SpO2 98 %  O2 Device Room Air  Mobility  Activity Ambulated independently;Stood at bedside  Level of Assistance Independent after set-up  Assistive Device None  Distance Ambulated (ft) 35 ft (had post op shoe on and worked on balance. It was shaky but ok.)  Range of Motion/Exercises Active Assistive  Activity Response Tolerated well  Mobility Referral Yes  Mobility visit 1 Mobility  Mobility Specialist Start Time (ACUTE ONLY) 0857  Mobility Specialist Stop Time (ACUTE ONLY) 0912  Mobility Specialist Time Calculation (min) (ACUTE ONLY) 15 min   Mobility Specialist - Progress Note  Pt was in the bed upon entry. Pt agreed to mobility. Post-op shoe was put on before activity. Pt ambulated to the bathroom and to the exit door and back. Pt ambulated without walker well. Pt shoed sign of good balance but still a bit off. Pt returned back to the bed in a chair position due to breakfast arriving. Pt insisted to keep boot on while eating.  Clem Rodes Mobility Specialist 12/24/23, 9:31 AM

## 2023-12-24 NOTE — Telephone Encounter (Signed)
 Form faxed to [phone] with a completed transmission log

## 2023-12-24 NOTE — Evaluation (Signed)
 Physical Therapy Evaluation Patient Details Name: Douglas [name] MRN: [id number] DOB: 26-Mar-1946 Today's Date: 12/24/2023  History of Present Illness  Douglas [name] is a 78 y.o. Caucasian male with medical history significant for type 2 diabetes mellitus, diastolic dysfunction, depression, GERD, gout, essential hypertension and hypothyroidism, who presented to the emergency room with acute onset of right foot pain and swelling which has been worsening over the last couple week. Debridement for his RLE on 12/23/2023.  Clinical Impression  Pt is a pleasant 78 year old male who was admitted for worsening R foot pain. He had debridement of his RLE on 12/23/2023. Prior to hospitalization, pt able to walk short distances without AD and performed ADLs independently, however reports 6 falls in the past 6 months. Pt performs transfers and ambulation with CGA. Pt demonstrates deficits with balance and activity tolerance. Pt received sitting at EOB with his post op shoe donned on his RLE. Pt able to STS from lowest bed height with CGA and ambulate 74ft using a RW. Pt limited by inc knee pain. Verbal and tactile cuing required for RW management. Pt educated about WB precautions and demonstrated understanding. Would benefit from skilled PT to address above deficits and promote optimal return to PLOF.       If plan is discharge home, recommend the following: A little help with walking and/or transfers;Help with stairs or ramp for entrance   Can travel by private vehicle        Equipment Recommendations Rolling walker (2 wheels)  Recommendations for Other Services       Functional Status Assessment Patient has had a recent decline in their functional status and demonstrates the ability to make significant improvements in function in a reasonable and predictable amount of time.     Precautions / Restrictions Precautions Precautions: Fall Recall of Precautions/Restrictions: Intact Required  Braces or Orthoses: Other Brace Other Brace: R post op shoe Restrictions Weight Bearing Restrictions Per Provider Order: Yes RLE Weight Bearing Per Provider Order: Weight bearing as tolerated (through heel only)      Mobility  Bed Mobility               General bed mobility comments: NT. Pt received sitting at EOB independently    Transfers Overall transfer level: Modified independent Equipment used: Rolling walker (2 wheels)               General transfer comment: Pt able to stand from lowest bed height with mod I. Use of RW upone standing.    Ambulation/Gait Ambulation/Gait assistance: Supervision Gait Distance (Feet): 60 Feet Assistive device: Rolling walker (2 wheels) Gait Pattern/deviations: WFL(Within Functional Limits) Gait velocity: decreased     General Gait Details: Pt able to adhere to WB precautions throughout ambulation. Pt limited by increase in knee pain requiring shorter ambulation.  Stairs            Wheelchair Mobility     Tilt Bed    Modified Rankin (Stroke Patients Only)       Balance Overall balance assessment: Mild deficits observed, not formally tested                                           Pertinent Vitals/Pain Pain Assessment Pain Assessment: No/denies pain    Home Living Family/patient expects to be discharged to:: Private residence Living Arrangements: Spouse/significant other Available Help at Discharge:  Family Type of Home: House Home Access: Stairs to enter Entrance Stairs-Rails: None Entrance Stairs-Number of Steps: 2   Home Layout: One level Home Equipment: Agricultural consultant (2 wheels);Cane - single point;Grab bars - tub/shower Additional Comments: RW belonged to his mother. Pt does not use SPCs    Prior Function Prior Level of Function : Independent/Modified Independent             Mobility Comments: Pt reports he did not use AD for ambulation however has had at least 6 falls in  the past 6 months ADLs Comments: independent     Extremity/Trunk Assessment   Upper Extremity Assessment Upper Extremity Assessment: Overall WFL for tasks assessed    Lower Extremity Assessment Lower Extremity Assessment: Overall WFL for tasks assessed    Cervical / Trunk Assessment Cervical / Trunk Assessment: Normal  Communication   Communication Communication: No apparent difficulties    Cognition Arousal: Alert Behavior During Therapy: WFL for tasks assessed/performed   PT - Cognitive impairments: No apparent impairments                       PT - Cognition Comments: Pt very pleasant and agreeable to PT session Following commands: Intact       Cueing Cueing Techniques: Verbal cues, Tactile cues     General Comments      Exercises Other Exercises Other Exercises: edu about weight bearing precautions   Assessment/Plan    PT Assessment Patient needs continued PT services  PT Problem List         PT Treatment Interventions Gait training;Stair training;Functional mobility training;Therapeutic activities;Therapeutic exercise;Balance training;Neuromuscular re-education;Patient/family education    PT Goals (Current goals can be found in the Care Plan section)  Acute Rehab PT Goals Patient Stated Goal: to go home PT Goal Formulation: With patient Time For Goal Achievement: 01/07/24 Potential to Achieve Goals: Good    Frequency Min 1X/week     Co-evaluation               AM-PAC PT 6 Clicks Mobility  Outcome Measure Help needed turning from your back to your side while in a flat bed without using bedrails?: None Help needed moving from lying on your back to sitting on the side of a flat bed without using bedrails?: None Help needed moving to and from a bed to a chair (including a wheelchair)?: A Little Help needed standing up from a chair using your arms (e.g., wheelchair or bedside chair)?: None Help needed to walk in hospital room?:  None Help needed climbing 3-5 steps with a railing? : A Little 6 Click Score: 22    End of Session Equipment Utilized During Treatment: Gait belt Activity Tolerance: Patient tolerated treatment well Patient left: in chair;with call bell/phone within reach Nurse Communication: Mobility status PT Visit Diagnosis: History of falling (Z91.81);Other abnormalities of gait and mobility (R26.89)    Time: 9081-9061 PT Time Calculation (min) (ACUTE ONLY): 20 min   Charges:                 Elian Gloster, SPT   Rodina Pinales 12/24/2023, 12:44 PM

## 2023-12-24 NOTE — Discharge Summary (Addendum)
 Douglas [name] FMW:[id number] DOB: [birth year] DOA: 12/20/2023  PCP: Gretel App, NP  Admit date: 12/20/2023 Discharge date: 12/24/2023  Time spent: 35 minutes  Recommendations for Outpatient Follow-up:  Pcp f/u Podiatry f/u 1 week  Cardiology f/u after completion of ambulatory heart monitor    Discharge Diagnoses:  Principal Problem:   Cellulitis and abscess of right lower extremity Active Problems:   Uncontrolled type 2 diabetes mellitus with hyperglycemia, with long-term current use of insulin  (HCC)   Acute kidney injury superimposed on chronic kidney disease (HCC)   Dyslipidemia   Hypothyroidism   Diabetic neuropathy (HCC)   Discharge Condition: improved  Diet recommendation: heart healthy  Filed Weights   12/20/23 2039  Weight: 120.2 kg    History of present illness:  From admission h and p Douglas [name] is a 78 y.o. Caucasian male with medical history significant for type 2 diabetes mellitus, diastolic dysfunction, depression, GERD, gout, essential hypertension and hypothyroidism, who presented to the emergency room with acute onset of right foot pain and swelling which has been worsening over the last couple weeks and developed redness since last night with tenderness and pain with mild oozing.  He had a temperature of 100.6 at home with chills.  No recent falls or trauma.  No nausea or vomiting or abdominal pain.  No chest pain or palpitations.  No cough or wheezing or dyspnea.  No dysuria, oliguria or hematuria or flank pain.   Hospital Course:   Patient presents with foot infection around right first toe. Diabetic foot infection. Evaluated by podiatry, debridement and I and d performed. Culture grew mssa, pan-sensitive. Treated with IV vancomycin  here. MRI no osteo or ongoing abscess. Symptomatically improved, leukocytosis resolved. Cleared for discharge by podiatry, plan to continue wound care per their instructions, f/u 1 week. Will d/c home with 1 week  duricef. Also advise pcp f/u. Prior hospitalist noted possible a-fib via telemetry but confirmatory EKG not performed and no a-fib subsequently seen on monitor. Cardiology will arrange to mail the patient a 30-day event monitor and he will f/u with his cardiologist upon that completion. Other medical problems stable. Given the lower extremity infection advise holding jardiance  until f/u with prescribing provider. D/c plan reviewed with wife who is in agreement.   Procedures: I and D debridement of right foot   Consultations: podiatry  Discharge Exam: Vitals:   12/24/23 0734 12/24/23 0900  BP: 128/68   Pulse: 69   Resp: 16   Temp: 97.8 F (36.6 C)   SpO2: 98% 98%    General: NAD Cardiovascular: RRR Respiratory: CTAB From today 9/11:      Discharge Instructions   Discharge Instructions     Diet - low sodium heart healthy   Complete by: As directed    Discharge wound care:   Complete by: As directed    Per podiatry's instructions   Increase activity slowly   Complete by: As directed       Allergies as of 12/24/2023       Reactions   Bee Venom Swelling   Benadryl [diphenhydramine Hcl (sleep)] Other (See Comments)   Hyperactivity   Enalapril Itching   itching   Gabapentin Palpitations, Rash   rash   Hctz [hydrochlorothiazide] Other (See Comments)   Decreased potassium   Omeprazole Other (See Comments)   Other reaction(s): increased sx on high dose        Medication List     PAUSE taking these medications    dapagliflozin  propanediol  10 MG Tabs tablet Wait to take this until your doctor or other care provider tells you to start again. Commonly known as: [name]  Take 1 tablet (10 mg total) by mouth daily.       STOP taking these medications    naproxen sodium 220 MG tablet Commonly known as: ALEVE       TAKE these medications    acetaminophen  500 MG tablet Commonly known as: TYLENOL  Take 500 mg by mouth every 6 (six) hours as needed.    aspirin  EC 81 MG tablet Take 4 tablets (325 mg total) by mouth daily. What changed: medication strength   BL VITAMIN B COMPLEX PO Take 1 tablet by mouth daily.   calcitRIOL  0.25 MCG capsule Commonly known as: ROCALTROL  Take 1 capsule (0.25 mcg total) by mouth daily.   carvedilol  6.25 MG tablet Commonly known as: COREG  TAKE 1 TABLET(6.25 MG) BY MOUTH TWICE DAILY WITH A MEAL   cefadroxil  500 MG capsule Commonly known as: DURICEF Take 2 capsules (1,000 mg total) by mouth 2 (two) times daily for 7 days.   clove oil liquid Apply 1 application topically as needed.   colchicine  0.6 MG tablet Take 2 tablets by mouth now x 1 dose then take 1 tablet by mouth one hour later   cyanocobalamin  1000 MCG/ML injection Commonly known as: VITAMIN B12 Inject 1,000 mcg into the muscle every 30 (thirty) days.   cyclobenzaprine  10 MG tablet Commonly known as: FLEXERIL  Take 1 tablet (10 mg total) by mouth 3 (three) times daily as needed.   doxazosin  4 MG tablet Commonly known as: CARDURA  Take 1 tablet (4 mg total) by mouth daily.   DULoxetine  60 MG capsule Commonly known as: CYMBALTA  TAKE 1 CAPSULE(60 MG) BY MOUTH DAILY   esomeprazole  40 MG capsule Commonly known as: NexIUM  Take 1 capsule (40 mg total) by mouth daily at 12 noon.   finasteride  5 MG tablet Commonly known as: PROSCAR  TAKE 1 TABLET(5 MG) BY MOUTH AT BEDTIME   furosemide  40 MG tablet Commonly known as: LASIX  TAKE 1 TABLET(40 MG) BY MOUTH DAILY   hydroxypropyl methylcellulose / hypromellose 2.5 % ophthalmic solution Commonly known as: ISOPTO TEARS / GONIOVISC 1 drop as needed for dry eyes.   HYPODERMIC NEEDLE 18GX1 18G X 1 Misc Use every 14 days to draw up testosterone    HYPODERMIC NEEDLE 25GX1 25G X 1 Misc Use every 14 days to administer testosterone    levothyroxine  100 MCG tablet Commonly known as: SYNTHROID  TAKE 1 TABLET(100 MCG) BY MOUTH DAILY BEFORE BREAKFAST   loratadine  10 MG tablet Commonly known  as: CLARITIN  Take 10 mg by mouth daily as needed for allergies.   losartan  25 MG tablet Commonly known as: COZAAR  TAKE 1 TABLET BY MOUTH EVERY MORNING THEN TAKE 2 TABLETS BY MOUTH EVERY EVENING   Magnesium  250 MG Tabs Take 250 mg by mouth 2 (two) times daily.   mometasone  50 MCG/ACT nasal spray Commonly known as: Nasonex  Place 2 sprays into the nose daily. What changed:  when to take this reasons to take this   MULTIVITAMIN MEN PO Take 1 tablet by mouth daily.   Novofine Pen Needle 32G X 6 MM Misc Generic drug: Insulin  Pen Needle 1 each by Does not apply route 2 (two) times daily.   Ozempic  (1 MG/DOSE) 4 MG/3ML Sopn Generic drug: Semaglutide  (1 MG/DOSE) Inject 1 mg into the skin once a week. What changed: how much to take   pravastatin  20 MG tablet Commonly known as: PRAVACHOL  Take  1 tablet (20 mg total) by mouth daily.   pregabalin  225 MG capsule Commonly known as: Lyrica  Take 1 capsule (225 mg total) by mouth 2 (two) times daily.   tadalafil 20 MG tablet Commonly known as: CIALIS Take 20 mg by mouth daily as needed.   testosterone  cypionate 200 MG/ML injection Commonly known as: DEPOTESTOSTERONE CYPIONATE Inject 200 mg into the muscle every 14 (fourteen) days.   [name]  FlexTouch 200 UNIT/ML FlexTouch Pen Generic drug: insulin  degludec Inject 40 Units into the skin daily. What changed: how much to take   Vitamin D3 50 MCG (2000 UT) Tabs Take 1 tablet by mouth daily.               Discharge Care Instructions  (From admission, onward)           Start     Ordered   12/24/23 0000  Discharge wound care:       Comments: Per podiatry's instructions   12/24/23 1313           Allergies  Allergen Reactions   Bee Venom Swelling   Benadryl [Diphenhydramine Hcl (Sleep)] Other (See Comments)    Hyperactivity    Enalapril Itching    itching   Gabapentin Palpitations and Rash    rash   Hctz [Hydrochlorothiazide] Other (See Comments)     Decreased potassium   Omeprazole Other (See Comments)    Other reaction(s): increased sx on high dose    Follow-up Information     Gretel App, NP Follow up.   Specialty: Nurse Practitioner Why: hospital follow up Contact information: [address] [place] [address] [place] [address] [phone]         Tanda Greig MATSU, DPM Follow up.   Specialty: Podiatry Why: in one week Contact information: [address] [place] [address] [phone]                  The results of significant diagnostics from this hospitalization (including imaging, microbiology, ancillary and laboratory) are listed below for reference.    Significant Diagnostic Studies: MR FOOT RIGHT W WO CONTRAST Result Date: 12/24/2023 CLINICAL DATA:  Diabetes, right foot infection EXAM: MRI OF THE RIGHT FOREFOOT WITHOUT AND WITH CONTRAST TECHNIQUE: Multiplanar, multisequence MR imaging of the right foot from the midfoot through the toes was performed before and after the administration of intravenous contrast. CONTRAST:  10mL GADAVIST  GADOBUTROL  1 MMOL/ML IV SOLN COMPARISON:  Radiographs 12/20/2023 FINDINGS: Despite efforts by the technologist and patient, motion artifact is present on today's exam and could not be eliminated. This reduces exam sensitivity and specificity. Bones/Joint/Cartilage Bifid medial sesamoid. No significant abnormal marrow edema or enhancement in the forefoot to indicate osteomyelitis. Dorsal spurring at the talonavicular articulation. Ligaments Lisfranc ligament intact. Muscles and Tendons Notable muscular atrophy. Mild flexor hallucis longus tenosynovitis. Trace edema remaining musculature, likely neurogenic. Soft tissues Notable dorsal subcutaneous edema the forefoot. Cutaneous irregularity potentially with blistering or ulceration along the lateral dorsal margin of the base of the great toe as on image 19 series 9. Suspected ulceration along the medial to the first MTP joint great  toe proximal phalanx. No drainable abscess identified. IMPRESSION: 1. No findings of osteomyelitis. 2. Dorsal subcutaneous edema in the forefoot, cellulitis is not excluded. 3. Cutaneous irregularity potentially with blistering or ulceration along the lateral dorsal margin of the base of the great toe. Suspected ulceration along the medial to the first MTP joint great toe proximal phalanx. No drainable abscess identified. 4. Mild  flexor hallucis longus tenosynovitis. 5. Notable muscular atrophy. Trace edema remaining musculature, likely neurogenic. Electronically Signed   By: Ryan Salvage M.D.   On: 12/24/2023 08:39   DG Foot Complete Right Result Date: 12/20/2023 EXAM: 3 or more VIEW(S) XRAY OF THE RIGHT FOOT 12/20/2023 08:57:41 PM COMPARISON: 07/21/23. CLINICAL HISTORY: Right foot pain, patient states he has neuropathy and had two toenails pulled. FINDINGS: BONES AND JOINTS: No acute fracture. No focal osseous lesion. No joint dislocation. Plantar and posterior calcaneal spurs. Midfoot osteoarthritis. Hallux valgus. First toe MTP joint osteoarthritis. SOFT TISSUES: Marked dorsal soft tissue swelling. IMPRESSION: 1. Marked dorsal soft tissue swelling. 2. Midfoot osteoarthritis. Electronically signed by: Norman Gatlin MD 12/20/2023 09:03 PM EDT RP Workstation: [id number]   US  LIMITED JOINT SPACE STRUCTURES UP LEFT(NO LINKED CHARGES) Result Date: 12/04/2023 Procedure: Real-time Ultrasound Guided Injection of left acromioclavicular joint Device: GE Logiq Q7 Ultrasound guided injection is preferred based studies that show increased duration, increased effect, greater accuracy, decreased procedural pain, increased response rate, and decreased cost with ultrasound guided versus blind injection. Verbal informed consent obtained. Time-out conducted. Noted no overlying erythema, induration, or other signs of local infection. Skin prepped in a sterile fashion. Local anesthesia: Topical Ethyl chloride. With sterile  technique and under real time ultrasound guidance: With a 25-gauge half inch needle injected with 0.5 cc of 0.5% Marcaine  and 0.5 cc of Kenalog 40 mg/mL Completed without difficulty Pain immediately resolved suggesting accurate placement of the medication. Advised to call if fevers/chills, erythema, induration, drainage, or persistent bleeding. Impression: Technically successful ultrasound guided injection.    Microbiology: Recent Results (from the past 240 hours)  Culture, blood (routine x 2)     Status: None (Preliminary result)   Collection Time: 12/20/23  8:48 PM   Specimen: BLOOD  Result Value Ref Range Status   Specimen Description BLOOD RIGHT ANTECUBITAL  Final   Special Requests   Final    BOTTLES DRAWN AEROBIC AND ANAEROBIC Blood Culture results may not be optimal due to an inadequate volume of blood received in culture bottles   Culture   Final    NO GROWTH 4 DAYS Performed at [facility], [address] [place], [place] [address]    Report Status PENDING  Incomplete  Culture, blood (routine x 2)     Status: None (Preliminary result)   Collection Time: 12/20/23  9:18 PM   Specimen: Left Antecubital; Blood  Result Value Ref Range Status   Specimen Description LEFT ANTECUBITAL  Final   Special Requests   Final    BOTTLES DRAWN AEROBIC AND ANAEROBIC Blood Culture adequate volume   Culture   Final    NO GROWTH 4 DAYS Performed at [facility], [address] [place], [place] [address]    Report Status PENDING  Incomplete  Aerobic/Anaerobic Culture w Gram Stain (surgical/deep wound)     Status: None (Preliminary result)   Collection Time: 12/21/23 11:00 PM   Specimen: Foot; Wound  Result Value Ref Range Status   Specimen Description   Final    FOOT Performed at [facility], [address] [place], [place] [address]    Special Requests   Final    RIGHT SIDE, IMMUNOCOMPROMISED Performed at [facility], [address]  [place], [place] [address]    Gram Stain   Final    RARE WBC PRESENT, PREDOMINANTLY PMN RARE GRAM POSITIVE COCCI Performed at [facility], [address], [place], [place] [address]  Culture FEW STAPHYLOCOCCUS AUREUS  Final   Report Status PENDING  Incomplete   Organism ID, Bacteria STAPHYLOCOCCUS AUREUS  Final      Susceptibility   Staphylococcus aureus - MIC*    CIPROFLOXACIN <=0.5 SENSITIVE Sensitive     ERYTHROMYCIN <=0.25 SENSITIVE Sensitive     GENTAMICIN  <=0.5 SENSITIVE Sensitive     OXACILLIN <=0.25 SENSITIVE Sensitive     TETRACYCLINE <=1 SENSITIVE Sensitive     VANCOMYCIN  1 SENSITIVE Sensitive     TRIMETH /SULFA  <=10 SENSITIVE Sensitive     CLINDAMYCIN <=0.25 SENSITIVE Sensitive     RIFAMPIN <=0.5 SENSITIVE Sensitive     Inducible Clindamycin NEGATIVE Sensitive     LINEZOLID 2 SENSITIVE Sensitive     * FEW STAPHYLOCOCCUS AUREUS     Labs: Basic Metabolic Panel: Recent Labs  Lab 12/20/23 2048 12/21/23 0544 12/22/23 0343 12/23/23 0335 12/24/23 0506  NA 133* 134* 133* 133* 138  K 4.0 4.0 3.7 3.4* 3.8  CL 98 102 100 102 103  CO2 24 22 22 23 27   GLUCOSE 228* 158* 123* 158* 119*  BUN 24* 23 23 26* 22  CREATININE 1.84* 1.59* 1.69* 1.61* 1.66*  CALCIUM 8.5* 8.5* 8.4* 8.4* 8.5*  MG  --   --   --  2.3  --    Liver Function Tests: Recent Labs  Lab 12/20/23 2048  AST 22  ALT 14  ALKPHOS 74  BILITOT 3.1*  PROT 6.8  ALBUMIN 3.4*   No results for input(s): LIPASE, AMYLASE in the last 168 hours. No results for input(s): AMMONIA in the last 168 hours. CBC: Recent Labs  Lab 12/20/23 2048 12/21/23 0544 12/22/23 0343 12/23/23 0335 12/24/23 0506  WBC 18.7* 19.8* 15.4* 10.8* 6.0  NEUTROABS 15.6*  --   --   --   --   HGB 12.5* 12.0* 11.0* 10.4* 11.0*  HCT 38.6* 37.1* 33.5* 31.9* 34.2*  MCV 88.7 87.9 88.2 86.0 87.7  PLT 145* 162 152 190 245   Cardiac Enzymes: No results for input(s): CKTOTAL, CKMB, CKMBINDEX, TROPONINI in the last 168  hours. BNP: BNP (last 3 results) No results for input(s): BNP in the last 8760 hours.  ProBNP (last 3 results) No results for input(s): PROBNP in the last 8760 hours.  CBG: Recent Labs  Lab 12/23/23 1110 12/23/23 1621 12/23/23 2035 12/24/23 0730 12/24/23 1249  GLUCAP 163* 124* 120* 103* 113*       Signed:  Devaughn KATHEE Ban MD.  [facility] 12/24/2023, 1:25 PM

## 2023-12-24 NOTE — TOC Transition Note (Signed)
 Transition of Care [facility]) - Discharge Note   Patient Details  Name: ESTON HESLIN MRN: [id number] Date of Birth: May 04, 1945  [facility] CM/SW Contact:  [place] L Grainger Mccarley, LCSW Phone Number: 12/24/2023, 2:32 PM   Clinical Narrative:     Discharge Summary is in. Home Health orders placed. Attending was asked to place order for RW based on PT recommendation. CSW contacted patient and asked for choice.   No choice for HHA or equipment provider.   CSW contacted [facility]. HHA services in place for PT. [facility] will process supply order and deliver to the home.   TOC signing off.         Patient Goals and CMS Choice            Discharge Placement                       Discharge Plan and Services Additional resources added to the After Visit Summary for                                       Social Drivers of Health (SDOH) Interventions SDOH Screenings   Food Insecurity: No Food Insecurity (12/21/2023)  Housing: Low Risk  (12/21/2023)  Transportation Needs: No Transportation Needs (12/21/2023)  Utilities: Not At Risk (12/21/2023)  Alcohol  Screen: Low Risk  (11/09/2023)  Depression (PHQ2-9): High Risk (11/09/2023)  Financial Resource Strain: Low Risk  (11/09/2023)  Physical Activity: Inactive (11/09/2023)  Social Connections: Moderately Integrated (12/21/2023)  Stress: No Stress Concern Present (11/09/2023)  Tobacco Use: Low Risk  (12/20/2023)  Health Literacy: Adequate Health Literacy (11/09/2023)     Readmission Risk Interventions     No data to display

## 2023-12-24 NOTE — Progress Notes (Signed)
   Request placed by primary team for outpatient event monitor to eval for possible arrhythmia.  Order placed for zio AT.  Device will be mailed to patient for self-activation.  He has in-office f/u on 10/14 @ 2:45 PM.  Lonni Meager, NP 12/24/2023, 1:30 PM

## 2023-12-25 ENCOUNTER — Other Ambulatory Visit: Payer: Self-pay | Admitting: Obstetrics and Gynecology

## 2023-12-25 ENCOUNTER — Telehealth: Payer: Self-pay

## 2023-12-25 DIAGNOSIS — L089 Local infection of the skin and subcutaneous tissue, unspecified: Secondary | ICD-10-CM

## 2023-12-25 DIAGNOSIS — L03115 Cellulitis of right lower limb: Secondary | ICD-10-CM

## 2023-12-25 LAB — CULTURE, BLOOD (ROUTINE X 2)
Culture: NO GROWTH
Culture: NO GROWTH
Special Requests: ADEQUATE

## 2023-12-27 LAB — AEROBIC/ANAEROBIC CULTURE W GRAM STAIN (SURGICAL/DEEP WOUND)

## 2023-12-28 NOTE — Transitions of Care (Post Inpatient/ED Visit) (Signed)
 12/28/2023 Late entry 12/25/23 Name: Douglas [name] MRN: [id number] DOB: 12-Nov-1945  Today's TOC FU Call Status: Today's TOC FU Call Status:: Successful TOC FU Call Completed TOC FU Call Complete Date: 12/25/23 Patient's Name and Date of Birth confirmed.  Transition Care Management Follow-up Telephone Call Date of Discharge: 12/24/23 Discharge Facility: [facility]) Type of Discharge: Inpatient Admission Primary Inpatient Discharge Diagnosis:: Cellulitis and abscess of right LE How have you been since you were released from the hospital?: Better Any questions or concerns?: No  Items Reviewed: Did you receive and understand the discharge instructions provided?: Yes Medications obtained,verified, and reconciled?: Yes (Medications Reviewed) Any new allergies since your discharge?: No Dietary orders reviewed?: Yes Type of Diet Ordered:: low salt heart healthy diet Do you have support at home?: Yes People in Home [RPT]: spouse Name of Support/Comfort Primary Source: [place]  [place]  Medications Reviewed Today: Medications Reviewed Today     Reviewed by Zayin Valadez E, RN (Registered Nurse) on 12/25/23 at [phone]  Med List Status: <None>   Medication Order Taking? Sig Documenting Provider Last Dose Status Informant  acetaminophen  (TYLENOL ) 500 MG tablet [id number] Yes Take 500 mg by mouth every 6 (six) hours as needed. [provider]  Active Self  aspirin  EC 81 MG tablet [id number]  Take 4 tablets (325 mg total) by mouth daily.  Patient not taking: Reported on 12/25/2023   Kandis Devaughn Sayres, MD  Active   B Complex Vitamins (BL VITAMIN B COMPLEX PO) [id number] Yes Take 1 tablet by mouth daily. [provider]  Active Self  calcitRIOL  (ROCALTROL ) 0.25 MCG capsule [id number]  Take 1 capsule (0.25 mcg total) by mouth daily.  Patient not taking: Reported on 12/25/2023   Hope Merle, MD  Active Self  carvedilol  (COREG ) 6.25 MG tablet  [id number] Yes TAKE 1 TABLET(6.25 MG) BY MOUTH TWICE DAILY WITH A MEAL Gretel App, NP  Active Self  cefadroxil  (DURICEF) 500 MG capsule [id number] Yes Take 2 capsules (1,000 mg total) by mouth 2 (two) times daily for 7 days. Wouk, Devaughn Sayres, MD  Active   Cholecalciferol  (VITAMIN D3) 50 MCG (2000 UT) TABS [id number] Yes Take 1 tablet by mouth daily. [provider]  Active Self  clove oil liquid [id number] Yes Apply 1 application topically as needed. [provider]  Active Self           Med Note (BEJOS, ANJA J   Sun Dec 20, 2023 11:59 PM) PRN  colchicine  0.6 MG tablet [id number] Yes Take 2 tablets by mouth now x 1 dose then take 1 tablet by mouth one hour later Lester, Kacy, NP  Active Self           Med Note (BEJOS, ANJA J   Sun Dec 20, 2023 11:59 PM) PRN  cyanocobalamin  (VITAMIN B12) 1000 MCG/ML injection [id number] Yes Inject 1,000 mcg into the muscle every 30 (thirty) days. [provider]  Active Self  cyclobenzaprine  (FLEXERIL ) 10 MG tablet [id number]  Take 1 tablet (10 mg total) by mouth 3 (three) times daily as needed.  Patient not taking: Reported on 12/25/2023   Gretel App, NP  Active Self           Med Note (BEJOS, ANJA J   Sun Dec 20, 2023 11:59 PM) PRN  dapagliflozin  propanediol ([name] ) 10 MG TABS tablet [id number]  Take 1 tablet (10 mg total) by mouth daily. Gretel App, NP  Active Self  doxazosin  (CARDURA ) 4 MG tablet [id number] Yes Take  1 tablet (4 mg total) by mouth daily. Gretel App, NP  Active Self  DULoxetine  (CYMBALTA ) 60 MG capsule [id number] Yes TAKE 1 CAPSULE(60 MG) BY MOUTH DAILY Maribeth Camellia MATSU, MD  Active Self  esomeprazole  (NEXIUM ) 40 MG capsule [id number] Yes Take 1 capsule (40 mg total) by mouth daily at 12 noon. Vannie Delon LABOR, MD  Active Self  finasteride  (PROSCAR ) 5 MG tablet [id number]  TAKE 1 TABLET(5 MG) BY MOUTH AT BEDTIME  Patient not taking: Reported on 12/25/2023   Twylla Glendia BROCKS, MD  Active Self  furosemide  (LASIX ) 40 MG  tablet [id number] Yes TAKE 1 TABLET(40 MG) BY MOUTH DAILY Gretel App, NP  Active Self  hydroxypropyl methylcellulose / hypromellose (ISOPTO TEARS / GONIOVISC) 2.5 % ophthalmic solution [id number] Yes 1 drop as needed for dry eyes. [provider]  Active Self           Med Note (BEJOS, ANJA J   Mon Dec 21, 2023 12:00 AM) PRN  insulin  degludec ([name]  FLEXTOUCH) 200 UNIT/ML FlexTouch Pen [id number]  Inject 40 Units into the skin daily.  Patient not taking: Reported on 12/25/2023   Maribeth Camellia MATSU, MD  Active Self           Med Note (BEJOS, ANJA J   Mon Dec 21, 2023 12:00 AM)    Insulin  Pen Needle (NOVOFINE PEN NEEDLE) 32G X 6 MM MISC [id number] Yes 1 each by Does not apply route 2 (two) times daily. Maribeth Camellia MATSU, MD  Active Self  levothyroxine  (SYNTHROID ) 100 MCG tablet [id number] Yes TAKE 1 TABLET(100 MCG) BY MOUTH DAILY BEFORE OFILIA Gretel App, NP  Active Self  loratadine  (CLARITIN ) 10 MG tablet [id number] Yes Take 10 mg by mouth daily as needed for allergies. [provider]  Active Self           Med Note (BEJOS, ANJA J   Mon Dec 21, 2023 12:00 AM) PRN  losartan  (COZAAR ) 25 MG tablet [id number] Yes TAKE 1 TABLET BY MOUTH EVERY MORNING THEN TAKE 2 TABLETS BY MOUTH EVERY KARNA Gretel App, NP  Active Self  Magnesium  250 MG TABS [id number] Yes Take 250 mg by mouth 2 (two) times daily. [provider]  Active Self  mometasone  (NASONEX ) 50 MCG/ACT nasal spray [id number] Yes Place 2 sprays into the nose daily. Maribeth Camellia MATSU, MD  Active Self  Multiple Vitamins-Minerals (MULTIVITAMIN MEN PO) [id number] Yes Take 1 tablet by mouth daily. [provider]  Active Self  Needle, Disp, (HYPODERMIC NEEDLE 18GX1) 18G X 1 MISC [id number] Yes Use every 14 days to draw up testosterone  [provider]  Active Self  Needle, Disp, (HYPODERMIC NEEDLE 25GX1) 25G X 1 MISC [id number] Yes Use every 14 days to administer testosterone  [provider]   Active Self  pravastatin  (PRAVACHOL ) 20 MG tablet [id number] Yes Take 1 tablet (20 mg total) by mouth daily. Gretel App, NP  Active Self           Med Note (BEJOS, ANJA J   Mon Dec 21, 2023 12:12 AM) Per office visit notes, ot was switched from simvastatin  to pravastatin  due to muscle cramps  pregabalin  (LYRICA ) 225 MG capsule [id number] Yes Take 1 capsule (225 mg total) by mouth 2 (two) times daily. Gretel App, NP  Active Self  Semaglutide , 1 MG/DOSE, (OZEMPIC , 1 MG/DOSE,) 4 MG/3ML SOPN [id number] Yes Inject 1 mg into the skin once a week.  Patient taking differently: Inject 2 mg into the skin once a week.  Maribeth Camellia MATSU, MD  Active Self           Med Note (BEJOS, ANJA J   Sun Dec 20, 2023 11:53 PM) Monday  tadalafil (CIALIS) 20 MG tablet [id number] Yes Take 20 mg by mouth daily as needed. [provider]  Active Self  testosterone  cypionate (DEPOTESTOSTERONE CYPIONATE) 200 MG/ML injection [id number] Yes Inject 200 mg into the muscle every 14 (fourteen) days. [provider]  Active Self            Home Care and Equipment/Supplies: Were Home Health Services Ordered?: Yes Name of Home Health Agency:: [facility] Has Agency set up a time to come to your home?: No EMR reviewed for Home Health Orders: Orders present/patient has not received call (refer to CM for follow-up) (provided patient with name and contact phone number for referred home health agency. Advised to call on 12/26/23 if he does not hear from someone from the home health agency) Any new equipment or medical supplies ordered?: Yes (patient states he was ordered a walker to be delivered today.) Name of Medical supply agency?: patient states he thinks its [employer] Were you able to get the equipment/medical supplies?: No (patient states walker to be delivered today.) Do you have any questions related to the use of the equipment/supplies?: No  Functional Questionnaire: Do you need assistance with  bathing/showering or dressing?: No Do you need assistance with meal preparation?: No Do you need assistance with eating?: No Do you have difficulty maintaining continence: No Do you need assistance with getting out of bed/getting out of a chair/moving?: No Do you have difficulty managing or taking your medications?: No  Follow up appointments reviewed: PCP Follow-up appointment confirmed?: Yes Date of PCP follow-up appointment?: 12/31/23 Follow-up Provider: Leron Glance, NP Specialist Hospital Follow-up appointment confirmed?: Yes Date of Specialist follow-up appointment?:  (patient unsure of the date however states he knows the appointment is next week. Advised patient to call provider office to confirm date.) Follow-Up Specialty Provider:: Dr. Greig Blush Do you need transportation to your follow-up appointment?: No Do you understand care options if your condition(s) worsen?: Yes-patient verbalized understanding  SDOH Interventions Today    Flowsheet Row Most Recent Value  SDOH Interventions   Food Insecurity Interventions Intervention Not Indicated  Housing Interventions Intervention Not Indicated  Transportation Interventions Intervention Not Indicated  Utilities Interventions Intervention Not Indicated   Discussed and offered 30 day TOC program.  Patient verbally agreed.  The patient has been provided with contact information for the care management team and has been advised to call with any health -related questions or concerns.  The patient verbalized understanding with current plan of care.  The patient is directed to their insurance card regarding availability of benefits coverage.    Arvin Seip RN, BSN, CCM [employer], Population Health Case Manager Phone: [phone]

## 2023-12-28 NOTE — Telephone Encounter (Signed)
 [facility] faxed back form needing corrections for refill form has been placed in provider to be signed folder.

## 2023-12-28 NOTE — Addendum Note (Signed)
 Addended by: LANDY HESS E on: 12/28/2023 04:17 PM   Modules accepted: Orders

## 2023-12-28 NOTE — Patient Instructions (Signed)
 Visit Information  Thank you for taking time to visit with me today. Please don't hesitate to contact me if I can be of assistance to you before our next scheduled telephone appointment.  Our next appointment is by telephone on 12/29/23 at 4 pm  Following is a copy of your care plan:   Goals Addressed             This Visit's Progress    VBCI Transitions of Care (TOC) Care Plan       Problems:  Recent Hospitalization for treatment of cellulitis and abscess of right LE Knowledge Deficit Related to management of cellulitis/ abscess of right LE  Goal:  Over the next 30 days, the patient will not experience hospital readmission  Interventions:  Transitions of Care: Doctor Visits  - discussed the importance of doctor visits Reviewed Signs and symptoms of infection Patient provided name and contact phone number for home health agency and DME provider.  Assessed for Zio patch delivery Assess for symptoms/ healing of Right LE wound Reviewed upcoming provider visits Referral to social worker for reported depression symptoms PHQ2 AND PHQ9 completed SDOH assessment completed Discussed and offered 30 day TOC program Advised to take antibiotics until completed. Advised to call provider for any new symptoms or concerns.    Patient Self Care Activities:  Attend all scheduled provider appointments Call pharmacy for medication refills 3-7 days in advance of running out of medications Call provider office for new concerns or questions  Take medications as prescribed    Plan:  Telephone follow up appointment with care management team member scheduled for:  12/31/23 at 4 pm The patient has been provided with contact information for the care management team and has been advised to call with any health related questions or concerns.         Patient verbalizes understanding of instructions and care plan provided today and agrees to view in MyChart. Active MyChart status and patient  understanding of how to access instructions and care plan via MyChart confirmed with patient.     The patient has been provided with contact information for the care management team and has been advised to call with any health related questions or concerns.   Please call the care guide team at [phone] if you need to cancel or reschedule your appointment.   Please call the Suicide and Crisis Lifeline: 988 call the [place]  National Suicide Prevention Lifeline: 862-654-3156 or TTY: 641-425-2867 TTY (520) 194-6039) to talk to a trained counselor call 1-800-273-TALK (toll free, 24 hour hotline) go to [facility] [address] [place] [phone]) if you are experiencing a Mental Health or Behavioral Health Crisis or need someone to talk to.  Arvin Seip RN, BSN, CCM [employer], Population Health Case Manager Phone: [phone]

## 2023-12-29 ENCOUNTER — Encounter: Payer: Self-pay | Admitting: *Deleted

## 2023-12-29 ENCOUNTER — Telehealth: Payer: Self-pay

## 2023-12-29 NOTE — Telephone Encounter (Signed)
 Form faxed back to [facility] @ [phone] with a completed transmission log

## 2023-12-29 NOTE — Telephone Encounter (Signed)
 Copied from CRM #[id number]. Topic: Clinical - Home Health Verbal Orders >> Dec 29, 2023  4:41 PM [place] J wrote: Caller/Agency: [facility] Callback Number: [phone] Service Requested: Skilled Nursing Frequency: 3x a week for week 1 & 2x a week for 8 weeks. Any new concerns about the patient? No *Voicemail box is secured.*

## 2023-12-31 ENCOUNTER — Other Ambulatory Visit: Payer: Self-pay

## 2023-12-31 ENCOUNTER — Ambulatory Visit: Admitting: Nurse Practitioner

## 2023-12-31 ENCOUNTER — Telehealth: Payer: Self-pay

## 2023-12-31 VITALS — BP 124/80 | HR 80 | Temp 98.3°F | Ht 71.0 in | Wt 256.6 lb

## 2023-12-31 DIAGNOSIS — Z7985 Long-term (current) use of injectable non-insulin antidiabetic drugs: Secondary | ICD-10-CM

## 2023-12-31 DIAGNOSIS — Z23 Encounter for immunization: Secondary | ICD-10-CM

## 2023-12-31 DIAGNOSIS — I10 Essential (primary) hypertension: Secondary | ICD-10-CM

## 2023-12-31 DIAGNOSIS — Z794 Long term (current) use of insulin: Secondary | ICD-10-CM

## 2023-12-31 DIAGNOSIS — L97512 Non-pressure chronic ulcer of other part of right foot with fat layer exposed: Secondary | ICD-10-CM | POA: Diagnosis not present

## 2023-12-31 DIAGNOSIS — E1142 Type 2 diabetes mellitus with diabetic polyneuropathy: Secondary | ICD-10-CM

## 2023-12-31 DIAGNOSIS — G8929 Other chronic pain: Secondary | ICD-10-CM

## 2023-12-31 DIAGNOSIS — N1831 Chronic kidney disease, stage 3a: Secondary | ICD-10-CM

## 2023-12-31 DIAGNOSIS — I4891 Unspecified atrial fibrillation: Secondary | ICD-10-CM

## 2023-12-31 DIAGNOSIS — M549 Dorsalgia, unspecified: Secondary | ICD-10-CM

## 2023-12-31 DIAGNOSIS — E538 Deficiency of other specified B group vitamins: Secondary | ICD-10-CM

## 2023-12-31 DIAGNOSIS — E1140 Type 2 diabetes mellitus with diabetic neuropathy, unspecified: Secondary | ICD-10-CM

## 2023-12-31 MED ORDER — TRESIBA FLEXTOUCH 200 UNIT/ML ~~LOC~~ SOPN
39.0000 [IU] | PEN_INJECTOR | Freq: Every day | SUBCUTANEOUS | Status: DC
Start: 2023-12-31 — End: 2024-02-15

## 2023-12-31 MED ORDER — CYANOCOBALAMIN 1000 MCG/ML IJ SOLN
1000.0000 ug | Freq: Once | INTRAMUSCULAR | Status: AC
  Administered 2023-12-31: 1000 ug via INTRAMUSCULAR

## 2023-12-31 MED ORDER — CALCITRIOL 0.25 MCG PO CAPS: 0.2500 ug | ORAL_CAPSULE | Freq: Every day | ORAL | 2 refills | Status: DC

## 2023-12-31 MED ORDER — FREESTYLE LIBRE 3 PLUS SENSOR MISC: 11 refills | Status: AC

## 2023-12-31 MED ORDER — SEMAGLUTIDE (2 MG/DOSE) 8 MG/3ML ~~LOC~~ SOPN: 2.0000 mg | PEN_INJECTOR | SUBCUTANEOUS | Status: AC

## 2023-12-31 MED ORDER — PREGABALIN 200 MG PO CAPS: 200.0000 mg | ORAL_CAPSULE | Freq: Two times a day (BID) | ORAL | 0 refills | Status: AC

## 2023-12-31 MED ORDER — CYCLOBENZAPRINE HCL 10 MG PO TABS: 10.0000 mg | ORAL_TABLET | Freq: Three times a day (TID) | ORAL | 5 refills | Status: DC | PRN

## 2023-12-31 MED ORDER — LOSARTAN POTASSIUM 25 MG PO TABS: ORAL_TABLET | ORAL | 3 refills | Status: AC

## 2023-12-31 NOTE — Progress Notes (Signed)
 Pt presented for their vitamin B12 injection. Pt was identified through two identifiers. Pt tolerated shot well in their left deltoid.   Patient is in office today for a visit for High Dose Flu Immunization. Patient Injection was given in the  Right deltoid. Patient tolerated injection well.

## 2023-12-31 NOTE — Patient Instructions (Signed)
 Visit Information  Thank you for taking time to visit with me today. Please don't hesitate to contact me if I can be of assistance to you before our next scheduled telephone appointment.  Our next appointment is by telephone on 01/08/24 at 2:30pm  Following is a copy of your care plan:   Goals Addressed             This Visit's Progress    VBCI Transitions of Care (TOC) Care Plan       Problems:  Recent Hospitalization for treatment of cellulitis and abscess of right LE Knowledge Deficit Related to management of cellulitis/ abscess of right LE  Goal:  Over the next 30 days, the patient will not experience hospital readmission  Interventions:  Transitions of Care: Reviewed Signs and symptoms of infection Reviewed 12/31/23 primary care visit note with patient. - discrepancy noted with patients tresiba  medication. Patient states he thought he was suppose to take 44 mg daily however primary care provider note states tresiba  40 mg day.  Message sent to primary care provider requesting clarification of tresiba  dose.  Confirmed patient contacted by home health company and services have started-  Patient states home health services have started and nurse came out to see patient today.  Assessed for Zio patch delivery- delivered per patient.  Assess for symptoms/ healing of Right LE wound- patient reports having follow up visit with podiatrist on 12/29/23 Reviewed upcoming provider visits Reminded patient that he has been referred to the social worker and to expect call.  Advised to take antibiotics until completed. Advised to call provider for any new symptoms or concerns.  Assessed right foot pain Discussed Hgb A1c Discussed podiatry instructions for right foot care- patient states he was advised by his podiatrist not to get his right foot wet therefore no more showers for now and to change dressing daily. Patient states his wife will do his dressing changes on his non home health days.     Patient Self Care Activities:  Call pharmacy for medication refills 3-7 days in advance of running out of medications Call provider office for new concerns or questions  Take medications as prescribed   Continue wound care to right foot as recommended by podiatrist.  Continue to take antibiotics until completed   Plan:  Telephone follow up appointment with care management team member scheduled for:  01/08/24 at 2:30 pm The patient has been provided with contact information for the care management team and has been advised to call with any health related questions or concerns.         Patient verbalizes understanding of instructions and care plan provided today and agrees to view in MyChart. Active MyChart status and patient understanding of how to access instructions and care plan via MyChart confirmed with patient.     The patient has been provided with contact information for the care management team and has been advised to call with any health related questions or concerns.   Please call the care guide team at [phone] if you need to cancel or reschedule your appointment.   Please call the Suicide and Crisis Lifeline: 988 call the [place]  National Suicide Prevention Lifeline: 203-755-0703 or TTY: (360)132-3186 TTY (334) 446-2043) to talk to a trained counselor call 1-800-273-TALK (toll free, 24 hour hotline) go to [facility] Urgent [facility] [address] [place] [phone]) if you are experiencing a Mental Health or Behavioral Health Crisis or need someone to talk to.  Arvin Seip RN, BSN,  CCM [facility], Population Health Case Manager Phone: [phone]

## 2023-12-31 NOTE — Progress Notes (Signed)
 [name] Pharm[facility] Note  12/31/2023 Name: Douglas [name] MRN: [id number] DOB: Aug 04, 1945  Referred By: Gretel App, NP Reason for referral: Complex Care Management and Call Attempt #1 (Successful initial outreach scheduled with PHARM D- Manuelita)   Douglas [name] is a 78 y.o. year old male who is a primary care patient of Gretel App, NP.  Douglas [name] was referred to the pharmacist for assistance related to: DMII  Successful contact was made with the patient to discuss pharmacy services including being ready for the pharmacist to call at least 5 minutes before the scheduled appointment time and to have medication bottles and any blood pressure readings ready for review. The patient agreed to meet with the pharmacist via telephone visit on (date/time). 01/05/24 @ 2 PM.  Douglas [name] [facility], Douglas [name]  Direct Dial: [phone]  Fax [phone]

## 2023-12-31 NOTE — Transitions of Care (Post Inpatient/ED Visit) (Signed)
 Transition of Care week 2  Visit Note  12/31/2023  Name: Douglas [name] MRN: [id number]          DOB: 1946/04/10  Situation: Patient enrolled in [facility] 30-day program. Visit completed with patient by telephone.   Background: Hospitalized from 12/20/23 to 12/24/23 for Cellulitis and abscess of right lower extremity      Past Medical History:  Diagnosis Date   Arthritis    Depression    Diabetes mellitus with complication (HCC)    Diabetic neuropathy (HCC)    Diastolic dysfunction    a. TTE 07/2017: EF 60-65%, mild concentric LVH, no RWMA, Gr1DD, trivial AI, mildly dilated LA, RVSF normal, PASP normal   Edema    feet/legs   GERD (gastroesophageal reflux disease)    Gout    History of stress test    a. MV 06/2017: small in size, mild in severity, apical anterior and apical defect that was minimally reversible and most likely represented artifact and less likely ischemia/scar, LVEF 55-65%, low risk, probably normal stress test   Hypertension    Hypothyroidism    Kidney cysts    renal failure 2013   Kidney stones    Dr. Waddell   OSA (obstructive sleep apnea)    No oxygen at all in 2-3 years.   [place] spotted fever 12/10/2017   Positve IgG in titer on 11/20/17   S/P cardiac cath 1998   a. no obstructive disease   Syncope and collapse    Temporary low platelet count (HCC)     Assessment: Patient Reported Symptoms: Cognitive Cognitive Status: No symptoms reported, Alert and oriented to person, place, and time, Insightful and able to interpret abstract concepts, Normal speech and language skills      Neurological Neurological Review of Symptoms: No symptoms reported    HEENT HEENT Symptoms Reported: No symptoms reported      Cardiovascular Cardiovascular Symptoms Reported: No symptoms reported    Respiratory Respiratory Symptoms Reported: No symptoms reported    Endocrine Endocrine Symptoms Reported: No symptoms reported Is patient diabetic?: Yes Is patient  checking blood sugars at home?: No List most recent blood sugar readings, include date and time of day: patient state he hasn't checked blood sugar since being in the hosptial. He reports seeing his primary care provider today and she has ordered a freestyle libre 3.    Gastrointestinal Gastrointestinal Symptoms Reported: No symptoms reported      Genitourinary Genitourinary Symptoms Reported: Incontinence Additional Genitourinary Details: patient states some incontinence of urine    Integumentary Integumentary Symptoms Reported: Wound Additional Integumentary Details: patient reports having follow up visit with podiatrist on 12/29/23. He states he was advised to no longer get foot wet and to change dressings daily. Patient states home health nurse comes out to do dressing change to foot 2 days per week and his wife will change his dressing on the other days. Patient states he continues to take his antibiotic as prescribed. Skin Management Strategies: Routine screening, Medication therapy, Dressing changes  Musculoskeletal Musculoskelatal Symptoms Reviewed: Unsteady gait Additional Musculoskeletal Details: patient reports using walker for ambulation Musculoskeletal Management Strategies: Routine screening, Medical device      Psychosocial Psychosocial Symptoms Reported: Depression - if selected complete PHQ 2-9 Additional Psychological Details: patient denies any change in depression symptoms.         There were no vitals filed for this visit.  Medications Reviewed Today     Reviewed by Ruchy Wildrick E, RN (Registered Nurse) on  12/31/23 at 1522  Med List Status: <None>   Medication Order Taking? Sig Documenting Provider Last Dose Status Informant  acetaminophen  (TYLENOL ) 500 MG tablet [id number] Yes Take 500 mg by mouth every 6 (six) hours as needed. [provider]  Active Self  aspirin  EC 81 MG tablet [id number] Yes Take 4 tablets (325 mg total) by mouth daily. Wouk, Devaughn Sayres, MD  Active   B Complex Vitamins (BL VITAMIN B COMPLEX PO) [id number] Yes Take 1 tablet by mouth daily. [provider]  Active Self  calcitRIOL  (ROCALTROL ) 0.25 MCG capsule [id number] Yes Take 1 capsule (0.25 mcg total) by mouth daily. Gretel App, NP  Active   carvedilol  (COREG ) 6.25 MG tablet [id number] Yes TAKE 1 TABLET(6.25 MG) BY MOUTH TWICE DAILY WITH A MEAL Gretel App, NP  Active Self  cefadroxil  (DURICEF) 500 MG capsule [id number] Yes Take 2 capsules (1,000 mg total) by mouth 2 (two) times daily for 7 days. Wouk, Devaughn Sayres, MD  Active   Cholecalciferol  (VITAMIN D3) 50 MCG (2000 UT) TABS [id number] Yes Take 1 tablet by mouth daily. [provider]  Active Self  clove oil liquid [id number] Yes Apply 1 application topically as needed. [provider]  Active Self           Med Note (BEJOS, ANJA J   Sun Dec 20, 2023 11:59 PM) PRN  colchicine  0.6 MG tablet [id number] Yes Take 2 tablets by mouth now x 1 dose then take 1 tablet by mouth one hour later Lester, Kacy, NP  Active Self           Med Note (BEJOS, ANJA J   Sun Dec 20, 2023 11:59 PM) PRN  Continuous Glucose Sensor (FREESTYLE LIBRE 3 PLUS SENSOR) [place] [id number]  Change sensor every 15 days.  Patient not taking: Reported on 12/31/2023   Gretel App, NP  Active   cyanocobalamin  (VITAMIN B12) 1000 MCG/ML injection [id number] Yes Inject 1,000 mcg into the muscle every 30 (thirty) days. [provider]  Active Self  cyclobenzaprine  (FLEXERIL ) 10 MG tablet [id number] Yes Take 1 tablet (10 mg total) by mouth 3 (three) times daily as needed. Gretel App, NP  Active   dapagliflozin  propanediol ([name] ) 10 MG TABS tablet [id number]  Take 1 tablet (10 mg total) by mouth daily. Gretel App, NP  Active Self  doxazosin  (CARDURA ) 4 MG tablet [id number] Yes Take 1 tablet (4 mg total) by mouth daily. Gretel App, NP  Active Self  DULoxetine  (CYMBALTA ) 60 MG capsule [id number] Yes TAKE 1 CAPSULE(60 MG) BY MOUTH  DAILY Maribeth Camellia MATSU, MD  Active Self  esomeprazole  (NEXIUM ) 40 MG capsule [id number] Yes Take 1 capsule (40 mg total) by mouth daily at 12 noon. Vannie Delon LABOR, MD  Active Self  furosemide  (LASIX ) 40 MG tablet [id number] Yes TAKE 1 TABLET(40 MG) BY MOUTH DAILY Gretel App, NP  Active Self  hydroxypropyl methylcellulose / hypromellose (ISOPTO TEARS / GONIOVISC) 2.5 % ophthalmic solution [id number] Yes 1 drop as needed for dry eyes. [provider]  Active Self           Med Note (BEJOS, ANJA J   Mon Dec 21, 2023 12:00 AM) PRN  insulin  degludec ([name]  FLEXTOUCH) 200 UNIT/ML FlexTouch Pen [id number] Yes Inject 40 Units into the skin daily. Gretel App, NP  Active            Med Note DORI, Ruchama Kubicek E   Thu Dec 31, 2023  3:21 PM) Patient states he is taking  44 units daily.  Request clarification from primary care provider.   Insulin  Pen Needle (NOVOFINE PEN NEEDLE) 32G X 6 MM MISC [id number] Yes 1 each by Does not apply route 2 (two) times daily. Maribeth Camellia MATSU, MD  Active Self  levothyroxine  (SYNTHROID ) 100 MCG tablet [id number] Yes TAKE 1 TABLET(100 MCG) BY MOUTH DAILY BEFORE OFILIA Gretel App, NP  Active Self  loratadine  (CLARITIN ) 10 MG tablet [id number] Yes Take 10 mg by mouth daily as needed for allergies. [provider]  Active Self           Med Note (BEJOS, ANJA J   Mon Dec 21, 2023 12:00 AM) PRN  losartan  (COZAAR ) 25 MG tablet [id number] Yes TAKE 1 TABLET BY MOUTH EVERY MORNING THEN TAKE 2 TABLETS BY MOUTH EVERY KARNA Gretel App, NP  Active   Magnesium  250 MG TABS [id number] Yes Take 250 mg by mouth 2 (two) times daily. [provider]  Active Self  mometasone  (NASONEX ) 50 MCG/ACT nasal spray [id number] Yes Place 2 sprays into the nose daily. Maribeth Camellia MATSU, MD  Active Self  Multiple Vitamins-Minerals (MULTIVITAMIN MEN PO) [id number] Yes Take 1 tablet by mouth daily. [provider]  Active Self  Needle, Disp, (HYPODERMIC NEEDLE 18GX1) 18G  X 1 MISC [id number] Yes Use every 14 days to draw up testosterone  [provider]  Active Self  Needle, Disp, (HYPODERMIC NEEDLE 25GX1) 25G X 1 MISC [id number] Yes Use every 14 days to administer testosterone  [provider]  Active Self  pravastatin  (PRAVACHOL ) 20 MG tablet [id number] Yes Take 1 tablet (20 mg total) by mouth daily. Gretel App, NP  Active Self           Med Note (BEJOS, ANJA J   Mon Dec 21, 2023 12:12 AM) Per office visit notes, ot was switched from simvastatin  to pravastatin  due to muscle cramps  pregabalin  (LYRICA ) 200 MG capsule [id number] Yes Take 1 capsule (200 mg total) by mouth 2 (two) times daily. Gretel App, NP  Active   Semaglutide , 2 MG/DOSE, 8 MG/3ML SOPN [id number] Yes Inject 2 mg as directed once a week. Gretel App, NP  Active   tadalafil (CIALIS) 20 MG tablet [id number] Yes Take 20 mg by mouth daily as needed. [provider]  Active Self  testosterone  cypionate (DEPOTESTOSTERONE CYPIONATE) 200 MG/ML injection [id number] Yes Inject 200 mg into the muscle every 14 (fourteen) days. [provider]  Active Self            Goals Addressed             This Visit's Progress    VBCI Transitions of Care (TOC) Care Plan       Problems:  Recent Hospitalization for treatment of cellulitis and abscess of right LE Knowledge Deficit Related to management of cellulitis/ abscess of right LE  Goal:  Over the next 30 days, the patient will not experience hospital readmission  Interventions:  Transitions of Care: Reviewed Signs and symptoms of infection Reviewed 12/31/23 primary care visit note with patient. - discrepancy noted with patients tresiba  medication. Patient states he thought he was suppose to take 44 mg daily however primary care provider note states tresiba  40 mg day.  Message sent to primary care provider requesting clarification of tresiba  dose.  Confirmed patient contacted by home health company and services have  started-  Patient states home health services have started and nurse came out to see patient today.  Assessed for Zio patch delivery- delivered per patient.  Assess for symptoms/ healing of Right LE wound- patient reports having follow up visit with podiatrist on 12/29/23 Reviewed upcoming provider visits Reminded patient that he has been referred to the social worker and to expect call.  Advised to take antibiotics until completed. Advised to call provider for any new symptoms or concerns.  Assessed right foot pain Discussed Hgb A1c Discussed podiatry instructions for right foot care- patient states he was advised by his podiatrist not to get his right foot wet therefore no more showers for now and to change dressing daily. Patient states his wife will do his dressing changes on his non home health days.    Patient Self Care Activities:  Call pharmacy for medication refills 3-7 days in advance of running out of medications Call provider office for new concerns or questions  Take medications as prescribed   Continue wound care to right foot as recommended by podiatrist.  Continue to take antibiotics until completed   Plan:  Telephone follow up appointment with care management team member scheduled for:  01/08/24 at 2:30 pm The patient has been provided with contact information for the care management team and has been advised to call with any health related questions or concerns.          Recommendation:   Continue Current Plan of Care  Follow Up Plan:   Telephone follow-up in 1 week 01/08/24 at 2:30 pm  Arvin Seip RN, BSN, CCM [place]  [facility], Population Health Case Manager Phone: [phone]

## 2023-12-31 NOTE — Progress Notes (Signed)
 Douglas Glance, NP-C Phone: [phone]  Douglas [name] is a 78 y.o. male who presents today for hospital follow up.   Discussed the use of AI scribe software for clinical note transcription with the patient, who gave verbal consent to proceed.  History of Present Illness   Douglas [name] is a 78 year old male with diabetes and atrial fibrillation who presents for follow-up after hospitalization for a right toe ulcer.  He was hospitalized last week for four days due to a severe ulcer on his right toe, which was blistered and red. During the hospital stay, he received IV antibiotics, incision and drainage, and debridement. He was discharged on oral antibiotics, which he continues to take. No fever, chills, nausea, vomiting, or pain since the podiatrist worked on his foot. He is receiving wound care at home three times a week and sees a podiatrist weekly.  He reports a new issue with his left foot, experiencing stinging and burning sensations since Wednesday night. He applied clove oil, which provided relief after about an hour.  He has a history of diabetes and is currently on Ozempic  2 mg and [name]  44 units daily. He does not regularly check his blood sugar at home. His A1c was 7.5 during his hospital stay. He notes difficulty in maintaining exercise due to leg pain and concerns about his feet.  He was diagnosed with atrial fibrillation during his hospital stay, although his EKG did not consistently show AFib. He is scheduled to wear a cardiac monitor for a month and has a follow-up with cardiology. He experiences occasional 'buzzy' sensations, which he attributes to AFib. His mother also had a history of AFib.  He takes losartan , carvedilol , and doxazosin  for blood pressure management. He reports a dizzy spell last night but does not regularly check his blood pressure at home. He previously took hydrocodone  for pain but stopped due to side effects. He was on Lyrica  225 mg but reduced  it to 200 mg due to feeling 'bouncing off the walls.' He also mentions a need for a prescription for calcitriol  and has discontinued finasteride  as advised by another doctor.  He reports a history of gout and has used colchicine  in the past, which helped with symptoms. He is not currently taking allopurinol .      Social History   Tobacco Use  Smoking Status Never  Smokeless Tobacco Never    Current Outpatient Medications on File Prior to Visit  Medication Sig Dispense Refill   acetaminophen  (TYLENOL ) 500 MG tablet Take 500 mg by mouth every 6 (six) hours as needed.     aspirin  EC 81 MG tablet Take 4 tablets (325 mg total) by mouth daily.     B Complex Vitamins (BL VITAMIN B COMPLEX PO) Take 1 tablet by mouth daily.     carvedilol  (COREG ) 6.25 MG tablet TAKE 1 TABLET(6.25 MG) BY MOUTH TWICE DAILY WITH A MEAL 180 tablet 2   Cholecalciferol  (VITAMIN D3) 50 MCG (2000 UT) TABS Take 1 tablet by mouth daily.     clove oil liquid Apply 1 application topically as needed.     colchicine  0.6 MG tablet Take 2 tablets by mouth now x 1 dose then take 1 tablet by mouth one hour later 20 tablet 0   cyanocobalamin  (VITAMIN B12) 1000 MCG/ML injection Inject 1,000 mcg into the muscle every 30 (thirty) days.     [Paused] dapagliflozin  propanediol ([name] ) 10 MG TABS tablet Take 1 tablet (10 mg total) by mouth daily. (Patient  not taking: Reported on 01/05/2024) 90 tablet 3   doxazosin  (CARDURA ) 4 MG tablet Take 1 tablet (4 mg total) by mouth daily. 90 tablet 3   DULoxetine  (CYMBALTA ) 60 MG capsule TAKE 1 CAPSULE(60 MG) BY MOUTH DAILY 90 capsule 3   esomeprazole  (NEXIUM ) 40 MG capsule Take 1 capsule (40 mg total) by mouth daily at 12 noon. 90 capsule 3   furosemide  (LASIX ) 40 MG tablet TAKE 1 TABLET(40 MG) BY MOUTH DAILY 90 tablet 1   hydroxypropyl methylcellulose / hypromellose (ISOPTO TEARS / GONIOVISC) 2.5 % ophthalmic solution 1 drop as needed for dry eyes.     Insulin  Pen Needle (NOVOFINE PEN NEEDLE) 32G  X 6 MM MISC 1 each by Does not apply route 2 (two) times daily. 1 each 3   levothyroxine  (SYNTHROID ) 100 MCG tablet TAKE 1 TABLET(100 MCG) BY MOUTH DAILY BEFORE BREAKFAST 90 tablet 3   loratadine  (CLARITIN ) 10 MG tablet Take 10 mg by mouth daily as needed for allergies.     Magnesium  250 MG TABS Take 250 mg by mouth 2 (two) times daily.     mometasone  (NASONEX ) 50 MCG/ACT nasal spray Place 2 sprays into the nose daily. 1 each 12   Multiple Vitamins-Minerals (MULTIVITAMIN MEN PO) Take 1 tablet by mouth daily.     Needle, Disp, (HYPODERMIC NEEDLE 18GX1) 18G X 1 MISC Use every 14 days to draw up testosterone      Needle, Disp, (HYPODERMIC NEEDLE 25GX1) 25G X 1 MISC Use every 14 days to administer testosterone      pravastatin  (PRAVACHOL ) 20 MG tablet Take 1 tablet (20 mg total) by mouth daily. 90 tablet 3   tadalafil (CIALIS) 20 MG tablet Take 20 mg by mouth daily as needed.     testosterone  cypionate (DEPOTESTOSTERONE CYPIONATE) 200 MG/ML injection Inject 200 mg into the muscle every 14 (fourteen) days.     No current facility-administered medications on file prior to visit.    ROS see history of present illness  Objective  Physical Exam Vitals:   12/31/23 1049  BP: 124/80  Pulse: 80  Temp: 98.3 F (36.8 C)  SpO2: 97%    BP Readings from Last 3 Encounters:  12/31/23 124/80  12/24/23 128/68  12/03/23 110/82   Wt Readings from Last 3 Encounters:  12/31/23 256 lb 9.6 oz (116.4 kg)  12/20/23 265 lb (120.2 kg)  12/03/23 265 lb (120.2 kg)    Physical Exam Constitutional:      General: He is not in acute distress.    Appearance: Normal appearance.  HENT:     Head: Normocephalic.  Cardiovascular:     Rate and Rhythm: Normal rate and regular rhythm.     Heart sounds: Normal heart sounds.  Pulmonary:     Effort: Pulmonary effort is normal.     Breath sounds: Normal breath sounds.  Skin:    General: Skin is warm and dry.  Neurological:     General: No focal deficit  present.     Mental Status: He is alert.  Psychiatric:        Mood and Affect: Mood normal.        Behavior: Behavior normal.      Assessment/Plan: Please see individual problem list.  Neuropathic ulcer of toe of right foot with fat layer exposed (HCC) Assessment & Plan: New large ulcer on his right foot, previously treated with IV antibiotics, incision, drainage, and debridement, shows no signs of osteomyelitis. He is stable on oral antibiotics and home wound care.  Continue oral antibiotics as prescribed and home health wound care three times a week. Follow up with podiatry weekly.   DM type 2 with diabetic peripheral neuropathy (HCC) Assessment & Plan: His type 2 diabetes is suboptimally controlled with an A1c of 7.5. He is on Ozempic  and [name] . Insurance issues with Freestyle Libre CGM have been resolved. Prescribe Freestyle Libre CGM and refer to a pharmacist for CGM management and potential medication adjustments. Continue Ozempic  and [name]  as prescribed. Encourage healthy diet.   Orders: -     FreeStyle Libre 3 Plus Sensor; Change sensor every 15 days.  Dispense: 2 each; Refill: 11 -     [name]  FlexTouch; Inject 40 Units into the skin daily. -     Pregabalin ; Take 1 capsule (200 mg total) by mouth 2 (two) times daily.  Dispense: 180 capsule; Refill: 0 -     Semaglutide  (2 MG/DOSE); Inject 2 mg as directed once a week. -     AMB Referral VBCI Care Management  New onset atrial fibrillation [facility]) Assessment & Plan: Intermittent atrial fibrillation was noted during hospitalization. A cardiac monitor is planned to assess AFib burden. He should wear the cardiac monitor for one month and follow up with cardiology next month.    Essential hypertension Assessment & Plan: His essential hypertension showed elevated blood pressure in the hospital, likely related to pain. He is on losartan , carvedilol , and doxazosin . Blood pressure stable today in office. Continue current medication  regimen.   Orders: -     Losartan  Potassium; TAKE 1 TABLET BY MOUTH EVERY MORNING THEN TAKE 2 TABLETS BY MOUTH EVERY EVENING  Dispense: 270 tablet; Refill: 3  Chronic painful diabetic neuropathy (HCC) Assessment & Plan: Diabetic polyneuropathy is managed with Lyrica . Decrease Lyrica  to 200 mg due to side effects. PDMP reviewed. We will continue to monitor.   Orders: -     Pregabalin ; Take 1 capsule (200 mg total) by mouth 2 (two) times daily.  Dispense: 180 capsule; Refill: 0  Chronic back pain greater than 3 months duration -     Cyclobenzaprine  HCl; Take 1 tablet (10 mg total) by mouth 3 (three) times daily as needed.  Dispense: 30 tablet; Refill: 5  Stage 3a chronic kidney disease (HCC) -     Calcitriol ; Take 1 capsule (0.25 mcg total) by mouth daily.  Dispense: 30 capsule; Refill: 2  B12 deficiency -     Cyanocobalamin   Need for influenza vaccination -     Flu vaccine HIGH DOSE PF(Fluzone Trivalent)     Return in about 3 months (around 03/31/2024) for Follow up.   Douglas Glance, NP-C [facility]

## 2024-01-01 ENCOUNTER — Ambulatory Visit: Payer: Self-pay

## 2024-01-01 NOTE — Telephone Encounter (Signed)
 FYI Only or Action Required?: FYI only for provider.  Patient was last seen in primary care on 12/31/2023 by Gretel App, NP.  Called Nurse Triage reporting Wound Infection.  Symptoms began several days ago.  Interventions attempted: Nothing.  Symptoms are: unchanged.  Triage Disposition: See HCP Within 4 Hours (Or PCP Triage)  Patient/caregiver understands and will follow disposition?: Yes Reason for Disposition  [1] Finger or toe wound AND [2] entire finger or toe swollen  Answer Assessment - Initial Assessment Questions Tasha with homehealth calling. Not warm to touch. Advised UC or ED for possible infection  1. LOCATION: Where is the wound located?      Base of big toe on the foot  2. WOUND APPEARANCE: What does the wound look like?      Swollen, redness around perimeter stated it was draining yesterday  3. SIZE: If redness is present, ask: What is the size of the red area? (Inches, centimeters, or compare to size of a coin)      Big toe swollen and around the wound is swollen and red  4. SPREAD: What's changed in the last day?  Do you see any red streaks coming from the wound?     Redness, streaking and drainage   5. ONSET: When did it start to look infected?      Sept 9th  7. PAIN: Do you have any pain?  If Yes, ask: How bad is the pain?  (e.g., Scale 1-10; mild, moderate, or severe)     Stated was in severe pain last night  8. FEVER: Do you have a fever? If Yes, ask: What is your temperature, how was it measured, and when did it start?     No  Protocols used: Wound Infection Suspected-A-AH  Copied from CRM #[id number]. Topic: Clinical - Pink Word Triage >> Jan 01, 2024  4:13 PM Tysheama G wrote: Reason for Triage: Patient wound on right foot may be infected. Big toe is gray and around wound site is swollen. Callback [phone] Lorenza (she will be with him for 20 more minutes) >> Jan 01, 2024  4:14 PM Tysheama G wrote: Patient wound on right  foot may be infected. Big toe is gray and around wound site is swollen.

## 2024-01-01 NOTE — Telephone Encounter (Signed)
   Message from [place] G sent at 01/01/2024  4:14 PM EDT  Patient wound on right foot may be infected. Big toe is gray and around wound site is swollen.

## 2024-01-04 ENCOUNTER — Telehealth: Payer: Self-pay

## 2024-01-04 MED ORDER — FREESTYLE LIBRE 3 READER DEVI: 1.0000 | Freq: Every day | 0 refills | Status: AC

## 2024-01-04 NOTE — Addendum Note (Signed)
 Addended by: ORLANDO KINGDOM on: 01/04/2024 09:35 AM   Modules accepted: Orders

## 2024-01-04 NOTE — Telephone Encounter (Signed)
 Copied from CRM #[id number]. Topic: Clinical - Medication Question >> Jan 04, 2024  9:18 AM Franky GRADE wrote: Reason for RMF:Ejupzwu picked up the [facility] 3 sending units but does not have the reader, he does not have a smart phone to use as the reader and the pharmacy advise that he reaches out to his provider to put in a prescription for the reading device.

## 2024-01-04 NOTE — Telephone Encounter (Signed)
 Called and spoke with pharmacy who informed me that Freestyle Libre 3 Plus does not have a reader, but that we can send in the regular 3 to see if it works they do not know if it is interchangeable or not I called and informed pt as well that we can see if the regular 3 reader will work but that Freestyle has not created a reader for the Plus

## 2024-01-05 ENCOUNTER — Telehealth: Payer: Self-pay

## 2024-01-05 ENCOUNTER — Other Ambulatory Visit (INDEPENDENT_AMBULATORY_CARE_PROVIDER_SITE_OTHER): Admitting: Pharmacist

## 2024-01-05 DIAGNOSIS — Z794 Long term (current) use of insulin: Secondary | ICD-10-CM

## 2024-01-05 DIAGNOSIS — E1165 Type 2 diabetes mellitus with hyperglycemia: Secondary | ICD-10-CM

## 2024-01-05 NOTE — Progress Notes (Signed)
 01/05/2024 Name: Douglas [name] MRN: [id number] DOB: 1945/08/15  Subjective  Chief Complaint  Patient presents with   Diabetes    Reason for visit: ?  Douglas [name] is a 78 y.o. male with a history of diabetes (type 2), who presents today for an initial diabetes pharmacotherapy visit.? Patient was referred by PCP for Diabetes management, medication access, adherence.  Pertinent PMH also includes HTN, OSA, hypothyroidism, secondary hyperparathyroidism, CKD, BPH, HLD, gout, GAD, MDD.  Known DM Complications: autonomic neuropathy (ED, peripheral neuropathy), current ulcer of R toe, nephropathy.  Care Team: Primary Care Provider: Gretel App, NP   Date of Last Diabetes Related Visit: with PCP on 12/31/23   Medication Access/Adherence: Prescription drug coverage: Payor: [employer] / Plan: [employer] PPO / Product Type: *No Product type* / .  - Reports that all medications are affordable.  Notes one prescription is ~$40. Believeslevothyroxine .  - Current Patient Assistance: Novo (Ozempic , [name]  U200), [name] ([name] ) - Medication Adherence: Patient denies missing doses of their medication.    Since Last visit / History of Present Illness: ?  Reports doing well overall. Confirms he has picked up new Libre 3+ sensors. Placed his first sensor on the back of his arm without issues. No questions regarding application process (notes he previously used Libre 2 for [employer]).  Does not have compatible smart phone. Is still awaiting rx for [facility] reader. Pharmacy told him this should be ready today or tomorrow.   Denies issues in recent months with PAP refills. Continues to receive [name] , [name] , Ozempic . Notes 3 weeks of Ozempic  remaining, has not received update regarding next shipment.   Reported DM Regimen: ?  [name]  U200 44 units once daily  Ozempic  2 mg weekly (Mondays)  SMBG: Libre 3+ (reader)  ?  Not set up yet. Plans to pick up reader from pharmacy  tomorrow    Hypo/Hyperglycemia: ?  Symptoms of hypoglycemia since last visit:? no  Symptoms of hyperglycemia since last visit:? no   DM Prevention:  Statin: Taking; low intensity pravastatin .?(muscle symptoms on simva) History of chronic kidney disease? yes History of albuminuria? Yes - last UACR = 12/16/22 (outside facility) = 56 mg/g ACE/ARB - Taking losartan  25 mg; Urine MA/CR Ratio - elevated urinary albumin excretion.  Last eye exam: 2021; No retinopathy present - DUE Last foot exam: 04/01/2023 Tobacco Use: Never smoker  Immunizations:? Flu: Up to date (Last: 12/31/2023); Pneumococcal: PPSV23 (2012) PCV13 (2015) after age 82; Shingrix: No Record - DUE  Cardiovascular Risk Reduction History of clinical ASCVD? no The 10-year ASCVD risk score (Arnett DK, et al., 2019) is: 63.1% History of heart failure? no History of hyperlipidemia? yes Current BMI: 35.8 kg/m2 (Ht 71 in, Wt 116.4 kg) Taking statin? yes; low intensity (pravastatin  40) Taking aspirin ? Taking   Taking SGLT-2i? yes (held at present time) Taking GLP- 1 RA? yes      _______________________________________________  Objective    Review of Systems:? Limited in the setting of virtual visit  Constitutional:? No fever, chills or unintentional weight loss  GI:? No nausea, vomiting, constipation, diarrhea, abdominal pain, dyspepsia, change in bowel habits  Endocrine:? No polyuria, polyphagia or blurred vision    Physical Examination:  Vitals:  Wt Readings from Last 3 Encounters:  12/31/23 256 lb 9.6 oz (116.4 kg)  12/20/23 265 lb (120.2 kg)  12/03/23 265 lb (120.2 kg)   BP Readings from Last 3 Encounters:  12/31/23 124/80  12/24/23 128/68  12/03/23 110/82   Pulse Readings from  Last 3 Encounters:  12/31/23 80  12/24/23 69  12/03/23 85    Labs:?  Lab Results  Component Value Date   HGBA1C 7.5 (H) 12/20/2023   HGBA1C 7.5 (H) 10/01/2023   HGBA1C 7.9 (H) 07/01/2023   GLUCOSE 119 (H) 12/24/2023   CREATININE  1.66 (H) 12/24/2023   CREATININE 1.61 (H) 12/23/2023   CREATININE 1.69 (H) 12/22/2023   GFR 46.99 (L) 10/01/2023   GFR 47.45 (L) 07/21/2023   GFR 42.15 (L) 04/01/2023    Lab Results  Component Value Date   CHOL 131 10/01/2023   LDLCALC 57 10/01/2023   LDLCALC 66 04/01/2023   LDLCALC 40 11/23/2019   LDLDIRECT 106.0 03/05/2022   LDLDIRECT 40.0 12/19/2020   HDL 38.00 (L) 10/01/2023   TRIG 181.0 (H) 10/01/2023   TRIG 249.0 (H) 04/01/2023   TRIG 261.0 (H) 03/05/2022   ALT 14 12/20/2023   ALT 25 10/01/2023   AST 22 12/20/2023   AST 23 10/01/2023      Chemistry      Component Value Date/Time   NA 138 12/24/2023 0506   NA 140 11/12/2018 1142   NA 142 12/04/2012 0414   K 3.8 12/24/2023 0506   K 4.4 12/04/2012 0414   CL 103 12/24/2023 0506   CL 110 (H) 12/04/2012 0414   CO2 27 12/24/2023 0506   CO2 26 12/04/2012 0414   BUN 22 12/24/2023 0506   BUN 13 11/12/2018 1142   BUN 32 (H) 12/04/2012 0414   CREATININE 1.66 (H) 12/24/2023 0506   CREATININE 1.30 (H) 11/17/2016 1012      Component Value Date/Time   CALCIUM 8.5 (L) 12/24/2023 0506   CALCIUM 9.1 12/04/2012 0414   ALKPHOS 74 12/20/2023 2048   ALKPHOS 64 12/02/2012 2139   AST 22 12/20/2023 2048   AST 81 (H) 12/02/2012 2139   ALT 14 12/20/2023 2048   ALT 74 12/02/2012 2139   BILITOT 3.1 (H) 12/20/2023 2048   BILITOT 0.6 11/12/2018 1142   BILITOT 0.5 12/02/2012 2139     The 10-year ASCVD risk score (Arnett DK, et al., 2019) is: 63.1%  Assessment and Plan:   1. Diabetes, type 2: uncontrolled per last A1c of 7.5% (12/20/23), unchanged from previous, 7.5%. Goal <7% without hypoglycemia. Newly prescribed Libre 3+ though awaiting reader to be filled at pharmacy. Not checking home sugars currently.  Current Regimen: [name]  U200 44u/d, Ozempic  2 mg/wk, [name]  (held) HCM: UACR overdue Continue medications today without changes.  CGM review 1 week s/p Libre start Reviewed s/sx/tx hypoglycemia Future  Consideration: GLP1-RA: Novo reorder form faxed 9/16 per chart. Per automated system, refil is in process.  SGLT2i: held currently w active LE ulcer/cellulitis Metformin : renal impairment   SU: Not unreasonable, though defer given alternative options with lower risk of hypoglycemia.     2. HTN: controlled based on last clinic BP of 124/80 mmHg (12/31/23), goal <130/80 mmHg. Does not monitor BP at home. Denies lightheadedness, dizziness, SOB, CP, vision changes.  Continue medications without changes.    3. ASCVD (primary prevention): Fairly controlled on last lipid panel with LDL 57 mg/dL, TG 818 (H) mg/dL (3/80/74). LDL goal <55 mg/dL (primary prevention with diabetes and multiple risk factors). High intensity statin preferred, though close to goal on current regimen. Advanced age, may not see large benefit with change in therapy.  Key risk factors include: diabetes, hypertension, hyperlipidemia, and BMI >30 kg/m2 The 10-year ASCVD risk score (Arnett DK, et al., 2019) is: 63.1% indicates patient is at HIGH  risk.  Current Regimen: pravastatin  20 mg daily Continue medications today without changes   4. Healthcare Maintenance:  Pneumococcal - Current status: Up to date. PCV20 reasonable.   Shingles - Current status: No record - DUE Influenza - Current status: Up to date  Due to receive the following vaccines: Shingrix and Covid Booster.  Risk/benefit discussion of PCV20 reasonable given high risk    Follow Up Follow up with clinical pharmacist via phone in 1 week for new CGM review Patient given direct line for questions regarding medication therapy PCP 21-month f/u scheduled for Dec.   Future Appointments  Date Time Provider Department Center  01/08/2024  2:00 PM [place], [place], [place] [facility] None  01/08/2024  2:30 PM Landy Arvin BRAVO, RN [facility] None  01/11/2024 10:00 AM [facility]  01/12/2024  2:00 PM [facility] PHARMACIST [facility] [address]  01/13/2024 10:00 AM  [facility] CLINICAL SUPPORT [facility] [address]  01/26/2024  2:45 PM [place], Barnie, NP [facility] None  02/11/2024 11:00 AM Claudene Arthea HERO, DO [facility] None  02/17/2024 11:00 AM [place], Glendia BROCKS, MD [facility] None  03/31/2024 10:00 AM Gretel App, NP [facility] [address]  11/14/2024  1:00 PM [facility] ANNUAL WELLNESS VISIT [facility] [address] Manuelita FABIENE Geronimo, PharmD Clinical Pharmacist [facility] [phone]

## 2024-01-05 NOTE — Telephone Encounter (Signed)
 [employer] forms placed in provider to be signed folder

## 2024-01-06 ENCOUNTER — Other Ambulatory Visit (HOSPITAL_COMMUNITY): Payer: Self-pay

## 2024-01-06 ENCOUNTER — Telehealth: Payer: Self-pay

## 2024-01-06 NOTE — Telephone Encounter (Signed)
 Prior auth submitted

## 2024-01-06 NOTE — Telephone Encounter (Signed)
 Copied from CRM #[id number]. Topic: Clinical - Medication Prior Auth >> Jan 06, 2024 11:14 AM Franky GRADE wrote: Reason for CRM: Patient was informed by the pharmacy that the Continuous Glucose Receiver (FREESTYLE LIBRE 3 READER) DEVI [[id number]] requires a prior auth.

## 2024-01-07 ENCOUNTER — Telehealth: Payer: Self-pay

## 2024-01-07 NOTE — Telephone Encounter (Signed)
 [employer] form placed in provider to be signed folder

## 2024-01-07 NOTE — Telephone Encounter (Signed)
 Pt's spouse came in to pick up reader today

## 2024-01-07 NOTE — Telephone Encounter (Signed)
 Pharmacy Patient Advocate Encounter  Received notification from [employer] that Prior Authorization for [facility] 3 plus sensors has been DENIED.  Full denial letter will be uploaded to the media tab. See denial reason below.   PA #/Case ID/Reference #: --  *Is covered under Part B not Part D. Spoke with [facility] to process.

## 2024-01-07 NOTE — Telephone Encounter (Signed)
 Pharmacy Patient Advocate Encounter  Received notification from [employer] that Prior Authorization for FreeStyle [place] 3 Reader device has been DENIED.  Full denial letter will be uploaded to the media tab. See denial reason below.  This request was denied under your Medicare Part D benefit; however, coverage/payment for the requested CGM(s) is COVERED under Medicare Part B as long as your doctor continues to prescribe the drug for you, and it continues to be safe and effective  PA #/Case ID/Reference #: [id number]

## 2024-01-08 ENCOUNTER — Other Ambulatory Visit: Payer: Self-pay

## 2024-01-08 ENCOUNTER — Telehealth: Payer: Self-pay | Admitting: *Deleted

## 2024-01-08 ENCOUNTER — Encounter: Payer: Self-pay | Admitting: Licensed Clinical Social Worker

## 2024-01-08 ENCOUNTER — Telehealth: Admitting: Licensed Clinical Social Worker

## 2024-01-08 NOTE — Patient Instructions (Signed)
 Visit Information  Thank you for taking time to visit with me today. Please don't hesitate to contact me if I can be of assistance to you before our next scheduled telephone appointment.  Our next appointment is by telephone on 01/15/24 at 1 pm  Following is a copy of your care plan:   Goals Addressed             This Visit's Progress    VBCI Transitions of Care (TOC) Care Plan       Problems:  Recent Hospitalization for treatment of cellulitis and abscess of right LE Knowledge Deficit Related to management of cellulitis/ abscess of right LE  Goal:  Over the next 30 days, the patient will not experience hospital readmission  Interventions:  Transitions of Care: Reviewed Signs and symptoms of infection Confirmed patient received clarification regarding his [name]  dose - patient states he received clarification on his dosage from his provider. He states he is to take 44 units daily.  Confirmed patient receiving ongoing home health  Assessed for Zio patch delivery- patient states he never received he zio patch. Patient advised to notify his cardiology office that he did not receive the Zio patch  Assess for symptoms/ healing of Right LE wound-  Reviewed upcoming provider visits Reminded patient that he has been referred to the social worker and to expect call.  Advised to take antibiotics until completed. Advised to call provider for any new symptoms or concerns.  Message sent to primary care provider of drug to drug notification interaction for doxycycline  Assessed right foot pain Confirmed patient had follow up visit with podiatrist.    Patient Self Care Activities:  Call pharmacy for medication refills 3-7 days in advance of running out of medications Call provider office for new concerns or questions  Take medications as prescribed   Continue wound care to right foot as recommended by podiatrist. Notify provider for signs of infection Notify your cardiologist that you did  not receive the zio patch Continue to take antibiotics until completed Continue to change bandage to foot as recommended by podiatrist.    Plan:  Telephone follow up appointment with care management team member scheduled for:  01/15/24 at 1pm The patient has been provided with contact information for the care management team and has been advised to call with any health related questions or concerns.         The patient verbalized understanding of instructions, educational materials, and care plan provided today and DECLINED offer to receive copy of patient instructions, educational materials, and care plan.   The patient has been provided with contact information for the care management team and has been advised to call with any health related questions or concerns.   Please call the care guide team at [phone] if you need to cancel or reschedule your appointment.   Please call the Suicide and Crisis Lifeline: 988 call the [place]  National Suicide Prevention Lifeline: 610-805-8555 or TTY: 403 406 2794 TTY (305)321-0040) to talk to a trained counselor call 1-800-273-TALK (toll free, 24 hour hotline) if you are experiencing a Mental Health or Behavioral Health Crisis or need someone to talk to.  Arvin Seip RN, BSN, CCM [employer], Population Health Case Manager Phone: [phone]

## 2024-01-08 NOTE — Transitions of Care (Post Inpatient/ED Visit) (Signed)
 Transition of Care week 3  Visit Note  01/08/2024  Name: Douglas [name] MRN: [id number]          DOB: Dec 27, 1945  Situation: Patient enrolled in [facility] 30-day program. Visit completed with patient by telephone.   Background:  Hospitalized from 12/20/23 to 12/24/23 for Cellulitis and abscess of right lower extremity      Past Medical History:  Diagnosis Date   Arthritis    Depression    Diabetes mellitus with complication (HCC)    Diabetic neuropathy (HCC)    Diastolic dysfunction    a. TTE 07/2017: EF 60-65%, mild concentric LVH, no RWMA, Gr1DD, trivial AI, mildly dilated LA, RVSF normal, PASP normal   Edema    feet/legs   GERD (gastroesophageal reflux disease)    Gout    History of stress test    a. MV 06/2017: small in size, mild in severity, apical anterior and apical defect that was minimally reversible and most likely represented artifact and less likely ischemia/scar, LVEF 55-65%, low risk, probably normal stress test   Hypertension    Hypothyroidism    Kidney cysts    renal failure 2013   Kidney stones    Dr. Waddell   OSA (obstructive sleep apnea)    No oxygen at all in 2-3 years.   [place] spotted fever 12/10/2017   Positve IgG in titer on 11/20/17   S/P cardiac cath 1998   a. no obstructive disease   Syncope and collapse    Temporary low platelet count     Assessment: Patient Reported Symptoms: Cognitive Cognitive Status: No symptoms reported, Alert and oriented to person, place, and time, Insightful and able to interpret abstract concepts, Normal speech and language skills      Neurological Neurological Review of Symptoms: No symptoms reported    HEENT HEENT Symptoms Reported: No symptoms reported      Cardiovascular Cardiovascular Symptoms Reported: Other: Other Cardiovascular Symptoms: patient states he never received the zio patch. He states he contacted the zio patch company and they advised him that an order was never received.    Respiratory  Respiratory Symptoms Reported: No symptoms reported    Endocrine Endocrine Symptoms Reported: No symptoms reported Is patient diabetic?: Yes Is patient checking blood sugars at home?: Yes List most recent blood sugar readings, include date and time of day: patient states he received the free style libre on yesterday. He reports his average since apply monitor on yesterday is 148 and he states his fasting blood sugar this morning was 130.    Gastrointestinal Gastrointestinal Symptoms Reported: No symptoms reported      Genitourinary Genitourinary Symptoms Reported: No symptoms reported Additional Genitourinary Details: patient states his urinary incontinence is 100% improved.    Integumentary Integumentary Symptoms Reported: Wound Additional Integumentary Details: patient states his right foot wound continues to heal well. He states the home health nurse was concerned about how the wound was looking at his dressing change last week. He states he went to the doctor and was prescribed doxycycline  which he continues to take at this time.  Patient reports having a follow up visit with the podiatrist on 01/06/24 had it debrided and wound looked much better. He states the home health nurse continues to come out and change his dressing at leaat 2 x per week and his wife changes it on the other days. Skin Management Strategies: Medication therapy, Routine screening, Dressing changes  Musculoskeletal Musculoskelatal Symptoms Reviewed: Unsteady gait Musculoskeletal Management Strategies: Medical device  Psychosocial Psychosocial Symptoms Reported: Depression - if selected complete PHQ 2-9 Additional Psychological Details: patient denies any change in depression symptoms and is aware that he has an appointment with the social worker 01/28/24         There were no vitals filed for this visit.  Medications Reviewed Today     Reviewed by Reshma Hoey E, RN (Registered Nurse) on 01/08/24 at 1457   Med List Status: <None>   Medication Order Taking? Sig Documenting Provider Last Dose Status Informant  acetaminophen  (TYLENOL ) 500 MG tablet [id number] Yes Take 500 mg by mouth every 6 (six) hours as needed. [provider]  Active Self  aspirin  EC 81 MG tablet [id number] Yes Take 4 tablets (325 mg total) by mouth daily. Wouk, Devaughn Sayres, MD  Active   B Complex Vitamins (BL VITAMIN B COMPLEX PO) [id number] Yes Take 1 tablet by mouth daily. [provider]  Active Self  calcitRIOL  (ROCALTROL ) 0.25 MCG capsule [id number] Yes Take 1 capsule (0.25 mcg total) by mouth daily. Gretel App, NP  Active   carvedilol  (COREG ) 6.25 MG tablet [id number] Yes TAKE 1 TABLET(6.25 MG) BY MOUTH TWICE DAILY WITH A MEAL Gretel App, NP  Active Self  Cholecalciferol  (VITAMIN D3) 50 MCG (2000 UT) TABS [id number] Yes Take 1 tablet by mouth daily. [provider]  Active Self  clove oil liquid [id number] Yes Apply 1 application topically as needed. [provider]  Active Self           Med Note GAYLENE DARVIN JINNY Austin Dec 20, 2023 11:59 PM) PRN  colchicine  0.6 MG tablet [id number] Yes Take 2 tablets by mouth now x 1 dose then take 1 tablet by mouth one hour later Lester, Kacy, NP  Active Self           Med Note (BEJOS, ANJA J   Sun Dec 20, 2023 11:59 PM) PRN  Continuous Glucose Receiver (FREESTYLE LIBRE 3 READER) DEVI [id number] Yes 1 Device by Does not apply route daily. Check levels once daily Gretel App, NP  Active   Continuous Glucose Sensor (FREESTYLE LIBRE 3 PLUS SENSOR) MISC [id number] Yes Change sensor every 15 days. Gretel App, NP  Active   cyanocobalamin  (VITAMIN B12) 1000 MCG/ML injection [id number] Yes Inject 1,000 mcg into the muscle every 30 (thirty) days. [provider]  Active Self  cyclobenzaprine  (FLEXERIL ) 10 MG tablet [id number] Yes Take 1 tablet (10 mg total) by mouth 3 (three) times daily as needed. Gretel App, NP  Active   dapagliflozin  propanediol ([name] )  10 MG TABS tablet [id number]  Take 1 tablet (10 mg total) by mouth daily.  Patient not taking: Reported on 01/05/2024   Gretel App, NP  Active Self  doxazosin  (CARDURA ) 4 MG tablet [id number] Yes Take 1 tablet (4 mg total) by mouth daily. Gretel App, NP  Active Self  doxycycline  (ADOXA) 100 MG tablet [id number] Yes Take 100 mg by mouth 2 (two) times daily. [provider]  Active   DULoxetine  (CYMBALTA ) 60 MG capsule [id number] Yes TAKE 1 CAPSULE(60 MG) BY MOUTH DAILY Maribeth Camellia MATSU, MD  Active Self  esomeprazole  (NEXIUM ) 40 MG capsule [id number] Yes Take 1 capsule (40 mg total) by mouth daily at 12 noon. Vannie Delon LABOR, MD  Active Self  furosemide  (LASIX ) 40 MG tablet [id number] Yes TAKE 1 TABLET(40 MG) BY MOUTH DAILY Gretel App, NP  Active Self  hydroxypropyl methylcellulose / hypromellose (ISOPTO TEARS / GONIOVISC) 2.5 % ophthalmic solution [id number] Yes 1  drop as needed for dry eyes. [provider]  Active Self           Med Note (BEJOS, ANJA J   Mon Dec 21, 2023 12:00 AM) PRN  insulin  degludec ([name]  FLEXTOUCH) 200 UNIT/ML FlexTouch Pen [id number] Yes Inject 40 Units into the skin daily. Gretel App, NP  Active            Med Note DORI, Kiran Carline E   Thu Dec 31, 2023  3:21 PM) Patient states he is taking 44 units daily.  Request clarification from primary care provider.   Insulin  Pen Needle (NOVOFINE PEN NEEDLE) 32G X 6 MM MISC [id number] Yes 1 each by Does not apply route 2 (two) times daily. Maribeth Camellia MATSU, MD  Active Self  levothyroxine  (SYNTHROID ) 100 MCG tablet [id number] Yes TAKE 1 TABLET(100 MCG) BY MOUTH DAILY BEFORE OFILIA Gretel App, NP  Active Self  loratadine  (CLARITIN ) 10 MG tablet [id number] Yes Take 10 mg by mouth daily as needed for allergies. [provider]  Active Self           Med Note (BEJOS, ANJA J   Mon Dec 21, 2023 12:00 AM) PRN  losartan  (COZAAR ) 25 MG tablet [id number] Yes TAKE 1 TABLET BY MOUTH EVERY MORNING THEN TAKE 2  TABLETS BY MOUTH EVERY KARNA Gretel App, NP  Active   Magnesium  250 MG TABS [id number] Yes Take 250 mg by mouth 2 (two) times daily. [provider]  Active Self  mometasone  (NASONEX ) 50 MCG/ACT nasal spray [id number] Yes Place 2 sprays into the nose daily. Maribeth Camellia MATSU, MD  Active Self  Multiple Vitamins-Minerals (MULTIVITAMIN MEN PO) [id number] Yes Take 1 tablet by mouth daily. [provider]  Active Self  Needle, Disp, (HYPODERMIC NEEDLE 18GX1) 18G X 1 MISC [id number] Yes Use every 14 days to draw up testosterone  [provider]  Active Self  Needle, Disp, (HYPODERMIC NEEDLE 25GX1) 25G X 1 MISC [id number] Yes Use every 14 days to administer testosterone  [provider]  Active Self  pravastatin  (PRAVACHOL ) 20 MG tablet [id number] Yes Take 1 tablet (20 mg total) by mouth daily. Gretel App, NP  Active Self           Med Note (BEJOS, ANJA J   Mon Dec 21, 2023 12:12 AM) Per office visit notes, ot was switched from simvastatin  to pravastatin  due to muscle cramps  pregabalin  (LYRICA ) 200 MG capsule [id number] Yes Take 1 capsule (200 mg total) by mouth 2 (two) times daily. Gretel App, NP  Active   Semaglutide , 2 MG/DOSE, 8 MG/3ML SOPN [id number] Yes Inject 2 mg as directed once a week. Gretel App, NP  Active   tadalafil (CIALIS) 20 MG tablet [id number] Yes Take 20 mg by mouth daily as needed. [provider]  Active Self  testosterone  cypionate (DEPOTESTOSTERONE CYPIONATE) 200 MG/ML injection [id number] Yes Inject 200 mg into the muscle every 14 (fourteen) days. [provider]  Active Self            Goals Addressed             This Visit's Progress    VBCI Transitions of Care (TOC) Care Plan       Problems:  Recent Hospitalization for treatment of cellulitis and abscess of right LE Knowledge Deficit Related to management of cellulitis/ abscess of right LE  Goal:  Over the next 30 days, the patient will not experience  hospital readmission  Interventions:  Transitions of Care: Reviewed Signs and  symptoms of infection Confirmed patient received clarification regarding his [name]  dose - patient states he received clarification on his dosage from his provider. He states he is to take 44 units daily.  Confirmed patient receiving ongoing home health  Assessed for Zio patch delivery- patient states he never received he zio patch. Patient advised to notify his cardiology office that he did not receive the Zio patch  Assess for symptoms/ healing of Right LE wound-  Reviewed upcoming provider visits Reminded patient that he has been referred to the social worker and to expect call.  Advised to take antibiotics until completed. Advised to call provider for any new symptoms or concerns.  Message sent to primary care provider of drug to drug notification interaction for doxycycline  Assessed right foot pain Confirmed patient had follow up visit with podiatrist.    Patient Self Care Activities:  Call pharmacy for medication refills 3-7 days in advance of running out of medications Call provider office for new concerns or questions  Take medications as prescribed   Continue wound care to right foot as recommended by podiatrist. Notify provider for signs of infection Notify your cardiologist that you did not receive the zio patch Continue to take antibiotics until completed Continue to change bandage to foot as recommended by podiatrist.    Plan:  Telephone follow up appointment with care management team member scheduled for:  01/15/24 at 1pm The patient has been provided with contact information for the care management team and has been advised to call with any health related questions or concerns.         Recommendation:   Continue Current Plan of Care  Follow Up Plan:   Telephone follow-up in 1 week  Arvin Seip RN, BSN, CCM [facility], Population Health Case  Manager Phone: [phone]

## 2024-01-11 ENCOUNTER — Ambulatory Visit: Payer: Self-pay | Admitting: Urology

## 2024-01-11 ENCOUNTER — Ambulatory Visit
Admission: RE | Admit: 2024-01-11 | Discharge: 2024-01-11 | Disposition: A | Discharge status: Home / Self Care | Source: Ambulatory Visit | Attending: Urology | Admitting: Urology

## 2024-01-11 DIAGNOSIS — N2889 Other specified disorders of kidney and ureter: Secondary | ICD-10-CM | POA: Diagnosis present

## 2024-01-11 MED ORDER — GADOBUTROL 1 MMOL/ML IV SOLN
10.0000 mL | Freq: Once | INTRAVENOUS | Status: AC | PRN
  Administered 2024-01-11: 10 mL via INTRAVENOUS

## 2024-01-12 ENCOUNTER — Other Ambulatory Visit

## 2024-01-12 NOTE — Telephone Encounter (Signed)
 Form faxed to [phone] with a completed transmission log

## 2024-01-12 NOTE — Progress Notes (Deleted)
01/12/2024 Name: Douglas [name] MRN: [id number] DOB: 1945-11-24  Subjective  No chief complaint on file.   Reason for visit: ?  Douglas [name] is a 78 y.o. male with a history of diabetes (type 2), who presents today for an initial diabetes pharmacotherapy visit.? Patient was referred by PCP for Diabetes management, medication access, adherence.  Pertinent PMH also includes HTN, OSA, hypothyroidism, secondary hyperparathyroidism, CKD, BPH, HLD, gout, GAD, MDD.  Known DM Complications: autonomic neuropathy (ED, peripheral neuropathy), current ulcer of R toe, nephropathy.  Care Team: Primary Care Provider: Gretel App, NP   Date of Last Diabetes Related Visit: with PCP on 12/31/23, with PharmD 01/05/24 9/23: Placed first libre sensor though [employer] reader still being filled at the pharmacy. CGM check-in ~1-2 weeks.   Medication Access/Adherence: Prescription drug coverage: Payor: [employer] / Plan: [employer] PPO / Product Type: *No Product type* / .  - Reports that all medications are affordable.  Notes one prescription is ~$40. Believeslevothyroxine .  - Current Patient Assistance: Novo (Ozempic , [name]  U200), [name] ([name] ) - Medication Adherence: Patient denies missing doses of their medication.    Since Last visit / History of Present Illness: ?  Reports doing well overall. Does not have compatible smart phone though recently was able to pick up the [place] reader at his pharmacy. Denies issues with connecting his first sensor ***.   Denies issues in recent months with PAP refills. Continues to receive [name] , [name] , Ozempic .   Reported DM Regimen: ?  [name]  U200 44 units once daily  Ozempic  2 mg weekly (Mondays)  SMBG: Libre 3+ (reader)  ?  Not set up yet. Plans to pick up reader from pharmacy tomorrow    Hypo/Hyperglycemia: ?  Symptoms of hypoglycemia since last visit:? no  Symptoms of hyperglycemia since last visit:? no   DM Prevention:   Statin: Taking; low intensity pravastatin .?(muscle symptoms on simva) History of chronic kidney disease? yes History of albuminuria? Yes - last UACR = 12/16/22 (outside facility) = 56 mg/g ACE/ARB - Taking losartan  25 mg; Urine MA/CR Ratio - elevated urinary albumin excretion.  Last eye exam: 2021; No retinopathy present - DUE Last foot exam: 04/01/2023 Tobacco Use: Never smoker  Immunizations:? Flu: Up to date (Last: 12/31/2023); Pneumococcal: PPSV23 (2012) PCV13 (2015) after age 47; Shingrix: No Record - DUE  Cardiovascular Risk Reduction History of clinical ASCVD? no The 10-year ASCVD risk score (Arnett DK, et al., 2019) is: 63.1% History of heart failure? no History of hyperlipidemia? yes Current BMI: 35.8 kg/m2 (Ht 71 in, Wt 116.4 kg) Taking statin? yes; low intensity (pravastatin  40) Taking aspirin ? Taking   Taking SGLT-2i? yes (held at present time) Taking GLP- 1 RA? yes      _______________________________________________  Objective    Review of Systems:? Limited in the setting of virtual visit  Constitutional:? No fever, chills or unintentional weight loss  GI:? No nausea, vomiting, constipation, diarrhea, abdominal pain, dyspepsia, change in bowel habits  Endocrine:? No polyuria, polyphagia or blurred vision    Physical Examination:  Vitals:  Wt Readings from Last 3 Encounters:  12/31/23 256 lb 9.6 oz (116.4 kg)  12/20/23 265 lb (120.2 kg)  12/03/23 265 lb (120.2 kg)   BP Readings from Last 3 Encounters:  12/31/23 124/80  12/24/23 128/68  12/03/23 110/82   Pulse Readings from Last 3 Encounters:  12/31/23 80  12/24/23 69  12/03/23 85    Labs:?  Lab Results  Component Value Date   HGBA1C 7.5 (H)  12/20/2023   HGBA1C 7.5 (H) 10/01/2023   HGBA1C 7.9 (H) 07/01/2023   GLUCOSE 119 (H) 12/24/2023   CREATININE 1.66 (H) 12/24/2023   CREATININE 1.61 (H) 12/23/2023   CREATININE 1.69 (H) 12/22/2023   GFR 46.99 (L) 10/01/2023   GFR 47.45 (L) 07/21/2023   GFR 42.15  (L) 04/01/2023    Lab Results  Component Value Date   CHOL 131 10/01/2023   LDLCALC 57 10/01/2023   LDLCALC 66 04/01/2023   LDLCALC 40 11/23/2019   LDLDIRECT 106.0 03/05/2022   LDLDIRECT 40.0 12/19/2020   HDL 38.00 (L) 10/01/2023   TRIG 181.0 (H) 10/01/2023   TRIG 249.0 (H) 04/01/2023   TRIG 261.0 (H) 03/05/2022   ALT 14 12/20/2023   ALT 25 10/01/2023   AST 22 12/20/2023   AST 23 10/01/2023      Chemistry      Component Value Date/Time   NA 138 12/24/2023 0506   NA 140 11/12/2018 1142   NA 142 12/04/2012 0414   K 3.8 12/24/2023 0506   K 4.4 12/04/2012 0414   CL 103 12/24/2023 0506   CL 110 (H) 12/04/2012 0414   CO2 27 12/24/2023 0506   CO2 26 12/04/2012 0414   BUN 22 12/24/2023 0506   BUN 13 11/12/2018 1142   BUN 32 (H) 12/04/2012 0414   CREATININE 1.66 (H) 12/24/2023 0506   CREATININE 1.30 (H) 11/17/2016 1012      Component Value Date/Time   CALCIUM 8.5 (L) 12/24/2023 0506   CALCIUM 9.1 12/04/2012 0414   ALKPHOS 74 12/20/2023 2048   ALKPHOS 64 12/02/2012 2139   AST 22 12/20/2023 2048   AST 81 (H) 12/02/2012 2139   ALT 14 12/20/2023 2048   ALT 74 12/02/2012 2139   BILITOT 3.1 (H) 12/20/2023 2048   BILITOT 0.6 11/12/2018 1142   BILITOT 0.5 12/02/2012 2139     The 10-year ASCVD risk score (Arnett DK, et al., 2019) is: 63.1%  Assessment and Plan:   1. Diabetes, type 2: uncontrolled per last A1c of 7.5% (12/20/23), unchanged from previous, 7.5%. Goal <7% without hypoglycemia. Newly prescribed Libre 3+ though awaiting reader to be filled at pharmacy. Not checking home sugars currently.  Current Regimen: [name]  U200 44u/d, Ozempic  2 mg/wk, [name]  (held) HCM: UACR overdue Continue medications today without changes.  CGM review 1 week s/p Libre start Reviewed s/sx/tx hypoglycemia Future Consideration: GLP1-RA: Novo reorder form faxed 9/16 per chart. Per automated system, refil is in process.  SGLT2i: held currently w active LE ulcer/cellulitis Metformin : renal  impairment   SU: Not unreasonable, though defer given alternative options with lower risk of hypoglycemia.    Follow Up Follow up with clinical pharmacist via phone in 1 week for new CGM review Patient given direct line for questions regarding medication therapy PCP 14-month f/u scheduled for Dec.   Future Appointments  Date Time Provider Department Center  01/12/2024  2:00 PM [facility] PHARMACIST [facility] [address]  01/13/2024 10:00 AM [facility] CLINICAL SUPPORT [facility] [address]  01/15/2024  1:00 PM Landy Arvin BRAVO, RN [facility] None  01/26/2024  2:45 PM Loistine Sober, NP [facility] None  01/28/2024 11:00 AM Land, Chrystal M, LCSW [facility] None  02/11/2024 11:00 AM Claudene Arthea HERO, DO [facility] None  02/17/2024 11:00 AM Twylla, Glendia BROCKS, MD [facility] None  03/31/2024 10:00 AM Gretel App, NP [facility] [address]  11/14/2024  1:00 PM [facility] ANNUAL WELLNESS VISIT [facility] [address] Manuel[address] FABIENE Geronimo, PharmD Clinical Pharmacist [facility]  [phone]  

## 2024-01-12 NOTE — Telephone Encounter (Signed)
 Forms faxed to [phone] with a completed transmission log

## 2024-01-13 ENCOUNTER — Ambulatory Visit (INDEPENDENT_AMBULATORY_CARE_PROVIDER_SITE_OTHER)

## 2024-01-13 DIAGNOSIS — E538 Deficiency of other specified B group vitamins: Secondary | ICD-10-CM | POA: Diagnosis not present

## 2024-01-13 MED ORDER — CYANOCOBALAMIN 1000 MCG/ML IJ SOLN
1000.0000 ug | Freq: Once | INTRAMUSCULAR | Status: AC
  Administered 2024-01-13: 1000 ug via INTRAMUSCULAR

## 2024-01-13 NOTE — Progress Notes (Signed)
 Patient was administered a B12 injection into his right deltoid. Patient tolerated the B12 injection well.

## 2024-01-14 ENCOUNTER — Encounter: Payer: Self-pay | Admitting: Nurse Practitioner

## 2024-01-14 DIAGNOSIS — I4891 Unspecified atrial fibrillation: Secondary | ICD-10-CM | POA: Insufficient documentation

## 2024-01-14 NOTE — Assessment & Plan Note (Signed)
 Intermittent atrial fibrillation was noted during hospitalization. A cardiac monitor is planned to assess AFib burden. He should wear the cardiac monitor for one month and follow up with cardiology next month.

## 2024-01-14 NOTE — Assessment & Plan Note (Signed)
 His type 2 diabetes is suboptimally controlled with an A1c of 7.5. He is on Ozempic  and [name] . Insurance issues with Freestyle Libre CGM have been resolved. Prescribe Freestyle Libre CGM and refer to a pharmacist for CGM management and potential medication adjustments. Continue Ozempic  and [name]  as prescribed. Encourage healthy diet.

## 2024-01-14 NOTE — Assessment & Plan Note (Signed)
 His essential hypertension showed elevated blood pressure in the hospital, likely related to pain. He is on losartan , carvedilol , and doxazosin . Blood pressure stable today in office. Continue current medication regimen.

## 2024-01-14 NOTE — Assessment & Plan Note (Addendum)
 New large ulcer on his right foot, previously treated with IV antibiotics, incision, drainage, and debridement, shows no signs of osteomyelitis. He is stable on oral antibiotics and home wound care. Continue oral antibiotics as prescribed and home health wound care three times a week. Follow up with podiatry weekly.

## 2024-01-14 NOTE — Assessment & Plan Note (Signed)
 Diabetic polyneuropathy is managed with Lyrica . Decrease Lyrica  to 200 mg due to side effects. PDMP reviewed. We will continue to monitor.

## 2024-01-15 ENCOUNTER — Telehealth: Payer: Self-pay

## 2024-01-18 ENCOUNTER — Telehealth: Payer: Self-pay

## 2024-01-18 NOTE — Patient Instructions (Signed)
 Visit Information  Thank you for taking time to visit with me today. Please don't hesitate to contact me if I can be of assistance to you before our next scheduled telephone appointment.  Our next appointment is by telephone on 01/27/24 at 10 am  Following is a copy of your care plan:   Goals Addressed             This Visit's Progress    VBCI Transitions of Care (TOC) Care Plan       Problems:  Recent Hospitalization for treatment of cellulitis and abscess of right LE Knowledge Deficit Related to management of cellulitis/ abscess of right LE  Goal:  Over the next 30 days, the patient will not experience hospital readmission  Interventions:  Transitions of Care: Reviewed Signs and symptoms of infection Confirmed patient receiving ongoing home health  Assessed for Zio patch delivery- - patient states zio patch never delivered. Patient states he spoke with the company for Zio patch and they never received the order. Patient states he called his cardiology office and they advised that the order was sent to the zio patch company on 9/11/2;5.  Patient states he will discuss this further with the cardiologist at his 01/16/24 appointment.  Assess for symptoms/ healing of Right LE wound-  Reviewed upcoming provider visits Assessed for depression symptom status.  Advised to take antibiotics until completed.  Patient reports completing his antibiotics Assessed right foot pain   Patient Self Care Activities:  Call pharmacy for medication refills 3-7 days in advance of running out of medications Call provider office for new concerns or questions  Take medications as prescribed   Continue wound care to right foot as recommended by podiatrist. Notify provider for signs of infection Keep follow up appointments with provider Notify provider for any new/ ongoing symptoms.    Plan:  Telephone follow up appointment with care management team member scheduled for:  01/27/24 at 10am         Patient verbalizes understanding of instructions and care plan provided today and agrees to view in MyChart. Active MyChart status and patient understanding of how to access instructions and care plan via MyChart confirmed with patient.     The patient has been provided with contact information for the care management team and has been advised to call with any health related questions or concerns.   Please call the care guide team at [phone] if you need to cancel or reschedule your appointment.   Please call the Suicide and Crisis Lifeline: 988 call the [place]  National Suicide Prevention Lifeline: 434-186-9576 or TTY: 438-659-0581 TTY 346-509-5722) to talk to a trained counselor call 1-800-273-TALK (toll free, 24 hour hotline) if you are experiencing a Mental Health or Behavioral Health Crisis or need someone to talk to.  Arvin Seip RN, BSN, CCM [employer], Population Health Case Manager Phone: [phone]

## 2024-01-18 NOTE — Transitions of Care (Post Inpatient/ED Visit) (Signed)
 Transition of Care week 4  Visit Note  01/18/2024  Name: Douglas [name] MRN: [id number]          DOB: Dec 10, 1945  Situation: Patient enrolled in [facility] 30-day program. Visit completed with patient by telephone.   Background: Hospitalized from 12/20/23 to 12/24/23 for Cellulitis and abscess of right lower extremity      Past Medical History:  Diagnosis Date   Arthritis    Depression    Diabetes mellitus with complication (HCC)    Diabetic neuropathy (HCC)    Diastolic dysfunction    a. TTE 07/2017: EF 60-65%, mild concentric LVH, no RWMA, Gr1DD, trivial AI, mildly dilated LA, RVSF normal, PASP normal   Edema    feet/legs   GERD (gastroesophageal reflux disease)    Gout    History of stress test    a. MV 06/2017: small in size, mild in severity, apical anterior and apical defect that was minimally reversible and most likely represented artifact and less likely ischemia/scar, LVEF 55-65%, low risk, probably normal stress test   Hypertension    Hypothyroidism    Kidney cysts    renal failure 2013   Kidney stones    Dr. Waddell   OSA (obstructive sleep apnea)    No oxygen at all in 2-3 years.   [place] spotted fever 12/10/2017   Positve IgG in titer on 11/20/17   S/P cardiac cath 1998   a. no obstructive disease   Syncope and collapse    Temporary low platelet count     Assessment: Patient Reported Symptoms: Cognitive Cognitive Status: No symptoms reported, Alert and oriented to person, place, and time, Insightful and able to interpret abstract concepts, Normal speech and language skills      Neurological Neurological Review of Symptoms: No symptoms reported    HEENT HEENT Symptoms Reported: No symptoms reported      Cardiovascular Cardiovascular Symptoms Reported: No symptoms reported Other Cardiovascular Symptoms: patient reports zio patch was never received. He states he has contacted the company for Zio patch as well has his cardiology office to notify them.  Patient reports having a follow up visit with his cardiologist on 01/26/24.    Respiratory Respiratory Symptoms Reported: No symptoms reported    Endocrine Endocrine Symptoms Reported: No symptoms reported Is patient diabetic?: Yes Is patient checking blood sugars at home?: Yes List most recent blood sugar readings, include date and time of day: Patient reports his last blood sugar was today 149 fasting.    Gastrointestinal Gastrointestinal Symptoms Reported: No symptoms reported      Genitourinary Genitourinary Symptoms Reported: No symptoms reported    Integumentary Additional Integumentary Details: patient reports wound to his right foot is healing and reports pain level of 0 in right foot. Patient reports having pain in his left foot that started yesterday evening and gradually got worse during the night. Patient reports his pain level in his left foot is an 8 and reports the pain is originating his his left big toe. He reports some redness and swelling in his left big toe. Patient states he is scheduled to see his podiatrist tomorrow.  Patient states he did take an aleve this morning which he feels is helping with the pain. Skin Management Strategies: Routine screening, Medication therapy, Medical device  Musculoskeletal Musculoskelatal Symptoms Reviewed: Unsteady gait Additional Musculoskeletal Details: reports ongoing uses of his walker        Psychosocial Psychosocial Symptoms Reported: Depression - if selected complete PHQ 2-9 Additional Psychological  Details: patient states his depression symptoms are getting better. He reports having an upcoming appointment with the social worker on 01/28/24. Patient states he was able to engage in social activity with his family recently that was very refreshing. Behavioral Management Strategies: Support system, Medication therapy       There were no vitals filed for this visit.  Medications Reviewed Today     Reviewed by Vignesh Willert E,  RN (Registered Nurse) on 01/18/24 at 1102  Med List Status: <None>   Medication Order Taking? Sig Documenting Provider Last Dose Status Informant  acetaminophen  (TYLENOL ) 500 MG tablet [id number]  Take 500 mg by mouth every 6 (six) hours as needed. [provider]  Active Self  aspirin  EC 81 MG tablet [id number]  Take 4 tablets (325 mg total) by mouth daily. Wouk, Devaughn Sayres, MD  Active   B Complex Vitamins (BL VITAMIN B COMPLEX PO) [id number]  Take 1 tablet by mouth daily. [provider]  Active Self  calcitRIOL  (ROCALTROL ) 0.25 MCG capsule [id number]  Take 1 capsule (0.25 mcg total) by mouth daily. Gretel App, NP  Active   carvedilol  (COREG ) 6.25 MG tablet [id number]  TAKE 1 TABLET(6.25 MG) BY MOUTH TWICE DAILY WITH A MEAL Gretel App, NP  Active Self  Cholecalciferol  (VITAMIN D3) 50 MCG (2000 UT) TABS [id number]  Take 1 tablet by mouth daily. [provider]  Active Self  clove oil liquid [id number]  Apply 1 application topically as needed. [provider]  Active Self           Med Note (BEJOS, ANJA J   Sun Dec 20, 2023 11:59 PM) PRN  colchicine  0.6 MG tablet [id number]  Take 2 tablets by mouth now x 1 dose then take 1 tablet by mouth one hour later Lester, Kacy, NP  Active Self           Med Note (BEJOS, ANJA J   Sun Dec 20, 2023 11:59 PM) PRN  Continuous Glucose Receiver (FREESTYLE LIBRE 3 READER) DEVI [id number]  1 Device by Does not apply route daily. Check levels once daily Gretel App, NP  Active   Continuous Glucose Sensor (FREESTYLE LIBRE 3 PLUS SENSOR) MISC [id number]  Change sensor every 15 days. Gretel App, NP  Active   cyanocobalamin  (VITAMIN B12) 1000 MCG/ML injection [id number]  Inject 1,000 mcg into the muscle every 30 (thirty) days. [provider]  Active Self  cyclobenzaprine  (FLEXERIL ) 10 MG tablet [id number]  Take 1 tablet (10 mg total) by mouth 3 (three) times daily as needed. Gretel App, NP  Active   dapagliflozin  propanediol  ([name] ) 10 MG TABS tablet [id number]  Take 1 tablet (10 mg total) by mouth daily.  Patient not taking: Reported on 01/05/2024   Gretel App, NP  Active Self  doxazosin  (CARDURA ) 4 MG tablet [id number]  Take 1 tablet (4 mg total) by mouth daily. Gretel App, NP  Active Self  doxycycline  (ADOXA) 100 MG tablet [id number]  Take 100 mg by mouth 2 (two) times daily.  Patient not taking: Reported on 01/18/2024   [provider]  Active   DULoxetine  (CYMBALTA ) 60 MG capsule [id number]  TAKE 1 CAPSULE(60 MG) BY MOUTH DAILY Maribeth Camellia MATSU, MD  Active Self  esomeprazole  (NEXIUM ) 40 MG capsule [id number]  Take 1 capsule (40 mg total) by mouth daily at 12 noon. Vannie Delon LABOR, MD  Active Self  furosemide  (LASIX ) 40 MG tablet [id number]  TAKE 1 TABLET(40 MG) BY MOUTH DAILY Lester, Kacy, NP  Active Self  hydroxypropyl methylcellulose / hypromellose (ISOPTO TEARS / GONIOVISC) 2.5 % ophthalmic solution [id number]  1 drop as needed for dry eyes. [provider]  Active Self           Med Note (BEJOS, ANJA J   Mon Dec 21, 2023 12:00 AM) PRN  insulin  degludec ([name]  FLEXTOUCH) 200 UNIT/ML FlexTouch Pen [id number]  Inject 40 Units into the skin daily. Gretel App, NP  Active            Med Note DORI, Felix Pratt E   Thu Dec 31, 2023  3:21 PM) Patient states he is taking 44 units daily.  Request clarification from primary care provider.   Insulin  Pen Needle (NOVOFINE PEN NEEDLE) 32G X 6 MM MISC [id number]  1 each by Does not apply route 2 (two) times daily. Maribeth Camellia MATSU, MD  Active Self  levothyroxine  (SYNTHROID ) 100 MCG tablet [id number]  TAKE 1 TABLET(100 MCG) BY MOUTH DAILY BEFORE OFILIA Gretel App, NP  Active Self  loratadine  (CLARITIN ) 10 MG tablet [id number]  Take 10 mg by mouth daily as needed for allergies. [provider]  Active Self           Med Note (BEJOS, ANJA J   Mon Dec 21, 2023 12:00 AM) PRN  losartan  (COZAAR ) 25 MG tablet [id number]  TAKE 1 TABLET BY MOUTH EVERY  MORNING THEN TAKE 2 TABLETS BY MOUTH EVERY KARNA Gretel App, NP  Active   Magnesium  250 MG TABS [id number]  Take 250 mg by mouth 2 (two) times daily. [provider]  Active Self  mometasone  (NASONEX ) 50 MCG/ACT nasal spray [id number]  Place 2 sprays into the nose daily. Maribeth Camellia MATSU, MD  Active Self  Multiple Vitamins-Minerals (MULTIVITAMIN MEN PO) [id number]  Take 1 tablet by mouth daily. [provider]  Active Self  Needle, Disp, (HYPODERMIC NEEDLE 18GX1) 18G X 1 MISC [id number]  Use every 14 days to draw up testosterone  [provider]  Active Self  Needle, Disp, (HYPODERMIC NEEDLE 25GX1) 25G X 1 MISC [id number]  Use every 14 days to administer testosterone  [provider]  Active Self  pravastatin  (PRAVACHOL ) 20 MG tablet [id number]  Take 1 tablet (20 mg total) by mouth daily. Gretel App, NP  Active Self           Med Note (BEJOS, ANJA J   Mon Dec 21, 2023 12:12 AM) Per office visit notes, ot was switched from simvastatin  to pravastatin  due to muscle cramps  pregabalin  (LYRICA ) 200 MG capsule [id number]  Take 1 capsule (200 mg total) by mouth 2 (two) times daily. Gretel App, NP  Active   Semaglutide , 2 MG/DOSE, 8 MG/3ML SOPN [id number]  Inject 2 mg as directed once a week. Gretel App, NP  Active   tadalafil (CIALIS) 20 MG tablet [id number]  Take 20 mg by mouth daily as needed. [provider]  Active Self  testosterone  cypionate (DEPOTESTOSTERONE CYPIONATE) 200 MG/ML injection [id number]  Inject 200 mg into the muscle every 14 (fourteen) days. [provider]  Active Self            Goals Addressed             This Visit's Progress    VBCI Transitions of Care (TOC) Care Plan       Problems:  Recent Hospitalization for treatment of cellulitis and abscess of right LE Knowledge Deficit Related to management of cellulitis/ abscess of right LE  Goal:  Over the next 30  days, the patient will not experience hospital  readmission  Interventions:  Transitions of Care: Reviewed Signs and symptoms of infection Confirmed patient receiving ongoing home health  Assessed for Zio patch delivery- - patient states zio patch never delivered. Patient states he spoke with the company for Zio patch and they never received the order. Patient states he called his cardiology office and they advised that the order was sent to the zio patch company on 9/11/2;5.  Patient states he will discuss this further with the cardiologist at his 01/16/24 appointment.  Assess for symptoms/ healing of Right LE wound-  Reviewed upcoming provider visits Assessed for depression symptom status.  Advised to take antibiotics until completed.  Patient reports completing his antibiotics Assessed right foot pain   Patient Self Care Activities:  Call pharmacy for medication refills 3-7 days in advance of running out of medications Call provider office for new concerns or questions  Take medications as prescribed   Continue wound care to right foot as recommended by podiatrist. Notify provider for signs of infection Keep follow up appointments with provider Notify provider for any new/ ongoing symptoms.    Plan:  Telephone follow up appointment with care management team member scheduled for:  01/27/24 at 10am        Recommendation:   Specialty provider follow-up with podiatrist due to pain, redness and swelling symptoms in left great toe Continue Current Plan of Care  Follow Up Plan:   Telephone follow-up in 1 week  Arvin Seip RN, BSN, CCM [place]  [facility], Population Health Case Manager Phone: [phone]

## 2024-01-19 NOTE — Telephone Encounter (Signed)
 Noted pt already received a reader

## 2024-01-19 NOTE — Telephone Encounter (Signed)
 Hey I did the prior auth on the [employer] 3 Reader and   This request was denied under your Medicare Part D benefit; however, coverage/payment for the requested CGM(s) is COVERED under Medicare Part B as long as your doctor continues to prescribe the drug for you, and it continues to be safe and effective.

## 2024-01-25 NOTE — Progress Notes (Deleted)
 Cardiology Clinic Note   Date: 01/25/2024 ID: Douglas LITTIE Stamps, DOB [birth year], MRN [id number]  Primary Cardiologist:  Deatrice Cage, MD  Chief Complaint   Douglas [name] is a 78 y.o. male who presents to the clinic today for ***  Patient Profile   Douglas [name] is followed by *** for the history outlined below.      Past medical history significant for: DOE. Nuclear stress test 07/01/2017: Normal, low risk study.  There is a small in size, mild in severity, apical anterior and apical defect that is minimally reversible and most likely represents artifact and less likely ischemia/scar. Orthostatic dizziness/syncope. Echo 07/17/2017: EF 60 to 65%.  Mild concentric LVH.  No RWMA.  Grade I DD.  Trivial AI.  Mild LAE.  Normal RV size/function.  Normal PA pressure. 14-day ZIO 08/13/2017: HR 64 to 190 bpm, average 84 bpm.  Short runs of SVT longest 11 beats.  Rare PACs/PVCs.  No A-fib. Hypertension. Hyperlipidemia. Lipid panel 04/01/2023: LDL 66, HDL 30, TG 249, total 146. OSA. GERD. T2DM. Hypothyroidism. CKD.  In summary, was first evaluated by Dr. Cage on 06/25/2017 for dizziness and syncope at the request of Dr. Maribeth.  He reported prior stress testing and cardiac catheterization in 1998 negative for obstructive disease.  He reported prolonged episodes of orthostatic dizziness and occasional syncope for many years.  It was felt symptoms were secondary to medications, particularly Cardura , and not arrhythmia.  He reported significant DOE and nuclear stress test was abnormal, low risk study.  Echo April 2019 showed normal LV/RV function as detailed above.  14-day ZIO negative for arrhythmias.   Patient was last seen in the office by me on 04/28/2023 for routine follow-up.  He was doing well at that time.  He reported chronic lower extremity edema unchanged from previous.  He had chronic dyspnea with more vigorous exertion and none with routine activities.  BP was  well-controlled at the time of his visit.  No changes were made.  Patient presented to the ED on 12/20/2023 with a wound on his right foot.  He reported redness, warmth and bruising to right foot.  He was found to be febrile and tachycardic.  X-ray of foot was negative for signs of osteomyelitis.  Initial labs: WBC 18.7, hemoglobin 12.5, hematocrit 38.6, sodium 133, potassium 4, BUN 24, creatinine 1.84.  He was admitted for right foot cellulitis.  During hospital admission patient was found to be in A-fib.  EKG demonstrated sinus rhythm with first-degree AV block.  No further A-fib noted on telemetry.  Order for outpatient monitor was placed.  Patient was discharged on 12/24/2023 to follow-up with podiatry, PCP as well as cardiology after completion of monitor.  Patient was not discharged on oral anticoagulation.  Outpatient monitoring does not appear to have been completed.     History of Present Illness    Today, patient ***  Possible A-fib A-fib seen on telemetry during hospital admission in early September 2025.  Patient*** -***  Orthostatic dizziness/syncope Patient reports gait instability secondary to bilateral neuropathy. No lightheadedness, dizziness, presyncope or syncope.***   Hypertension BP today***.  -Continue carvedilol , losartan .   Hyperlipidemia LDL December 2024 66, at goal. -Continue simvastatin . -Followed by PCP.  ROS: All other systems reviewed and are otherwise negative except as noted in History of Present Illness.  EKGs/Labs Reviewed        12/20/2023: ALT 14; AST 22 12/24/2023: BUN 22; Creatinine, Ser 1.66; Potassium 3.8; Sodium 138   12/24/2023:  Hemoglobin 11.0; WBC 6.0   12/23/2023: TSH 2.713   No results found for requested labs within last 365 days.  ***  Risk Assessment/Calculations    {Does this patient have ATRIAL FIBRILLATION?:[phone]} No BP recorded.  {Refresh Note OR Click here to enter BP  :1}***        Physical Exam    VS:  There were  no vitals taken for this visit. , BMI There is no height or weight on file to calculate BMI.  GEN: Well nourished, well developed, in no acute distress. Neck: No JVD or carotid bruits. Cardiac: *** RRR. *** No murmur. No rubs or gallops.   Respiratory:  Respirations regular and unlabored. Clear to auscultation without rales, wheezing or rhonchi. GI: Soft, nontender, nondistended. Extremities: Radials/DP/PT 2+ and equal bilaterally. No clubbing or cyanosis. No edema ***  Skin: Warm and dry, no rash. Neuro: Strength intact.  Assessment & Plan   ***  Disposition: ***     {Are you ordering a CV Procedure (e.g. stress test, cath, DCCV, TEE, etc)?   Press F2        :[id number]}   Signed, Barnie HERO. Jamyron Redd, DNP, NP-C

## 2024-01-26 ENCOUNTER — Telehealth: Payer: Self-pay

## 2024-01-26 ENCOUNTER — Ambulatory Visit: Attending: Student | Admitting: Student

## 2024-01-26 NOTE — Telephone Encounter (Signed)
 [employer] forms placed in provider to be signed folder

## 2024-01-27 ENCOUNTER — Telehealth: Payer: Self-pay

## 2024-01-27 NOTE — Patient Instructions (Signed)
 Visit Information  Thank you for taking time to visit with me today. You have completed the 30 day TOC program and your goals have been met.  Please contact your provider for any further needs.   Following is a copy of your care plan:   Goals Addressed             This Visit's Progress    VBCI Transitions of Care (TOC) Care Plan       Goals met.  TOC 30 day program completed.  Problems:  Recent Hospitalization for treatment of cellulitis and abscess of right LE Knowledge Deficit Related to management of cellulitis/ abscess of right LE  Goal:  Over the next 30 days, the patient will not experience hospital readmission  Interventions:  Transitions of Care: Reviewed Signs and symptoms of infection Confirmed patient receiving ongoing home health  Assessed for Zio patch delivery-  Assess for symptoms/ healing of Right LE wound-  Reviewed upcoming provider visits  Assessed right foot pain Advised patient to reschedule cardiology appointment Discussed and offered ongoing follow up with longitudinal nurse case manager-  Patient declined stating he  feels like he is able to manage his care at this time.    Patient Self Care Activities:  Call pharmacy for medication refills 3-7 days in advance of running out of medications Call provider office for new concerns or questions  Take medications as prescribed   Continue wound care to right foot as recommended by podiatrist. Notify provider for signs of infection Keep follow up appointments with provider-  Reschedule your cardiology appointment Notify provider for any new/ ongoing symptoms.    Plan:  No further follow up required: Goals have been met.  TOC 30 day program completed        Patient verbalizes understanding of instructions and care plan provided today and agrees to view in MyChart. Active MyChart status and patient understanding of how to access instructions and care plan via MyChart confirmed with patient.     No  further follow up required:    Please call the care guide team at [phone] if you need to cancel or reschedule your appointment.   Please call the Suicide and Crisis Lifeline: 988 call the [place]  National Suicide Prevention Lifeline: 910 688 8577 or TTY: 714-229-6499 TTY (903)651-7800) to talk to a trained counselor call 1-800-273-TALK (toll free, 24 hour hotline) if you are experiencing a Mental Health or Behavioral Health Crisis or need someone to talk to.  Arvin Seip RN, BSN, CCM [employer], Population Health Case Manager Phone: [phone]

## 2024-01-27 NOTE — Transitions of Care (Post Inpatient/ED Visit) (Signed)
 Transition of Care week #5  Visit Note  01/27/2024  Name: Douglas [name] MRN: [id number]          DOB: Jun 06, 1945  Situation: Patient enrolled in [facility] 30-day program. Visit completed with patient by telephone.   Background:   Hospitalized from 12/20/23 to 12/24/23 for Cellulitis and abscess of right lower extremity   Past Medical History:  Diagnosis Date   Arthritis    Depression    Diabetes mellitus with complication (HCC)    Diabetic neuropathy (HCC)    Diastolic dysfunction    a. TTE 07/2017: EF 60-65%, mild concentric LVH, no RWMA, Gr1DD, trivial AI, mildly dilated LA, RVSF normal, PASP normal   Edema    feet/legs   GERD (gastroesophageal reflux disease)    Gout    History of stress test    a. MV 06/2017: small in size, mild in severity, apical anterior and apical defect that was minimally reversible and most likely represented artifact and less likely ischemia/scar, LVEF 55-65%, low risk, probably normal stress test   Hypertension    Hypothyroidism    Kidney cysts    renal failure 2013   Kidney stones    Dr. Waddell   OSA (obstructive sleep apnea)    No oxygen at all in 2-3 years.   [place] spotted fever 12/10/2017   Positve IgG in titer on 11/20/17   S/P cardiac cath 1998   a. no obstructive disease   Syncope and collapse    Temporary low platelet count     Assessment: Patient Reported Symptoms: Cognitive Cognitive Status: Alert and oriented to person, place, and time, Insightful and able to interpret abstract concepts, Normal speech and language skills      Neurological Neurological Review of Symptoms: No symptoms reported    HEENT HEENT Symptoms Reported: No symptoms reported      Cardiovascular Cardiovascular Symptoms Reported: No symptoms reported Other Cardiovascular Symptoms: patient states he did not attend his cardiology appointment on 01/26/24 because he wasn't sure whether it was a phone call or not. Patient states he will call today to  reschedule    Respiratory Respiratory Symptoms Reported: No symptoms reported    Endocrine Endocrine Symptoms Reported: No symptoms reported    Gastrointestinal Gastrointestinal Symptoms Reported: No symptoms reported      Genitourinary Genitourinary Symptoms Reported: No symptoms reported    Integumentary Integumentary Symptoms Reported: Wound Additional Integumentary Details: Patient reports having recent follow up with his podiatrist.  He states his wound is healing well. He states he is now doing his dressing changes every other day.  Patient denies signs of infection at wound site Skin Management Strategies: Routine screening, Dressing changes  Musculoskeletal Musculoskelatal Symptoms Reviewed: Unsteady gait Musculoskeletal Management Strategies: Medical device      Psychosocial Psychosocial Symptoms Reported: No symptoms reported Additional Psychological Details: patient reminded of follow up appointment with social worker on 01/28/24         There were no vitals filed for this visit.  Medications Reviewed Today     Reviewed by Leetta Hendriks E, RN (Registered Nurse) on 01/27/24 at 1018  Med List Status: <None>   Medication Order Taking? Sig Documenting Provider Last Dose Status Informant  acetaminophen  (TYLENOL ) 500 MG tablet [id number] Yes Take 500 mg by mouth every 6 (six) hours as needed. [provider]  Active Self  aspirin  EC 81 MG tablet [id number] Yes Take 4 tablets (325 mg total) by mouth daily. Wouk, Devaughn Sayres, MD  Active  B Complex Vitamins (BL VITAMIN B COMPLEX PO) [id number] Yes Take 1 tablet by mouth daily. [provider]  Active Self  calcitRIOL  (ROCALTROL ) 0.25 MCG capsule [id number] Yes Take 1 capsule (0.25 mcg total) by mouth daily. Gretel App, NP  Active   carvedilol  (COREG ) 6.25 MG tablet [id number] Yes TAKE 1 TABLET(6.25 MG) BY MOUTH TWICE DAILY WITH A MEAL Gretel App, NP  Active Self  Cholecalciferol  (VITAMIN D3) 50 MCG (2000 UT)  TABS [id number] Yes Take 1 tablet by mouth daily. [provider]  Active Self  clove oil liquid [id number] Yes Apply 1 application topically as needed. [provider]  Active Self           Med Note (BEJOS, ANJA J   Sun Dec 20, 2023 11:59 PM) PRN  colchicine  0.6 MG tablet [id number] Yes Take 2 tablets by mouth now x 1 dose then take 1 tablet by mouth one hour later Lester, Kacy, NP  Active Self           Med Note (BEJOS, ANJA J   Sun Dec 20, 2023 11:59 PM) PRN  Continuous Glucose Receiver (FREESTYLE LIBRE 3 READER) DEVI [id number] Yes 1 Device by Does not apply route daily. Check levels once daily Gretel App, NP  Active   Continuous Glucose Sensor (FREESTYLE LIBRE 3 PLUS SENSOR) MISC [id number] Yes Change sensor every 15 days. Gretel App, NP  Active   cyanocobalamin  (VITAMIN B12) 1000 MCG/ML injection [id number] Yes Inject 1,000 mcg into the muscle every 30 (thirty) days. [provider]  Active Self  cyclobenzaprine  (FLEXERIL ) 10 MG tablet [id number] Yes Take 1 tablet (10 mg total) by mouth 3 (three) times daily as needed. Gretel App, NP  Active   dapagliflozin  propanediol ([name] ) 10 MG TABS tablet [id number]  Take 1 tablet (10 mg total) by mouth daily.  Patient not taking: Reported on 01/05/2024   Gretel App, NP  Active Self  doxazosin  (CARDURA ) 4 MG tablet [id number] Yes Take 1 tablet (4 mg total) by mouth daily. Gretel App, NP  Active Self  doxycycline  (ADOXA) 100 MG tablet [id number]  Take 100 mg by mouth 2 (two) times daily.  Patient not taking: Reported on 01/27/2024   [provider]  Active   DULoxetine  (CYMBALTA ) 60 MG capsule [id number] Yes TAKE 1 CAPSULE(60 MG) BY MOUTH DAILY Maribeth Camellia MATSU, MD  Active Self  esomeprazole  (NEXIUM ) 40 MG capsule [id number] Yes Take 1 capsule (40 mg total) by mouth daily at 12 noon. Vannie Delon LABOR, MD  Active Self  furosemide  (LASIX ) 40 MG tablet [id number] Yes TAKE 1 TABLET(40 MG) BY MOUTH DAILY Gretel App, NP   Active Self  hydroxypropyl methylcellulose / hypromellose (ISOPTO TEARS / GONIOVISC) 2.5 % ophthalmic solution [id number] Yes 1 drop as needed for dry eyes. [provider]  Active Self           Med Note (BEJOS, ANJA J   Mon Dec 21, 2023 12:00 AM) PRN  insulin  degludec ([name]  FLEXTOUCH) 200 UNIT/ML FlexTouch Pen [id number] Yes Inject 40 Units into the skin daily. Gretel App, NP  Active            Med Note DORI, Devantae Babe E   Thu Dec 31, 2023  3:21 PM) Patient states he is taking 44 units daily.  Request clarification from primary care provider.   Insulin  Pen Needle (NOVOFINE PEN NEEDLE) 32G X 6 MM MISC [id number] Yes 1 each by Does not apply route 2 (two) times daily. Maribeth Camellia MATSU,  MD  Active Self  levothyroxine  (SYNTHROID ) 100 MCG tablet [id number] Yes TAKE 1 TABLET(100 MCG) BY MOUTH DAILY BEFORE OFILIA Gretel App, NP  Active Self  loratadine  (CLARITIN ) 10 MG tablet [id number] Yes Take 10 mg by mouth daily as needed for allergies. [provider]  Active Self           Med Note (BEJOS, ANJA J   Mon Dec 21, 2023 12:00 AM) PRN  losartan  (COZAAR ) 25 MG tablet [id number] Yes TAKE 1 TABLET BY MOUTH EVERY MORNING THEN TAKE 2 TABLETS BY MOUTH EVERY KARNA Gretel App, NP  Active   Magnesium  250 MG TABS [id number] Yes Take 250 mg by mouth 2 (two) times daily. [provider]  Active Self  mometasone  (NASONEX ) 50 MCG/ACT nasal spray [id number] Yes Place 2 sprays into the nose daily. Maribeth Camellia MATSU, MD  Active Self  Multiple Vitamins-Minerals (MULTIVITAMIN MEN PO) [id number] Yes Take 1 tablet by mouth daily. [provider]  Active Self  Needle, Disp, (HYPODERMIC NEEDLE 18GX1) 18G X 1 MISC [id number] Yes Use every 14 days to draw up testosterone  [provider]  Active Self  Needle, Disp, (HYPODERMIC NEEDLE 25GX1) 25G X 1 MISC [id number] Yes Use every 14 days to administer testosterone  [provider]  Active Self  pravastatin  (PRAVACHOL ) 20  MG tablet [id number] Yes Take 1 tablet (20 mg total) by mouth daily. Gretel App, NP  Active Self           Med Note (BEJOS, ANJA J   Mon Dec 21, 2023 12:12 AM) Per office visit notes, ot was switched from simvastatin  to pravastatin  due to muscle cramps  pregabalin  (LYRICA ) 200 MG capsule [id number] Yes Take 1 capsule (200 mg total) by mouth 2 (two) times daily. Gretel App, NP  Active   Semaglutide , 2 MG/DOSE, 8 MG/3ML SOPN [id number] Yes Inject 2 mg as directed once a week. Gretel App, NP  Active   tadalafil (CIALIS) 20 MG tablet [id number] Yes Take 20 mg by mouth daily as needed. [provider]  Active Self  testosterone  cypionate (DEPOTESTOSTERONE CYPIONATE) 200 MG/ML injection [id number] Yes Inject 200 mg into the muscle every 14 (fourteen) days. [provider]  Active Self            Recommendation:   Continue Current Plan of Care Continue ongoing follow up with providers  Follow Up Plan:   Closing From:  Transitions of Care Program goals met  Agatha Duplechain RN, BSN, CCM [place]  [facility], Population Health Case Manager Phone: [phone]

## 2024-01-28 ENCOUNTER — Other Ambulatory Visit: Payer: Self-pay | Admitting: *Deleted

## 2024-01-28 NOTE — Telephone Encounter (Signed)
 Form faxed [phone]

## 2024-01-29 NOTE — Patient Outreach (Signed)
 Complex Care Management   Visit Note  01/29/2024  Name:  Douglas [name] MRN: [id number] DOB: 1946-04-07  Situation: Referral received for Complex Care Management related to Mental/Behavioral Health diagnosis depression. Patient states that at this time he is coping well.  Mood has improved as his medical condition improves I obtained verbal consent from Patient.  Visit completed with Patient  on the phone. CSW to follow up in 2-3 weeks to assess for any community/mental health needs.  Background:   Past Medical History:  Diagnosis Date   Arthritis    Depression    Diabetes mellitus with complication (HCC)    Diabetic neuropathy (HCC)    Diastolic dysfunction    a. TTE 07/2017: EF 60-65%, mild concentric LVH, no RWMA, Gr1DD, trivial AI, mildly dilated LA, RVSF normal, PASP normal   Edema    feet/legs   GERD (gastroesophageal reflux disease)    Gout    History of stress test    a. MV 06/2017: small in size, mild in severity, apical anterior and apical defect that was minimally reversible and most likely represented artifact and less likely ischemia/scar, LVEF 55-65%, low risk, probably normal stress test   Hypertension    Hypothyroidism    Kidney cysts    renal failure 2013   Kidney stones    Dr. Waddell   OSA (obstructive sleep apnea)    No oxygen at all in 2-3 years.   [place] spotted fever 12/10/2017   Positve IgG in titer on 11/20/17   S/P cardiac cath 1998   a. no obstructive disease   Syncope and collapse    Temporary low platelet count     Assessment: Patient Reported Symptoms:  Cognitive Cognitive Status: Alert and oriented to person, place, and time, Insightful and able to interpret abstract concepts, Normal speech and language skills Cognitive/Intellectual Conditions Management [RPT]: None reported or documented in medical history or problem list      Neurological Neurological Review of Symptoms: No symptoms reported    HEENT HEENT Symptoms Reported: No  symptoms reported      Cardiovascular Cardiovascular Symptoms Reported: No symptoms reported    Respiratory Respiratory Symptoms Reported: No symptoms reported    Endocrine Endocrine Symptoms Reported: Other (diabetic nerve pain-takes cymbalta  to address nerve pain) Is patient diabetic?: Yes Is patient checking blood sugars at home?: Yes List most recent blood sugar readings, include date and time of day: Libre 3 189 01/28/24 11:12am 109 this morning    Gastrointestinal Gastrointestinal Symptoms Reported: No symptoms reported      Genitourinary Genitourinary Symptoms Reported: No symptoms reported    Integumentary Integumentary Symptoms Reported: Wound Additional Integumentary Details: Patient reports follow up with podiatrist-would healing well Skin Management Strategies: Routine screening  Musculoskeletal Musculoskelatal Symptoms Reviewed: Unsteady gait Additional Musculoskeletal Details: ongoing use of walker, working with new Podiatrist through [facility]-no pain in foot-last appointment 01/25/24 Musculoskeletal Management Strategies: Medical device Falls in the past year?: Yes Number of falls in past year: 2 or more Was there an injury with Fall?: No Fall Risk Category Calculator: 2 Patient Fall Risk Level: Moderate Fall Risk Patient at Risk for Falls Due to: Impaired balance/gait Fall risk Follow up: Falls prevention discussed  Psychosocial Psychosocial Symptoms Reported: No symptoms reported Additional Psychological Details: Patientt sates that mood has improved as his medical condtion improves-patient reports feeling much better-denies depressed mood-states trying his best to return to his normal activitries ie sitting in his shop, blowing leaves, mowing the lawn Behavioral Management  Strategies: Medication therapy, Support group Major Change/Loss/Stressor/Fears (CP): Medical condition, self Behaviors When Feeling Stressed/Fearful: watches youtube, takes care of dog,  active in church Techniques to [place] with Loss/Stress/Change: Diversional activities, Medication Quality of Family Relationships: supportive, involved Do you feel physically threatened by others?: No    01/29/2024    PHQ2-9 Depression Screening   Little interest or pleasure in doing things Not at all  Feeling down, depressed, or hopeless Not at all  PHQ-2 - Total Score 0  Trouble falling or staying asleep, or sleeping too much    Feeling tired or having little energy    Poor appetite or overeating     Feeling bad about yourself - or that you are a failure or have let yourself or your family down    Trouble concentrating on things, such as reading the newspaper or watching television    Moving or speaking so slowly that other people could have noticed.  Or the opposite - being so fidgety or restless that you have been moving around a lot more than usual    Thoughts that you would be better off dead, or hurting yourself in some way    PHQ2-9 Total Score    If you checked off any problems, how difficult have these problems made it for you to do your work, take care of things at home, or get along with other people    Depression Interventions/Treatment      There were no vitals filed for this visit.  Medications Reviewed Today     Reviewed by Ermalinda Lenn HERO, LCSW (Social Worker) on 01/28/24 at 1116  Med List Status: <None>   Medication Order Taking? Sig Documenting Provider Last Dose Status Informant  acetaminophen  (TYLENOL ) 500 MG tablet [id number] Yes Take 500 mg by mouth every 6 (six) hours as needed. [provider]  Active Self  aspirin  EC 81 MG tablet [id number] Yes Take 4 tablets (325 mg total) by mouth daily. Wouk, Devaughn Sayres, MD  Active   B Complex Vitamins (BL VITAMIN B COMPLEX PO) [id number] Yes Take 1 tablet by mouth daily. [provider]  Active Self  calcitRIOL  (ROCALTROL ) 0.25 MCG capsule [id number] Yes Take 1 capsule (0.25 mcg total) by mouth daily.  Gretel App, NP  Active   carvedilol  (COREG ) 6.25 MG tablet [id number] Yes TAKE 1 TABLET(6.25 MG) BY MOUTH TWICE DAILY WITH A MEAL Gretel App, NP  Active Self  Cholecalciferol  (VITAMIN D3) 50 MCG (2000 UT) TABS [id number] Yes Take 1 tablet by mouth daily. [provider]  Active Self  clove oil liquid [id number] Yes Apply 1 application topically as needed. [provider]  Active Self           Med Note (BEJOS, ANJA J   Sun Dec 20, 2023 11:59 PM) PRN  colchicine  0.6 MG tablet [id number] Yes Take 2 tablets by mouth now x 1 dose then take 1 tablet by mouth one hour later Lester, Kacy, NP  Active Self           Med Note (BEJOS, ANJA J   Sun Dec 20, 2023 11:59 PM) PRN  Continuous Glucose Receiver (FREESTYLE LIBRE 3 READER) DEVI [id number] Yes 1 Device by Does not apply route daily. Check levels once daily Gretel App, NP  Active   Continuous Glucose Sensor (FREESTYLE LIBRE 3 PLUS SENSOR) MISC [id number] Yes Change sensor every 15 days. Gretel App, NP  Active   cyanocobalamin  (VITAMIN B12) 1000 MCG/ML injection [id number] Yes Inject 1,000 mcg  into the muscle every 30 (thirty) days. [provider]  Active Self  cyclobenzaprine  (FLEXERIL ) 10 MG tablet [id number] Yes Take 1 tablet (10 mg total) by mouth 3 (three) times daily as needed. Gretel App, NP  Active   dapagliflozin  propanediol ([name] ) 10 MG TABS tablet [id number]  Take 1 tablet (10 mg total) by mouth daily.  Patient not taking: Reported on 01/28/2024   Gretel App, NP  Active Self  doxazosin  (CARDURA ) 4 MG tablet [id number] Yes Take 1 tablet (4 mg total) by mouth daily. Gretel App, NP  Active Self  doxycycline  (ADOXA) 100 MG tablet [id number]  Take 100 mg by mouth 2 (two) times daily.  Patient not taking: Reported on 01/28/2024   [provider]  Active   DULoxetine  (CYMBALTA ) 60 MG capsule [id number] Yes TAKE 1 CAPSULE(60 MG) BY MOUTH DAILY Maribeth Camellia MATSU, MD  Active Self  esomeprazole  (NEXIUM ) 40 MG  capsule [id number] Yes Take 1 capsule (40 mg total) by mouth daily at 12 noon. Vannie Delon LABOR, MD  Active Self  furosemide  (LASIX ) 40 MG tablet [id number] Yes TAKE 1 TABLET(40 MG) BY MOUTH DAILY Gretel App, NP  Active Self  hydroxypropyl methylcellulose / hypromellose (ISOPTO TEARS / GONIOVISC) 2.5 % ophthalmic solution [id number] Yes 1 drop as needed for dry eyes. [provider]  Active Self           Med Note (BEJOS, ANJA J   Mon Dec 21, 2023 12:00 AM) PRN  insulin  degludec ([name]  FLEXTOUCH) 200 UNIT/ML FlexTouch Pen [id number] Yes Inject 40 Units into the skin daily. Gretel App, NP  Active            Med Note DORI, DAVINA E   Thu Dec 31, 2023  3:21 PM) Patient states he is taking 44 units daily.  Request clarification from primary care provider.   Insulin  Pen Needle (NOVOFINE PEN NEEDLE) 32G X 6 MM MISC [id number] Yes 1 each by Does not apply route 2 (two) times daily. Maribeth Camellia MATSU, MD  Active Self  levothyroxine  (SYNTHROID ) 100 MCG tablet [id number] Yes TAKE 1 TABLET(100 MCG) BY MOUTH DAILY BEFORE OFILIA Gretel App, NP  Active Self  loratadine  (CLARITIN ) 10 MG tablet [id number] Yes Take 10 mg by mouth daily as needed for allergies. [provider]  Active Self           Med Note (BEJOS, ANJA J   Mon Dec 21, 2023 12:00 AM) PRN  losartan  (COZAAR ) 25 MG tablet [id number] Yes TAKE 1 TABLET BY MOUTH EVERY MORNING THEN TAKE 2 TABLETS BY MOUTH EVERY KARNA Gretel App, NP  Active   Magnesium  250 MG TABS [id number] Yes Take 250 mg by mouth 2 (two) times daily. [provider]  Active Self  mometasone  (NASONEX ) 50 MCG/ACT nasal spray [id number] Yes Place 2 sprays into the nose daily. Maribeth Camellia MATSU, MD  Active Self  Multiple Vitamins-Minerals (MULTIVITAMIN MEN PO) [id number] Yes Take 1 tablet by mouth daily. [provider]  Active Self  Needle, Disp, (HYPODERMIC NEEDLE 18GX1) 18G X 1 MISC [id number] Yes Use every 14 days to draw up testosterone   [provider]  Active Self  Needle, Disp, (HYPODERMIC NEEDLE 25GX1) 25G X 1 MISC [id number] Yes Use every 14 days to administer testosterone  [provider]  Active Self  pravastatin  (PRAVACHOL ) 20 MG tablet [id number] Yes Take 1 tablet (20 mg total) by mouth daily. Gretel App, NP  Active Self           Med  Note GAYLENE DARVIN PARAS   Mon Dec 21, 2023 12:12 AM) Per office visit notes, ot was switched from simvastatin  to pravastatin  due to muscle cramps  pregabalin  (LYRICA ) 200 MG capsule [id number] Yes Take 1 capsule (200 mg total) by mouth 2 (two) times daily. Gretel App, NP  Active   Semaglutide , 2 MG/DOSE, 8 MG/3ML SOPN [id number] Yes Inject 2 mg as directed once a week. Gretel App, NP  Active   tadalafil (CIALIS) 20 MG tablet [id number] Yes Take 20 mg by mouth daily as needed. [provider]  Active Self  testosterone  cypionate (DEPOTESTOSTERONE CYPIONATE) 200 MG/ML injection [id number] Yes Inject 200 mg into the muscle every 14 (fourteen) days. [provider]  Active Self            Recommendation:   PCP Follow-up Specialty provider follow-up as scheduled  Follow Up Plan:   Telephone follow up appointment date/time:  02/24/24 11am  Hydee Fleece, LCSW [place]  [facility], [facility] Licensed Clinical Social Worker  Direct Dial: [phone]

## 2024-01-29 NOTE — Patient Instructions (Signed)
 Visit Information  Thank you for taking time to visit with me today. Please don't hesitate to contact me if I can be of assistance to you before our next scheduled appointment.  Our next appointment is by telephone on 02/24/24 at 1pm Please call the care guide team at [phone] if you need to cancel or reschedule your appointment.     Please call the Suicide and Crisis Lifeline: 988 call the [place]  National Suicide Prevention Lifeline: 309-294-6964 or TTY: 248 484 1696 TTY [phone]) to talk to a trained counselor call 1-800-273-TALK (toll free, 24 hour hotline) call 911 if you are experiencing a Mental Health or Behavioral Health Crisis or need someone to talk to.  The patient verbalized understanding of instructions, educational materials, and care plan provided today and DECLINED offer to receive copy of patient instructions, educational materials, and care plan.   Yousif Edelson, LCSW [place]  [facility], [facility] Licensed Clinical Social Worker  Direct Dial: [phone]

## 2024-02-02 ENCOUNTER — Telehealth: Payer: Self-pay

## 2024-02-02 NOTE — Telephone Encounter (Signed)
 Wound car forms placed in provider to be signed folder

## 2024-02-09 NOTE — Progress Notes (Unsigned)
 [name] [address] [place] [address] Phone: [phone] Subjective:   Douglas [name], am serving as a scribe for Dr. Arthea Claudene.  I'm seeing this patient by the request  of:  Douglas App, NP  CC: Multiple joint complaints  YEP:Dlagzrupcz  12/03/2023 No severe arthritic changes noted.  Responded well to the injection.  Hopeful that this will make significant improvement.  Discussed icing regimen.  Discussed which activities to do and which ones to avoid.  Follow-up again in 6 to 8 weeks otherwise     Bilateral injections given.  He did have some mild aspiration noted on the left knee and will send for further evaluation of the fluid.  Discussed icing regimen and home exercises, discussed which activities to do and which ones to avoid.  Increase activity slowly.  Discussed icing regimen.  Follow-up with me again 10 to 12 weeks.    Update 02/11/2024 Douglas [name] is a 78 y.o. male coming in with complaint of B knee and L shoulder pain. Patient states that he was in hospital due to infection in the R foot.   Knees are painful and he would like injections today.   Less pain in L shoulder today.   Continued pain in R ankle throughout the entire joint. MRI on 12/24/2023. Also has swelling in L ankle from time to time. Feels like he supinates foot when walking.       Past Medical History:  Diagnosis Date   Arthritis    Depression    Diabetes mellitus with complication (HCC)    Diabetic neuropathy (HCC)    Diastolic dysfunction    a. TTE 07/2017: EF 60-65%, mild concentric LVH, no RWMA, Gr1DD, trivial AI, mildly dilated LA, RVSF normal, PASP normal   Edema    feet/legs   GERD (gastroesophageal reflux disease)    Gout    History of stress test    a. MV 06/2017: small in size, mild in severity, apical anterior and apical defect that was minimally reversible and most likely represented artifact and less likely ischemia/scar, LVEF  55-65%, low risk, probably normal stress test   Hypertension    Hypothyroidism    Kidney cysts    renal failure 2013   Kidney stones    Dr. Waddell   OSA (obstructive sleep apnea)    No oxygen at all in 2-3 years.   [place] spotted fever 12/10/2017   Positve IgG in titer on 11/20/17   S/P cardiac cath 1998   a. no obstructive disease   Syncope and collapse    Temporary low platelet count    Past Surgical History:  Procedure Laterality Date   ANAL FISSURE REPAIR     BACK SURGERY  1977   rupture disc lumbar spine   CARDIAC CATHETERIZATION  1998   [facility].    CATARACT EXTRACTION W/PHACO Right 07/16/2016   Procedure: CATARACT EXTRACTION PHACO AND INTRAOCULAR LENS PLACEMENT (IOC);  Surgeon: Douglas Dingeldein, MD;  Location: [facility] ORS;  Service: Ophthalmology;  Laterality: Right;  US  01:11 AP% 17.6 CDE 24.83 fluid pack lot # [id number]   CATARACT EXTRACTION W/PHACO Left 08/13/2016   Procedure: CATARACT EXTRACTION PHACO AND INTRAOCULAR LENS PLACEMENT (IOC) suture placed in left eye at end of procedure;  Surgeon: Douglas Dingeldein, MD;  Location: [facility] ORS;  Service: Ophthalmology;  Laterality: Left;  US  01:55 AP% 22.7 CDE 53.56 fluid pack lot # [id number]   FINGER AMPUTATION  partial   FINGER ARTHROPLASTY     KNEE SURGERY  1993   arthroscopy   SHOULDER SURGERY Bilateral 1998   arthroscopic right, rotator cuff repair left   Social History   Socioeconomic History   Marital status: Married    Spouse name: Douglas [name]    Number of children: 3   Years of education: 13   Highest education level: Not on file  Occupational History    Comment: retired  Tobacco Use   Smoking status: Never   Smokeless tobacco: Never  Vaping Use   Vaping status: Never Used  Substance and Sexual Activity   Alcohol  use: Not Currently   Drug use: No   Sexual activity: Yes  Other Topics Concern   Not on file  Social History Narrative   Lives in [place]  with his wife (Douglas [name] ). No children. Three step children. No pets.      Work - Patient is retired. Maintenance supervisor.      School - One year college education.      Right handed.      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               Social Drivers of Health   Financial Resource Strain: High Risk (12/29/2023)   Received from [facility]   Overall Financial Resource Strain (CARDIA)    Difficulty of Paying Living Expenses: Hard  Food Insecurity: No Food Insecurity (01/28/2024)   Hunger Vital Sign    Worried About Running Out of Food in the Last Year: Never true    Ran Out of Food in the Last Year: Never true  Transportation Needs: No Transportation Needs (01/28/2024)   PRAPARE - Administrator, Civil Service (Medical): No    Lack of Transportation (Non-Medical): No  Physical Activity: Inactive (11/09/2023)   Exercise Vital Sign    Days of Exercise per Week: 0 days    Minutes of Exercise per Session: 0 min  Stress: No Stress Concern Present (11/09/2023)   [employer] of Occupational Health - Occupational Stress Questionnaire    Feeling of Stress: Not at all  Social Connections: Moderately Integrated (12/21/2023)   Social Connection and Isolation Panel    Frequency of Communication with Friends and Family: More than three times a week    Frequency of Social Gatherings with Friends and Family: More than three times a week    Attends Religious Services: More than 4 times per year    Active Member of [employer] or Organizations: No    Attends Banker Meetings: Never    Marital Status: Married   Allergies  Allergen Reactions   Bee Venom Swelling   Benadryl [Diphenhydramine Hcl (Sleep)] Other (See Comments)    Hyperactivity    Enalapril Itching    itching   Gabapentin Palpitations and Rash    rash   Hctz [Hydrochlorothiazide] Other (See Comments)    Decreased potassium   Omeprazole Other (See Comments)     Other reaction(s): increased sx on high dose   Family History  Problem Relation Age of Onset   Breast cancer Mother    Lung cancer Mother    Bone cancer Mother    Heart Problems Mother    Cancer Mother        breast, lung and rib   AAA (abdominal aortic aneurysm) Mother    Heart disease Mother    Heart attack Father    Heart disease Father  Cancer Brother        esophageal   Heart attack Brother    Colon cancer Neg Hx    Liver disease Neg Hx     Current Outpatient Medications (Endocrine & Metabolic):    calcitRIOL  (ROCALTROL ) 0.25 MCG capsule, Take 1 capsule (0.25 mcg total) by mouth daily.   [Paused] dapagliflozin  propanediol ([name] ) 10 MG TABS tablet, Take 1 tablet (10 mg total) by mouth daily.   insulin  degludec ([name]  FLEXTOUCH) 200 UNIT/ML FlexTouch Pen, Inject 40 Units into the skin daily.   levothyroxine  (SYNTHROID ) 100 MCG tablet, TAKE 1 TABLET(100 MCG) BY MOUTH DAILY BEFORE BREAKFAST   Semaglutide , 2 MG/DOSE, 8 MG/3ML SOPN, Inject 2 mg as directed once a week.   testosterone  cypionate (DEPOTESTOSTERONE CYPIONATE) 200 MG/ML injection, Inject 200 mg into the muscle every 14 (fourteen) days.  Current Outpatient Medications (Cardiovascular):    carvedilol  (COREG ) 6.25 MG tablet, TAKE 1 TABLET(6.25 MG) BY MOUTH TWICE DAILY WITH A MEAL   doxazosin  (CARDURA ) 4 MG tablet, Take 1 tablet (4 mg total) by mouth daily.   furosemide  (LASIX ) 40 MG tablet, TAKE 1 TABLET(40 MG) BY MOUTH DAILY   losartan  (COZAAR ) 25 MG tablet, TAKE 1 TABLET BY MOUTH EVERY MORNING THEN TAKE 2 TABLETS BY MOUTH EVERY EVENING   pravastatin  (PRAVACHOL ) 20 MG tablet, Take 1 tablet (20 mg total) by mouth daily.   tadalafil (CIALIS) 20 MG tablet, Take 20 mg by mouth daily as needed.  Current Outpatient Medications (Respiratory):    loratadine  (CLARITIN ) 10 MG tablet, Take 10 mg by mouth daily as needed for allergies.   mometasone  (NASONEX ) 50 MCG/ACT nasal spray, Place 2 sprays into the nose  daily.  Current Outpatient Medications (Analgesics):    acetaminophen  (TYLENOL ) 500 MG tablet, Take 500 mg by mouth every 6 (six) hours as needed.   aspirin  EC 81 MG tablet, Take 4 tablets (325 mg total) by mouth daily.   colchicine  0.6 MG tablet, Take 2 tablets by mouth now x 1 dose then take 1 tablet by mouth one hour later  Current Outpatient Medications (Hematological):    cyanocobalamin  (VITAMIN B12) 1000 MCG/ML injection, Inject 1,000 mcg into the muscle every 30 (thirty) days.  Current Outpatient Medications (Other):    AMBULATORY NON FORMULARY MEDICATION, 1 Units by Other route once for 1 dose.   B Complex Vitamins (BL VITAMIN B COMPLEX PO), Take 1 tablet by mouth daily.   Cholecalciferol  (VITAMIN D3) 50 MCG (2000 UT) TABS, Take 1 tablet by mouth daily.   clove oil liquid, Apply 1 application topically as needed.   Continuous Glucose Receiver (FREESTYLE LIBRE 3 READER) DEVI, 1 Device by Does not apply route daily. Check levels once daily   Continuous Glucose Sensor (FREESTYLE LIBRE 3 PLUS SENSOR) MISC, Change sensor every 15 days.   cyclobenzaprine  (FLEXERIL ) 10 MG tablet, Take 1 tablet (10 mg total) by mouth 3 (three) times daily as needed.   doxycycline  (ADOXA) 100 MG tablet, Take 100 mg by mouth 2 (two) times daily.   DULoxetine  (CYMBALTA ) 60 MG capsule, TAKE 1 CAPSULE(60 MG) BY MOUTH DAILY   esomeprazole  (NEXIUM ) 40 MG capsule, Take 1 capsule (40 mg total) by mouth daily at 12 noon.   hydroxypropyl methylcellulose / hypromellose (ISOPTO TEARS / GONIOVISC) 2.5 % ophthalmic solution, 1 drop as needed for dry eyes.   Insulin  Pen Needle (NOVOFINE PEN NEEDLE) 32G X 6 MM MISC, 1 each by Does not apply route 2 (two) times daily.   Magnesium  250 MG TABS,  Take 250 mg by mouth 2 (two) times daily.   Multiple Vitamins-Minerals (MULTIVITAMIN MEN PO), Take 1 tablet by mouth daily.   Needle, Disp, (HYPODERMIC NEEDLE 18GX1) 18G X 1 MISC, Use every 14 days to draw up testosterone    Needle,  Disp, (HYPODERMIC NEEDLE 25GX1) 25G X 1 MISC, Use every 14 days to administer testosterone    pregabalin  (LYRICA ) 200 MG capsule, Take 1 capsule (200 mg total) by mouth 2 (two) times daily.   Reviewed prior external information including notes and imaging from  primary care provider As well as notes that were available from care everywhere and other healthcare systems.  Past medical history, social, surgical and family history all reviewed in electronic medical record.  No pertanent information unless stated regarding to the chief complaint.   Review of Systems:  No headache, visual changes, nausea, vomiting, diarrhea, constipation, dizziness, abdominal pain, skin rash, fevers, chills, night sweats, weight loss, swollen lymph nodes, body aches, joint swelling, chest pain, shortness of breath, mood changes. POSITIVE muscle aches  Objective  Blood pressure 118/76, pulse 77, height 5' 11 (1.803 m), weight 255 lb (115.7 kg), SpO2 95%.   General: No apparent distress alert and oriented x3 mood and affect normal, dressed appropriately.  HEENT: Pupils equal, extraocular movements intact  Respiratory: Patient's speak in full sentences and does not appear short of breath  Skin breakdown noted on the plantar aspect of the large toe on the left foot.  No sign of any infection..  No sign of any infectious etiology though noted at the moment.  The ulcer is on the plantar aspect of the foot right in the's crease at the interphalangeal joint.  Significant onychomycosis noted.  Bilateral knees do have trace edema and effusion noted.  Patient does have limited range of motion in all planes.  After informed written and verbal consent, patient was seated on exam table. Right knee was prepped with alcohol  swab and utilizing anterolateral approach, patient's right knee space was injected with 4:1  marcaine  0.5%: Depo-Medrol 40mg /dL. Patient tolerated the procedure well without immediate complications.  After  informed written and verbal consent, patient was seated on exam table. Left knee was prepped with alcohol  swab and utilizing anterolateral approach, patient's left knee space was injected with 4:1  marcaine  0.5%: Depo-Medrol 40mg /dL. Patient tolerated the procedure well without immediate complications.    Impression and Recommendations:    The above documentation has been reviewed and is accurate and complete Mickal Meno M Myles Mallicoat, DO

## 2024-02-10 ENCOUNTER — Telehealth: Payer: Self-pay

## 2024-02-10 NOTE — Telephone Encounter (Signed)
 Novo Nordisk Ozempic  Refill forms placed in provider to be signed folder

## 2024-02-11 ENCOUNTER — Other Ambulatory Visit: Payer: Self-pay

## 2024-02-11 ENCOUNTER — Encounter: Payer: Self-pay | Admitting: Family Medicine

## 2024-02-11 ENCOUNTER — Ambulatory Visit (INDEPENDENT_AMBULATORY_CARE_PROVIDER_SITE_OTHER): Admitting: Family Medicine

## 2024-02-11 VITALS — BP 118/76 | HR 77 | Ht 71.0 in | Wt 255.0 lb

## 2024-02-11 DIAGNOSIS — M25512 Pain in left shoulder: Secondary | ICD-10-CM | POA: Diagnosis not present

## 2024-02-11 DIAGNOSIS — M170 Bilateral primary osteoarthritis of knee: Secondary | ICD-10-CM

## 2024-02-11 DIAGNOSIS — G8929 Other chronic pain: Secondary | ICD-10-CM | POA: Diagnosis not present

## 2024-02-11 MED ORDER — METHYLPREDNISOLONE ACETATE 40 MG/ML IJ SUSP
40.0000 mg | Freq: Once | INTRAMUSCULAR | Status: AC
  Administered 2024-02-11: 40 mg via INTRAMUSCULAR

## 2024-02-11 MED ORDER — AMBULATORY NON FORMULARY MEDICATION: 1.0000 [IU] | Freq: Once | 0 refills | Status: AC

## 2024-02-11 NOTE — Telephone Encounter (Signed)
 Form faxed

## 2024-02-11 NOTE — Assessment & Plan Note (Signed)
 Bilateral injections given today.  Tolerated procedure well.  Hopeful that this will make significant difference for some time again.  Discussed can repeat again in 10 to 12 weeks if needed.  Patient is a diabetic and does have diabetic peripheral neuropathy that is concerning.  Does have some breakdown of the skin on the left big toe that needs to be monitored as well.  Being followed up still by podiatry and wound care for his contralateral foot.  Follow-up with me again for the knees in 10 to 12 weeks

## 2024-02-11 NOTE — Patient Instructions (Addendum)
 Injected both knees today Eucerin 2x a day for a toe Talk to podiatrist about wound care Rx for diabetic shoes See me in 10-12 weeks

## 2024-02-12 ENCOUNTER — Telehealth: Payer: Self-pay | Admitting: Family Medicine

## 2024-02-12 NOTE — Telephone Encounter (Signed)
 Kara at [facility] needs a little more info to get pt diabetic shoe request approved.  Can we call her at [phone]

## 2024-02-12 NOTE — Telephone Encounter (Signed)
 Faxed note to DME company.

## 2024-02-15 ENCOUNTER — Telehealth: Payer: Self-pay

## 2024-02-15 ENCOUNTER — Ambulatory Visit (INDEPENDENT_AMBULATORY_CARE_PROVIDER_SITE_OTHER)

## 2024-02-15 ENCOUNTER — Encounter: Payer: Self-pay | Admitting: Pharmacist

## 2024-02-15 ENCOUNTER — Other Ambulatory Visit: Payer: Self-pay | Admitting: Pharmacist

## 2024-02-15 DIAGNOSIS — E538 Deficiency of other specified B group vitamins: Secondary | ICD-10-CM | POA: Diagnosis not present

## 2024-02-15 DIAGNOSIS — E1142 Type 2 diabetes mellitus with diabetic polyneuropathy: Secondary | ICD-10-CM

## 2024-02-15 MED ORDER — CYANOCOBALAMIN 1000 MCG/ML IJ SOLN
1000.0000 ug | Freq: Once | INTRAMUSCULAR | Status: AC
  Administered 2024-02-15: 1000 ug via INTRAMUSCULAR

## 2024-02-15 NOTE — Progress Notes (Unsigned)
 Called patient, he is currently out of [name] , awaiting shipment from Nov[employer] PAP.   Conference called Nov[employer] program to receive 30-day free insulin  voucher:  [employer] Immediate Supply Insulin  voucher: Valid once per calendar year.  BIN: [id number] PCN: [id number] GRP: [id number] ID: [id number]  15mL = up to 50 units per day Patient currently using 44 units daily.  Discussed plan to continue 44 units daily as instructed though Rx will say 44-50 units to allow for future titrations if needed.   --------------------------------------------- Discussed Novo PAP changes for 2026 including removal of Ozempic  from the program.  Patient plans to stick with HTA.  Encouraged him to review all available plans on HTA including current vs. Diabetes/heart care to see which HTA plan would offer best overall benefit including [facility] vs [facility] providers vs brand-name diabetes/cardiology medication.

## 2024-02-15 NOTE — Progress Notes (Signed)
 Pt presented for their vitamin B12 injection. Pt was identified through two identifiers. Pt tolerated shot well in their left deltoid.

## 2024-02-15 NOTE — Progress Notes (Signed)
 Patient Assistance Program (PAP) Application   Manufacturer: Novo Nordisk  - REFILL Medication(s): [name]  U200 44 units daily, Novofine pen needle, Ozempic  2.0 mg  Next Steps: [x]    Application filled out and uploaded to [employer] for review/signature. CMA messaged  Upon PCP signature Application to be faxed to: Novo Nordisk   _______________________________________________ [employer] Immediate Supply If you are at risk of rationing your insulin  and have an immediate need, you may be eligible for a one-time offer for a free, short-term supply of Novo Nordisk insulin  (30 day supply).  If you are enrolled in a Medicare Part D prescription drug plan Please call [phone] to complete your enrollment.   Non-Medicare Patients: Complete the form below. [email or web address].[email or web address]

## 2024-02-15 NOTE — Telephone Encounter (Signed)
 Pt came in for a b12 injection and mentioned he is completely out of tresiba . We have recently received a refill form for his Ozempic  from [employer] but not his [name] . Pt takes 44 units of tresiba  not 40. Is there a blank form we can use to initiate a refill on the [name] ?

## 2024-02-16 ENCOUNTER — Encounter: Payer: Self-pay | Admitting: Pharmacist

## 2024-02-16 MED ORDER — TRESIBA FLEXTOUCH 200 UNIT/ML ~~LOC~~ SOPN
PEN_INJECTOR | SUBCUTANEOUS | 0 refills | Status: AC
  Filled 2024-02-17: qty 15, 30d supply, fill #0

## 2024-02-16 NOTE — Progress Notes (Signed)
 Chart Review Reason: Medication Access  Summary: Called [facility] pharmacy to confirm [name]  was filled/coupon billed.   Medication currently out of stock and has not been billed through the savings card. Is being billed through the $35/month card.   Provided card again to [facility] for the free voucher 30 day supply.   They report that the card is invalid  Test claim request via [facility] to see if there are more details on the rejection (e.g. voucher used already in current calendar year?).   (Patient notes trying a voucher previously though never actually used it as [facility] was not getting successful claim. No longer has those card #s.)   Douglas [name], PharmD Clinical Pharmacist [facility] [phone]

## 2024-02-17 ENCOUNTER — Ambulatory Visit: Payer: Self-pay | Admitting: Urology

## 2024-02-17 ENCOUNTER — Encounter: Payer: Self-pay | Admitting: Urology

## 2024-02-17 ENCOUNTER — Telehealth: Payer: Self-pay | Admitting: Pharmacist

## 2024-02-17 ENCOUNTER — Telehealth: Payer: Self-pay

## 2024-02-17 ENCOUNTER — Other Ambulatory Visit (HOSPITAL_COMMUNITY): Payer: Self-pay

## 2024-02-17 VITALS — BP 109/65 | HR 75 | Ht 71.0 in | Wt 253.0 lb

## 2024-02-17 DIAGNOSIS — N401 Enlarged prostate with lower urinary tract symptoms: Secondary | ICD-10-CM | POA: Diagnosis not present

## 2024-02-17 DIAGNOSIS — N2889 Other specified disorders of kidney and ureter: Secondary | ICD-10-CM | POA: Diagnosis not present

## 2024-02-17 NOTE — Progress Notes (Signed)
 02/17/2024 11:05 AM   Douglas [name] 1945/07/17 [id number]  Referring provider: Maribeth Camellia MATSU, MD [address] [place],  [place] [address]  Chief Complaint  Patient presents with   Results   Urologic history:  1. T1 renal mass CT angio chest/abdomen/pelvis with incidental enhancing 2.8 x 2.7 x 2.4 cm left upper pole mass 01/2019 MRI abdomen without contrast 03/13/2019 ordered by nephrology measured at 2.9 x 2.5 x 2.5 cm Elected surveillance RUS 07/2019 2.9 x 2.6 x 3.0   2.  BPH with lower urinary tract symptoms Combination tx doxazosin /finasteride    3.  Erectile dysfunction  HPI: Douglas [name] is a 78 y.o. male presents for annual follow-up.  Was hospitalized 12/20/2023 for cellulitis and abscess of the right lower extremity with cultures growing MRSA, pansensitive.  Initially treated with IV antibiotic therapy and discharged on oral antibiotics He states during that hospitalization he had increased urinary urgency with occasional episodes of urge incontinence though states his symptoms are improving Remains on doxazosin  and finasteride  Renal mass protocol MRI performed 01/11/2024 showed a stable 2.0 x 1.8 cm enhancing left renal mass anterior-superior aspect   PMH: Past Medical History:  Diagnosis Date   Arthritis    Depression    Diabetes mellitus with complication (HCC)    Diabetic neuropathy (HCC)    Diastolic dysfunction    a. TTE 07/2017: EF 60-65%, mild concentric LVH, no RWMA, Gr1DD, trivial AI, mildly dilated LA, RVSF normal, PASP normal   Edema    feet/legs   GERD (gastroesophageal reflux disease)    Gout    History of stress test    a. MV 06/2017: small in size, mild in severity, apical anterior and apical defect that was minimally reversible and most likely represented artifact and less likely ischemia/scar, LVEF 55-65%, low risk, probably normal stress test   Hypertension    Hypothyroidism    Kidney cysts    renal failure  2013   Kidney stones    Dr. Waddell   OSA (obstructive sleep apnea)    No oxygen at all in 2-3 years.   [place] spotted fever 12/10/2017   Positve IgG in titer on 11/20/17   S/P cardiac cath 1998   a. no obstructive disease   Syncope and collapse    Temporary low platelet count     Surgical History: Past Surgical History:  Procedure Laterality Date   ANAL FISSURE REPAIR     BACK SURGERY  1977   rupture disc lumbar spine   CARDIAC CATHETERIZATION  1998   [facility] with [facility].    CATARACT EXTRACTION W/PHACO Right 07/16/2016   Procedure: CATARACT EXTRACTION PHACO AND INTRAOCULAR LENS PLACEMENT (IOC);  Surgeon: Steven Dingeldein, MD;  Location: [facility] ORS;  Service: Ophthalmology;  Laterality: Right;  US  01:11 AP% 17.6 CDE 24.83 fluid pack lot # [id number]   CATARACT EXTRACTION W/PHACO Left 08/13/2016   Procedure: CATARACT EXTRACTION PHACO AND INTRAOCULAR LENS PLACEMENT (IOC) suture placed in left eye at end of procedure;  Surgeon: Steven Dingeldein, MD;  Location: [facility] ORS;  Service: Ophthalmology;  Laterality: Left;  US  01:55 AP% 22.7 CDE 53.56 fluid pack lot # [id number]   FINGER AMPUTATION     partial   FINGER ARTHROPLASTY     KNEE SURGERY  1993   arthroscopy   SHOULDER SURGERY Bilateral 1998   arthroscopic right, rotator cuff repair left    Home Medications:  Allergies as of 02/17/2024  Reactions   Bee Venom Swelling   Benadryl [diphenhydramine Hcl (sleep)] Other (See Comments)   Hyperactivity   Enalapril Itching   itching   Gabapentin Palpitations, Rash   rash   Hctz [hydrochlorothiazide] Other (See Comments)   Decreased potassium   Omeprazole Other (See Comments)   Other reaction(s): increased sx on high dose        Medication List        Accurate as of February 17, 2024 11:05 AM. If you have any questions, ask your nurse or doctor.          PAUSE taking these medications    dapagliflozin  propanediol 10 MG Tabs  tablet Wait to take this until your doctor or other care provider tells you to start again. Commonly known as: [name]  Take 1 tablet (10 mg total) by mouth daily.       TAKE these medications    acetaminophen  500 MG tablet Commonly known as: TYLENOL  Take 500 mg by mouth every 6 (six) hours as needed.   aspirin  EC 81 MG tablet Take 4 tablets (325 mg total) by mouth daily.   BL VITAMIN B COMPLEX PO Take 1 tablet by mouth daily.   calcitRIOL  0.25 MCG capsule Commonly known as: ROCALTROL  Take 1 capsule (0.25 mcg total) by mouth daily.   carvedilol  6.25 MG tablet Commonly known as: COREG  TAKE 1 TABLET(6.25 MG) BY MOUTH TWICE DAILY WITH A MEAL   clove oil liquid Apply 1 application topically as needed.   colchicine  0.6 MG tablet Take 2 tablets by mouth now x 1 dose then take 1 tablet by mouth one hour later   cyanocobalamin  1000 MCG/ML injection Commonly known as: VITAMIN B12 Inject 1,000 mcg into the muscle every 30 (thirty) days.   cyclobenzaprine  10 MG tablet Commonly known as: FLEXERIL  Take 1 tablet (10 mg total) by mouth 3 (three) times daily as needed.   doxazosin  4 MG tablet Commonly known as: CARDURA  Take 1 tablet (4 mg total) by mouth daily.   doxycycline  100 MG tablet Commonly known as: ADOXA Take 100 mg by mouth 2 (two) times daily.   DULoxetine  60 MG capsule Commonly known as: CYMBALTA  TAKE 1 CAPSULE(60 MG) BY MOUTH DAILY   esomeprazole  40 MG capsule Commonly known as: NexIUM  Take 1 capsule (40 mg total) by mouth daily at 12 noon.   FreeStyle Libre 3 Plus Sensor Misc Change sensor every 15 days.   FreeStyle Libre 3 Reader Devi 1 Device by Does not apply route daily. Check levels once daily   furosemide  40 MG tablet Commonly known as: LASIX  TAKE 1 TABLET(40 MG) BY MOUTH DAILY   hydroxypropyl methylcellulose / hypromellose 2.5 % ophthalmic solution Commonly known as: ISOPTO TEARS / GONIOVISC 1 drop as needed for dry eyes.   HYPODERMIC NEEDLE  18GX1 18G X 1 Misc Use every 14 days to draw up testosterone    HYPODERMIC NEEDLE 25GX1 25G X 1 Misc Use every 14 days to administer testosterone    levothyroxine  100 MCG tablet Commonly known as: SYNTHROID  TAKE 1 TABLET(100 MCG) BY MOUTH DAILY BEFORE BREAKFAST   loratadine  10 MG tablet Commonly known as: CLARITIN  Take 10 mg by mouth daily as needed for allergies.   losartan  25 MG tablet Commonly known as: COZAAR  TAKE 1 TABLET BY MOUTH EVERY MORNING THEN TAKE 2 TABLETS BY MOUTH EVERY EVENING   Magnesium  250 MG Tabs Take 250 mg by mouth 2 (two) times daily.   mometasone  50 MCG/ACT nasal spray Commonly known as: Nasonex  Place  2 sprays into the nose daily.   MULTIVITAMIN MEN PO Take 1 tablet by mouth daily.   Novofine Pen Needle 32G X 6 MM Misc Generic drug: Insulin  Pen Needle 1 each by Does not apply route 2 (two) times daily.   pravastatin  20 MG tablet Commonly known as: PRAVACHOL  Take 1 tablet (20 mg total) by mouth daily.   pregabalin  200 MG capsule Commonly known as: Lyrica  Take 1 capsule (200 mg total) by mouth 2 (two) times daily.   Semaglutide  (2 MG/DOSE) 8 MG/3ML Sopn Inject 2 mg as directed once a week.   tadalafil 20 MG tablet Commonly known as: CIALIS Take 20 mg by mouth daily as needed.   testosterone  cypionate 200 MG/ML injection Commonly known as: DEPOTESTOSTERONE CYPIONATE Inject 200 mg into the muscle every 14 (fourteen) days.   [name]  FlexTouch 200 UNIT/ML FlexTouch Pen Generic drug: insulin  degludec Inject 44-50 units sq once daily as instructed. Bill primaryBETHA ALLY [id number]; PCNBETHA BLOTTER; GRP: [id number]; ID: [id number]   Vitamin D3 50 MCG (2000 UT) Tabs Take 1 tablet by mouth daily.        Allergies:  Allergies  Allergen Reactions   Bee Venom Swelling   Benadryl [Diphenhydramine Hcl (Sleep)] Other (See Comments)    Hyperactivity    Enalapril Itching    itching   Gabapentin Palpitations and Rash    rash   Hctz  [Hydrochlorothiazide] Other (See Comments)    Decreased potassium   Omeprazole Other (See Comments)    Other reaction(s): increased sx on high dose    Family History: Family History  Problem Relation Age of Onset   Breast cancer Mother    Lung cancer Mother    Bone cancer Mother    Heart Problems Mother    Cancer Mother        breast, lung and rib   AAA (abdominal aortic aneurysm) Mother    Heart disease Mother    Heart attack Father    Heart disease Father    Cancer Brother        esophageal   Heart attack Brother    Colon cancer Neg Hx    Liver disease Neg Hx     Social History:  reports that he has never smoked. He has never used smokeless tobacco. He reports that he does not currently use alcohol . He reports that he does not use drugs.   Physical Exam: BP 109/65   Pulse 75   Ht 5' 11 (1.803 m)   Wt 253 lb (114.8 kg)   BMI 35.29 kg/m   Constitutional:  Alert, No acute distress. HEENT: [place] AT Respiratory: Normal respiratory effort, no increased work of breathing. Psychiatric: Normal mood and affect.   Pertinent Imaging: MRI images were personally reviewed and interpreted   Assessment & Plan:    1.  Left renal mass Remains stable in size.  We again discussed this most likely represents a small renal cell carcinoma and the chance of metastasis without further enlargement is a < 5% He desires to continue surveillance and will schedule a renal ultrasound in 1 year  2.  BPH with LUTS Continue doxazosin /finasteride  His storage related voiding symptoms are improving and he was instructed to call earlier for persistent/bothersome storage related voiding symptoms   Douglas JAYSON Barba, MD  [facility] [address] [place], [place] [address] [phone]

## 2024-02-17 NOTE — Telephone Encounter (Signed)
 Pharmacy Patient Advocate Encounter  Insurance verification completed.   The patient is insured through [employer] [facility] ADVANTAGE/RX ADVANCE   Ran test claim for [name] . Currently a quantity of 6ml is a 30 day supply and the co-pay is $35 .   This test claim was processed through [facility]- copay amounts may vary at other pharmacies due to pharmacy/plan contracts, or as the patient moves through the different stages of their insurance plan.

## 2024-02-17 NOTE — Progress Notes (Signed)
 Brief Telephone Documentation Reason for Call: Medication Access  Summary of Call: [facility] remains unable to process free voucher.  [facility] test claim successful the the eVoucher at $0..  Patient agreeable to refill x1 at [facility] pharmacy. Prefers home delivery as wife is going out of town tomorrow so transportation is limited.   Called [facility] to request transfer of Rx from [facility].  Requested to Set for Home Delivery.   Fasting BG remain well-controlled despite missed insulin  doses ~110s. PP sugars up to 250 though come down within 60-90 minutes per patient report.   Follow Up: Patient given direct line for further questions/concerns. Patient provided with phone number for [facility]. Instructed him to call them tomorrow morning if he has not heard from them (confirm address/payment).    Manuelita FABIENE Kobs, PharmD Clinical Pharmacist [facility] [phone]

## 2024-02-17 NOTE — Patient Instructions (Signed)
 Scheduling number: [phone]

## 2024-02-17 NOTE — Telephone Encounter (Signed)
 Form faxed to [employer] [phone]

## 2024-02-18 ENCOUNTER — Other Ambulatory Visit (HOSPITAL_COMMUNITY): Payer: Self-pay

## 2024-02-24 ENCOUNTER — Telehealth: Payer: Self-pay | Admitting: *Deleted

## 2024-02-24 ENCOUNTER — Encounter: Payer: Self-pay | Admitting: *Deleted

## 2024-02-24 NOTE — Patient Instructions (Signed)
 Douglas [name] - I am sorry I was unable to reach you today for our scheduled appointment. I work with Gretel App, NP and am calling to support your healthcare needs. Please contact me at [phone] at your earliest convenience. I look forward to speaking with you soon.   Thank you,    Edna Rede, LCSW   [facility], [facility] Licensed Clinical Social Worker  Direct Dial: [phone]

## 2024-02-25 ENCOUNTER — Telehealth: Payer: Self-pay

## 2024-02-25 NOTE — Telephone Encounter (Signed)
 Copied from CRM [id number]. Topic: Clinical - Home Health Verbal Orders >> Feb 25, 2024  8:00 AM Berneda FALCON wrote: Caller/Agency: MaKayla from [facility] Callback Number: [phone] (secured line) Service Requested: Skilled Nursing Frequency: Recert 2x a week for 4 weeks, then once a week for 3 weeks Any new concerns about the patient? No

## 2024-02-25 NOTE — Telephone Encounter (Signed)
 Verbal orders provided.

## 2024-02-26 ENCOUNTER — Telehealth: Payer: Self-pay

## 2024-02-26 NOTE — Telephone Encounter (Signed)
 Home health orders placed in provider folder

## 2024-02-29 ENCOUNTER — Telehealth: Payer: Self-pay

## 2024-02-29 ENCOUNTER — Telehealth: Payer: Self-pay | Admitting: *Deleted

## 2024-02-29 NOTE — Telephone Encounter (Signed)
 Patient notified that Patient Assistance Medication are in the office & are ready for pick up.   Medication: Ozempic  8mg /59ml  Quantity: 4 boxes  Lot# [id number]  Exp: 09/12/2026

## 2024-02-29 NOTE — Telephone Encounter (Signed)
 Medication picked up by [place] , spouse.

## 2024-02-29 NOTE — Telephone Encounter (Signed)
 [employer] forms placed in provider to be signed folder

## 2024-03-01 ENCOUNTER — Telehealth: Payer: Self-pay | Admitting: Family Medicine

## 2024-03-01 DIAGNOSIS — M7731 Calcaneal spur, right foot: Secondary | ICD-10-CM

## 2024-03-01 DIAGNOSIS — I1310 Hypertensive heart and chronic kidney disease without heart failure, with stage 1 through stage 4 chronic kidney disease, or unspecified chronic kidney disease: Secondary | ICD-10-CM | POA: Diagnosis not present

## 2024-03-01 DIAGNOSIS — L97511 Non-pressure chronic ulcer of other part of right foot limited to breakdown of skin: Secondary | ICD-10-CM | POA: Diagnosis not present

## 2024-03-01 DIAGNOSIS — N1831 Chronic kidney disease, stage 3a: Secondary | ICD-10-CM

## 2024-03-01 DIAGNOSIS — D631 Anemia in chronic kidney disease: Secondary | ICD-10-CM

## 2024-03-01 DIAGNOSIS — E1122 Type 2 diabetes mellitus with diabetic chronic kidney disease: Secondary | ICD-10-CM

## 2024-03-01 DIAGNOSIS — E11621 Type 2 diabetes mellitus with foot ulcer: Secondary | ICD-10-CM | POA: Diagnosis not present

## 2024-03-01 DIAGNOSIS — M19071 Primary osteoarthritis, right ankle and foot: Secondary | ICD-10-CM

## 2024-03-01 DIAGNOSIS — M1030 Gout due to renal impairment, unspecified site: Secondary | ICD-10-CM

## 2024-03-01 DIAGNOSIS — E1140 Type 2 diabetes mellitus with diabetic neuropathy, unspecified: Secondary | ICD-10-CM | POA: Diagnosis not present

## 2024-03-01 DIAGNOSIS — J45909 Unspecified asthma, uncomplicated: Secondary | ICD-10-CM

## 2024-03-01 NOTE — Telephone Encounter (Signed)
 [facility] called and they faxed a diabetic shoe order again because insurance is being particular about the insert code needed for the shoe. They asked if Dr. Claudene could sign it again and if they do not get a response with updated information by 1:00 today, insurance will deny it.

## 2024-03-01 NOTE — Telephone Encounter (Signed)
 Form faxed to [phone] and attached charged form and given to admin to scan/charge

## 2024-03-01 NOTE — Telephone Encounter (Signed)
 Updated form faxed.

## 2024-03-16 ENCOUNTER — Other Ambulatory Visit: Payer: Self-pay | Admitting: Nurse Practitioner

## 2024-03-16 ENCOUNTER — Ambulatory Visit

## 2024-03-16 DIAGNOSIS — E538 Deficiency of other specified B group vitamins: Secondary | ICD-10-CM | POA: Diagnosis not present

## 2024-03-16 MED ORDER — CYANOCOBALAMIN 1000 MCG/ML IJ SOLN
1000.0000 ug | Freq: Once | INTRAMUSCULAR | Status: AC
  Administered 2024-03-16: 1000 ug via INTRAMUSCULAR

## 2024-03-16 NOTE — Progress Notes (Signed)
 Patient was administered a B12 injection into his right deltoid. Patient tolerated the B12 injection well.

## 2024-03-22 ENCOUNTER — Telehealth: Payer: Self-pay | Admitting: *Deleted

## 2024-03-22 NOTE — Telephone Encounter (Signed)
 Patient notified that Patient Assistance Medication are in the office & are ready for pick up.   Medication: FlexTouch Insulin  Pen  Quantity: 3 boxes  Lot# [id number]  Exp: 01/11/2026

## 2024-03-22 NOTE — Telephone Encounter (Signed)
 Copied from CRM [id number]. Topic: General - Other >> Mar 22, 2024 11:06 AM Victoria A wrote: Reason for CRM: Patients wife was returning call to [facility] informed her that medication was ready for pick up. Mrs. Roesler said she would be over shortly .

## 2024-03-23 ENCOUNTER — Other Ambulatory Visit: Payer: Self-pay | Admitting: *Deleted

## 2024-03-24 NOTE — Patient Instructions (Signed)
 Visit Information  Thank you for taking time to visit with me today. Please don't hesitate to contact me if I can be of assistance to you.    Closing From: Complex Care Management.  Please call the care guide team at [phone] if you need to cancel, schedule, or reschedule an appointment.   Please call the Suicide and Crisis Lifeline: 988 call the [place]  National Suicide Prevention Lifeline: 808-123-6457 or TTY: (402)817-0509 TTY 817-709-1953) to talk to a trained counselor call 1-800-273-TALK (toll free, 24 hour hotline) call 911 if you are experiencing a Mental Health or Behavioral Health Crisis or need someone to talk to.   Daun Rens, LCSW [address]  [facility], [facility] Licensed Clinical Social Worker  Direct Dial: [phone]

## 2024-03-24 NOTE — Patient Outreach (Signed)
 Complex Care Management   Visit Note  03/24/2024  Name:  Douglas [name] MRN: [id number] DOB: 1945-10-10  Situation: Referral received for Complex Care Management related to Mental/Behavioral Health diagnosis depression. Patient states that at this time he continues to be coping well.  Mood continues to improve as his medical condition improves I obtained verbal consent from Patient.  Visit completed with Patient  on the phone on 03/23/24.  Background:   Past Medical History:  Diagnosis Date   Arthritis    Depression    Diabetes mellitus with complication (HCC)    Diabetic neuropathy (HCC)    Diastolic dysfunction    a. TTE 07/2017: EF 60-65%, mild concentric LVH, no RWMA, Gr1DD, trivial AI, mildly dilated LA, RVSF normal, PASP normal   Edema    feet/legs   GERD (gastroesophageal reflux disease)    Gout    History of stress test    a. MV 06/2017: small in size, mild in severity, apical anterior and apical defect that was minimally reversible and most likely represented artifact and less likely ischemia/scar, LVEF 55-65%, low risk, probably normal stress test   Hypertension    Hypothyroidism    Kidney cysts    renal failure 2013   Kidney stones    Dr. Waddell   OSA (obstructive sleep apnea)    No oxygen at all in 2-3 years.   [place] spotted fever 12/10/2017   Positve IgG in titer on 11/20/17   S/P cardiac cath 1998   a. no obstructive disease   Syncope and collapse    Temporary low platelet count     Assessment: Patient denied having any community resource/mental health needs at this time. Patient Reported Symptoms:  Cognitive Cognitive Status: Alert and oriented to person, place, and time, Insightful and able to interpret abstract concepts, Normal speech and language skills Cognitive/Intellectual Conditions Management [RPT]: None reported or documented in medical history or problem list   Health Maintenance Behaviors: Annual physical exam Health Facilitated by:  Prayer/meditation, Rest  Neurological Neurological Review of Symptoms: No symptoms reported    HEENT HEENT Symptoms Reported: No symptoms reported      Cardiovascular Cardiovascular Symptoms Reported: No symptoms reported    Respiratory Respiratory Symptoms Reported: No symptoms reported    Endocrine Endocrine Symptoms Reported: Other Other symptoms related to hypoglycemia or hyperglycemia: diabetic nerve pain continues on cymbalta  Is patient diabetic?: Yes Is patient checking blood sugars at home?: Yes List most recent blood sugar readings, include date and time of day: Libre 3 6a 11:30am Endocrine Self-Management Outcome: 4 (good)  Gastrointestinal Gastrointestinal Symptoms Reported: No symptoms reported      Genitourinary Genitourinary Symptoms Reported: No symptoms reported    Integumentary Integumentary Symptoms Reported: No symptoms reported Additional Integumentary Details: Wound on toe has healed, now wearinf diabetic shoes, active with podiatry Skin Management Strategies: Routine screening, Medication therapy Skin Self-Management Outcome: 4 (good)  Musculoskeletal Musculoskelatal Symptoms Reviewed: Unsteady gait Additional Musculoskeletal Details: uses walker., sepcifically when going to MD appointments Musculoskeletal Management Strategies: Medication therapy      Psychosocial Psychosocial Symptoms Reported: No symptoms reported Additional Psychological Details: Denies depressed mood since wound has healed Behavioral Management Strategies: Support system, Medication therapy        03/24/2024    PHQ2-9 Depression Screening   Little interest or pleasure in doing things Not at all  Feeling down, depressed, or hopeless Not at all  PHQ-2 - Total Score 0  Trouble falling or staying asleep, or sleeping too  much    Feeling tired or having little energy    Poor appetite or overeating     Feeling bad about yourself - or that you are a failure or have let yourself or your  family down    Trouble concentrating on things, such as reading the newspaper or watching television    Moving or speaking so slowly that other people could have noticed.  Or the opposite - being so fidgety or restless that you have been moving around a lot more than usual    Thoughts that you would be better off dead, or hurting yourself in some way    PHQ2-9 Total Score    If you checked off any problems, how difficult have these problems made it for you to do your work, take care of things at home, or get along with other people    Depression Interventions/Treatment      There were no vitals filed for this visit.    Medications Reviewed Today     Reviewed by Ermalinda Lenn HERO, LCSW (Social Worker) on 03/23/24 at 1140  Med List Status: <None>   Medication Order Taking? Sig Documenting Provider Last Dose Status Informant  acetaminophen  (TYLENOL ) 500 MG tablet [id number] Yes Take 500 mg by mouth every 6 (six) hours as needed. [provider]  Active Self  aspirin  EC 81 MG tablet [id number] Yes Take 4 tablets (325 mg total) by mouth daily. Wouk, Devaughn Sayres, MD  Active   B Complex Vitamins (BL VITAMIN B COMPLEX PO) [id number] Yes Take 1 tablet by mouth daily. [provider]  Active Self  calcitRIOL  (ROCALTROL ) 0.25 MCG capsule [id number] Yes Take 1 capsule (0.25 mcg total) by mouth daily. Gretel App, NP  Active   carvedilol  (COREG ) 6.25 MG tablet [id number] Yes TAKE 1 TABLET(6.25 MG) BY MOUTH TWICE DAILY WITH A MEAL Gretel App, NP  Active Self  Cholecalciferol  (VITAMIN D3) 50 MCG (2000 UT) TABS [id number] Yes Take 1 tablet by mouth daily. [provider]  Active Self  clove oil liquid [id number] Yes Apply 1 application topically as needed. [provider]  Active Self           Med Note GAYLENE DARVIN JINNY Austin Dec 20, 2023 11:59 PM) PRN  colchicine  0.6 MG tablet [id number] Yes Take 2 tablets by mouth now x 1 dose then take 1 tablet by mouth one hour later Lester,  Kacy, NP  Active Self           Med Note (BEJOS, ANJA J   Sun Dec 20, 2023 11:59 PM) PRN  Continuous Glucose Receiver (FREESTYLE LIBRE 3 READER) DEVI [id number] Yes 1 Device by Does not apply route daily. Check levels once daily Gretel App, NP  Active   Continuous Glucose Sensor (FREESTYLE LIBRE 3 PLUS SENSOR) MISC [id number] Yes Change sensor every 15 days. Gretel App, NP  Active   cyanocobalamin  (VITAMIN B12) 1000 MCG/ML injection [id number] Yes Inject 1,000 mcg into the muscle every 30 (thirty) days. [provider]  Active Self  cyclobenzaprine  (FLEXERIL ) 10 MG tablet [id number] Yes Take 1 tablet (10 mg total) by mouth 3 (three) times daily as needed. Gretel App, NP  Active   dapagliflozin  propanediol ([name] ) 10 MG TABS tablet [id number] Yes Take 1 tablet (10 mg total) by mouth daily. Gretel App, NP  Active Self  doxazosin  (CARDURA ) 4 MG tablet [id number] Yes Take 1 tablet (4 mg total) by mouth daily. Gretel App, NP  Active Self  doxycycline  (ADOXA) 100 MG tablet [id number] Yes Take 100 mg by mouth 2 (two) times daily. [provider]  Active   DULoxetine  (CYMBALTA ) 60 MG capsule [id number] Yes TAKE 1 CAPSULE(60 MG) BY MOUTH DAILY Maribeth Camellia MATSU, MD  Active Self  esomeprazole  (NEXIUM ) 40 MG capsule [id number] Yes Take 1 capsule (40 mg total) by mouth daily at 12 noon. Vannie Delon LABOR, MD  Active Self  furosemide  (LASIX ) 40 MG tablet [id number] Yes TAKE 1 TABLET(40 MG) BY MOUTH DAILY Gretel App, NP  Active   hydroxypropyl methylcellulose / hypromellose (ISOPTO TEARS / GONIOVISC) 2.5 % ophthalmic solution [id number] Yes 1 drop as needed for dry eyes. [provider]  Active Self           Med Note (BEJOS, ANJA J   Mon Dec 21, 2023 12:00 AM) PRN  insulin  degludec ([name]  FLEXTOUCH) 200 UNIT/ML FlexTouch Pen [id number] Yes Inject 44-50 units into the skin once daily as instructed. Gretel App, NP  Active   Insulin  Pen Needle (NOVOFINE PEN NEEDLE) 32G X 6 MM MISC  [id number] Yes 1 each by Does not apply route 2 (two) times daily. Maribeth Camellia MATSU, MD  Active Self  levothyroxine  (SYNTHROID ) 100 MCG tablet [id number] Yes TAKE 1 TABLET(100 MCG) BY MOUTH DAILY BEFORE OFILIA Gretel App, NP  Active Self  loratadine  (CLARITIN ) 10 MG tablet [id number] Yes Take 10 mg by mouth daily as needed for allergies. [provider]  Active Self           Med Note (BEJOS, ANJA J   Mon Dec 21, 2023 12:00 AM) PRN  losartan  (COZAAR ) 25 MG tablet [id number] Yes TAKE 1 TABLET BY MOUTH EVERY MORNING THEN TAKE 2 TABLETS BY MOUTH EVERY KARNA Gretel App, NP  Active   Magnesium  250 MG TABS [id number] Yes Take 250 mg by mouth 2 (two) times daily. [provider]  Active Self  mometasone  (NASONEX ) 50 MCG/ACT nasal spray [id number] Yes Place 2 sprays into the nose daily. Maribeth Camellia MATSU, MD  Active Self  Multiple Vitamins-Minerals (MULTIVITAMIN MEN PO) [id number] Yes Take 1 tablet by mouth daily. [provider]  Active Self  Needle, Disp, (HYPODERMIC NEEDLE 18GX1) 18G X 1 MISC [id number] Yes Use every 14 days to draw up testosterone  [provider]  Active Self  Needle, Disp, (HYPODERMIC NEEDLE 25GX1) 25G X 1 MISC [id number] Yes Use every 14 days to administer testosterone  [provider]  Active Self  pravastatin  (PRAVACHOL ) 20 MG tablet [id number] Yes Take 1 tablet (20 mg total) by mouth daily. Gretel App, NP  Active Self           Med Note (BEJOS, ANJA J   Mon Dec 21, 2023 12:12 AM) Per office visit notes, ot was switched from simvastatin  to pravastatin  due to muscle cramps  pregabalin  (LYRICA ) 200 MG capsule [id number] Yes Take 1 capsule (200 mg total) by mouth 2 (two) times daily. Gretel App, NP  Active   Semaglutide , 2 MG/DOSE, 8 MG/3ML SOPN [id number] Yes Inject 2 mg as directed once a week. Gretel App, NP  Active   tadalafil (CIALIS) 20 MG tablet [id number] Yes Take 20 mg by mouth daily as needed. [provider]  Active  Self  testosterone  cypionate (DEPOTESTOSTERONE CYPIONATE) 200 MG/ML injection [id number] Yes Inject 200 mg into the muscle every 14 (fourteen) days. [provider]  Active Self            Recommendation:   PCP Follow-up Specialty provider follow-up as scheduled  Follow Up Plan:   Closing From:  Complex Care Management-patient confirms having no needs at this time  [employer], LCSW [facility]  [facility] Licensed Clinical Social Worker  Direct Dial: [phone]

## 2024-03-28 ENCOUNTER — Other Ambulatory Visit: Payer: Self-pay | Admitting: Nurse Practitioner

## 2024-03-28 DIAGNOSIS — N1831 Chronic kidney disease, stage 3a: Secondary | ICD-10-CM

## 2024-03-31 ENCOUNTER — Encounter: Payer: Self-pay | Admitting: Nurse Practitioner

## 2024-03-31 ENCOUNTER — Ambulatory Visit: Admitting: Nurse Practitioner

## 2024-03-31 ENCOUNTER — Ambulatory Visit

## 2024-03-31 VITALS — BP 132/84 | HR 65 | Temp 97.8°F | Ht 71.0 in | Wt 254.4 lb

## 2024-03-31 DIAGNOSIS — I480 Paroxysmal atrial fibrillation: Secondary | ICD-10-CM | POA: Diagnosis not present

## 2024-03-31 DIAGNOSIS — L97511 Non-pressure chronic ulcer of other part of right foot limited to breakdown of skin: Secondary | ICD-10-CM

## 2024-03-31 DIAGNOSIS — N401 Enlarged prostate with lower urinary tract symptoms: Secondary | ICD-10-CM

## 2024-03-31 DIAGNOSIS — R350 Frequency of micturition: Secondary | ICD-10-CM | POA: Diagnosis not present

## 2024-03-31 DIAGNOSIS — I10 Essential (primary) hypertension: Secondary | ICD-10-CM

## 2024-03-31 DIAGNOSIS — Z7985 Long-term (current) use of injectable non-insulin antidiabetic drugs: Secondary | ICD-10-CM | POA: Diagnosis not present

## 2024-03-31 DIAGNOSIS — E1142 Type 2 diabetes mellitus with diabetic polyneuropathy: Secondary | ICD-10-CM | POA: Diagnosis not present

## 2024-03-31 LAB — POCT GLYCOSYLATED HEMOGLOBIN (HGB A1C)
HbA1c POC (<> result, manual entry): 6.9 % (ref 4.0–5.6)
HbA1c, POC (controlled diabetic range): 6.9 % (ref 0.0–7.0)
HbA1c, POC (prediabetic range): 6.9 % — AB (ref 5.7–6.4)
Hemoglobin A1C: 6.9 % — AB (ref 4.0–5.6)

## 2024-03-31 NOTE — Progress Notes (Signed)
 " Leron Glance, NP-C Phone: [phone]  Douglas [name] is a 78 y.o. male who presents today for follow up.   Discussed the use of AI scribe software for clinical note transcription with the patient, who gave verbal consent to proceed.  History of Present Illness   Douglas [name] is a 78 year old male who presents with urinary incontinence and A-fib.  He experiences urinary incontinence with difficulty holding urine before reaching the bathroom and increased urinary frequency. The incontinence began after receiving antibiotics and IV fluids during a hospital stay. He continues to use diapers due to the incontinence.  During a past hospital visit, atrial fibrillation was noted, but he has not been formally diagnosed. He sees a cardiologist and recalls a previous echocardiogram being described as 'textbook'. No chest pain, shortness of breath, dizziness, or palpitations, though he sometimes feels fatigued. His mother had atrial fibrillation.  He is under the care of a podiatrist for foot issues and reports significant improvement, stating he almost lost a toe but is now pleased with the care received. He performs daily wound care at home for his foot as needed. He had stopped taking [name]  for approximately two weeks due to concerns about wound healing and then resumed it after observing improvement.  He uses Ozempic  and [name]  for diabetes management and has a blood glucose monitor, though he currently lacks a sensor. He reports occasional low blood sugar readings in the 60s but no excessive thirst or urination beyond the discussed urinary issues.  He underwent a colonoscopy and endoscopy due to unexplained iron deficiency anemia, both of which were normal. Biopsies were taken to rule out celiac disease, which returned normal results. He is not currently taking Nexium  and reports no issues with heartburn or reflux despite eating spicy foods.      Tobacco Use History[1]  Medications  Ordered Prior to Encounter[2]   ROS see history of present illness  Objective  Physical Exam Vitals:   03/31/24 1024  BP: 132/84  Pulse: 65  Temp: 97.8 F (36.6 C)  SpO2: 98%    BP Readings from Last 3 Encounters:  03/31/24 132/84  02/17/24 109/65  02/11/24 118/76   Wt Readings from Last 3 Encounters:  03/31/24 254 lb 6.4 oz (115.4 kg)  02/17/24 253 lb (114.8 kg)  02/11/24 255 lb (115.7 kg)    Physical Exam Constitutional:      General: He is not in acute distress.    Appearance: Normal appearance.  HENT:     Head: Normocephalic.  Cardiovascular:     Rate and Rhythm: Normal rate and regular rhythm.     Heart sounds: Normal heart sounds.  Pulmonary:     Effort: Pulmonary effort is normal.     Breath sounds: Normal breath sounds.  Skin:    General: Skin is warm and dry.  Neurological:     General: No focal deficit present.     Mental Status: He is alert.  Psychiatric:        Mood and Affect: Mood normal.        Behavior: Behavior normal.      Assessment/Plan: Please see individual problem list.  PAF (paroxysmal atrial fibrillation) (HCC) Assessment & Plan: Intermittent atrial fibrillation was noted during hospitalization. Asymptomatic. NSR today. Cardiac monitor ordered in September and was not completed. A previous echocardiogram was normal. Re-order cardiac monitor for two weeks to assess frequency and duration. Encouraged follow up with Cardiology.   Orders: -  LONG TERM MONITOR (3-14 DAYS); Future  Benign prostatic hyperplasia with urinary frequency Assessment & Plan: He experiences urinary frequency and urgency, partially relieved by doxazosin . A follow-up with urology is advised for further management if symptoms persist.   Neuropathic ulcer of toe of right foot, limited to breakdown of skin (HCC) Assessment & Plan: Managed with daily dressing changes and 2 week follow ups with podiatry. Improving. Continue current management and follow up as  scheduled.    DM type 2 with diabetic peripheral neuropathy (HCC) Assessment & Plan: Diabetes is managed with [name] , Ozempic , and [name] . [name]  was resumed after ulcer healing. There is no recent hypoglycemia. POCT A1c today in office with improvement at 6.9. Continue [name] , Ozempic , and [name] .  Orders: -     POCT glycosylated hemoglobin (Hb A1C)  Essential hypertension Assessment & Plan: Blood pressure is well controlled on losartan , carvedilol , and doxazosin . Continue current medication regimen.        Return in about 3 months (around 06/29/2024) for Follow up.   Leron Glance, NP-C [facility] [place]     [1]  Social History Tobacco Use  Smoking Status Never  Smokeless Tobacco Never  [2]  Current Outpatient Medications on File Prior to Visit  Medication Sig Dispense Refill   acetaminophen  (TYLENOL ) 500 MG tablet Take 500 mg by mouth every 6 (six) hours as needed.     aspirin  EC 81 MG tablet Take 4 tablets (325 mg total) by mouth daily.     calcitRIOL  (ROCALTROL ) 0.25 MCG capsule TAKE 1 CAPSULE(0.25 MCG) BY MOUTH DAILY 30 capsule 2   carvedilol  (COREG ) 6.25 MG tablet TAKE 1 TABLET(6.25 MG) BY MOUTH TWICE DAILY WITH A MEAL 180 tablet 2   clove oil liquid Apply 1 application topically as needed.     colchicine  0.6 MG tablet Take 2 tablets by mouth now x 1 dose then take 1 tablet by mouth one hour later 20 tablet 0   Continuous Glucose Receiver (FREESTYLE LIBRE 3 READER) DEVI 1 Device by Does not apply route daily. Check levels once daily 1 each 0   Continuous Glucose Sensor (FREESTYLE LIBRE 3 PLUS SENSOR) MISC Change sensor every 15 days. 2 each 11   cyanocobalamin  (VITAMIN B12) 1000 MCG/ML injection Inject 1,000 mcg into the muscle every 30 (thirty) days.     cyclobenzaprine  (FLEXERIL ) 10 MG tablet Take 1 tablet (10 mg total) by mouth 3 (three) times daily as needed. 30 tablet 5   dapagliflozin  propanediol ([name] ) 10 MG TABS tablet Take 1  tablet (10 mg total) by mouth daily. 90 tablet 3   doxazosin  (CARDURA ) 4 MG tablet Take 1 tablet (4 mg total) by mouth daily. 90 tablet 3   doxycycline  (ADOXA) 100 MG tablet Take 100 mg by mouth 2 (two) times daily.     DULoxetine  (CYMBALTA ) 60 MG capsule TAKE 1 CAPSULE(60 MG) BY MOUTH DAILY 90 capsule 3   esomeprazole  (NEXIUM ) 40 MG capsule Take 1 capsule (40 mg total) by mouth daily at 12 noon. (Patient taking differently: Take 40 mg by mouth as needed.) 90 capsule 3   furosemide  (LASIX ) 40 MG tablet TAKE 1 TABLET(40 MG) BY MOUTH DAILY 90 tablet 3   hydroxypropyl methylcellulose / hypromellose (ISOPTO TEARS / GONIOVISC) 2.5 % ophthalmic solution 1 drop as needed for dry eyes.     insulin  degludec ([name]  FLEXTOUCH) 200 UNIT/ML FlexTouch Pen Inject 44-50 units into the skin once daily as instructed. 15 mL 0   Insulin  Pen Needle (NOVOFINE  PEN NEEDLE) 32G X 6 MM MISC 1 each by Does not apply route 2 (two) times daily. 1 each 3   levothyroxine  (SYNTHROID ) 100 MCG tablet TAKE 1 TABLET(100 MCG) BY MOUTH DAILY BEFORE BREAKFAST 90 tablet 3   loratadine  (CLARITIN ) 10 MG tablet Take 10 mg by mouth daily as needed for allergies.     losartan  (COZAAR ) 25 MG tablet TAKE 1 TABLET BY MOUTH EVERY MORNING THEN TAKE 2 TABLETS BY MOUTH EVERY EVENING 270 tablet 3   Magnesium  250 MG TABS Take 250 mg by mouth 2 (two) times daily.     mometasone  (NASONEX ) 50 MCG/ACT nasal spray Place 2 sprays into the nose daily. 1 each 12   Multiple Vitamins-Minerals (MULTIVITAMIN MEN PO) Take 1 tablet by mouth daily.     Needle, Disp, (HYPODERMIC NEEDLE 18GX1) 18G X 1 MISC Use every 14 days to draw up testosterone      Needle, Disp, (HYPODERMIC NEEDLE 25GX1) 25G X 1 MISC Use every 14 days to administer testosterone      pravastatin  (PRAVACHOL ) 20 MG tablet Take 1 tablet (20 mg total) by mouth daily. 90 tablet 3   pregabalin  (LYRICA ) 200 MG capsule Take 1 capsule (200 mg total) by mouth 2 (two) times daily. 180 capsule 0    Semaglutide , 2 MG/DOSE, 8 MG/3ML SOPN Inject 2 mg as directed once a week.     tadalafil (CIALIS) 20 MG tablet Take 20 mg by mouth daily as needed.     testosterone  cypionate (DEPOTESTOSTERONE CYPIONATE) 200 MG/ML injection Inject 200 mg into the muscle every 14 (fourteen) days.     No current facility-administered medications on file prior to visit.   "

## 2024-04-08 ENCOUNTER — Telehealth: Payer: Self-pay

## 2024-04-08 NOTE — Telephone Encounter (Signed)
 PAP: Patient assistance application for [name]  through [employer] has been mailed to pt's home address on file. Provider portion of application will be faxed to provider's office. Patient portion e-filed.

## 2024-04-08 NOTE — Telephone Encounter (Signed)
 PAP: Application for Douglas [name] has been submitted to [employer], via fax

## 2024-04-08 NOTE — Telephone Encounter (Signed)
 [employer] refill form for [name]   from [place] [place], Cpht placed in provider folder

## 2024-04-11 ENCOUNTER — Encounter: Payer: Self-pay | Admitting: Nurse Practitioner

## 2024-04-11 NOTE — Assessment & Plan Note (Signed)
 Managed with daily dressing changes and 2 week follow ups with podiatry. Improving. Continue current management and follow up as scheduled.

## 2024-04-11 NOTE — Assessment & Plan Note (Signed)
 He experiences urinary frequency and urgency, partially relieved by doxazosin . A follow-up with urology is advised for further management if symptoms persist.

## 2024-04-11 NOTE — Assessment & Plan Note (Signed)
 Blood pressure is well controlled on losartan , carvedilol , and doxazosin . Continue current medication regimen.

## 2024-04-11 NOTE — Assessment & Plan Note (Signed)
 Intermittent atrial fibrillation was noted during hospitalization. Asymptomatic. NSR today. Cardiac monitor ordered in September and was not completed. A previous echocardiogram was normal. Re-order cardiac monitor for two weeks to assess frequency and duration. Encouraged follow up with Cardiology.

## 2024-04-11 NOTE — Assessment & Plan Note (Signed)
 Diabetes is managed with [name] , Ozempic , and [name] . [name]  was resumed after ulcer healing. There is no recent hypoglycemia. POCT A1c today in office with improvement at 6.9. Continue [name] , Ozempic , and [name] .

## 2024-04-12 NOTE — Telephone Encounter (Signed)
 Copied from CRM #[id number]. Topic: General - Other >> Apr 12, 2024 12:46 PM Alexandria E wrote: Reason for CRM: [place] with [employer] called in stating that the patient submitted an application for medication assistance. This form will be getting faxed to the office with the patient's ID listed as [id number].

## 2024-04-18 ENCOUNTER — Ambulatory Visit: Admitting: *Deleted

## 2024-04-18 DIAGNOSIS — E538 Deficiency of other specified B group vitamins: Secondary | ICD-10-CM | POA: Diagnosis not present

## 2024-04-18 MED ORDER — CYANOCOBALAMIN 1000 MCG/ML IJ SOLN
1000.0000 ug | Freq: Once | INTRAMUSCULAR | Status: AC
  Administered 2024-04-18: 1000 ug via INTRAMUSCULAR

## 2024-04-18 NOTE — Progress Notes (Cosign Needed Addendum)
 Using two identifiers, pt received B12 injection in left deltoid muscle. Pt tolerated it well with no complaints or concerns.

## 2024-04-19 NOTE — Telephone Encounter (Signed)
Form placed in provider " to be signed" folder

## 2024-04-19 NOTE — Progress Notes (Signed)
"  Sent, thanks   "

## 2024-04-20 NOTE — Progress Notes (Signed)
 " [name] [address] [place] [address] Phone: [phone] Subjective:   Douglas [name], am serving as a scribe for Dr. Arthea Claudene.  I'm seeing this patient by the request  of:  Gretel App, NP  CC: Left shoulder and bilateral knee pain  Douglas [name]  02/11/2024 Bilateral injections given today.  Tolerated procedure well.  Hopeful that this will make significant difference for some time again.  Discussed can repeat again in 10 to 12 weeks if needed.  Patient is a diabetic and does have diabetic peripheral neuropathy that is concerning.  Does have some breakdown of the skin on the left big toe that needs to be monitored as well.  Being followed up still by podiatry and wound care for his contralateral foot.  Follow-up with me again for the knees in 10 to 12 weeks     Update 04/21/2024 Douglas [name] is a 79 y.o. male coming in with complaint of L shoulder and B knee pain. Patient states that his foot has healed. Wearing diabetic shoes.   Shoulder pain has improved.   B knees are painful.    Reviewing patient's difficulty patient has had more trouble with his polyneuropathy seen family medicine and podiatry.    Past Medical History:  Diagnosis Date   Arthritis    Depression    Diabetes mellitus with complication (HCC)    Diabetic neuropathy (HCC)    Diastolic dysfunction    a. TTE 07/2017: EF 60-65%, mild concentric LVH, no RWMA, Gr1DD, trivial AI, mildly dilated LA, RVSF normal, PASP normal   Edema    feet/legs   GERD (gastroesophageal reflux disease)    Gout    History of stress test    a. MV 06/2017: small in size, mild in severity, apical anterior and apical defect that was minimally reversible and most likely represented artifact and less likely ischemia/scar, LVEF 55-65%, low risk, probably normal stress test   Hypertension    Hypothyroidism    Kidney cysts    renal failure 2013   Kidney stones    Dr. Waddell   OSA  (obstructive sleep apnea)    No oxygen at all in 2-3 years.   [place] spotted fever 12/10/2017   Positve IgG in titer on 11/20/17   S/P cardiac cath 1998   a. no obstructive disease   Syncope and collapse    Temporary low platelet count    Past Surgical History:  Procedure Laterality Date   ANAL FISSURE REPAIR     BACK SURGERY  1977   rupture disc lumbar spine   CARDIAC CATHETERIZATION  1998   [facility] with [facility].    CATARACT EXTRACTION W/PHACO Right 07/16/2016   Procedure: CATARACT EXTRACTION PHACO AND INTRAOCULAR LENS PLACEMENT (IOC);  Surgeon: Steven Dingeldein, MD;  Location: [facility] ORS;  Service: Ophthalmology;  Laterality: Right;  US  01:11 AP% 17.6 CDE 24.83 fluid pack lot # [id number]   CATARACT EXTRACTION W/PHACO Left 08/13/2016   Procedure: CATARACT EXTRACTION PHACO AND INTRAOCULAR LENS PLACEMENT (IOC) suture placed in left eye at end of procedure;  Surgeon: Steven Dingeldein, MD;  Location: [facility] ORS;  Service: Ophthalmology;  Laterality: Left;  US  01:55 AP% 22.7 CDE 53.56 fluid pack lot # [id number]   FINGER AMPUTATION     partial   FINGER ARTHROPLASTY     KNEE SURGERY  1993   arthroscopy   SHOULDER SURGERY Bilateral 1998   arthroscopic right, rotator cuff repair  left   Social History   Socioeconomic History   Marital status: Married    Spouse name: Virginia    Number of children: 3   Years of education: 13   Highest education level: Not on file  Occupational History    Comment: retired  Tobacco Use   Smoking status: Never   Smokeless tobacco: Never  Vaping Use   Vaping status: Never Used  Substance and Sexual Activity   Alcohol  use: Not Currently   Drug use: No   Sexual activity: Yes  Other Topics Concern   Not on file  Social History Narrative   Lives in [place] with his wife ([place]ia ). No children. Three step children. No pets.      Work - Patient is retired. Maintenance supervisor.      School - One year  college education.      Right handed.      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               Social Drivers of Health   Tobacco Use: Low Risk (04/11/2024)   Patient History    Smoking Tobacco Use: Never    Smokeless Tobacco Use: Never    Passive Exposure: Not on file  Financial Resource Strain: High Risk (12/29/2023)   Received from [facility]   Overall Financial Resource Strain (CARDIA)    Difficulty of Paying Living Expenses: Hard  Food Insecurity: No Food Insecurity (01/28/2024)   Epic    Worried About Radiation Protection Practitioner of Food in the Last Year: Never true    Ran Out of Food in the Last Year: Never true  Transportation Needs: No Transportation Needs (01/28/2024)   Epic    Lack of Transportation (Medical): No    Lack of Transportation (Non-Medical): No  Physical Activity: Inactive (11/09/2023)   Exercise Vital Sign    Days of Exercise per Week: 0 days    Minutes of Exercise per Session: 0 min  Stress: No Stress Concern Present (11/09/2023)   [employer] of Occupational Health - Occupational Stress Questionnaire    Feeling of Stress: Not at all  Social Connections: Moderately Integrated (12/21/2023)   Social Connection and Isolation Panel    Frequency of Communication with Friends and Family: More than three times a week    Frequency of Social Gatherings with Friends and Family: More than three times a week    Attends Religious Services: More than 4 times per year    Active Member of Clubs or Organizations: No    Attends Banker Meetings: Never    Marital Status: Married  Depression (PHQ2-9): Low Risk (03/31/2024)   Depression (PHQ2-9)    PHQ-2 Score: 0  Alcohol  Screen: Low Risk (11/09/2023)   Alcohol  Screen    Last Alcohol  Screening Score (AUDIT): 0  Housing: Low Risk  (03/28/2024)   Received from [facility]   Epic    In the last 12 months, was there a time when you were not able to pay the mortgage  or rent on time?: No    In the past 12 months, how many times have you moved where you were living?: 0    At any time in the past 12 months, were you homeless or living in a shelter (including now)?: No  Utilities: Not At Risk (01/28/2024)   Epic    Threatened with loss of utilities: No  Recent Concern: Utilities - At Risk (12/29/2023)   Received  from [facility]   Epic    In the past 12 months has the electric, gas, oil, or water company threatened to shut off services in your home?: Yes  Health Literacy: Adequate Health Literacy (11/09/2023)   B1300 Health Literacy    Frequency of need for help with medical instructions: Never   Allergies[1] Family History  Problem Relation Age of Onset   Breast cancer Mother    Lung cancer Mother    Bone cancer Mother    Heart Problems Mother    Cancer Mother        breast, lung and rib   AAA (abdominal aortic aneurysm) Mother    Heart disease Mother    Heart attack Father    Heart disease Father    Cancer Brother        esophageal   Heart attack Brother    Colon cancer Neg Hx    Liver disease Neg Hx     Current Outpatient Medications (Endocrine & Metabolic):    calcitRIOL  (ROCALTROL ) 0.25 MCG capsule, TAKE 1 CAPSULE(0.25 MCG) BY MOUTH DAILY   dapagliflozin  propanediol ([name] ) 10 MG TABS tablet, Take 1 tablet (10 mg total) by mouth daily.   insulin  degludec ([name]  FLEXTOUCH) 200 UNIT/ML FlexTouch Pen, Inject 44-50 units into the skin once daily as instructed.   levothyroxine  (SYNTHROID ) 100 MCG tablet, TAKE 1 TABLET(100 MCG) BY MOUTH DAILY BEFORE BREAKFAST   Semaglutide , 2 MG/DOSE, 8 MG/3ML SOPN, Inject 2 mg as directed once a week.   testosterone  cypionate (DEPOTESTOSTERONE CYPIONATE) 200 MG/ML injection, Inject 200 mg into the muscle every 14 (fourteen) days.  Current Outpatient Medications (Cardiovascular):    carvedilol  (COREG ) 6.25 MG tablet, TAKE 1 TABLET(6.25 MG) BY MOUTH TWICE DAILY WITH A MEAL   doxazosin   (CARDURA ) 4 MG tablet, Take 1 tablet (4 mg total) by mouth daily.   furosemide  (LASIX ) 40 MG tablet, TAKE 1 TABLET(40 MG) BY MOUTH DAILY   losartan  (COZAAR ) 25 MG tablet, TAKE 1 TABLET BY MOUTH EVERY MORNING THEN TAKE 2 TABLETS BY MOUTH EVERY EVENING   pravastatin  (PRAVACHOL ) 20 MG tablet, Take 1 tablet (20 mg total) by mouth daily.   tadalafil (CIALIS) 20 MG tablet, Take 20 mg by mouth daily as needed.  Current Outpatient Medications (Respiratory):    loratadine  (CLARITIN ) 10 MG tablet, Take 10 mg by mouth daily as needed for allergies.   mometasone  (NASONEX ) 50 MCG/ACT nasal spray, Place 2 sprays into the nose daily.  Current Outpatient Medications (Analgesics):    acetaminophen  (TYLENOL ) 500 MG tablet, Take 500 mg by mouth every 6 (six) hours as needed.   aspirin  EC 81 MG tablet, Take 4 tablets (325 mg total) by mouth daily.   colchicine  0.6 MG tablet, Take 2 tablets by mouth now x 1 dose then take 1 tablet by mouth one hour later  Current Outpatient Medications (Hematological):    cyanocobalamin  (VITAMIN B12) 1000 MCG/ML injection, Inject 1,000 mcg into the muscle every 30 (thirty) days.  Current Outpatient Medications (Other):    clove oil liquid, Apply 1 application topically as needed.   Continuous Glucose Receiver (FREESTYLE LIBRE 3 READER) DEVI, 1 Device by Does not apply route daily. Check levels once daily   Continuous Glucose Sensor (FREESTYLE LIBRE 3 PLUS SENSOR) MISC, Change sensor every 15 days.   cyclobenzaprine  (FLEXERIL ) 10 MG tablet, Take 1 tablet (10 mg total) by mouth 3 (three) times daily as needed.   doxycycline  (ADOXA) 100 MG tablet, Take 100 mg by mouth  2 (two) times daily.   DULoxetine  (CYMBALTA ) 60 MG capsule, TAKE 1 CAPSULE(60 MG) BY MOUTH DAILY   esomeprazole  (NEXIUM ) 40 MG capsule, Take 1 capsule (40 mg total) by mouth daily at 12 noon. (Patient taking differently: Take 40 mg by mouth as needed.)   hydroxypropyl methylcellulose / hypromellose (ISOPTO TEARS /  GONIOVISC) 2.5 % ophthalmic solution, 1 drop as needed for dry eyes.   Insulin  Pen Needle (NOVOFINE PEN NEEDLE) 32G X 6 MM MISC, 1 each by Does not apply route 2 (two) times daily.   Magnesium  250 MG TABS, Take 250 mg by mouth 2 (two) times daily.   Multiple Vitamins-Minerals (MULTIVITAMIN MEN PO), Take 1 tablet by mouth daily.   Needle, Disp, (HYPODERMIC NEEDLE 18GX1) 18G X 1 MISC, Use every 14 days to draw up testosterone    Needle, Disp, (HYPODERMIC NEEDLE 25GX1) 25G X 1 MISC, Use every 14 days to administer testosterone    pregabalin  (LYRICA ) 200 MG capsule, Take 1 capsule (200 mg total) by mouth 2 (two) times daily.   Reviewed prior external information including notes and imaging from  primary care provider As well as notes that were available from care everywhere and other healthcare systems.  Past medical history, social, surgical and family history all reviewed in electronic medical record.  No pertanent information unless stated regarding to the chief complaint.   Review of Systems:  No headache, visual changes, nausea, vomiting, diarrhea, constipation, dizziness, abdominal pain, skin rash, fevers, chills, night sweats, weight loss, swollen lymph nodes, body aches, joint swelling, chest pain, shortness of breath, mood changes. POSITIVE muscle aches  Objective  Blood pressure 132/82, pulse 75, height 5' 11 (1.803 m), weight 256 lb (116.1 kg), SpO2 98%.   General: No apparent distress alert and oriented x3 mood and affect normal, dressed appropriately.  HEENT: Pupils equal, extraocular movements intact  Respiratory: Patient's speak in full sentences and does not appear short of breath  Cardiovascular: No lower extremity edema, non tender, no erythema  Left shoulder exam shows  Bilateral knee exam shows significant arthritic changes noted.  Some crepitus noted.  Instability with valgus and varus force.  Tender to palpation diffusely on the knees   After informed written and  verbal consent, patient was seated on exam table. Right knee was prepped with alcohol  swab and utilizing anterolateral approach, patient's right knee space was injected with 4:1  marcaine  0.5%: Kenalog 40mg /dL. Patient tolerated the procedure well without immediate complications.  After informed written and verbal consent, patient was seated on exam table. Left knee was prepped with alcohol  swab and utilizing anterolateral approach, patient's left knee space was injected with 4:1  marcaine  0.5%: Kenalog 40mg /dL. Patient tolerated the procedure well without immediate complications.   Impression and Recommendations:    The above documentation has been reviewed and is accurate and complete Arthea CHRISTELLA Sharps, DO        [1]  Allergies Allergen Reactions   Bee Venom Swelling   Benadryl [Diphenhydramine Hcl (Sleep)] Other (See Comments)    Hyperactivity    Enalapril Itching    itching   Gabapentin Palpitations and Rash    rash   Hctz [Hydrochlorothiazide] Other (See Comments)    Decreased potassium   Omeprazole Other (See Comments)    Other reaction(s): increased sx on high dose   "

## 2024-04-21 ENCOUNTER — Encounter: Payer: Self-pay | Admitting: Family Medicine

## 2024-04-21 ENCOUNTER — Other Ambulatory Visit: Payer: Self-pay

## 2024-04-21 ENCOUNTER — Ambulatory Visit: Admitting: Family Medicine

## 2024-04-21 ENCOUNTER — Telehealth: Payer: Self-pay

## 2024-04-21 VITALS — BP 132/82 | HR 75 | Ht 71.0 in | Wt 256.0 lb

## 2024-04-21 DIAGNOSIS — M170 Bilateral primary osteoarthritis of knee: Secondary | ICD-10-CM | POA: Diagnosis not present

## 2024-04-21 DIAGNOSIS — G8929 Other chronic pain: Secondary | ICD-10-CM

## 2024-04-21 NOTE — Patient Instructions (Addendum)
 Injected both knees today Read about PRP Will get gel approval See me again in 10-12 weeks

## 2024-04-21 NOTE — Telephone Encounter (Signed)
-----   Message from Berwyn Posey sent at 04/21/2024 11:24 AM EST ----- Regarding: visco Can you please run patient for B knee visco injections?  Thanks

## 2024-04-21 NOTE — Telephone Encounter (Signed)
 Mon[employer]isc benefits ran for bilateral knee Case [phone]

## 2024-04-21 NOTE — Assessment & Plan Note (Signed)
 Given injection again today, with discussed with patient that we will consider the possibility of viscosupplementation.  In addition to this discussed the possibility of PRP.  Encourage patient to continue to stay active.  Likely patient is doing well with his weight and still has a BMI around 35 if any surgical intervention is necessary.  Discussed which activities to do and which ones to avoid.  Increase activity slowly.  Follow-up again in 8 to 12 weeks

## 2024-04-26 NOTE — Telephone Encounter (Signed)
 Scheduled 06/30/24  MONOVISC authorized bilateral knee NO PRE CERT REQUIRED  Copay $25 Deductible does not apply OOP MAX $3900 has met $0 Once OOP has been met coverage goes to 100% Reference # [id number]

## 2024-04-26 NOTE — Telephone Encounter (Signed)
 Noted

## 2024-04-27 NOTE — Telephone Encounter (Signed)
 PAP: Patient assistance application for [name]  has been approved by PAP Companies: [employer] from 05/06/2024 to 05/01/2025. Medication should be delivered to PAP Delivery: Provider's office. For further shipping updates, please contact [employer] at [phone]. Patient ID is: [id number]

## 2024-04-28 ENCOUNTER — Other Ambulatory Visit: Payer: Self-pay | Admitting: Nurse Practitioner

## 2024-04-28 ENCOUNTER — Telehealth: Payer: Self-pay | Admitting: Nurse Practitioner

## 2024-04-28 ENCOUNTER — Ambulatory Visit: Payer: Self-pay | Admitting: Nurse Practitioner

## 2024-04-28 DIAGNOSIS — E1142 Type 2 diabetes mellitus with diabetic polyneuropathy: Secondary | ICD-10-CM

## 2024-04-28 NOTE — Telephone Encounter (Signed)
 FYI Only or Action Required?: Action required by provider: medication refill request and update on patient condition.  Patient was last seen in primary care on 03/31/2024 by Douglas App, NP.  Called Nurse Triage reporting Back Pain.  Symptoms began several days ago.  Interventions attempted: OTC medications: tylenol .  Symptoms are: unchanged.  Triage Disposition: See PCP When Office is Open (Within 3 Days)  Patient/caregiver understands and will follow disposition?: Yes    Reason for Disposition  [1] MODERATE back pain (e.g., interferes with normal activities) AND [2] present > 3 days  Answer Assessment - Initial Assessment Questions Triage encounter created from med req.    Returned pt's call to f/u on Norco request. Pt states that he just saw PCP, 03/31/24 and didn't ask for a refill of Norco at the time for neck/back pain. Pt states he has an elderly dog that he has been lifting and carrying d/t decline. Pt states dog rolled off the couch and pt now has kink in his neck and pain in his lower back. Pt has tried tylenol  but no relief at this time. Pt requesting norco PRN for pain while he navigates what to do with his dog. Pt denies any personal distress.     1. ONSET: When did the pain begin? (e.g., minutes, hours, days)     A couple days ago   2. LOCATION: Where does it hurt? (upper, mid or lower back)     Lower back/ neck   3. SEVERITY: How bad is the pain?  (e.g., Scale 1-10; mild, moderate, or severe)     Moderate   4. PATTERN: Is the pain constant? (e.g., yes, no; constant, intermittent)      Comes and goes   5. RADIATION: Does the pain shoot into your legs or somewhere else?     No   6. CAUSE:  What do you think is causing the back pain?      Pt reports having an elderly dog that he is having to lift and carry. States that dog rolled off the couch and pt got a kink in his neck.   7. BACK OVERUSE:  Any recent lifting of heavy objects, strenuous work  or exercise?     Yes; pt reports lifting his elderly dog   8. MEDICINES: What have you taken so far for the pain? (e.g., nothing, acetaminophen , NSAIDS)     Tylenol    9. NEUROLOGIC SYMPTOMS: Do you have any weakness, numbness, or problems with bowel/bladder control?     No   10. OTHER SYMPTOMS: Do you have any other symptoms? (e.g., fever, abdomen pain, burning with urination, blood in urine)       No  Protocols used: Back Pain-A-AH

## 2024-04-28 NOTE — Telephone Encounter (Signed)
 Pt triaged in alternative triage note. Will route med request to clinic.      HYDROcodone -acetaminophen  (NORCO/VICODIN) 5-325 MG tablet : Take 1 tablet by mouth every 6 (six) hours as needed for moderate pain. Last filled 08/12/2021    Copied from CRM #[id number]. Topic: Clinical - Medication Refill >> Apr 28, 2024  1:13 PM Alfonso ORN wrote: Medication: hydrocodone   Has the patient contacted their pharmacy? No   This is the patient's preferred pharmacy:  [id number] [place], [place] - [address] AT [facility] OF [address] [place] [address] Phone: [phone] Fax: [phone]   Is this the correct pharmacy for this prescription? Yes If no, delete pharmacy and type the correct one.   Has the prescription been filled recently? No  Is the patient out of the medication? Yes  Has the patient been seen for an appointment in the last year OR does the patient have an upcoming appointment? Yes  Can we respond through MyChart? No  .

## 2024-04-28 NOTE — Telephone Encounter (Signed)
 Copied from CRM [id number]. Topic: Clinical - Medication Refill >> Apr 28, 2024  1:10 PM Alfonso ORN wrote: Medication:  insulin  degludec ([name]  FLEXTOUCH) 200 UNIT/ML   Has the patient contacted their pharmacy? No   This is the patient's preferred pharmacy:    [facility] - [place], [place] - [address] [place] [address] Phone: [phone] Fax: [phone]    Is this the correct pharmacy for this prescription? Yes If no, delete pharmacy and type the correct one.   Has the prescription been filled recently? Yes, a month ago Is the patient out of the medication? Yes  Has the patient been seen for an appointment in the last year OR does the patient have an upcoming appointment? Yes  Can we respond through MyChart? No

## 2024-05-05 ENCOUNTER — Other Ambulatory Visit: Payer: Self-pay | Admitting: Nurse Practitioner

## 2024-05-05 NOTE — Telephone Encounter (Signed)
 Copied from CRM #[id number]. Topic: Clinical - Medication Refill >> Apr 28, 2024  1:13 PM Alfonso ORN wrote: Medication: hydrocodone   Has the patient contacted their pharmacy? No   This is the patient's preferred pharmacy:  [id number] [place], [place] - [address] AT [facility] OF [address] [place] [address] Phone: [phone] Fax: [phone]   Is this the correct pharmacy for this prescription? Yes If no, delete pharmacy and type the correct one.   Has the prescription been filled recently? No  Is the patient out of the medication? Yes  Has the patient been seen for an appointment in the last year OR does the patient have an upcoming appointment? Yes  Can we respond through MyChart? No  . >> May 05, 2024 10:38 AM Antony RAMAN wrote: He is calling again for this its been past 72 business hours, please reach out to the patient with an update [phone]

## 2024-05-05 NOTE — Telephone Encounter (Signed)
 Called and informed pt got pt scheduled with Narendra on next Wednesday

## 2024-05-11 ENCOUNTER — Encounter: Payer: Self-pay | Admitting: Internal Medicine

## 2024-05-11 ENCOUNTER — Ambulatory Visit: Payer: Self-pay | Admitting: Internal Medicine

## 2024-05-11 ENCOUNTER — Ambulatory Visit
Admission: RE | Admit: 2024-05-11 | Discharge: 2024-05-11 | Disposition: A | Source: Ambulatory Visit | Attending: Internal Medicine

## 2024-05-11 ENCOUNTER — Ambulatory Visit: Admitting: Internal Medicine

## 2024-05-11 ENCOUNTER — Ambulatory Visit
Admission: RE | Admit: 2024-05-11 | Discharge: 2024-05-11 | Disposition: A | Discharge status: Home / Self Care | Attending: Internal Medicine | Admitting: Internal Medicine

## 2024-05-11 VITALS — BP 126/82 | HR 85 | Temp 97.8°F | Ht 71.0 in | Wt 250.6 lb

## 2024-05-11 DIAGNOSIS — N3941 Urge incontinence: Secondary | ICD-10-CM | POA: Diagnosis not present

## 2024-05-11 DIAGNOSIS — I10 Essential (primary) hypertension: Secondary | ICD-10-CM | POA: Insufficient documentation

## 2024-05-11 DIAGNOSIS — K5900 Constipation, unspecified: Secondary | ICD-10-CM | POA: Insufficient documentation

## 2024-05-11 DIAGNOSIS — R32 Unspecified urinary incontinence: Secondary | ICD-10-CM | POA: Insufficient documentation

## 2024-05-11 DIAGNOSIS — M549 Dorsalgia, unspecified: Secondary | ICD-10-CM | POA: Insufficient documentation

## 2024-05-11 DIAGNOSIS — R0789 Other chest pain: Secondary | ICD-10-CM | POA: Diagnosis not present

## 2024-05-11 DIAGNOSIS — G8929 Other chronic pain: Secondary | ICD-10-CM | POA: Insufficient documentation

## 2024-05-11 DIAGNOSIS — M62830 Muscle spasm of back: Secondary | ICD-10-CM | POA: Insufficient documentation

## 2024-05-11 DIAGNOSIS — W19XXXA Unspecified fall, initial encounter: Secondary | ICD-10-CM

## 2024-05-11 DIAGNOSIS — M545 Low back pain, unspecified: Secondary | ICD-10-CM | POA: Diagnosis not present

## 2024-05-11 DIAGNOSIS — R634 Abnormal weight loss: Secondary | ICD-10-CM | POA: Diagnosis not present

## 2024-05-11 DIAGNOSIS — R2689 Other abnormalities of gait and mobility: Secondary | ICD-10-CM | POA: Insufficient documentation

## 2024-05-11 DIAGNOSIS — S0993XA Unspecified injury of face, initial encounter: Secondary | ICD-10-CM | POA: Insufficient documentation

## 2024-05-11 DIAGNOSIS — M419 Scoliosis, unspecified: Secondary | ICD-10-CM | POA: Insufficient documentation

## 2024-05-11 DIAGNOSIS — L089 Local infection of the skin and subcutaneous tissue, unspecified: Secondary | ICD-10-CM | POA: Diagnosis not present

## 2024-05-11 DIAGNOSIS — E1142 Type 2 diabetes mellitus with diabetic polyneuropathy: Secondary | ICD-10-CM | POA: Insufficient documentation

## 2024-05-11 DIAGNOSIS — M47816 Spondylosis without myelopathy or radiculopathy, lumbar region: Secondary | ICD-10-CM | POA: Insufficient documentation

## 2024-05-11 DIAGNOSIS — S0240DA Maxillary fracture, left side, initial encounter for closed fracture: Secondary | ICD-10-CM | POA: Diagnosis not present

## 2024-05-11 DIAGNOSIS — R202 Paresthesia of skin: Secondary | ICD-10-CM | POA: Diagnosis not present

## 2024-05-11 DIAGNOSIS — Z760 Encounter for issue of repeat prescription: Secondary | ICD-10-CM | POA: Insufficient documentation

## 2024-05-11 MED ORDER — CYCLOBENZAPRINE HCL 10 MG PO TABS: 10.0000 mg | ORAL_TABLET | Freq: Three times a day (TID) | ORAL | 5 refills | Status: AC | PRN

## 2024-05-11 NOTE — Assessment & Plan Note (Signed)
-   This progress chronic and stable -Blood pressure today is at goal (126/82) -Will continue with losartan , carvedilol  and doxazosin  -Patient has a follow-up appointment with his PCP next month -No further workup at this time

## 2024-05-11 NOTE — Progress Notes (Signed)
 "  Acute Office Visit  Subjective:     Patient ID: Douglas [name], male    DOB: 06-06-1945, 79 y.o.   MRN: [id number]  Chief Complaint  Patient presents with   Back Pain    Back Pain 5/10 Low back pain into bilateral hips and down to bilateral knees    Discussed the use of AI scribe software for clinical note transcription with the patient, who gave verbal consent to proceed.  History of Present Illness Douglas [name] is a 79 year old male with chronic lower back pain who presents for medication refill and pain management.  Chronic lower back pain - Chronic pain localized to the lower back, radiating to the posterior thighs bilaterally, stopping at the knees - Pain started recurring more consistently in December, intermittent in nature - Exacerbations associated with increased activity and adverse weather conditions - Previous steroid injections provided immediate relief; last injection approximately one year ago - Uses Tylenol  for pain relief; hydrocodone  as a fallback when these are ineffective - Flexeril  used for back spasms, reducing pain from 8/10 to 5/10  Peripheral neuropathy - Tingling in bilateral big toes and hands - Attributed to diabetes - Lack of sensation in toes  Postoperative foot care - Underwent foot surgery approximately three months after antibiotic treatment for foot infection - Surgery performed by Dr. Greig Blush - Wounds have healed well - Continues to use prescribed cream to soften skin on foot  Urinary incontinence - Onset since hospital discharge in September - Wears a diaper for incontinence - Experiences sudden urge to urinate and inability to stop flow once started  Constipation - Constipation present since hospital discharge - Uses Miralax  for management  Weight loss and appetite changes - Weight loss attributed to Ozempic  use, immobility during hospital stay, and decreased appetite following loss of his dog - Snacks during the  day, causing fluctuations in blood glucose levels  Gait instability and fall - Fall approximately two weeks ago resulting in facial and knee injuries - Loss of consciousness during fall; found by wife - Uses walker for stability but not inside the house - Has not regained full balance since toe surgery - Reports instability and lack of sensation in toes    Review of Systems  Constitutional: Negative.   HENT: Negative.    Respiratory: Negative.    Cardiovascular: Negative.   Gastrointestinal: Negative.   Genitourinary:        Complains of urge incontinence since September.  Has yet to follow-up with his urologist  Musculoskeletal:  Positive for back pain and falls.  Neurological:        Recent mechanical fall with LOC 2 weeks ago but no residual concussion symptoms  Chronic peripheral neuropathy with decreased sensation over his big toes as well as in his fingers  Psychiatric/Behavioral: Negative.          Objective:    BP 126/82   Pulse 85   Temp 97.8 F (36.6 C)   Ht 5' 11 (1.803 m)   Wt 250 lb 9.6 oz (113.7 kg)   SpO2 94%   BMI 34.95 kg/m    Physical Exam Constitutional:      Appearance: Normal appearance.  HENT:     Head: Normocephalic and atraumatic.  Cardiovascular:     Rate and Rhythm: Normal rate and regular rhythm.     Heart sounds: Normal heart sounds.  Pulmonary:     Effort: Pulmonary effort is normal.     Breath sounds:  Normal breath sounds. No wheezing, rhonchi or rales.  Abdominal:     General: Bowel sounds are normal. There is no distension.     Palpations: Abdomen is soft.     Tenderness: There is no abdominal tenderness. There is no guarding or rebound.  Musculoskeletal:        General: No swelling or tenderness.     Right lower leg: No edema.     Left lower leg: No edema.     Comments: Patient with tightness and mild tenderness to palpation over his right lower back and the paraspinal region  Skin:    Comments: Right big toe surgical  incision is healed well  Neurological:     Mental Status: He is alert.     Sensory: Sensory deficit present.     Comments: Power 5 out of 5 in bilateral lower extremities at the hips and the knees Sensation is diminished over toes bilaterally as well as medial dorsal aspect of bilateral feet  Psychiatric:        Mood and Affect: Mood normal.        Behavior: Behavior normal.     No results found for any visits on 05/11/24.      Assessment & Plan:   Problem List Items Addressed This Visit       Cardiovascular and Mediastinum   Essential hypertension - Primary (Chronic)   - This progress chronic and stable -Blood pressure today is at goal (126/82) -Will continue with losartan , carvedilol  and doxazosin  -Patient has a follow-up appointment with his PCP next month -No further workup at this time        Endocrine   Diabetic neuropathy (HCC)   - This problem is chronic and stable -On exam, patient does have decreased sensation over bilateral big toes and the medial aspect of the dorsum of his feet bilaterally (L5 dermatome) -Patient denies any change in his neuropathy symptoms -He is compliant with his Lyrica  -No further workup at this time        Other   Chronic back pain greater than 3 months duration   - Patient complained of chronic back pain with recent exacerbation since December -Pain begins in his lower back and radiates down both legs to the back with knees -No red flag symptoms at this time (does have urinary incontinence but this is appears to be chronic as well as peripheral neuropathy secondary to his diabetes which is unchanged and weight loss with his Ozempic  but no recent drastic weight change) -On exam, patient has tenderness on his right lower back to the paraspinal region with some muscle tightness noted on exam -Will refill Flexeril  for the patient and advised conservative measures like muscle balm (Tiger balm or Biofreeze) -Advised patient that  hydrocodone  can increase his risk for falls and would avoid at this time especially in conjunction with his Flexeril  -Referral to PT placed for his chronic back pain to see if this will help alleviate his symptoms -Will also make an appointment with his pain management specialist Dr. Lazarus for another spinal injection (last 1 was a year ago) -X-ray of lumbar spine was ordered as well to rule out fracture as an acute cause of his pain especially given recent fall -Return precautions discussed with the patient -No further workup at this time      Relevant Medications   cyclobenzaprine  (FLEXERIL ) 10 MG tablet   Other Relevant Orders   Ambulatory referral to Physical Therapy   DG Lumbar Spine Complete  Fall   - Patient complains of a fall 2 weeks ago after getting his legs tangled in his dog's leash -States that he fell down and briefly lost consciousness for this found by his wife -Denies any postconcussive symptoms -Does complain of tenderness over his left maxillary region and is concerned for possible fracture -On exam, patient does have some mild to moderate tenderness over his left maxilla -Will obtain CT maxillofacial for further evaluation -No further workup at this time      Relevant Orders   Ambulatory referral to Physical Therapy   CT MAXILLOFACIAL WO CONTRAST   Urinary incontinence   - Patient complains of urinary incontinence symptoms since September and wears a diaper for this -States he does have a urologist (Dr. Twylla) and that he has yet to follow-up with -His symptoms are consistent with urge incontinence may be secondary to his prostate -No relation to the onset of his back pain -Will have patient follow-up with his urologist (referral placed to Dr. Twylla) -No further workup at this time      Relevant Orders   Ambulatory referral to Urology    Meds ordered this encounter  Medications   cyclobenzaprine  (FLEXERIL ) 10 MG tablet    Sig: Take 1 tablet (10 mg  total) by mouth 3 (three) times daily as needed.    Dispense:  30 tablet    Refill:  5    No follow-ups on file.  Lynnea Vandervoort, MD   "

## 2024-05-11 NOTE — Assessment & Plan Note (Signed)
-   This problem is chronic and stable -On exam, patient does have decreased sensation over bilateral big toes and the medial aspect of the dorsum of his feet bilaterally (L5 dermatome) -Patient denies any change in his neuropathy symptoms -He is compliant with his Lyrica  -No further workup at this time

## 2024-05-11 NOTE — Patient Instructions (Addendum)
" °  VISIT SUMMARY: During your visit, we discussed your chronic lower back pain, peripheral neuropathy, postoperative foot care, urinary incontinence, constipation and recent fall. We reviewed your current medications and made adjustments to better manage your symptoms.  YOUR PLAN: -CHRONIC LOW BACK PAIN WITH MUSCLE SPASM: Chronic low back pain with muscle spasm is ongoing pain in the lower back often accompanied by muscle tightness. We refilled your Flexeril  for muscle spasms, ordered a lumbar spine x-ray to rule out fractures, recommended physical therapy, and advised using topical analgesics like Tiger Balm. We also advised against using hydrocodone  due to the increased risk of falls.  -URINARY INCONTINENCE: Urinary incontinence is the loss of bladder control, leading to sudden and uncontrollable urination. We referred you to urologist Dr. Twylla for further evaluation and management.  -TYPE 2 DIABETES MELLITUS WITH DIABETIC POLYNEUROPATHY: Diabetic polyneuropathy is nerve damage caused by diabetes, leading to decreased sensation in your toes and hands. There are no new symptoms since your back pain started, and the sensation deficits are consistent with long-standing diabetes.  -HISTORY OF FOOT INFECTION, POST-SURGICAL: You are recovering well from your foot surgery with no signs of infection. Continue using the prescribed cream to soften the skin on your foot and follow up with your podiatrist as needed.  -RECENT FALL WITH FACIAL AND BILATERAL KNEE CONTUSIONS: You experienced a fall resulting in facial and knee injuries. We ordered an x-ray of your facial area to check for fractures and advised on fall prevention strategies, including using a walker for stability.  -CONSTIPATION: Constipation is difficulty in passing stools. Continue using Miralax  to manage your constipation.  INSTRUCTIONS: Please follow up with the urologist Dr. Twylla for your urinary incontinence. Continue using Miralax  for  constipation and the prescribed cream for your foot. Attend physical therapy for your back pain and use a walker to prevent falls. We will review the results of your lumbar spine x-ray and facial CTduring your next visit.    Contains text generated by [employer].   "

## 2024-05-11 NOTE — Assessment & Plan Note (Signed)
-   Patient complains of urinary incontinence symptoms since September and wears a diaper for this -States he does have a urologist (Dr. Twylla) and that he has yet to follow-up with -His symptoms are consistent with urge incontinence may be secondary to his prostate -No relation to the onset of his back pain -Will have patient follow-up with his urologist (referral placed to Dr. Twylla) -No further workup at this time

## 2024-05-11 NOTE — Assessment & Plan Note (Signed)
-   Patient complains of a fall 2 weeks ago after getting his legs tangled in his dog's leash -States that he fell down and briefly lost consciousness for this found by his wife -Denies any postconcussive symptoms -Does complain of tenderness over his left maxillary region and is concerned for possible fracture -On exam, patient does have some mild to moderate tenderness over his left maxilla -Will obtain CT maxillofacial for further evaluation -No further workup at this time

## 2024-05-11 NOTE — Assessment & Plan Note (Addendum)
-   Patient complained of chronic back pain with recent exacerbation since December -Pain begins in his lower back and radiates down both legs to the back with knees -No red flag symptoms at this time (does have urinary incontinence but this is appears to be chronic as well as peripheral neuropathy secondary to his diabetes which is unchanged and weight loss with his Ozempic  but no recent drastic weight change) -On exam, patient has tenderness on his right lower back to the paraspinal region with some muscle tightness noted on exam -Will refill Flexeril  for the patient and advised conservative measures like muscle balm (Tiger balm or Biofreeze) -Advised patient that hydrocodone  can increase his risk for falls and would avoid at this time especially in conjunction with his Flexeril  -Referral to PT placed for his chronic back pain to see if this will help alleviate his symptoms -Will also make an appointment with his pain management specialist Dr. Lazarus for another spinal injection (last 1 was a year ago) -X-ray of lumbar spine was ordered as well to rule out fracture as an acute cause of his pain especially given recent fall -Return precautions discussed with the patient -No further workup at this time

## 2024-05-12 ENCOUNTER — Emergency Department: Admission: EM | Admit: 2024-05-12 | Discharge: 2024-05-12 | Disposition: A | Discharge status: Home / Self Care

## 2024-05-12 ENCOUNTER — Other Ambulatory Visit: Payer: Self-pay

## 2024-05-12 DIAGNOSIS — E119 Type 2 diabetes mellitus without complications: Secondary | ICD-10-CM | POA: Insufficient documentation

## 2024-05-12 DIAGNOSIS — S50311D Abrasion of right elbow, subsequent encounter: Secondary | ICD-10-CM | POA: Insufficient documentation

## 2024-05-12 DIAGNOSIS — W19XXXD Unspecified fall, subsequent encounter: Secondary | ICD-10-CM | POA: Insufficient documentation

## 2024-05-12 DIAGNOSIS — S80212D Abrasion, left knee, subsequent encounter: Secondary | ICD-10-CM | POA: Insufficient documentation

## 2024-05-12 DIAGNOSIS — S80211D Abrasion, right knee, subsequent encounter: Secondary | ICD-10-CM | POA: Diagnosis not present

## 2024-05-12 DIAGNOSIS — I10 Essential (primary) hypertension: Secondary | ICD-10-CM | POA: Diagnosis not present

## 2024-05-12 DIAGNOSIS — S0240DD Maxillary fracture, left side, subsequent encounter for fracture with routine healing: Secondary | ICD-10-CM | POA: Insufficient documentation

## 2024-05-12 DIAGNOSIS — S50312D Abrasion of left elbow, subsequent encounter: Secondary | ICD-10-CM | POA: Insufficient documentation

## 2024-05-12 DIAGNOSIS — S0993XD Unspecified injury of face, subsequent encounter: Secondary | ICD-10-CM | POA: Diagnosis present

## 2024-05-12 NOTE — Discharge Instructions (Signed)
 Try not to blow your nose forcefully.  Purchase over-the-counter nasal saline and use this at least 4 times daily in both nostrils.  Follow-up in the next week or 2 with an oral maxillary facial surgeon.  Return with any acutely worsening symptoms or any other emergency -- RETURN PRECAUTIONS & AFTERCARE: (ENGLISH) RETURN PRECAUTIONS: Return immediately to the emergency department or see/call your doctor if you feel worse, weak or have changes in speech or vision, are short of breath, have fever, vomiting, pain, bleeding or dark stool, trouble urinating or any new issues. Return here or see/call your doctor if not improving as expected for your suspected condition. FOLLOW-UP CARE: Call your doctor and/or any doctors we referred you to for more advice and to make an appointment. Do this today, tomorrow or after the weekend. Some doctors only take PPO insurance so if you have HMO insurance you may want to contact your HMO or your regular doctor for referral to a specialist within your plan. Either way tell the doctor's office that it was a referral from the emergency department so you get the soonest possible appointment.  YOUR TEST RESULTS: Take result reports of any blood or urine tests, imaging tests and EKG's to your doctor and any referral doctor. Have any abnormal tests repeated. Your doctor or a referral doctor can let you know when this should be done. Also make sure your doctor contacts this hospital to get any test results that are not currently available such as cultures or special tests for infection and final imaging reports, which are often not available at the time you leave the ER but which may list additional important findings that are not documented on the preliminary report. BLOOD PRESSURE: If your blood pressure was greater than 120/80 have your blood pressure rechecked within 1 to 2 weeks. MEDICATION SIDE EFFECTS: Do not drive, walk, bike, take the bus, etc. if you have received or are  being prescribed any sedating medications such as those for pain or anxiety or certain antihistamines like Benadryl. If you have been give one of these here get a taxi home or have a friend drive you home. Ask your pharmacist to counsel you on potential side effects of any new medication

## 2024-05-12 NOTE — ED Provider Notes (Signed)
 "  [facility] Provider Note    Event Date/Time   First MD Initiated Contact with Patient 05/12/24 1141     (approximate)   History   +Fracture   HPI  Douglas [name] is a 79 y.o. male with a past medical history of hypertension, diabetic neuropathy, back pain known urinary incontinence who ambulates with a walker at baseline who presents with concern for left maxillary facial trauma after a fall 2 weeks ago.  Patient reports that he had a mechanical fall 2 weeks ago when he got tangled in his dog's leash and he fell forward.  He denies any loss of consciousness and is not on any blood thinners.  He denies any headache and otherwise feels well.  He does report some nasal congestion on the left nostril but denies any bleeding since the initial incident.  He was seen by his primary care physician yesterday and a CT maxillofacial face was ordered which demonstrated a left displaced fracture of the anterior wall of the left maxillary sinus with some surrounding soft tissue swelling.  He was told to come to our emergency department for evaluation afterwards.  Patient presents with a family member and they both state that he is otherwise well and has no pain over his face currently and does not know why he was sent to the emergency department      Physical Exam   Triage Vital Signs: ED Triage Vitals  Encounter Vitals Group     BP 05/12/24 1126 119/77     Girls Systolic BP Percentile --      Girls Diastolic BP Percentile --      Boys Systolic BP Percentile --      Boys Diastolic BP Percentile --      Pulse Rate 05/12/24 1126 89     Resp 05/12/24 1126 18     Temp 05/12/24 1126 98 F (36.7 C)     Temp src --      SpO2 05/12/24 1126 97 %     Weight --      Height --      Head Circumference --      Peak Flow --      Pain Score 05/12/24 1125 4     Pain Loc --      Pain Education --      Exclude from Growth Chart --     Most recent vital signs: Vitals:    05/12/24 1126  BP: 119/77  Pulse: 89  Resp: 18  Temp: 98 F (36.7 C)  SpO2: 97%    Nursing Triage Note reviewed. Vital signs reviewed and patients oxygen saturation is normoxic  General: Patient is well nourished, well developed, awake and alert, resting comfortably in no acute distress Head: Normocephalic and atraumatic, no scalp lacerations or abrasions Eyes: Normal inspection, extraocular muscles intact, no conjunctival pallor Ear, nose, throat: Normal external exam No obvious facial edema or swelling and no erythema.  He does have some septal deviation on the left but no evidence of epistaxis, TMs clear bilaterally Cranial nerves V and VII intact bilaterally Neck: Normal range of motion, no C-spine tenderness to palpation Respiratory: Patient is in no respiratory distress, lungs CTAB No chest wall tenderness to palpation Cardiovascular: Patient is not tachycardic, RRR without murmur appreciated GI: Abd SNT with no guarding or rebound  Back: Normal inspection of the back with good strength and range of motion throughout all ext Extremities: pulses intact with good cap refills,  no LE pitting edema or calf tenderness Neuro: The patient is alert and oriented to person, place, and time, appropriately conversive, with 5/5 bilat UE/LE strength, no gross motor or sensory defects noted. Coordination appears to be adequate. Skin: Does have some abrasions on knees and elbows Psych: normal mood and affect, no SI or HI  ED Results / Procedures / Treatments   Labs (all labs ordered are listed, but only abnormal results are displayed) Labs Reviewed - No data to display   EKG None  RADIOLOGY I reviewed the CT face done yesterday and appreciate the left maxillary sinus fracture with no other abnormalities on my independent review interpretation    PROCEDURES:  Critical Care performed: No  Procedures   MEDICATIONS ORDERED IN ED: Medications - No data to display   IMPRESSION  / MDM / ASSESSMENT AND PLAN / ED COURSE                                Differential diagnosis includes, but is not limited to, facial fracture, less likely sinusitis, contusion  ED course: Patient is very well-appearing.  He does have a displaced fracture of his maxillary sinus but cranial nerves V and VII intact bilaterally.  I did consider ordering the patient antibiotics for sinus coverage however patient tells me that the incident in question that caused this occurred greater than 2 weeks ago and at this time I do not think they are indicated as he is already healed.  Patient counseled on sinus precautions and he will pick up over-the-counter nasal saline.  Unfortunately at our facility we do not have facial trauma coverage.  He was given the number to [facility] and he will call nonurgently and make an appointment as I do not think his injuries are surgical by any means.  He will continue his regular medication follow-up with his primary care physician     -- Risk: 3 This patient has a high risk of morbidity due to further diagnostic testing or treatment. Rationale: This patients evaluation and management involve a high risk of morbidity due to the potential severity of presenting symptoms, need for diagnostic testing, and/or initiation of treatment that may require close monitoring. The differential includes conditions with potential for significant deterioration or requiring escalation of care. Treatment decisions in the ED, including medication administration, procedural interventions, or disposition planning, reflect this level of risk. COPA: 5 The patient has the following acute or chronic illness/injury that poses a possible threat to life or bodily function: [X] : The patient has a potentially serious acute condition or an acute exacerbation of a chronic illness requiring urgent evaluation and management in the Emergency Department. The clinical presentation necessitates immediate  consideration of life-threatening or function-threatening diagnoses, even if they are ultimately ruled out.   FINAL CLINICAL IMPRESSION(S) / ED DIAGNOSES   Final diagnoses:  Closed fracture of left side of maxilla with routine healing, subsequent encounter     Rx / DC Orders   ED Discharge Orders     None        Note:  This document was prepared using Dragon voice recognition software and may include unintentional dictation errors.   Nicholaus Rolland BRAVO, MD 05/12/24 1347  "

## 2024-05-12 NOTE — ED Triage Notes (Addendum)
 Pt comes with acute maxillary fracture. Pt had fall  two week ago and was seen at his pcp yesterday bc he was out of his meds. Pt then had xray done of his back and face. Pt was told he needed to come here. Pt not on thinners. Pt states he just has abrasions to elbow and knees now.    Scan shows:Displaced fracture of the anterior wall of the left maxillary sinus with adjacent fluid and soft tissue swelling.

## 2024-05-13 ENCOUNTER — Telehealth: Payer: Self-pay | Admitting: Urology

## 2024-05-13 NOTE — Telephone Encounter (Signed)
 The patient reports ongoing concerns with incontinence and is requesting that a prescription be sent to his pharmacy. Our office has received a referral for the patient for evaluation of urge incontinence. The patient would also like guidance on whether he should schedule an appointment with Dr. Gaston. Please advise.

## 2024-05-16 ENCOUNTER — Telehealth: Payer: Self-pay

## 2024-05-16 ENCOUNTER — Other Ambulatory Visit (HOSPITAL_COMMUNITY): Payer: Self-pay

## 2024-05-17 ENCOUNTER — Telehealth: Payer: Self-pay

## 2024-05-17 ENCOUNTER — Other Ambulatory Visit (HOSPITAL_COMMUNITY): Payer: Self-pay

## 2024-05-17 NOTE — Telephone Encounter (Signed)
 Called and informed pt.

## 2024-05-17 NOTE — Telephone Encounter (Signed)
 Pharmacy Patient Advocate Encounter  Received notification from [facility] [employer] that Prior Authorization for Cyclobenzaprine  HCl 10MG  tablets  has been APPROVED from 05/16/2024 to 06/13/2024. Ran test claim, Copay is $0.19  QTY OF 30 TABLETS SEE TEST CLAIM BELOW. This test claim was processed through [facility]- copay amounts may vary at other pharmacies due to pharmacy/plan contracts, or as the patient moves through the different stages of their insurance plan.   PA #/Case ID/Reference #: [id number]

## 2024-05-17 NOTE — Telephone Encounter (Signed)
 Detailed vm left informing pt that his pt assistance for [name]  is ready for pick up    E2C2 PLEASE RELAY THAT PATIENT ASSISTANCE MEDS ARE READY FOR PICK UP AT OUR OFFICE  MED: [name]  Qty: 4 boxes EXP: 01-11-26 LOT: [id number]

## 2024-05-18 ENCOUNTER — Other Ambulatory Visit: Payer: Self-pay

## 2024-05-18 MED ORDER — DULOXETINE HCL 60 MG PO CPEP: 60.0000 mg | ORAL_CAPSULE | Freq: Every day | ORAL | 3 refills | Status: AC

## 2024-05-23 ENCOUNTER — Ambulatory Visit

## 2024-06-14 ENCOUNTER — Ambulatory Visit: Admitting: Nurse Practitioner

## 2024-06-30 ENCOUNTER — Ambulatory Visit: Admitting: Family Medicine

## 2024-07-01 ENCOUNTER — Ambulatory Visit: Admitting: Nurse Practitioner

## 2024-11-14 ENCOUNTER — Ambulatory Visit

## 2025-02-03 ENCOUNTER — Ambulatory Visit

## 2025-02-17 ENCOUNTER — Ambulatory Visit: Admitting: Urology
